# Patient Record
Sex: Female | Born: 1950
Health system: Southern US, Community
[De-identification: ages and names within clinical notes are randomized; demographics above are authoritative.]

## PROBLEM LIST (undated history)

## (undated) DIAGNOSIS — J439 Emphysema, unspecified: Secondary | ICD-10-CM

## (undated) DIAGNOSIS — K219 Gastro-esophageal reflux disease without esophagitis: Secondary | ICD-10-CM

## (undated) DIAGNOSIS — E538 Deficiency of other specified B group vitamins: Secondary | ICD-10-CM

## (undated) DIAGNOSIS — K635 Polyp of colon: Secondary | ICD-10-CM

## (undated) DIAGNOSIS — K5792 Diverticulitis of intestine, part unspecified, without perforation or abscess without bleeding: Secondary | ICD-10-CM

## (undated) DIAGNOSIS — R0902 Hypoxemia: Secondary | ICD-10-CM

## (undated) DIAGNOSIS — K5790 Diverticulosis of intestine, part unspecified, without perforation or abscess without bleeding: Secondary | ICD-10-CM

## (undated) DIAGNOSIS — J40 Bronchitis, not specified as acute or chronic: Secondary | ICD-10-CM

## (undated) DIAGNOSIS — K8020 Calculus of gallbladder without cholecystitis without obstruction: Secondary | ICD-10-CM

## (undated) DIAGNOSIS — E7800 Pure hypercholesterolemia, unspecified: Secondary | ICD-10-CM

## (undated) DIAGNOSIS — C801 Malignant (primary) neoplasm, unspecified: Secondary | ICD-10-CM

## (undated) DIAGNOSIS — E785 Hyperlipidemia, unspecified: Secondary | ICD-10-CM

## (undated) DIAGNOSIS — T7840XA Allergy, unspecified, initial encounter: Secondary | ICD-10-CM

## (undated) DIAGNOSIS — K644 Residual hemorrhoidal skin tags: Secondary | ICD-10-CM

## (undated) DIAGNOSIS — J189 Pneumonia, unspecified organism: Secondary | ICD-10-CM

## (undated) DIAGNOSIS — I351 Nonrheumatic aortic (valve) insufficiency: Secondary | ICD-10-CM

## (undated) DIAGNOSIS — R42 Dizziness and giddiness: Secondary | ICD-10-CM

## (undated) DIAGNOSIS — I2510 Atherosclerotic heart disease of native coronary artery without angina pectoris: Secondary | ICD-10-CM

## (undated) DIAGNOSIS — Z720 Tobacco use: Secondary | ICD-10-CM

## (undated) DIAGNOSIS — T4145XA Adverse effect of unspecified anesthetic, initial encounter: Secondary | ICD-10-CM

## (undated) DIAGNOSIS — K649 Unspecified hemorrhoids: Secondary | ICD-10-CM

## (undated) DIAGNOSIS — H269 Unspecified cataract: Secondary | ICD-10-CM

## (undated) DIAGNOSIS — E049 Nontoxic goiter, unspecified: Secondary | ICD-10-CM

## (undated) DIAGNOSIS — Z5189 Encounter for other specified aftercare: Secondary | ICD-10-CM

## (undated) DIAGNOSIS — IMO0001 Reserved for inherently not codable concepts without codable children: Secondary | ICD-10-CM

## (undated) DIAGNOSIS — J449 Chronic obstructive pulmonary disease, unspecified: Secondary | ICD-10-CM

## (undated) DIAGNOSIS — D649 Anemia, unspecified: Secondary | ICD-10-CM

## (undated) DIAGNOSIS — E611 Iron deficiency: Secondary | ICD-10-CM

## (undated) DIAGNOSIS — M810 Age-related osteoporosis without current pathological fracture: Secondary | ICD-10-CM

## (undated) DIAGNOSIS — I700 Atherosclerosis of aorta: Secondary | ICD-10-CM

## (undated) DIAGNOSIS — T8859XA Other complications of anesthesia, initial encounter: Secondary | ICD-10-CM

## (undated) HISTORY — DX: Unspecified cataract: H26.9

## (undated) HISTORY — DX: Residual hemorrhoidal skin tags: K64.4

## (undated) HISTORY — DX: Allergy, unspecified, initial encounter: T78.40XA

## (undated) HISTORY — PX: WISDOM TOOTH EXTRACTION: SHX21

## (undated) HISTORY — DX: Diverticulosis of intestine, part unspecified, without perforation or abscess without bleeding: K57.90

## (undated) HISTORY — DX: Age-related osteoporosis without current pathological fracture: M81.0

## (undated) HISTORY — PX: APPENDECTOMY: SHX54

## (undated) HISTORY — DX: Gastro-esophageal reflux disease without esophagitis: K21.9

## (undated) HISTORY — DX: Atherosclerosis of aorta: I70.0

## (undated) HISTORY — DX: Nontoxic goiter, unspecified: E04.9

## (undated) HISTORY — DX: Nonrheumatic aortic (valve) insufficiency: I35.1

## (undated) HISTORY — DX: Deficiency of other specified B group vitamins: E53.8

## (undated) HISTORY — DX: Anemia, unspecified: D64.9

## (undated) HISTORY — DX: Hyperlipidemia, unspecified: E78.5

## (undated) HISTORY — DX: Encounter for other specified aftercare: Z51.89

## (undated) HISTORY — PX: NASAL SEPTUM SURGERY: SHX37

## (undated) HISTORY — PX: DENTAL SURGERY: SHX609

## (undated) HISTORY — DX: Calculus of gallbladder without cholecystitis without obstruction: K80.20

## (undated) HISTORY — DX: Iron deficiency: E61.1

## (undated) HISTORY — PX: ABDOMINAL HYSTERECTOMY: SHX81

## (undated) HISTORY — PX: BIOPSY THYROID: PRO38

## (undated) HISTORY — DX: Hypoxemia: R09.02

## (undated) HISTORY — DX: Emphysema, unspecified: J43.9

## (undated) HISTORY — DX: Tobacco use: Z72.0

## (undated) HISTORY — DX: Atherosclerotic heart disease of native coronary artery without angina pectoris: I25.10

## (undated) HISTORY — DX: Malignant (primary) neoplasm, unspecified: C80.1

## (undated) HISTORY — PX: TUBAL LIGATION: SHX77

---

## 1974-10-08 HISTORY — PX: PARTIAL HYSTERECTOMY: SHX80

## 1997-10-08 DIAGNOSIS — E049 Nontoxic goiter, unspecified: Secondary | ICD-10-CM | POA: Insufficient documentation

## 1999-10-09 HISTORY — PX: BILATERAL OOPHORECTOMY: SHX1221

## 2007-08-15 ENCOUNTER — Emergency Department (HOSPITAL_COMMUNITY): Admission: EM | Admit: 2007-08-15 | Discharge: 2007-08-15 | Payer: Self-pay | Admitting: Emergency Medicine

## 2007-08-20 ENCOUNTER — Ambulatory Visit (HOSPITAL_COMMUNITY): Admission: RE | Admit: 2007-08-20 | Discharge: 2007-08-20 | Payer: Self-pay | Admitting: Orthopaedic Surgery

## 2008-03-09 ENCOUNTER — Emergency Department (HOSPITAL_BASED_OUTPATIENT_CLINIC_OR_DEPARTMENT_OTHER): Admission: EM | Admit: 2008-03-09 | Discharge: 2008-03-09 | Payer: Self-pay | Admitting: Emergency Medicine

## 2008-09-07 ENCOUNTER — Ambulatory Visit (HOSPITAL_COMMUNITY): Admission: RE | Admit: 2008-09-07 | Discharge: 2008-09-07 | Payer: Self-pay | Admitting: Internal Medicine

## 2008-09-07 ENCOUNTER — Ambulatory Visit: Payer: Self-pay | Admitting: Internal Medicine

## 2008-09-07 DIAGNOSIS — J449 Chronic obstructive pulmonary disease, unspecified: Secondary | ICD-10-CM | POA: Insufficient documentation

## 2008-09-07 DIAGNOSIS — F17200 Nicotine dependence, unspecified, uncomplicated: Secondary | ICD-10-CM | POA: Insufficient documentation

## 2008-09-07 DIAGNOSIS — R1012 Left upper quadrant pain: Secondary | ICD-10-CM | POA: Insufficient documentation

## 2008-09-07 LAB — CONVERTED CEMR LAB
CO2: 25 meq/L (ref 19–32)
Calcium: 9.6 mg/dL (ref 8.4–10.5)
Chloride: 105 meq/L (ref 96–112)
Creatinine, Ser: 0.55 mg/dL (ref 0.40–1.20)
Eosinophils Relative: 3 % (ref 0–5)
Glucose, Bld: 84 mg/dL (ref 70–99)
HCT: 43.6 % (ref 36.0–46.0)
Hemoglobin: 14.6 g/dL (ref 12.0–15.0)
Lymphocytes Relative: 35 % (ref 12–46)
Lymphs Abs: 2.9 10*3/uL (ref 0.7–4.0)
Monocytes Absolute: 0.7 10*3/uL (ref 0.1–1.0)
Sodium: 141 meq/L (ref 135–145)
Total Bilirubin: 0.4 mg/dL (ref 0.3–1.2)
Total Protein: 7.2 g/dL (ref 6.0–8.3)
WBC: 8.2 10*3/uL (ref 4.0–10.5)

## 2008-09-13 ENCOUNTER — Telehealth (INDEPENDENT_AMBULATORY_CARE_PROVIDER_SITE_OTHER): Payer: Self-pay | Admitting: Internal Medicine

## 2008-09-23 ENCOUNTER — Encounter (INDEPENDENT_AMBULATORY_CARE_PROVIDER_SITE_OTHER): Payer: Self-pay | Admitting: Internal Medicine

## 2008-10-22 ENCOUNTER — Ambulatory Visit: Payer: Self-pay | Admitting: Diagnostic Radiology

## 2008-10-22 ENCOUNTER — Emergency Department (HOSPITAL_BASED_OUTPATIENT_CLINIC_OR_DEPARTMENT_OTHER): Admission: EM | Admit: 2008-10-22 | Discharge: 2008-10-22 | Payer: Self-pay | Admitting: Emergency Medicine

## 2009-01-21 ENCOUNTER — Ambulatory Visit: Payer: Self-pay | Admitting: Internal Medicine

## 2009-01-21 DIAGNOSIS — IMO0002 Reserved for concepts with insufficient information to code with codable children: Secondary | ICD-10-CM | POA: Insufficient documentation

## 2009-01-21 LAB — CONVERTED CEMR LAB
Bilirubin Urine: NEGATIVE
Glucose, Urine, Semiquant: NEGATIVE
Protein, U semiquant: NEGATIVE
Specific Gravity, Urine: <1.005
pH: 7

## 2009-01-27 ENCOUNTER — Encounter (INDEPENDENT_AMBULATORY_CARE_PROVIDER_SITE_OTHER): Payer: Self-pay | Admitting: Internal Medicine

## 2009-01-27 ENCOUNTER — Encounter: Admission: RE | Admit: 2009-01-27 | Discharge: 2009-01-27 | Payer: Self-pay | Admitting: Internal Medicine

## 2009-01-30 DIAGNOSIS — M810 Age-related osteoporosis without current pathological fracture: Secondary | ICD-10-CM | POA: Insufficient documentation

## 2009-01-30 LAB — CONVERTED CEMR LAB
Cholesterol: 274 mg/dL — ABNORMAL HIGH (ref 0–200)
HDL: 50 mg/dL (ref >39)
LDL Cholesterol: 167 mg/dL — ABNORMAL HIGH (ref 0–99)
Triglycerides: 284 mg/dL — ABNORMAL HIGH (ref <150)

## 2009-02-03 ENCOUNTER — Encounter (INDEPENDENT_AMBULATORY_CARE_PROVIDER_SITE_OTHER): Payer: Self-pay | Admitting: Internal Medicine

## 2009-02-22 ENCOUNTER — Ambulatory Visit: Payer: Self-pay | Admitting: Internal Medicine

## 2009-02-25 ENCOUNTER — Ambulatory Visit: Payer: Self-pay | Admitting: Internal Medicine

## 2009-02-25 DIAGNOSIS — K219 Gastro-esophageal reflux disease without esophagitis: Secondary | ICD-10-CM | POA: Insufficient documentation

## 2009-02-25 DIAGNOSIS — E785 Hyperlipidemia, unspecified: Secondary | ICD-10-CM | POA: Insufficient documentation

## 2009-03-08 ENCOUNTER — Ambulatory Visit: Payer: Self-pay | Admitting: *Deleted

## 2009-03-10 ENCOUNTER — Encounter (INDEPENDENT_AMBULATORY_CARE_PROVIDER_SITE_OTHER): Payer: Self-pay | Admitting: Internal Medicine

## 2009-06-17 ENCOUNTER — Ambulatory Visit: Payer: Self-pay | Admitting: Internal Medicine

## 2009-07-06 LAB — CONVERTED CEMR LAB
Cholesterol: 255 mg/dL — ABNORMAL HIGH
HDL: 42 mg/dL
LDL Cholesterol: 138 mg/dL — ABNORMAL HIGH
Total CHOL/HDL Ratio: 6.1
Triglycerides: 373 mg/dL — ABNORMAL HIGH
VLDL: 75 mg/dL — ABNORMAL HIGH

## 2009-07-25 ENCOUNTER — Telehealth (INDEPENDENT_AMBULATORY_CARE_PROVIDER_SITE_OTHER): Payer: Self-pay | Admitting: Internal Medicine

## 2009-08-04 ENCOUNTER — Ambulatory Visit: Payer: Self-pay | Admitting: Internal Medicine

## 2009-08-10 LAB — CONVERTED CEMR LAB: Vit D, 25-Hydroxy: 23 ng/mL — ABNORMAL LOW (ref 30–89)

## 2009-10-05 ENCOUNTER — Ambulatory Visit: Payer: Self-pay | Admitting: Internal Medicine

## 2009-10-09 ENCOUNTER — Ambulatory Visit: Payer: Self-pay | Admitting: Diagnostic Radiology

## 2009-10-09 ENCOUNTER — Emergency Department (HOSPITAL_BASED_OUTPATIENT_CLINIC_OR_DEPARTMENT_OTHER): Admission: EM | Admit: 2009-10-09 | Discharge: 2009-10-09 | Payer: Self-pay | Admitting: Emergency Medicine

## 2009-11-04 LAB — CONVERTED CEMR LAB: LDL Cholesterol: 150 mg/dL — ABNORMAL HIGH (ref 0–99)

## 2009-11-09 ENCOUNTER — Ambulatory Visit: Payer: Self-pay | Admitting: Internal Medicine

## 2009-11-09 DIAGNOSIS — F3289 Other specified depressive episodes: Secondary | ICD-10-CM | POA: Insufficient documentation

## 2009-11-09 DIAGNOSIS — F329 Major depressive disorder, single episode, unspecified: Secondary | ICD-10-CM | POA: Insufficient documentation

## 2009-11-09 DIAGNOSIS — E041 Nontoxic single thyroid nodule: Secondary | ICD-10-CM | POA: Insufficient documentation

## 2009-11-09 DIAGNOSIS — R059 Cough, unspecified: Secondary | ICD-10-CM | POA: Insufficient documentation

## 2009-11-09 DIAGNOSIS — R05 Cough: Secondary | ICD-10-CM

## 2009-11-09 LAB — CONVERTED CEMR LAB: TSH: 0.575 microintl units/mL (ref 0.350–4.500)

## 2009-11-11 ENCOUNTER — Ambulatory Visit (HOSPITAL_COMMUNITY): Admission: RE | Admit: 2009-11-11 | Discharge: 2009-11-11 | Payer: Self-pay | Admitting: Internal Medicine

## 2009-11-24 ENCOUNTER — Telehealth (INDEPENDENT_AMBULATORY_CARE_PROVIDER_SITE_OTHER): Payer: Self-pay | Admitting: Internal Medicine

## 2009-11-29 ENCOUNTER — Ambulatory Visit: Payer: Self-pay | Admitting: Internal Medicine

## 2009-12-07 ENCOUNTER — Telehealth (INDEPENDENT_AMBULATORY_CARE_PROVIDER_SITE_OTHER): Payer: Self-pay | Admitting: Internal Medicine

## 2009-12-16 ENCOUNTER — Ambulatory Visit (HOSPITAL_COMMUNITY): Admission: RE | Admit: 2009-12-16 | Discharge: 2009-12-16 | Payer: Self-pay | Admitting: Internal Medicine

## 2009-12-16 ENCOUNTER — Encounter (INDEPENDENT_AMBULATORY_CARE_PROVIDER_SITE_OTHER): Payer: Self-pay | Admitting: Internal Medicine

## 2009-12-22 ENCOUNTER — Telehealth (INDEPENDENT_AMBULATORY_CARE_PROVIDER_SITE_OTHER): Payer: Self-pay | Admitting: Internal Medicine

## 2010-01-19 ENCOUNTER — Emergency Department (HOSPITAL_BASED_OUTPATIENT_CLINIC_OR_DEPARTMENT_OTHER): Admission: EM | Admit: 2010-01-19 | Discharge: 2010-01-19 | Payer: Self-pay | Admitting: Emergency Medicine

## 2010-01-19 ENCOUNTER — Ambulatory Visit: Payer: Self-pay | Admitting: Diagnostic Radiology

## 2010-02-08 ENCOUNTER — Ambulatory Visit: Payer: Self-pay | Admitting: Internal Medicine

## 2010-02-08 LAB — CONVERTED CEMR LAB
Cholesterol: 258 mg/dL — ABNORMAL HIGH (ref 0–200)
HDL: 38 mg/dL — ABNORMAL LOW (ref >39)
Total CHOL/HDL Ratio: 6.8
Triglycerides: 355 mg/dL — ABNORMAL HIGH (ref <150)

## 2010-03-02 ENCOUNTER — Ambulatory Visit: Payer: Self-pay | Admitting: Internal Medicine

## 2010-03-02 DIAGNOSIS — E559 Vitamin D deficiency, unspecified: Secondary | ICD-10-CM | POA: Insufficient documentation

## 2010-03-02 DIAGNOSIS — M545 Low back pain, unspecified: Secondary | ICD-10-CM | POA: Insufficient documentation

## 2010-03-02 DIAGNOSIS — J309 Allergic rhinitis, unspecified: Secondary | ICD-10-CM | POA: Insufficient documentation

## 2010-03-02 DIAGNOSIS — M76899 Other specified enthesopathies of unspecified lower limb, excluding foot: Secondary | ICD-10-CM | POA: Insufficient documentation

## 2010-03-02 LAB — CONVERTED CEMR LAB: Vit D, 25-Hydroxy: 36 ng/mL (ref 30–89)

## 2010-03-06 ENCOUNTER — Encounter (INDEPENDENT_AMBULATORY_CARE_PROVIDER_SITE_OTHER): Payer: Self-pay | Admitting: Internal Medicine

## 2010-03-08 ENCOUNTER — Encounter (INDEPENDENT_AMBULATORY_CARE_PROVIDER_SITE_OTHER): Payer: Self-pay | Admitting: Internal Medicine

## 2010-03-21 ENCOUNTER — Encounter: Admission: RE | Admit: 2010-03-21 | Discharge: 2010-04-27 | Payer: Self-pay | Admitting: Internal Medicine

## 2010-03-21 ENCOUNTER — Telehealth (INDEPENDENT_AMBULATORY_CARE_PROVIDER_SITE_OTHER): Payer: Self-pay | Admitting: Internal Medicine

## 2010-03-23 ENCOUNTER — Encounter (INDEPENDENT_AMBULATORY_CARE_PROVIDER_SITE_OTHER): Payer: Self-pay | Admitting: Internal Medicine

## 2010-03-29 ENCOUNTER — Ambulatory Visit: Payer: Self-pay | Admitting: Internal Medicine

## 2010-04-20 ENCOUNTER — Ambulatory Visit: Payer: Self-pay | Admitting: Internal Medicine

## 2010-04-27 ENCOUNTER — Encounter (INDEPENDENT_AMBULATORY_CARE_PROVIDER_SITE_OTHER): Payer: Self-pay | Admitting: Internal Medicine

## 2010-05-05 LAB — CONVERTED CEMR LAB
ALT: 12 units/L (ref 0–35)
AST: 15 units/L (ref 0–37)
Alkaline Phosphatase: 57 units/L (ref 39–117)
Cholesterol: 229 mg/dL — ABNORMAL HIGH (ref 0–200)
Indirect Bilirubin: 0.3 mg/dL (ref 0.0–0.9)
LDL Cholesterol: 122 mg/dL — ABNORMAL HIGH (ref 0–99)
Total Protein: 7.9 g/dL (ref 6.0–8.3)
Triglycerides: 315 mg/dL — ABNORMAL HIGH (ref <150)

## 2010-05-09 ENCOUNTER — Ambulatory Visit: Payer: Self-pay | Admitting: Internal Medicine

## 2010-05-15 ENCOUNTER — Telehealth (INDEPENDENT_AMBULATORY_CARE_PROVIDER_SITE_OTHER): Payer: Self-pay | Admitting: Internal Medicine

## 2010-06-08 ENCOUNTER — Ambulatory Visit: Payer: Self-pay | Admitting: Internal Medicine

## 2010-07-05 LAB — CONVERTED CEMR LAB
Albumin: 4.4 g/dL (ref 3.5–5.2)
Alkaline Phosphatase: 55 units/L (ref 39–117)
HDL: 38 mg/dL — ABNORMAL LOW (ref >39)
LDL Cholesterol: 107 mg/dL — ABNORMAL HIGH (ref 0–99)
Total Bilirubin: 0.5 mg/dL (ref 0.3–1.2)

## 2010-08-31 ENCOUNTER — Ambulatory Visit: Payer: Self-pay | Admitting: Diagnostic Radiology

## 2010-08-31 ENCOUNTER — Emergency Department (HOSPITAL_BASED_OUTPATIENT_CLINIC_OR_DEPARTMENT_OTHER)
Admission: EM | Admit: 2010-08-31 | Discharge: 2010-08-31 | Payer: Self-pay | Source: Home / Self Care | Admitting: Emergency Medicine

## 2010-09-19 ENCOUNTER — Encounter (INDEPENDENT_AMBULATORY_CARE_PROVIDER_SITE_OTHER): Payer: Self-pay | Admitting: Internal Medicine

## 2010-09-19 ENCOUNTER — Ambulatory Visit: Payer: Self-pay | Admitting: Internal Medicine

## 2010-09-19 LAB — CONVERTED CEMR LAB
ALT: 11 units/L (ref 0–35)
AST: 18 units/L (ref 0–37)
Albumin: 4.6 g/dL (ref 3.5–5.2)
HDL: 45 mg/dL (ref >39)
Total Bilirubin: 0.5 mg/dL (ref 0.3–1.2)
Total CHOL/HDL Ratio: 4.1

## 2010-10-01 ENCOUNTER — Encounter (INDEPENDENT_AMBULATORY_CARE_PROVIDER_SITE_OTHER): Payer: Self-pay | Admitting: Internal Medicine

## 2010-11-07 NOTE — Progress Notes (Signed)
Summary: Biopsy Results  Phone Note Call from Patient Call back at Home Phone [phone]   Summary of Call: The pt needs to know the results from her biopsy, and she is off today so please call her back. Ola Raap MD Initial call taken by: Manon Hilding,  December 22, 2009 11:09 AM  Follow-up for Phone Call        Fwd to Dr. Delrae Alfred for test results. Follow-up by: Vesta Mixer CMA,  December 22, 2009 11:24 AM  Additional Follow-up for Phone Call Additional follow up Details #1::        pt wants the provider call her back about her results as soon as possible.  Manon Hilding  December 26, 2009 10:52 AM    Additional Follow-up for Phone Call Additional follow up Details #2::    Called --could only leave message--will try to call back on Thursday.  The biopsies were benign, but I want to run her case past an endocrinologist.  Julieanne Manson MD  December 27, 2009 5:35 PM   Called again:  Still plan to speak with pathologist as no specific reading of path--just "findings consistent with non-neoplastic goiter"  but spoke with pt. that we would check her TSH and ultrasound in a year.  She is to call if it ever seems to be enlarging.  States it is smaller now.   Would like her rescue inhaler filled Was in ED yesterday with foot pain-signs with djd of foot.  Will call and make and appt. so can get referred to podiatry.  Julieanne Manson MD  January 21, 2010 4:38 PM   Spoke with Dr. Colonel Bald, pathology, no findings of papillary thyroid cancer.  Called and notified pt.  Julieanne Manson MD  January 31, 2010 3:02 PM   New/Updated Medications: VENTOLIN HFA 108 (90 BASE) MCG/ACT AERS (ALBUTEROL SULFATE) 2 puffs every 4 hours as needed. Prescriptions: VENTOLIN HFA 108 (90 BASE) MCG/ACT AERS (ALBUTEROL SULFATE) 2 puffs every 4 hours as needed.  #1 x 0   Entered and Authorized by:   Julieanne Manson MD   Signed by:   Julieanne Manson MD on 01/21/2010   Method used:   Faxed to ...  [facility] (retail)       [address] [place], [place]  [address]       Ph: [phone]       Fax: [phone]   RxID:   [id number]

## 2010-11-07 NOTE — Letter (Signed)
Summary: *HSN Results Follow up  [facility]  [address] [place], [place] [address]   Phone: [phone]  Fax: [phone]      03/06/2010   [name] [address] [place], [place]  [address]   Dear  Ms. Wilmon Pali,                            ____S.Drinkard,FNP   ____D. Gore,FNP       ____B. McPherson,MD   ____V. Rankins,MD    __X__E. Leodan Bolyard,MD    ____N. Daphine Deutscher, FNP  ____D. Reche Dixon, MD    ____K. Philipp Deputy, MD    ____Other     This letter is to inform you that your recent test(s):  _______Pap Smear    ____X___Lab Test     _______X-ray    ___X____ is within acceptable limits  _______ requires a medication change  _______ requires a follow-up lab visit  _______ requires a follow-up visit with your provider   Comments:  Vitamin D is now in normal range--just continue with 800 International Units  daily      _________________________________________________________ If you have any questions, please contact our office                     Sincerely,  Julieanne Manson MD [facility]

## 2010-11-07 NOTE — Assessment & Plan Note (Signed)
Summary: WART ON FACE/ITS SORE AND BLEEDS//KT   Vital Signs:  Patient profile:   60 year old female Menstrual status:  hysterectomy Weight:      128 pounds Temp:     97.7 degrees F Pulse rate:   94 / minute Pulse rhythm:   regular Resp:     18 per minute BP sitting:   114 / 70  (left arm) Cuff size:   regular  Vitals Entered By: Vesta Mixer CMA (March 29, 2010 10:20 AM) CC: Growth on face above lip for about 3 weeks, last night something fell off of it and it has been bleeding. Is Patient Diabetic? No Pain Assessment Patient in pain? no       Does patient need assistance? Ambulation Normal   CC:  Growth on face above lip for about 3 weeks and last night something fell off of it and it has been bleeding.Marland Kitchen  History of Present Illness: 1.  Bump above left upper lip noted for first time 3 weeks ago--had a long thing hanging out last night and then fell off.  Bleeds easily and is sore.  States she picks at it all the time.    Allergies (verified): 1)  Darvocet 2)  Hydrocodone  Physical Exam  Skin:  Scabbed 4 mm raised lesion--appears mainly skin colored, but cannot tell if a pearly pink top to lesion or not from scabbing   Impression & Recommendations:  Problem # 1:  NEOPLASM,  FACIAL SKIN, UNCERTAIN BEHAVIOR (ICD-238.2) Suspect this may be a seborrheic keratosis, but with symptoms of soreness, quick enlargement and difficulties with current scabbing to fully evaluate, will have derm take a look  Complete Medication List: 1)  Spiriva Handihaler 18 Mcg Caps (Tiotropium bromide monohydrate) .Marland Kitchen.. 1 inhalation daily 2)  Ventolin Hfa 108 (90 Base) Mcg/act Aers (Albuterol sulfate) .... 2 puffs every 4 hours as needed. 3)  Fosamax 70 Mg Tabs (Alendronate sodium) .Marland Kitchen.. 1 tab on empty stomach by mouth once weekly.  no food or meds for 1 hour after and to stay sitting up after as well. 4)  Vitamin D 81191 Unit Caps (Ergocalciferol) .Marland Kitchen.. 1 cap by mouth once weekly for 8 weeks 5)   Pepcid 40 Mg Tabs (Famotidine) .Marland Kitchen.. 1 tab by mouth daily 6)  Calcium Citrate 200 Mg Tabs (Calcium citrate) .... 3 tabs by mouth two times a day 7)  Ventolin Hfa 108 (90 Base) Mcg/act Aers (Albuterol sulfate) .... Two puffs q4h as needed for cough or shortness of breath 8)  Zolpidem Tartrate 10 Mg Tabs (Zolpidem tartrate) .... 1/2 to 1 tab by mouth at bedtime for sleep 9)  Xyzal 5 Mg Tabs (Levocetirizine dihydrochloride) .Marland Kitchen.. 1 tab by mouth daily for allergies 10)  Nasacort Aq 55 Mcg/act Aers (Triamcinolone acetonide(nasal)) .... 2 sprays each nostril daily 11)  Diclofenac Potassium 50 Mg Tabs (Diclofenac potassium) .Marland Kitchen.. 1 tab by mouth two times a day with food as needed pain 12)  Pravastatin Sodium 40 Mg Tabs (Pravastatin sodium) .Marland Kitchen.. 1 tab by mouth daily with meal  Patient Instructions: 1)  Call if you do not hear from dermatology clinic. 2)  Please schedule for [facility]--new lesion on left upper lip/nasolabial fold.  Pt. also has some other lesions would like dermatologist to look at.

## 2010-11-07 NOTE — Assessment & Plan Note (Signed)
Summary: 3 MONTH FU//KT   Vital Signs:  Patient profile:   60 year old female Menstrual status:  hysterectomy Weight:      124.4 pounds Temp:     97.9 degrees F Pulse rate:   80 / minute Pulse rhythm:   regular Resp:     18 per minute BP sitting:   106 / 67  (left arm) Cuff size:   regular  Vitals Entered By: Vesta Mixer CMA (Mar 02, 2010 10:40 AM) CC: f/u on blood work,   she has seen Marchelle Folks Is Patient Diabetic? No Pain Assessment Patient in pain? no       Does patient need assistance? Ambulation Normal   CC:  f/u on blood work and she has seen [place].  History of Present Illness: 1.  Hyperlipidemia:  Tried lifestyle changes.  Cholesterol panel without real improvement.  Willing to try medication.  Discussed Pravastatin 40 mg. and side effects.  2.  Cough:  has just about subsided.  Did not note much difference if used rescue inhaler.  Having scratchy throat --nasal burning if goes outside after lawn mowed.  Hx.  of seasonal allergies.  Has been on Nasonex with benefit in past.  Using otc Allegra currently.  3.  Thyroid nodule--benign--see phone note.  Plan for yearly ultrasound  4.  Depression:  went to [facility] once.  Feels she has a support system and does not need counseling.  5.  Hips and lateral thighs hurt--difficult to get out of bed in morning.  Has overdone it recently.  Low back sometimes makes it worse.  Hurts to lie on either side.    Allergies (verified): 1)  Darvocet 2)  Hydrocodone  Physical Exam  General:  NAD Head:  Tender over maxillary sinuses Ears:  External ear exam shows no significant lesions or deformities.  Otoscopic examination reveals clear canals, tympanic membranes are intact bilaterally without bulging, retraction, inflammation or discharge. Hearing is grossly normal bilaterally. Nose:  nasal dischargemucosal pallor.   Mouth:  pharynx pink and moist.   Lungs:  Decreased BS throughout.  No crackles or wheeze Heart:  Normal  rate and regular rhythm. S1 and S2 normal without gallop, murmur, click, rub or other extra sounds. Msk:  Tender over lumbar paraspinous musculature, not over spinous processes Extremities:  Tender over greater trochanter bilaterally Neurologic:  strength normal in all extremities and DTRs symmetrical and normal.     Impression & Recommendations:  Problem # 1:  COUGH (ICD-786.2) Now with allergies compounding this, though sounds like is improving  Problem # 2:  THYROID NODULE, RIGHT (ICD-241.0) Ultrasound in 1 year  Problem # 3:  HYPERLIPIDEMIA (ICD-272.4)  Start Pravastatin  Her updated medication list for this problem includes:    Pravastatin Sodium 40 Mg Tabs (Pravastatin sodium) .Marland Kitchen... 1 tab by mouth daily with meal  Problem # 4:  TROCHANTERIC BURSITIS, BILATERAL (ICD-726.5)  Diclofenac and PT  Orders: Physical Therapy Referral (PT)  Problem # 5:  BACK PAIN, LUMBAR (ICD-724.2)  Her updated medication list for this problem includes:    Diclofenac Potassium 50 Mg Tabs (Diclofenac potassium) .Marland Kitchen... 1 tab by mouth two times a day with food as needed pain  Orders: Physical Therapy Referral (PT)  Problem # 6:  ALLERGIC RHINITIS (ICD-477.9) Start meds Her updated medication list for this problem includes:    Xyzal 5 Mg Tabs (Levocetirizine dihydrochloride) .Marland Kitchen... 1 tab by mouth daily for allergies    Nasacort Aq 55 Mcg/act Aers (Triamcinolone acetonide(nasal)) .Marland KitchenMarland KitchenMarland KitchenMarland Kitchen  2 sprays each nostril daily  Complete Medication List: 1)  Spiriva Handihaler 18 Mcg Caps (Tiotropium bromide monohydrate) .Marland Kitchen.. 1 inhalation daily 2)  Ventolin Hfa 108 (90 Base) Mcg/act Aers (Albuterol sulfate) .... 2 puffs every 4 hours as needed. 3)  Fosamax 70 Mg Tabs (Alendronate sodium) .Marland Kitchen.. 1 tab on empty stomach by mouth once weekly.  no food or meds for 1 hour after and to stay sitting up after as well. 4)  Vitamin D 52841 Unit Caps (Ergocalciferol) .Marland Kitchen.. 1 cap by mouth once weekly for 8 weeks 5)  Pepcid 40  Mg Tabs (Famotidine) .Marland Kitchen.. 1 tab by mouth daily 6)  Calcium Citrate 200 Mg Tabs (Calcium citrate) .... 3 tabs by mouth two times a day 7)  Ventolin Hfa 108 (90 Base) Mcg/act Aers (Albuterol sulfate) .... Two puffs q4h as needed for cough or shortness of breath 8)  Zolpidem Tartrate 10 Mg Tabs (Zolpidem tartrate) .... 1/2 to 1 tab by mouth at bedtime for sleep 9)  Xyzal 5 Mg Tabs (Levocetirizine dihydrochloride) .Marland Kitchen.. 1 tab by mouth daily for allergies 10)  Nasacort Aq 55 Mcg/act Aers (Triamcinolone acetonide(nasal)) .... 2 sprays each nostril daily 11)  Diclofenac Potassium 50 Mg Tabs (Diclofenac potassium) .Marland Kitchen.. 1 tab by mouth two times a day with food as needed pain 12)  Pravastatin Sodium 40 Mg Tabs (Pravastatin sodium) .Marland Kitchen.. 1 tab by mouth daily with meal  Patient Instructions: 1)  Follow up with fasting labs in 6 weeks for FLP, liver Prescriptions: PRAVASTATIN SODIUM 40 MG TABS (PRAVASTATIN SODIUM) 1 tab by mouth daily with meal  #30 x 2   Entered and Authorized by:   Julieanne Manson MD   Signed by:   Julieanne Manson MD on 03/02/2010   Method used:   Faxed to ...       [facility] (retail)       [address] [place], [place]  [address]       Ph: [phone]       Fax: [phone]   RxID:   [id number] DICLOFENAC POTASSIUM 50 MG TABS (DICLOFENAC POTASSIUM) 1 tab by mouth two times a day with food as needed pain  #60 x 2   Entered and Authorized by:   Julieanne Manson MD   Signed by:   Julieanne Manson MD on 03/02/2010   Method used:   Electronically to        [facility] [phone]* (retail)       [address]       [place], [place]  [address]       Ph: [phone]       Fax: [phone]   RxID:   [id number] NASACORT AQ 55 MCG/ACT AERS (TRIAMCINOLONE ACETONIDE(NASAL)) 2 sprays each nostril daily  #1 x 11   Entered and Authorized by:   Julieanne Manson MD   Signed by:   Julieanne Manson MD on 03/02/2010    Method used:   Faxed to ...       [facility] (retail)       [address] [place], [place]  [address]       Ph: [phone]       Fax: [phone]   RxID:   [id number] XYZAL 5 MG TABS (LEVOCETIRIZINE DIHYDROCHLORIDE) 1 tab by mouth daily for allergies  #30 x 11   Entered and Authorized by:   Julieanne Manson  MD   Signed by:   Julieanne Manson MD on 03/02/2010   Method used:   Faxed to ...       [facility] (retail)       [address] [place], [place]  [address]       Ph: [phone]       Fax: [phone]   RxID:   [id number]   Appended Document: Orders Update pt. brings up her Vitamin D level.  did not follow up after last go around with 50,000 units weekly for 8 weeks back in November   Clinical Lists Changes  Problems: Added new problem of UNSPECIFIED VITAMIN D DEFICIENCY (ICD-268.9) Orders: Added new Test order of T- * Misc. Laboratory test [phone]) - Signed

## 2010-11-07 NOTE — Letter (Signed)
Summary: [facility]  [facility]   Imported By: Arta Bruce 05/24/2010 09:51:17  _____________________________________________________________________  External Attachment:    Type:   Image     Comment:   External Document

## 2010-11-07 NOTE — Miscellaneous (Signed)
Summary: Rehab Report/INITIAL SUMMARY  Rehab Report/INITIAL SUMMARY   Imported By: Arta Bruce 05/01/2010 08:41:12  _____________________________________________________________________  External Attachment:    Type:   Image     Comment:   External Document

## 2010-11-07 NOTE — Progress Notes (Signed)
Summary: REFILLS REQUEST  Phone Note Call from Patient   Summary of Call: THE PT NEEDS MORE REFILLS FROM [facility]; SHE GOES TO [facility] [phone] MULBERRY MD  Initial call taken by: Manon Hilding,  May 15, 2010 11:37 AM    Prescriptions: VENTOLIN HFA 108 (90 BASE) MCG/ACT AERS (ALBUTEROL SULFATE) 2 puffs every 4 hours as needed.  #1 x 0   Entered by:   Dutch Quint RN   Authorized by:   Julieanne Manson MD   Signed by:   Dutch Quint RN on 05/15/2010   Method used:   Electronically to        [facility] [phone]* (retail)       [address] [place], [place]  [address]       Ph: [phone]       Fax: [phone]   RxID:   [id number]

## 2010-11-07 NOTE — Letter (Signed)
Summary: REFERRAL/PHYSICAL THERAPY  REFERRAL/PHYSICAL THERAPY   Imported By: Arta Bruce 03/08/2010 09:56:00  _____________________________________________________________________  External Attachment:    Type:   Image     Comment:   External Document

## 2010-11-07 NOTE — Letter (Signed)
Summary: CYTOPATHOLOGY REPORT  CYTOPATHOLOGY REPORT   Imported By: Arta Bruce 03/17/2010 15:26:21  _____________________________________________________________________  External Attachment:    Type:   Image     Comment:   External Document

## 2010-11-07 NOTE — Miscellaneous (Signed)
Summary: Rehab Report//INITIAL SUMMARY  Rehab Report//INITIAL SUMMARY   Imported By: Arta Bruce 03/24/2010 10:08:19  _____________________________________________________________________  External Attachment:    Type:   Image     Comment:   External Document

## 2010-11-07 NOTE — Progress Notes (Signed)
Summary: Biopsy  Phone Note Call from Patient   Summary of Call: received call from [facility] at [facility] radiology this pt is scheduled for an biopsy on Friday 12/16/09 @ 2:00 Initial call taken by: Levon Hedger,  December 07, 2009 9:19 AM  Follow-up for Phone Call        Left message on answering machine for pt to return call at [phone]. Follow-up by: Vesta Mixer CMA,  December 07, 2009 11:29 AM  Additional Follow-up for Phone Call Additional follow up Details #1::        Left message on answering machine for pt to return call  Additional Follow-up by: Vesta Mixer CMA,  December 14, 2009 12:30 PM    Additional Follow-up for Phone Call Additional follow up Details #2::    PATIENT WAS ALREADY AWARE OF THE APPT. THE HOSP CALLED HER YESTERDAY. Follow-up by: Leodis Rains,  December 15, 2009 10:24 AM

## 2010-11-07 NOTE — Letter (Signed)
Summary: REPORT OF CYTOPATHOLOGY  REPORT OF CYTOPATHOLOGY   Imported By: Arta Bruce 03/22/2010 11:32:55  _____________________________________________________________________  External Attachment:    Type:   Image     Comment:   External Document

## 2010-11-07 NOTE — Progress Notes (Signed)
Summary: [facility]   Phone Note From Other Clinic   Caller: Nurse Call For: [facility] PHYSICAL [facility]ERAP[facility]  Request: Talk with Provider Summary of Call: [facility]  SAW TODAY MS [place] AND SHE WANTS TO KNOW IF SHE CAN DO I[place]TO IN HER HIP .  PLEASE, CALL HER BACK @ [phone]  THANK YOU . Initial call taken by: Cheryll Dessert,  March 21, 2010 3:06 PM  Follow-up for Phone Call        Please let her know that would be fine. Follow-up by: Julieanne Manson MD,  March 21, 2010 6:28 PM  Additional Follow-up for Phone Call Additional follow up Details #1::        Nikia at PT notified. Additional Follow-up by: Vesta Mixer CMA,  March 22, 2010 11:30 AM

## 2010-11-07 NOTE — Progress Notes (Signed)
Summary: test results  Phone Note Call from Patient Call back at Home Phone [phone]   Summary of Call: Pt needs the results from her ultrasound and blood work.  Please call her back. Desmin Daleo MD  Initial call taken by: Manon Hilding,  November 24, 2009 3:48 PM  Follow-up for Phone Call        Fwd to provider for test results. Follow-up by: Vesta Mixer CMA,  November 24, 2009 4:30 PM  Additional Follow-up for Phone Call Additional follow up Details #1::        Spoke with patient regarding her thyroid nodules--please shedule her with interventional radiology for FNA of both right thyroid nodules.    Discussed will have to decide if anything further needs to be done depending on biopsy report. Additional Follow-up by: Julieanne Manson MD,  November 30, 2009 10:13 AM  New Problems: THYROID NODULES, RIGHT (ICD-241.0)   Additional Follow-up for Phone Call Additional follow up Details #2::    Referral sent to Candi Leash at [facility]. Follow-up by: Vesta Mixer CMA,  December 02, 2009 4:57 PM  New Problems: THYROID NODULES, RIGHT (ICD-241.0)

## 2010-11-07 NOTE — Assessment & Plan Note (Signed)
Summary: discuss starting on meds for her high cholesterol//gk   Vital Signs:  Patient profile:   60 year old female Menstrual status:  hysterectomy Weight:      122.5 pounds BMI:     22.49 O2 Sat:      95 % Temp:     98.0 degrees F oral Pulse rate:   88 / minute Pulse rhythm:   regular Resp:     19 per minute BP sitting:   101 / 63  (right arm) Cuff size:   regular  Vitals Entered By: Geanie Cooley  (November 09, 2009 11:09 AM) CC: Pt here for labs result for cholesterol check. pt states she had a upper respirtory infection from the flu she had earlier in Jan. Pt states she still has a cough and white foam comes out when she coughs. She didnt have the white foam when she had the flu, this came afterwards. Is Patient Diabetic? No Pain Assessment Patient in pain? no       Does patient need assistance? Functional Status Self care Ambulation Normal   CC:  Pt here for labs result for cholesterol check. pt states she had a upper respirtory infection from the flu she had earlier in Jan. Pt states she still has a cough and white foam comes out when she coughs. She didnt have the white foam when she had the flu and this came afterwards..  History of Present Illness: 1.  Still with a cough--dry.  Sometimes brings up white foamy mucous.  Coughs a lot when gets overheated--usually at work.  Prior to getting ill with the flu end of December, had not had such a significant cough previously.  Using Spiriva.  Has not tried rescue inhaler to see if helps when has a coughing paroxysm.    2.  Hypercholesterolemia:  Pt. has not been taking care of herself.  Husband underwent surgery for cancer.  Started radiation.  Died 2023/08/03 from CHF.  Oldest grandson killed at age 27--hit by a train in [place] earlier in the fall.  Not eating well, not sleeping well.  Prior to this, pt. had cut down to almost no cigarettes and was eating well.  Has lost weight.  Is thinking about going to speak with a pastor.   Has not been able to go to Hospice group meetings.  Allergies (verified): 1)  Darvocet 2)  Hydrocodone  Physical Exam  General:  Tearful at times. Eyes:  No conjunctival injection Ears:  External ear exam shows no significant lesions or deformities.  Otoscopic examination reveals clear canals, tympanic membranes are intact bilaterally without bulging, retraction, inflammation or discharge. Hearing is grossly normal bilaterally. Mouth:  pharynx pink and moist.   Neck:  Right thyroid nodule Lungs:  Normal respiratory effort, chest expands symmetrically. Lungs are clear to auscultation, no crackles or wheezes. Heart:  Normal rate and regular rhythm. S1 and S2 normal without gallop, murmur, click, rub or other extra sounds.  Radial pulses normal and equal   Impression & Recommendations:  Problem # 1:  HYPERLIPIDEMIA (ICD-272.4) Pt. would like to work on lifestyle before considering medication.  Problem # 2:  THYROID NODULE, RIGHT (ICD-241.0)  Orders: T-TSH (44010-27253) Ultrasound (Ultrasound)  Problem # 3:  DEPRESSION (ICD-311) No medication for now.  Both of [place] feel counseling would be beneficial with losses and stressors currently. Consider meds if no improvement. Orders: Psychology Referral (Psychology)  Problem # 4:  COUGH (ICD-786.2) Though no wheezing currently, recommended trying Rescue inhaler.  If no improvement, to call  Complete Medication List: 1)  Spiriva Handihaler 18 Mcg Caps (Tiotropium bromide monohydrate) .Marland Kitchen.. 1 inhalation daily 2)  Chantix Starting Month Pak 0.5 Mg X 11 & 1 Mg X 42 Misc (Varenicline tartrate) .... Start 0.5 mg daily for 1st 3 days, then two times a day for 3 days, then increase to 1 mg two times a day thereafter. 3)  Chantix 1 Mg Tabs (Varenicline tartrate) .Marland Kitchen.. 1 tab by mouth two times a day --start after completing starte pack 4)  Ventolin Hfa 108 (90 Base) Mcg/act Aers (Albuterol sulfate) 5)  Fosamax 70 Mg Tabs (Alendronate sodium) .Marland Kitchen..  1 tab on empty stomach by mouth once weekly.  no food or meds for 1 hour after and to stay sitting up after as well. 6)  Vitamin D 36644 Unit Caps (Ergocalciferol) .Marland Kitchen.. 1 cap by mouth once weekly for 8 weeks 7)  Pepcid 40 Mg Tabs (Famotidine) .Marland Kitchen.. 1 tab by mouth daily 8)  Calcium Citrate 200 Mg Tabs (Calcium citrate) .... 3 tabs by mouth two times a day 9)  Ventolin Hfa 108 (90 Base) Mcg/act Aers (Albuterol sulfate) .... Two puffs q4h as needed for cough or shortness of breath 10)  Zolpidem Tartrate 10 Mg Tabs (Zolpidem tartrate) .... 1/2 to 1 tab by mouth at bedtime for sleep  Other Orders: Flu Vaccine 63yrs + [id number]) Admin 1st Vaccine (25956) Admin 1st Vaccine [facility]) [phone])  Patient Instructions: 1)  Referral to [facility]--counseling 2)  Follow up with Dr. Delrae Alfred in 3 months --please schedule FL[facility]--lab visit--2 days before OV with me Prescriptions: ZOLPIDEM TARTRATE 10 MG TABS (ZOLPIDEM TARTRATE) 1/2 to 1 tab by mouth at bedtime for sleep  #15 x 0   Entered and Authorized by:   Julieanne Manson MD   Signed by:   Julieanne Manson MD on 11/09/2009   Method used:   Print then Give to Patient   RxID:   [id number] ZOLPIDEM TARTRATE 10 MG TABS (ZOLPIDEM TARTRATE) 1/2 to 1 tab by mouth at bedtime for sleep  #15 x 0   Entered and Authorized by:   Julieanne Manson MD   Signed by:   Julieanne Manson MD on 11/09/2009   Method used:   Print then Give to Patient   RxID:   [id number]    Vital Signs:  Patient profile:   60 year old female Menstrual status:  hysterectomy Weight:      122.5 pounds BMI:     22.49 O2 Sat:      95 % Temp:     98.0 degrees F oral Pulse rate:   88 / minute Pulse rhythm:   regular Resp:     19 per minute BP sitting:   101 / 63  (right arm) Cuff size:   regular  Vitals Entered By: Geanie Cooley  (November 09, 2009 11:09 AM)    Orders Added: 1)  Flu Vaccine 36yrs + [25427] 2)  Admin 1st Vaccine [90471] 3)  Admin 1st Vaccine  (State) [90471S] 4)  T-TSH [06237-62831] 5)  Psychology Referral [Psychology] 6)  Ultrasound [Ultrasound] 7)  Est. Patient Level IV [51761]    Influenza Vaccine    Vaccine Type: Fluvax 3+    Site: left deltoid    Mfr: Sanofi Pasteur    Dose: 0.5 ml    Route: IM    Given by: Geanie Cooley     Exp. Date: 04/06/2010    Lot #: [id number]    VIS given: 05/01/07  version given November 09, 2009.  Flu Vaccine Consent Questions    Do you have a history of severe allergic reactions to this vaccine? no    Any prior history of allergic reactions to egg and/or gelatin? no    Do you have a sensitivity to the preservative Thimersol? no    Do you have a past history of Guillan-Barre Syndrome? no    Do you currently have an acute febrile illness? no    Have you ever had a severe reaction to latex? no    Vaccine information given and explained to patient? yes    Are you currently pregnant? no

## 2010-11-09 NOTE — Letter (Signed)
Summary: PSYCHOLOGY REFERRAL//NO SHOW  PSYCHOLOGY REFERRAL//NO SHOW   Imported By: Arta Bruce 10/16/2010 10:07:26  _____________________________________________________________________  External Attachment:    Type:   Image     Comment:   External Document

## 2010-11-09 NOTE — Letter (Signed)
Summary: Lipid Letter  [facility]  [address] [place], [place] [address]   Phone: [phone]  Fax: [phone]    10/01/2010  Margaret Henry [address] [place], [place]  [address]  Dear Margaret Henry:  We have carefully reviewed your last lipid profile from 09/19/2010 and the results are noted below with a summary of recommendations for lipid management.    Cholesterol:       183     Goal: <200   HDL "good" Cholesterol:   45     Goal: >45   LDL "bad" Cholesterol:   89     Goal: <100   Triglycerides:       246     Goal: <150    Cholesterol is much better with switch to Lipitor.  Keep working on diet and exercise and stay on current dose of Lipitor.  Recommend rechecking in 6 months.  Would like to see Triglycerides lower.    TLC Diet (Therapeutic Lifestyle Change): Saturated Fats & Transfatty acids should be kept < 7% of total calories ***Reduce Saturated Fats Polyunstaurated Fat can be up to 10% of total calories Monounsaturated Fat Fat can be up to 20% of total calories Total Fat should be no greater than 25-35% of total calories Carbohydrates should be 50-60% of total calories Protein should be approximately 15% of total calories Fiber should be at least 20-30 grams a day ***Increased fiber may help lower LDL Total Cholesterol should be < 200mg /day Consider adding plant stanol/sterols to diet (example: Benacol spread) ***A higher intake of unsaturated fat may reduce Triglycerides and Increase HDL    Adjunctive Measures (may lower LIPIDS and reduce risk of Heart Attack) include: Aerobic Exercise (20-30 minutes 3-4 times a week) Limit Alcohol Consumption Weight Reduction Aspirin 75-81 mg a day by mouth (if not allergic or contraindicated) Dietary Fiber 20-30 grams a day by mouth     Current Medications: 1)    Spiriva Handihaler 18 Mcg Caps (Tiotropium bromide monohydrate) .Marland Kitchen.. 1 inhalation daily 2)    Ventolin Hfa 108 (90  Base) Mcg/act Aers (Albuterol sulfate) .... 2 puffs every 4 hours as needed. 3)    Fosamax 70 Mg Tabs (Alendronate sodium) .Marland Kitchen.. 1 tab on empty stomach by mouth once weekly.  no food or meds for 1 hour after and to stay sitting up after as well. 4)    Vitamin D 57846 Unit Caps (Ergocalciferol) .Marland Kitchen.. 1 cap by mouth once weekly for 8 weeks 5)    Pepcid 40 Mg Tabs (Famotidine) .Marland Kitchen.. 1 tab by mouth daily 6)    Calcium Citrate 200 Mg Tabs (Calcium citrate) .... 3 tabs by mouth two times a day 7)    Ventolin Hfa 108 (90 Base) Mcg/act Aers (Albuterol sulfate) .... Two puffs q4h as needed for cough or shortness of breath 8)    Zolpidem Tartrate 10 Mg Tabs (Zolpidem tartrate) .... 1/2 to 1 tab by mouth at bedtime for sleep 9)    Xyzal 5 Mg Tabs (Levocetirizine dihydrochloride) .Marland Kitchen.. 1 tab by mouth daily for allergies 10)    Nasacort Aq 55 Mcg/act Aers (Triamcinolone acetonide(nasal)) .... 2 sprays each nostril daily 11)    Diclofenac Potassium 50 Mg Tabs (Diclofenac potassium) .Marland Kitchen.. 1 tab by mouth two times a day with food as needed pain 12)    Lipitor 40 Mg Tabs (Atorvastatin calcium) .Marland Kitchen.. 1 tab by mouth daily  If you have any questions, please call. We  appreciate being able to work with you.   Sincerely,    [facility] Julieanne Manson MD

## 2010-12-19 LAB — BASIC METABOLIC PANEL
Calcium: 9.5 mg/dL (ref 8.4–10.5)
Chloride: 108 mEq/L (ref 96–112)
Creatinine, Ser: 0.6 mg/dL (ref 0.4–1.2)
GFR calc Af Amer: >60 mL/min (ref >60)
GFR calc non Af Amer: >60 mL/min (ref >60)

## 2010-12-19 LAB — POCT CARDIAC MARKERS
Myoglobin, poc: 25.5 ng/mL (ref 12–200)
Troponin i, poc: <0.05 ng/mL (ref 0.00–0.09)

## 2010-12-19 LAB — CBC
Platelets: 220 10*3/uL (ref 150–400)
RBC: 4.6 MIL/uL (ref 3.87–5.11)
WBC: 9.4 10*3/uL (ref 4.0–10.5)

## 2010-12-19 LAB — DIFFERENTIAL
Lymphocytes Relative: 31 % (ref 12–46)
Lymphs Abs: 2.9 10*3/uL (ref 0.7–4.0)
Neutrophils Relative %: 59 % (ref 43–77)

## 2010-12-22 ENCOUNTER — Telehealth (INDEPENDENT_AMBULATORY_CARE_PROVIDER_SITE_OTHER): Payer: Self-pay | Admitting: Internal Medicine

## 2011-01-09 NOTE — Progress Notes (Signed)
Summary: REFILL ON ALBUTEROL  Phone Note Call from Patient Call back at Home Phone [phone]   Reason for Call: Refill Medication Summary of Call: Margaret Henry PT. MS Simones CALLED AND SAYS THAT SHE NEEDS A REFILL ON HER ALBUTEROL INHALER. SHE USES [facility] IN [place]. Initial call taken by: Leodis Rains,  December 22, 2010 3:41 PM  Follow-up for Phone Call        Last seen 03/2010, no f/u.  Refill albuterol per protocol? Follow-up by: Dutch Quint RN,  December 25, 2010 4:38 PM  Additional Follow-up for Phone Call Additional follow up Details #1::        pat calls today still not refilled, please call pat to let know status Additional Follow-up by: Ernestine Mcmurray,  December 26, 2010 3:56 PM    Additional Follow-up for Phone Call Additional follow up Details #2::    There are 2 [facility] in [place] her know which one I sent it to She needs an OV  Julieanne Manson MD  December 28, 2010 5:56 AM   Left message on answer machine for pt. to return call. Gaylyn Cheers RN  December 28, 2010 11:06 AM   Has lost eligibility -- is working and gets [facility], over the limit.  Has to pay full price for meds.  If needs dr., has to go to ED.  Says she can get by with inhaler for now, until "I figure out what to do."  Advised to call [facility] to see if they know of any resources that she could utilize.  Dutch Quint RN  December 28, 2010 2:45 PM    Prescriptions: VENTOLIN HFA 108 (90 BASE) MCG/ACT AERS (ALBUTEROL SULFATE) 2 puffs every 4 hours as needed.  #1 x 0   Entered and Authorized by:   Julieanne Manson MD   Signed by:   Julieanne Manson MD on 12/28/2010   Method used:   Electronically to        [facility]  [address] [phone]* (retail)       [address]       [place], [place]  [address]       Ph: [phone]       Fax: [phone]   RxID:   [id number]

## 2011-05-25 ENCOUNTER — Other Ambulatory Visit: Payer: Self-pay | Admitting: Internal Medicine

## 2011-05-25 ENCOUNTER — Ambulatory Visit (HOSPITAL_COMMUNITY)
Admission: RE | Admit: 2011-05-25 | Discharge: 2011-05-25 | Disposition: A | Discharge status: Home / Self Care | Payer: Self-pay | Source: Ambulatory Visit | Attending: Internal Medicine | Admitting: Internal Medicine

## 2011-05-25 DIAGNOSIS — R52 Pain, unspecified: Secondary | ICD-10-CM

## 2011-05-25 DIAGNOSIS — M545 Low back pain, unspecified: Secondary | ICD-10-CM | POA: Insufficient documentation

## 2011-11-21 ENCOUNTER — Ambulatory Visit (HOSPITAL_BASED_OUTPATIENT_CLINIC_OR_DEPARTMENT_OTHER): Payer: Self-pay

## 2012-03-04 ENCOUNTER — Encounter (HOSPITAL_BASED_OUTPATIENT_CLINIC_OR_DEPARTMENT_OTHER): Payer: Self-pay | Admitting: *Deleted

## 2012-03-04 ENCOUNTER — Emergency Department (HOSPITAL_BASED_OUTPATIENT_CLINIC_OR_DEPARTMENT_OTHER)
Admission: EM | Admit: 2012-03-04 | Discharge: 2012-03-04 | Disposition: A | Discharge status: Home / Self Care | Payer: Self-pay | Attending: Emergency Medicine | Admitting: Emergency Medicine

## 2012-03-04 ENCOUNTER — Emergency Department (HOSPITAL_BASED_OUTPATIENT_CLINIC_OR_DEPARTMENT_OTHER): Payer: Self-pay

## 2012-03-04 DIAGNOSIS — F17200 Nicotine dependence, unspecified, uncomplicated: Secondary | ICD-10-CM | POA: Insufficient documentation

## 2012-03-04 DIAGNOSIS — Z79899 Other long term (current) drug therapy: Secondary | ICD-10-CM | POA: Insufficient documentation

## 2012-03-04 DIAGNOSIS — E7800 Pure hypercholesterolemia, unspecified: Secondary | ICD-10-CM | POA: Insufficient documentation

## 2012-03-04 DIAGNOSIS — J441 Chronic obstructive pulmonary disease with (acute) exacerbation: Secondary | ICD-10-CM | POA: Insufficient documentation

## 2012-03-04 DIAGNOSIS — J40 Bronchitis, not specified as acute or chronic: Secondary | ICD-10-CM

## 2012-03-04 HISTORY — DX: Pure hypercholesterolemia, unspecified: E78.00

## 2012-03-04 HISTORY — DX: Bronchitis, not specified as acute or chronic: J40

## 2012-03-04 HISTORY — DX: Chronic obstructive pulmonary disease, unspecified: J44.9

## 2012-03-04 MED ORDER — AZITHROMYCIN 250 MG PO TABS: 250.0000 mg | ORAL_TABLET | Freq: Every day | ORAL | Status: AC

## 2012-03-04 MED ORDER — PREDNISONE 10 MG PO TABS: ORAL_TABLET | ORAL | Status: DC

## 2012-03-04 MED ORDER — IPRATROPIUM BROMIDE 0.02 % IN SOLN
0.5000 mg | Freq: Once | RESPIRATORY_TRACT | Status: AC
  Administered 2012-03-04: 0.5 mg via RESPIRATORY_TRACT
  Filled 2012-03-04 (×2): qty 2.5

## 2012-03-04 MED ORDER — ALBUTEROL SULFATE HFA 108 (90 BASE) MCG/ACT IN AERS: 1.0000 | INHALATION_SPRAY | Freq: Four times a day (QID) | RESPIRATORY_TRACT | Status: DC | PRN

## 2012-03-04 MED ORDER — AZITHROMYCIN 250 MG PO TABS: 250.0000 mg | ORAL_TABLET | Freq: Every day | ORAL | Status: DC

## 2012-03-04 MED ORDER — ALBUTEROL SULFATE (5 MG/ML) 0.5% IN NEBU
5.0000 mg | INHALATION_SOLUTION | Freq: Once | RESPIRATORY_TRACT | Status: AC
  Administered 2012-03-04: 5 mg via RESPIRATORY_TRACT
  Filled 2012-03-04: qty 1

## 2012-03-04 MED ORDER — PREDNISONE 50 MG PO TABS
60.0000 mg | ORAL_TABLET | Freq: Once | ORAL | Status: AC
  Administered 2012-03-04: 60 mg via ORAL
  Filled 2012-03-04: qty 1

## 2012-03-04 NOTE — Discharge Instructions (Signed)

## 2012-03-04 NOTE — ED Provider Notes (Signed)
Medical screening examination/treatment/procedure(s) were performed by non-physician practitioner and as supervising physician I was immediately available for consultation/collaboration.   Rolan Bucco, MD 03/04/12 [phone]

## 2012-03-04 NOTE — ED Provider Notes (Signed)
History     CSN: [id number]  Arrival date & time 03/04/12  1737   First MD Initiated Contact with Patient 03/04/12 1804      No chief complaint on file.   (Consider location/radiation/quality/duration/timing/severity/associated sxs/prior treatment) Patient is a 61 y.o. female presenting with cough. The history is provided by the patient. No language interpreter was used.  Cough This is a new problem. The current episode started 6 to 12 hours ago. The problem occurs constantly. The problem has been gradually worsening. The cough is non-productive. There has been no fever. Associated symptoms include shortness of breath. The treatment provided no relief. She is a smoker. Her past medical history is significant for bronchitis, COPD and asthma.  Pt reports she became short of breath while she was outside today.  Pt reports difficulty working due to shortness of breath.  Pt reports she gets short of breath walking from car to building.  Pt is a smoker.   Pt has an empty proventil inhaler  Past Medical History  Diagnosis Date  . COPD (chronic obstructive pulmonary disease)   . Bronchitis   . High cholesterol     Past Surgical History  Procedure Date  . Abdominal hysterectomy     No family history on file.  History  Substance Use Topics  . Smoking status: Current Everyday Smoker -- 0.5 packs/day  . Smokeless tobacco: Current User  . Alcohol Use: No    OB History    Grav Para Term Preterm Abortions TAB SAB Ect Mult Living                  Review of Systems  Respiratory: Positive for cough and shortness of breath.   All other systems reviewed and are negative.    Allergies  Hydrocodone and Propoxyphene-acetaminophen  Home Medications   Current Outpatient Rx  Name Route Sig Dispense Refill  . ACETAMINOPHEN 500 MG PO TABS Oral Take 1,500 mg by mouth every 4 (four) hours as needed. For headache    . ALBUTEROL SULFATE HFA 108 (90 BASE) MCG/ACT IN AERS Inhalation Inhale 2  puffs into the lungs 3 (three) times daily as needed. For shortness of breath    . LORATADINE 10 MG PO TABS Oral Take 10 mg by mouth daily.    . MOMETASONE FUROATE 50 MCG/ACT NA SUSP Nasal Place 2 sprays into the nose daily.    . ADULT MULTIVITAMIN W/MINERALS CH Oral Take 1 tablet by mouth daily.    Marland Kitchen OVER THE COUNTER MEDICATION Oral Take 1 capsule by mouth daily. Megared omega 3 and krill oil    . SIMVASTATIN 80 MG PO TABS Oral Take 80 mg by mouth at bedtime.    Marland Kitchen TIOTROPIUM BROMIDE MONOHYDRATE 18 MCG IN CAPS Inhalation Place 18 mcg into inhaler and inhale daily.      BP 147/86  Pulse 120  Temp(Src) 98.6 F (37 C) (Oral)  Resp 16  SpO2 96%  Physical Exam  Nursing note and vitals reviewed. Constitutional: She appears well-developed and well-nourished.  HENT:  Head: Normocephalic and atraumatic.  Right Ear: External ear normal.  Left Ear: External ear normal.  Nose: Nose normal.  Mouth/Throat: Oropharynx is clear and moist.  Eyes: Conjunctivae and EOM are normal. Pupils are equal, round, and reactive to light.  Neck: Normal range of motion. Neck supple.  Cardiovascular: Normal rate, regular rhythm and normal heart sounds.   Pulmonary/Chest: She has wheezes.  Abdominal: Soft.  Musculoskeletal: Normal range of motion.  Neurological:  She is alert.  Skin: Skin is warm.  Psychiatric: She has a normal mood and affect.   ED Course  Procedures (including critical care time)  Labs Reviewed - No data to display Dg Chest 2 View  03/04/2012  *RADIOLOGY REPORT*  Clinical Data: Shortness of breath for 1 day.  COPD.  CHEST - 2 VIEW  Comparison: 08/31/2010.  Findings: Normal heart size with clear lung fields.  Calcified tortuous aorta.  No effusion or pneumothorax.  Bones unremarkable.  IMPRESSION: No active cardiopulmonary disease.  No change from priors.  Original Report Authenticated By: Elsie Stain, M.D.     No diagnosis found.    MDM  Pt given albuterol neb,  No pneumonia on  chest xray.   Pt given prednisone.   Pt advised to follow up with her MD.   Pt treated for COpd exacerbation.   Pt given rx for albuterol, prednisone and zithromax        Lonia Skinner [place], [place] 03/04/12 2024

## 2012-03-04 NOTE — ED Notes (Signed)
Hx of bronchitis and COPD. Was outside and got sob today. Drove herself here.

## 2012-03-19 ENCOUNTER — Other Ambulatory Visit (HOSPITAL_COMMUNITY): Payer: Self-pay | Admitting: Internal Medicine

## 2012-03-19 DIAGNOSIS — R42 Dizziness and giddiness: Secondary | ICD-10-CM

## 2012-03-28 ENCOUNTER — Ambulatory Visit (HOSPITAL_COMMUNITY)
Admission: RE | Admit: 2012-03-28 | Discharge: 2012-03-28 | Disposition: A | Discharge status: Home / Self Care | Payer: Self-pay | Source: Ambulatory Visit | Attending: Internal Medicine | Admitting: Internal Medicine

## 2012-03-28 ENCOUNTER — Other Ambulatory Visit (HOSPITAL_COMMUNITY): Payer: Self-pay | Admitting: Internal Medicine

## 2012-03-28 DIAGNOSIS — R42 Dizziness and giddiness: Secondary | ICD-10-CM | POA: Insufficient documentation

## 2012-03-28 DIAGNOSIS — H538 Other visual disturbances: Secondary | ICD-10-CM | POA: Insufficient documentation

## 2012-09-29 ENCOUNTER — Encounter (HOSPITAL_COMMUNITY): Payer: Self-pay

## 2012-09-29 ENCOUNTER — Emergency Department (HOSPITAL_COMMUNITY)
Admission: EM | Admit: 2012-09-29 | Discharge: 2012-09-29 | Disposition: A | Discharge status: Home / Self Care | Payer: Self-pay | Source: Home / Self Care

## 2012-09-29 DIAGNOSIS — F17200 Nicotine dependence, unspecified, uncomplicated: Secondary | ICD-10-CM

## 2012-09-29 DIAGNOSIS — E041 Nontoxic single thyroid nodule: Secondary | ICD-10-CM

## 2012-09-29 DIAGNOSIS — E049 Nontoxic goiter, unspecified: Secondary | ICD-10-CM

## 2012-09-29 DIAGNOSIS — J309 Allergic rhinitis, unspecified: Secondary | ICD-10-CM

## 2012-09-29 DIAGNOSIS — M810 Age-related osteoporosis without current pathological fracture: Secondary | ICD-10-CM

## 2012-09-29 DIAGNOSIS — J42 Unspecified chronic bronchitis: Secondary | ICD-10-CM

## 2012-09-29 DIAGNOSIS — E559 Vitamin D deficiency, unspecified: Secondary | ICD-10-CM

## 2012-09-29 DIAGNOSIS — E785 Hyperlipidemia, unspecified: Secondary | ICD-10-CM

## 2012-09-29 MED ORDER — ALBUTEROL SULFATE HFA 108 (90 BASE) MCG/ACT IN AERS: 2.0000 | INHALATION_SPRAY | RESPIRATORY_TRACT | Status: DC | PRN

## 2012-09-29 MED ORDER — MOMETASONE FUROATE 50 MCG/ACT NA SUSP: 2.0000 | Freq: Every day | NASAL | Status: DC

## 2012-09-29 MED ORDER — TIOTROPIUM BROMIDE MONOHYDRATE 18 MCG IN CAPS: 18.0000 ug | ORAL_CAPSULE | Freq: Every day | RESPIRATORY_TRACT | Status: DC

## 2012-09-29 MED ORDER — LORATADINE 10 MG PO TABS: 10.0000 mg | ORAL_TABLET | Freq: Every day | ORAL | Status: DC

## 2012-09-29 NOTE — ED Notes (Signed)
Medication refill-has history of bronchitis, copd

## 2012-09-29 NOTE — Discharge Instructions (Signed)
Allergies, Generic  Allergies may happen from anything your body is sensitive to. This may be food, medicines, pollens, chemicals, and nearly anything around you in everyday life that produces allergens. An allergen is anything that causes an allergy producing substance. Heredity is often a factor in causing these problems. This means you may have some of the same allergies as your parents.  Food allergies happen in all age groups. Food allergies are some of the most severe and life threatening. Some common food allergies are cow's milk, seafood, eggs, nuts, wheat, and soybeans.  SYMPTOMS    Swelling around the mouth.   An itchy red rash or hives.   Vomiting or diarrhea.   Difficulty breathing.  SEVERE ALLERGIC REACTIONS ARE LIFE-THREATENING.  This reaction is called anaphylaxis. It can cause the mouth and throat to swell and cause difficulty with breathing and swallowing. In severe reactions only a trace amount of food (for example, peanut oil in a salad) may cause death within seconds.  Seasonal allergies occur in all age groups. These are seasonal because they usually occur during the same season every year. They may be a reaction to molds, grass pollens, or tree pollens. Other causes of problems are house dust mite allergens, pet dander, and mold spores. The symptoms often consist of nasal congestion, a runny itchy nose associated with sneezing, and tearing itchy eyes. There is often an associated itching of the mouth and ears. The problems happen when you come in contact with pollens and other allergens. Allergens are the particles in the air that the body reacts to with an allergic reaction. This causes you to release allergic antibodies. Through a chain of events, these eventually cause you to release histamine into the blood stream. Although it is meant to be protective to the body, it is this release that causes your discomfort. This is why you were given anti-histamines to feel better. If you are  unable to pinpoint the offending allergen, it may be determined by skin or blood testing. Allergies cannot be cured but can be controlled with medicine.  Hay fever is a collection of all or some of the seasonal allergy problems. It may often be treated with simple over-the-counter medicine such as diphenhydramine. Take medicine as directed. Do not drink alcohol or drive while taking this medicine. Check with your caregiver or package insert for child dosages.  If these medicines are not effective, there are many new medicines your caregiver can prescribe. Stronger medicine such as nasal spray, eye drops, and corticosteroids may be used if the first things you try do not work well. Other treatments such as immunotherapy or desensitizing injections can be used if all else fails. Follow up with your caregiver if problems continue. These seasonal allergies are usually not life threatening. They are generally more of a nuisance that can often be handled using medicine.  HOME CARE INSTRUCTIONS    If unsure what causes a reaction, keep a diary of foods eaten and symptoms that follow. Avoid foods that cause reactions.   If hives or rash are present:   Take medicine as directed.   You may use an over-the-counter antihistamine (diphenhydramine) for hives and itching as needed.   Apply cold compresses (cloths) to the skin or take baths in cool water. Avoid hot baths or showers. Heat will make a rash and itching worse.   If you are severely allergic:   Following a treatment for a severe reaction, hospitalization is often required for closer follow-up.     Wear a medic-alert bracelet or necklace stating the allergy.   You and your family must learn how to give adrenaline or use an anaphylaxis kit.   If you have had a severe reaction, always carry your anaphylaxis kit or EpiPen with you. Use this medicine as directed by your caregiver if a severe reaction is occurring. Failure to do so could have a fatal outcome.  SEEK  MEDICAL CARE IF:   You suspect a food allergy. Symptoms generally happen within 30 minutes of eating a food.   Your symptoms have not gone away within 2 days or are getting worse.   You develop new symptoms.   You want to retest yourself or your child with a food or drink you think causes an allergic reaction. Never do this if an anaphylactic reaction to that food or drink has happened before. Only do this under the care of a caregiver.  SEEK IMMEDIATE MEDICAL CARE IF:    You have difficulty breathing, are wheezing, or have a tight feeling in your chest or throat.   You have a swollen mouth, or you have hives, swelling, or itching all over your body.   You have had a severe reaction that has responded to your anaphylaxis kit or an EpiPen. These reactions may return when the medicine has worn off. These reactions should be considered life threatening.  MAKE SURE YOU:    Understand these instructions.   Will watch your condition.   Will get help right away if you are not doing well or get worse.  Document Released: 12/18/2002 Document Revised: 12/17/2011 Document Reviewed: 05/24/2008  ExitCare Patient Information 2013 [employer]

## 2012-09-29 NOTE — ED Provider Notes (Signed)
Patient Demographics  Margaret Henry, is a 61 y.o. female  ZOX:[id number]  [id number]:[id number]  DOB - 1950-12-21  Chief Complaint  Patient presents with  . Medication Refill        Subjective:   Margaret Henry today is here to establish primary care. Patient has No headache, No chest pain, No abdominal pain - No Nausea, No new weakness tingling or numbness, No Cough. Patient has a history of COPD, tobacco abuse, multiple allergies, osteoporosis, dyslipidemia. She continues to use Spiriva and albuterol on a daily basis. She unfortunately continues to smoke, claims that she is cut down significantly and now a pack of cigarettes lasts for 3 days. She previously was taking simvastatin for dyslipidemia, however she claims that it made her not sleep, she does not want to go on a statin unless absolutely necessary. 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  Objective:    Filed Vitals:   09/29/12 1633  BP: 136/76  Pulse: 101  Temp: 98.2 F (36.8 C)  TempSrc: Oral  Resp: 18  SpO2: 94%     ALLERGIES:   Allergies  Allergen Reactions  . Hydrocodone Nausea And Vomiting  . Propoxyphene-Acetaminophen Nausea And Vomiting    PAST MEDICAL HISTORY: Past Medical History  Diagnosis Date  . COPD (chronic obstructive pulmonary disease)   . Bronchitis   . High cholesterol     PAST SURGICAL HISTORY: Past Surgical History  Procedure Date  . Abdominal hysterectomy     FAMILY HISTORY: No family history on file.  MEDICATIONS AT HOME: Prior to Admission medications   Medication Sig Start Date End Date Taking? Authorizing Provider  acetaminophen (TYLENOL) 500 MG tablet Take 1,500 mg by mouth every 4 (four) hours as needed. For headache    Historical Provider, MD  albuterol (PROVENTIL HFA;VENTOLIN HFA) 108 (90 BASE) MCG/ACT inhaler Inhale 2 puffs into  the lungs every 4 (four) hours as needed for wheezing or shortness of breath. For shortness of breath 09/29/12   Maretta Bees, MD  loratadine (CLARITIN) 10 MG tablet Take 1 tablet (10 mg total) by mouth daily. 09/29/12   Hartleigh Edmonston Levora Dredge, MD  mometasone (NASONEX) 50 MCG/ACT nasal spray Place 2 sprays into the nose daily. 09/29/12   Siddarth Hsiung Levora Dredge, MD  Multiple Vitamin (MULITIVITAMIN WITH MINERALS) TABS Take 1 tablet by mouth daily.    Historical Provider, MD  OVER THE COUNTER MEDICATION Take 1 capsule by mouth daily. Megared omega 3 and krill oil    Historical Provider, MD  tiotropium (SPIRIVA) 18 MCG inhalation capsule Place 1 capsule (18 mcg total) into inhaler and inhale daily. 09/29/12   Ubaldo Daywalt Levora Dredge, MD    REVIEW OF SYSTEMS:  Constitutional:   No   Fevers, chills, fatigue.  HEENT:    No headaches, Sore throat,   Cardio-vascular: No chest pain,  Orthopnea, swelling in lower extremities, anasarca, palpitations  GI:  No abdominal pain, nausea, vomiting, diarrhea  Resp: No shortness of breath,  No coughing up of blood.No cough.No wheezing.  Skin:  no rash or lesions.  GU:  no dysuria, change in color of urine, no urgency or frequency.  No flank pain.  Musculoskeletal: No joint pain or swelling.  No decreased range of motion.  No back pain.  Psych: No change in mood or affect. No depression or anxiety.  No memory loss.  Exam  General appearance :Awake, alert, not in any distress. Speech Clear. Not toxic Looking HEENT: Atraumatic and Normocephalic, pupils equally reactive to light and accomodation Neck: supple, no JVD. No cervical lymphadenopathy.  Chest:Good air entry bilaterally, no added sounds  CVS: S1 S2 regular, no murmurs.  Abdomen: Bowel sounds present, Non tender and not distended with no gaurding, rigidity or rebound. Extremities: B/L Lower Ext shows no edema, both legs are warm to touch Neurology: Awake alert, and oriented X 3, CN II-XII intact,  Non focal Skin:No Rash Wounds:N/A    Data Review   CBC No results found for this basename: WBC:5,HGB:5,HCT:5,PLT:5,MCV:5,MCH:5,MCHC:5,RDW:5,NEUTRABS:5,LYMPHSABS:5,MONOABS:5,EOSABS:5,BASOSABS:5,BANDABS:5,BANDSABD:5 in the last 168 hours  Chemistries   No results found for this basename: NA:5,K:5,CL:5,CO2:5,GLUCOSE:5,BUN:5,CREATININE:5,GFRCGP,:5,CALCIUM:5,MG:5,AST:5,ALT:5,ALKPHOS:5,BILITOT:5 in the last 168 hours ------------------------------------------------------------------------------------------------------------------ No results found for this basename: HGBA1C:2 in the last 72 hours ------------------------------------------------------------------------------------------------------------------ No results found for this basename: CHOL:2,HDL:2,LDLCALC:2,TRIG:2,CHOLHDL:2,LDLDIRECT:2 in the last 72 hours ------------------------------------------------------------------------------------------------------------------ No results found for this basename: TSH,T4TOTAL,FREET3,T3FREE,THYROIDAB in the last 72 hours ------------------------------------------------------------------------------------------------------------------ No results found for this basename: VITAMINB12:2,FOLATE:2,FERRITIN:2,TIBC:2,IRON:2,RETICCTPCT:2 in the last 72 hours  Coagulation profile  No results found for this basename: INR:5,PROTIME:5 in the last 168 hours    Assessment & Plan   COPD - Seems stable. - Continue with albuterol inhaler as needed -Continue with Spiriva - She needs to quit smoking, however at the same time she needs to decide in the motivated about quitting-I do not think she is day yet. We will need to re counsel her on her in subsequent visits.  Tobacco abuse - I counseled her extensively.-See above  Allergic rhinitis - Continue with loratadine, Nasonex nasal spray.  Dyslipidemia - She claims that simvastatin gave her insomnia, I will check a fasting lipid panel before considering  restarting statins. Patient is agreeable  Thyroid nodule - Claims to have had a thyroid nodule in the past, claims that she had a thyroid biopsy that was benign. - Will check a TSH  Osteoporosis - Patient takes over-the-counter calcium and vitamin D supplements - Dexa scan will need to be considered in subsequent visits, if she is compliant with followup visits.  She has had a flu shot already this year.  She will followup in a month and a half, hopefully she will have labs prior to next visit.  Follow-up Information    Follow up with [facility]. Schedule an appointment as soon as possible for a visit in 6 weeks.          Maretta Bees, MD 09/29/12 1710

## 2012-11-24 ENCOUNTER — Emergency Department (INDEPENDENT_AMBULATORY_CARE_PROVIDER_SITE_OTHER)
Admission: EM | Admit: 2012-11-24 | Discharge: 2012-11-24 | Disposition: A | Discharge status: Home / Self Care | Payer: Self-pay | Source: Home / Self Care

## 2012-11-24 DIAGNOSIS — J309 Allergic rhinitis, unspecified: Secondary | ICD-10-CM

## 2012-11-24 DIAGNOSIS — E785 Hyperlipidemia, unspecified: Secondary | ICD-10-CM

## 2012-11-24 DIAGNOSIS — E049 Nontoxic goiter, unspecified: Secondary | ICD-10-CM

## 2012-11-24 DIAGNOSIS — J42 Unspecified chronic bronchitis: Secondary | ICD-10-CM

## 2012-11-24 LAB — HEMOGLOBIN A1C: Mean Plasma Glucose: 117 mg/dL — ABNORMAL HIGH (ref <117)

## 2012-11-24 LAB — COMPREHENSIVE METABOLIC PANEL
CO2: 26 mEq/L (ref 19–32)
Calcium: 10.3 mg/dL (ref 8.4–10.5)
Creatinine, Ser: 0.58 mg/dL (ref 0.50–1.10)
GFR calc Af Amer: >90 mL/min (ref >90)
GFR calc non Af Amer: >90 mL/min (ref >90)
Glucose, Bld: 91 mg/dL (ref 70–99)
Sodium: 142 mEq/L (ref 135–145)
Total Protein: 8.4 g/dL — ABNORMAL HIGH (ref 6.0–8.3)

## 2012-11-24 LAB — CBC
Hemoglobin: 16.8 g/dL — ABNORMAL HIGH (ref 12.0–15.0)
MCH: 33.1 pg (ref 26.0–34.0)
MCHC: 35.7 g/dL (ref 30.0–36.0)
MCV: 92.7 fL (ref 78.0–100.0)
Platelets: 231 10*3/uL (ref 150–400)
RBC: 5.07 MIL/uL (ref 3.87–5.11)

## 2012-11-24 LAB — LIPID PANEL
Cholesterol: 277 mg/dL — ABNORMAL HIGH (ref 0–200)
Triglycerides: 229 mg/dL — ABNORMAL HIGH (ref <150)

## 2012-11-24 MED ORDER — ALBUTEROL SULFATE HFA 108 (90 BASE) MCG/ACT IN AERS: 2.0000 | INHALATION_SPRAY | RESPIRATORY_TRACT | Status: DC | PRN

## 2012-11-24 NOTE — ED Notes (Signed)
Patient here for lab work only  Cbc,cmp,tsh,lipid panel and hgb A1C

## 2012-11-25 ENCOUNTER — Telehealth (HOSPITAL_COMMUNITY): Payer: Self-pay

## 2012-11-25 NOTE — Progress Notes (Signed)
Quick Note:  Please notify patient and her blood sugar came back elevated. Her hemoglobin A1c was elevated suggesting that she has prediabetes. Please mail the patient information about prediabetes, diet, physical activity suggestions. Also, the patient's cholesterol is elevated. She likely needs to restart cholesterol medications. She can discuss that when she returns to the clinic. Also her hemoglobin and hematocrit was elevated. I would like to have her recheck her CBC in the next month. I would also like for her to come in for her next office visit in the next month. Her thyroid tests came back within normal limits.  Rodney Langton, MD, CDE, FAAFP [facility] [place] [facility], [place]   ______

## 2013-02-27 ENCOUNTER — Telehealth: Payer: Self-pay

## 2013-02-27 NOTE — Telephone Encounter (Signed)
Pt scheduled 5/30

## 2013-03-06 ENCOUNTER — Ambulatory Visit: Payer: No Typology Code available for payment source | Attending: Family Medicine | Admitting: Family Medicine

## 2013-03-06 VITALS — BP 134/74 | HR 91 | Temp 98.0°F | Resp 16

## 2013-03-06 DIAGNOSIS — Z87891 Personal history of nicotine dependence: Secondary | ICD-10-CM | POA: Insufficient documentation

## 2013-03-06 DIAGNOSIS — E7800 Pure hypercholesterolemia, unspecified: Secondary | ICD-10-CM | POA: Insufficient documentation

## 2013-03-06 DIAGNOSIS — R7309 Other abnormal glucose: Secondary | ICD-10-CM | POA: Insufficient documentation

## 2013-03-06 DIAGNOSIS — J441 Chronic obstructive pulmonary disease with (acute) exacerbation: Secondary | ICD-10-CM | POA: Insufficient documentation

## 2013-03-06 MED ORDER — TIOTROPIUM BROMIDE MONOHYDRATE 18 MCG IN CAPS: 18.0000 ug | ORAL_CAPSULE | Freq: Every day | RESPIRATORY_TRACT | Status: DC

## 2013-03-06 MED ORDER — ALBUTEROL SULFATE HFA 108 (90 BASE) MCG/ACT IN AERS: 2.0000 | INHALATION_SPRAY | RESPIRATORY_TRACT | Status: DC | PRN

## 2013-03-06 MED ORDER — FLUTICASONE-SALMETEROL 100-50 MCG/DOSE IN AEPB: 1.0000 | INHALATION_SPRAY | Freq: Two times a day (BID) | RESPIRATORY_TRACT | Status: DC

## 2013-03-06 NOTE — Progress Notes (Signed)
Patient states here for follow up Last labs showed her as prediabetic Cholesterol was high

## 2013-03-06 NOTE — Progress Notes (Signed)
Subjective:     Patient ID: Margaret Henry, female   DOB: August 25, 1951, 62 y.o.   MRN: [id number]  HPI Pt here for refill on inhalers.  She has a h/o copd. Stopped smoking 1 month ago. Uses albuterol and spiriva. Needs inhaler most mornings and 6-8 times daily due to coughing. Does not have chest tightness. No shob at this time.   She realizes she was supposed to return a few mos ago due to lab abnormalities - elevated a1c, h/h, and cholesterol. She says since that time she has lost weight and is doing her best to eat healthier. As mentioned she also quit smoking. We discussed the meaning of these labs in detail. She would like to try lifestyle modification before medication if possible and i think this is reasonable.    Review of Systems per hpi     Objective:   Physical Exam  Nursing note and vitals reviewed. Constitutional: She appears well-developed and well-nourished.  Cardiovascular: Normal rate, regular rhythm and normal heart sounds.   Pulmonary/Chest: Effort normal.  Scattered wheezes. Good air movement  Psychiatric: She has a normal mood and affect.       Assessment:     COPD exacerbation - Plan: albuterol (PROVENTIL HFA;VENTOLIN HFA) 108 (90 BASE) MCG/ACT inhaler, tiotropium (SPIRIVA) 18 MCG inhalation capsule, Fluticasone-Salmeterol (ADVAIR) 100-50 MCG/DOSE AEPB       Plan:     1 - copd - wonderful that she has quit smoking! Have refilled her inhalers and started advair. Will see her back in 1 month to address lungs ONLY. Want to see how she is doing on the advair, hopefully she will be using albuterol less.   2 - Cholesterol - elevated on recent flp. With smoking cessation, weight loss, and healthier eating, this may improve. Will check flp in 5-6 mos to re-eval. May still need statin. Suggest oatmeal.   3 - elevated h/h - suspect due to having been a smoker. Will repeat in a few mos.   4 - elevated a1c - prediabetes. She does have info on this and has been losing  weight and tring to modify her eating. Will reasses in a few mos.   rtc 1 mo for lungs, 3-4 mos for chronic med probs and labs. Earlier if needed.

## 2013-04-06 ENCOUNTER — Ambulatory Visit: Payer: BC Managed Care – PPO | Attending: Family Medicine | Admitting: Family Medicine

## 2013-04-06 ENCOUNTER — Encounter: Payer: Self-pay | Admitting: Family Medicine

## 2013-04-06 VITALS — BP 135/72 | HR 80 | Temp 98.3°F | Resp 20 | Ht 64.0 in | Wt 119.8 lb

## 2013-04-06 DIAGNOSIS — E785 Hyperlipidemia, unspecified: Secondary | ICD-10-CM

## 2013-04-06 DIAGNOSIS — K219 Gastro-esophageal reflux disease without esophagitis: Secondary | ICD-10-CM

## 2013-04-06 DIAGNOSIS — R05 Cough: Secondary | ICD-10-CM

## 2013-04-06 DIAGNOSIS — F17200 Nicotine dependence, unspecified, uncomplicated: Secondary | ICD-10-CM

## 2013-04-06 DIAGNOSIS — J42 Unspecified chronic bronchitis: Secondary | ICD-10-CM

## 2013-04-06 DIAGNOSIS — R059 Cough, unspecified: Secondary | ICD-10-CM

## 2013-04-06 DIAGNOSIS — L570 Actinic keratosis: Secondary | ICD-10-CM

## 2013-04-06 DIAGNOSIS — H8110 Benign paroxysmal vertigo, unspecified ear: Secondary | ICD-10-CM

## 2013-04-06 NOTE — Patient Instructions (Addendum)

## 2013-04-06 NOTE — Progress Notes (Signed)
Patient ID: Margaret Henry, female   DOB: July 24, 1951, 62 y.o.   MRN: [id number]  CC: follow up   HPI: Pt says that she is using the advair and breathing better.  She is still smoking but trying to quit smoking.  Pt says that she is having some sun burn skin lesions on chest wall.  She is also having night time vertigo and sensation of room spinning. She has been treated for this in the past with meclizine but not recently.    Allergies  Allergen Reactions  . Hydrocodone Nausea And Vomiting  . Propoxyphene-Acetaminophen Nausea And Vomiting   Past Medical History  Diagnosis Date  . COPD (chronic obstructive pulmonary disease)   . Bronchitis   . High cholesterol    Current Outpatient Prescriptions on File Prior to Visit  Medication Sig Dispense Refill  . acetaminophen (TYLENOL) 500 MG tablet Take 1,500 mg by mouth every 4 (four) hours as needed. For headache      . albuterol (PROVENTIL HFA;VENTOLIN HFA) 108 (90 BASE) MCG/ACT inhaler Inhale 2 puffs into the lungs every 4 (four) hours as needed for wheezing or shortness of breath. For shortness of breath  1 Inhaler  4  . Fluticasone-Salmeterol (ADVAIR) 100-50 MCG/DOSE AEPB Inhale 1 puff into the lungs 2 (two) times daily.  1 each  3  . loratadine (CLARITIN) 10 MG tablet Take 1 tablet (10 mg total) by mouth daily.  30 tablet  3  . mometasone (NASONEX) 50 MCG/ACT nasal spray Place 2 sprays into the nose daily.  17 g  3  . Multiple Vitamin (MULITIVITAMIN WITH MINERALS) TABS Take 1 tablet by mouth daily.      Marland Kitchen OVER THE COUNTER MEDICATION Take 1 capsule by mouth daily. Megared omega 3 and krill oil      . tiotropium (SPIRIVA) 18 MCG inhalation capsule Place 1 capsule (18 mcg total) into inhaler and inhale daily.  30 capsule  3  . [DISCONTINUED] simvastatin (ZOCOR) 80 MG tablet Take 80 mg by mouth at bedtime.       No current facility-administered medications on file prior to visit.   No family history on file. History   Social History  .  Marital Status: Widowed    Spouse Name: N/A    Number of Children: N/A  . Years of Education: N/A   Occupational History  . Not on file.   Social History Main Topics  . Smoking status: Current Every Day Smoker -- 0.50 packs/day  . Smokeless tobacco: Current User  . Alcohol Use: No  . Drug Use: No  . Sexually Active:    Other Topics Concern  . Not on file   Social History Narrative  . No narrative on file    Review of Systems  Constitutional: Negative for fever, chills, diaphoresis, activity change, appetite change and fatigue.  HENT: Negative for ear pain, nosebleeds, congestion, facial swelling, rhinorrhea, neck pain, neck stiffness and ear discharge.   Eyes: Negative for pain, discharge, redness, itching and visual disturbance.  Respiratory: shortness of breath   Cardiovascular: Negative for chest pain, palpitations and leg swelling.  Gastrointestinal: Negative for abdominal distention.  Genitourinary: Negative for dysuria, urgency, frequency, hematuria, flank pain, decreased urine volume, difficulty urinating and dyspareunia.  Musculoskeletal: Negative for back pain, joint swelling, arthralgias and gait problem.  Neurological: Negative for dizziness, tremors, seizures, syncope, facial asymmetry, speech difficulty, weakness, light-headedness, numbness and headaches.  Hematological: Negative for adenopathy. Does not bruise/bleed easily.  Psychiatric/Behavioral: Negative for hallucinations,  behavioral problems, confusion, dysphoric mood, decreased concentration and agitation.    Objective:   Filed Vitals:   04/06/13 1007  BP: 135/72  Pulse: 80  Temp: 98.3 F (36.8 C)  Resp: 20    Physical Exam  Constitutional: Appears well-developed and well-nourished. No distress.  HENT: Normocephalic. External right and left ear normal. Oropharynx is clear and moist.  Eyes: Conjunctivae and EOM are normal. PERRLA, no scleral icterus.  Neck: Normal ROM. Neck supple. No JVD. No  tracheal deviation. No thyromegaly.  CVS: RRR, S1/S2 +, no murmurs, no gallops, no carotid bruit.  Pulmonary: Effort and breath sounds normal, no stridor, rhonchi, rare exp. wheezes heard.  Abdominal: Soft. BS +,  no distension, tenderness, rebound or guarding.  Musculoskeletal: Normal range of motion. No edema and no tenderness.  Lymphadenopathy: No lymphadenopathy noted, cervical, inguinal. Neuro: Alert. Normal reflexes, muscle tone coordination. No cranial nerve deficit. Skin: multiple actinic keratoses, sun damaged skin noted Psychiatric: Normal mood and affect. Behavior, judgment, thought content normal.   Lab Results  Component Value Date   WBC 8.3 11/24/2012   HGB 16.8* 11/24/2012   HCT 47.0* 11/24/2012   MCV 92.7 11/24/2012   PLT 231 11/24/2012   Lab Results  Component Value Date   CREATININE 0.58 11/24/2012   BUN 8 11/24/2012   NA 142 11/24/2012   K 4.4 11/24/2012   CL 104 11/24/2012   CO2 26 11/24/2012    Lab Results  Component Value Date   HGBA1C 5.7* 11/24/2012   Lipid Panel     Component Value Date/Time   CHOL 277* 11/24/2012 1020   TRIG 229* 11/24/2012 1020   HDL 49 11/24/2012 1020   CHOLHDL 5.7 11/24/2012 1020   VLDL 46* 11/24/2012 1020   LDLCALC 182* 11/24/2012 1020       Assessment and plan:   Patient Active Problem List   Diagnosis Date Noted  . UNSPECIFIED VITAMIN D DEFICIENCY 03/02/2010  . ALLERGIC RHINITIS 03/02/2010  . BACK PAIN, LUMBAR 03/02/2010  . TROCHANTERIC BURSITIS, BILATERAL 03/02/2010  . THYROID NODULE, RIGHT 11/09/2009  . DEPRESSION 11/09/2009  . COUGH 11/09/2009  . HYPERLIPIDEMIA 02/25/2009  . GERD 02/25/2009  . OSTEOPOROSIS 01/30/2009  . COMPRESSION FRACTURE, SPINE 01/21/2009  . TOBACCO ABUSE 09/07/2008  . BRONCHITIS, CHRONIC 09/07/2008  . LUQ PAIN 09/07/2008  . GOITER, UNSPECIFIED 10/08/1997   Refer to derm for comprehensive skin evaluation and treatment of actinic keratoses  Referral to PT for vestibular rehab  Smoking cessation  strongly advised.  Follow up in 2 months  The patient was given clear instructions to go to ER or return to medical center if symptoms don't improve, worsen or new problems develop.  The patient verbalized understanding.  The patient was told to call to get any lab results if not heard anything in the next week.     Rodney Langton, MD, CDE, FAAFP [facility] [place], [place]

## 2013-04-06 NOTE — Progress Notes (Signed)
Pt here today for f/u. Hx COPD, Chronic bronchitis. Was prescribed Advair x 1 mnth ago by Dr. Joseph Art. States the med is working , but noticed increased yellow sputum production. Denies CP. Sats 94% r/a

## 2013-05-07 ENCOUNTER — Other Ambulatory Visit: Payer: Self-pay

## 2013-05-26 ENCOUNTER — Telehealth: Payer: Self-pay | Admitting: Emergency Medicine

## 2013-05-26 NOTE — Telephone Encounter (Signed)
Rx request refill  Ventolin HFA faxed from Pharm. Pt need to schedule appt for f/u- [facility] RN

## 2013-06-01 ENCOUNTER — Ambulatory Visit: Payer: BC Managed Care – PPO | Attending: Family Medicine | Admitting: Internal Medicine

## 2013-06-01 ENCOUNTER — Encounter: Payer: Self-pay | Admitting: Internal Medicine

## 2013-06-01 VITALS — BP 149/76 | HR 69 | Temp 97.0°F | Resp 16 | Wt 120.0 lb

## 2013-06-01 DIAGNOSIS — J441 Chronic obstructive pulmonary disease with (acute) exacerbation: Secondary | ICD-10-CM | POA: Insufficient documentation

## 2013-06-01 HISTORY — DX: Chronic obstructive pulmonary disease with (acute) exacerbation: J44.1

## 2013-06-01 MED ORDER — PREDNISONE 10 MG PO TABS: 10.0000 mg | ORAL_TABLET | Freq: Every day | ORAL | Status: DC

## 2013-06-01 MED ORDER — ALBUTEROL SULFATE (2.5 MG/3ML) 0.083% IN NEBU: 2.5000 mg | INHALATION_SOLUTION | Freq: Four times a day (QID) | RESPIRATORY_TRACT | Status: DC | PRN

## 2013-06-01 MED ORDER — ALBUTEROL SULFATE HFA 108 (90 BASE) MCG/ACT IN AERS: 2.0000 | INHALATION_SPRAY | RESPIRATORY_TRACT | Status: DC | PRN

## 2013-06-01 NOTE — Progress Notes (Signed)
Patient is here to see dr Was told she is using to much of her inhalers

## 2013-06-01 NOTE — Progress Notes (Signed)
Patient ID: Margaret Henry, female   DOB: 1951/04/24, 62 y.o.   MRN: [id number]  CC: Followup visit  HPI: Patient is a 62 years old woman here for followup visit. She has been seen here for COPD. She quit smoking 2 weeks ago. 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 She has a followup appointment in 2 weeks. She is to continue to have shortness of breath, and cough, nonproductive. Denies chest pain, no headache.  Allergies  Allergen Reactions  . Hydrocodone Nausea And Vomiting  . Propoxyphene-Acetaminophen Nausea And Vomiting   Past Medical History  Diagnosis Date  . COPD (chronic obstructive pulmonary disease)   . Bronchitis   . High cholesterol    Current Outpatient Prescriptions on File Prior to Visit  Medication Sig Dispense Refill  . acetaminophen (TYLENOL) 500 MG tablet Take 1,500 mg by mouth every 4 (four) hours as needed. For headache      . Fluticasone-Salmeterol (ADVAIR) 100-50 MCG/DOSE AEPB Inhale 1 puff into the lungs 2 (two) times daily.  1 each  3  . loratadine (CLARITIN) 10 MG tablet Take 1 tablet (10 mg total) by mouth daily.  30 tablet  3  . mometasone (NASONEX) 50 MCG/ACT nasal spray Place 2 sprays into the nose daily.  17 g  3  . Multiple Vitamin (MULITIVITAMIN WITH MINERALS) TABS Take 1 tablet by mouth daily.      Marland Kitchen OVER THE COUNTER MEDICATION Take 1 capsule by mouth daily. Megared omega 3 and krill oil      . tiotropium (SPIRIVA) 18 MCG inhalation capsule Place 1 capsule (18 mcg total) into inhaler and inhale daily.  30 capsule  3  . [DISCONTINUED] simvastatin (ZOCOR) 80 MG tablet Take 80 mg by mouth at bedtime.       No current facility-administered medications on file prior to visit.   No family history on file. History   Social History  . Marital Status: Widowed    Spouse Name: N/A    Number of Children: N/A  . Years of Education: N/A   Occupational History  . Not on  file.   Social History Main Topics  . Smoking status: Current Every Day Smoker -- 0.50 packs/day  . Smokeless tobacco: Current User  . Alcohol Use: No  . Drug Use: No  . Sexual Activity:    Other Topics Concern  . Not on file   Social History Narrative  . No narrative on file    Review of Systems: Constitutional: Negative for fever, chills, diaphoresis, activity change, appetite change and fatigue. HENT: Negative for ear pain, nosebleeds, congestion, facial swelling, rhinorrhea, neck pain, neck stiffness and ear discharge.  Eyes: Negative for pain, discharge, redness, itching and visual disturbance. Respiratory: Negative for cough, choking, chest tightness, shortness of breath, wheezing and stridor.  Cardiovascular: Negative for chest pain, palpitations and leg swelling. Gastrointestinal: Negative for abdominal distention. Genitourinary: Negative for dysuria, urgency, frequency, hematuria, flank pain, decreased urine volume, difficulty urinating and dyspareunia.  Musculoskeletal: Negative for back pain, joint swelling, arthralgias and gait problem. Neurological: Negative for dizziness, tremors, seizures, syncope, facial asymmetry, speech difficulty, weakness, light-headedness, numbness and headaches.  Hematological: Negative for adenopathy. Does not bruise/bleed easily. Psychiatric/Behavioral: Negative for hallucinations, behavioral problems, confusion, dysphoric mood, decreased concentration and agitation.    Objective:   Filed Vitals:   06/01/13 1017  BP: 149/76  Pulse: 69  Temp: 97 F (36.1 C)  Resp: 16  Physical Exam: Constitutional: Patient appears well-developed and well-nourished. No distress. HENT: Normocephalic, atraumatic, External right and left ear normal. Oropharynx is clear and moist.  Eyes: Conjunctivae and EOM are normal. PERRLA, no scleral icterus. Neck: Normal ROM. Neck supple. No JVD. No tracheal deviation. No thyromegaly. CVS: RRR, S1/S2 +, no  murmurs, no gallops, no carotid bruit.  Pulmonary: Effort and breath sounds normal, no stridor, rhonchi, wheezes, rales.  Abdominal: Soft. BS +,  no distension, tenderness, rebound or guarding.  Musculoskeletal: Normal range of motion. No edema and no tenderness.  Lymphadenopathy: No lymphadenopathy noted, cervical, inguinal or axillary Neuro: Alert. Normal reflexes, muscle tone coordination. No cranial nerve deficit. Skin: Area of post surgical scar on chest, minimal erythema surrounding the scar, no active bleeding or discharge. Psychiatric: Normal mood and affect. Behavior, judgment, thought content normal.  Lab Results  Component Value Date   WBC 8.3 11/24/2012   HGB 16.8* 11/24/2012   HCT 47.0* 11/24/2012   MCV 92.7 11/24/2012   PLT 231 11/24/2012   Lab Results  Component Value Date   CREATININE 0.58 11/24/2012   BUN 8 11/24/2012   NA 142 11/24/2012   K 4.4 11/24/2012   CL 104 11/24/2012   CO2 26 11/24/2012    Lab Results  Component Value Date   HGBA1C 5.7* 11/24/2012   Lipid Panel     Component Value Date/Time   CHOL 277* 11/24/2012 1020   TRIG 229* 11/24/2012 1020   HDL 49 11/24/2012 1020   CHOLHDL 5.7 11/24/2012 1020   VLDL 46* 11/24/2012 1020   LDLCALC 182* 11/24/2012 1020       Assessment and plan:   Patient Active Problem List   Diagnosis Date Noted  . COPD exacerbation 06/01/2013  . UNSPECIFIED VITAMIN D DEFICIENCY 03/02/2010  . ALLERGIC RHINITIS 03/02/2010  . BACK PAIN, LUMBAR 03/02/2010  . TROCHANTERIC BURSITIS, BILATERAL 03/02/2010  . THYROID NODULE, RIGHT 11/09/2009  . DEPRESSION 11/09/2009  . COUGH 11/09/2009  . HYPERLIPIDEMIA 02/25/2009  . GERD 02/25/2009  . OSTEOPOROSIS 01/30/2009  . COMPRESSION FRACTURE, SPINE 01/21/2009  . TOBACCO ABUSE 09/07/2008  . BRONCHITIS, CHRONIC 09/07/2008  . LUQ PAIN 09/07/2008  . GOITER, UNSPECIFIED 10/08/1997   Prednisone tablet 10 mg by mouth daily for 5-7 Nebulizer machine prescribed, patient is to nebulizer albuterol  every 6 when necessary shortness of breath Was encouraged to continue smoking cessation  Margaret Henry was given clear instructions to go to ER or return to the clinic if symptoms don't improve, worsen or new problems develop.  Margaret Henry verbalized understanding.  Margaret Henry was told to call to get lab results if hasn't heard anything in the next week.        Jeanann Lewandowsky, MD [facility] [place], [place] [phone]   06/01/2013, 10:46 AM

## 2013-06-12 ENCOUNTER — Other Ambulatory Visit: Payer: Self-pay | Admitting: Internal Medicine

## 2013-06-12 DIAGNOSIS — J441 Chronic obstructive pulmonary disease with (acute) exacerbation: Secondary | ICD-10-CM

## 2013-07-07 ENCOUNTER — Encounter: Payer: Self-pay | Admitting: Family Medicine

## 2013-07-07 ENCOUNTER — Ambulatory Visit: Payer: BC Managed Care – PPO | Attending: Family Medicine | Admitting: Family Medicine

## 2013-07-07 VITALS — BP 155/77 | HR 86 | Temp 97.9°F | Resp 18 | Ht 64.0 in | Wt 120.0 lb

## 2013-07-07 DIAGNOSIS — Z23 Encounter for immunization: Secondary | ICD-10-CM

## 2013-07-07 DIAGNOSIS — E049 Nontoxic goiter, unspecified: Secondary | ICD-10-CM

## 2013-07-07 DIAGNOSIS — E041 Nontoxic single thyroid nodule: Secondary | ICD-10-CM

## 2013-07-07 DIAGNOSIS — E785 Hyperlipidemia, unspecified: Secondary | ICD-10-CM

## 2013-07-07 DIAGNOSIS — K219 Gastro-esophageal reflux disease without esophagitis: Secondary | ICD-10-CM

## 2013-07-07 DIAGNOSIS — E559 Vitamin D deficiency, unspecified: Secondary | ICD-10-CM

## 2013-07-07 DIAGNOSIS — J309 Allergic rhinitis, unspecified: Secondary | ICD-10-CM | POA: Insufficient documentation

## 2013-07-07 DIAGNOSIS — M810 Age-related osteoporosis without current pathological fracture: Secondary | ICD-10-CM

## 2013-07-07 LAB — COMPLETE METABOLIC PANEL WITH GFR
ALT: 12 U/L (ref 0–35)
AST: 16 U/L (ref 0–37)
Alkaline Phosphatase: 65 U/L (ref 39–117)
Calcium: 10 mg/dL (ref 8.4–10.5)
Chloride: 104 mEq/L (ref 96–112)
Creat: 0.69 mg/dL (ref 0.50–1.10)
GFR, Est African American: >89 mL/min
GFR, Est Non African American: >89 mL/min
Sodium: 140 mEq/L (ref 135–145)
Total Bilirubin: 0.5 mg/dL (ref 0.3–1.2)
Total Protein: 7.8 g/dL (ref 6.0–8.3)

## 2013-07-07 LAB — CBC
MCH: 32.1 pg (ref 26.0–34.0)
MCHC: 35.1 g/dL (ref 30.0–36.0)
MCV: 91.4 fL (ref 78.0–100.0)
Platelets: 271 10*3/uL (ref 150–400)
RDW: 13.6 % (ref 11.5–15.5)

## 2013-07-07 LAB — LIPID PANEL
HDL: 48 mg/dL (ref >39)
LDL Cholesterol: 144 mg/dL — ABNORMAL HIGH (ref 0–99)
Total CHOL/HDL Ratio: 5.4 Ratio
Triglycerides: 341 mg/dL — ABNORMAL HIGH (ref <150)

## 2013-07-07 NOTE — Progress Notes (Signed)
Patient ID: Margaret Henry, female   DOB: 1951/05/13, 62 y.o.   MRN: [id number]  CC:  Follow up   HPI: Patient reports that she is presenting to have her labs done.  She reports that she stopped smoking approximately a month ago.  She reports that she is using an electronic vapor.  The patient reports that she's using her nebulizer 3-4 times per week.  She reports that her breathing has started to improve.  She's using Spiriva regularly.  She is having some dry mouth from that.  She reports that she would like to have a flu vaccine today.  She otherwise reports that she's been doing well.  She denies chest pain or shortness of breath.  She has occasional episodes of vertigo.  She also has nasal congestion.  She is using Nasacort and that seems to help her symptoms.  She also had a skin cancer removed from her anterior chest wall and is scheduled to followup with dermatology later today.    Allergies  Allergen Reactions  . Hydrocodone Nausea And Vomiting  . Propoxyphene-Acetaminophen Nausea And Vomiting   Past Medical History  Diagnosis Date  . COPD (chronic obstructive pulmonary disease)   . Bronchitis   . High cholesterol    Current Outpatient Prescriptions on File Prior to Visit  Medication Sig Dispense Refill  . albuterol (PROVENTIL HFA;VENTOLIN HFA) 108 (90 BASE) MCG/ACT inhaler Inhale 2 puffs into the lungs every 4 (four) hours as needed for wheezing or shortness of breath. For shortness of breath  1 Inhaler  4  . albuterol (PROVENTIL) (2.5 MG/3ML) 0.083% nebulizer solution Take 3 mLs (2.5 mg total) by nebulization every 6 (six) hours as needed for wheezing.  150 mL  1  . Fluticasone-Salmeterol (ADVAIR) 100-50 MCG/DOSE AEPB Inhale 1 puff into the lungs 2 (two) times daily.  1 each  3  . loratadine (CLARITIN) 10 MG tablet Take 1 tablet (10 mg total) by mouth daily.  30 tablet  3  . mometasone (NASONEX) 50 MCG/ACT nasal spray Place 2 sprays into the nose daily.  17 g  3  . Multiple Vitamin  (MULITIVITAMIN WITH MINERALS) TABS Take 1 tablet by mouth daily.      Marland Kitchen OVER THE COUNTER MEDICATION Take 1 capsule by mouth daily. Megared omega 3 and krill oil      . tiotropium (SPIRIVA) 18 MCG inhalation capsule Place 1 capsule (18 mcg total) into inhaler and inhale daily.  30 capsule  3  . acetaminophen (TYLENOL) 500 MG tablet Take 1,500 mg by mouth every 4 (four) hours as needed. For headache      . predniSONE (DELTASONE) 10 MG tablet Take 1 tablet (10 mg total) by mouth daily.  10 tablet  0  . [DISCONTINUED] simvastatin (ZOCOR) 80 MG tablet Take 80 mg by mouth at bedtime.       No current facility-administered medications on file prior to visit.   History reviewed. No pertinent family history. History   Social History  . Marital Status: Widowed    Spouse Name: N/A    Number of Children: N/A  . Years of Education: N/A   Occupational History  . Not on file.   Social History Main Topics  . Smoking status: Current Every Day Smoker -- 0.50 packs/day  . Smokeless tobacco: Current User  . Alcohol Use: No  . Drug Use: No  . Sexual Activity:    Other Topics Concern  . Not on file   Social History Narrative  .  No narrative on file    Review of Systems  Constitutional: Negative for fever, chills, diaphoresis, activity change, appetite change and fatigue.  HENT: chronic dry mouth.  Negative for ear pain, nosebleeds, congestion, facial swelling, rhinorrhea, neck pain, neck stiffness and ear discharge.   Eyes: Negative for pain, discharge, redness, itching and visual disturbance.  Respiratory: Negative for cough, choking, chest tightness, shortness of breath, wheezing and stridor.   Cardiovascular: Negative for chest pain, palpitations and leg swelling.  Gastrointestinal: Negative for abdominal distention.  Genitourinary: Negative for dysuria, urgency, frequency, hematuria, flank pain, decreased urine volume, difficulty urinating and dyspareunia.  Musculoskeletal: Negative for back  pain, joint swelling, arthralgias and gait problem.  Neurological: Negative for dizziness, tremors, seizures, syncope, facial asymmetry, speech difficulty, weakness, light-headedness, numbness and headaches.  Hematological: Negative for adenopathy. Does not bruise/bleed easily.  Psychiatric/Behavioral: Negative for hallucinations, behavioral problems, confusion, dysphoric mood, decreased concentration and agitation.    Objective:   Filed Vitals:   07/07/13 1011  BP: 155/77  Pulse: 86  Temp: 97.9 F (36.6 C)  Resp: 18    Physical Exam  Constitutional: Appears well-developed and well-nourished. No distress.  HENT: Normocephalic. External right and left ear normal. Fluid behind right TM.  Oropharynx is clear and moist.  Eyes: Conjunctivae and EOM are normal. PERRLA, no scleral icterus.  Neck: Normal ROM. Neck supple. No JVD. No tracheal deviation. No thyromegaly.  CVS: RRR, S1/S2 +, no murmurs, no gallops, no carotid bruit.  Pulmonary: Effort and breath sounds normal, no stridor, rhonchi, wheezes, rales.  Abdominal: Soft. BS +,  no distension, tenderness, rebound or guarding.  Musculoskeletal: Normal range of motion. No edema and no tenderness.  Lymphadenopathy: No lymphadenopathy noted, cervical, inguinal. Neuro: Alert. Normal reflexes, muscle tone coordination. No cranial nerve deficit. Skin: Healed biopsy wound left anterior chest wall.  Skin is warm and dry. No rash noted. Not diaphoretic. No erythema. No pallor.  Psychiatric: Normal mood and affect. Behavior, judgment, thought content normal.   Lab Results  Component Value Date   WBC 8.3 11/24/2012   HGB 16.8* 11/24/2012   HCT 47.0* 11/24/2012   MCV 92.7 11/24/2012   PLT 231 11/24/2012   Lab Results  Component Value Date   CREATININE 0.58 11/24/2012   BUN 8 11/24/2012   NA 142 11/24/2012   K 4.4 11/24/2012   CL 104 11/24/2012   CO2 26 11/24/2012   Lab Results  Component Value Date   HGBA1C 5.7* 11/24/2012   Lipid Panel      Component Value Date/Time   CHOL 277* 11/24/2012 1020   TRIG 229* 11/24/2012 1020   HDL 49 11/24/2012 1020   CHOLHDL 5.7 11/24/2012 1020   VLDL 46* 11/24/2012 1020   LDLCALC 182* 11/24/2012 1020     Assessment and plan:   Patient Active Problem List   Diagnosis Date Noted  . COPD exacerbation 06/01/2013  . UNSPECIFIED VITAMIN D DEFICIENCY 03/02/2010  . ALLERGIC RHINITIS 03/02/2010  . BACK PAIN, LUMBAR 03/02/2010  . TROCHANTERIC BURSITIS, BILATERAL 03/02/2010  . THYROID NODULE, RIGHT 11/09/2009  . DEPRESSION 11/09/2009  . COUGH 11/09/2009  . HYPERLIPIDEMIA 02/25/2009  . GERD 02/25/2009  . OSTEOPOROSIS 01/30/2009  . COMPRESSION FRACTURE, SPINE 01/21/2009  . TOBACCO ABUSE 09/07/2008  . BRONCHITIS, CHRONIC 09/07/2008  . LUQ PAIN 09/07/2008  . GOITER, UNSPECIFIED 10/08/1997   ALLERGIC RHINITIS - Plan: CBC, COMPLETE METABOLIC PANEL WITH GFR, Lipid panel, POCT glycosylated hemoglobin (Hb A1C), TSH, Vitamin D, 25-hydroxy  GERD - Plan: CBC, COMPLETE  METABOLIC PANEL WITH GFR, Lipid panel, POCT glycosylated hemoglobin (Hb A1C), TSH, Vitamin D, 25-hydroxy  OSTEOPOROSIS - Plan: CBC, COMPLETE METABOLIC PANEL WITH GFR, Lipid panel, POCT glycosylated hemoglobin (Hb A1C), TSH, Vitamin D, 25-hydroxy  HYPERLIPIDEMIA - Plan: CBC, COMPLETE METABOLIC PANEL WITH GFR, Lipid panel, POCT glycosylated hemoglobin (Hb A1C), TSH, Vitamin D, 25-hydroxy  Unspecified vitamin D deficiency - Plan: CBC, COMPLETE METABOLIC PANEL WITH GFR, Lipid panel, POCT glycosylated hemoglobin (Hb A1C), TSH, Vitamin D, 25-hydroxy  Goiter, unspecified - Plan: CBC, COMPLETE METABOLIC PANEL WITH GFR, Lipid panel, POCT glycosylated hemoglobin (Hb A1C), TSH, Vitamin D, 25-hydroxy  THYROID NODULE, RIGHT - Plan: CBC, COMPLETE METABOLIC PANEL WITH GFR, Lipid panel, POCT glycosylated hemoglobin (Hb A1C), TSH, Vitamin D, 25-hydroxy  Concerned about her elevated blood pressure over the past several visits.  I like for her to return to  clinic in 2 weeks for blood pressure check.  If her blood pressure continues to be elevated will start antihypertensive medications.  Follow lab results  Flu vaccine provided today  RTC in 3 months  The patient was given clear instructions to go to ER or return to medical center if symptoms don't improve, worsen or new problems develop.  The patient verbalized understanding.  The patient was told to call to get any lab results if not heard anything in the next week.    Rodney Langton, MD, CDE, FAAFP [facility] [place], [place]

## 2013-07-07 NOTE — Progress Notes (Signed)
PT HERE TO F/U COPD WITH MEDICATION MANAGEMENT FASTING BLOOD WORK

## 2013-07-07 NOTE — Patient Instructions (Signed)
Smoking Cessation, Tips for Success YOU CAN QUIT SMOKING If you are ready to quit smoking, congratulations! You have chosen to help yourself be healthier. Cigarettes bring nicotine, tar, carbon monoxide, and other irritants into your body. Your lungs, heart, and blood vessels will be able to work better without these poisons. There are many different ways to quit smoking. Nicotine gum, nicotine patches, a nicotine inhaler, or nicotine nasal spray can help with physical craving. Hypnosis, support groups, and medicines help break the habit of smoking. Here are some tips to help you quit for good.  Throw away all cigarettes.  Clean and remove all ashtrays from your home, work, and car.  On a card, write down your reasons for quitting. Carry the card with you and read it when you get the urge to smoke.  Cleanse your body of nicotine. Drink enough water and fluids to keep your urine clear or pale yellow. Do this after quitting to flush the nicotine from your body.  Learn to predict your moods. Do not let a bad situation be your excuse to have a cigarette. Some situations in your life might tempt you into wanting a cigarette.  Never have "just one" cigarette. It leads to wanting another and another. Remind yourself of your decision to quit.  Change habits associated with smoking. If you smoked while driving or when feeling stressed, try other activities to replace smoking. Stand up when drinking your coffee. Brush your teeth after eating. Sit in a different chair when you read the paper. Avoid alcohol while trying to quit, and try to drink fewer caffeinated beverages. Alcohol and caffeine may urge you to smoke.  Avoid foods and drinks that can trigger a desire to smoke, such as sugary or spicy foods and alcohol.  Ask people who smoke not to smoke around you.  Have something planned to do right after eating or having a cup of coffee. Take a walk or exercise to perk you up. This will help to keep you  from overeating.  Try a relaxation exercise to calm you down and decrease your stress. Remember, you may be tense and nervous for the first 2 weeks after you quit, but this will pass.  Find new activities to keep your hands busy. Play with a pen, coin, or rubber band. Doodle or draw things on paper.  Brush your teeth right after eating. This will help cut down on the craving for the taste of tobacco after meals. You can try mouthwash, too.  Use oral substitutes, such as lemon drops, carrots, a cinnamon stick, or chewing gum, in place of cigarettes. Keep them handy so they are available when you have the urge to smoke.  When you have the urge to smoke, try deep breathing.  Designate your home as a nonsmoking area.  If you are a heavy smoker, ask your caregiver about a prescription for nicotine chewing gum. It can ease your withdrawal from nicotine.  Reward yourself. Set aside the cigarette money you save and buy yourself something nice.  Look for support from others. Join a support group or smoking cessation program. Ask someone at home or at work to help you with your plan to quit smoking.  Always ask yourself, "Do I need this cigarette or is this just a reflex?" Tell yourself, "Today, I choose not to smoke," or "I do not want to smoke." You are reminding yourself of your decision to quit, even if you do smoke a cigarette. HOW WILL I FEEL WHEN   I QUIT SMOKING?  The benefits of not smoking start within days of quitting.  You may have symptoms of withdrawal because your body is used to nicotine (the addictive substance in cigarettes). You may crave cigarettes, be irritable, feel very hungry, cough often, get headaches, or have difficulty concentrating.  The withdrawal symptoms are only temporary. They are strongest when you first quit but will go away within 10 to 14 days.  When withdrawal symptoms occur, stay in control. Think about your reasons for quitting. Remind yourself that these are  signs that your body is healing and getting used to being without cigarettes.  Remember that withdrawal symptoms are easier to treat than the major diseases that smoking can cause.  Even after the withdrawal is over, expect periodic urges to smoke. However, these cravings are generally short-lived and will go away whether you smoke or not. Do not smoke!  If you relapse and smoke again, do not lose hope. Most smokers quit 3 times before they are successful.  If you relapse, do not give up! Plan ahead and think about what you will do the next time you get the urge to smoke. LIFE AS A NONSMOKER: MAKE IT FOR A MONTH, MAKE IT FOR LIFE Day 1: Hang this page where you will see it every day. Day 2: Get rid of all ashtrays, matches, and lighters. Day 3: Drink water. Breathe deeply between sips. Day 4: Avoid places with smoke-filled air, such as bars, clubs, or the smoking section of restaurants. Day 5: Keep track of how much money you save by not smoking. Day 6: Avoid boredom. Keep a good book with you or go to the movies. Day 7: Reward yourself! One week without smoking! Day 8: Make a dental appointment to get your teeth cleaned. Day 9: Decide how you will turn down a cigarette before it is offered to you. Day 10: Review your reasons for quitting. Day 11: Distract yourself. Stay active to keep your mind off smoking and to relieve tension. Take a walk, exercise, read a book, do a crossword puzzle, or try a new hobby. Day 12: Exercise. Get off the bus before your stop or use stairs instead of escalators. Day 13: Call on friends for support and encouragement. Day 14: Reward yourself! Two weeks without smoking! Day 15: Practice deep breathing exercises. Day 16: Bet a friend that you can stay a nonsmoker. Day 17: Ask to sit in nonsmoking sections of restaurants. Day 18: Hang up "No Smoking" signs. Day 19: Think of yourself as a nonsmoker. Day 20: Each morning, tell yourself you will not smoke. Day  21: Reward yourself! Three weeks without smoking! Day 22: Think of smoking in negative ways. Remember how it stains your teeth, gives you bad breath, and leaves you short of breath. Day 23: Eat a nutritious breakfast. Day 24:Do not relive your days as a smoker. Day 25: Hold a pencil in your hand when talking on the telephone. Day 26: Tell all your friends you do not smoke. Day 27: Think about how much better food tastes. Day 28: Remember, one cigarette is one too many. Day 29: Take up a hobby that will keep your hands busy. Day 30: Congratulations! One month without smoking! Give yourself a big reward. Your caregiver can direct you to community resources or hospitals for support, which may include:  Group support.  Education.  Hypnosis.  Subliminal therapy. Document Released: 06/22/2004 Document Revised: 12/17/2011 Document Reviewed: 07/11/2009 [facility] Patient Information 2014 [place], [place]. Allergic  Rhinitis Allergic rhinitis is when the mucous membranes in the nose respond to allergens. Allergens are particles in the air that cause your body to have an allergic reaction. This causes you to release allergic antibodies. Through a chain of events, these eventually cause you to release histamine into the blood stream (hence the use of antihistamines). Although meant to be protective to the body, it is this release that causes your discomfort, such as frequent sneezing, congestion and an itchy runny nose.  CAUSES  The pollen allergens may come from grasses, trees, and weeds. This is seasonal allergic rhinitis, or "hay fever." Other allergens cause year-round allergic rhinitis (perennial allergic rhinitis) such as house dust mite allergen, pet dander and mold spores.  SYMPTOMS   Nasal stuffiness (congestion).  Runny, itchy nose with sneezing and tearing of the eyes.  There is often an itching of the mouth, eyes and ears. It cannot be cured, but it can be controlled with  medications. DIAGNOSIS  If you are unable to determine the offending allergen, skin or blood testing may find it. TREATMENT   Avoid the allergen.  Medications and allergy shots (immunotherapy) can help.  Hay fever may often be treated with antihistamines in pill or nasal spray forms. Antihistamines block the effects of histamine. There are over-the-counter medicines that may help with nasal congestion and swelling around the eyes. Check with your caregiver before taking or giving this medicine. If the treatment above does not work, there are many new medications your caregiver can prescribe. Stronger medications may be used if initial measures are ineffective. Desensitizing injections can be used if medications and avoidance fails. Desensitization is when a patient is given ongoing shots until the body becomes less sensitive to the allergen. Make sure you follow up with your caregiver if problems continue. SEEK MEDICAL CARE IF:   You develop fever (more than 100.5 F (38.1 C).  You develop a cough that does not stop easily (persistent).  You have shortness of breath.  You start wheezing.  Symptoms interfere with normal daily activities. Document Released: 06/19/2001 Document Revised: 12/17/2011 Document Reviewed: 12/29/2008 [facility] Patient Information 2014 [place], [place]. Chronic Obstructive Pulmonary Disease Chronic obstructive pulmonary disease (COPD) is a lung disease. The lungs become damaged. This makes it hard to get air in and out of your lungs. The damage to your lungs cannot be changed. There are things you can do to improve the lungs and make you feel better. HOME CARE  Take all medicines as told by your doctor.  Use medicines that you breathe in (inhale) as told by your doctor.  Avoid medicines or cough syrups that dry up your airway (antihistamines) and do not allow you to get rid of thick spit.  If you smoke, stop.  Avoid being around smoke, chemicals, and fumes  that bother your breathing.  Avoid people that have a catchy (contagious) sickness.  Avoid going outside when it is very hot, cold, or humid.  Use humidifiers in your home and near your bedside if it helps your breathing.  Drink enough fluids to keep your pee (urine) clear or pale yellow.  Eat healthy foods. Eat smaller meals more often and rest before meals.  Ask your doctor if it is okay to take vitamins or pills with minerals (supplements).  Exercise and stay active.  Rest with activity.  Get into a comfortable position when you have trouble breathing.  Learn and use tips on how to relax.  Learn and use tips on how to  control your breathing as told by your doctor. Try:  Breathing in through your nose for 1 second. Then, breath out (exhale) through your puckered (like a whistle) lips for 2 seconds.  Putting one hand on your belly (abdomen). Breathe in slowly through your nose. Your hand on your belly should move out. Breathe out slowly through your puckered lips. Your hand on your belly should move in as you breathe out.  Learn and use controlled coughing to clear thick spit from your lungs. 1. Lean your head slightly forward. 2. Breathe in deeply. 3. Try to hold your breath for 3 seconds. 4. Keep your mouth slightly open while coughing 2 times. 5. Spit any thick spit out into a tissue. 6. Rest and do the steps again 1 or 2 times as needed.  Get all shots (vaccines) a told by your doctor.  Learn how to manage stress.  Schedule and go to all follow-up doctor visits.  Go to therapy that can help you improve your lungs (pulmonary rehabilitation) as told by your doctor.  Use oxygen at home as told by your doctor. GET HELP RIGHT AWAY IF:   You can feel your heart beating really fast.  You have shortness of breath while resting.  You have shortness of breath that stops you from being able to talk.  You have shortness of breath that stops you from doing normal  activities.  You have chest pain lasting longer than 5 minutes.  You start to shake uncontrollably (seizure).  Your family or friends notice that you are flustered or confused.  You cough up more thick spit than usual.  There is a change in the color or thickness of the spit.  Breathing is more difficult than usual.  Your breathing is faster than usual.  Your skin color is more blueish than usual.  You are running out of the medicine you take for breathing.  You are anxious, uneasy, fearful, or restless.  You have a fever. MAKE SURE YOU:   Understand these instructions.  Will watch your condition.  Will get help right away if you are not doing well or get worse. Document Released: 03/12/2008 Document Revised: 09/10/2012 Document Reviewed: 11/24/2010 [facility] Patient Information 2014 [place], [place].

## 2013-07-08 NOTE — Progress Notes (Signed)
Quick Note:  Please inform patient that labs came back okay except that the hemoglobin level was elevated. Also cholesterol levels were very elevated but slightly improved from 7 months ago. I strongly advised the patient to please take her cholesterol medications. The patient needs to have these labs rechecked again in 3 months.  Rodney Langton, MD, CDE, FAAFP [facility] [place], [place]   ______

## 2013-07-13 ENCOUNTER — Emergency Department (INDEPENDENT_AMBULATORY_CARE_PROVIDER_SITE_OTHER): Payer: BC Managed Care – PPO

## 2013-07-13 ENCOUNTER — Encounter (HOSPITAL_COMMUNITY): Payer: Self-pay | Admitting: Emergency Medicine

## 2013-07-13 ENCOUNTER — Emergency Department (INDEPENDENT_AMBULATORY_CARE_PROVIDER_SITE_OTHER)
Admission: EM | Admit: 2013-07-13 | Discharge: 2013-07-13 | Disposition: A | Discharge status: Home / Self Care | Payer: BC Managed Care – PPO | Source: Home / Self Care

## 2013-07-13 ENCOUNTER — Telehealth: Payer: Self-pay | Admitting: Emergency Medicine

## 2013-07-13 DIAGNOSIS — S139XXA Sprain of joints and ligaments of unspecified parts of neck, initial encounter: Secondary | ICD-10-CM

## 2013-07-13 MED ORDER — TRAMADOL HCL 50 MG PO TABS: 50.0000 mg | ORAL_TABLET | Freq: Four times a day (QID) | ORAL | Status: DC | PRN

## 2013-07-13 MED ORDER — DICLOFENAC POTASSIUM 50 MG PO TABS: 50.0000 mg | ORAL_TABLET | Freq: Three times a day (TID) | ORAL | Status: DC

## 2013-07-13 NOTE — Telephone Encounter (Signed)
Message copied by Darlis Loan on Mon Jul 13, 2013  9:48 AM ------      Message from: Cleora Fleet      Created: Wed Jul 08, 2013  9:05 AM       Please inform patient that labs came back okay except that the hemoglobin level was elevated.  Also cholesterol levels were very elevated but slightly improved from 7 months ago.  I strongly advised the patient to please take her cholesterol medications.  The patient needs to have these labs rechecked again in 3 months.            Rodney Langton, MD, CDE, FAAFP      [facility]      [facility]      [place], [place]        ------

## 2013-07-13 NOTE — ED Provider Notes (Signed)
CSN: [id number]     Arrival date & time 07/13/13  1117 History   First MD Initiated Contact with Patient 07/13/13 1208     Chief Complaint  Patient presents with  . Neck Pain   (Consider location/radiation/quality/duration/timing/severity/associated sxs/prior Treatment) HPI Comments: 62 year old female presents with neck pain. She states 4 days ago she awoke with a "crick in the neck". She has pain with rotation of the neck to the right. Pain is located in the midline of the cervical spine from approximately C3 to T1. She denies paresthesias or focal weakness. She has tried heat, ice, topical agents such as BenGay, acetaminophen and NSAIDs without any relief. Denies any known history of trauma however gives a history of degenerative disc disease, degenerative joint disease and osteoporosis with vertebral fractures.   Past Medical History  Diagnosis Date  . COPD (chronic obstructive pulmonary disease)   . Bronchitis   . High cholesterol    Past Surgical History  Procedure Laterality Date  . Abdominal hysterectomy     History reviewed. No pertinent family history. History  Substance Use Topics  . Smoking status: Current Every Day Smoker -- 0.50 packs/day  . Smokeless tobacco: Current User  . Alcohol Use: No   OB History   Grav Para Term Preterm Abortions TAB SAB Ect Mult Living                 Review of Systems  Constitutional: Positive for activity change. Negative for fever and fatigue.  HENT: Positive for neck pain. Negative for facial swelling and neck stiffness.   Eyes: Negative for visual disturbance.  Respiratory: Negative.   Cardiovascular: Negative for chest pain and palpitations.  Gastrointestinal: Negative.   Genitourinary: Negative.   Musculoskeletal: Negative for back pain.  Skin: Negative.   Neurological: Negative.     Allergies  Hydrocodone and Propoxyphene-acetaminophen  Home Medications   Current Outpatient Rx  Name  Route  Sig  Dispense  Refill  .  acetaminophen (TYLENOL) 500 MG tablet   Oral   Take 1,500 mg by mouth every 4 (four) hours as needed. For headache         . albuterol (PROVENTIL HFA;VENTOLIN HFA) 108 (90 BASE) MCG/ACT inhaler   Inhalation   Inhale 2 puffs into the lungs every 4 (four) hours as needed for wheezing or shortness of breath. For shortness of breath   1 Inhaler   4   . albuterol (PROVENTIL) (2.5 MG/3ML) 0.083% nebulizer solution   Nebulization   Take 3 mLs (2.5 mg total) by nebulization every 6 (six) hours as needed for wheezing.   150 mL   1   . diclofenac (CATAFLAM) 50 MG tablet   Oral   Take 1 tablet (50 mg total) by mouth 3 (three) times daily. As needed for neck pain. Take with food.   21 tablet   0   . Fluticasone-Salmeterol (ADVAIR) 100-50 MCG/DOSE AEPB   Inhalation   Inhale 1 puff into the lungs 2 (two) times daily.   1 each   3   . loratadine (CLARITIN) 10 MG tablet   Oral   Take 1 tablet (10 mg total) by mouth daily.   30 tablet   3   . mometasone (NASONEX) 50 MCG/ACT nasal spray   Nasal   Place 2 sprays into the nose daily.   17 g   3   . Multiple Vitamin (MULITIVITAMIN WITH MINERALS) TABS   Oral   Take 1 tablet by mouth daily.         Marland Kitchen  OVER THE COUNTER MEDICATION   Oral   Take 1 capsule by mouth daily. Megared omega 3 and krill oil         . predniSONE (DELTASONE) 10 MG tablet   Oral   Take 1 tablet (10 mg total) by mouth daily.   10 tablet   0   . tiotropium (SPIRIVA) 18 MCG inhalation capsule   Inhalation   Place 1 capsule (18 mcg total) into inhaler and inhale daily.   30 capsule   3   . traMADol (ULTRAM) 50 MG tablet   Oral   Take 1 tablet (50 mg total) by mouth every 6 (six) hours as needed for pain.   15 tablet   0    BP 127/59  Pulse 96  Temp(Src) 98.1 F (36.7 C) (Oral)  Resp 16  SpO2 97% Physical Exam  Nursing note and vitals reviewed. Constitutional: She is oriented to person, place, and time. She appears well-developed and  well-nourished. No distress.  HENT:  Head: Normocephalic and atraumatic.  There is tenderness directly over the C-spine from approximately C3-T1. No paraspinal tenderness, no muscular tenderness, no palpable deformity or step-off deformity. Rotation of the head limited to approximately 40 to the right with pain elicited in that position. Flexion to the left 45 without pain. Forward flexion to 45. She is able to abduct both arms over her head without pain.  Eyes: EOM are normal. Pupils are equal, round, and reactive to light.  Neck: Normal range of motion. Neck supple.  Pulmonary/Chest: Effort normal. No respiratory distress.  Lymphadenopathy:    She has no cervical adenopathy.  Neurological: She is alert and oriented to person, place, and time. No cranial nerve deficit.  Skin: Skin is warm and dry.  Psychiatric: She has a normal mood and affect.    ED Course  Procedures (including critical care time) Labs Review Labs Reviewed - No data to display Imaging Review Dg Cervical Spine Complete  07/13/2013   CLINICAL DATA:  Neck pain.  EXAM: CERVICAL SPINE  4+ VIEWS  COMPARISON:  None  FINDINGS: The cervical spine is visualized from the skullbase through the cervicothoracic junction. The vertebral body heights are maintained. Slight degenerative anterolisthesis is present at C4-5 patent bilaterally. Mild scarring is noted lung apices bilaterally. Atherosclerotic calcifications are present at the aortic arch. Vascular calcifications are evident at the carotid bifurcations bilaterally.  IMPRESSION: 1. Mild degenerative changes in the cervical spine. 2. No acute or focal abnormality to explain the patient's symptoms. 3. Degenerative changes in the cervical spine. 4. Atherosclerosis.   Electronically Signed   By: Gennette Pac M.D.   On: 07/13/2013 12:55    MDM   1. Cervical sprain, initial encounter       Ultram 50 mg every 4-6 hours when necessary pain Cataflam 50 mg 3 times a day with food  when necessary pain Soft cervical collar Continue to apply heat Followup with your doctor as needed.  Hayden Rasmussen, NP 07/13/13 1321

## 2013-07-13 NOTE — Telephone Encounter (Signed)
Left message with lab results and need to schedule lab appt.

## 2013-07-13 NOTE — ED Notes (Signed)
C/o neck pain since Thursday morning waking up like this.  Patient has tried heat, ice, creams, and bathing and showering but no relief.  Denies any injury.

## 2013-07-14 ENCOUNTER — Telehealth: Payer: Self-pay | Admitting: Internal Medicine

## 2013-07-14 NOTE — Telephone Encounter (Signed)
Pt returning call about labs.  Also left message on voicemail that she is still experiencing pain. Please f/u with pt.

## 2013-07-15 NOTE — ED Provider Notes (Signed)
Medical screening examination/treatment/procedure(s) were performed by a resident physician or non-physician practitioner and as the supervising physician I was immediately available for consultation/collaboration.  Clementeen Graham, MD    Rodolph Bong, MD 07/15/13 [phone]

## 2013-07-21 ENCOUNTER — Ambulatory Visit: Payer: BC Managed Care – PPO | Attending: Internal Medicine

## 2013-07-21 NOTE — Progress Notes (Unsigned)
Pt is here today for a blood pressure check

## 2013-07-21 NOTE — Patient Instructions (Signed)
Today pt's blood pressure is great. Continue taking medications the same way.

## 2013-07-21 NOTE — Progress Notes (Deleted)
  Subjective:    Patient ID: Margaret Henry, female    DOB: [birth year], 62 y.o.   MRN: [id number]  HPI    Review of Systems     Objective:   Physical Exam        Assessment & Plan:

## 2013-09-28 ENCOUNTER — Ambulatory Visit: Payer: BC Managed Care – PPO | Attending: Internal Medicine | Admitting: Internal Medicine

## 2013-09-28 ENCOUNTER — Encounter: Payer: Self-pay | Admitting: Internal Medicine

## 2013-09-28 VITALS — BP 128/76 | HR 89 | Temp 98.7°F | Resp 14 | Ht 64.0 in | Wt 116.6 lb

## 2013-09-28 DIAGNOSIS — J441 Chronic obstructive pulmonary disease with (acute) exacerbation: Secondary | ICD-10-CM | POA: Insufficient documentation

## 2013-09-28 DIAGNOSIS — F17200 Nicotine dependence, unspecified, uncomplicated: Secondary | ICD-10-CM

## 2013-09-28 DIAGNOSIS — R059 Cough, unspecified: Secondary | ICD-10-CM | POA: Insufficient documentation

## 2013-09-28 DIAGNOSIS — R05 Cough: Secondary | ICD-10-CM | POA: Insufficient documentation

## 2013-09-28 MED ORDER — ALBUTEROL SULFATE (2.5 MG/3ML) 0.083% IN NEBU: 2.5000 mg | INHALATION_SOLUTION | Freq: Four times a day (QID) | RESPIRATORY_TRACT | Status: DC | PRN

## 2013-09-28 MED ORDER — ALBUTEROL SULFATE HFA 108 (90 BASE) MCG/ACT IN AERS: 2.0000 | INHALATION_SPRAY | RESPIRATORY_TRACT | Status: DC | PRN

## 2013-09-28 NOTE — Patient Instructions (Signed)

## 2013-09-28 NOTE — Progress Notes (Signed)
Pt is here for f/u for COPD. Small rash started on chest area, a lot of itching, x1 day.

## 2013-09-28 NOTE — Progress Notes (Signed)
Patient ID: TASMIN EXANTUS, female   DOB: 1950/11/10, 61 y.o.   MRN: [id number] Patient Demographics  Margaret Henry, is a 62 y.o. female  [id number]  [id number]  DOB - November 08, 1950  Chief Complaint  Patient presents with  . Follow-up        Subjective:   Margaret Henry is a 62 y.o. female here today for a follow up visit. Patient has COPD, on on inhalers which she claims she uses regularly. She started smoking again and cough started as well as occasional wheezing, she needs a refill for her nebulizer albuterol. She claims she is using her Spiriva and Advair as prescribed. He had a rash on her chest which was itchy but now getting better on over-the-counter cream. Cough is nonproductive, no chest pain, no shortness of breath. She will try to quit smoking again Patient has No headache, No chest pain, No abdominal pain - No Nausea, No new weakness tingling or numbness, No Cough - SOB.  ALLERGIES: Allergies  Allergen Reactions  . Hydrocodone Nausea And Vomiting  . Propoxyphene-Acetaminophen Nausea And Vomiting    PAST MEDICAL HISTORY: Past Medical History  Diagnosis Date  . COPD (chronic obstructive pulmonary disease)   . Bronchitis   . High cholesterol     MEDICATIONS AT HOME: Prior to Admission medications   Medication Sig Start Date End Date Taking? Authorizing Provider  acetaminophen (TYLENOL) 500 MG tablet Take 1,500 mg by mouth every 4 (four) hours as needed. For headache   Yes Historical Provider, MD  albuterol (PROVENTIL HFA;VENTOLIN HFA) 108 (90 BASE) MCG/ACT inhaler Inhale 2 puffs into the lungs every 4 (four) hours as needed for wheezing or shortness of breath. For shortness of breath 09/28/13  Yes Jeanann Lewandowsky, MD  albuterol (PROVENTIL) (2.5 MG/3ML) 0.083% nebulizer solution Take 3 mLs (2.5 mg total) by nebulization every 6 (six) hours as needed for wheezing. 09/28/13  Yes Jeanann Lewandowsky, MD  Fluticasone-Salmeterol (ADVAIR) 100-50 MCG/DOSE AEPB Inhale 1 puff  into the lungs 2 (two) times daily. 03/06/13  Yes Acey Lav, MD  loratadine (CLARITIN) 10 MG tablet Take 1 tablet (10 mg total) by mouth daily. 09/29/12  Yes Shanker Levora Dredge, MD  mometasone (NASONEX) 50 MCG/ACT nasal spray Place 2 sprays into the nose daily. 09/29/12  Yes Shanker Levora Dredge, MD  Multiple Vitamin (MULITIVITAMIN WITH MINERALS) TABS Take 1 tablet by mouth daily.   Yes Historical Provider, MD  tiotropium (SPIRIVA) 18 MCG inhalation capsule Place 1 capsule (18 mcg total) into inhaler and inhale daily. 03/06/13  Yes Acey Lav, MD  diclofenac (CATAFLAM) 50 MG tablet Take 1 tablet (50 mg total) by mouth 3 (three) times daily. As needed for neck pain. Take with food. 07/13/13   Hayden Rasmussen, NP  OVER THE COUNTER MEDICATION Take 1 capsule by mouth daily. Megared omega 3 and krill oil    Historical Provider, MD  predniSONE (DELTASONE) 10 MG tablet Take 1 tablet (10 mg total) by mouth daily. 06/01/13   Jeanann Lewandowsky, MD  traMADol (ULTRAM) 50 MG tablet Take 1 tablet (50 mg total) by mouth every 6 (six) hours as needed for pain. 07/13/13   Hayden Rasmussen, NP     Objective:   Filed Vitals:   09/28/13 1053  BP: 128/76  Pulse: 89  Temp: 98.7 F (37.1 C)  TempSrc: Oral  Resp: 14  Height: 5\' 4"  (1.626 m)  Weight: 116 lb 9.6 oz (52.889 kg)  SpO2: 92%    Exam General appearance : Awake,  alert, not in any distress. Speech Clear. Not toxic looking HEENT: Atraumatic and Normocephalic, pupils equally reactive to light and accomodation Neck: supple, no JVD. No cervical lymphadenopathy.  Chest:Good air entry bilaterally, no added sounds  CVS: S1 S2 regular, no murmurs.  Abdomen: Bowel sounds present, Non tender and not distended with no gaurding, rigidity or rebound. Extremities: B/L Lower Ext shows no edema, both legs are warm to touch Neurology: Awake alert, and oriented X 3, CN II-XII intact, Non focal Skin:No Rash Wounds:N/A   Assessment & Plan   1. COPD exacerbation  -  albuterol (PROVENTIL) (2.5 MG/3ML) 0.083% nebulizer solution; Take 3 mLs (2.5 mg total) by nebulization every 6 (six) hours as needed for wheezing.  Dispense: 150 mL; Refill: 1 - albuterol (PROVENTIL HFA;VENTOLIN HFA) 108 (90 BASE) MCG/ACT inhaler; Inhale 2 puffs into the lungs every 4 (four) hours as needed for wheezing or shortness of breath. For shortness of breath  Dispense: 1 Inhaler; Refill: 4  2. Cough Patient counseled about smoking cessation Encouraged to use her nebulizer and inhaler as prescribed  3. Tobacco use disorder Patient counseled extensively about smoking cessation  Follow up in 3 months or when necessary   The patient was given clear instructions to go to ER or return to medical center if symptoms don't improve, worsen or new problems develop. The patient verbalized understanding. The patient was told to call to get lab results if they haven't heard anything in the next week.    Jeanann Lewandowsky, MD, MHA, FACP, FAAP [facility] [place], [place] [phone]   09/28/2013, 12:05 PM

## 2013-12-04 ENCOUNTER — Encounter (HOSPITAL_COMMUNITY): Payer: Self-pay | Admitting: Emergency Medicine

## 2013-12-04 ENCOUNTER — Emergency Department (HOSPITAL_COMMUNITY)
Admission: EM | Admit: 2013-12-04 | Discharge: 2013-12-04 | Disposition: A | Discharge status: Home / Self Care | Payer: BC Managed Care – PPO | Source: Home / Self Care

## 2013-12-04 DIAGNOSIS — Z720 Tobacco use: Secondary | ICD-10-CM

## 2013-12-04 DIAGNOSIS — J441 Chronic obstructive pulmonary disease with (acute) exacerbation: Secondary | ICD-10-CM

## 2013-12-04 DIAGNOSIS — F17200 Nicotine dependence, unspecified, uncomplicated: Secondary | ICD-10-CM

## 2013-12-04 MED ORDER — ALBUTEROL SULFATE HFA 108 (90 BASE) MCG/ACT IN AERS: 2.0000 | INHALATION_SPRAY | Freq: Four times a day (QID) | RESPIRATORY_TRACT | Status: DC | PRN

## 2013-12-04 MED ORDER — ALBUTEROL SULFATE (2.5 MG/3ML) 0.083% IN NEBU
INHALATION_SOLUTION | RESPIRATORY_TRACT | Status: AC
  Filled 2013-12-04: qty 3

## 2013-12-04 MED ORDER — TRIAMCINOLONE ACETONIDE 40 MG/ML IJ SUSP
INTRAMUSCULAR | Status: AC
  Filled 2013-12-04: qty 1

## 2013-12-04 MED ORDER — TRIAMCINOLONE ACETONIDE 40 MG/ML IJ SUSP
40.0000 mg | Freq: Once | INTRAMUSCULAR | Status: AC
  Administered 2013-12-04: 40 mg via INTRAMUSCULAR

## 2013-12-04 MED ORDER — METHYLPREDNISOLONE 4 MG PO KIT: PACK | ORAL | Status: DC

## 2013-12-04 MED ORDER — IPRATROPIUM BROMIDE 0.02 % IN SOLN
RESPIRATORY_TRACT | Status: AC
  Filled 2013-12-04: qty 2.5

## 2013-12-04 MED ORDER — IPRATROPIUM-ALBUTEROL 0.5-2.5 (3) MG/3ML IN SOLN
3.0000 mL | Freq: Once | RESPIRATORY_TRACT | Status: AC
  Administered 2013-12-04: 3 mL via RESPIRATORY_TRACT

## 2013-12-04 NOTE — Discharge Instructions (Signed)
Chronic Obstructive Pulmonary Disease Exacerbation  Chronic obstructive pulmonary disease (COPD) is a common lung problem. In COPD, the flow of air from the lungs is limited. COPD exacerbations are times that breathing gets worse and you need extra treatment. Without treatment they can be life threatening. If they happen often, your lungs can become more damaged. HOME CARE  Do not smoke.  Avoid tobacco smoke and other things that bother your lungs.  If given, take your antibiotic medicine as told. Finish the medicine even if you start to feel better.  Only take medicines as told by your doctor.  Drink enough fluids to keep your pee (urine) clear or pale yellow (unless your doctor has told you not to).  Use a cool mist machine (vaporizer).  If you use oxygen or a machine that turns liquid medicine into a mist (nebulizer), continue to use them as told.  Keep up with shots (vaccinations) as told by your doctor.  Exercise regularly.  Eat healthy foods.  Keep all doctor visits as told. GET HELP RIGHT AWAY IF:  You are very short of breath and it gets worse.  You have trouble talking.  You have bad chest pain.  You have blood in your spit (sputum).  You have a fever.  You keep throwing up (vomiting).  You feel weak, or you pass out (faint).  You feel confused.  You keep getting worse. MAKE SURE YOU:   Understand these instructions.  Will watch your condition.  Will get help right away if you are not doing well or get worse. Document Released: 09/13/2011 Document Revised: 07/15/2013 Document Reviewed: 05/29/2013 [facility] Patient Information 2014 [place].  Chronic Obstructive Pulmonary Disease Chronic obstructive pulmonary disease (COPD) is a common lung condition in which airflow from the lungs is limited. COPD is a general term that can be used to describe many different lung problems that limit airflow, including both chronic bronchitis and emphysema. If you  have COPD, your lung function will probably never return to normal, but there are measures you can take to improve lung function and make yourself feel better.  CAUSES   Smoking (common).   Exposure to secondhand smoke.   Genetic problems.  Chronic inflammatory lung diseases or recurrent infections. SYMPTOMS   Shortness of breath, especially with physical activity.   Deep, persistent (chronic) cough with a large amount of thick mucus.   Wheezing.   Rapid breaths (tachypnea).   Gray or bluish discoloration (cyanosis) of the skin, especially in fingers, toes, or lips.   Fatigue.   Weight loss.   Frequent infections or episodes when breathing symptoms become much worse (exacerbations).   Chest tightness. DIAGNOSIS  Your healthcare provider will take a medical history and perform a physical examination to make the initial diagnosis. Additional tests for COPD may include:   Lung (pulmonary) function tests.  Chest X-ray.  CT scan.  Blood tests. TREATMENT  Treatment available to help you feel better when you have COPD include:   Inhaler and nebulizer medicines. These help manage the symptoms of COPD and make your breathing more comfortable  Supplemental oxygen. Supplemental oxygen is only helpful if you have a low oxygen level in your blood.   Exercise and physical activity. These are beneficial for nearly all people with COPD. Some people may also benefit from a pulmonary rehabilitation program. HOME CARE INSTRUCTIONS   Take all medicines (inhaled or pills) as directed by your health care provider.  Only take over-the-counter or prescription medicines for pain,  fever, or discomfort as directed by your health care provider.   Avoid over-the-counter medicines or cough syrups that dry up your airway (such as antihistamines) and slow down the elimination of secretions unless instructed otherwise by your healthcare provider.   If you are a smoker, the most  important thing that you can do is stop smoking. Continuing to smoke will cause further lung damage and breathing trouble. Ask your health care provider for help with quitting smoking. He or she can direct you to community resources or hospitals that provide support.  Avoid exposure to irritants such as smoke, chemicals, and fumes that aggravate your breathing.  Use oxygen therapy and pulmonary rehabilitation if directed by your health care provider. If you require home oxygen therapy, ask your healthcare provider whether you should purchase a pulse oximeter to measure your oxygen level at home.   Avoid contact with individuals who have a contagious illness.  Avoid extreme temperature and humidity changes.  Eat healthy foods. Eating smaller, more frequent meals and resting before meals may help you maintain your strength.  Stay active, but balance activity with periods of rest. Exercise and physical activity will help you maintain your ability to do things you want to do.  Preventing infection and hospitalization is very important when you have COPD. Make sure to receive all the vaccines your health care provider recommends, especially the pneumococcal and influenza vaccines. Ask your healthcare provider whether you need a pneumonia vaccine.  Learn and use relaxation techniques to manage stress.  Learn and use controlled breathing techniques as directed by your health care provider. Controlled breathing techniques include:   Pursed lip breathing. Start by breathing in (inhaling) through your nose for 1 second. Then, purse your lips as if you were going to whistle and breathe out (exhale) through the pursed lips for 2 seconds.   Diaphragmatic breathing. Start by putting one hand on your abdomen just above your waist. Inhale slowly through your nose. The hand on your abdomen should move out. Then purse your lips and exhale slowly. You should be able to feel the hand on your abdomen moving in  as you exhale.   Learn and use controlled coughing to clear mucus from your lungs. Controlled coughing is a series of short, progressive coughs. The steps of controlled coughing are:  1. Lean your head slightly forward.  2. Breathe in deeply using diaphragmatic breathing.  3. Try to hold your breath for 3 seconds.  4. Keep your mouth slightly open while coughing twice.  5. Spit any mucus out into a tissue.  6. Rest and repeat the steps once or twice as needed. SEEK MEDICAL CARE IF:   You are coughing up more mucus than usual.   There is a change in the color or thickness of your mucus.   Your breathing is more labored than usual.   Your breathing is faster than usual.  SEEK IMMEDIATE MEDICAL CARE IF:   You have shortness of breath while you are resting.   You have shortness of breath that prevents you from:  Being able to talk.   Performing your usual physical activities.   You have chest pain lasting longer than 5 minutes.   Your skin color is more cyanotic than usual.  You measure low oxygen saturations for longer than 5 minutes with a pulse oximeter. MAKE SURE YOU:   Understand these instructions.  Will watch your condition.  Will get help right away if you are not doing well or  get worse. Document Released: 07/04/2005 Document Revised: 07/15/2013 Document Reviewed: 05/21/2013 [facility] Patient Information 2014 [place], [place].  Cough, Adult  A cough is a reflex. It helps you clear your throat and airways. A cough can help heal your body. A cough can last 2 or 3 weeks (acute) or may last more than 8 weeks (chronic). Some common causes of a cough can include an infection, allergy, or a cold. HOME CARE  Only take medicine as told by your doctor.  If given, take your medicines (antibiotics) as told. Finish them even if you start to feel better.  Use a cold steam vaporizer or humidier in your home. This can help loosen thick spit (secretions).  Sleep  so you are almost sitting up (semi-upright). Use pillows to do this. This helps reduce coughing.  Rest as needed.  Stop smoking if you smoke. GET HELP RIGHT AWAY IF:  You have yellowish-white fluid (pus) in your thick spit.  Your cough gets worse.  Your medicine does not reduce coughing, and you are losing sleep.  You cough up blood.  You have trouble breathing.  Your pain gets worse and medicine does not help.  You have a fever. MAKE SURE YOU:   Understand these instructions.  Will watch your condition.  Will get help right away if you are not doing well or get worse. Document Released: 06/07/2011 Document Revised: 12/17/2011 Document Reviewed: 06/07/2011 [facility] Patient Information 2014 [place], [place].  Smoking Cessation Quitting smoking is important to your health and has many advantages. However, it is not always easy to quit since nicotine is a very addictive drug. Often times, people try 3 times or more before being able to quit. This document explains the best ways for you to prepare to quit smoking. Quitting takes hard work and a lot of effort, but you can do it. ADVANTAGES OF QUITTING SMOKING  You will live longer, feel better, and live better.  Your body will feel the impact of quitting smoking almost immediately.  Within 20 minutes, blood pressure decreases. Your pulse returns to its normal level.  After 8 hours, carbon monoxide levels in the blood return to normal. Your oxygen level increases.  After 24 hours, the chance of having a heart attack starts to decrease. Your breath, hair, and body stop smelling like smoke.  After 48 hours, damaged nerve endings begin to recover. Your sense of taste and smell improve.  After 72 hours, the body is virtually free of nicotine. Your bronchial tubes relax and breathing becomes easier.  After 2 to 12 weeks, lungs can hold more air. Exercise becomes easier and circulation improves.  The risk of having a heart  attack, stroke, cancer, or lung disease is greatly reduced.  After 1 year, the risk of coronary heart disease is cut in half.  After 5 years, the risk of stroke falls to the same as a nonsmoker.  After 10 years, the risk of lung cancer is cut in half and the risk of other cancers decreases significantly.  After 15 years, the risk of coronary heart disease drops, usually to the level of a nonsmoker.  If you are pregnant, quitting smoking will improve your chances of having a healthy baby.  The people you live with, especially any children, will be healthier.  You will have extra money to spend on things other than cigarettes. QUESTIONS TO THINK ABOUT BEFORE ATTEMPTING TO QUIT You may want to talk about your answers with your caregiver.  Why do you want  to quit?  If you tried to quit in the past, what helped and what did not?  What will be the most difficult situations for you after you quit? How will you plan to handle them?  Who can help you through the tough times? Your family? Friends? A caregiver?  What pleasures do you get from smoking? What ways can you still get pleasure if you quit? Here are some questions to ask your caregiver:  How can you help me to be successful at quitting?  What medicine do you think would be best for me and how should I take it?  What should I do if I need more help?  What is smoking withdrawal like? How can I get information on withdrawal? GET READY  Set a quit date.  Change your environment by getting rid of all cigarettes, ashtrays, matches, and lighters in your home, car, or work. Do not let people smoke in your home.  Review your past attempts to quit. Think about what worked and what did not. GET SUPPORT AND ENCOURAGEMENT You have a better chance of being successful if you have help. You can get support in many ways.  Tell your family, friends, and co-workers that you are going to quit and need their support. Ask them not to smoke  around you.  Get individual, group, or telephone counseling and support. Programs are available at [employer] and health centers. Call your local health department for information about programs in your area.  Spiritual beliefs and practices may help some smokers quit.  Download a "quit meter" on your computer to keep track of quit statistics, such as how long you have gone without smoking, cigarettes not smoked, and money saved.  Get a self-help book about quitting smoking and staying off of tobacco. [place] yourself from urges to smoke. Talk to someone, go for a walk, or occupy your time with a task.  Change your normal routine. Take a different route to work. Drink tea instead of coffee. Eat breakfast in a different place.  Reduce your stress. Take a hot bath, exercise, or read a book.  Plan something enjoyable to do every day. Reward yourself for not smoking.  Explore interactive web-based programs that specialize in helping you quit. GET MEDICINE AND USE IT CORRECTLY Medicines can help you stop smoking and decrease the urge to smoke. Combining medicine with the above behavioral methods and support can greatly increase your chances of successfully quitting smoking.  Nicotine replacement therapy helps deliver nicotine to your body without the negative effects and risks of smoking. Nicotine replacement therapy includes nicotine gum, lozenges, inhalers, nasal sprays, and skin patches. Some may be available over-the-counter and others require a prescription.  Antidepressant medicine helps people abstain from smoking, but how this works is unknown. This medicine is available by prescription.  Nicotinic receptor partial agonist medicine simulates the effect of nicotine in your brain. This medicine is available by prescription. Ask your caregiver for advice about which medicines to use and how to use them based on your health history. Your caregiver will  tell you what side effects to look out for if you choose to be on a medicine or therapy. Carefully read the information on the package. Do not use any other product containing nicotine while using a nicotine replacement product.  RELAPSE OR DIFFICULT SITUATIONS Most relapses occur within the first 3 months after quitting. Do not be discouraged if you start smoking again. Remember, most  people try several times before finally quitting. You may have symptoms of withdrawal because your body is used to nicotine. You may crave cigarettes, be irritable, feel very hungry, cough often, get headaches, or have difficulty concentrating. The withdrawal symptoms are only temporary. They are strongest when you first quit, but they will go away within 10 14 days. To reduce the chances of relapse, try to:  Avoid drinking alcohol. Drinking lowers your chances of successfully quitting.  Reduce the amount of caffeine you consume. Once you quit smoking, the amount of caffeine in your body increases and can give you symptoms, such as a rapid heartbeat, sweating, and anxiety.  Avoid smokers because they can make you want to smoke.  Do not let weight gain distract you. Many smokers will gain weight when they quit, usually less than 10 pounds. Eat a healthy diet and stay active. You can always lose the weight gained after you quit.  Find ways to improve your mood other than smoking. FOR MORE INFORMATION  www.smokefree.gov  Document Released: 09/18/2001 Document Revised: 03/25/2012 Document Reviewed: 01/03/2012 [facility] Patient Information 2014 [place].

## 2013-12-04 NOTE — ED Provider Notes (Signed)
Medical screening examination/treatment/procedure(s) were performed by non-physician practitioner and as supervising physician I was immediately available for consultation/collaboration.  Philipp Deputy, M.D.   Harden Mo, MD 12/04/13 [phone]

## 2013-12-04 NOTE — ED Notes (Signed)
Cough x 1 month, no better w  OTC medications

## 2013-12-04 NOTE — ED Provider Notes (Signed)
CSN: [id number]     Arrival date & time 12/04/13  1309 History   First MD Initiated Contact with Patient 12/04/13 1450     Chief Complaint  Patient presents with  . Cough   (Consider location/radiation/quality/duration/timing/severity/associated sxs/prior Treatment) HPI Comments: 63 year old female appearing at least 84 years older than stated age he is complaining of a cough for one month. She has a history of COPD, chronic bronchitis and smoking. She is down to 3 cigarettes a day from one pack a day for several decades. She states she has been using her albuterol HFA and albuterol nebulizer without significant benefit. She says that she has been feeling cold at times he calls that a fever. No objective measurement of elevated temperature. This was preceded by URI symptoms approximately one month ago and a clear runny nose, sneezing and cough. The symptoms have abated and he only remaining symptom is cough.   Past Medical History  Diagnosis Date  . COPD (chronic obstructive pulmonary disease)   . Bronchitis   . High cholesterol    Past Surgical History  Procedure Laterality Date  . Abdominal hysterectomy     History reviewed. No pertinent family history. History  Substance Use Topics  . Smoking status: Current Every Day Smoker -- 0.50 packs/day  . Smokeless tobacco: Current User  . Alcohol Use: No   OB History   Grav Para Term Preterm Abortions TAB SAB Ect Mult Living                 Review of Systems  Constitutional: Positive for activity change. Negative for diaphoresis and fatigue.  HENT: Positive for sore throat. Negative for congestion, ear discharge, postnasal drip, rhinorrhea and sneezing.   Eyes: Negative.   Respiratory: Positive for cough, shortness of breath and wheezing.   Cardiovascular: Negative for chest pain.  Gastrointestinal: Negative.   Genitourinary: Negative.   Neurological: Negative.     Allergies  Hydrocodone and Propoxyphene  n-acetaminophen  Home Medications   Current Outpatient Rx  Name  Route  Sig  Dispense  Refill  . acetaminophen (TYLENOL) 500 MG tablet   Oral   Take 1,500 mg by mouth every 4 (four) hours as needed. For headache         . albuterol (PROVENTIL HFA;VENTOLIN HFA) 108 (90 BASE) MCG/ACT inhaler   Inhalation   Inhale 2 puffs into the lungs every 4 (four) hours as needed for wheezing or shortness of breath. For shortness of breath   1 Inhaler   4   . albuterol (PROVENTIL) (2.5 MG/3ML) 0.083% nebulizer solution   Nebulization   Take 3 mLs (2.5 mg total) by nebulization every 6 (six) hours as needed for wheezing.   150 mL   1   . diclofenac (CATAFLAM) 50 MG tablet   Oral   Take 1 tablet (50 mg total) by mouth 3 (three) times daily. As needed for neck pain. Take with food.   21 tablet   0   . Fluticasone-Salmeterol (ADVAIR) 100-50 MCG/DOSE AEPB   Inhalation   Inhale 1 puff into the lungs 2 (two) times daily.   1 each   3   . loratadine (CLARITIN) 10 MG tablet   Oral   Take 1 tablet (10 mg total) by mouth daily.   30 tablet   3   . mometasone (NASONEX) 50 MCG/ACT nasal spray   Nasal   Place 2 sprays into the nose daily.   17 g   3   .  Multiple Vitamin (MULITIVITAMIN WITH MINERALS) TABS   Oral   Take 1 tablet by mouth daily.         Marland Kitchen OVER THE COUNTER MEDICATION   Oral   Take 1 capsule by mouth daily. Megared omega 3 and krill oil         . predniSONE (DELTASONE) 10 MG tablet   Oral   Take 1 tablet (10 mg total) by mouth daily.   10 tablet   0   . tiotropium (SPIRIVA) 18 MCG inhalation capsule   Inhalation   Place 1 capsule (18 mcg total) into inhaler and inhale daily.   30 capsule   3   . traMADol (ULTRAM) 50 MG tablet   Oral   Take 1 tablet (50 mg total) by mouth every 6 (six) hours as needed for pain.   15 tablet   0   . albuterol (PROVENTIL HFA) 108 (90 BASE) MCG/ACT inhaler   Inhalation   Inhale 2 puffs into the lungs every 6 (six) hours as  needed for wheezing or shortness of breath.   1 Inhaler   0   . methylPREDNISolone (MEDROL DOSEPAK) 4 MG tablet      follow package directions   21 tablet   0    BP 130/65  Pulse 94  Temp(Src) 98.6 F (37 C) (Oral)  Resp 18  SpO2 95% Physical Exam  Nursing note and vitals reviewed. Constitutional: She is oriented to person, place, and time. She appears well-developed and well-nourished. No distress.  HENT:  Mouth/Throat: No oropharyngeal exudate.  Bilateral TMs are normal Oropharynx with moderate erythema and clear PND.  Eyes: Conjunctivae and EOM are normal.  Neck: Normal range of motion. Neck supple.  Cardiovascular: Normal rate, regular rhythm and normal heart sounds.   Pulmonary/Chest: Effort normal. She has no rales.  Patient is able to take a deep breath however expiratory phase is prolonged and there is no audible air movement. No wheezing except with a forced cough.  Musculoskeletal: She exhibits no edema.  Lymphadenopathy:    She has no cervical adenopathy.  Neurological: She is alert and oriented to person, place, and time.  Skin: Skin is warm and dry.  Psychiatric: She has a normal mood and affect.    ED Course  Procedures (including critical care time) Labs Review Labs Reviewed - No data to display Imaging Review No results found.   MDM   1. COPD exacerbation   2. Tobacco abuse     Post DuoNeb the patient states she is breathing better with less cough. Air movement has mildly improved. I suspect this is close to her baseline. Medrol Dosepak Refill the albuterol HFA Continued the Advair and Jacob Moores, NP 12/04/13 [phone]

## 2013-12-28 ENCOUNTER — Other Ambulatory Visit: Payer: Self-pay | Admitting: Emergency Medicine

## 2013-12-28 ENCOUNTER — Ambulatory Visit: Payer: BC Managed Care – PPO | Attending: Internal Medicine | Admitting: Internal Medicine

## 2013-12-28 VITALS — BP 123/79 | HR 87 | Temp 99.0°F | Resp 14 | Ht 64.0 in | Wt 115.0 lb

## 2013-12-28 DIAGNOSIS — J441 Chronic obstructive pulmonary disease with (acute) exacerbation: Secondary | ICD-10-CM

## 2013-12-28 DIAGNOSIS — E785 Hyperlipidemia, unspecified: Secondary | ICD-10-CM | POA: Insufficient documentation

## 2013-12-28 DIAGNOSIS — K219 Gastro-esophageal reflux disease without esophagitis: Secondary | ICD-10-CM | POA: Insufficient documentation

## 2013-12-28 DIAGNOSIS — Z79899 Other long term (current) drug therapy: Secondary | ICD-10-CM | POA: Insufficient documentation

## 2013-12-28 DIAGNOSIS — J42 Unspecified chronic bronchitis: Secondary | ICD-10-CM | POA: Insufficient documentation

## 2013-12-28 DIAGNOSIS — R059 Cough, unspecified: Secondary | ICD-10-CM | POA: Insufficient documentation

## 2013-12-28 DIAGNOSIS — R05 Cough: Secondary | ICD-10-CM | POA: Insufficient documentation

## 2013-12-28 DIAGNOSIS — R42 Dizziness and giddiness: Secondary | ICD-10-CM | POA: Insufficient documentation

## 2013-12-28 DIAGNOSIS — F17200 Nicotine dependence, unspecified, uncomplicated: Secondary | ICD-10-CM | POA: Insufficient documentation

## 2013-12-28 DIAGNOSIS — E7800 Pure hypercholesterolemia, unspecified: Secondary | ICD-10-CM | POA: Insufficient documentation

## 2013-12-28 MED ORDER — ALBUTEROL SULFATE HFA 108 (90 BASE) MCG/ACT IN AERS: 2.0000 | INHALATION_SPRAY | Freq: Four times a day (QID) | RESPIRATORY_TRACT | Status: DC | PRN

## 2013-12-28 MED ORDER — MECLIZINE HCL 12.5 MG PO TABS: 12.5000 mg | ORAL_TABLET | Freq: Three times a day (TID) | ORAL | Status: DC | PRN

## 2013-12-28 MED ORDER — NICOTINE 14 MG/24HR TD PT24: 14.0000 mg | MEDICATED_PATCH | Freq: Every day | TRANSDERMAL | Status: DC

## 2013-12-28 MED ORDER — TIOTROPIUM BROMIDE MONOHYDRATE 18 MCG IN CAPS: 18.0000 ug | ORAL_CAPSULE | Freq: Every day | RESPIRATORY_TRACT | Status: DC

## 2013-12-28 MED ORDER — GUAIFENESIN-CODEINE 100-10 MG/5ML PO SOLN: 10.0000 mL | Freq: Four times a day (QID) | ORAL | Status: DC | PRN

## 2013-12-28 MED ORDER — ALBUTEROL SULFATE (2.5 MG/3ML) 0.083% IN NEBU: 2.5000 mg | INHALATION_SOLUTION | Freq: Four times a day (QID) | RESPIRATORY_TRACT | Status: DC | PRN

## 2013-12-28 MED ORDER — GUAIFENESIN-CODEINE 100-10 MG/5ML PO SOLN
10.0000 mL | Freq: Four times a day (QID) | ORAL | Status: DC | PRN
Start: 2013-12-28 — End: 2013-12-28

## 2013-12-28 MED ORDER — MOMETASONE FUROATE 50 MCG/ACT NA SUSP: 2.0000 | Freq: Every day | NASAL | Status: DC

## 2013-12-28 MED ORDER — TIOTROPIUM BROMIDE MONOHYDRATE 18 MCG IN CAPS
18.0000 ug | ORAL_CAPSULE | Freq: Every day | RESPIRATORY_TRACT | Status: DC
Start: 2013-12-28 — End: 2013-12-28

## 2013-12-28 MED ORDER — LORATADINE 10 MG PO TABS: 10.0000 mg | ORAL_TABLET | Freq: Every day | ORAL | Status: DC

## 2013-12-28 MED ORDER — ALBUTEROL SULFATE HFA 108 (90 BASE) MCG/ACT IN AERS
2.0000 | INHALATION_SPRAY | Freq: Four times a day (QID) | RESPIRATORY_TRACT | Status: DC | PRN
Start: 2013-12-28 — End: 2014-05-28

## 2013-12-28 MED ORDER — FLUTICASONE-SALMETEROL 100-50 MCG/DOSE IN AEPB
1.0000 | INHALATION_SPRAY | Freq: Two times a day (BID) | RESPIRATORY_TRACT | Status: DC
Start: 2013-12-28 — End: 2013-12-28

## 2013-12-28 MED ORDER — FLUTICASONE-SALMETEROL 100-50 MCG/DOSE IN AEPB
1.0000 | INHALATION_SPRAY | Freq: Two times a day (BID) | RESPIRATORY_TRACT | Status: DC
Start: 2013-12-28 — End: 2015-01-21

## 2013-12-28 MED ORDER — MOMETASONE FUROATE 50 MCG/ACT NA SUSP
2.0000 | Freq: Every day | NASAL | Status: DC
Start: 2013-12-28 — End: 2013-12-28

## 2013-12-28 MED ORDER — FLUTICASONE-SALMETEROL 100-50 MCG/DOSE IN AEPB: 1.0000 | INHALATION_SPRAY | Freq: Two times a day (BID) | RESPIRATORY_TRACT | Status: DC

## 2013-12-28 NOTE — Addendum Note (Signed)
Addended by: Luberta Mutter R on: 12/28/2013 12:49 PM   Modules accepted: Orders, Medications

## 2013-12-28 NOTE — Progress Notes (Signed)
Patient ID: Margaret Henry, female   DOB: Jun 30, 1951, 63 y.o.   MRN: [id number]   CC:  HPI:  63 year old female here for evaluation of a chronic cough which has been ongoing since January. The patient was in urgent care and 2/27 with a productive cough and was treated with IV Solu-Medrol nebulizer and prescribed Medrol Dosepak. The patient started using a humidifier at home and states that the symptoms have improved significantly. She is currently smoking 2-3 cigarettes a day and her symptoms are controlled with albuterol inhaler. She occasionally gets short winded by walking short distances. She also complains of dizziness/vertigo when she wakes up in the morning. This has become more frequent. This is not a new problem for her. She has had these symptoms before and has tried meclizine in the past.   Allergies  Allergen Reactions  . Hydrocodone Nausea And Vomiting  . Propoxyphene N-Acetaminophen Nausea And Vomiting   Past Medical History  Diagnosis Date  . COPD (chronic obstructive pulmonary disease)   . Bronchitis   . High cholesterol    Current Outpatient Prescriptions on File Prior to Visit  Medication Sig Dispense Refill  . acetaminophen (TYLENOL) 500 MG tablet Take 1,500 mg by mouth every 4 (four) hours as needed. For headache      . albuterol (PROVENTIL HFA;VENTOLIN HFA) 108 (90 BASE) MCG/ACT inhaler Inhale 2 puffs into the lungs every 4 (four) hours as needed for wheezing or shortness of breath. For shortness of breath  1 Inhaler  4  . Multiple Vitamin (MULITIVITAMIN WITH MINERALS) TABS Take 1 tablet by mouth daily.      . diclofenac (CATAFLAM) 50 MG tablet Take 1 tablet (50 mg total) by mouth 3 (three) times daily. As needed for neck pain. Take with food.  21 tablet  0  . OVER THE COUNTER MEDICATION Take 1 capsule by mouth daily. Megared omega 3 and krill oil      . traMADol (ULTRAM) 50 MG tablet Take 1 tablet (50 mg total) by mouth every 6 (six) hours as needed for pain.  15  tablet  0  . [DISCONTINUED] simvastatin (ZOCOR) 80 MG tablet Take 80 mg by mouth at bedtime.       No current facility-administered medications on file prior to visit.   History reviewed. No pertinent family history. History   Social History  . Marital Status: Widowed    Spouse Name: N/A    Number of Children: N/A  . Years of Education: N/A   Occupational History  . Not on file.   Social History Main Topics  . Smoking status: Current Every Day Smoker -- 0.50 packs/day  . Smokeless tobacco: Current User  . Alcohol Use: No  . Drug Use: No  . Sexual Activity:    Other Topics Concern  . Not on file   Social History Narrative  . No narrative on file    Review of Systems  Constitutional: Negative for fever, chills, diaphoresis, activity change, appetite change and fatigue.  HENT: Negative for ear pain, nosebleeds, congestion, facial swelling, rhinorrhea, neck pain, neck stiffness and ear discharge.   Eyes: Negative for pain, discharge, redness, itching and visual disturbance.  Respiratory: As in history of present illness   Cardiovascular: Negative for chest pain, palpitations and leg swelling.  Gastrointestinal: Negative for abdominal distention.  Genitourinary: Negative for dysuria, urgency, frequency, hematuria, flank pain, decreased urine volume, difficulty urinating and dyspareunia.  Musculoskeletal: Negative for back pain, joint swelling, arthralgias and gait problem.  Neurological: Negative for dizziness, tremors, seizures, syncope, facial asymmetry, speech difficulty, weakness, light-headedness, numbness and headaches.  Hematological: Negative for adenopathy. Does not bruise/bleed easily.  Psychiatric/Behavioral: Negative for hallucinations, behavioral problems, confusion, dysphoric mood, decreased concentration and agitation.    Objective:   Filed Vitals:   12/28/13 1106  BP: 123/79  Pulse: 87  Temp: 99 F (37.2 C)  Resp: 14    Physical Exam   Constitutional: Appears well-developed and well-nourished. No distress.  HENT: Normocephalic. External right and left ear normal. Oropharynx is clear and moist.  Eyes: Conjunctivae and EOM are normal. PERRLA, no scleral icterus.  Neck: Normal ROM. Neck supple. No JVD. No tracheal deviation. No thyromegaly.  CVS: RRR, S1/S2 +, no murmurs, no gallops, no carotid bruit.  Pulmonary: Effort and breath sounds normal, no stridor, rhonchi, wheezes, rales.  Abdominal: Soft. BS +,  no distension, tenderness, rebound or guarding.  Musculoskeletal: Normal range of motion. No edema and no tenderness.  Lymphadenopathy: No lymphadenopathy noted, cervical, inguinal. Neuro: Alert. Normal reflexes, muscle tone coordination. No cranial nerve deficit. Skin: Skin is warm and dry. No rash noted. Not diaphoretic. No erythema. No pallor.  Psychiatric: Normal mood and affect. Behavior, judgment, thought content normal.   Lab Results  Component Value Date   WBC 7.3 07/07/2013   HGB 16.0* 07/07/2013   HCT 45.6 07/07/2013   MCV 91.4 07/07/2013   PLT 271 07/07/2013   Lab Results  Component Value Date   CREATININE 0.69 07/07/2013   BUN 10 07/07/2013   NA 140 07/07/2013   K 4.2 07/07/2013   CL 104 07/07/2013   CO2 27 07/07/2013    Lab Results  Component Value Date   HGBA1C 5.7* 11/24/2012   Lipid Panel     Component Value Date/Time   CHOL 260* 07/07/2013 1034   TRIG 341* 07/07/2013 1034   HDL 48 07/07/2013 1034   CHOLHDL 5.4 07/07/2013 1034   VLDL 68* 07/07/2013 1034   LDLCALC 144* 07/07/2013 1034       Assessment and plan:   Patient Active Problem List   Diagnosis Date Noted  . COPD exacerbation 06/01/2013  . UNSPECIFIED VITAMIN D DEFICIENCY 03/02/2010  . ALLERGIC RHINITIS 03/02/2010  . BACK PAIN, LUMBAR 03/02/2010  . TROCHANTERIC BURSITIS, BILATERAL 03/02/2010  . THYROID NODULE, RIGHT 11/09/2009  . DEPRESSION 11/09/2009  . COUGH 11/09/2009  . HYPERLIPIDEMIA 02/25/2009  . GERD 02/25/2009  .  OSTEOPOROSIS 01/30/2009  . COMPRESSION FRACTURE, SPINE 01/21/2009  . TOBACCO ABUSE 09/07/2008  . BRONCHITIS, CHRONIC 09/07/2008  . LUQ PAIN 09/07/2008  . GOITER, UNSPECIFIED 10/08/1997   Chronic cough due to chronic bronchitis, continued tobacco abuse Patient willing to quit smoking and would like to try nicotine patch Refills provided for nebulizer and her albuterol inhaler, Spiriva Patient will need a chest x-ray because of chronic cough Start the patient on Robitussin with codeine 2-D echo for dyspnea on exertion      Vertigo Start the patient on meclizine MRI of the brain 6/13 showed chronic microvascular ischemic changes No other focal neurologic symptoms No indication for further imaging followup in 2 months   The patient was given clear instructions to go to ER or return to medical center if symptoms don't improve, worsen or new problems develop. The patient verbalized understanding. The patient was told to call to get any lab results if not heard anything in the next week.

## 2013-12-28 NOTE — Progress Notes (Signed)
Pt is here following up on her COPD. 

## 2013-12-28 NOTE — Addendum Note (Signed)
Addended by: Candie Chroman D on: 12/28/2013 12:23 PM   Modules accepted: Orders, Medications

## 2014-01-05 ENCOUNTER — Ambulatory Visit (HOSPITAL_COMMUNITY)
Admission: RE | Admit: 2014-01-05 | Discharge: 2014-01-05 | Disposition: A | Discharge status: Home / Self Care | Payer: BC Managed Care – PPO | Source: Ambulatory Visit | Attending: Internal Medicine | Admitting: Internal Medicine

## 2014-01-05 ENCOUNTER — Other Ambulatory Visit (HOSPITAL_COMMUNITY): Payer: BC Managed Care – PPO

## 2014-01-05 ENCOUNTER — Telehealth: Payer: Self-pay | Admitting: Emergency Medicine

## 2014-01-05 DIAGNOSIS — J441 Chronic obstructive pulmonary disease with (acute) exacerbation: Secondary | ICD-10-CM

## 2014-01-05 DIAGNOSIS — R0602 Shortness of breath: Secondary | ICD-10-CM | POA: Insufficient documentation

## 2014-01-05 NOTE — Telephone Encounter (Signed)
Pt given chest xray results'

## 2014-01-05 NOTE — Telephone Encounter (Signed)
Message copied by Ricci Barker on Tue Jan 05, 2014  6:27 PM ------      Message from: Allyson Sabal MD, [facility]      Created: Tue Jan 05, 2014  4:27 PM       Notify patient that  chest x-ray shows changes related to COPD and smoking otherwise negative ------

## 2014-02-26 ENCOUNTER — Ambulatory Visit: Payer: BC Managed Care – PPO | Admitting: Internal Medicine

## 2014-05-28 ENCOUNTER — Telehealth: Payer: Self-pay | Admitting: Internal Medicine

## 2014-05-28 ENCOUNTER — Telehealth: Payer: Self-pay

## 2014-05-28 ENCOUNTER — Other Ambulatory Visit: Payer: Self-pay | Admitting: Emergency Medicine

## 2014-05-28 ENCOUNTER — Telehealth: Payer: Self-pay | Admitting: Emergency Medicine

## 2014-05-28 DIAGNOSIS — J441 Chronic obstructive pulmonary disease with (acute) exacerbation: Secondary | ICD-10-CM

## 2014-05-28 MED ORDER — LORATADINE 10 MG PO TABS
10.0000 mg | ORAL_TABLET | Freq: Every day | ORAL | Status: DC
Start: 2014-05-28 — End: 2014-05-28

## 2014-05-28 MED ORDER — ALBUTEROL SULFATE HFA 108 (90 BASE) MCG/ACT IN AERS: 2.0000 | INHALATION_SPRAY | Freq: Four times a day (QID) | RESPIRATORY_TRACT | Status: DC | PRN

## 2014-05-28 MED ORDER — LORATADINE 10 MG PO TABS
10.0000 mg | ORAL_TABLET | Freq: Every day | ORAL | Status: DC
Start: 2014-05-28 — End: 2015-05-24

## 2014-05-28 NOTE — Telephone Encounter (Signed)
Pt. Would like to get refills on loratadine (CLARITIN) 10 MG tablet and albuterol (PROVENTIL HFA) 108 (90 BASE). Pt. Would like it sent to [facility] in [place]. Ph: [phone]. Please f/u with pt

## 2014-05-28 NOTE — Telephone Encounter (Signed)
Left message for pt medications Albuterol inhaler and Claritin sent [facility] pharmacy

## 2014-07-23 ENCOUNTER — Other Ambulatory Visit: Payer: Self-pay

## 2014-08-11 ENCOUNTER — Other Ambulatory Visit: Payer: Self-pay | Admitting: Emergency Medicine

## 2014-08-11 DIAGNOSIS — J441 Chronic obstructive pulmonary disease with (acute) exacerbation: Secondary | ICD-10-CM

## 2014-08-11 MED ORDER — MOMETASONE FUROATE 50 MCG/ACT NA SUSP: 2.0000 | Freq: Every day | NASAL | Status: DC

## 2014-10-05 ENCOUNTER — Other Ambulatory Visit: Payer: Self-pay | Admitting: Internal Medicine

## 2014-10-06 ENCOUNTER — Other Ambulatory Visit: Payer: Self-pay | Admitting: Internal Medicine

## 2014-10-26 ENCOUNTER — Other Ambulatory Visit: Payer: Self-pay | Admitting: Internal Medicine

## 2014-11-28 ENCOUNTER — Other Ambulatory Visit: Payer: Self-pay | Admitting: Internal Medicine

## 2014-12-23 ENCOUNTER — Encounter (HOSPITAL_BASED_OUTPATIENT_CLINIC_OR_DEPARTMENT_OTHER): Payer: Self-pay | Admitting: *Deleted

## 2014-12-23 ENCOUNTER — Emergency Department (HOSPITAL_BASED_OUTPATIENT_CLINIC_OR_DEPARTMENT_OTHER)
Admission: EM | Admit: 2014-12-23 | Discharge: 2014-12-23 | Discharge status: Against Medical Advice | Payer: BLUE CROSS/BLUE SHIELD | Attending: Emergency Medicine | Admitting: Emergency Medicine

## 2014-12-23 ENCOUNTER — Emergency Department (HOSPITAL_BASED_OUTPATIENT_CLINIC_OR_DEPARTMENT_OTHER): Payer: BLUE CROSS/BLUE SHIELD

## 2014-12-23 DIAGNOSIS — J449 Chronic obstructive pulmonary disease, unspecified: Secondary | ICD-10-CM | POA: Insufficient documentation

## 2014-12-23 DIAGNOSIS — Z720 Tobacco use: Secondary | ICD-10-CM | POA: Diagnosis not present

## 2014-12-23 NOTE — ED Notes (Signed)
Notified that the patient left out of the room with belongings. MD notified.

## 2014-12-23 NOTE — ED Notes (Signed)
Reports out of meds x 2 months for COPD- cough-c/o feeling SOB- eval by RT in triage

## 2014-12-23 NOTE — ED Notes (Signed)
Pt states she needs a refill for her spiriva or any other medication that will allow her to breath better. States her current nebulizer tx are not working.

## 2014-12-28 ENCOUNTER — Other Ambulatory Visit: Payer: Self-pay | Admitting: Internal Medicine

## 2015-01-12 ENCOUNTER — Other Ambulatory Visit: Payer: Self-pay | Admitting: Internal Medicine

## 2015-01-13 ENCOUNTER — Telehealth: Payer: Self-pay | Admitting: Internal Medicine

## 2015-01-13 NOTE — Telephone Encounter (Signed)
Pt requesting medication refill for inhaler, pt has appt coming up with PCP.

## 2015-01-14 ENCOUNTER — Telehealth: Payer: Self-pay | Admitting: General Practice

## 2015-01-14 ENCOUNTER — Other Ambulatory Visit: Payer: Self-pay | Admitting: Physician Assistant

## 2015-01-14 MED ORDER — ALBUTEROL SULFATE HFA 108 (90 BASE) MCG/ACT IN AERS: 2.0000 | INHALATION_SPRAY | RESPIRATORY_TRACT | Status: DC | PRN

## 2015-01-14 NOTE — Telephone Encounter (Signed)
Patient called to request a med refill for her inhaler. Please f/u with pt.

## 2015-01-14 NOTE — Telephone Encounter (Signed)
Pt calling for medication refill for inhaler, please f/u with pt.

## 2015-01-14 NOTE — Telephone Encounter (Signed)
Completed. Refill on Ventolin Inhaler sent to [facility]. 1 refill.

## 2015-01-21 ENCOUNTER — Ambulatory Visit: Payer: BLUE CROSS/BLUE SHIELD | Attending: Internal Medicine | Admitting: Internal Medicine

## 2015-01-21 ENCOUNTER — Encounter: Payer: Self-pay | Admitting: Internal Medicine

## 2015-01-21 VITALS — BP 131/67 | HR 90 | Temp 98.2°F | Resp 16 | Ht 64.0 in | Wt 115.0 lb

## 2015-01-21 DIAGNOSIS — R059 Cough, unspecified: Secondary | ICD-10-CM

## 2015-01-21 DIAGNOSIS — J449 Chronic obstructive pulmonary disease, unspecified: Secondary | ICD-10-CM

## 2015-01-21 DIAGNOSIS — F17200 Nicotine dependence, unspecified, uncomplicated: Secondary | ICD-10-CM

## 2015-01-21 DIAGNOSIS — R05 Cough: Secondary | ICD-10-CM

## 2015-01-21 DIAGNOSIS — Z720 Tobacco use: Secondary | ICD-10-CM | POA: Diagnosis not present

## 2015-01-21 MED ORDER — TIOTROPIUM BROMIDE MONOHYDRATE 18 MCG IN CAPS: 18.0000 ug | ORAL_CAPSULE | Freq: Every day | RESPIRATORY_TRACT | Status: DC

## 2015-01-21 MED ORDER — BENZONATATE 100 MG PO CAPS: 100.0000 mg | ORAL_CAPSULE | Freq: Three times a day (TID) | ORAL | Status: DC | PRN

## 2015-01-21 MED ORDER — FLUTICASONE-SALMETEROL 100-50 MCG/DOSE IN AEPB: 1.0000 | INHALATION_SPRAY | Freq: Two times a day (BID) | RESPIRATORY_TRACT | Status: DC

## 2015-01-21 MED ORDER — ALBUTEROL SULFATE HFA 108 (90 BASE) MCG/ACT IN AERS: 2.0000 | INHALATION_SPRAY | RESPIRATORY_TRACT | Status: DC | PRN

## 2015-01-21 NOTE — Progress Notes (Signed)
Medicine refills Stated been out of Spiriva x3 month Cough worsen at night  Hx Tobacco - 7 Cigarette per day

## 2015-01-21 NOTE — Progress Notes (Signed)
MRN: [id number] Name: Margaret Henry  Sex: female Age: 64 y.o. DOB: 09-08-1951  Allergies: Hydrocodone and Propoxyphene n-acetaminophen  Chief Complaint  Patient presents with  . Follow-up    HPI: Patient is 64 y.o. female who has history of COPD, GERD , tobacco abuse, comes today requesting refill on her inhalers as per patient she then out of her Spiriva and Advair and is requiring to use albuterol more frequently, she still smokes cigarettes, I have counseled patient to quit smoking, she has occasional coughing with minimal whitish sputum, denies any fever chills.  Past Medical History  Diagnosis Date  . COPD (chronic obstructive pulmonary disease)   . Bronchitis   . High cholesterol     Past Surgical History  Procedure Laterality Date  . Abdominal hysterectomy        Medication List       This list is accurate as of: 01/21/15  4:48 PM.  Always use your most recent med list.               acetaminophen 500 MG tablet  Commonly known as:  TYLENOL  Take 1,500 mg by mouth every 4 (four) hours as needed. For headache     albuterol (2.5 MG/3ML) 0.083% nebulizer solution  Commonly known as:  PROVENTIL  USE ONE VIAL IN NEBULIZER EVERY 6 HOURS AS NEEDED FOR WHEEZING     albuterol 108 (90 BASE) MCG/ACT inhaler  Commonly known as:  VENTOLIN HFA  Inhale 2 puffs into the lungs every 4 (four) hours as needed for wheezing or shortness of breath.     aspirin 81 MG tablet  Take 81 mg by mouth daily.     benzonatate 100 MG capsule  Commonly known as:  TESSALON  Take 1 capsule (100 mg total) by mouth 3 (three) times daily as needed for cough.     diclofenac 50 MG tablet  Commonly known as:  CATAFLAM  Take 1 tablet (50 mg total) by mouth 3 (three) times daily. As needed for neck pain. Take with food.     Fluticasone-Salmeterol 100-50 MCG/DOSE Aepb  Commonly known as:  ADVAIR  Inhale 1 puff into the lungs 2 (two) times daily.     guaiFENesin-codeine 100-10 MG/5ML  syrup  Take 10 mLs by mouth every 6 (six) hours as needed for cough.     loratadine 10 MG tablet  Commonly known as:  CLARITIN  Take 1 tablet (10 mg total) by mouth daily.     meclizine 12.5 MG tablet  Commonly known as:  ANTIVERT  Take 1 tablet (12.5 mg total) by mouth 3 (three) times daily as needed for dizziness.     mometasone 50 MCG/ACT nasal spray  Commonly known as:  NASONEX  Place 2 sprays into the nose daily.     multivitamin with minerals Tabs tablet  Take 1 tablet by mouth daily.     nicotine 14 mg/24hr patch  Commonly known as:  NICODERM CQ - dosed in mg/24 hours  Place 1 patch (14 mg total) onto the skin daily.     OVER THE COUNTER MEDICATION  Take 1 capsule by mouth daily. Megared omega 3 and krill oil     tiotropium 18 MCG inhalation capsule  Commonly known as:  SPIRIVA  Place 1 capsule (18 mcg total) into inhaler and inhale daily.     traMADol 50 MG tablet  Commonly known as:  ULTRAM  Take 1 tablet (50 mg total) by mouth every 6 (  six) hours as needed for pain.        Meds ordered this encounter  Medications  . tiotropium (SPIRIVA) 18 MCG inhalation capsule    Sig: Place 1 capsule (18 mcg total) into inhaler and inhale daily.    Dispense:  30 capsule    Refill:  6  . Fluticasone-Salmeterol (ADVAIR) 100-50 MCG/DOSE AEPB    Sig: Inhale 1 puff into the lungs 2 (two) times daily.    Dispense:  1 each    Refill:  5  . albuterol (VENTOLIN HFA) 108 (90 BASE) MCG/ACT inhaler    Sig: Inhale 2 puffs into the lungs every 4 (four) hours as needed for wheezing or shortness of breath.    Dispense:  18 each    Refill:  3  . benzonatate (TESSALON) 100 MG capsule    Sig: Take 1 capsule (100 mg total) by mouth 3 (three) times daily as needed for cough.    Dispense:  30 capsule    Refill:  1    Immunization History  Administered Date(s) Administered  . Influenza Split 07/07/2013  . Influenza Whole 11/09/2009  . Pneumococcal Polysaccharide-23 10/08/1998  . Td  10/08/2002    History reviewed. No pertinent family history.  History  Substance Use Topics  . Smoking status: Current Every Day Smoker -- 0.50 packs/day    Types: Cigarettes  . Smokeless tobacco: Current User  . Alcohol Use: No    Review of Systems   As noted in HPI  Filed Vitals:   01/21/15 1518  BP: 131/67  Pulse: 90  Temp: 98.2 F (36.8 C)  Resp: 16    Physical Exam  Physical Exam  Constitutional: No distress.  Eyes: EOM are normal. Pupils are equal, round, and reactive to light.  Cardiovascular: Normal rate and regular rhythm.   Pulmonary/Chest: She has no rales.  Minimal wheezing  Musculoskeletal: She exhibits no edema.    CBC    Component Value Date/Time   WBC 7.3 07/07/2013 1034   RBC 4.99 07/07/2013 1034   HGB 16.0* 07/07/2013 1034   HCT 45.6 07/07/2013 1034   PLT 271 07/07/2013 1034   MCV 91.4 07/07/2013 1034   LYMPHSABS 2.9 08/31/2010 1840   MONOABS 0.6 08/31/2010 1840   EOSABS 0.2 08/31/2010 1840   BASOSABS 0.2* 08/31/2010 1840    CMP     Component Value Date/Time   NA 140 07/07/2013 1034   K 4.2 07/07/2013 1034   CL 104 07/07/2013 1034   CO2 27 07/07/2013 1034   GLUCOSE 86 07/07/2013 1034   BUN 10 07/07/2013 1034   CREATININE 0.69 07/07/2013 1034   CREATININE 0.58 11/24/2012 1020   CALCIUM 10.0 07/07/2013 1034   PROT 7.8 07/07/2013 1034   ALBUMIN 4.9 07/07/2013 1034   AST 16 07/07/2013 1034   ALT 12 07/07/2013 1034   ALKPHOS 65 07/07/2013 1034   BILITOT 0.5 07/07/2013 1034   GFRNONAA >89 07/07/2013 1034   GFRNONAA >90 11/24/2012 1020   GFRAA >89 07/07/2013 1034   GFRAA >90 11/24/2012 1020    Lab Results  Component Value Date/Time   CHOL 260* 07/07/2013 10:34 AM    Lab Results  Component Value Date/Time   HGBA1C 5.7* 11/24/2012 10:20 AM    Lab Results  Component Value Date/Time   AST 16 07/07/2013 10:34 AM    Assessment and Plan  Chronic obstructive pulmonary disease, unspecified COPD, unspecified chronic  bronchitis type - Plan:patient is resumed back on  tiotropium (SPIRIVA) 18 MCG inhalation  capsule, Fluticasone-Salmeterol (ADVAIR) 100-50 MCG/DOSE AEPB, albuterol (VENTOLIN HFA) 108 (90 BASE) MCG/ACT inhaler  As per patient soon she will have Medicare insurance At that point will do baseline blood work and she'll be referred to pulmonology.  Tobacco use disorder Consultation to quit smoking  Cough - Plan: benzonatate (TESSALON) 100 MG capsule   Return in about 3 months (around 04/22/2015), or if symptoms worsen or fail to improve.   This note has been created with Surveyor, quantity. Any transcriptional errors are unintentional.    Lorayne Marek, MD

## 2015-02-07 ENCOUNTER — Other Ambulatory Visit: Payer: Self-pay | Admitting: *Deleted

## 2015-02-07 DIAGNOSIS — J449 Chronic obstructive pulmonary disease, unspecified: Secondary | ICD-10-CM

## 2015-02-07 MED ORDER — ALBUTEROL SULFATE HFA 108 (90 BASE) MCG/ACT IN AERS: 2.0000 | INHALATION_SPRAY | RESPIRATORY_TRACT | Status: DC | PRN

## 2015-02-07 NOTE — Telephone Encounter (Signed)
Patient called in wanting Rx for Ventolin sent to the [facility] in [place]

## 2015-03-18 ENCOUNTER — Other Ambulatory Visit: Payer: Self-pay | Admitting: Internal Medicine

## 2015-03-22 ENCOUNTER — Other Ambulatory Visit: Payer: Self-pay | Admitting: Internal Medicine

## 2015-03-29 ENCOUNTER — Other Ambulatory Visit: Payer: Self-pay | Admitting: Pharmacist

## 2015-03-29 ENCOUNTER — Other Ambulatory Visit: Payer: Self-pay

## 2015-03-29 MED ORDER — ALBUTEROL SULFATE HFA 108 (90 BASE) MCG/ACT IN AERS: 2.0000 | INHALATION_SPRAY | Freq: Four times a day (QID) | RESPIRATORY_TRACT | Status: DC | PRN

## 2015-03-29 MED ORDER — ALBUTEROL SULFATE (2.5 MG/3ML) 0.083% IN NEBU
2.5000 mg | INHALATION_SOLUTION | Freq: Four times a day (QID) | RESPIRATORY_TRACT | Status: DC | PRN
Start: 2015-03-29 — End: 2015-12-05

## 2015-03-29 MED ORDER — TIOTROPIUM BROMIDE MONOHYDRATE 2.5 MCG/ACT IN AERS: 2.0000 | INHALATION_SPRAY | Freq: Every day | RESPIRATORY_TRACT | Status: DC

## 2015-04-14 NOTE — Telephone Encounter (Signed)
albuterol (VENTOLIN HFA) 108 (90 BASE) MCG/ACT inhaler

## 2015-04-15 NOTE — Telephone Encounter (Signed)
Received another fax today for a refill on Ventolin.

## 2015-04-19 ENCOUNTER — Ambulatory Visit: Payer: Self-pay | Attending: Internal Medicine | Admitting: Internal Medicine

## 2015-04-19 ENCOUNTER — Encounter: Payer: Self-pay | Admitting: Internal Medicine

## 2015-04-19 VITALS — BP 149/71 | HR 80 | Temp 98.0°F | Resp 16 | Wt 112.2 lb

## 2015-04-19 DIAGNOSIS — F17200 Nicotine dependence, unspecified, uncomplicated: Secondary | ICD-10-CM

## 2015-04-19 DIAGNOSIS — E785 Hyperlipidemia, unspecified: Secondary | ICD-10-CM | POA: Insufficient documentation

## 2015-04-19 DIAGNOSIS — R252 Cramp and spasm: Secondary | ICD-10-CM | POA: Insufficient documentation

## 2015-04-19 DIAGNOSIS — J449 Chronic obstructive pulmonary disease, unspecified: Secondary | ICD-10-CM | POA: Insufficient documentation

## 2015-04-19 DIAGNOSIS — Z720 Tobacco use: Secondary | ICD-10-CM | POA: Insufficient documentation

## 2015-04-19 DIAGNOSIS — Z833 Family history of diabetes mellitus: Secondary | ICD-10-CM | POA: Insufficient documentation

## 2015-04-19 LAB — COMPLETE METABOLIC PANEL WITH GFR
ALK PHOS: 58 U/L (ref 39–117)
ALT: 10 U/L (ref 0–35)
AST: 14 U/L (ref 0–37)
Albumin: 4.6 g/dL (ref 3.5–5.2)
BUN: 10 mg/dL (ref 6–23)
CALCIUM: 10 mg/dL (ref 8.4–10.5)
CHLORIDE: 105 meq/L (ref 96–112)
CO2: 27 mEq/L (ref 19–32)
CREATININE: 0.59 mg/dL (ref 0.50–1.10)
GFR, Est African American: >89 mL/min
GFR, Est Non African American: >89 mL/min
Glucose, Bld: 96 mg/dL (ref 70–99)
Potassium: 5 mEq/L (ref 3.5–5.3)
SODIUM: 143 meq/L (ref 135–145)
Total Bilirubin: 0.4 mg/dL (ref 0.2–1.2)
Total Protein: 7.3 g/dL (ref 6.0–8.3)

## 2015-04-19 LAB — CBC WITH DIFFERENTIAL/PLATELET
BASOS ABS: 0 10*3/uL (ref 0.0–0.1)
BASOS PCT: 0 % (ref 0–1)
Eosinophils Absolute: 0.3 10*3/uL (ref 0.0–0.7)
Eosinophils Relative: 4 % (ref 0–5)
HEMATOCRIT: 46.1 % — AB (ref 36.0–46.0)
Hemoglobin: 15.4 g/dL — ABNORMAL HIGH (ref 12.0–15.0)
LYMPHS ABS: 2.6 10*3/uL (ref 0.7–4.0)
LYMPHS PCT: 34 % (ref 12–46)
MCH: 31.4 pg (ref 26.0–34.0)
MCHC: 33.4 g/dL (ref 30.0–36.0)
MCV: 94.1 fL (ref 78.0–100.0)
MONO ABS: 0.5 10*3/uL (ref 0.1–1.0)
MONOS PCT: 7 % (ref 3–12)
MPV: 9.6 fL (ref 8.6–12.4)
NEUTROS ABS: 4.1 10*3/uL (ref 1.7–7.7)
NEUTROS PCT: 55 % (ref 43–77)
Platelets: 253 10*3/uL (ref 150–400)
RBC: 4.9 MIL/uL (ref 3.87–5.11)
RDW: 13.5 % (ref 11.5–15.5)
WBC: 7.5 10*3/uL (ref 4.0–10.5)

## 2015-04-19 LAB — LIPID PANEL
CHOL/HDL RATIO: 4.6 ratio
CHOLESTEROL: 205 mg/dL — AB (ref 0–200)
HDL: 45 mg/dL — AB (ref >=46)
LDL CALC: 129 mg/dL — AB (ref 0–99)
TRIGLYCERIDES: 156 mg/dL — AB (ref <150)
VLDL: 31 mg/dL (ref 0–40)

## 2015-04-19 LAB — TSH: TSH: 0.641 u[IU]/mL (ref 0.350–4.500)

## 2015-04-19 MED ORDER — ALBUTEROL SULFATE HFA 108 (90 BASE) MCG/ACT IN AERS: 2.0000 | INHALATION_SPRAY | Freq: Four times a day (QID) | RESPIRATORY_TRACT | Status: DC | PRN

## 2015-04-19 MED ORDER — FLUTICASONE-SALMETEROL 100-50 MCG/DOSE IN AEPB: 1.0000 | INHALATION_SPRAY | Freq: Two times a day (BID) | RESPIRATORY_TRACT | Status: DC

## 2015-04-19 NOTE — Progress Notes (Signed)
Patient here for her routine three month follow up Patient is fasting this am and would like blood work done Patient also requesting a refill on her medications

## 2015-04-19 NOTE — Progress Notes (Signed)
MRN: [id number] Name: Margaret Henry  Sex: female Age: 64 y.o. DOB: 07-02-1951  Allergies: Hydrocodone and Propoxyphene n-acetaminophen  Chief Complaint  Patient presents with  . Follow-up    HPI: Patient is 64 y.o. female who has history of COPD, hyperlipidemia, GERD comes today for followup, as per patient she is also trying to quit smoking and has started using nicotine patch, today she is requesting refill on her medications, also like to do the blood work, she's also complaining of bilateral leg cramps, denies any fever chills chest and shortness of breath.  Past Medical History  Diagnosis Date  . COPD (chronic obstructive pulmonary disease)   . Bronchitis   . High cholesterol     Past Surgical History  Procedure Laterality Date  . Abdominal hysterectomy        Medication List       This list is accurate as of: 04/19/15 10:32 AM.  Always use your most recent med list.               acetaminophen 500 MG tablet  Commonly known as:  TYLENOL  Take 1,500 mg by mouth every 4 (four) hours as needed. For headache     albuterol 108 (90 BASE) MCG/ACT inhaler  Commonly known as:  VENTOLIN HFA  Inhale 2 puffs into the lungs every 4 (four) hours as needed for wheezing or shortness of breath.     albuterol (2.5 MG/3ML) 0.083% nebulizer solution  Commonly known as:  PROVENTIL  Take 3 mLs (2.5 mg total) by nebulization every 6 (six) hours as needed for wheezing or shortness of breath.     albuterol (2.5 MG/3ML) 0.083% nebulizer solution  Commonly known as:  PROVENTIL  USE 1 VIAL IN NEBULIZER EVERY 6 HOURS AS NEEDED FOR WHEEZING     albuterol 108 (90 BASE) MCG/ACT inhaler  Commonly known as:  PROVENTIL HFA;VENTOLIN HFA  Inhale 2 puffs into the lungs every 6 (six) hours as needed for wheezing or shortness of breath.     aspirin 81 MG tablet  Take 81 mg by mouth daily.     benzonatate 100 MG capsule  Commonly known as:  TESSALON  Take 1 capsule (100 mg total) by  mouth 3 (three) times daily as needed for cough.     diclofenac 50 MG tablet  Commonly known as:  CATAFLAM  Take 1 tablet (50 mg total) by mouth 3 (three) times daily. As needed for neck pain. Take with food.     Fluticasone-Salmeterol 100-50 MCG/DOSE Aepb  Commonly known as:  ADVAIR  Inhale 1 puff into the lungs 2 (two) times daily.     guaiFENesin-codeine 100-10 MG/5ML syrup  Take 10 mLs by mouth every 6 (six) hours as needed for cough.     loratadine 10 MG tablet  Commonly known as:  CLARITIN  Take 1 tablet (10 mg total) by mouth daily.     meclizine 12.5 MG tablet  Commonly known as:  ANTIVERT  Take 1 tablet (12.5 mg total) by mouth 3 (three) times daily as needed for dizziness.     mometasone 50 MCG/ACT nasal spray  Commonly known as:  NASONEX  Place 2 sprays into the nose daily.     multivitamin with minerals Tabs tablet  Take 1 tablet by mouth daily.     nicotine 14 mg/24hr patch  Commonly known as:  NICODERM CQ - dosed in mg/24 hours  Place 1 patch (14 mg total) onto the skin daily.  OVER THE COUNTER MEDICATION  Take 1 capsule by mouth daily. Megared omega 3 and krill oil     Tiotropium Bromide Monohydrate 2.5 MCG/ACT Aers  Commonly known as:  SPIRIVA RESPIMAT  Inhale 2 puffs into the lungs daily.     traMADol 50 MG tablet  Commonly known as:  ULTRAM  Take 1 tablet (50 mg total) by mouth every 6 (six) hours as needed for pain.        Meds ordered this encounter  Medications  . albuterol (PROVENTIL HFA;VENTOLIN HFA) 108 (90 BASE) MCG/ACT inhaler    Sig: Inhale 2 puffs into the lungs every 6 (six) hours as needed for wheezing or shortness of breath.    Dispense:  1 Inhaler    Refill:  2    Pro air respiclick  . Fluticasone-Salmeterol (ADVAIR) 100-50 MCG/DOSE AEPB    Sig: Inhale 1 puff into the lungs 2 (two) times daily.    Dispense:  1 each    Refill:  5    Immunization History  Administered Date(s) Administered  . Influenza Split 07/07/2013  .  Influenza Whole 11/09/2009  . Pneumococcal Polysaccharide-23 10/08/1998  . Td 10/08/2002    History reviewed. No pertinent family history.  History  Substance Use Topics  . Smoking status: Current Every Day Smoker -- 0.50 packs/day    Types: Cigarettes  . Smokeless tobacco: Current User  . Alcohol Use: No    Review of Systems   As noted in HPI  Filed Vitals:   04/19/15 0926  BP: 149/71  Pulse: 80  Temp: 98 F (36.7 C)  Resp: 16    Physical Exam  Physical Exam  Constitutional: No distress.  Eyes: EOM are normal. Pupils are equal, round, and reactive to light.  Cardiovascular: Normal rate and regular rhythm.   Pulmonary/Chest: Breath sounds normal. No respiratory distress. She has no wheezes. She has no rales.  Musculoskeletal: She exhibits no edema.    CBC    Component Value Date/Time   WBC 7.3 07/07/2013 1034   RBC 4.99 07/07/2013 1034   HGB 16.0* 07/07/2013 1034   HCT 45.6 07/07/2013 1034   PLT 271 07/07/2013 1034   MCV 91.4 07/07/2013 1034   LYMPHSABS 2.9 08/31/2010 1840   MONOABS 0.6 08/31/2010 1840   EOSABS 0.2 08/31/2010 1840   BASOSABS 0.2* 08/31/2010 1840    CMP     Component Value Date/Time   NA 140 07/07/2013 1034   K 4.2 07/07/2013 1034   CL 104 07/07/2013 1034   CO2 27 07/07/2013 1034   GLUCOSE 86 07/07/2013 1034   BUN 10 07/07/2013 1034   CREATININE 0.69 07/07/2013 1034   CREATININE 0.58 11/24/2012 1020   CALCIUM 10.0 07/07/2013 1034   PROT 7.8 07/07/2013 1034   ALBUMIN 4.9 07/07/2013 1034   AST 16 07/07/2013 1034   ALT 12 07/07/2013 1034   ALKPHOS 65 07/07/2013 1034   BILITOT 0.5 07/07/2013 1034   GFRNONAA >89 07/07/2013 1034   GFRNONAA >90 11/24/2012 1020   GFRAA >89 07/07/2013 1034   GFRAA >90 11/24/2012 1020    Lab Results  Component Value Date/Time   CHOL 260* 07/07/2013 10:34 AM    Lab Results  Component Value Date/Time   HGBA1C 5.7* 11/24/2012 10:20 AM    Lab Results  Component Value Date/Time   AST 16  07/07/2013 10:34 AM    Assessment and Plan  Chronic obstructive pulmonary disease, unspecified COPD, unspecified chronic bronchitis type - Plan: .continue with Advair and Spiriva,  albuterol when necessary albuterol (PROVENTIL HFA;VENTOLIN HFA) 108 (90 BASE) MCG/ACT inhaler, Fluticasone-Salmeterol (ADVAIR) 100-50 MCG/DOSE AEPB  Tobacco use disorder Patient has started using nicotine patch . Hyperlipidemia - Plan: Will repeat fasting Lipid panel  Cramps of lower extremity, unspecified laterality - Plan: CBC with Differential/Platelet, COMPLETE METABOLIC PANEL WITH GFR, TSH, Vit D  25 hydroxy (rtn osteoporosis monitoring)  Family history of diabetes mellitus (DM) - Plan: Hemoglobin A1c    Return in about 3 months (around 07/20/2015), or if symptoms worsen or fail to improve.   This note has been created with Surveyor, quantity. Any transcriptional errors are unintentional.    Lorayne Marek, MD

## 2015-04-20 ENCOUNTER — Telehealth: Payer: Self-pay

## 2015-04-20 LAB — HEMOGLOBIN A1C
Hgb A1c MFr Bld: 5.6 % (ref <5.7)
Mean Plasma Glucose: 114 mg/dL (ref <117)

## 2015-04-20 LAB — VITAMIN D 25 HYDROXY (VIT D DEFICIENCY, FRACTURES): Vit D, 25-Hydroxy: 30 ng/mL (ref 30–100)

## 2015-04-20 NOTE — Telephone Encounter (Signed)
Patient is aware of her lab results 

## 2015-04-20 NOTE — Telephone Encounter (Signed)
-----   Message from Lorayne Marek, MD sent at 04/20/2015  9:36 AM EDT ----- Call and let the patient know that her A1C is improved and is in normal range, her cholesterol is improved but still elevated, advise patient to continue with low fat low carbohydrate diet

## 2015-05-06 ENCOUNTER — Emergency Department (HOSPITAL_BASED_OUTPATIENT_CLINIC_OR_DEPARTMENT_OTHER): Payer: Self-pay

## 2015-05-06 ENCOUNTER — Emergency Department (HOSPITAL_BASED_OUTPATIENT_CLINIC_OR_DEPARTMENT_OTHER)
Admission: EM | Admit: 2015-05-06 | Discharge: 2015-05-06 | Disposition: A | Discharge status: Home / Self Care | Payer: Self-pay | Attending: Emergency Medicine | Admitting: Emergency Medicine

## 2015-05-06 ENCOUNTER — Encounter (HOSPITAL_BASED_OUTPATIENT_CLINIC_OR_DEPARTMENT_OTHER): Payer: Self-pay

## 2015-05-06 DIAGNOSIS — Z79899 Other long term (current) drug therapy: Secondary | ICD-10-CM | POA: Insufficient documentation

## 2015-05-06 DIAGNOSIS — Z8639 Personal history of other endocrine, nutritional and metabolic disease: Secondary | ICD-10-CM | POA: Insufficient documentation

## 2015-05-06 DIAGNOSIS — Z7982 Long term (current) use of aspirin: Secondary | ICD-10-CM | POA: Insufficient documentation

## 2015-05-06 DIAGNOSIS — J449 Chronic obstructive pulmonary disease, unspecified: Secondary | ICD-10-CM

## 2015-05-06 DIAGNOSIS — Z7951 Long term (current) use of inhaled steroids: Secondary | ICD-10-CM | POA: Insufficient documentation

## 2015-05-06 DIAGNOSIS — J441 Chronic obstructive pulmonary disease with (acute) exacerbation: Secondary | ICD-10-CM | POA: Insufficient documentation

## 2015-05-06 DIAGNOSIS — Z720 Tobacco use: Secondary | ICD-10-CM | POA: Insufficient documentation

## 2015-05-06 MED ORDER — PREDNISONE 50 MG PO TABS
60.0000 mg | ORAL_TABLET | Freq: Once | ORAL | Status: AC
  Administered 2015-05-06: 60 mg via ORAL
  Filled 2015-05-06 (×2): qty 1

## 2015-05-06 MED ORDER — AZITHROMYCIN 250 MG PO TABS: ORAL_TABLET | ORAL | Status: DC

## 2015-05-06 MED ORDER — ALBUTEROL SULFATE HFA 108 (90 BASE) MCG/ACT IN AERS: 2.0000 | INHALATION_SPRAY | Freq: Four times a day (QID) | RESPIRATORY_TRACT | Status: DC | PRN

## 2015-05-06 MED ORDER — PREDNISONE 20 MG PO TABS: ORAL_TABLET | ORAL | Status: DC

## 2015-05-06 MED ORDER — IPRATROPIUM-ALBUTEROL 0.5-2.5 (3) MG/3ML IN SOLN
3.0000 mL | Freq: Four times a day (QID) | RESPIRATORY_TRACT | Status: DC
  Administered 2015-05-06: 3 mL via RESPIRATORY_TRACT
  Filled 2015-05-06: qty 3

## 2015-05-06 MED ORDER — ALBUTEROL SULFATE HFA 108 (90 BASE) MCG/ACT IN AERS
2.0000 | INHALATION_SPRAY | Freq: Four times a day (QID) | RESPIRATORY_TRACT | Status: DC
  Administered 2015-05-06: 2 via RESPIRATORY_TRACT
  Filled 2015-05-06: qty 6.7

## 2015-05-06 NOTE — ED Notes (Addendum)
Pt reports 3 days of cough, congestion, white foamy phlegm production, no fevers. States has had intermittent cough x 6 months.  Has had a small amount of weight loss reported.

## 2015-05-06 NOTE — Discharge Instructions (Signed)
Take prednisone as prescribed.  Take zpack as prescribed.  Use albuterol as needed for wheezing.   Follow up with your doctor.   Stop smoking.   Return to ER if you have trouble breathing, wheezing, chest pain.

## 2015-05-06 NOTE — ED Notes (Signed)
Patient transported to X-ray 

## 2015-05-06 NOTE — ED Provider Notes (Signed)
CSN: [id number]     Arrival date & time 05/06/15  1147 History   First MD Initiated Contact with Patient 05/06/15 1208     Chief Complaint  Patient presents with  . Cough     (Consider location/radiation/quality/duration/timing/severity/associated sxs/prior Treatment) The history is provided by the patient.  Margaret Henry is a 64 y.o. female hx of COPD, HL  Here presenting with cough, productive phlegm.  She has chronic cough from her COPD.  For the last 3 days she has more productive cough with foamy phlegm.  Symptoms chills but no fevers.  Denies any abdominal pain.  Patient recently quit smoking but then restarted smoking.  Patient noticed some weight loss for last several months but has been seen by her doctor.   Past Medical History  Diagnosis Date  . COPD (chronic obstructive pulmonary disease)   . Bronchitis   . High cholesterol    Past Surgical History  Procedure Laterality Date  . Abdominal hysterectomy     No family history on file. History  Substance Use Topics  . Smoking status: Light Tobacco Smoker -- 0.00 packs/day    Types: Cigarettes  . Smokeless tobacco: Current User  . Alcohol Use: No   OB History    No data available     Review of Systems  Respiratory: Positive for cough.   All other systems reviewed and are negative.     Allergies  Hydrocodone and Propoxyphene n-acetaminophen  Home Medications   Prior to Admission medications   Medication Sig Start Date End Date Taking? Authorizing Provider  acetaminophen (TYLENOL) 500 MG tablet Take 1,500 mg by mouth every 4 (four) hours as needed. For headache    Historical Provider, MD  albuterol (PROVENTIL HFA;VENTOLIN HFA) 108 (90 BASE) MCG/ACT inhaler Inhale 2 puffs into the lungs every 6 (six) hours as needed for wheezing or shortness of breath. 04/19/15   Lorayne Marek, MD  albuterol (PROVENTIL) (2.5 MG/3ML) 0.083% nebulizer solution USE 1 VIAL IN NEBULIZER EVERY 6 HOURS AS NEEDED FOR WHEEZING 04/15/15    Tresa Garter, MD  albuterol (PROVENTIL) (2.5 MG/3ML) 0.083% nebulizer solution Take 3 mLs (2.5 mg total) by nebulization every 6 (six) hours as needed for wheezing or shortness of breath. 03/29/15   Lorayne Marek, MD  albuterol (VENTOLIN HFA) 108 (90 BASE) MCG/ACT inhaler Inhale 2 puffs into the lungs every 4 (four) hours as needed for wheezing or shortness of breath. 02/07/15   Lorayne Marek, MD  aspirin 81 MG tablet Take 81 mg by mouth daily.    Historical Provider, MD  benzonatate (TESSALON) 100 MG capsule Take 1 capsule (100 mg total) by mouth 3 (three) times daily as needed for cough. 01/21/15   Lorayne Marek, MD  diclofenac (CATAFLAM) 50 MG tablet Take 1 tablet (50 mg total) by mouth 3 (three) times daily. As needed for neck pain. Take with food. Patient not taking: Reported on 01/21/2015 07/13/13   Janne Napoleon, NP  Fluticasone-Salmeterol (ADVAIR) 100-50 MCG/DOSE AEPB Inhale 1 puff into the lungs 2 (two) times daily. 04/19/15   Lorayne Marek, MD  guaiFENesin-codeine 100-10 MG/5ML syrup Take 10 mLs by mouth every 6 (six) hours as needed for cough. Patient not taking: Reported on 01/21/2015 12/28/13   Reyne Dumas, MD  loratadine (CLARITIN) 10 MG tablet Take 1 tablet (10 mg total) by mouth daily. 05/28/14   Tresa Garter, MD  meclizine (ANTIVERT) 12.5 MG tablet Take 1 tablet (12.5 mg total) by mouth 3 (three) times daily as  needed for dizziness. Patient not taking: Reported on 01/21/2015 12/28/13   Reyne Dumas, MD  mometasone (NASONEX) 50 MCG/ACT nasal spray Place 2 sprays into the nose daily. 08/11/14   Tresa Garter, MD  Multiple Vitamin (MULITIVITAMIN WITH MINERALS) TABS Take 1 tablet by mouth daily.    Historical Provider, MD  nicotine (NICODERM CQ - DOSED IN MG/24 HOURS) 14 mg/24hr patch Place 1 patch (14 mg total) onto the skin daily. 12/28/13   Reyne Dumas, MD  OVER THE COUNTER MEDICATION Take 1 capsule by mouth daily. Megared omega 3 and krill oil    Historical Provider, MD   Tiotropium Bromide Monohydrate (SPIRIVA RESPIMAT) 2.5 MCG/ACT AERS Inhale 2 puffs into the lungs daily. 03/29/15   Lorayne Marek, MD  traMADol (ULTRAM) 50 MG tablet Take 1 tablet (50 mg total) by mouth every 6 (six) hours as needed for pain. Patient not taking: Reported on 01/21/2015 07/13/13   Janne Napoleon, NP   BP 120/71 mmHg  Pulse 99  Temp(Src) 98 F (36.7 C) (Oral)  Resp 22  Ht 5\' 4"  (1.626 m)  Wt 111 lb (50.349 kg)  BMI 19.04 kg/m2  SpO2 95% Physical Exam  Constitutional: She is oriented to person, place, and time.  Chronically ill, coughing    HENT:  Head: Normocephalic.  Mouth/Throat: Oropharynx is clear and moist.  Eyes: Conjunctivae are normal. Pupils are equal, round, and reactive to light.  Neck: Normal range of motion. Neck supple.  Cardiovascular: Normal rate, regular rhythm and normal heart sounds.   Pulmonary/Chest: Effort normal and breath sounds normal.  Slightly tachypneic, + mild diffuse wheezing, no crackles   Abdominal: Soft. Bowel sounds are normal. She exhibits no distension. There is no tenderness. There is no rebound.  Musculoskeletal: Normal range of motion. She exhibits no edema or tenderness.  Neurological: She is alert and oriented to person, place, and time.  Skin: Skin is dry.  Psychiatric: She has a normal mood and affect. Her behavior is normal. Judgment and thought content normal.  Nursing note and vitals reviewed.   ED Course  Procedures (including critical care time) Labs Review Labs Reviewed - No data to display  Imaging Review Dg Chest 2 View  05/06/2015   CLINICAL DATA:  Cough.  EXAM: CHEST  2 VIEW  COMPARISON:  December 23, 2014.  FINDINGS: The heart size and mediastinal contours are within normal limits. Both lungs are clear. Hyperinflation of the lungs is noted. No pneumothorax or pleural effusion is noted. The visualized skeletal structures are unremarkable.  IMPRESSION: Hyperinflation of the lungs suggesting chronic obstructive pulmonary  disease. No acute cardiopulmonary abnormality seen.   Electronically Signed   By: Marijo Conception, M.D.   On: 05/06/2015 12:24     EKG Interpretation None      MDM   Final diagnoses:  None   Margaret Henry is a 64 y.o. female here with cough. Likely COPD exacerbation. Will give nebs and steroids and reassess.   1:00 PM  CXR clear. Minimal wheezing after nebs. Never hypoxic. Will dc home with zpack, prednisone, albuterol for COPD exacerbation.      Wandra Arthurs, MD 05/06/15 1302

## 2015-05-24 ENCOUNTER — Encounter: Payer: Self-pay | Admitting: Family Medicine

## 2015-05-24 ENCOUNTER — Ambulatory Visit: Payer: Medicaid Other | Attending: Family Medicine | Admitting: Family Medicine

## 2015-05-24 VITALS — BP 124/77 | HR 98 | Temp 98.4°F | Ht 64.0 in | Wt 112.0 lb

## 2015-05-24 DIAGNOSIS — E780 Pure hypercholesterolemia: Secondary | ICD-10-CM | POA: Diagnosis not present

## 2015-05-24 DIAGNOSIS — F17210 Nicotine dependence, cigarettes, uncomplicated: Secondary | ICD-10-CM | POA: Insufficient documentation

## 2015-05-24 DIAGNOSIS — Z79899 Other long term (current) drug therapy: Secondary | ICD-10-CM | POA: Diagnosis not present

## 2015-05-24 DIAGNOSIS — Z720 Tobacco use: Secondary | ICD-10-CM

## 2015-05-24 DIAGNOSIS — J449 Chronic obstructive pulmonary disease, unspecified: Secondary | ICD-10-CM | POA: Diagnosis not present

## 2015-05-24 DIAGNOSIS — J441 Chronic obstructive pulmonary disease with (acute) exacerbation: Secondary | ICD-10-CM | POA: Insufficient documentation

## 2015-05-24 DIAGNOSIS — Z7952 Long term (current) use of systemic steroids: Secondary | ICD-10-CM | POA: Diagnosis not present

## 2015-05-24 DIAGNOSIS — F17200 Nicotine dependence, unspecified, uncomplicated: Secondary | ICD-10-CM

## 2015-05-24 MED ORDER — TIOTROPIUM BROMIDE MONOHYDRATE 18 MCG IN CAPS: 18.0000 ug | ORAL_CAPSULE | Freq: Every day | RESPIRATORY_TRACT | Status: DC

## 2015-05-24 MED ORDER — FLUTICASONE-SALMETEROL 100-50 MCG/DOSE IN AEPB: 1.0000 | INHALATION_SPRAY | Freq: Two times a day (BID) | RESPIRATORY_TRACT | Status: DC

## 2015-05-24 NOTE — Progress Notes (Signed)
Patient here to follow up on her COPD flare She has finished her antibiotic and her prednisone She states she feels better and uses her nebulizer 1 time per day if she does not leave her house and 2 times per day if she leaves her house She stopped the Spiriva inhaler because she states it irritated her throat and made her cough She needs a refill on her Advair She states she still smokes cigarettes but went from a pack a day to a pack lasting her all week.

## 2015-05-24 NOTE — Patient Instructions (Signed)

## 2015-05-24 NOTE — Progress Notes (Signed)
Subjective:    Patient ID: Margaret Henry, female    DOB: [birth year], 64 y.o.   MRN: [id number]  HPI Margaret Henry is a 64 year old female with a history of COPD, tobacco abuse who presented to [facility] ED on 05/06/15 with cough productive of sputum and chills and was found to have oxygen saturation of 95% on presentation. Her chest x-ray revealed hyperinflation of the lungs adjusting chronic obstructive pulmonary disease, no acute cardiopulmonary abnormality. She was placed on CPAP, prednisone and albuterol and discharged home after she received nebulizer treatments and IV steroids.   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 States that symptom-wise she is doing well.    Past Medical History  Diagnosis Date  . COPD (chronic obstructive pulmonary disease)   . Bronchitis   . High cholesterol     Past Surgical History  Procedure Laterality Date  . Abdominal hysterectomy     Social History   Social History  . Marital Status: Widowed    Spouse Name: N/A  . Number of Children: N/A  . Years of Education: N/A   Occupational History  . Not on file.   Social History Main Topics  . Smoking status: Light Tobacco Smoker -- 0.00 packs/day    Types: Cigarettes  . Smokeless tobacco: Current User  . Alcohol Use: No  . Drug Use: No  . Sexual Activity: Not on file   Other Topics Concern  . Not on file   Social History Narrative    Allergies  Allergen Reactions  . Hydrocodone Nausea And Vomiting  . Propoxyphene N-Acetaminophen Nausea And Vomiting    Current Outpatient Prescriptions on File Prior to Visit  Medication Sig Dispense Refill  . acetaminophen (TYLENOL) 500 MG tablet Take 1,500 mg by mouth every 4 (four) hours as  needed. For headache    . albuterol (PROVENTIL) (2.5 MG/3ML) 0.083% nebulizer solution Take 3 mLs (2.5 mg total) by nebulization every 6 (six) hours as needed for wheezing or shortness of breath. 75 mL 12  . albuterol (VENTOLIN HFA) 108 (90 BASE) MCG/ACT inhaler Inhale 2 puffs into the lungs every 4 (four) hours as needed for wheezing or shortness of breath. 18 each 3  . azithromycin (ZITHROMAX Z-PAK) 250 MG tablet 2 po day one, then 1 daily x 4 days (Patient not taking: Reported on 05/24/2015) 6 tablet 0  . benzonatate (TESSALON) 100 MG capsule Take 1 capsule (100 mg total) by mouth 3 (three) times daily as needed for cough. (Patient not taking: Reported on 05/24/2015) 30 capsule 1  . diclofenac (CATAFLAM) 50 MG tablet Take 1 tablet (50 mg total) by mouth 3 (three) times daily. As needed for neck pain. Take with food. (Patient not taking: Reported on 01/21/2015) 21 tablet 0  . OVER THE COUNTER MEDICATION Take 1 capsule by mouth daily. Megared omega 3 and krill oil    . predniSONE (DELTASONE) 20 MG tablet Take 60 mg daily x 2 days, 40 mg daily x 2 days then 20 mg daily x 2 days (Patient not taking: Reported on 05/24/2015) 12 tablet 0  . traMADol (ULTRAM) 50 MG tablet Take 1 tablet (50 mg total) by mouth every 6 (six) hours as needed for pain. (Patient not taking: Reported on 01/21/2015)  15 tablet 0  . [DISCONTINUED] simvastatin (ZOCOR) 80 MG tablet Take 80 mg by mouth at bedtime.     No current facility-administered medications on file prior to visit.     Review of Systems  Constitutional: Negative for activity change and appetite change.  HENT: Negative for sinus pressure and sore throat.   Respiratory: Negative for chest tightness, shortness of breath and wheezing.   Gastrointestinal: Negative for abdominal pain, constipation and abdominal distention.  Genitourinary: Negative.   Musculoskeletal: Negative.   Psychiatric/Behavioral: Negative for behavioral problems and dysphoric mood.        Objective: Filed Vitals:   05/24/15 1023  BP: 124/77  Pulse: 98  Temp: 98.4 F (36.9 C)  Height: 5\' 4"  (1.626 m)  Weight: 112 lb (50.803 kg)  SpO2: 95%      Physical Exam  Constitutional: She is oriented to person, place, and time. She appears well-developed and well-nourished.  Cardiovascular: Normal rate, normal heart sounds and intact distal pulses.   No murmur heard. Pulmonary/Chest: Effort normal and breath sounds normal. She has no wheezes. She has no rales. She exhibits no tenderness.  Abdominal: Soft. Bowel sounds are normal. She exhibits no distension and no mass. There is no tenderness.  Musculoskeletal: Normal range of motion.  Neurological: She is alert and oriented to person, place, and time.          Assessment & Plan:  64 year old female with a history of COPD recently managed for COPD exacerbation who currently feels fine.  COPD exacerbation: Resolved. Continue Advair and Proventil as prescribed.  Tobacco abuse: Currently using nicotine patches and is working on quitting. Spent 3 minutes counseling on cessation.

## 2015-06-24 ENCOUNTER — Encounter: Payer: Self-pay | Admitting: Family Medicine

## 2015-06-24 ENCOUNTER — Ambulatory Visit: Payer: Medicaid Other | Attending: Family Medicine | Admitting: Family Medicine

## 2015-06-24 ENCOUNTER — Encounter: Payer: Self-pay | Admitting: Internal Medicine

## 2015-06-24 VITALS — BP 108/64 | HR 84 | Temp 97.7°F | Resp 16 | Ht 64.0 in | Wt 113.0 lb

## 2015-06-24 DIAGNOSIS — K219 Gastro-esophageal reflux disease without esophagitis: Secondary | ICD-10-CM | POA: Insufficient documentation

## 2015-06-24 DIAGNOSIS — R1012 Left upper quadrant pain: Secondary | ICD-10-CM | POA: Insufficient documentation

## 2015-06-24 DIAGNOSIS — Z23 Encounter for immunization: Secondary | ICD-10-CM

## 2015-06-24 DIAGNOSIS — Z720 Tobacco use: Secondary | ICD-10-CM | POA: Insufficient documentation

## 2015-06-24 DIAGNOSIS — J42 Unspecified chronic bronchitis: Secondary | ICD-10-CM | POA: Diagnosis not present

## 2015-06-24 DIAGNOSIS — Z0000 Encounter for general adult medical examination without abnormal findings: Secondary | ICD-10-CM | POA: Diagnosis not present

## 2015-06-24 DIAGNOSIS — F17200 Nicotine dependence, unspecified, uncomplicated: Secondary | ICD-10-CM

## 2015-06-24 MED ORDER — RANITIDINE HCL 150 MG PO TABS: 150.0000 mg | ORAL_TABLET | Freq: Every day | ORAL | Status: DC

## 2015-06-24 NOTE — Progress Notes (Signed)
Patient ID: Margaret Henry, female   DOB: [birth year], 64 y.o.   MRN: [id number]   Subjective:  Patient ID: Margaret Henry, female    DOB: 1950/12/10  Age: 64 y.o. MRN: [id number]  CC: Establish Care and COPD   HPI Margaret Henry presents for COPD  1. COPD: smoking. Working to quit. Quit date is 07/08/15. Using albuterol 1-2 times per day. 2 times per day if she goes out. Using spiriva. Has SOB after walking for > 1 block in the heat. Has cough. No CP or SOB at rest.   2. Early satiety; x 2-3 months. No abdominal pain. Some bloating. No nausea or emesis. Has not had screening colonoscopy. No change in stools. She has GERD. She notices increased reflux in AM. She takes tums.   Outpatient Prescriptions Prior to Visit  Medication Sig Dispense Refill  . acetaminophen (TYLENOL) 500 MG tablet Take 1,500 mg by mouth every 4 (four) hours as needed. For headache    . albuterol (PROVENTIL) (2.5 MG/3ML) 0.083% nebulizer solution Take 3 mLs (2.5 mg total) by nebulization every 6 (six) hours as needed for wheezing or shortness of breath. 75 mL 12  . albuterol (VENTOLIN HFA) 108 (90 BASE) MCG/ACT inhaler Inhale 2 puffs into the lungs every 4 (four) hours as needed for wheezing or shortness of breath. 18 each 3  . Fluticasone-Salmeterol (ADVAIR) 100-50 MCG/DOSE AEPB Inhale 1 puff into the lungs 2 (two) times daily. 1 each 5  . OVER THE COUNTER MEDICATION Take 1 capsule by mouth daily. Megared omega 3 and krill oil    . tiotropium (SPIRIVA HANDIHALER) 18 MCG inhalation capsule Place 1 capsule (18 mcg total) into inhaler and inhale daily. 30 capsule 2  . azithromycin (ZITHROMAX Z-PAK) 250 MG tablet 2 po day one, then 1 daily x 4 days (Patient not taking: Reported on 05/24/2015) 6 tablet 0  . benzonatate (TESSALON) 100 MG capsule Take 1 capsule (100 mg total) by mouth 3 (three) times daily as needed for cough. (Patient not taking: Reported on 05/24/2015) 30 capsule 1  . diclofenac (CATAFLAM) 50 MG tablet Take 1  tablet (50 mg total) by mouth 3 (three) times daily. As needed for neck pain. Take with food. (Patient not taking: Reported on 01/21/2015) 21 tablet 0  . predniSONE (DELTASONE) 20 MG tablet Take 60 mg daily x 2 days, 40 mg daily x 2 days then 20 mg daily x 2 days (Patient not taking: Reported on 05/24/2015) 12 tablet 0  . traMADol (ULTRAM) 50 MG tablet Take 1 tablet (50 mg total) by mouth every 6 (six) hours as needed for pain. (Patient not taking: Reported on 01/21/2015) 15 tablet 0   No facility-administered medications prior to visit.   Social History  Substance Use Topics  . Smoking status: Heavy Tobacco Smoker -- 1.00 packs/day    Types: Cigarettes  . Smokeless tobacco: Current User  . Alcohol Use: No   ROS Review of Systems  Constitutional: Negative for fever and chills.  Eyes: Negative for visual disturbance.  Respiratory: Positive for cough and shortness of breath.   Cardiovascular: Negative for chest pain.  Gastrointestinal: Negative for nausea, vomiting, abdominal pain, diarrhea, constipation and blood in stool.  Musculoskeletal: Negative for back pain and arthralgias.  Skin: Negative for rash.  Allergic/Immunologic: Negative for immunocompromised state.  Hematological: Negative for adenopathy. Does not bruise/bleed easily.  Psychiatric/Behavioral: Negative for suicidal ideas and dysphoric mood.    Objective:  BP 108/64 mmHg  Pulse 84  Temp(Src) 97.7 F (36.5 C) (Oral)  Resp 16  Ht 5\' 4"  (1.626 m)  Wt 113 lb (51.256 kg)  BMI 19.39 kg/m2  SpO2 96%  BP/Weight 06/24/2015 05/24/2015 8/34/1962  Systolic BP 229 798 921  Diastolic BP 64 77 68  Wt. (Lbs) 113 112 111  BMI 19.39 19.22 19.04    Physical Exam  Constitutional: She is oriented to person, place, and time. She appears well-developed and well-nourished. No distress.  Cardiovascular: Normal rate, regular rhythm, normal heart sounds and intact distal pulses.   Pulmonary/Chest: Effort normal and breath sounds normal.   Abdominal: Soft. Bowel sounds are normal. She exhibits no distension and no mass. There is no tenderness. There is no rebound and no guarding.  Musculoskeletal: She exhibits no edema.  Neurological: She is alert and oriented to person, place, and time.  Skin: Skin is warm and dry. No rash noted.  Psychiatric: She has a normal mood and affect.     Assessment & Plan:   Problem List Items Addressed This Visit    COPD (chronic obstructive pulmonary disease) (Chronic)   Relevant Orders   Ambulatory referral to Pulmonology   GERD   Relevant Medications   ranitidine (ZANTAC) 150 MG tablet   RESOLVED: LUQ PAIN - Primary    Other Visit Diagnoses    Healthcare maintenance        Relevant Orders    Ambulatory referral to Gastroenterology       No orders of the defined types were placed in this encounter.    Follow-up: No Follow-up on file.   Boykin Nearing MD

## 2015-06-24 NOTE — Progress Notes (Signed)
Establish Care with new PCD F/U COPD  Tobacco per day 1 ppweek

## 2015-06-24 NOTE — Assessment & Plan Note (Signed)
A; early satiety in setting of GERD P: Zantac trial  GI referral for screening colonoscopy

## 2015-06-24 NOTE — Assessment & Plan Note (Signed)
A; has COPD with DOE. Working towards smoking cessation  Med: compliant P: pulm referral for PFTs Forms for handicap placcard provided

## 2015-06-24 NOTE — Patient Instructions (Addendum)
Margaret Henry  F/u with me in 3-4 weeks for full physical with breast exam and EKG  Margaret Henry was seen today for establish care and copd.  Diagnoses and all orders for this visit:  LUQ PAIN  Gastroesophageal reflux disease, esophagitis presence not specified -     ranitidine (ZANTAC) 150 MG tablet; Take 1 tablet (150 mg total) by mouth at bedtime.  Healthcare maintenance -     Ambulatory referral to Gastroenterology  Chronic bronchitis, unspecified chronic bronchitis type -     Ambulatory referral to Pulmonology  TOBACCO ABUSE  Other orders -     Flu Vaccine QUAD 36+ mos IM   Dr. Adrian Blackwater

## 2015-06-27 ENCOUNTER — Other Ambulatory Visit: Payer: Self-pay | Admitting: Internal Medicine

## 2015-07-20 ENCOUNTER — Other Ambulatory Visit: Payer: Self-pay | Admitting: Pulmonary Disease

## 2015-07-20 DIAGNOSIS — R0600 Dyspnea, unspecified: Secondary | ICD-10-CM

## 2015-07-21 ENCOUNTER — Ambulatory Visit (INDEPENDENT_AMBULATORY_CARE_PROVIDER_SITE_OTHER): Payer: Commercial Managed Care - HMO | Admitting: Pulmonary Disease

## 2015-07-21 ENCOUNTER — Other Ambulatory Visit: Payer: Self-pay | Admitting: Acute Care

## 2015-07-21 ENCOUNTER — Telehealth: Payer: Self-pay | Admitting: Pulmonary Disease

## 2015-07-21 ENCOUNTER — Encounter: Payer: Self-pay | Admitting: Pulmonary Disease

## 2015-07-21 ENCOUNTER — Institutional Professional Consult (permissible substitution): Payer: Medicaid Other | Admitting: Pulmonary Disease

## 2015-07-21 VITALS — BP 130/70 | HR 80 | Temp 97.9°F | Ht 64.0 in | Wt 113.4 lb

## 2015-07-21 DIAGNOSIS — J439 Emphysema, unspecified: Secondary | ICD-10-CM

## 2015-07-21 DIAGNOSIS — F17210 Nicotine dependence, cigarettes, uncomplicated: Secondary | ICD-10-CM

## 2015-07-21 DIAGNOSIS — R0600 Dyspnea, unspecified: Secondary | ICD-10-CM

## 2015-07-21 DIAGNOSIS — Z87891 Personal history of nicotine dependence: Secondary | ICD-10-CM | POA: Diagnosis not present

## 2015-07-21 DIAGNOSIS — J42 Unspecified chronic bronchitis: Secondary | ICD-10-CM

## 2015-07-21 DIAGNOSIS — Z122 Encounter for screening for malignant neoplasm of respiratory organs: Secondary | ICD-10-CM

## 2015-07-21 LAB — PULMONARY FUNCTION TEST
DL/VA % pred: 39 %
DL/VA: 1.87 ml/min/mmHg/L
DLCO UNC % PRED: 36 %
DLCO unc: 8.56 ml/min/mmHg
FEF 25-75 POST: 0.52 L/s
FEF 25-75 Pre: 0.37 L/sec
FEF2575-%Change-Post: 40 %
FEF2575-%PRED-POST: 24 %
FEF2575-%Pred-Pre: 17 %
FEV1-%CHANGE-POST: 11 %
FEV1-%PRED-PRE: 39 %
FEV1-%Pred-Post: 43 %
FEV1-POST: 1.03 L
FEV1-Pre: 0.93 L
FEV1FVC-%Change-Post: 2 %
FEV1FVC-%PRED-PRE: 58 %
FEV6-%Change-Post: 9 %
FEV6-%PRED-POST: 72 %
FEV6-%Pred-Pre: 66 %
FEV6-POST: 2.16 L
FEV6-PRE: 1.97 L
FEV6FVC-%Change-Post: 0 %
FEV6FVC-%PRED-POST: 100 %
FEV6FVC-%Pred-Pre: 99 %
FVC-%Change-Post: 8 %
FVC-%PRED-PRE: 66 %
FVC-%Pred-Post: 72 %
FVC-POST: 2.23 L
FVC-Pre: 2.06 L
POST FEV1/FVC RATIO: 46 %
Post FEV6/FVC ratio: 96 %
Pre FEV1/FVC ratio: 45 %
Pre FEV6/FVC Ratio: 96 %

## 2015-07-21 MED ORDER — FLUTICASONE-SALMETEROL 250-50 MCG/DOSE IN AEPB
1.0000 | INHALATION_SPRAY | Freq: Two times a day (BID) | RESPIRATORY_TRACT | Status: DC
Start: 2015-07-21 — End: 2015-10-14

## 2015-07-21 NOTE — Progress Notes (Signed)
PFT done today. 

## 2015-07-21 NOTE — Patient Instructions (Addendum)
We will increase the advair dose We will refer you for lung cancer screening.  Return in 6 months

## 2015-07-21 NOTE — Telephone Encounter (Signed)
Spoke with Jeani Hawking at [facility]  She received our order for battery operated neb machine, but they do not provide those  Would you like to send order to a different DME? Please advise thanks

## 2015-07-21 NOTE — Progress Notes (Addendum)
Subjective:    Patient ID: Margaret Henry, female    DOB: Sep 17, 1951, 64 y.o.   MRN: [id number]  HPI Referral for evaluation of COPD.  [name] is a 64 year old with history of COPD diagnosed 3 years ago. Her symptoms consist mainly of shortness of breath, daily cough with whitish sputum production, wheeze, dyspnea and exertion. She does not have any hemoptysis, chest pain, palpitations. No fevers, chills.  She is currently maintained on Spiriva and Advair. She states that this helps with her symptoms. She had temporarily been on Spiriva for a couple of months earlier this year because she could not afford it. At that time her cough increases. She has since restarted Spiriva with improvement in symptoms. She uses her albuterol rescue inhaler about 3-4 times a day.  She is a current smoker. She had smoked one pack per day for nearly 25 years. She is trying to quit and is down to 1 pack every week. She had used the nicotine patch in the past. 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 She does not recall any particular exposures at work or at home.  Patient Active Problem List   Diagnosis Date Noted  . Unspecified vitamin D deficiency 03/02/2010  . ALLERGIC RHINITIS 03/02/2010  . BACK PAIN, LUMBAR 03/02/2010  . TROCHANTERIC BURSITIS, BILATERAL 03/02/2010  . THYROID NODULE, RIGHT 11/09/2009  . DEPRESSION 11/09/2009  . HYPERLIPIDEMIA 02/25/2009  . GERD 02/25/2009  . OSTEOPOROSIS 01/30/2009  . COMPRESSION FRACTURE, SPINE 01/21/2009  . TOBACCO ABUSE 09/07/2008  . COPD (chronic obstructive pulmonary disease) ([place]) 09/07/2008  . Goiter, unspecified 10/08/1997    Current outpatient prescriptions:  .  acetaminophen (TYLENOL) 500 MG tablet, Take 1,500 mg by mouth every 4 (four) hours as needed. For headache, Disp: , Rfl:  .  albuterol (PROVENTIL) (2.5 MG/3ML) 0.083% nebulizer solution, Take 3 mLs (2.5 mg total) by nebulization every 6 (six) hours as needed for  wheezing or shortness of breath., Disp: 75 mL, Rfl: 12 .  albuterol (VENTOLIN HFA) 108 (90 BASE) MCG/ACT inhaler, Inhale 2 puffs into the lungs every 4 (four) hours as needed for wheezing or shortness of breath., Disp: 18 each, Rfl: 3 .  aspirin 81 MG tablet, Take 81 mg by mouth daily., Disp: , Rfl:  .  ranitidine (ZANTAC) 150 MG tablet, Take 1 tablet (150 mg total) by mouth at bedtime., Disp: 30 tablet, Rfl: 0 .  tiotropium (SPIRIVA HANDIHALER) 18 MCG inhalation capsule, Place 1 capsule (18 mcg total) into inhaler and inhale daily., Disp: 30 capsule, Rfl: 2 .  Fluticasone-Salmeterol (ADVAIR DISKUS) 250-50 MCG/DOSE AEPB, Inhale 1 puff into the lungs 2 (two) times daily., Disp: 60 each, Rfl: 5 .  [DISCONTINUED] simvastatin (ZOCOR) 80 MG tablet, Take 80 mg by mouth at bedtime., Disp: , Rfl:    DATA: CXR (05/06/15) Hyperinflation of the lungs suggesting chronic obstructive pulmonary disease. No acute cardiopulmonary abnormality seen.  PFTs (07/21/15) FVC 2.06 66%) FEV1 0.93 (39%) F/F 45 DLCO 36% Unable complete pleth.   Review of Systems Complains of daily cough, productive with white sputum, dyspnea on exertion and at rest, wheezing. No chest pain, palpitations.  No nausea, vomiting, diarrhea. All other review of systems negative.    Objective:   Physical Exam  Blood pressure 130/70, pulse 80, temperature 97.9 F (36.6 C), temperature source Oral, height 5\' 4"  (1.626 m), weight 113 lb 6.4 oz (51.438 kg), SpO2 97 %. Gen.: Pleasant female. No apparent distress Neuro: No  gross focal deficits. Neck: No JVD, lymphadenopathy, thyromegaly. RS: Clear, No wheeze or crackles. CVS: S1-S2 heard, no murmurs rubs gallops. Abdomen: Soft, positive bowel sounds. Extremities: No edema    Assessment & Plan:  #1 Severe COPD,Gold Stage III, Chronic bronchitis She seems to be doing okay on the current inhalers but still has some dyspnea on exertion. PFTs today were reviewed. This confirmed severe  COPD. She does not meet the criteria for bronchodilator response but she does have improvement in flow rates indicating that she might benefit from up titration of her inhalers. I will increase her Advair dose to 250/50. She is also inquiring about a battery operated nebulizer.  #2 Current smoker. Smoking cessation was discussed with her. She wants to try to quit on her using the nicotine patch. She's already been successful cutting down considerably on the number of cigarettes. We will reassess at her return visit. If she continues to smoke and we can consider using Wellbutrin or Chantix. She meets the criteria for screening CTs of the chest.  Plan: - Increase advair dose to 250/50, continue spiriva - Smoking cessation. - Referral for lung cancer screening.   Marshell Garfinkel MD [facility] Pager [phone] If no answer or after 3pm call: [phone] 07/21/2015, 11:35 AM

## 2015-07-28 NOTE — Telephone Encounter (Signed)
Yes. Do we have a list of alternate DME providers that we can try?

## 2015-07-28 NOTE — Telephone Encounter (Signed)
Dr. Mannam - please advise. Thanks. 

## 2015-07-28 NOTE — Telephone Encounter (Signed)
[facility] do you know who provides these? thanks

## 2015-07-29 NOTE — Telephone Encounter (Signed)
[facility] - can we send this order to another DME company?  Or do we need to re-enter order for the battery operated nebulizer?  Please advise.

## 2015-08-01 NOTE — Telephone Encounter (Signed)
Sent referral to [facility] to see if they have a battery operated neb [place]

## 2015-08-01 NOTE — Telephone Encounter (Signed)
[facility] will be working on this Software engineer for pt Margaret Henry

## 2015-08-02 DIAGNOSIS — J449 Chronic obstructive pulmonary disease, unspecified: Secondary | ICD-10-CM | POA: Diagnosis not present

## 2015-08-08 ENCOUNTER — Encounter: Payer: Self-pay | Admitting: Internal Medicine

## 2015-08-25 ENCOUNTER — Ambulatory Visit (AMBULATORY_SURGERY_CENTER): Payer: Self-pay | Admitting: *Deleted

## 2015-08-25 VITALS — Ht 64.0 in | Wt 112.8 lb

## 2015-08-25 DIAGNOSIS — Z1211 Encounter for screening for malignant neoplasm of colon: Secondary | ICD-10-CM

## 2015-08-25 MED ORDER — NA SULFATE-K SULFATE-MG SULF 17.5-3.13-1.6 GM/177ML PO SOLN: 1.0000 | Freq: Once | ORAL | Status: DC

## 2015-08-25 NOTE — Progress Notes (Signed)
No egg or soy allergy No issues with past sedation-was hard to wake post op with deviated septum surgery No diet pills No home 02 use  Pt has copd and is concerned about sedation wit this procedure  emmi declined

## 2015-08-29 ENCOUNTER — Encounter: Payer: Self-pay | Admitting: Acute Care

## 2015-08-29 ENCOUNTER — Ambulatory Visit (INDEPENDENT_AMBULATORY_CARE_PROVIDER_SITE_OTHER)
Admission: RE | Admit: 2015-08-29 | Discharge: 2015-08-29 | Disposition: A | Discharge status: Home / Self Care | Payer: Commercial Managed Care - HMO | Source: Ambulatory Visit | Attending: Acute Care | Admitting: Acute Care

## 2015-08-29 ENCOUNTER — Ambulatory Visit (INDEPENDENT_AMBULATORY_CARE_PROVIDER_SITE_OTHER): Payer: Commercial Managed Care - HMO | Admitting: Acute Care

## 2015-08-29 DIAGNOSIS — F17210 Nicotine dependence, cigarettes, uncomplicated: Secondary | ICD-10-CM | POA: Diagnosis not present

## 2015-08-29 NOTE — Progress Notes (Signed)
Shared Decision Making Visit Lung Cancer Screening Program [id number])   Eligibility:  Age 64 y.o.  Pack Years Smoking History Calculation : 49 pack year smoking history (# packs/per year x # years smoked)  Recent History of coughing up blood  no  Unexplained weight loss? no ( >Than 15 pounds within the last 6 months )  Prior History Lung / other cancer no (Diagnosis within the last 5 years already requiring surveillance chest CT Scans).  Smoking Status Current Smoker Former Smokers: Years since quit:NA; current smoker Quit Date: NA  Visit Components:  Discussion included one or more decision making aids. yes  Discussion included risk/benefits of screening. yes  Discussion included potential follow up diagnostic testing for abnormal scans. yes  Discussion included meaning and risk of over diagnosis. yes  Discussion included meaning and risk of False Positives. yes  Discussion included meaning of total radiation exposure. yes  Counseling Included:  Importance of adherence to annual lung cancer LDCT screening. yes  Impact of comorbidities on ability to participate in the program. yes  Ability and willingness to under diagnostic treatment. yes  Smoking Cessation Counseling:  Current Smokers:   Discussed importance of smoking cessation. yes  Information about tobacco cessation classes and interventions provided to patient. yes  Patient provided with "ticket" for LDCT Scan. yes  Symptomatic Patient. No  Counseling: NA  Diagnosis Code: Tobacco Use Z72.0  Asymptomatic Patient yes  Counseling (Intermediate counseling: > three minutes counseling) [id number]  Former Smokers:   Discussed the importance of maintaining cigarette abstinence. Current Smoker  Diagnosis Code: Personal History of Nicotine Dependence. [id number]  Information about tobacco cessation classes and interventions provided to patient. Yes  Patient provided with "ticket" for LDCT Scan. yes  Written  Order for Lung Cancer Screening with LDCT placed in Epic. Yes (CT Chest Lung Cancer Screening Low Dose W/O CM) [id number] Z12.2-Screening of respiratory organs Z87.891-Personal history of nicotine dependence  I spent 15 minutes of face to face time discussing the risks and benefits of lung cancer screening with Margaret Henry. We viewed a power point together  that explained the above noted topics in great detail. We took the time to pause at intervals to allow for questions to be asked and answered to ensure understanding. We discussed that the single most powerful action that she can take to decrease her risk of lung cancer is to quit smoking. She is trying at present to quit. She wants to try on her own before trying medication. She is using lollipops to help with the oral trigger. I have given her the " Be stronger than your excuses card" with resources in the community that can help her to meet her goal of becoming smoke free. I have also told her to call me so that I can help her if she is not successful quitting on her own. She has COPD and knows she needs to quit for many reasons. We discussed the time and location of her scan, and that either June Leap CMA or myself will call her with the results of the scan within 24-48 hours of receiving the results ourselves.She has my contact information and a copy of the power point to refer to in the event she has any questions. She verbalized understanding of all of the above, and had no further questions upon leaving the office.   Magdalen Spatz, NP

## 2015-09-08 ENCOUNTER — Encounter: Payer: Medicaid Other | Admitting: Internal Medicine

## 2015-09-09 ENCOUNTER — Telehealth: Payer: Self-pay | Admitting: Family Medicine

## 2015-09-09 NOTE — Telephone Encounter (Signed)
-----   Message from Magdalen Spatz, NP sent at 08/30/2015  9:28 AM EST ----- I have called the patient and given her the results of the scan. She verbalized understanding of the results, and had no further questions. I explained to her that her scan showed atherosclerosis, and a nodule on her thyroid. She stated that she has had a thyroid biopsy in the past, and that it is time for her to have her cholesterol checked. I have messaged these results to Dr. Adrian Blackwater, the patient's PCP for follow up as she feels is appropriate.

## 2015-09-09 NOTE — Telephone Encounter (Signed)
Please call patient for f/u cholesterol check and to discuss thyroid nodule

## 2015-09-14 NOTE — Telephone Encounter (Signed)
Pt notified fasting Cholesterol check on 09/26/2015 at 9:00  And at 0930 with PCP

## 2015-09-26 ENCOUNTER — Encounter: Payer: Self-pay | Admitting: Family Medicine

## 2015-09-26 ENCOUNTER — Ambulatory Visit: Payer: Commercial Managed Care - HMO

## 2015-09-26 ENCOUNTER — Ambulatory Visit: Payer: Commercial Managed Care - HMO | Attending: Family Medicine | Admitting: Family Medicine

## 2015-09-26 VITALS — BP 129/68 | HR 89 | Temp 98.1°F | Resp 16 | Ht 64.0 in | Wt 112.0 lb

## 2015-09-26 DIAGNOSIS — Z1159 Encounter for screening for other viral diseases: Secondary | ICD-10-CM | POA: Diagnosis not present

## 2015-09-26 DIAGNOSIS — K219 Gastro-esophageal reflux disease without esophagitis: Secondary | ICD-10-CM | POA: Diagnosis not present

## 2015-09-26 DIAGNOSIS — I2510 Atherosclerotic heart disease of native coronary artery without angina pectoris: Secondary | ICD-10-CM

## 2015-09-26 DIAGNOSIS — Z79899 Other long term (current) drug therapy: Secondary | ICD-10-CM | POA: Insufficient documentation

## 2015-09-26 DIAGNOSIS — F17200 Nicotine dependence, unspecified, uncomplicated: Secondary | ICD-10-CM | POA: Diagnosis not present

## 2015-09-26 DIAGNOSIS — Z7982 Long term (current) use of aspirin: Secondary | ICD-10-CM | POA: Insufficient documentation

## 2015-09-26 DIAGNOSIS — Z0000 Encounter for general adult medical examination without abnormal findings: Secondary | ICD-10-CM | POA: Diagnosis not present

## 2015-09-26 DIAGNOSIS — E785 Hyperlipidemia, unspecified: Secondary | ICD-10-CM

## 2015-09-26 DIAGNOSIS — E041 Nontoxic single thyroid nodule: Secondary | ICD-10-CM

## 2015-09-26 DIAGNOSIS — Z23 Encounter for immunization: Secondary | ICD-10-CM

## 2015-09-26 DIAGNOSIS — I252 Old myocardial infarction: Secondary | ICD-10-CM | POA: Diagnosis not present

## 2015-09-26 DIAGNOSIS — F17210 Nicotine dependence, cigarettes, uncomplicated: Secondary | ICD-10-CM | POA: Diagnosis not present

## 2015-09-26 DIAGNOSIS — Z114 Encounter for screening for human immunodeficiency virus [HIV]: Secondary | ICD-10-CM

## 2015-09-26 LAB — LDL CHOLESTEROL, DIRECT: LDL DIRECT: 166 mg/dL — AB (ref <130)

## 2015-09-26 LAB — POCT GLYCOSYLATED HEMOGLOBIN (HGB A1C): Hemoglobin A1C: 5.4

## 2015-09-26 LAB — HEPATITIS C ANTIBODY: HCV Ab: NEGATIVE

## 2015-09-26 LAB — HIV ANTIBODY (ROUTINE TESTING W REFLEX): HIV 1&2 Ab, 4th Generation: NONREACTIVE

## 2015-09-26 MED ORDER — VARENICLINE TARTRATE 1 MG PO TABS: 1.0000 mg | ORAL_TABLET | Freq: Two times a day (BID) | ORAL | Status: DC

## 2015-09-26 MED ORDER — VARENICLINE TARTRATE 0.5 MG X 11 & 1 MG X 42 PO MISC: ORAL | Status: DC

## 2015-09-26 MED ORDER — RANITIDINE HCL 150 MG PO TABS: 150.0000 mg | ORAL_TABLET | Freq: Every day | ORAL | Status: DC

## 2015-09-26 MED ORDER — ATORVASTATIN CALCIUM 40 MG PO TABS: 40.0000 mg | ORAL_TABLET | Freq: Every day | ORAL | Status: DC

## 2015-09-26 MED ORDER — RANITIDINE HCL 150 MG PO TABS
150.0000 mg | ORAL_TABLET | Freq: Every day | ORAL | Status: DC
Start: 2015-09-26 — End: 2015-09-26

## 2015-09-26 NOTE — Progress Notes (Signed)
Subjective:  Patient ID: Margaret Henry, female    DOB: Mar 06, 1951  Age: 64 y.o. MRN: [id number]  CC: Thyroid Nodule and Hyperlipidemia   HPI Margaret Henry presents for    1. Thyroid nodule: patient here to f/u known hx of R thyroid nodule per request of her pulmonologist. She has a CT chest lung cancer screen done last month that commented on the R thyroid nodule as well as multi vessel coronary artery atherosclerosis. She was first evaluated for this nodule with [place] followed by biopsy in 2011, results are below. She has had normal TSH as recently as 04/2015. She   Thyroid US 11/11/2009 Findings: The right thyroid lobe measures 6.5 cm in length and 1.6 x 1.4 cm transversely. Left lobe measures 4.6 cm in length and 1.1 x 1.3 cm transversely. The isthmus measures 0.2 cm in thickness.  There is a 1.5 x 0.8 x 0.9 cm solid nodule in the mid aspect of the right lobe. There is a dominant nodule in the right lower pole which has a complex appearance and contains calcifications. It measures 2.9 x 1.8 x 1.8 cm.  Thyroid biopsy 12/16/2009 INTERPRETATION(S): THYROID, FINE NEEDLE ASPIRATION, RIGHT MID MASS Findings consistent with non-neoplastic goiter.  CT chest lung CA screen 08/29/2015 Partially calcified right-sided thyroid nodule measures 1.7 cm (image 4, series 2).  2. Coronary artery atherosclerosis: patient has known HLD. She is a smoker. She takes daily ASA she is not on a statin. She has know hx of MI. Her 10 yr CVD risk is 11.5%.   Social History  Substance Use Topics  . Smoking status: Heavy Tobacco Smoker -- 1.00 packs/day for 49 years    Types: Cigarettes  . Smokeless tobacco: Former Systems developer     Comment: smokes 5 cigs a day -not using vapors / trying to quit on her own for 6 months, then will use meds if unable to quit on her own.  . Alcohol Use: No    Outpatient Prescriptions Prior to Visit  Medication Sig Dispense Refill  . acetaminophen (TYLENOL) 500 MG tablet  Take 1,500 mg by mouth every 4 (four) hours as needed. For headache    . albuterol (PROVENTIL) (2.5 MG/3ML) 0.083% nebulizer solution Take 3 mLs (2.5 mg total) by nebulization every 6 (six) hours as needed for wheezing or shortness of breath. 75 mL 12  . albuterol (VENTOLIN HFA) 108 (90 BASE) MCG/ACT inhaler Inhale 2 puffs into the lungs every 4 (four) hours as needed for wheezing or shortness of breath. 18 each 3  . aspirin 81 MG tablet Take 81 mg by mouth daily.    . Fluticasone-Salmeterol (ADVAIR DISKUS) 250-50 MCG/DOSE AEPB Inhale 1 puff into the lungs 2 (two) times daily. 60 each 5  . Na Sulfate-K Sulfate-Mg Sulf (SUPREP BOWEL PREP) SOLN Take 1 kit by mouth once. suprep as directed. No substitutions 354 mL 0  . Oxymetazoline HCl (SINEX LONG-ACTING NA) Place 2 sprays into the nose daily.    . ranitidine (ZANTAC) 150 MG tablet Take 1 tablet (150 mg total) by mouth at bedtime. (Patient not taking: Reported on 08/25/2015) 30 tablet 0  . tiotropium (SPIRIVA HANDIHALER) 18 MCG inhalation capsule Place 1 capsule (18 mcg total) into inhaler and inhale daily. 30 capsule 2  . VENTOLIN HFA 108 (90 BASE) MCG/ACT inhaler INHALE 2 PUFFS INTO THE LUNGS EVERY 6 HOURS AS NEEDED FOR WHEEZING OR SHORTNESS OF BREATH. 18 g 2   No facility-administered medications prior to visit.  ROS Review of Systems  Constitutional: Negative for fever and chills.  Eyes: Negative for visual disturbance.  Respiratory: Positive for cough and shortness of breath.   Cardiovascular: Negative for chest pain.  Gastrointestinal: Negative for nausea, vomiting, abdominal pain, diarrhea, constipation and blood in stool.  Endocrine: Positive for cold intolerance.  Musculoskeletal: Negative for back pain and arthralgias.  Skin: Negative for rash.  Allergic/Immunologic: Negative for immunocompromised state.  Hematological: Negative for adenopathy. Does not bruise/bleed easily.  Psychiatric/Behavioral: Negative for suicidal ideas and  dysphoric mood.    Objective:  BP 129/68 mmHg  Pulse 89  Temp(Src) 98.1 F (36.7 C) (Oral)  Resp 16  Ht 5' 4"  (1.626 m)  Wt 112 lb (50.803 kg)  BMI 19.22 kg/m2  SpO2 95%  BP/Weight 09/26/2015 08/25/2015 24/82/5003  Systolic BP 704 - 888  Diastolic BP 68 - 70  Wt. (Lbs) 112 112.8 113.4  BMI 19.22 19.35 19.46   Physical Exam  Constitutional: She is oriented to person, place, and time. She appears well-developed and well-nourished. No distress.  HENT:  Head: Normocephalic and atraumatic.  Cardiovascular: Normal rate, regular rhythm, normal heart sounds and intact distal pulses.   Pulmonary/Chest: Effort normal and breath sounds normal.  Musculoskeletal: She exhibits no edema.  Neurological: She is alert and oriented to person, place, and time.  Skin: Skin is warm and dry. No rash noted.  Psychiatric: She has a normal mood and affect.   Lab Results  Component Value Date   HGBA1C 5.40 09/26/2015    Assessment & Plan:   Problem List Items Addressed This Visit    Coronary arteriosclerosis (Chronic)    A; atherosclerosis in smoker with HLD P: lipitor chantix for smoking cessation        Relevant Medications   atorvastatin (LIPITOR) 40 MG tablet   Other Relevant Orders   HgB A1c (Completed)   GERD   Relevant Medications   ranitidine (ZANTAC) 150 MG tablet   Hyperlipidemia - Primary (Chronic)   Relevant Medications   atorvastatin (LIPITOR) 40 MG tablet   Other Relevant Orders   LDL Cholesterol, Direct   THYROID NODULE, RIGHT (Chronic)    Stable thyroid goiter has been biopsied in the past. Negative biopsy. Not enlarging. TSH has been normal.   Plan for monitoring with exams and yearly TSH       TOBACCO ABUSE (Chronic)   Relevant Medications   varenicline (CHANTIX STARTING MONTH PAK) 0.5 MG X 11 & 1 MG X 42 tablet   varenicline (CHANTIX CONTINUING MONTH PAK) 1 MG tablet    Other Visit Diagnoses    Screening for HIV (human immunodeficiency virus)         Relevant Orders    HIV antibody (with reflex)    Need for hepatitis C screening test        Relevant Orders    Hepatitis C antibody, reflex    Healthcare maintenance        Relevant Orders    MM DIGITAL SCREENING BILATERAL       No orders of the defined types were placed in this encounter.    Follow-up: No Follow-up on file.   Boykin Nearing MD

## 2015-09-26 NOTE — Patient Instructions (Addendum)
Margaret Henry was seen today for thyroid nodule and hyperlipidemia.  Diagnoses and all orders for this visit:  Hyperlipidemia -     LDL Cholesterol, Direct -     atorvastatin (LIPITOR) 40 MG tablet; Take 1 tablet (40 mg total) by mouth daily.  Coronary arteriosclerosis -     HgB A1c -     atorvastatin (LIPITOR) 40 MG tablet; Take 1 tablet (40 mg total) by mouth daily.  Gastroesophageal reflux disease, esophagitis presence not specified -     Discontinue: ranitidine (ZANTAC) 150 MG tablet; Take 1 tablet (150 mg total) by mouth at bedtime. (Patient not taking: Reported on 09/26/2015) -     ranitidine (ZANTAC) 150 MG tablet; Take 1 tablet (150 mg total) by mouth at bedtime.  TOBACCO ABUSE -     varenicline (CHANTIX STARTING MONTH PAK) 0.5 MG X 11 & 1 MG X 42 tablet; Taper per packet insert   Start chantix: Take one 0.5 mg tablet by mouth once daily for 3 days, then increase to one 0.5 mg tablet twice daily for 4 days, then increase to one 1 mg tablet twice daily. Set quit date for 8-35 days after starting chantix. Next month continue with the continuation pack.   Smoking cessation support: smoking cessation hotline: 1-800-QUIT-NOW.  Smoking cessation classes are available through [facility]. Call ([phone] or visit our website at [email or web address]   F/u in 6 weeks for smoking cessation   Dr. Adrian Blackwater

## 2015-09-26 NOTE — Progress Notes (Signed)
F/U Results and cholesterol check  No pain today  Tobacco user 5 cigarette per day  No suicidal thoughts in the past two weeks

## 2015-09-26 NOTE — Assessment & Plan Note (Signed)
A; atherosclerosis in smoker with HLD P: lipitor chantix for smoking cessation

## 2015-09-26 NOTE — Assessment & Plan Note (Signed)
Starting chantix Counseled patient regarding SI and depression

## 2015-09-26 NOTE — Assessment & Plan Note (Signed)
Stable thyroid goiter has been biopsied in the past. Negative biopsy. Not enlarging. TSH has been normal.   Plan for monitoring with exams and yearly TSH

## 2015-09-27 ENCOUNTER — Other Ambulatory Visit: Payer: Self-pay | Admitting: Family Medicine

## 2015-09-27 DIAGNOSIS — Z1231 Encounter for screening mammogram for malignant neoplasm of breast: Secondary | ICD-10-CM

## 2015-09-28 ENCOUNTER — Telehealth: Payer: Self-pay | Admitting: Family Medicine

## 2015-09-28 DIAGNOSIS — Z0000 Encounter for general adult medical examination without abnormal findings: Secondary | ICD-10-CM

## 2015-09-28 NOTE — Telephone Encounter (Signed)
error 

## 2015-09-28 NOTE — Telephone Encounter (Signed)
Margaret Henry [id number]  09-28-15 /03-26-16  Thank you

## 2015-09-28 NOTE — Telephone Encounter (Signed)
Patient called stating that she was seen at [facility] and a request for a colonoscopy was put in, however the referral has to come from PCP. Does she need an appointment with the PCP? Please follow up.

## 2015-09-29 NOTE — Telephone Encounter (Signed)
Referral placed.

## 2015-10-07 ENCOUNTER — Telehealth: Payer: Self-pay

## 2015-10-07 ENCOUNTER — Telehealth: Payer: Self-pay | Admitting: Family Medicine

## 2015-10-07 DIAGNOSIS — J449 Chronic obstructive pulmonary disease, unspecified: Secondary | ICD-10-CM

## 2015-10-07 MED ORDER — ALBUTEROL SULFATE HFA 108 (90 BASE) MCG/ACT IN AERS: 2.0000 | INHALATION_SPRAY | RESPIRATORY_TRACT | Status: DC | PRN

## 2015-10-07 NOTE — Telephone Encounter (Addendum)
Pt calling for VENTOLIN HFA 108 (90 BASE) MCG/ACT inhaler script to be sent to [place].  Pt was informed that last prescription was sent to [facility] pharmacy, call was transferred to [facility] pharmacy.  If script is not able to be transferred to [place] pt will need new script. Pt has procedure scheduled early next week for which she needs to have inhaler.

## 2015-10-07 NOTE — Telephone Encounter (Signed)
Returned phone call to patient  Patient not available Message left on voice mail to return our call RX for her inhaler sent to pharmacy requested

## 2015-10-09 DIAGNOSIS — A630 Anogenital (venereal) warts: Secondary | ICD-10-CM

## 2015-10-09 DIAGNOSIS — K6282 Dysplasia of anus: Secondary | ICD-10-CM

## 2015-10-09 HISTORY — DX: Dysplasia of anus: K62.82

## 2015-10-09 HISTORY — DX: Anogenital (venereal) warts: A63.0

## 2015-10-10 MED FILL — VENTOLIN HFA 90 MCG INHALER: 108 (90 BAS | 30 days supply | Qty: 18 | Fill #0

## 2015-10-11 ENCOUNTER — Encounter: Payer: Self-pay | Admitting: Internal Medicine

## 2015-10-11 ENCOUNTER — Ambulatory Visit (AMBULATORY_SURGERY_CENTER): Payer: Commercial Managed Care - HMO | Admitting: Internal Medicine

## 2015-10-11 VITALS — BP 116/65 | HR 83 | Temp 96.1°F | Resp 18 | Ht 64.0 in | Wt 112.0 lb

## 2015-10-11 DIAGNOSIS — D129 Benign neoplasm of anus and anal canal: Secondary | ICD-10-CM | POA: Diagnosis not present

## 2015-10-11 DIAGNOSIS — A630 Anogenital (venereal) warts: Secondary | ICD-10-CM | POA: Diagnosis not present

## 2015-10-11 DIAGNOSIS — D120 Benign neoplasm of cecum: Secondary | ICD-10-CM | POA: Diagnosis not present

## 2015-10-11 DIAGNOSIS — K219 Gastro-esophageal reflux disease without esophagitis: Secondary | ICD-10-CM | POA: Diagnosis not present

## 2015-10-11 DIAGNOSIS — Z1211 Encounter for screening for malignant neoplasm of colon: Secondary | ICD-10-CM | POA: Diagnosis present

## 2015-10-11 DIAGNOSIS — J449 Chronic obstructive pulmonary disease, unspecified: Secondary | ICD-10-CM | POA: Diagnosis not present

## 2015-10-11 DIAGNOSIS — K6282 Dysplasia of anus: Secondary | ICD-10-CM | POA: Diagnosis not present

## 2015-10-11 MED ORDER — SODIUM CHLORIDE 0.9 % IV SOLN: 500.0000 mL | INTRAVENOUS | Status: DC

## 2015-10-11 NOTE — Op Note (Signed)
[place]  [address] [place], [id number]   COLONOSCOPY PROCEDURE REPORT  PATIENT: Margaret Henry, Margaret Henry  MR#: [id number] BIRTHDATE: 07-17-1951 , 48  yrs. old GENDER: female ENDOSCOPIST: Jerene Bears, MD REFERRED [id number] Funches, MD PROCEDURE DATE:  10/11/2015 PROCEDURE:   Colonoscopy, screening, Colonoscopy with biopsy, and Colonoscopy with snare polypectomy First Screening Colonoscopy - Avg.  risk and is 50 yrs.  old or older Yes.  Prior Negative Screening - Now for repeat screening. N/A  History of Adenoma - Now for follow-up colonoscopy & has been > or = to 3 yrs.  N/A  Polyps removed today? Yes ASA CLASS:   Class III INDICATIONS:Screening for colonic neoplasia, Colorectal Neoplasm Risk Assessment for this procedure is average risk, and 1st colonoscopy. MEDICATIONS: Monitored anesthesia care and Propofol 210 mg IV  DESCRIPTION OF PROCEDURE:   After the risks benefits and alternatives of the procedure were thoroughly explained, informed consent was obtained.  The digital rectal exam revealed a skin tag. The [id number]  endoscope was introduced through the anus and advanced to the terminal ileum which was intubated for a short distance. No adverse events experienced.   The quality of the prep was good.  (Suprep was used)  The instrument was then slowly withdrawn as the colon was fully examined. Estimated blood loss is zero unless otherwise noted in this procedure report.  COLON FINDINGS: The examined terminal ileum appeared to be normal. A sessile polyp measuring 6 mm in size was found at the cecum.  A polypectomy was performed with a cold snare.  The resection was complete, the polyp tissue was completely retrieved and sent to histology.   There was mild diverticulosis noted in the sigmoid colon.   A possible polyp measuring 6 mm in size was found in the anal canal.  Multiple biopsies were performed using cold forceps to rule out AIN.  Retroflexed  views also revealed internal hemorrhoids. The time to cecum = 3.2 Withdrawal time = 10.6   The scope was withdrawn and the procedure completed. COMPLICATIONS: There were no immediate complications.  ENDOSCOPIC IMPRESSION: 1.   The examined terminal ileum appeared to be normal 2.   Sessile polyp was found at the cecum; polypectomy was performed with a cold snare 3.   Mild diverticulosis was noted in the sigmoid colon 4.   Anal canal polyp versus skin tag; multiple biopsies were performed using cold forceps  RECOMMENDATIONS: 1.  Await pathology results 2.  High fiber diet 3.  Timing of repeat colonoscopy will be determined by pathology findings. 4.  You will receive a letter within 1-2 weeks with the results of your biopsy as well as final recommendations.  Please call my office if you have not received a letter after 3 weeks.  eSigned:  Jerene Bears, MD 10/11/2015 3:29 PM   cc: Boykin Nearing, MD, the patient

## 2015-10-11 NOTE — Progress Notes (Signed)
Called to room to assist during endoscopic procedure.  Patient ID and intended procedure confirmed with present staff. Received instructions for my participation in the procedure from the performing physician.  

## 2015-10-11 NOTE — Progress Notes (Signed)
Report to PACU, RN, vss, BBS= Clear.  

## 2015-10-11 NOTE — Patient Instructions (Signed)
Impressions/recommendations:  Polyp (handout given) Diverticulosis (handout given) High Fiber diet (handout given)  Repeat colonoscopy pending pathology results.  YOU HAD AN ENDOSCOPIC PROCEDURE TODAY AT THE [facility]:   Refer to the procedure report that was given to you for any specific questions about what was found during the examination.  If the procedure report does not answer your questions, please call your gastroenterologist to clarify.  If you requested that your care partner not be given the details of your procedure findings, then the procedure report has been included in a sealed envelope for you to review at your convenience later.  YOU SHOULD EXPECT: Some feelings of bloating in the abdomen. Passage of more gas than usual.  Walking can help get rid of the air that was put into your GI tract during the procedure and reduce the bloating. If you had a lower endoscopy (such as a colonoscopy or flexible sigmoidoscopy) you may notice spotting of blood in your stool or on the toilet paper. If you underwent a bowel prep for your procedure, you may not have a normal bowel movement for a few days.  Please Note:  You might notice some irritation and congestion in your nose or some drainage.  This is from the oxygen used during your procedure.  There is no need for concern and it should clear up in a day or so.  SYMPTOMS TO REPORT IMMEDIATELY:   Following lower endoscopy (colonoscopy or flexible sigmoidoscopy):  Excessive amounts of blood in the stool  Significant tenderness or worsening of abdominal pains  Swelling of the abdomen that is new, acute  Fever of 100F or higher   For urgent or emergent issues, a gastroenterologist can be reached at any hour by calling [phone].   DIET: Your first meal following the procedure should be a small meal and then it is ok to progress to your normal diet. Heavy or fried foods are harder to digest and may make you feel nauseous  or bloated.  Likewise, meals heavy in dairy and vegetables can increase bloating.  Drink plenty of fluids but you should avoid alcoholic beverages for 24 hours.  ACTIVITY:  You should plan to take it easy for the rest of today and you should NOT DRIVE or use heavy machinery until tomorrow (because of the sedation medicines used during the test).    FOLLOW UP: Our staff will call the number listed on your records the next business day following your procedure to check on you and address any questions or concerns that you may have regarding the information given to you following your procedure. If we do not reach you, we will leave a message.  However, if you are feeling well and you are not experiencing any problems, there is no need to return our call.  We will assume that you have returned to your regular daily activities without incident.  If any biopsies were taken you will be contacted by phone or by letter within the next 1-3 weeks.  Please call us at [phone] if you have not heard about the biopsies in 3 weeks.    SIGNATURES/CONFIDENTIALITY: You and/or your care partner have signed paperwork which will be entered into your electronic medical record.  These signatures attest to the fact that that the information above on your After Visit Summary has been reviewed and is understood.  Full responsibility of the confidentiality of this discharge information lies with you and/or your care-partner. 

## 2015-10-12 ENCOUNTER — Telehealth: Payer: Self-pay | Admitting: *Deleted

## 2015-10-12 NOTE — Telephone Encounter (Signed)
Date of birth verified by pt  Lab results given Negative HIV and Hep C LDL 169, continue taking medication as prescribed  Pt verbalized understanding

## 2015-10-12 NOTE — Telephone Encounter (Signed)
  Follow up Call-  Call back number 10/11/2015  Post procedure Call Back phone  # [phone]  Permission to leave phone message Yes     Patient questions:  Do you have a fever, pain , or abdominal swelling? No. Pain Score  0 *  Have you tolerated food without any problems? Yes.    Have you been able to return to your normal activities? Yes.    Do you have any questions about your discharge instructions: Diet   No. Medications  No. Follow up visit  No.  Do you have questions or concerns about your Care? No.  Actions: * If pain score is 4 or above: No action needed, pain <4.

## 2015-10-12 NOTE — Telephone Encounter (Signed)
-----   Message from Boykin Nearing, MD sent at 09/27/2015  8:56 AM EST ----- Screening HIV and hep C negative LDL 169 continue current treatment plan

## 2015-10-14 ENCOUNTER — Other Ambulatory Visit: Payer: Self-pay | Admitting: Family Medicine

## 2015-10-14 DIAGNOSIS — J42 Unspecified chronic bronchitis: Secondary | ICD-10-CM

## 2015-10-14 MED ORDER — FLUTICASONE-SALMETEROL 500-50 MCG/DOSE IN AEPB: 1.0000 | INHALATION_SPRAY | Freq: Two times a day (BID) | RESPIRATORY_TRACT | Status: DC

## 2015-10-14 MED FILL — ADVAIR 500/50 DISKUS: 500-50 | 30 days supply | Qty: 60 | Fill #0

## 2015-10-14 NOTE — Telephone Encounter (Signed)
Called patient's pharmacy Left VM to ok refill of albuterol, per fax she has been requesting albuterol q 20 days adn using up to 10 puffs per day Also increased advair dose

## 2015-10-17 ENCOUNTER — Ambulatory Visit: Payer: Commercial Managed Care - HMO

## 2015-10-18 ENCOUNTER — Encounter: Payer: Self-pay | Admitting: Internal Medicine

## 2015-10-19 MED FILL — ALBUTEROL 0.083% INHAL SOLN: (2.5 MG/3ML | 7 days supply | Qty: 75 | Fill #2

## 2015-10-19 MED FILL — SPIRIVA 18 MCG CP-HANDIHALE: 18 | 30 days supply | Qty: 30 | Fill #2

## 2015-11-02 ENCOUNTER — Ambulatory Visit
Admission: RE | Admit: 2015-11-02 | Discharge: 2015-11-02 | Disposition: A | Discharge status: Home / Self Care | Payer: Commercial Managed Care - HMO | Source: Ambulatory Visit | Attending: Family Medicine | Admitting: Family Medicine

## 2015-11-02 DIAGNOSIS — Z1231 Encounter for screening mammogram for malignant neoplasm of breast: Secondary | ICD-10-CM | POA: Diagnosis not present

## 2015-11-04 MED FILL — ATORVASTATIN 40 MG TABLET: 40 | 30 days supply | Qty: 30 | Fill #1

## 2015-11-04 MED FILL — VENTOLIN HFA 90 MCG INHALER: 108 (90 BAS | 30 days supply | Qty: 18 | Fill #1

## 2015-11-07 ENCOUNTER — Other Ambulatory Visit: Payer: Self-pay | Admitting: Surgery

## 2015-11-07 DIAGNOSIS — D013 Carcinoma in situ of anus and anal canal: Secondary | ICD-10-CM | POA: Diagnosis not present

## 2015-11-07 NOTE — H&P (Signed)
Margaret Henry 11/07/2015 11:26 AM Location: [facility] Patient #: [id number] DOB: [birth year] Single / Language: Cleophus Molt / Race: White Female  History of Present Illness Margaret Hector MD; 11/07/2015 12:18 PM) Patient words: anal lesion.  The patient is a 65 year old female who presents with anal lesions. Note for "Anal lesions": Patient sent for surgical consultation by Dr. Zenovia Jarred with [facility] gastroenterology for concern of anal rectal lesion. Request for removal.  Pleasant smoking female with chronic COPD and bronchitis. Underwent screening colonoscopy. Small tubular adenoma removed from the cecum. Lesion seen in anal canal. 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    Diagnosis 1. Surgical [P], cecum, polyp - TUBULAR ADENOMA(X1). - HIGH GRADE DYSPLASIA IS NOT IDENTIFIED. 2. 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 Dr. Saralyn Pilar has reviewed this part and agree. [name] MD Pathologist, Electronic Signature (Case signed 10/18/2015)   Patient struggles with occasional constipation but usually has a bowel movement about every day. Mild hemorrhoids but nothing too severe. She does not recall any history of warts or condyloma or sexual transmitted diseases. Cutback on her tobacco smoking became get below a half pack a day. Attacks. She comes today with her son. Claims she can walk and [employer] for about 20 minutes without difficulty. Not on oxygen.  No personal nor family history of GI/colon cancer, inflammatory bowel disease, irritable bowel syndrome, allergy such as Celiac Sprue, dietary/dairy problems, colitis, ulcers nor gastritis. No recent sick contacts/gastroenteritis. No travel outside the country. No changes in diet. No dysphagia to solids or liquids. No significant heartburn or reflux. No  hematochezia, hematemesis, coffee ground emesis. No evidence of prior gastric/peptic ulceration.   Other Problems Marjean Donna, CMA; 11/07/2015 11:26 AM) Chronic Obstructive Lung Disease Hemorrhoids Hypercholesterolemia Melanoma Oophorectomy Bilateral. Thyroid Disease  Past Surgical History Marjean Donna, CMA; 11/07/2015 11:26 AM) Colon Polyp Removal - Colonoscopy Hysterectomy (not due to cancer) - Complete Oral Surgery  Diagnostic Studies History Marjean Donna, CMA; 11/07/2015 11:26 AM) Colonoscopy within last year Mammogram within last year Pap Smear >5 years ago  Allergies Marjean Donna, CMA; 11/07/2015 11:28 AM) Hydrocodone-Acetaminophen *ANALGESICS - OPIOID* Propoxyphene Compound *ANALGESICS - OPIOID*  Medication History (Sonya Bynum, CMA; 11/07/2015 11:28 AM) Advair Diskus (500-50MCG/DOSE Aero Pow Br Act, Inhalation) Active. Albuterol Sulfate ((2.5 MG/3ML)0.083% Nebulized Soln, Inhalation) Active. Atorvastatin Calcium (40MG  Tablet, Oral) Active. Ventolin HFA (108 (90 Base)MCG/ACT Aerosol Soln, Inhalation) Active. Spiriva HandiHaler (18MCG Capsule, Inhalation) Active. RaNITidine HCl (150MG  Tablet, Oral) Active. Medications Reconciled  Social History Marjean Donna, CMA; 11/07/2015 11:26 AM) Alcohol use Occasional alcohol use. Caffeine use Coffee, Tea. No drug use Tobacco use Current every day smoker.  Family History Marjean Donna, [place]; 11/07/2015 11:26 AM) Alcohol Abuse Mother. Heart Disease Father, Mother. Heart disease in female family member before age 36 Heart disease in female family member before age 16 Respiratory Condition Father.  Pregnancy / Birth History Marjean Donna, [place]; 11/07/2015 11:26 AM) Age at menarche 26 years. Age of menopause <44 Contraceptive History Intrauterine device, Oral contraceptives. Gravida 3 Maternal age <15 Para 3     Review of Systems ([place]; 11/07/2015 11:26 AM) Skin Not Present- Change in  Wart/Mole, Dryness, Hives, Jaundice, New Lesions, Non-Healing Wounds, Rash and Ulcer. HEENT Present- Seasonal Allergies and Wears glasses/contact lenses. Not Present- Earache, Hearing Loss, Hoarseness, Nose Bleed, Oral Ulcers, Ringing in the Ears, Sinus Pain, Sore Throat, Visual Disturbances and  Yellow Eyes. Respiratory Present- Chronic Cough and Difficulty Breathing. Not Present- Bloody sputum, Snoring and Wheezing. Breast Not Present- Breast Mass, Breast Pain, Nipple Discharge and Skin Changes. Cardiovascular Present- Shortness of Breath. Not Present- Chest Pain, Difficulty Breathing Lying Down, Leg Cramps, Palpitations, Rapid Heart Rate and Swelling of Extremities. Gastrointestinal Present- Bloating, Gets full quickly at meals and Hemorrhoids. Not Present- Abdominal Pain, Bloody Stool, Change in Bowel Habits, Chronic diarrhea, Constipation, Difficulty Swallowing, Excessive gas, Indigestion, Nausea, Rectal Pain and Vomiting. Female Genitourinary Not Present- Frequency, Nocturia, Painful Urination, Pelvic Pain and Urgency. Musculoskeletal Not Present- Back Pain, Joint Pain, Joint Stiffness, Muscle Pain, Muscle Weakness and Swelling of Extremities. Neurological Not Present- Decreased Memory, Fainting, Headaches, Numbness, Seizures, Tingling, Tremor, Trouble walking and Weakness. Psychiatric Not Present- Anxiety, Bipolar, Change in Sleep Pattern, Depression, Fearful and Frequent crying. Endocrine Present- Cold Intolerance. Not Present- Excessive Hunger, Hair Changes, Heat Intolerance, Hot flashes and New Diabetes. Hematology Present- Easy Bruising. Not Present- Excessive bleeding, Gland problems, HIV and Persistent Infections.  Vitals (Sonya Bynum CMA; 11/07/2015 11:27 AM) 11/07/2015 11:27 AM Weight: 112 lb Height: 64in Body Surface Area: 1.53 m Body Mass Index: 19.22 kg/m  Pulse: 74 (Regular)  BP: 124/80 (Sitting, Left Arm, Standard)      Physical Exam Margaret Hector MD; 11/07/2015  12:18 PM)  General Mental Status-Alert. General Appearance-Not in acute distress, Not Sickly. Orientation-Oriented X3. Hydration-Well hydrated. Voice-Normal. Note: Older than stated age  Integumentary Global Assessment Upon inspection and palpation of skin surfaces of the - Axillae: non-tender, no inflammation or ulceration, no drainage. and Distribution of scalp and body hair is normal. General Characteristics Temperature - normal warmth is noted.  Head and Neck Head-normocephalic, atraumatic with no lesions or palpable masses. Face Global Assessment - atraumatic, no absence of expression. Neck Global Assessment - no abnormal movements, no bruit auscultated on the right, no bruit auscultated on the left, no decreased range of motion, non-tender. Trachea-midline. Thyroid Gland Characteristics - non-tender.  Eye Eyeball - Left-Extraocular movements intact, No Nystagmus. Eyeball - Right-Extraocular movements intact, No Nystagmus. Cornea - Left-No Hazy. Cornea - Right-No Hazy. Sclera/Conjunctiva - Left-No scleral icterus, No Discharge. Sclera/Conjunctiva - Right-No scleral icterus, No Discharge. Pupil - Left-Direct reaction to light normal. Pupil - Right-Direct reaction to light normal.  ENMT Ears Pinna - Left - no drainage observed, no generalized tenderness observed. Right - no drainage observed, no generalized tenderness observed. Nose and Sinuses External Inspection of the Nose - no destructive lesion observed. Inspection of the nares - Left - quiet respiration. Right - quiet respiration. Mouth and Throat Lips - Upper Lip - no fissures observed, no pallor noted. Lower Lip - no fissures observed, no pallor noted. Nasopharynx - no discharge present. Oral Cavity/Oropharynx - Tongue - no dryness observed. Oral Mucosa - no cyanosis observed. Hypopharynx - no evidence of airway distress observed.  Chest and Lung Exam Inspection Movements -  Normal and Symmetrical. Accessory muscles - No use of accessory muscles in breathing. Palpation Palpation of the chest reveals - Non-tender. Auscultation Breath sounds - Normal and Clear.  Cardiovascular Auscultation Rhythm - Regular. Murmurs & Other Heart Sounds - Auscultation of the heart reveals - No Murmurs and No Systolic Clicks.  Abdomen Inspection Inspection of the abdomen reveals - No Visible peristalsis and No Abnormal pulsations. Umbilicus - No Bleeding, No Urine drainage. Palpation/Percussion Palpation and Percussion of the abdomen reveal - Soft, Non Tender, No Rebound tenderness, No Rigidity (guarding) and No Cutaneous hyperesthesia. Note: Soft and flat. Low midline  incision. No hernias. No pain or discomfort.  Female Genitourinary Sexual Maturity Tanner 5 - Adult hair pattern. Note: No inguinal hernias. No lymphadenopathy. No vaginal bleeding nor discharge  Rectal Note: 1 cm pedunculated polyp LEFT anterior anal canal, 1 cm from anal verge. Tip with some mild white plaque consistent with verrucous change     Exam done with assistance of female Medical Assistant in the room.  Perianal skin clean with good hygiene. No pruritis ani. No pilonidal disease. Prominent round coccyx tip. No fissure. No abscess/fistula.  Normal sphincter tone. Tolerates digital and anoscopic rectal exam. Grade 2 internal hemorrhoids with somewhat floppy rectum but no true prolapse. No proctitis.  Peripheral Vascular Upper Extremity Inspection - Left - No Cyanotic nailbeds, Not Ischemic. Right - No Cyanotic nailbeds, Not Ischemic.  Neurologic Neurologic evaluation reveals -normal attention span and ability to concentrate, able to name objects and repeat phrases. Appropriate fund of knowledge , normal sensation and normal coordination. Mental Status Affect - not angry, not paranoid. Cranial Nerves-Normal Bilaterally. Gait-Normal.  Neuropsychiatric Mental status exam performed  with findings of-able to articulate well with normal speech/language, rate, volume and coherence, thought content normal with ability to perform basic computations and apply abstract reasoning and no evidence of hallucinations, delusions, obsessions or homicidal/suicidal ideation.  Musculoskeletal Global Assessment Spine, Ribs and Pelvis - no instability, subluxation or laxity. Right Upper Extremity - no instability, subluxation or laxity.  Lymphatic Head & Neck  General Head & Neck Lymphatics: Bilateral - Description - No Localized lymphadenopathy. Axillary  General Axillary Region: Bilateral - Description - No Localized lymphadenopathy. Femoral & Inguinal  Generalized Femoral & Inguinal Lymphatics: Left - Description - No Localized lymphadenopathy. Right - Description - No Localized lymphadenopathy.    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 NO history of prior warts/condyloma  I think this warrants removal. Would do with outpatient surgery to get good margins and make sure there are no other abnormalities. Possible hemorrhoid ligation if there are concerns. Patient & son agree.  Given her COPD, may try and do decubitus with some sedation/MAC. However ideally would have the patient in prone positioning as tolerated given the anterior location.  Current Plans You are being scheduled for surgery - Our schedulers will call you.  You should hear from our office's scheduling department within 5 working days about the location, date, and time of surgery. We try to make accommodations for patient's preferences in scheduling surgery, but sometimes the OR schedule or the surgeon's schedule prevents [place] from making those accommodations.  If you have not heard from our office [phone]) in 5 working days, call the office and ask for your surgeon's  nurse.  If you have other questions about your diagnosis, plan, or surgery, call the office and ask for your surgeon's nurse.  The anatomy & physiology of the anorectal region was discussed. The pathophysiology of anorectal warts and differential diagnosis was discussed. Natural history risks without surgery was discussed such as further growth and cancer. I stressed the importance of office follow-up to catch early recurrence & minimize/halt progression of disease. Interventions such as cauterization or cryotherapy by topical agents were discussed.  The patient's symptoms are not adequately controlled by non-operative treatments. I feel the risks & problems of no surgery outweigh the operative risks; therefore, I recommended surgery to treat the anal warts by removal, ablation and/or cauterization.  Risks such as bleeding, infection, need for further treatment, heart attack, death, and other risks were discussed. I noted a good likelihood this will help address the problem. Goals of post-operative recovery were discussed as well. Possibility that this will not correct all symptoms was explained. Post-operative pain, bleeding, constipation, and other problems after surgery were discussed. We will work to minimize complications. Educational handouts further explaining the pathology, treatment options, and bowel regimen were given as well. Questions were answered. The patient expresses understanding & wishes to proceed with surgery.  Pt Education - [facility] Anal Warts (Issam Carlyon) Pt Education - [facility] Rectal Prep for Anorectal outpatient/office surgery: discussed with patient and provided information. Pt Education - [facility] Rectal Surgery HCI (Nissan Frazzini): discussed with patient and provided information. Pt Education - [facility] Pelvic Floor Exercises (Keg[place]) and Dysfunction HCI (Emmylou Bieker) Pt Education - [facility] Good Bowel Health (Kiante Ciavarella)  Margaret Henry, M.D., F.A.C.S. Gastrointestinal and Minimally Invasive Surgery [facility] [address] [place], [place] [address] [phone] Main / Paging

## 2015-11-14 MED FILL — ALBUTEROL 0.083% INHAL SOLN: (2.5 MG/3ML | 7 days supply | Qty: 75 | Fill #3

## 2015-11-14 MED FILL — SPIRIVA 18 MCG CP-HANDIHALE: 18 | 30 days supply | Qty: 30 | Fill #3

## 2015-11-28 MED FILL — ADVAIR 500/50 DISKUS: 500-50 | 30 days supply | Qty: 60 | Fill #1

## 2015-11-28 MED FILL — VENTOLIN HFA 90 MCG INHALER: 108 (90 BAS | 30 days supply | Qty: 18 | Fill #2

## 2015-12-05 ENCOUNTER — Other Ambulatory Visit: Payer: Self-pay | Admitting: Internal Medicine

## 2015-12-05 MED FILL — ALBUTEROL 0.083% INHAL SOLN: (2.5 MG/3ML | 6 days supply | Qty: 75 | Fill #0

## 2015-12-07 ENCOUNTER — Encounter (HOSPITAL_COMMUNITY)
Admission: RE | Admit: 2015-12-07 | Discharge: 2015-12-07 | Disposition: A | Discharge status: Home / Self Care | Payer: Commercial Managed Care - HMO | Source: Ambulatory Visit | Attending: Surgery | Admitting: Surgery

## 2015-12-07 ENCOUNTER — Encounter (HOSPITAL_COMMUNITY): Payer: Self-pay

## 2015-12-07 DIAGNOSIS — Z01810 Encounter for preprocedural cardiovascular examination: Secondary | ICD-10-CM | POA: Insufficient documentation

## 2015-12-07 DIAGNOSIS — K629 Disease of anus and rectum, unspecified: Secondary | ICD-10-CM | POA: Insufficient documentation

## 2015-12-07 DIAGNOSIS — Z01812 Encounter for preprocedural laboratory examination: Secondary | ICD-10-CM | POA: Diagnosis not present

## 2015-12-07 DIAGNOSIS — R9431 Abnormal electrocardiogram [ECG] [EKG]: Secondary | ICD-10-CM | POA: Diagnosis not present

## 2015-12-07 HISTORY — DX: Reserved for inherently not codable concepts without codable children: IMO0001

## 2015-12-07 HISTORY — DX: Other complications of anesthesia, initial encounter: T88.59XA

## 2015-12-07 HISTORY — DX: Pneumonia, unspecified organism: J18.9

## 2015-12-07 HISTORY — DX: Diverticulitis of intestine, part unspecified, without perforation or abscess without bleeding: K57.92

## 2015-12-07 HISTORY — DX: Polyp of colon: K63.5

## 2015-12-07 HISTORY — DX: Adverse effect of unspecified anesthetic, initial encounter: T41.45XA

## 2015-12-07 HISTORY — DX: Dizziness and giddiness: R42

## 2015-12-07 HISTORY — DX: Unspecified hemorrhoids: K64.9

## 2015-12-07 LAB — CBC
HCT: 45.4 % (ref 36.0–46.0)
Hemoglobin: 15.2 g/dL — ABNORMAL HIGH (ref 12.0–15.0)
MCH: 32.1 pg (ref 26.0–34.0)
MCHC: 33.5 g/dL (ref 30.0–36.0)
MCV: 95.8 fL (ref 78.0–100.0)
Platelets: 251 10*3/uL (ref 150–400)
RBC: 4.74 MIL/uL (ref 3.87–5.11)
RDW: 13 % (ref 11.5–15.5)
WBC: 8.1 10*3/uL (ref 4.0–10.5)

## 2015-12-07 LAB — BASIC METABOLIC PANEL
Anion gap: 8 (ref 5–15)
BUN: 10 mg/dL (ref 6–20)
CALCIUM: 9.7 mg/dL (ref 8.9–10.3)
CO2: 28 mmol/L (ref 22–32)
CREATININE: 0.58 mg/dL (ref 0.44–1.00)
Chloride: 106 mmol/L (ref 101–111)
GFR calc Af Amer: >60 mL/min (ref >60)
GLUCOSE: 88 mg/dL (ref 65–99)
Potassium: 4.5 mmol/L (ref 3.5–5.1)
Sodium: 142 mmol/L (ref 135–145)

## 2015-12-07 NOTE — Progress Notes (Signed)
CXR in epic 05/06/2015

## 2015-12-07 NOTE — Patient Instructions (Addendum)
HATTYE CASTER  12/07/2015   Your procedure is scheduled on: Wednesday December 14, 2015  Report to [facility] Main  Entrance take [place]  elevators to 3rd floor to  [place] at 11:00 AM.  Call this number if you have problems the morning of surgery [phone]   Remember: ONLY 1 PERSON MAY GO WITH YOU TO SHORT STAY TO GET  READY MORNING OF [place].  No food or drink after midnight.     Take these medicines the morning of surgery: May use Ventolin Inhaler if needed; May use Albuterol Neb Treatment if needed; Advair; Sprivia                                You may not have any metal on your body including hair pins and              piercings  Do not wear jewelry, make-up, lotions, powders or perfumes, deodorant             Do not wear nail polish.  Do not shave  48 hours prior to surgery.              Do not bring valuables to the hospital. [place].  Contacts, dentures or bridgework may not be worn into surgery.      Patients discharged the day of surgery will not be allowed to drive home.  Name and phone number of your driver: Charm Rings (sister)   Special Instructions: FOLLOW SURGEON'S INSTRUCTION IN REGARDS TO BOWEL PREPARATION PRIOR TO SURGICAL PROCEDURE              _____________________________________________________________________             [facility] - Preparing for Surgery Before surgery, you can play an important role.  Because skin is not sterile, your skin needs to be as free of germs as possible.  You can reduce the number of germs on your skin by washing with CHG (chlorahexidine gluconate) soap before surgery.  CHG is an antiseptic cleaner which kills germs and bonds with the skin to continue killing germs even after washing. Please DO NOT use if you have an allergy to CHG or antibacterial soaps.  If your skin becomes reddened/irritated stop using the CHG and inform your nurse when you  arrive at Short Stay. Do not shave (including legs and underarms) for at least 48 hours prior to the first CHG shower.  You may shave your face/neck. Please follow these instructions carefully:  1.  Shower with CHG Soap the night before surgery and the  morning of Surgery.  2.  If you choose to wash your hair, wash your hair first as usual with your  normal  shampoo.  3.  After you shampoo, rinse your hair and body thoroughly to remove the  shampoo.                           4.  Use CHG as you would any other liquid soap.  You can apply chg directly  to the skin and wash  Gently with a scrungie or clean washcloth.  5.  Apply the CHG Soap to your body ONLY FROM THE NECK DOWN.   Do not use on face/ open                           Wound or open sores. Avoid contact with eyes, ears mouth and genitals (private parts).                       Wash face,  Genitals (private parts) with your normal soap.             6.  Wash thoroughly, paying special attention to the area where your surgery  will be performed.  7.  Thoroughly rinse your body with warm water from the neck down.  8.  DO NOT shower/wash with your normal soap after using and rinsing off  the CHG Soap.                9.  Pat yourself dry with a clean towel.            10.  Wear clean pajamas.            11.  Place clean sheets on your bed the night of your first shower and do not  sleep with pets. Day of Surgery : Do not apply any lotions/deodorants the morning of surgery.  Please wear clean clothes to the hospital/surgery center.  FAILURE TO FOLLOW THESE INSTRUCTIONS MAY RESULT IN THE CANCELLATION OF YOUR SURGERY PATIENT SIGNATURE_________________________________  NURSE SIGNATURE__________________________________  ________________________________________________________________________

## 2015-12-14 ENCOUNTER — Ambulatory Visit (HOSPITAL_COMMUNITY): Payer: Commercial Managed Care - HMO | Admitting: Anesthesiology

## 2015-12-14 ENCOUNTER — Encounter (HOSPITAL_COMMUNITY): Payer: Self-pay | Admitting: *Deleted

## 2015-12-14 ENCOUNTER — Encounter (HOSPITAL_COMMUNITY)
Admission: RE | Disposition: A | Discharge status: Home / Self Care | Payer: Self-pay | Source: Ambulatory Visit | Attending: Surgery

## 2015-12-14 ENCOUNTER — Ambulatory Visit (HOSPITAL_COMMUNITY)
Admission: RE | Admit: 2015-12-14 | Discharge: 2015-12-14 | Disposition: A | Discharge status: Home / Self Care | Payer: Commercial Managed Care - HMO | Source: Ambulatory Visit | Attending: Surgery | Admitting: Surgery

## 2015-12-14 DIAGNOSIS — K219 Gastro-esophageal reflux disease without esophagitis: Secondary | ICD-10-CM | POA: Insufficient documentation

## 2015-12-14 DIAGNOSIS — Z79899 Other long term (current) drug therapy: Secondary | ICD-10-CM | POA: Diagnosis not present

## 2015-12-14 DIAGNOSIS — Z7951 Long term (current) use of inhaled steroids: Secondary | ICD-10-CM | POA: Diagnosis not present

## 2015-12-14 DIAGNOSIS — A630 Anogenital (venereal) warts: Secondary | ICD-10-CM | POA: Insufficient documentation

## 2015-12-14 DIAGNOSIS — E7800 Pure hypercholesterolemia, unspecified: Secondary | ICD-10-CM | POA: Diagnosis not present

## 2015-12-14 DIAGNOSIS — K6282 Dysplasia of anus: Secondary | ICD-10-CM | POA: Insufficient documentation

## 2015-12-14 DIAGNOSIS — F17200 Nicotine dependence, unspecified, uncomplicated: Secondary | ICD-10-CM | POA: Insufficient documentation

## 2015-12-14 DIAGNOSIS — Z86010 Personal history of colonic polyps: Secondary | ICD-10-CM | POA: Insufficient documentation

## 2015-12-14 DIAGNOSIS — K641 Second degree hemorrhoids: Secondary | ICD-10-CM | POA: Diagnosis not present

## 2015-12-14 DIAGNOSIS — J449 Chronic obstructive pulmonary disease, unspecified: Secondary | ICD-10-CM | POA: Diagnosis not present

## 2015-12-14 DIAGNOSIS — K629 Disease of anus and rectum, unspecified: Secondary | ICD-10-CM | POA: Diagnosis present

## 2015-12-14 DIAGNOSIS — K644 Residual hemorrhoidal skin tags: Secondary | ICD-10-CM | POA: Diagnosis not present

## 2015-12-14 DIAGNOSIS — D013 Carcinoma in situ of anus and anal canal: Secondary | ICD-10-CM | POA: Diagnosis not present

## 2015-12-14 HISTORY — PX: RECTAL EXAM UNDER ANESTHESIA: SHX6399

## 2015-12-14 SURGERY — EXAM UNDER ANESTHESIA, RECTUM
Anesthesia: General

## 2015-12-14 MED ORDER — ONDANSETRON HCL 4 MG/2ML IJ SOLN
INTRAMUSCULAR | Status: AC
  Filled 2015-12-14: qty 2

## 2015-12-14 MED ORDER — DEXAMETHASONE SODIUM PHOSPHATE 10 MG/ML IJ SOLN
INTRAMUSCULAR | Status: DC | PRN
  Administered 2015-12-14: 10 mg via INTRAVENOUS

## 2015-12-14 MED ORDER — OXYCODONE HCL 5 MG PO TABS: 5.0000 mg | ORAL_TABLET | ORAL | Status: DC | PRN

## 2015-12-14 MED ORDER — LIDOCAINE HCL (CARDIAC) 20 MG/ML IV SOLN
INTRAVENOUS | Status: DC | PRN
  Administered 2015-12-14: 50 mg via INTRAVENOUS

## 2015-12-14 MED ORDER — SUGAMMADEX SODIUM 200 MG/2ML IV SOLN
INTRAVENOUS | Status: DC | PRN
  Administered 2015-12-14: 100 mg via INTRAVENOUS

## 2015-12-14 MED ORDER — FENTANYL CITRATE (PF) 100 MCG/2ML IJ SOLN: 25.0000 ug | INTRAMUSCULAR | Status: DC | PRN

## 2015-12-14 MED ORDER — LIDOCAINE HCL (CARDIAC) 20 MG/ML IV SOLN
INTRAVENOUS | Status: AC
  Filled 2015-12-14: qty 5

## 2015-12-14 MED ORDER — KETOROLAC TROMETHAMINE 30 MG/ML IJ SOLN
INTRAMUSCULAR | Status: DC | PRN
  Administered 2015-12-14: 30 mg via INTRAVENOUS

## 2015-12-14 MED ORDER — SODIUM CHLORIDE 0.9 % IJ SOLN
INTRAMUSCULAR | Status: AC
  Filled 2015-12-14: qty 10

## 2015-12-14 MED ORDER — PROPOFOL 10 MG/ML IV BOLUS
INTRAVENOUS | Status: AC
Start: 2015-12-14 — End: 2015-12-14
  Filled 2015-12-14: qty 20

## 2015-12-14 MED ORDER — PROPOFOL 10 MG/ML IV BOLUS
INTRAVENOUS | Status: DC | PRN
  Administered 2015-12-14: 100 mg via INTRAVENOUS
  Administered 2015-12-14 (×2): 20 mg via INTRAVENOUS
  Administered 2015-12-14: 30 mg via INTRAVENOUS

## 2015-12-14 MED ORDER — ALBUTEROL SULFATE HFA 108 (90 BASE) MCG/ACT IN AERS
INHALATION_SPRAY | RESPIRATORY_TRACT | Status: DC | PRN
  Administered 2015-12-14 (×2): 2 via RESPIRATORY_TRACT

## 2015-12-14 MED ORDER — ONDANSETRON HCL 4 MG/2ML IJ SOLN
INTRAMUSCULAR | Status: DC | PRN
  Administered 2015-12-14: 4 mg via INTRAVENOUS

## 2015-12-14 MED ORDER — KETOROLAC TROMETHAMINE 30 MG/ML IJ SOLN
INTRAMUSCULAR | Status: AC
Start: 2015-12-14 — End: 2015-12-14
  Filled 2015-12-14: qty 1

## 2015-12-14 MED ORDER — PHENYLEPHRINE 40 MCG/ML (10ML) SYRINGE FOR IV PUSH (FOR BLOOD PRESSURE SUPPORT)
PREFILLED_SYRINGE | INTRAVENOUS | Status: AC
  Filled 2015-12-14: qty 10

## 2015-12-14 MED ORDER — SUCCINYLCHOLINE CHLORIDE 20 MG/ML IJ SOLN
INTRAMUSCULAR | Status: DC | PRN
  Administered 2015-12-14: 100 mg via INTRAVENOUS

## 2015-12-14 MED ORDER — SODIUM CHLORIDE 0.9 % IJ SOLN
INTRAMUSCULAR | Status: DC | PRN
  Administered 2015-12-14: 20 mL

## 2015-12-14 MED ORDER — PHENYLEPHRINE HCL 10 MG/ML IJ SOLN
INTRAMUSCULAR | Status: DC | PRN
  Administered 2015-12-14 (×2): 40 ug via INTRAVENOUS

## 2015-12-14 MED ORDER — ONDANSETRON HCL 4 MG/2ML IJ SOLN: 4.0000 mg | Freq: Once | INTRAMUSCULAR | Status: DC | PRN

## 2015-12-14 MED ORDER — 0.9 % SODIUM CHLORIDE (POUR BTL) OPTIME
TOPICAL | Status: DC | PRN
  Administered 2015-12-14: 1000 mL

## 2015-12-14 MED ORDER — ALBUTEROL SULFATE HFA 108 (90 BASE) MCG/ACT IN AERS
INHALATION_SPRAY | RESPIRATORY_TRACT | Status: AC
  Filled 2015-12-14: qty 6.7

## 2015-12-14 MED ORDER — ROCURONIUM BROMIDE 100 MG/10ML IV SOLN
INTRAVENOUS | Status: DC | PRN
  Administered 2015-12-14: 20 mg via INTRAVENOUS

## 2015-12-14 MED ORDER — LACTATED RINGERS IV SOLN
INTRAVENOUS | Status: DC
  Administered 2015-12-14: 14:00:00 via INTRAVENOUS

## 2015-12-14 MED ORDER — BUPIVACAINE LIPOSOME 1.3 % IJ SUSP
20.0000 mL | Freq: Once | INTRAMUSCULAR | Status: AC
  Administered 2015-12-14: 20 mL
  Filled 2015-12-14: qty 20

## 2015-12-14 MED ORDER — LACTATED RINGERS IV SOLN
INTRAVENOUS | Status: DC
  Administered 2015-12-14: 1000 mL via INTRAVENOUS

## 2015-12-14 MED ORDER — SUGAMMADEX SODIUM 200 MG/2ML IV SOLN
INTRAVENOUS | Status: AC
Start: 2015-12-14 — End: 2015-12-14
  Filled 2015-12-14: qty 2

## 2015-12-14 MED ORDER — NAPROXEN 500 MG PO TABS: 500.0000 mg | ORAL_TABLET | Freq: Two times a day (BID) | ORAL | Status: DC

## 2015-12-14 MED ORDER — FENTANYL CITRATE (PF) 100 MCG/2ML IJ SOLN
INTRAMUSCULAR | Status: AC
  Filled 2015-12-14: qty 2

## 2015-12-14 MED ORDER — BUPIVACAINE-EPINEPHRINE 0.25% -1:200000 IJ SOLN
INTRAMUSCULAR | Status: AC
  Filled 2015-12-14: qty 1

## 2015-12-14 MED ORDER — BUPIVACAINE-EPINEPHRINE 0.25% -1:200000 IJ SOLN
INTRAMUSCULAR | Status: DC | PRN
  Administered 2015-12-14: 20 mL

## 2015-12-14 MED ORDER — FENTANYL CITRATE (PF) 100 MCG/2ML IJ SOLN
INTRAMUSCULAR | Status: DC | PRN
  Administered 2015-12-14: 50 ug via INTRAVENOUS
  Administered 2015-12-14: 25 ug via INTRAVENOUS

## 2015-12-14 MED FILL — NAPROXEN 500 MG TABLET: 500 | 20 days supply | Qty: 40 | Fill #0

## 2015-12-14 SURGICAL SUPPLY — 32 items
APL SKNCLS STERI-STRIP NONHPOA (GAUZE/BANDAGES/DRESSINGS) ×1
BENZOIN TINCTURE PRP APPL 2/3 (GAUZE/BANDAGES/DRESSINGS) ×3 IMPLANT
BLADE SURG 15 STRL LF DISP TIS (BLADE) ×1 IMPLANT
BLADE SURG 15 STRL SS (BLADE) ×3
BRIEF STRETCH FOR OB PAD LRG (UNDERPADS AND DIAPERS) ×3 IMPLANT
COVER SURGICAL LIGHT HANDLE (MISCELLANEOUS) ×3 IMPLANT
DECANTER SPIKE VIAL GLASS SM (MISCELLANEOUS) ×3 IMPLANT
DRAPE LAPAROTOMY T 102X78X121 (DRAPES) ×3 IMPLANT
DRSG PAD ABDOMINAL 8X10 ST (GAUZE/BANDAGES/DRESSINGS) IMPLANT
ELECT PENCIL ROCKER SW 15FT (MISCELLANEOUS) ×3 IMPLANT
ELECT REM PT RETURN 9FT ADLT (ELECTROSURGICAL) ×3
ELECTRODE REM PT RTRN 9FT ADLT (ELECTROSURGICAL) ×1 IMPLANT
GAUZE SPONGE 4X4 12PLY STRL (GAUZE/BANDAGES/DRESSINGS) ×2 IMPLANT
GAUZE SPONGE 4X4 16PLY XRAY LF (GAUZE/BANDAGES/DRESSINGS) ×3 IMPLANT
GLOVE ECLIPSE 8.0 STRL XLNG CF (GLOVE) ×3 IMPLANT
GLOVE INDICATOR 8.0 STRL GRN (GLOVE) ×3 IMPLANT
GOWN STRL REUS W/TWL XL LVL3 (GOWN DISPOSABLE) ×6 IMPLANT
KIT BASIN OR (CUSTOM PROCEDURE TRAY) ×3 IMPLANT
LUBRICANT JELLY K Y 4OZ (MISCELLANEOUS) ×3 IMPLANT
NEEDLE HYPO 22GX1.5 SAFETY (NEEDLE) ×3 IMPLANT
PACK BASIC VI WITH GOWN DISP (CUSTOM PROCEDURE TRAY) ×3 IMPLANT
SCRUB PCMX 4 OZ (MISCELLANEOUS) ×3 IMPLANT
SHEARS HARMONIC 9CM CVD (BLADE) IMPLANT
SUT CHROMIC 2 0 SH (SUTURE) IMPLANT
SUT CHROMIC 3 0 SH 27 (SUTURE) ×2 IMPLANT
SUT VIC AB 2-0 SH 27 (SUTURE)
SUT VIC AB 2-0 SH 27X BRD (SUTURE) IMPLANT
SUT VIC AB 2-0 UR6 27 (SUTURE) IMPLANT
SYR 20CC LL (SYRINGE) ×3 IMPLANT
TOWEL OR 17X26 10 PK STRL BLUE (TOWEL DISPOSABLE) ×3 IMPLANT
TOWEL OR NON WOVEN STRL DISP B (DISPOSABLE) ×3 IMPLANT
YANKAUER SUCT BULB TIP 10FT TU (MISCELLANEOUS) ×3 IMPLANT

## 2015-12-14 NOTE — Anesthesia Procedure Notes (Signed)
Procedure Name: Intubation Date/Time: 12/14/2015 12:31 PM Performed by: Glory Buff Pre-anesthesia Checklist: Patient identified, Emergency Drugs available, Suction available and Patient being monitored Patient Re-evaluated:Patient Re-evaluated prior to inductionOxygen Delivery Method: Circle System Utilized Preoxygenation: Pre-oxygenation with 100% oxygen Intubation Type: IV induction Ventilation: Mask ventilation without difficulty Laryngoscope Size: Miller and 3 Grade View: Grade I Tube type: Oral Tube size: 7.0 mm Number of attempts: 1 Airway Equipment and Method: Stylet and Oral airway Placement Confirmation: ETT inserted through vocal cords under direct vision,  positive ETCO2 and breath sounds checked- equal and bilateral Secured at: 20 cm Tube secured with: Tape Dental Injury: Teeth and Oropharynx as per pre-operative assessment

## 2015-12-14 NOTE — Transfer of Care (Signed)
Immediate Anesthesia Transfer of Care Note  Patient: Margaret Henry  Procedure(s) Performed: Procedure(s): RECTAL EXAM UNDER ANESTHESIA REMOVAL OF ANAL CANAL MASS; INTERNAL HEMORRHOID LIGATION, EXTERNAL HEMORRHOID LIGATION (N/A)  Patient Location: PACU  Anesthesia Type:General  Level of Consciousness: awake, alert  and oriented  Airway & Oxygen Therapy: Patient Spontanous Breathing and Patient connected to face mask oxygen  Post-op Assessment: Report given to RN and Post -op Vital signs reviewed and stable  Post vital signs: Reviewed and stable  Last Vitals:  Filed Vitals:   12/14/15 1059  BP: 126/58  Pulse: 117  Temp: 36.5 C  Resp: 16    Complications: No apparent anesthesia complications

## 2015-12-14 NOTE — Anesthesia Postprocedure Evaluation (Signed)
Anesthesia Post Note  Patient: Margaret Henry  Procedure(s) Performed: Procedure(s) (LRB): RECTAL EXAM UNDER ANESTHESIA REMOVAL OF ANAL CANAL MASS; INTERNAL HEMORRHOID LIGATION, EXTERNAL HEMORRHOID LIGATION (N/A)  Patient location during evaluation: PACU Anesthesia Type: General Level of consciousness: awake and alert Pain management: pain level controlled Vital Signs Assessment: post-procedure vital signs reviewed and stable Respiratory status: spontaneous breathing, nonlabored ventilation, respiratory function stable and patient connected to nasal cannula oxygen Cardiovascular status: blood pressure returned to baseline and stable Postop Assessment: no signs of nausea or vomiting Anesthetic complications: no Comments: Doing well in PACU. Respiratory status at preop baseline. Doing well with incentive spirometer.    Last Vitals:  Filed Vitals:   12/14/15 1445 12/14/15 1543  BP: 117/61 127/65  Pulse: 92 100  Temp: 36.6 C 36.6 C  Resp: 16 16    Last Pain:  Filed Vitals:   12/14/15 1544  PainSc: 0-No pain                 Catalina Gravel

## 2015-12-14 NOTE — Anesthesia Preprocedure Evaluation (Addendum)
Anesthesia Evaluation  Patient identified by MRN, date of birth, ID band Patient awake    Reviewed: Allergy & Precautions, NPO status , Patient's Chart, lab work & pertinent test results  History of Anesthesia Complications (+) PROLONGED EMERGENCE and history of anesthetic complications  Airway Mallampati: II  TM Distance: >3 FB Neck ROM: Full    Dental  (+) Teeth Intact, Dental Advisory Given   Pulmonary shortness of breath and with exertion, COPD,  COPD inhaler, Current Smoker,    Pulmonary exam normal breath sounds clear to auscultation       Cardiovascular negative cardio ROS Normal cardiovascular exam Rhythm:Regular Rate:Normal     Neuro/Psych PSYCHIATRIC DISORDERS Depression negative neurological ROS     GI/Hepatic Neg liver ROS, GERD  Medicated,Anal polyp    Endo/Other  Benign thyroid goiter  Renal/GU negative Renal ROS     Musculoskeletal negative musculoskeletal ROS (+)   Abdominal   Peds  Hematology negative hematology ROS (+)   Anesthesia Other Findings Day of surgery medications reviewed with the patient.  Reproductive/Obstetrics negative OB ROS                          Anesthesia Physical Anesthesia Plan  ASA: III  Anesthesia Plan: General   Post-op Pain Management:    Induction: Intravenous  Airway Management Planned: Oral ETT  Additional Equipment:   Intra-op Plan:   Post-operative Plan: Extubation in OR  Informed Consent: I have reviewed the patients History and Physical, chart, labs and discussed the procedure including the risks, benefits and alternatives for the proposed anesthesia with the patient or authorized representative who has indicated his/her understanding and acceptance.   Dental advisory given  Plan Discussed with: CRNA and Anesthesiologist  Anesthesia Plan Comments: (Risks/benefits of general anesthesia discussed with patient including risk  of damage to teeth, lips, gum, and tongue, nausea/vomiting, allergic reactions to medications, and the possibility of heart attack, stroke and death.  All patient questions answered.  Patient wishes to proceed.)       Anesthesia Quick Evaluation

## 2015-12-14 NOTE — Discharge Instructions (Signed)
ANORECTAL SURGERY:  POST OPERATIVE INSTRUCTIONS  1. Take your usually prescribed home medications unless otherwise directed. 2. DIET: Follow a light bland diet the first 24 hours after arrival home, such as soup, liquids, crackers, etc.  Be sure to include lots of fluids daily.  Avoid fast food or heavy meals as your are more likely to get nauseated.  Eat a low fat the next few days after surgery.   3. PAIN CONTROL: a. Pain is best controlled by a usual combination of three different methods TOGETHER: i. Ice/Heat ii. Over the counter pain medication iii. Prescription pain medication b. Most patients will experience some swelling and discomfort in the anus/rectal area. and incisions.  Ice packs or heat (30-60 minutes up to 6 times a day) will help. Use ice for the first few days to help decrease swelling and bruising, then switch to heat such as warm towels, sitz baths, warm baths, etc to help relax tight/sore spots and speed recovery.  Some people prefer to use ice alone, heat alone, alternating between ice & heat.  Experiment to what works for you.  Swelling and bruising can take several weeks to resolve.   c. It is helpful to take an over-the-counter pain medication regularly for the first few weeks.  Choose one of the following that works best for you: i. Naproxen (Aleve, etc)  Two 232m tabs twice a day ii. Ibuprofen (Advil, etc) Three 2017mtabs four times a day (every meal & bedtime) iii. Acetaminophen (Tylenol, etc) 500-65058mour times a day (every meal & bedtime) d. A  prescription for pain medication (such as oxycodone, hydrocodone, etc) should be given to you upon discharge.  Take your pain medication as prescribed.  i. If you are having problems/concerns with the prescription medicine (does not control pain, nausea, vomiting, rash, itching, etc), please call us [phone] see if we need to switch you to a different pain medicine that will work better for you and/or control your  side effect better. ii. If you need a refill on your pain medication, please contact your pharmacy.  They will contact our office to request authorization. Prescriptions will not be filled after 5 pm or on week-ends.  Use a Sitz Bath 4-8 times a day for relief   [employer] bath is a warm water bath taken in the sitting position that covers only the hips and buttocks. It may be used for either healing or hygiene purposes. Sitz baths are also used to relieve pain, itching, or muscle spasms. The water may contain medicine. Moist heat will help you heal and relax.  HOME CARE INSTRUCTIONS  Take 3 to 4 sitz baths a day. 1. Fill the bathtub half full with warm water. 2. Sit in the water and open the drain a little. 3. Turn on the warm water to keep the tub half full. Keep the water running constantly. 4. Soak in the water for 15 to 20 minutes. 5. After the sitz bath, pat the affected area dry first.   4. KEEP YOUR BOWELS REGULAR a. The goal is one bowel movement a day b. Avoid getting constipated.  Between the surgery and the pain medications, it is common to experience some constipation.  Increasing fluid intake and taking a fiber supplement (such as Metamucil, Citrucel, FiberCon, MiraLax, etc) 1-2 times a day regularly will usually help prevent this problem from occurring.  A mild laxative (prune juice, Milk of Magnesia, MiraLax, etc) should be taken according to package  directions if there are no bowel movements after 48 hours. c. Watch out for diarrhea.  If you have many loose bowel movements, simplify your diet to bland foods & liquids for a few days.  Stop any stool softeners and decrease your fiber supplement.  Switching to mild anti-diarrheal medications (Kayopectate, Pepto Bismol) can help.  If this worsens or does not improve, please call us.  5. Wound Care  a. Remove your bandages the day after surgery.  Unless discharge instructions indicate otherwise, leave your bandage dry and in  place overnight.  Remove the bandage during your first bowel movement.   b. Wear an absorbent pad or soft cotton gauze in your underwear as needed to catch any drainage and help keep the area  c. Keep the area clean and dry.  Bathe / shower every day.  Keep the area clean by showering / bathing over the incision / wound.   It is okay to soak an open wound to help wash it.  Wet wipes or showers / gentle washing after bowel movements is often less traumatic than regular toilet paper. d. Dennis Bast will often notice bleeding with bowel movements.  This should slow down by the end of the first week of surgery e. Expect some drainage.  This should slow down, too, by the end of the first week of surgery.  Wear an absorbent pad or soft cotton gauze in your underwear until the drainage stops.  6. ACTIVITIES as tolerated:   a. You may resume regular (light) daily activities beginning the next day--such as daily self-care, walking, climbing stairs--gradually increasing activities as tolerated.  If you can walk 30 minutes without difficulty, it is safe to try more intense activity such as jogging, treadmill, bicycling, low-impact aerobics, swimming, etc. b. Save the most intensive and strenuous activity for last such as sit-ups, heavy lifting, contact sports, etc  Refrain from any heavy lifting or straining until you are off narcotics for pain control.   c. DO NOT PUSH THROUGH PAIN.  Let pain be your guide: If it hurts to do something, don't do it.  Pain is your body warning you to avoid that activity for another week until the pain goes down. d. You may drive when you are no longer taking prescription pain medication, you can comfortably sit for long periods of time, and you can safely maneuver your car and apply brakes. e. Dennis Bast may have sexual intercourse when it is comfortable.  7. FOLLOW UP in our office a. Please call [facility] at [phone] to set up an appointment to see your surgeon in the office for a follow-up  appointment approximately 2 weeks after your surgery. b. Make sure that you call for this appointment the day you arrive home to insure a convenient appointment time. 10. IF YOU HAVE DISABILITY OR FAMILY LEAVE FORMS, BRING THEM TO THE OFFICE FOR PROCESSING.  DO NOT GIVE THEM TO YOUR DOCTOR.        WHEN TO CALL us [phone]: 1. Poor pain control 2. Reactions / problems with new medications (rash/itching, nausea, etc)  3. Fever over 101.5 F (38.5 C) 4. Inability to urinate 5. Nausea and/or vomiting 6. Worsening swelling or bruising 7. Continued bleeding from incision. 8. Increased pain, redness, or drainage from the incision  The clinic staff is available to answer your questions during regular business hours (8:30am-5pm).  Please dont hesitate to call and ask to speak to one of our nurses for clinical concerns.   A  surgeon from [facility] is always on call at the hospitals   If you have a medical emergency, go to the nearest emergency room or call 911.    [facility], [address], [place], [place], [place]  [address] ? MAIN: [phone] ? TOLL FREE: [phone] ? FAX [phone] [email or web address]    GETTING TO GOOD BOWEL HEALTH. Irregular bowel habits such as constipation and diarrhea can lead to many problems over time.  Having one soft bowel movement a day is the most important way to prevent further problems.  The anorectal canal is designed to handle stretching and feces to safely manage our ability to get rid of solid waste (feces, poop, stool) out of our body.  BUT, hard constipated stools can act like ripping concrete bricks and diarrhea can be a burning fire to this very sensitive area of our body, causing inflamed hemorrhoids, anal fissures, increasing risk is perirectal abscesses, abdominal pain/bloating, an making irritable bowel worse.      The goal: ONE SOFT BOWEL MOVEMENT A DAY!  To have soft, regular  bowel movements:   Drink plenty of fluids, consider 4-6 tall glasses of water a day.    Take plenty of fiber.  Fiber is the undigested part of plant food that passes into the colon, acting s natures broom to encourage bowel motility and movement.  Fiber can absorb and hold large amounts of water. This results in a larger, bulkier stool, which is soft and easier to pass. Work gradually over several weeks up to 6 servings a day of fiber (25g a day even more if needed) in the form of: o Vegetables -- Root (potatoes, carrots, turnips), leafy green (lettuce, salad greens, celery, spinach), or cooked high residue (cabbage, broccoli, etc) o Fruit -- Fresh (unpeeled skin & pulp), Dried (prunes, apricots, cherries, etc ),  or stewed ( applesauce)  o Whole grain breads, pasta, etc (whole wheat)  o Bran cereals   Bulking Agents -- This type of water-retaining fiber generally is easily obtained each day by one of the following:  o Psyllium bran -- The psyllium plant is remarkable because its ground seeds can retain so much water. This product is available as Metamucil, Konsyl, Effersyllium, Per Diem Fiber, or the less expensive generic preparation in drug and health food stores. Although labeled a laxative, it really is not a laxative.  o Methylcellulose -- This is another fiber derived from wood which also retains water. It is available as Citrucel. o Polyethylene Glycol - and artificial fiber commonly called Miralax or Glycolax.  It is helpful for people with gassy or bloated feelings with regular fiber o Flax Seed - a less gassy fiber than psyllium  No reading or other relaxing activity while on the toilet. If bowel movements take longer than 5 minutes, you are too constipated  AVOID CONSTIPATION.  High fiber and water intake usually takes care of this.  Sometimes a laxative is needed to stimulate more frequent bowel movements, but   Laxatives are not a good long-term solution as it can wear the colon  out.  They can help jump-start bowels if constipated, but should be relied on constantly without discussing with your doctor o Osmotics (Milk of Magnesia, Fleets phosphosoda, Magnesium citrate, MiraLax, GoLytely) are safer than  o Stimulants (Senokot, Castor Oil, Dulcolax, Ex Lax)    o Avoid taking laxatives for more than 7 days in a row.   IF SEVERELY CONSTIPATED, try a Bowel Retraining  Program: o Do not use laxatives.  o Eat a diet high in roughage, such as bran cereals and leafy vegetables.  o Drink six (6) ounces of prune or apricot juice each morning.  o Eat two (2) large servings of stewed fruit each day.  o Take one (1) heaping tablespoon of a psyllium-based bulking agent twice a day. Use sugar-free sweetener when possible to avoid excessive calories.  o Eat a normal breakfast.  o Set aside 15 minutes after breakfast to sit on the toilet, but do not strain to have a bowel movement.  o If you do not have a bowel movement by the third day, use an enema and repeat the above steps.   Controlling diarrhea o Switch to liquids and simpler foods for a few days to avoid stressing your intestines further. o Avoid dairy products (especially milk & ice cream) for a short time.  The intestines often can lose the ability to digest lactose when stressed. o Avoid foods that cause gassiness or bloating.  Typical foods include beans and other legumes, cabbage, broccoli, and dairy foods.  Every person has some sensitivity to other foods, so listen to our body and avoid those foods that trigger problems for you. o Adding fiber (Citrucel, Metamucil, psyllium, Miralax) gradually can help thicken stools by absorbing excess fluid and retrain the intestines to act more normally.  Slowly increase the dose over a few weeks.  Too much fiber too soon can backfire and cause cramping & bloating. o Probiotics (such as active yogurt, Align, etc) may help repopulate the intestines and colon with normal bacteria and calm  down a sensitive digestive tract.  Most studies show it to be of mild help, though, and such products can be costly. o Medicines: - Bismuth subsalicylate (ex. Kayopectate, Pepto Bismol) every 30 minutes for up to 6 doses can help control diarrhea.  Avoid if pregnant. - Loperamide (Immodium) can slow down diarrhea.  Start with two tablets (4mg  total) first and then try one tablet every 6 hours.  Avoid if you are having fevers or severe pain.  If you are not better or start feeling worse, stop all medicines and call your doctor for advice o Call your doctor if you are getting worse or not better.  Sometimes further testing (cultures, endoscopy, X-ray studies, bloodwork, etc) may be needed to help diagnose and treat the cause of the diarrhea.  TROUBLESHOOTING IRREGULAR BOWELS 1) Avoid extremes of bowel movements (no bad constipation/diarrhea) 2) Miralax 17gm mixed in 8oz. water or juice-daily. May use BID as needed.  3) Gas-x,Phazyme, etc. as needed for gas & bloating.  4) Soft,bland diet. No spicy,greasy,fried foods.  5) Prilosec over-the-counter as needed  6) May hold gluten/wheat products from diet to see if symptoms improve.  7)  May try probiotics (Align, Activa, etc) to help calm the bowels down 7) If symptoms become worse call back immediately.  Managing Pain  Pain after surgery or related to activity is often due to strain/injury to muscle, tendon, nerves and/or incisions.  This pain is usually short-term and will improve in a few months.   Many people find it helpful to do the following things TOGETHER to help speed the process of healing and to get back to regular activity more quickly:  1. Avoid heavy physical activity at first a. No lifting greater than 20 pounds at first, then increase to lifting as tolerated over the next few weeks b. Do not push through the pain.  Listen to  your body and avoid positions and maneuvers than reproduce the pain.  Wait a few days before trying  something more intense c. Walking is okay as tolerated, but go slowly and stop when getting sore.  If you can walk 30 minutes without stopping or pain, you can try more intense activity (running, jogging, aerobics, cycling, swimming, treadmill, sex, sports, weightlifting, etc ) d. Remember: If it hurts to do it, then dont do it!  2. Take Anti-inflammatory medication a. Choose ONE of the following over-the-counter medications: i.            Acetaminophen 500mg  tabs (Tylenol) 1-2 pills with every meal and just before bedtime (avoid if you have liver problems) ii.            Naproxen 220mg  tabs (ex. Aleve) 1-2 pills twice a day (avoid if you have kidney, stomach, IBD, or bleeding problems) iii. Ibuprofen 200mg  tabs (ex. Advil, Motrin) 3-4 pills with every meal and just before bedtime (avoid if you have kidney, stomach, IBD, or bleeding problems) b. Take with food/snack around the clock for 1-2 weeks i. This helps the muscle and nerve tissues become less irritable and calm down faster  3. Use a Heating pad or Ice/Cold Pack a. 4-6 times a day b. May use warm bath/hottub  or showers  4. Try Gentle Massage and/or Stretching  a. at the area of pain many times a day b. stop if you feel pain - do not overdo it  Try these steps together to help you body heal faster and avoid making things get worse.  Doing just one of these things may not be enough.    If you are not getting better after two weeks or are noticing you are getting worse, contact our office for further advice; we may need to re-evaluate you & see what other things we can do to help.  Anal Intraepithelial Neoplasia (AIN) ANAL INTRAEPITHELIAL NEOPLASIA (AIN) Anal Intra-epithelial Neoplasia (AIN) is a pre-cancerous condition of the skin surrounding the anus. In the early stages of AIN there are abnormal skin cells (epithelial cells) in the outer third of the skin (epithelium). This is called AIN1 (also called a low-grade anal squamous  intra-epithelial lesion (LSIL). This can progress to AIN2, where the outer 2/3 of the skin contains abnormal cells, which in turn can result in AIN3 where there are abnormal cells involving the full thickness of skin (AIN2 and AIN3 are also called high-grade anal squamous intra-epithelial lesions (HSIL). The next step is for these abnormal cells to cross a barrier at the base of the skin called the basement membrane. Once this occurs, it is invasive cancer (carcinoma).  RISK FACTORS The risk factors for AIN are the same as for anal cancer, and include the immunosuppressed (eg HIV and transplant patients), and those with previous anal warts. PREVENTION The vaccine currently offered to women to reduce the risk of cervical cancer targets the Human Papilloma Virus (HPV) serotypes 16, 18, 6 and 11. Although not approved for this purpose, this vaccine may have a role for reducing the risk of AIN and anal cancer in high risk populations who do not yet have HPV infection. TREATMENT OF AIN Treatment is aimed at eradicating AIN and preventing anal cancer with minimal disturbance to anal function. There is currently no uniform treatment largely due to the uncertain natural history of AIN, the variable extent of disease, and the fact there is not a universally effective treatment modality.  TOPICAL AGENTS Imiquimod cream 5% (  Aldara ) can be applied to the area 3 times a day, and can be used for up to 16 weeks. It is an immunomodulatory agent and that attacks the virus responsible for warts, but also has anti-tumour effects [1]. There is some evidence that it not only slows the progression of AIN, but causes regression, with one study showing that 3/4 of men with AIN had their AIN completely cleared at the end of treatment[2]. It also has the added benefit in those with warts of causing regression of these, and in some cases eradication of the HPV virus. The main disadvantage is relapse in a quarter of cases after  cessation of therapy[1]. Side effects of imiquimod include erythema, flu?like illness and erosions, which are usually mild. It should not be applied prior to sexual intercourse. A systematic review showed a noncompliance rate of less than 5% because of side effects, mainly related to incompatibility with sexual life [3-4]. Topical 5-Fluorouracil 5% cream (Efudix) can be applied to the area 1-2 times a day, and the largest study showing a recurrence of high grade AIN in only 8% of patients when used for 9-12 weeks[5]. PHOTODYNAMIC THERAPY Photodynamic therapy (PDT) involves the application of a cream (topical sensitizer) such as 5-Flourouracil (Efudix) and subsequent exposure of the anal region to light and oxygen to generate oxygen intermediates that damage areas with AIN [6]. Although there is little evidence, there is a suggestion that early AIN responds to this treatment [7-8]. INFRARED PHOTOCOAGULATION Infrared photocoagulation is the same as photodynamic therapy except that it only uses light with a wavelength longer than visible light. Its use for AIN was first described by Verner Chol and colleagues [9], who showed that up to 2/3 of AIN can be cured and is effective in preventing progression to cancer.  SURGERY The treatment of widespread AIN3 is controversial. Most advocate surgery, however even with surgery there is a one third risk of recurrence [10-11]. If surgery is performed, it consists of either local excision or extensive excision with split skin grafting. The high recurrence rate with these procedures is thought to be related to ongoing HPV, multi?focal lesions that are not all treated, and a generalised field change. [place] of the high recurrence rate and extensive surgery required for widespread AIN3, many advocate just close surveillance with regular (3-6 monthly) biopsies, so that early invasive anal cancer can be picked up early and treated accordingly. RADIOTHERAPY Radiotherapy  has no role for AIN 3. Its only role is for confirmed anal cancer. FOLLOW-UP Follow?up should involve anoscopic examination, with or without the aid of high?resolution anoscopy. AIN 1 should be reviewed every 6?12 months with discharge from follow up upon resolution of AIN. AIN 3, especially in HIV?positive patients should be followed more closely every 3?6 months with or without treatment. The follow?up of AIN 2 is less clear, as the natural history of this condition is so uncertain. A regime somewhere between that of AIN 1 and 3 is advised, with HIV?positive or immunosuppressed patients being followed more like patients with AIN 3.

## 2015-12-14 NOTE — Op Note (Signed)
12/14/2015  1:17 PM  PATIENT:  Margaret Henry  65 y.o. 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  Liposomal bupivacaine (Experel) at the end of the case.  EBL:  Total I/O In: -  Out: 5 [Blood:5]  Delay start of Pharmacological VTE agent (>24hrs) due to surgical blood loss or risk of bleeding:  NO  DRAINS: NONE   Source of Specimen:  1.  Left anterior anal canal mass.  Question AIN 3.  2.  Right anterior external hemorrhoid with polyp  3.  Right posterior external hemorrhoid   DISPOSITION OF SPECIMEN:  PATHOLOGY  COUNTS:  YES  PLAN OF CARE: Discharge home after PACU  PATIENT DISPOSITION:  PACU - hemodynamically stable.  INDICATION: Pleasant patient with struggles with hemorrhoids.  Found to have anal canal polyp with fibrous plaque on it.  Biopsy consistent with anal intraepithelial neoplasia.  I recommended examination under anesthesia and surgical treatment:  The anatomy & physiology of the anorectal region was discussed.  The pathophysiology of hemorrhoids and differential diagnosis was discussed.  Natural history risks without surgery was discussed.   I stressed the importance of a bowel regimen to have daily soft bowel movements to  minimize progression of disease.  Interventions such as sclerotherapy & banding were discussed.  The patient's symptoms are not adequately controlled by medicines and other non-operative treatments.  I feel the risks & problems of no surgery outweigh the operative risks; therefore, I recommended surgery to treat the hemorrhoids by ligation, pexy, and possible resection.  Risks such as bleeding, infection, need for further treatment, heart attack, death, and other risks were discussed.   I noted a good likelihood this will help address the problem.  Goals of post-operative recovery were discussed as well.  Possibility that this will not correct all symptoms was explained.  Post-operative pain, bleeding, constipation, urinary difficulties, and other problems after surgery were discussed.  We will work to minimize complications.   Educational handouts further explaining the pathology, treatment options, and bowel regimen were given as well.  Questions were answered.  The patient expresses understanding & wishes to proceed with surgery.  OR FINDINGS:   Left anterior anal canal polyp with white scar at tip - suspicious for condyloma  Irritated external hemorrhoids on right ant/posterior  DESCRIPTION:   Informed consent was confirmed. Patient underwent general anesthesia without difficulty. Patient was placed into prone positioning.  The perianal region was prepped and draped in sterile fashion. Surgical time-out confirmed our plan.  I did digital rectal examination and then transitioned over to anoscopy to get a sense of the anatomy.  Findings noted above.   Patient had an obvious pedunculated polyp along the left anterior anal crypts.  Tip was white and fibrous suspicious for condyloma.  I excised it longitudinally.  I closed the wound with a 2-0 Vicryl running suture vertically.  Patient had external hemorrhoids and right anterior right posterior side.  Tip of the right anterior had some round  nodules on it.  Probably atypical hemorrhoid been excised those longitudinally.  Closed the external hemorrhoid wounds with 3-0 chromic suture.  She had rather redundant rectum with some irritated internal hemorrhoids.  I did ligate left lateral and right posterior piles with 2-0 Vicryl suture.  Did not do a running pexy.  I redid anoscopy.    At completion of this, all hemorrhoids were reduced into the rectum.  There is no prolapse. External anatomy looked normal.  I repeated anoscopy and examination.  Hemostasis was good.  Patient is being extubated go to go to the recovery room.  I had discussed postop care in detail with the patient in the preop holding area.  Instructions for post-operative recovery and prescriptions are written.  I am about to discuss the patient's status to the family.     Adin Hector, M.D., F.A.C.S. Gastrointestinal and Minimally Invasive Surgery [facility] [address] [place], [place] [address] [phone] Main / Paging

## 2015-12-14 NOTE — Progress Notes (Signed)
Patient may go to Short Stay with SA02 of 88 or >- per Dr. Gifford Shave

## 2015-12-14 NOTE — H&P (Signed)
Margaret Henry 11/07/2015 11:26 AM Location: [facility] Patient #: [id number] DOB: December 15, 1950 Single / Language: Margaret Henry / Race: White Female   History of Present Illness  Patient words: anal lesion.  The patient is a 65 year old female who presents with anal lesions. Note for "Anal lesions": Patient sent for surgical consultation by Dr. Zenovia Jarred with [facility] gastroenterology for concern of anal rectal lesion. Request for removal.  Pleasant smoking female with chronic COPD and bronchitis. Underwent screening colonoscopy. Small tubular adenoma removed from the cecum. Lesion seen in anal canal. 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    Diagnosis 1. Surgical [P], cecum, polyp - TUBULAR ADENOMA(X1). - HIGH GRADE DYSPLASIA IS NOT IDENTIFIED.  2. 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 Dr. Saralyn Pilar has reviewed this part and agree. Margaret Lanius MD Pathologist, Electronic Signature (Case signed 10/18/2015)   Patient struggles with occasional constipation but usually has a bowel movement about every day. Mild hemorrhoids but nothing too severe. She does not recall any history of warts or condyloma or sexual transmitted diseases. Cutback on her tobacco smoking became get below a half pack a day. Attacks. She comes today with her son. Claims she can walk and [employer] for about 20 minutes without difficulty. Not on oxygen.  No personal nor family history of GI/colon cancer, inflammatory bowel disease, irritable bowel syndrome, allergy such as Celiac Sprue, dietary/dairy problems, colitis, ulcers nor gastritis. No recent sick contacts/gastroenteritis. No travel outside the country. No changes in diet. No dysphagia to solids or liquids. No significant heartburn or reflux. No hematochezia, hematemesis, coffee  ground emesis. No evidence of prior gastric/peptic ulceration.   Other Problems Margaret Henry, CMA; 11/07/2015 11:26 AM) Chronic Obstructive Lung Disease Hemorrhoids Hypercholesterolemia Melanoma Oophorectomy Bilateral. Thyroid Disease  Past Surgical History Margaret Henry, CMA; 11/07/2015 11:26 AM) Colon Polyp Removal - Colonoscopy Hysterectomy (not due to cancer) - Complete Oral Surgery  Diagnostic Studies History Margaret Henry, CMA; 11/07/2015 11:26 AM) Colonoscopy within last year Mammogram within last year Pap Smear >5 years ago  Allergies Margaret Henry, CMA; 11/07/2015 11:28 AM) Hydrocodone-Acetaminophen *ANALGESICS - OPIOID* Propoxyphene Compound *ANALGESICS - OPIOID*  Medication History (Margaret Henry, CMA; 11/07/2015 11:28 AM) Advair Diskus (500-50MCG/DOSE Aero Pow Br Act, Inhalation) Active. Albuterol Sulfate ((2.5 MG/3ML)0.083% Nebulized Soln, Inhalation) Active. Atorvastatin Calcium (40MG  Tablet, Oral) Active. Ventolin HFA (108 (90 Base)MCG/ACT Aerosol Soln, Inhalation) Active. Spiriva HandiHaler (18MCG Capsule, Inhalation) Active. RaNITidine HCl (150MG  Tablet, Oral) Active. Medications Reconciled  Social History Margaret Henry, CMA; 11/07/2015 11:26 AM) Alcohol use Occasional alcohol use. Caffeine use Coffee, Tea. No drug use Tobacco use Current every day smoker.  Family History Margaret Henry, [place]; 11/07/2015 11:26 AM) Alcohol Abuse Mother. Heart Disease Father, Mother. Heart disease in female family member before age 39 Heart disease in female family member before age 45 Respiratory Condition Father.  Pregnancy / Birth History Margaret Henry, [place]; 11/07/2015 11:26 AM) Age at menarche 21 years. Age of menopause <44 Contraceptive History Intrauterine device, Oral contraceptives. Gravida 3 Maternal age <15 Para 3    Review of Systems ([place]; 11/07/2015 11:26 AM) Skin Not Present- Change in Wart/Mole, Dryness, Hives,  Jaundice, New Lesions, Non-Healing Wounds, Rash and Ulcer. HEENT Present- Seasonal Allergies and Wears glasses/contact lenses. Not Present- Earache, Hearing Loss, Hoarseness, Nose Bleed, Oral Ulcers, Ringing in the Ears, Sinus Pain, Sore Throat, Visual Disturbances and Yellow Eyes. Respiratory Present- Chronic  Cough and Difficulty Breathing. Not Present- Bloody sputum, Snoring and Wheezing. Breast Not Present- Breast Mass, Breast Pain, Nipple Discharge and Skin Changes. Cardiovascular Present- Shortness of Breath. Not Present- Chest Pain, Difficulty Breathing Lying Down, Leg Cramps, Palpitations, Rapid Heart Rate and Swelling of Extremities. Gastrointestinal Present- Bloating, Gets full quickly at meals and Hemorrhoids. Not Present- Abdominal Pain, Bloody Stool, Change in Bowel Habits, Chronic diarrhea, Constipation, Difficulty Swallowing, Excessive gas, Indigestion, Nausea, Rectal Pain and Vomiting. Female Genitourinary Not Present- Frequency, Nocturia, Painful Urination, Pelvic Pain and Urgency. Musculoskeletal Not Present- Back Pain, Joint Pain, Joint Stiffness, Muscle Pain, Muscle Weakness and Swelling of Extremities. Neurological Not Present- Decreased Memory, Fainting, Headaches, Numbness, Seizures, Tingling, Tremor, Trouble walking and Weakness. Psychiatric Not Present- Anxiety, Bipolar, Change in Sleep Pattern, Depression, Fearful and Frequent crying. Endocrine Present- Cold Intolerance. Not Present- Excessive Hunger, Hair Changes, Heat Intolerance, Hot flashes and New Diabetes. Hematology Present- Easy Bruising. Not Present- Excessive bleeding, Gland problems, HIV and Persistent Infections.  Vitals (Margaret Henry CMA; 11/07/2015 11:27 AM) 11/07/2015 11:27 AM Weight: 112 lb Height: 64in Body Surface Area: 1.53 m Body Mass Index: 19.22 kg/m  Pulse: 74 (Regular)  BP: 124/80 (Sitting, Left Arm, Standard)       Physical Exam Adin Hector MD; 11/07/2015 12:18  PM) General Mental Status-Alert. General Appearance-Not in acute distress, Not Sickly. Orientation-Oriented X3. Hydration-Well hydrated. Voice-Normal. Note: Older than stated age   Integumentary Global Assessment Upon inspection and palpation of skin surfaces of the - Axillae: non-tender, no inflammation or ulceration, no drainage. and Distribution of scalp and body hair is normal. General Characteristics Temperature - normal warmth is noted.  Head and Neck Head-normocephalic, atraumatic with no lesions or palpable masses. Face Global Assessment - atraumatic, no absence of expression. Neck Global Assessment - no abnormal movements, no bruit auscultated on the right, no bruit auscultated on the left, no decreased range of motion, non-tender. Trachea-midline. Thyroid Gland Characteristics - non-tender.  Eye Eyeball - Left-Extraocular movements intact, No Nystagmus. Eyeball - Right-Extraocular movements intact, No Nystagmus. Cornea - Left-No Hazy. Cornea - Right-No Hazy. Sclera/Conjunctiva - Left-No scleral icterus, No Discharge. Sclera/Conjunctiva - Right-No scleral icterus, No Discharge. Pupil - Left-Direct reaction to light normal. Pupil - Right-Direct reaction to light normal.  ENMT Ears Pinna - Left - no drainage observed, no generalized tenderness observed. Right - no drainage observed, no generalized tenderness observed. Nose and Sinuses External Inspection of the Nose - no destructive lesion observed. Inspection of the nares - Left - quiet respiration. Right - quiet respiration. Mouth and Throat Lips - Upper Lip - no fissures observed, no pallor noted. Lower Lip - no fissures observed, no pallor noted. Nasopharynx - no discharge present. Oral Cavity/Oropharynx - Tongue - no dryness observed. Oral Mucosa - no cyanosis observed. Hypopharynx - no evidence of airway distress observed.  Chest and Lung Exam Inspection Movements - Normal and  Symmetrical. Accessory muscles - No use of accessory muscles in breathing. Palpation Palpation of the chest reveals - Non-tender. Auscultation Breath sounds - Normal and Clear.  Cardiovascular Auscultation Rhythm - Regular. Murmurs & Other Heart Sounds - Auscultation of the heart reveals - No Murmurs and No Systolic Clicks.  Abdomen Inspection Inspection of the abdomen reveals - No Visible peristalsis and No Abnormal pulsations. Umbilicus - No Bleeding, No Urine drainage. Palpation/Percussion Palpation and Percussion of the abdomen reveal - Soft, Non Tender, No Rebound tenderness, No Rigidity (guarding) and No Cutaneous hyperesthesia. Note: Soft and flat. Low midline incision. No hernias. No  pain or discomfort.   Female Genitourinary Sexual Maturity Tanner 5 - Adult hair pattern. Note: No inguinal hernias. No lymphadenopathy. No vaginal bleeding nor discharge   Rectal Note: 1 cm pedunculated polyp LEFT anterior anal canal, 1 cm from anal verge. Tip with some mild white plaque consistent with verrucous change     Exam done with assistance of female Medical Assistant in the room.  Perianal skin clean with good hygiene. No pruritis ani. No pilonidal disease. Prominent round coccyx tip. No fissure. No abscess/fistula.  Normal sphincter tone. Tolerates digital and anoscopic rectal exam. Grade 2 internal hemorrhoids with somewhat floppy rectum but no true prolapse. No proctitis.   Peripheral Vascular Upper Extremity Inspection - Left - No Cyanotic nailbeds, Not Ischemic. Right - No Cyanotic nailbeds, Not Ischemic.  Neurologic Neurologic evaluation reveals -normal attention span and ability to concentrate, able to name objects and repeat phrases. Appropriate fund of knowledge , normal sensation and normal coordination. Mental Status Affect - not angry, not paranoid. Cranial Nerves-Normal Bilaterally. Gait-Normal.  Neuropsychiatric Mental status exam performed with  findings of-able to articulate well with normal speech/language, rate, volume and coherence, thought content normal with ability to perform basic computations and apply abstract reasoning and no evidence of hallucinations, delusions, obsessions or homicidal/suicidal ideation.  Musculoskeletal Global Assessment Spine, Ribs and Pelvis - no instability, subluxation or laxity. Right Upper Extremity - no instability, subluxation or laxity.  Lymphatic Head & Neck  General Head & Neck Lymphatics: Bilateral - Description - No Localized lymphadenopathy. Axillary  General Axillary Region: Bilateral - Description - No Localized lymphadenopathy. Femoral & Inguinal  Generalized Femoral & Inguinal Lymphatics: Left - Description - No Localized lymphadenopathy. Right - Description - No Localized lymphadenopathy.   Results Adin Hector MD; 11/07/2015 12:29 PM) Procedures  Name Value Date ANOSCOPY, DIAGNOSTIC [phone]) [ Hemorrhoids ] Procedure Internal exam: Mass Other: Please see rectal section of physical exam. LEFT anterior anal canal pedunculated mass Consistent with atypical polyp.  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 NO history of prior warts/condyloma  I think this warrants removal. Would do with outpatient surgery to get good margins and make sure there are no other abnormalities. Possible hemorrhoid ligation if there are concerns. Patient & son agree.   Given her COPD, may try and do decubitus with some sedation/MAC. However ideally would have the patient in prone positioning as tolerated given the anterior location.  No new issues - ready for surgery  Current Plans You are being scheduled for surgery - Our schedulers will call you.  You should hear from our office's scheduling department within 5 working days about the location, date, and  time of surgery. We try to make accommodations for patient's preferences in scheduling surgery, but sometimes the OR schedule or the surgeon's schedule prevents [place] from making those accommodations.  If you have not heard from our office [phone]) in 5 working days, call the office and ask for your surgeon's nurse.  If you have other questions about your diagnosis, plan, or surgery, call the office and ask for your surgeon's nurse.  The anatomy & physiology of the anorectal region was discussed. The pathophysiology of anorectal warts and differential diagnosis was discussed. Natural history risks without surgery was discussed such as further growth and cancer. I stressed the importance of office follow-up to catch early recurrence & minimize/halt progression of disease. Interventions  such as cauterization or cryotherapy by topical agents were discussed.  The patient's symptoms are not adequately controlled by non-operative treatments. I feel the risks & problems of no surgery outweigh the operative risks; therefore, I recommended surgery to treat the anal warts by removal, ablation and/or cauterization.  Risks such as bleeding, infection, need for further treatment, heart attack, death, and other risks were discussed. I noted a good likelihood this will help address the problem. Goals of post-operative recovery were discussed as well. Possibility that this will not correct all symptoms was explained. Post-operative pain, bleeding, constipation, and other problems after surgery were discussed. We will work to minimize complications. Educational handouts further explaining the pathology, treatment options, and bowel regimen were given as well. Questions were answered. The patient expresses understanding & wishes to proceed with surgery.  Pt Education - [facility] Anal Warts (Wylie Russon) Pt Education - [facility] Rectal Prep for Anorectal outpatient/office surgery: discussed with patient and provided information. Pt  Education - [facility] Rectal Surgery HCI (Ashea Winiarski): discussed with patient and provided information. Pt Education - [facility] Pelvic Floor Exercises (Keg[place]) and Dysfunction HCI (Bret Stamour) Pt Education - [place] (Marrisa Kimber) ANOSCOPY, DIAGNOSTIC [id number])  Adin Hector, M.D., F.A.C.S. Gastrointestinal and Minimally Invasive Surgery [facility] [address] [place],  [address] [phone] Main / Paging

## 2015-12-16 MED FILL — oxyCODONE HCL 5 MG TABS: 5 | 5 days supply | Qty: 40 | Fill #0

## 2015-12-16 MED FILL — ATORVASTATIN 40 MG TABLET: 40 | 30 days supply | Qty: 30 | Fill #2

## 2015-12-16 MED FILL — SPIRIVA 18 MCG CP-HANDIHALE: 18 | 30 days supply | Qty: 30 | Fill #4

## 2015-12-19 MED FILL — CIPROFLOXACIN HCL 500 MG TA: 500 | 5 days supply | Qty: 10 | Fill #0

## 2015-12-22 ENCOUNTER — Other Ambulatory Visit: Payer: Self-pay | Admitting: Family Medicine

## 2015-12-22 MED FILL — VENTOLIN HFA 90 MCG INHALER: 108 (90 BAS | 30 days supply | Qty: 18 | Fill #3

## 2015-12-23 MED FILL — ALBUTEROL 0.083% INHAL SOLN: (2.5 MG/3ML | 6 days supply | Qty: 75 | Fill #0

## 2015-12-28 ENCOUNTER — Other Ambulatory Visit: Payer: Self-pay | Admitting: Acute Care

## 2015-12-28 DIAGNOSIS — F17210 Nicotine dependence, cigarettes, uncomplicated: Secondary | ICD-10-CM

## 2016-01-05 MED FILL — ADVAIR 500/50 DISKUS: 500-50 | 30 days supply | Qty: 60 | Fill #2

## 2016-01-13 ENCOUNTER — Other Ambulatory Visit: Payer: Self-pay | Admitting: Family Medicine

## 2016-01-13 MED FILL — SPIRIVA 18 MCG CP-HANDIHALE: 18 | 30 days supply | Qty: 30 | Fill #5

## 2016-01-13 MED FILL — ALBUTEROL 0.083% INHAL SOLN: (2.5 MG/3ML | 6 days supply | Qty: 75 | Fill #0

## 2016-01-16 MED FILL — VENTOLIN HFA 90 MCG INHALER: 108 (90 BAS | 25 days supply | Qty: 18 | Fill #0

## 2016-01-19 ENCOUNTER — Encounter: Payer: Self-pay | Admitting: Pulmonary Disease

## 2016-01-19 ENCOUNTER — Ambulatory Visit (INDEPENDENT_AMBULATORY_CARE_PROVIDER_SITE_OTHER): Payer: Commercial Managed Care - HMO | Admitting: Pulmonary Disease

## 2016-01-19 VITALS — BP 128/68 | HR 93 | Temp 98.1°F | Ht 64.0 in | Wt 111.0 lb

## 2016-01-19 DIAGNOSIS — R0600 Dyspnea, unspecified: Secondary | ICD-10-CM

## 2016-01-19 DIAGNOSIS — F17210 Nicotine dependence, cigarettes, uncomplicated: Secondary | ICD-10-CM | POA: Diagnosis not present

## 2016-01-19 DIAGNOSIS — J439 Emphysema, unspecified: Secondary | ICD-10-CM | POA: Diagnosis not present

## 2016-01-19 NOTE — Progress Notes (Signed)
Subjective:    Patient ID: ALICEMAE ROED, female    DOB: Aug 03, 1951, 65 y.o.   MRN: [id number]  HPI Follow up for COPD.  Mrs. Yolanda Bonine is a 65 year old with history of COPD diagnosed 3 years ago. Her symptoms consist mainly of shortness of breath, daily cough with whitish sputum production, wheeze, dyspnea and exertion. She does not have any hemoptysis, chest pain, palpitations. No fevers, chills.  She is currently maintained on Spiriva and Advair. She states that this helps with her symptoms. She had temporarily been on Spiriva for a couple of months earlier this year because she could not afford it. At that time her cough increases. She has since restarted Spiriva with improvement in symptoms. She uses her albuterol rescue inhaler about 3-4 times a day.  She is a current smoker. She had smoked one pack per day for nearly 25 years. She is trying to quit and is down to 1 pack every week. She had used the nicotine patch in the past. 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 She does not recall any particular exposures at work or at home.  Patient Active Problem List   Diagnosis Date Noted  . Coronary arteriosclerosis 09/26/2015  . Unspecified vitamin D deficiency 03/02/2010  . ALLERGIC RHINITIS 03/02/2010  . BACK PAIN, LUMBAR 03/02/2010  . TROCHANTERIC BURSITIS, BILATERAL 03/02/2010  . THYROID NODULE, RIGHT 11/09/2009  . DEPRESSION 11/09/2009  . Hyperlipidemia 02/25/2009  . GERD 02/25/2009  . OSTEOPOROSIS 01/30/2009  . COMPRESSION FRACTURE, SPINE 01/21/2009  . TOBACCO ABUSE 09/07/2008  . COPD (chronic obstructive pulmonary disease) ([place]) 09/07/2008  . Goiter, unspecified 10/08/1997    Current outpatient prescriptions:  .  acetaminophen (TYLENOL) 500 MG tablet, Take 1,500 mg by mouth every 4 (four) hours as needed. For headache, Disp: , Rfl:  .  albuterol (PROVENTIL) (2.5 MG/3ML) 0.083% nebulizer solution, USE 1 VIAL IN NEBULIZER EVERY 6 HOURS AS NEEDED FOR  WHEEZING OR SHORTNESS OR BREATH, Disp: 75 mL, Rfl: 2 .  atorvastatin (LIPITOR) 40 MG tablet, Take 1 tablet (40 mg total) by mouth daily., Disp: 30 tablet, Rfl: 5 .  Fluticasone-Salmeterol (ADVAIR DISKUS) 500-50 MCG/DOSE AEPB, Inhale 1 puff into the lungs 2 (two) times daily., Disp: 60 each, Rfl: 5 .  nicotine (NICODERM CQ - DOSED IN MG/24 HOURS) 21 mg/24hr patch, Place 21 mg onto the skin daily., Disp: , Rfl:  .  tiotropium (SPIRIVA HANDIHALER) 18 MCG inhalation capsule, Place 1 capsule (18 mcg total) into inhaler and inhale daily., Disp: 30 capsule, Rfl: 2 .  VENTOLIN HFA 108 (90 BASE) MCG/ACT inhaler, INHALE 2 PUFFS INTO THE LUNGS EVERY 6 HOURS AS NEEDED FOR WHEEZING OR SHORTNESS OF BREATH., Disp: 18 g, Rfl: 2 .  Oxymetazoline HCl (SINEX LONG-ACTING NA), Place 2 sprays into the nose daily. Reported on 01/19/2016, Disp: , Rfl:  .  [DISCONTINUED] simvastatin (ZOCOR) 80 MG tablet, Take 80 mg by mouth at bedtime., Disp: , Rfl:    DATA: CXR (05/06/15) Hyperinflation of the lungs suggesting chronic obstructive pulmonary disease. No acute cardiopulmonary abnormality seen.  CT 11/21 Rads2 category  PFTs (07/21/15) FVC 2.06 66%) FEV1 0.93 (39%) F/F 45 DLCO 36% Unable complete pleth.   Review of Systems Complains of daily cough, productive with white sputum, dyspnea on exertion and at rest, wheezing. No chest pain, palpitations.  No nausea, vomiting, diarrhea. All other review of systems negative.    Objective:   Physical Exam  Blood pressure 128/68, pulse 93,  temperature 98.1 F (36.7 C), temperature source Oral, height 5\' 4"  (1.626 m), weight 111 lb (50.349 kg), SpO2 93 %. Gen.: Pleasant female. No apparent distress Neuro: No gross focal deficits. Neck: No JVD, lymphadenopathy, thyromegaly. RS: Clear, No wheeze or crackles. CVS: S1-S2 heard, no murmurs rubs gallops. Abdomen: Soft, positive bowel sounds. Extremities: No edema    Assessment & Plan:  #1 Severe COPD,Gold Stage III,  Chronic bronchitis She seems to be doing okay on the current inhalers but still has some dyspnea on exertion. Swill continue the Advair and Spiriva. She seems to have benefited from the increase in advair dose at last visit.  #2 Current smoker. Smoking cessation was discussed with her. She wants to try to quit on her using the nicotine patch. She's already been successful cutting down considerably on the number of cigarettes. She did not tolerate the Chantix due to diarrhea. `  Plan: - Continue spiriva, advair - Smoking cessation. - Continue lung cancer screening.   Marshell Garfinkel MD [facility] Pager [phone] If no answer or after 3pm call: [phone] 01/19/2016, 2:16 PM

## 2016-01-19 NOTE — Patient Instructions (Signed)
Continue your inhalers as prescribed.  Return to clinic in 6 months.

## 2016-02-02 ENCOUNTER — Other Ambulatory Visit: Payer: Self-pay | Admitting: Internal Medicine

## 2016-02-02 MED FILL — ADVAIR 500/50 DISKUS: 500-50 | 30 days supply | Qty: 60 | Fill #3

## 2016-02-02 MED FILL — ALBUTEROL 0.083% INHAL SOLN: (2.5 MG/3ML | 6 days supply | Qty: 75 | Fill #1

## 2016-02-02 MED FILL — ATORVASTATIN 40 MG TABLET: 40 | 30 days supply | Qty: 30 | Fill #3

## 2016-02-06 MED FILL — VENTOLIN HFA 90 MCG INHALER: 108 (90 BAS | 25 days supply | Qty: 18 | Fill #1

## 2016-02-14 MED FILL — ALBUTEROL 0.083% INHAL SOLN: (2.5 MG/3ML | 6 days supply | Qty: 75 | Fill #2

## 2016-02-14 MED FILL — SPIRIVA 18 MCG CP-HANDIHALE: 18 | 30 days supply | Qty: 30 | Fill #0

## 2016-02-27 ENCOUNTER — Telehealth: Payer: Self-pay | Admitting: Family Medicine

## 2016-02-27 NOTE — Telephone Encounter (Signed)
Pt. Called requesting for her PCP to prescribe an Rx for her allergy medications:  Claritin  Nasal spray  Pt. Nurse, adult pharmacy in [place]. Please f/u with pt.

## 2016-02-27 NOTE — Telephone Encounter (Signed)
Pt need OV for Rx  Call transfer to [facility] office for appointment

## 2016-03-01 MED FILL — VENTOLIN HFA 90 MCG INHALER: 108 (90 BAS | 25 days supply | Qty: 18 | Fill #2

## 2016-03-08 ENCOUNTER — Other Ambulatory Visit: Payer: Self-pay | Admitting: Family Medicine

## 2016-03-08 MED FILL — ALBUTEROL 0.083% INHAL SOLN: (2.5 MG/3ML | 6 days supply | Qty: 75 | Fill #0

## 2016-03-12 MED FILL — ADVAIR 500/50 DISKUS: 500-50 | 30 days supply | Qty: 60 | Fill #4

## 2016-03-12 MED FILL — SPIRIVA 18 MCG CP-HANDIHALE: 18 | 30 days supply | Qty: 30 | Fill #1

## 2016-03-19 DIAGNOSIS — D013 Carcinoma in situ of anus and anal canal: Secondary | ICD-10-CM | POA: Diagnosis not present

## 2016-03-19 DIAGNOSIS — K644 Residual hemorrhoidal skin tags: Secondary | ICD-10-CM | POA: Diagnosis not present

## 2016-03-23 ENCOUNTER — Encounter: Payer: Self-pay | Admitting: Family Medicine

## 2016-03-23 ENCOUNTER — Ambulatory Visit: Payer: Commercial Managed Care - HMO | Attending: Family Medicine | Admitting: Family Medicine

## 2016-03-23 VITALS — BP 128/65 | HR 92 | Temp 97.8°F | Resp 16 | Ht 64.0 in | Wt 112.0 lb

## 2016-03-23 DIAGNOSIS — F17200 Nicotine dependence, unspecified, uncomplicated: Secondary | ICD-10-CM

## 2016-03-23 DIAGNOSIS — E785 Hyperlipidemia, unspecified: Secondary | ICD-10-CM

## 2016-03-23 DIAGNOSIS — I2510 Atherosclerotic heart disease of native coronary artery without angina pectoris: Secondary | ICD-10-CM | POA: Insufficient documentation

## 2016-03-23 DIAGNOSIS — Z79899 Other long term (current) drug therapy: Secondary | ICD-10-CM | POA: Diagnosis not present

## 2016-03-23 DIAGNOSIS — J42 Unspecified chronic bronchitis: Secondary | ICD-10-CM | POA: Diagnosis not present

## 2016-03-23 DIAGNOSIS — F17210 Nicotine dependence, cigarettes, uncomplicated: Secondary | ICD-10-CM | POA: Insufficient documentation

## 2016-03-23 DIAGNOSIS — J069 Acute upper respiratory infection, unspecified: Secondary | ICD-10-CM

## 2016-03-23 MED ORDER — AMOXICILLIN-POT CLAVULANATE 875-125 MG PO TABS: 1.0000 | ORAL_TABLET | Freq: Two times a day (BID) | ORAL | Status: DC

## 2016-03-23 MED ORDER — SPIRIVA HANDIHALER 18 MCG IN CAPS: 18.0000 ug | ORAL_CAPSULE | Freq: Every day | RESPIRATORY_TRACT | Status: DC

## 2016-03-23 MED ORDER — ALBUTEROL SULFATE HFA 108 (90 BASE) MCG/ACT IN AERS: 2.0000 | INHALATION_SPRAY | RESPIRATORY_TRACT | Status: DC | PRN

## 2016-03-23 MED ORDER — ATORVASTATIN CALCIUM 40 MG PO TABS: 40.0000 mg | ORAL_TABLET | Freq: Every day | ORAL | Status: DC

## 2016-03-23 MED ORDER — ALBUTEROL SULFATE (2.5 MG/3ML) 0.083% IN NEBU: 2.5000 mg | INHALATION_SOLUTION | RESPIRATORY_TRACT | Status: DC | PRN

## 2016-03-23 MED ORDER — FLUTICASONE-SALMETEROL 500-50 MCG/DOSE IN AEPB: 1.0000 | INHALATION_SPRAY | Freq: Two times a day (BID) | RESPIRATORY_TRACT | Status: DC

## 2016-03-23 MED ORDER — PREDNISONE 20 MG PO TABS: 40.0000 mg | ORAL_TABLET | Freq: Every day | ORAL | Status: DC

## 2016-03-23 MED ORDER — NICOTINE 14 MG/24HR TD PT24: 14.0000 mg | MEDICATED_PATCH | Freq: Every day | TRANSDERMAL | Status: DC

## 2016-03-23 MED FILL — ALBUTEROL 0.083% INHAL SOLN: (2.5 MG/3ML | 4 days supply | Qty: 75 | Fill #0

## 2016-03-23 MED FILL — predniSONE 20 MG TABS: 20 | 4 days supply | Qty: 8 | Fill #0

## 2016-03-23 MED FILL — ATORVASTATIN 40 MG TABLET: 40 | 90 days supply | Qty: 90 | Fill #0

## 2016-03-23 MED FILL — VENTOLIN HFA 90 MCG INHALER: 108 (90 BAS | 17 days supply | Qty: 18 | Fill #0

## 2016-03-23 MED FILL — AMOX-CLAV 875-125 MG TABLET: 875-125 | 10 days supply | Qty: 20 | Fill #0

## 2016-03-23 NOTE — Progress Notes (Signed)
C/C season allergies [facility]  Medicine refills  Referral pulmonology  No pain today  Tobacco 1pp week No suicidal thought in the past two weeks

## 2016-03-23 NOTE — Patient Instructions (Addendum)
Margaret Henry was seen today for medication refill, allergies and referral.  Diagnoses and all orders for this visit:  Acute upper respiratory infection -     amoxicillin-clavulanate (AUGMENTIN) 875-125 MG tablet; Take 1 tablet by mouth 2 (two) times daily. -     predniSONE (DELTASONE) 20 MG tablet; Take 2 tablets (40 mg total) by mouth daily with breakfast.  Chronic bronchitis, unspecified chronic bronchitis type ([place]) -     Ambulatory referral to Pulmonology -     [place] 18 MCG inhalation capsule; Place 1 capsule (18 mcg total) into inhaler and inhale daily. -     albuterol (PROVENTIL) (2.5 MG/3ML) 0.083% nebulizer solution; Take 3 mLs (2.5 mg total) by nebulization every 4 (four) hours as needed for wheezing or shortness of breath. -     albuterol (VENTOLIN HFA) 108 (90 Base) MCG/ACT inhaler; Inhale 2 puffs into the lungs every 4 (four) hours as needed for wheezing or shortness of breath. -     Fluticasone-Salmeterol (ADVAIR DISKUS) 500-50 MCG/DOSE AEPB; Inhale 1 puff into the lungs 2 (two) times daily.  Hyperlipidemia -     atorvastatin (LIPITOR) 40 MG tablet; Take 1 tablet (40 mg total) by mouth daily. -     Lipid Panel; Future -     COMPLETE METABOLIC PANEL WITH GFR; Future  Coronary arteriosclerosis -     atorvastatin (LIPITOR) 40 MG tablet; Take 1 tablet (40 mg total) by mouth daily.  TOBACCO ABUSE -     nicotine (NICODERM CQ) 14 mg/24hr patch; Place 1 patch (14 mg total) onto the skin daily.   Smoking cessation support: smoking cessation hotline: 1-800-QUIT-NOW.  Smoking cessation classes are available through [facility] and [facility]. Call [phone] or visit our website at [email or web address]   F/u in for labs next week  F/u OV in 3 months for COPD, sooner if needed  Dr. Adrian Blackwater

## 2016-03-23 NOTE — Progress Notes (Signed)
Subjective:  Patient ID: Margaret Henry, female    DOB: 11-27-1950  Age: 65 y.o. MRN: [id number]  CC: Medication Refill; Allergies; and Referral   HPI Margaret Henry presents for    1. COPD: she reports increased cough over the past month. Cough is productive and worse in the morning. No CP or SOB. No fever. She continues to smoke. She request pulmonology referral and medication refills.   2. CAD: taking lipitor. No chest pain. Has increased cough as noted above. No swelling, dizziness or lightheadedness.    Social History  Substance Use Topics  . Smoking status: Light Tobacco Smoker -- 0.25 packs/day for 49 years    Types: Cigarettes  . Smokeless tobacco: Never Used     Comment: smokes 5 cigs a day -not using vapors / trying to quit on her own for 6 months, then will use meds if unable to quit on her own.  . Alcohol Use: No   Outpatient Prescriptions Prior to Visit  Medication Sig Dispense Refill  . acetaminophen (TYLENOL) 500 MG tablet Take 1,500 mg by mouth every 4 (four) hours as needed. For headache    . albuterol (PROVENTIL) (2.5 MG/3ML) 0.083% nebulizer solution USE 1 VIAL IN NEBULIZER EVERY 6 HOURS AS NEEDED FOR WHEEZING OR SHORTNESS OR BREATH 75 mL 2  . atorvastatin (LIPITOR) 40 MG tablet Take 1 tablet (40 mg total) by mouth daily. 30 tablet 5  . Fluticasone-Salmeterol (ADVAIR DISKUS) 500-50 MCG/DOSE AEPB Inhale 1 puff into the lungs 2 (two) times daily. 60 each 5  . Oxymetazoline HCl (SINEX LONG-ACTING NA) Place 2 sprays into the nose daily. Reported on 01/19/2016    . SPIRIVA HANDIHALER 18 MCG inhalation capsule PLACE 1 CAPSULE INTO INHALER AND INHALE DAILY. 30 capsule 3  . VENTOLIN HFA 108 (90 BASE) MCG/ACT inhaler INHALE 2 PUFFS INTO THE LUNGS EVERY 6 HOURS AS NEEDED FOR WHEEZING OR SHORTNESS OF BREATH. 18 g 2  . nicotine (NICODERM CQ - DOSED IN MG/24 HOURS) 21 mg/24hr patch Place 21 mg onto the skin daily. Reported on 03/23/2016     No facility-administered medications  prior to visit.    ROS Review of Systems  Constitutional: Negative for fever and chills.  Eyes: Negative for visual disturbance.  Respiratory: Positive for cough (productive first thing in AM ) and shortness of breath.   Cardiovascular: Negative for chest pain.  Gastrointestinal: Negative for nausea, vomiting, abdominal pain, diarrhea, constipation and blood in stool.  Endocrine: Negative for cold intolerance.  Musculoskeletal: Negative for back pain and arthralgias.  Skin: Negative for rash.  Allergic/Immunologic: Negative for immunocompromised state.  Hematological: Negative for adenopathy. Does not bruise/bleed easily.  Psychiatric/Behavioral: Negative for suicidal ideas and dysphoric mood.    Objective:  BP 128/65 mmHg  Pulse 92  Temp(Src) 97.8 F (36.6 C) (Oral)  Resp 16  Ht 5\' 4"  (1.626 m)  Wt 112 lb (50.803 kg)  BMI 19.22 kg/m2  SpO2 94%   BP/Weight 03/26/2016 03/23/2016 123456  Systolic BP [id number]  Diastolic BP 62 65 68  Wt. (Lbs) 111.8 112 111  BMI 19.18 19.22 19.04   Physical Exam  Constitutional: She is oriented to person, place, and time. She appears well-developed and well-nourished. No distress.  HENT:  Head: Normocephalic and atraumatic.  Nose: Mucosal edema present. Right sinus exhibits no maxillary sinus tenderness and no frontal sinus tenderness. Left sinus exhibits no maxillary sinus tenderness and no frontal sinus tenderness.  Cardiovascular: Normal rate, regular rhythm, normal  heart sounds and intact distal pulses.   Pulmonary/Chest: Effort normal and breath sounds normal.  Musculoskeletal: She exhibits no edema.  Neurological: She is alert and oriented to person, place, and time.  Skin: Skin is warm and dry. No rash noted.  Psychiatric: She has a normal mood and affect.    Assessment & Plan:   There are no diagnoses linked to this encounter. Alisha was seen today for medication refill, allergies and referral.  Diagnoses and all orders  for this visit:  Acute upper respiratory infection -     amoxicillin-clavulanate (AUGMENTIN) 875-125 MG tablet; Take 1 tablet by mouth 2 (two) times daily. -     predniSONE (DELTASONE) 20 MG tablet; Take 2 tablets (40 mg total) by mouth daily with breakfast.  Chronic bronchitis, unspecified chronic bronchitis type ([place]) -     Ambulatory referral to Pulmonology -     [place] 18 MCG inhalation capsule; Place 1 capsule (18 mcg total) into inhaler and inhale daily. -     albuterol (PROVENTIL) (2.5 MG/3ML) 0.083% nebulizer solution; Take 3 mLs (2.5 mg total) by nebulization every 4 (four) hours as needed for wheezing or shortness of breath. -     albuterol (VENTOLIN HFA) 108 (90 Base) MCG/ACT inhaler; Inhale 2 puffs into the lungs every 4 (four) hours as needed for wheezing or shortness of breath. -     Fluticasone-Salmeterol (ADVAIR DISKUS) 500-50 MCG/DOSE AEPB; Inhale 1 puff into the lungs 2 (two) times daily.  Hyperlipidemia -     atorvastatin (LIPITOR) 40 MG tablet; Take 1 tablet (40 mg total) by mouth daily. -     Lipid Panel; Future -     COMPLETE METABOLIC PANEL WITH GFR; Future  Coronary arteriosclerosis -     atorvastatin (LIPITOR) 40 MG tablet; Take 1 tablet (40 mg total) by mouth daily.  TOBACCO ABUSE -     nicotine (NICODERM CQ) 14 mg/24hr patch; Place 1 patch (14 mg total) onto the skin daily.   Meds ordered this encounter  Medications  . SPIRIVA HANDIHALER 18 MCG inhalation capsule    Sig: Place 1 capsule (18 mcg total) into inhaler and inhale daily.    Dispense:  30 capsule    Refill:  5  . albuterol (PROVENTIL) (2.5 MG/3ML) 0.083% nebulizer solution    Sig: Take 3 mLs (2.5 mg total) by nebulization every 4 (four) hours as needed for wheezing or shortness of breath.    Dispense:  75 mL    Refill:  2  . albuterol (VENTOLIN HFA) 108 (90 Base) MCG/ACT inhaler    Sig: Inhale 2 puffs into the lungs every 4 (four) hours as needed for wheezing or shortness of breath.     Dispense:  18 g    Refill:  2  . atorvastatin (LIPITOR) 40 MG tablet    Sig: Take 1 tablet (40 mg total) by mouth daily.    Dispense:  90 tablet    Refill:  3  . Fluticasone-Salmeterol (ADVAIR DISKUS) 500-50 MCG/DOSE AEPB    Sig: Inhale 1 puff into the lungs 2 (two) times daily.    Dispense:  60 each    Refill:  5  . amoxicillin-clavulanate (AUGMENTIN) 875-125 MG tablet    Sig: Take 1 tablet by mouth 2 (two) times daily.    Dispense:  20 tablet    Refill:  0  . predniSONE (DELTASONE) 20 MG tablet    Sig: Take 2 tablets (40 mg total) by mouth daily with breakfast.  Dispense:  8 tablet    Refill:  0  . nicotine (NICODERM CQ) 14 mg/24hr patch    Sig: Place 1 patch (14 mg total) onto the skin daily.    Dispense:  28 patch    Refill:  0    Follow-up: Return in about 3 months (around 06/23/2016) for COPD .   Boykin Nearing MD

## 2016-03-26 ENCOUNTER — Ambulatory Visit: Payer: Commercial Managed Care - HMO | Attending: Family Medicine

## 2016-03-26 ENCOUNTER — Encounter: Payer: Self-pay | Admitting: Acute Care

## 2016-03-26 ENCOUNTER — Ambulatory Visit (INDEPENDENT_AMBULATORY_CARE_PROVIDER_SITE_OTHER)
Admission: RE | Admit: 2016-03-26 | Discharge: 2016-03-26 | Disposition: A | Discharge status: Home / Self Care | Payer: Commercial Managed Care - HMO | Source: Ambulatory Visit | Attending: Acute Care | Admitting: Acute Care

## 2016-03-26 ENCOUNTER — Ambulatory Visit (INDEPENDENT_AMBULATORY_CARE_PROVIDER_SITE_OTHER): Payer: Commercial Managed Care - HMO | Admitting: Acute Care

## 2016-03-26 ENCOUNTER — Telehealth: Payer: Self-pay | Admitting: Acute Care

## 2016-03-26 VITALS — BP 140/62 | HR 113 | Ht 64.0 in | Wt 111.8 lb

## 2016-03-26 DIAGNOSIS — E785 Hyperlipidemia, unspecified: Secondary | ICD-10-CM | POA: Insufficient documentation

## 2016-03-26 DIAGNOSIS — J309 Allergic rhinitis, unspecified: Secondary | ICD-10-CM | POA: Diagnosis not present

## 2016-03-26 DIAGNOSIS — J42 Unspecified chronic bronchitis: Secondary | ICD-10-CM

## 2016-03-26 DIAGNOSIS — J069 Acute upper respiratory infection, unspecified: Secondary | ICD-10-CM | POA: Diagnosis not present

## 2016-03-26 LAB — COMPLETE METABOLIC PANEL WITH GFR
ALBUMIN: 4.7 g/dL (ref 3.6–5.1)
ALK PHOS: 56 U/L (ref 33–130)
ALT: 9 U/L (ref 6–29)
AST: 12 U/L (ref 10–35)
BILIRUBIN TOTAL: 0.4 mg/dL (ref 0.2–1.2)
BUN: 11 mg/dL (ref 7–25)
CALCIUM: 9.8 mg/dL (ref 8.6–10.4)
CO2: 27 mmol/L (ref 20–31)
CREATININE: 0.66 mg/dL (ref 0.50–0.99)
Chloride: 108 mmol/L (ref 98–110)
Glucose, Bld: 90 mg/dL (ref 65–99)
Potassium: 3.9 mmol/L (ref 3.5–5.3)
Sodium: 143 mmol/L (ref 135–146)
TOTAL PROTEIN: 7.3 g/dL (ref 6.1–8.1)

## 2016-03-26 LAB — LIPID PANEL
CHOLESTEROL: 164 mg/dL (ref 125–200)
HDL: 62 mg/dL (ref >=46)
LDL Cholesterol: 61 mg/dL (ref <130)
TRIGLYCERIDES: 207 mg/dL — AB (ref <150)
Total CHOL/HDL Ratio: 2.6 Ratio (ref <=5.0)
VLDL: 41 mg/dL — ABNORMAL HIGH (ref <30)

## 2016-03-26 MED ORDER — MOMETASONE FUROATE 50 MCG/ACT NA SUSP: 2.0000 | Freq: Every day | NASAL | Status: DC

## 2016-03-26 MED ORDER — LORATADINE 10 MG PO TABS: 10.0000 mg | ORAL_TABLET | Freq: Every day | ORAL | Status: DC

## 2016-03-26 MED FILL — LORATADINE 10 MG TABLET: 10 | 100 days supply | Qty: 100 | Fill #0

## 2016-03-26 NOTE — Patient Instructions (Addendum)
Continue your prednisone taper as prescribed by your PCP on Friday. Continue your Augmentin as prescribed. Continue using your Albuterol Nebs as needed for shortness of breath up to every 4-6 hours. Continue your Spiriva, Advair and Ventolin We will send in a prescription for Claritin 10 mg daily.today We will send in a prescription for the nasonex nasal spray, 2 puffs in each nare daily. We will get a CXR today and call you with results. We will walk you today to see if you are desaturating. We will do Spirometry in at follow up appointment. Follow up appointment in 2 weeks to make sure you are improving. Please continue to work on quitting smoking. This is the single most powerful action you can take to improve your pulmonary function. Please contact office for sooner follow up if symptoms do not improve or worsen or seek emergency care

## 2016-03-26 NOTE — Assessment & Plan Note (Signed)
Seasonal Rhinitis contributing to COPD Flare Treatment with Augmentin and Prednisone started Friday 03/23/16. Plan: We will send in a prescription for Claritin 10 mg daily.today We will send in a prescription for the nasonex nasal spray, 2 puffs in each nare daily. Follow up in 2 weeks to ensure continued  Improvement. Please contact office for sooner follow up if symptoms do not improve or worsen or seek emergency care

## 2016-03-26 NOTE — Telephone Encounter (Signed)
Called and patient advised me that she is scheduled to see Judson Roch on 04/12/16.  Patient will need Spirometry done at next [place] per Sarah. Patient aware. Nothing further needed.

## 2016-03-26 NOTE — Patient Instructions (Signed)
Patient will receive FU call regarding results.

## 2016-03-26 NOTE — Progress Notes (Signed)
History of Present Illness Margaret Henry is a 65 y.o. female with COPD and seasonal rhinitis followed by Dr. Vaughan Browner   6/19/2017Acute OV:  Pt. Presents to the office for worsening dyspnea. She is currently being treated for an URI by her PCP with prednisone taper and Augmentin.These were both started Friday 03/23/2016.Marland KitchenShe is finding that the heat is making her shortness of breath worse as she goes in and out of the air conditioning.She continues to smoke 3 cigarettes daily.She states she is using her nebulizer and Proventil inhaler more than she should be using them.She continues to cough up yellow brown secretions.She states she has had fever, she continues to sleep on 2 pillows, she denies hemoptysis, leg or calf pain.  Tests Walk today in office indicates drop to 90% with walking. She did not drop below 90%.  Past medical hx Past Medical History  Diagnosis Date  . COPD (chronic obstructive pulmonary disease) ([place])   . Bronchitis   . High cholesterol   . Allergy     hayfever  . GERD (gastroesophageal reflux disease)     occasional  . Osteoporosis   . Thyroid goiter     bx benign  . Cancer ([place])     skin cancer on chest  . Complication of anesthesia     pt states was given too much during nasal surgery 1989; difficulty getting awake  . Shortness of breath dyspnea     with exertion   . Pneumonia   . Vertigo   . Colon polyps   . Diverticulitis   . Hemorrhoids      Past surgical hx, Family hx, Social hx all reviewed.  Current Outpatient Prescriptions on File Prior to Visit  Medication Sig  . acetaminophen (TYLENOL) 500 MG tablet Take 1,500 mg by mouth every 4 (four) hours as needed. For headache  . albuterol (PROVENTIL) (2.5 MG/3ML) 0.083% nebulizer solution Take 3 mLs (2.5 mg total) by nebulization every 4 (four) hours as needed for wheezing or shortness of breath.  Marland Kitchen albuterol (VENTOLIN HFA) 108 (90 Base) MCG/ACT inhaler Inhale 2 puffs into the lungs every 4 (four)  hours as needed for wheezing or shortness of breath.  Marland Kitchen amoxicillin-clavulanate (AUGMENTIN) 875-125 MG tablet Take 1 tablet by mouth 2 (two) times daily.  Marland Kitchen atorvastatin (LIPITOR) 40 MG tablet Take 1 tablet (40 mg total) by mouth daily.  . Fluticasone-Salmeterol (ADVAIR DISKUS) 500-50 MCG/DOSE AEPB Inhale 1 puff into the lungs 2 (two) times daily.  . nicotine (NICODERM CQ) 14 mg/24hr patch Place 1 patch (14 mg total) onto the skin daily.  . Oxymetazoline HCl (SINEX LONG-ACTING NA) Place 2 sprays into the nose daily. Reported on 01/19/2016  . predniSONE (DELTASONE) 20 MG tablet Take 2 tablets (40 mg total) by mouth daily with breakfast.  . SPIRIVA HANDIHALER 18 MCG inhalation capsule Place 1 capsule (18 mcg total) into inhaler and inhale daily.  . [DISCONTINUED] simvastatin (ZOCOR) 80 MG tablet Take 80 mg by mouth at bedtime.   No current facility-administered medications on file prior to visit.     Allergies  Allergen Reactions  . Hydrocodone Nausea And Vomiting  . Propoxyphene N-Acetaminophen Nausea And Vomiting    Review Of Systems:  Constitutional:   No  weight loss, night sweats, + Fevers, chills, fatigue, or  lassitude.  HEENT:   No headaches,  Difficulty swallowing,  Tooth/dental problems, or  Sore throat,                No  sneezing, itching, ear ache, nasal congestion, post nasal drip,   CV:  No chest pain,  Orthopnea, PND, swelling in lower extremities, anasarca, dizziness, palpitations, syncope.   GI  No heartburn, indigestion, abdominal pain, nausea, vomiting, diarrhea, change in bowel habits, loss of appetite, bloody stools.   Resp: + shortness of breath with exertion not  at rest.  + excess mucus, + productive cough,  No non-productive cough,  No coughing up of blood.  + change in color of mucus.  + wheezing.  No chest wall deformity  Skin: no rash or lesions.  GU: no dysuria, change in color of urine, no urgency or frequency.  No flank pain, no hematuria   MS:  No  joint pain or swelling.  No decreased range of motion.  No back pain.  Psych:  No change in mood or affect. No depression or anxiety.  No memory loss.   Vital Signs BP 140/62 mmHg  Pulse 113  Ht 5\' 4"  (1.626 m)  Wt 111 lb 12.8 oz (50.712 kg)  BMI 19.18 kg/m2  SpO2 94%   Physical Exam:  General- No distress,  A&Ox3, thin frail female ENT: No sinus tenderness, TM clear, pale nasal mucosa, no oral exudate,no post nasal drip, no LAN Cardiac: S1, S2, regular rate and rhythm, no murmur Chest: + wheeze/ no ales/ dullness; no accessory muscle use, no nasal flaring, no sternal retractions Abd.: Soft Non-tender Ext: No clubbing cyanosis, edema Neuro:  normal strength Skin: No rashes, warm and dry Psych: normal mood and behavior   Assessment/Plan  COPD (chronic obstructive pulmonary disease) COPD Flare triggered by URI Continues to smoke 3 cigarettes daily, but is working hard to quit. Plan: Continue your prednisone taper as prescribed by your PCP on Friday 03/23/2016. Continue your Augmentin as prescribed on Friday 03/23/2016. Continue using your Albuterol Nebs as needed for shortness of breath up to every 4-6 hours. Continue your Spiriva, Advair and Ventolin We will send in a prescription for Claritin 10 mg daily.today We will send in a prescription for the nasonex nasal spray, 2 puffs in each nare daily. We will get a CXR today and call you with results. We will walk you today to see if you are desaturating. We will do Spirometry at your next visit once your treatment with antibiotics and prednisone is complete.. Follow up appointment in 2 weeks to make sure you are improving. Please continue to work on quitting smoking. This is the single most powerful action you can take to improve your pulmonary function. Please contact office for sooner follow up if symptoms do not improve or worsen or seek emergency care    Allergic rhinitis Seasonal Rhinitis contributing to COPD  Flare Treatment with Augmentin and Prednisone started Friday 03/23/16. Plan: We will send in a prescription for Claritin 10 mg daily.today We will send in a prescription for the nasonex nasal spray, 2 puffs in each nare daily. Follow up in 2 weeks to ensure continued  Improvement. Please contact office for sooner follow up if symptoms do not improve or worsen or seek emergency care        Magdalen Spatz, NP 03/26/2016  12:38 PM

## 2016-03-26 NOTE — Telephone Encounter (Signed)
Appts w/SG available on 04/12/16  LMTCB

## 2016-03-26 NOTE — Assessment & Plan Note (Addendum)
COPD Flare triggered by URI Continues to smoke 3 cigarettes daily, but is working hard to quit. Plan: Continue your prednisone taper as prescribed by your PCP on Friday 03/23/2016. Continue your Augmentin as prescribed on Friday 03/23/2016. Continue using your Albuterol Nebs as needed for shortness of breath up to every 4-6 hours. Continue your Spiriva, Advair and Ventolin We will send in a prescription for Claritin 10 mg daily.today We will send in a prescription for the nasonex nasal spray, 2 puffs in each nare daily. We will get a CXR today and call you with results. We will walk you today to see if you are desaturating. We will do Spirometry at your next visit once your treatment with antibiotics and prednisone is complete.. Follow up appointment in 2 weeks to make sure you are improving. Please continue to work on quitting smoking. This is the single most powerful action you can take to improve your pulmonary function. Please contact office for sooner follow up if symptoms do not improve or worsen or seek emergency care

## 2016-03-26 NOTE — Assessment & Plan Note (Signed)
Course of prednisone and Augmentin for increase cough and and congestion consistent with upper respiratory illness

## 2016-03-28 ENCOUNTER — Telehealth: Payer: Self-pay | Admitting: Acute Care

## 2016-03-28 MED ORDER — FLUTICASONE PROPIONATE 50 MCG/ACT NA SUSP: 2.0000 | Freq: Every day | NASAL | Status: DC

## 2016-03-28 MED FILL — FLUTICASONE PROP 50 MCG SPR: 50 | 30 days supply | Qty: 16 | Fill #0

## 2016-03-28 NOTE — Telephone Encounter (Signed)
Called pt to make aware of recs.  rx sent to pharmacy, med list updated. Pt is requesting cxr results.   sg please advise.  Thanks!

## 2016-03-28 NOTE — Telephone Encounter (Signed)
Pt's insurance will not cover Nasonex. They cover Flonase or Astelin.  SG - please advise. Thanks.

## 2016-03-28 NOTE — Telephone Encounter (Signed)
Please send in a prescription for Flonase 2 puffs in each nare once daily. Thanks so much.

## 2016-04-02 NOTE — Telephone Encounter (Signed)
Sarah, please advise regarding CXR results. Thanks.  CLINICAL DATA: Recent upper respiratory infection.  EXAM: CHEST 2 VIEW  COMPARISON: CT 08/29/2015. Chest x-ray 05/06/2015.  FINDINGS: Hyperexpansion is consistent with emphysema. Interstitial markings are diffusely coarsened with chronic features. The lungs are clear wiithout focal pneumonia, edema, pneumothorax or pleural effusion. The cardiopericardial silhouette is within normal limits for size. The visualized bony structures of the thorax are intact.  IMPRESSION: Emphysema with chronic interstitial coarsening. No acute cardiopulmonary findings.   Electronically Signed  By: Misty Stanley M.D.  On: 03/26/2016 12:32

## 2016-04-03 NOTE — Telephone Encounter (Signed)
The results of the CXR done 03/26/2016 have been called to the patient.I explained that there was no pneumonia or edema. I told her it did indicate emphysema, which she was already aware of. She verbalized understanding and had no further questions at the completion of the call.

## 2016-04-12 ENCOUNTER — Encounter: Payer: Self-pay | Admitting: Acute Care

## 2016-04-12 ENCOUNTER — Ambulatory Visit (INDEPENDENT_AMBULATORY_CARE_PROVIDER_SITE_OTHER): Payer: Commercial Managed Care - HMO | Admitting: Acute Care

## 2016-04-12 VITALS — BP 98/64 | HR 109 | Ht 64.0 in | Wt 112.4 lb

## 2016-04-12 DIAGNOSIS — J309 Allergic rhinitis, unspecified: Secondary | ICD-10-CM

## 2016-04-12 DIAGNOSIS — J42 Unspecified chronic bronchitis: Secondary | ICD-10-CM

## 2016-04-12 MED ORDER — LORATADINE 10 MG PO TABS: 10.0000 mg | ORAL_TABLET | Freq: Every day | ORAL | Status: DC

## 2016-04-12 MED ORDER — RANITIDINE HCL 150 MG PO TABS: 150.0000 mg | ORAL_TABLET | Freq: Every day | ORAL | Status: DC

## 2016-04-12 MED FILL — VENTOLIN HFA 90 MCG INHALER: 108 (90 BAS | 17 days supply | Qty: 18 | Fill #1

## 2016-04-12 MED FILL — raNITIdine HCL 150 MG TABS: 150 | 30 days supply | Qty: 30 | Fill #0

## 2016-04-12 MED FILL — SPIRIVA 18 MCG CP-HANDIHALE: 18 | 30 days supply | Qty: 30 | Fill #2

## 2016-04-12 NOTE — Assessment & Plan Note (Addendum)
Improvement in allergy symptoms with addition of flonase and claritin daily. Plan: Continue flonase  2 puffs in each nostril once daily. Continue Claritin 10 mg once daily. Follow up in 3 months with Dr. Vaughan Browner..

## 2016-04-12 NOTE — Assessment & Plan Note (Signed)
Resolving COPD Exacerbation: Plan: We will check into getting you a portable nebulizer machine through your DME. Continue using your Albuterol Nebs as needed for shortness of breath up to every 4-6 hours. Continue your Spiriva, Advair and Ventolin. We will refill these today. We will send in a prescription for Claritin 10 mg daily.today Continue  the nasonex nasal spray, 2 puffs in each nare daily. Your Spirometry shows some progression of your COPD. Please continue to work on quitting smoking. Continue wearing  your nicoderm patch  We will prescribe  zantac 150 mg daily, take 30 minutes before dinner. Generic is ok. This is the single most powerful action you can take to improve your pulmonary function. Follow up in 3 months or as needed. Remember to get a flu shot in the fall.  Please contact office for sooner follow up if symptoms do not improve or worsen or seek emergency care

## 2016-04-12 NOTE — Progress Notes (Signed)
History of Present Illness Margaret Henry is a 65 y.o. female with COPD and seasonal rhinitis followed by Dr. Vaughan Browner.   7/6/2017Follow Up Appointment Pt. Presents to the office today for follow up. She states that her cough is better, but not completely gone. Cough remains productive with white, thick secretions. She states she still has dyspnea, which is worse when outside. She is using masks when she goes outside. She denies fever, chest pain, orthopnea, or hemoptysis. Spirometry done today indicates progression of her COPD. She is currently using her nebulizer treatments once daily if staying home and twice daily if going out. She is compliant with her Advair,Spiriva, Proventil. She is using the Flonase and Claritin we added to her regimen at the last visit.She completed the prednisone taper and Augmentin. She states that her cough is worse when laying down. We will add zantac. She continues to smoke 3-4 cigarettes daily but is wearing a 21 mg nicoderm patch and is working hard on quitting.We did walk the patient in the office last visit and she did not desaturate below 90%.  Tests Spirometry( Reviewed with Dr. Melvyn Novas) /03/2016: Very Severe Obstruction with low vital capacity FVC: 1.79/ 57% FEV1 : 0.65 / 27% F/F: 36%/ 47% VC: 3.16  Past medical hx Past Medical History  Diagnosis Date  . COPD (chronic obstructive pulmonary disease) ([place])   . Bronchitis   . High cholesterol   . Allergy     hayfever  . GERD (gastroesophageal reflux disease)     occasional  . Osteoporosis   . Thyroid goiter     bx benign  . Cancer ([place])     skin cancer on chest  . Complication of anesthesia     pt states was given too much during nasal surgery 1989; difficulty getting awake  . Shortness of breath dyspnea     with exertion   . Pneumonia   . Vertigo   . Colon polyps   . Diverticulitis   . Hemorrhoids      Past surgical hx, Family hx, Social hx all reviewed.  Current Outpatient Prescriptions  on File Prior to Visit  Medication Sig  . acetaminophen (TYLENOL) 500 MG tablet Take 1,500 mg by mouth every 4 (four) hours as needed. For headache  . albuterol (PROVENTIL) (2.5 MG/3ML) 0.083% nebulizer solution Take 3 mLs (2.5 mg total) by nebulization every 4 (four) hours as needed for wheezing or shortness of breath.  Marland Kitchen albuterol (VENTOLIN HFA) 108 (90 Base) MCG/ACT inhaler Inhale 2 puffs into the lungs every 4 (four) hours as needed for wheezing or shortness of breath.  Marland Kitchen atorvastatin (LIPITOR) 40 MG tablet Take 1 tablet (40 mg total) by mouth daily.  . fluticasone (FLONASE) 50 MCG/ACT nasal spray Place 2 sprays into both nostrils daily.  . Fluticasone-Salmeterol (ADVAIR DISKUS) 500-50 MCG/DOSE AEPB Inhale 1 puff into the lungs 2 (two) times daily.  Marland Kitchen loratadine (CLARITIN) 10 MG tablet Take 1 tablet (10 mg total) by mouth daily.  . nicotine (NICODERM CQ) 14 mg/24hr patch Place 1 patch (14 mg total) onto the skin daily.  . Oxymetazoline HCl (SINEX LONG-ACTING NA) Place 2 sprays into the nose daily. Reported on 01/19/2016  . SPIRIVA HANDIHALER 18 MCG inhalation capsule Place 1 capsule (18 mcg total) into inhaler and inhale daily.  . [DISCONTINUED] simvastatin (ZOCOR) 80 MG tablet Take 80 mg by mouth at bedtime.   No current facility-administered medications on file prior to visit.     Allergies  Allergen Reactions  .  Hydrocodone Nausea And Vomiting  . Propoxyphene N-Acetaminophen Nausea And Vomiting    Review Of Systems:  Constitutional:   No  weight loss, night sweats,  Fevers, chills, fatigue, or  lassitude.  HEENT:   No headaches,  Difficulty swallowing,  Tooth/dental problems, or  Sore throat,                No sneezing, itching, ear ache, nasal congestion, post nasal drip,   CV:  No chest pain,  Orthopnea, PND, swelling in lower extremities, anasarca, dizziness, palpitations, syncope.   GI  No heartburn, indigestion, abdominal pain, nausea, vomiting, diarrhea, change in bowel  habits, loss of appetite, bloody stools.   Resp: +shortness of breath with exertion lessrest.  +excess mucus, +productive cough,  No non-productive cough,  No coughing up of blood.  No change in color of mucus.  No wheezing.  No chest wall deformity  Skin: no rash or lesions.  GU: no dysuria, change in color of urine, no urgency or frequency.  No flank pain, no hematuria   MS:  No joint pain or swelling.  No decreased range of motion.  No back pain.  Psych:  No change in mood or affect. No depression or anxiety.  No memory loss.   Vital Signs BP 98/64 mmHg  Pulse 109  Ht 5\' 4"  (1.626 m)  Wt 112 lb 6.4 oz (50.984 kg)  BMI 19.28 kg/m2  SpO2 95%   Physical Exam:  General- No distress,  A&Ox3, frail female ENT: No sinus tenderness, TM clear, pale nasal mucosa, no oral exudate,no post nasal drip, no LAN Cardiac: S1, S2, regular rate and rhythm, no murmur Chest: No wheeze/ rales/ dullness;diminished per bases bilaterally, no nasal flaring, or sternal retractions Abd.: Soft Non-tender Ext: No clubbing cyanosis, edema Neuro:  normal strength Skin: No rashes, warm and dry Psych: normal mood and behavior   Assessment/Plan  COPD (chronic obstructive pulmonary disease) Resolving COPD Exacerbation: Plan: We will check into getting you a portable nebulizer machine through your DME. Continue using your Albuterol Nebs as needed for shortness of breath up to every 4-6 hours. Continue your Spiriva, Advair and Ventolin. We will refill these today. We will send in a prescription for Claritin 10 mg daily.today Continue  the nasonex nasal spray, 2 puffs in each nare daily. Your Spirometry shows some progression of your COPD. Please continue to work on quitting smoking. Continue wearing  your nicoderm patch  We will prescribe  zantac 150 mg daily, take 30 minutes before dinner. Generic is ok. This is the single most powerful action you can take to improve your pulmonary function. Follow up  in 3 months or as needed. Remember to get a flu shot in the fall.  Please contact office for sooner follow up if symptoms do not improve or worsen or seek emergency care   Allergic rhinitis Improvement in allergy symptoms with addition of flonase and claritin daily. Plan: Continue flonase  2 puffs in each nostril once daily. Continue Claritin 10 mg once daily. Follow up in 3 months with Dr. Vaughan Browner..      Magdalen Spatz, NP 04/12/2016  11:36 AM

## 2016-04-12 NOTE — Patient Instructions (Addendum)
I am glad your cough is better. We will check into getting you a portable nebulizer machine through your DME. Continue using your Albuterol Nebs as needed for shortness of breath up to every 4-6 hours. Continue your Spiriva, Advair and Ventolin. We will refill these today. Add zantac 150 mg 30 minutes prior to dinner. We will send in a prescription for Claritin 10 mg daily.today Continue  the nasonex nasal spray, 2 puffs in each nare daily. Your Spirometry shows some progression of your COPD. Please continue to work on quitting smoking. Continue wearing  your nicoderm patch  We will prescribe  zantac 150 mg daily, take 30 minutes after  dinner. Generic is ok. This is the single most powerful action you can take to improve your pulmonary function. Follow up in 3 months or as needed. Remember to get a flu shot in the fall.  Continue flonase  2 puffs in each nostril once daily. Continue Claritin 10 mg once daily. Follow up in 3 months with Dr. Vaughan Browner. Please contact office for sooner follow up if symptoms do not improve or worsen or seek emergency care

## 2016-04-13 MED FILL — ALBUTEROL 0.083% INHAL SOLN: (2.5 MG/3ML | 4 days supply | Qty: 75 | Fill #1

## 2016-04-23 MED FILL — ADVAIR 500/50 DISKUS: 500-50 | 30 days supply | Qty: 60 | Fill #5

## 2016-05-03 MED FILL — ALBUTEROL 0.083% INHAL SOLN: (2.5 MG/3ML | 4 days supply | Qty: 75 | Fill #2

## 2016-05-03 MED FILL — VENTOLIN HFA 90 MCG INHALER: 108 (90 BAS | 17 days supply | Qty: 18 | Fill #2

## 2016-05-14 MED FILL — SPIRIVA 18 MCG CP-HANDIHALE: 18 | 30 days supply | Qty: 30 | Fill #3

## 2016-05-14 MED FILL — raNITIdine HCL 150 MG TABS: 150 | 30 days supply | Qty: 30 | Fill #1

## 2016-05-21 ENCOUNTER — Other Ambulatory Visit: Payer: Self-pay | Admitting: Family Medicine

## 2016-05-21 DIAGNOSIS — J42 Unspecified chronic bronchitis: Secondary | ICD-10-CM

## 2016-05-21 MED FILL — ADVAIR 500/50 DISKUS: 500-50 | 30 days supply | Qty: 60 | Fill #0

## 2016-05-21 MED FILL — FLUTICASONE PROP 50 MCG SPR: 50 | 30 days supply | Qty: 16 | Fill #1

## 2016-05-21 MED FILL — VENTOLIN HFA 90 MCG INHALER: 108 (90 BAS | 17 days supply | Qty: 18 | Fill #0

## 2016-05-21 MED FILL — ALBUTEROL 0.083% INHAL SOLN: (2.5 MG/3ML | 6 days supply | Qty: 75 | Fill #1

## 2016-06-12 ENCOUNTER — Encounter: Payer: Self-pay | Admitting: Family Medicine

## 2016-06-12 ENCOUNTER — Ambulatory Visit: Payer: Commercial Managed Care - HMO | Attending: Family Medicine | Admitting: Family Medicine

## 2016-06-12 ENCOUNTER — Other Ambulatory Visit: Payer: Self-pay | Admitting: Family Medicine

## 2016-06-12 ENCOUNTER — Other Ambulatory Visit: Payer: Self-pay

## 2016-06-12 ENCOUNTER — Telehealth: Payer: Self-pay | Admitting: Family Medicine

## 2016-06-12 VITALS — BP 117/69 | HR 108 | Temp 98.4°F | Ht 64.0 in | Wt 113.0 lb

## 2016-06-12 DIAGNOSIS — M7989 Other specified soft tissue disorders: Secondary | ICD-10-CM | POA: Diagnosis not present

## 2016-06-12 DIAGNOSIS — L821 Other seborrheic keratosis: Secondary | ICD-10-CM | POA: Diagnosis not present

## 2016-06-12 DIAGNOSIS — R079 Chest pain, unspecified: Secondary | ICD-10-CM

## 2016-06-12 DIAGNOSIS — M79671 Pain in right foot: Secondary | ICD-10-CM | POA: Diagnosis not present

## 2016-06-12 DIAGNOSIS — F17210 Nicotine dependence, cigarettes, uncomplicated: Secondary | ICD-10-CM | POA: Diagnosis not present

## 2016-06-12 DIAGNOSIS — Z79899 Other long term (current) drug therapy: Secondary | ICD-10-CM | POA: Diagnosis not present

## 2016-06-12 DIAGNOSIS — M79674 Pain in right toe(s): Secondary | ICD-10-CM | POA: Insufficient documentation

## 2016-06-12 DIAGNOSIS — J42 Unspecified chronic bronchitis: Secondary | ICD-10-CM

## 2016-06-12 HISTORY — DX: Chest pain, unspecified: R07.9

## 2016-06-12 MED ORDER — ASPIRIN EC 81 MG PO TBEC: 81.0000 mg | DELAYED_RELEASE_TABLET | Freq: Every day | ORAL | 3 refills | Status: DC

## 2016-06-12 MED ORDER — NAPROXEN 500 MG PO TABS: 500.0000 mg | ORAL_TABLET | Freq: Two times a day (BID) | ORAL | 0 refills | Status: DC

## 2016-06-12 MED FILL — raNITIdine HCL 150 MG TABS: 150 | 30 days supply | Qty: 30 | Fill #2

## 2016-06-12 MED FILL — ASPIR-LOW 81 MG TABLET EC: 81 | 120 days supply | Qty: 120 | Fill #0

## 2016-06-12 MED FILL — ALBUTEROL 0.083% INHAL SOLN: (2.5 MG/3ML | 6 days supply | Qty: 75 | Fill #2

## 2016-06-12 MED FILL — SPIRIVA 18 MCG CP-HANDIHALE: 18 | 30 days supply | Qty: 30 | Fill #0

## 2016-06-12 MED FILL — VENTOLIN HFA 90 MCG INHALER: 108 (90 BAS | 17 days supply | Qty: 18 | Fill #0

## 2016-06-12 MED FILL — NAPROXEN 500 MG TABLET: 500 | 30 days supply | Qty: 30 | Fill #0

## 2016-06-12 NOTE — Progress Notes (Signed)
Chest pain/tightness x (1) month, intermittently. Smoker.Rt side spot x 61month.  Rt big toe red/edema x 2 weeks. Denies injury. Refill Ventolin and Spiriva. Priscille Heidelberg, RN, BSN

## 2016-06-12 NOTE — Patient Instructions (Addendum)
Margaret Henry was seen today for chest pain.  Diagnoses and all orders for this visit:  Chest pain, unspecified chest pain type -     EKG 12-Lead -     aspirin EC 81 MG tablet; Take 1 tablet (81 mg total) by mouth daily. -     Ambulatory referral to Cardiology  Seborrheic keratoses  Pain and swelling of toe of right foot -     Uric Acid  F/u in 3 months for COPD, CP   Dr. Adrian Blackwater

## 2016-06-12 NOTE — Assessment & Plan Note (Signed)
Chest pains in patient with CAD, COPD Non ischemic EKG  Plan: Card referral Smoking cessation Add daily ASA

## 2016-06-12 NOTE — Progress Notes (Signed)
Subjective:  Patient ID: Margaret Henry, female    DOB: 1951-08-12  Age: 65 y.o. MRN: [id number]  CC: Chest Pain   HPI ABIGAIL BIRCHENOUGH has COPD, CAD, curretn smoker she presents for   1. Chest pain: has intermittent chest heaviness that occurred last night while she was watching TV. Was associated with nausea. No emesis or SOB. She also has intermittent sharp pains that cross from R to L side of chest. No associated symptoms, last for 1-2 mins and goes. She has known hx of CAD, she takes lipitor every other day and tolerate this, no muscles aches. Not taking ASA. Smoking 3 cigs per day.   2. Spot of chest: stuck on spot on R upper chest for past month. Painless. 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   3. R great toe pain: pain and redness in R great toe near nail. No injury. Redness. This started two weeks ago.   Social History  Substance Use Topics  . Smoking status: Light Tobacco Smoker    Packs/day: 0.25    Years: 49.00    Types: Cigarettes  . Smokeless tobacco: Never Used     Comment: 3 cigarettes daily; Using Nicotine Patches 21mg  (04/12/16)  . Alcohol use No    Outpatient Medications Prior to Visit  Medication Sig Dispense Refill  . acetaminophen (TYLENOL) 500 MG tablet Take 1,500 mg by mouth every 4 (four) hours as needed. For headache    . albuterol (PROVENTIL) (2.5 MG/3ML) 0.083% nebulizer solution Take 3 mLs (2.5 mg total) by nebulization every 4 (four) hours as needed for wheezing or shortness of breath. 75 mL 2  . atorvastatin (LIPITOR) 40 MG tablet Take 1 tablet (40 mg total) by mouth daily. 90 tablet 3  . fluticasone (FLONASE) 50 MCG/ACT nasal spray Place 2 sprays into both nostrils daily. 16 g 2  . Fluticasone-Salmeterol (ADVAIR DISKUS) 500-50 MCG/DOSE AEPB Inhale 1 puff into the lungs 2 (two) times daily. 60 each 5  . loratadine (CLARITIN) 10 MG tablet Take 1 tablet (10 mg total) by mouth daily. 30 tablet 11  . loratadine (CLARITIN) 10  MG tablet Take 1 tablet (10 mg total) by mouth daily. 30 tablet 11  . nicotine (NICODERM CQ) 14 mg/24hr patch Place 1 patch (14 mg total) onto the skin daily. 28 patch 0  . Oxymetazoline HCl (SINEX LONG-ACTING NA) Place 2 sprays into the nose daily. Reported on 01/19/2016    . ranitidine (ZANTAC) 150 MG tablet Take 1 tablet (150 mg total) by mouth daily after supper. (30 mins after supper) 30 tablet 6  . SPIRIVA HANDIHALER 18 MCG inhalation capsule Place 1 capsule (18 mcg total) into inhaler and inhale daily. 30 capsule 5  . VENTOLIN HFA 108 (90 Base) MCG/ACT inhaler INHALE 2 PUFFS INTO THE LUNGS EVERY 4 (FOUR) HOURS AS NEEDED FOR WHEEZING OR SHORTNESS OF BREATH. 18 g 0   No facility-administered medications prior to visit.     ROS Review of Systems  Constitutional: Negative for chills and fever.  Eyes: Negative for visual disturbance.  Respiratory: Positive for cough (productive first thing in AM ) and shortness of breath.   Cardiovascular: Positive for chest pain.  Gastrointestinal: Negative for abdominal pain, blood in stool, constipation, diarrhea, nausea and vomiting.  Endocrine: Negative for cold intolerance.  Musculoskeletal: Positive for arthralgias and joint swelling. Negative for back pain.  Skin: Negative for rash.  Allergic/Immunologic: Negative for immunocompromised state.  Hematological: Negative for  adenopathy. Does not bruise/bleed easily.  Psychiatric/Behavioral: Negative for dysphoric mood and suicidal ideas.    Objective:  BP 117/69 (BP Location: Right Arm, Patient Position: Sitting, Cuff Size: Normal)   Pulse (!) 108   Temp 98.4 F (36.9 C) (Oral)   Ht 5\' 4"  (1.626 m)   Wt 113 lb (51.3 kg)   SpO2 94%   BMI 19.40 kg/m   BP/Weight 06/12/2016 04/12/2016 [id number]  Systolic BP [id number] 98 [id number]  Diastolic BP 69 64 62  Wt. (Lbs) 113 112.4 111.8  BMI 19.4 19.28 19.18   Physical Exam  Constitutional: She is oriented to person, place, and time. She appears well-developed  and well-nourished. No distress.  HENT:  Head: Normocephalic and atraumatic.  Nose: Mucosal edema present. Right sinus exhibits no maxillary sinus tenderness and no frontal sinus tenderness. Left sinus exhibits no maxillary sinus tenderness and no frontal sinus tenderness.  Cardiovascular: Normal rate, regular rhythm, normal heart sounds and intact distal pulses.   Pulmonary/Chest: Effort normal and breath sounds normal.  Musculoskeletal: She exhibits no edema.       Feet:  Neurological: She is alert and oriented to person, place, and time.  Skin: Skin is warm and dry. No rash noted.     Psychiatric: She has a normal mood and affect.   EKG: normal EKG, normal sinus rhythm.  Assessment & Plan:  Chinwe was seen today for chest pain.  Diagnoses and all orders for this visit:  Chest pain, unspecified chest pain type -     EKG 12-Lead -     aspirin EC 81 MG tablet; Take 1 tablet (81 mg total) by mouth daily. -     Ambulatory referral to Cardiology  Seborrheic keratoses  Pain and swelling of toe of right foot -     Uric Acid -     naproxen (NAPROSYN) 500 MG tablet; Take 1 tablet (500 mg total) by mouth 2 (two) times daily with a meal.   There are no diagnoses linked to this encounter.  No orders of the defined types were placed in this encounter.   Follow-up: No Follow-up on file.   Boykin Nearing MD

## 2016-06-12 NOTE — Assessment & Plan Note (Signed)
Pain in swelling in toe Suspect gout  Plan: Naproxen Check uric acid

## 2016-06-12 NOTE — Telephone Encounter (Signed)
Cll pt  LMOVMTC to schedule Lab work.

## 2016-06-14 ENCOUNTER — Ambulatory Visit: Payer: Commercial Managed Care - HMO | Attending: Family Medicine

## 2016-06-14 DIAGNOSIS — M79674 Pain in right toe(s): Secondary | ICD-10-CM

## 2016-06-14 DIAGNOSIS — M79671 Pain in right foot: Secondary | ICD-10-CM | POA: Diagnosis not present

## 2016-06-14 DIAGNOSIS — M7989 Other specified soft tissue disorders: Secondary | ICD-10-CM | POA: Insufficient documentation

## 2016-06-14 LAB — URIC ACID: URIC ACID, SERUM: 3.9 mg/dL (ref 2.5–7.0)

## 2016-06-14 NOTE — Progress Notes (Signed)
Pt here for lab work only from [facility] 06/12/16. Pt left after ov w/Kali Ambler unaware of labs being needed.   Labels were already printed and will be used for this lab visit.

## 2016-06-20 ENCOUNTER — Telehealth: Payer: Self-pay | Admitting: Family Medicine

## 2016-06-20 DIAGNOSIS — M79674 Pain in right toe(s): Secondary | ICD-10-CM

## 2016-06-20 DIAGNOSIS — M7989 Other specified soft tissue disorders: Principal | ICD-10-CM

## 2016-06-20 NOTE — Telephone Encounter (Signed)
Patient is wanting to speak to PCP regarding her gout...she is experiencing pain and oozing pus and blood..... Patient is taking medication prescribed and applying ice....please advised.Marland KitchenMarland KitchenMarland Kitchen

## 2016-06-21 ENCOUNTER — Other Ambulatory Visit: Payer: Commercial Managed Care - HMO

## 2016-06-21 MED ORDER — SULFAMETHOXAZOLE-TRIMETHOPRIM 800-160 MG PO TABS: 1.0000 | ORAL_TABLET | Freq: Two times a day (BID) | ORAL | 0 refills | Status: DC

## 2016-06-21 MED FILL — SULFAMETHOXAZOLE-TMP DS TAB: 800-160 | 10 days supply | Qty: 20 | Fill #0

## 2016-06-21 NOTE — Telephone Encounter (Signed)
Oozing pus and blood sounds like infection, not gout. This is very concerning  I have sent in bactrim DS, patient to start this and see me or get on same day schedule for f/u appt next week.

## 2016-06-21 NOTE — Telephone Encounter (Signed)
Pt was called on 9/14 and informed of meds being sent over.

## 2016-06-27 ENCOUNTER — Ambulatory Visit (INDEPENDENT_AMBULATORY_CARE_PROVIDER_SITE_OTHER): Payer: Commercial Managed Care - HMO | Admitting: Cardiology

## 2016-06-27 VITALS — BP 126/54 | HR 92 | Ht 64.0 in | Wt 113.0 lb

## 2016-06-27 DIAGNOSIS — R079 Chest pain, unspecified: Secondary | ICD-10-CM

## 2016-06-27 DIAGNOSIS — R0600 Dyspnea, unspecified: Secondary | ICD-10-CM

## 2016-06-27 DIAGNOSIS — Z720 Tobacco use: Secondary | ICD-10-CM | POA: Diagnosis not present

## 2016-06-27 DIAGNOSIS — R072 Precordial pain: Secondary | ICD-10-CM

## 2016-06-27 DIAGNOSIS — F17200 Nicotine dependence, unspecified, uncomplicated: Secondary | ICD-10-CM

## 2016-06-27 DIAGNOSIS — R0602 Shortness of breath: Secondary | ICD-10-CM

## 2016-06-27 DIAGNOSIS — Z8249 Family history of ischemic heart disease and other diseases of the circulatory system: Secondary | ICD-10-CM

## 2016-06-27 DIAGNOSIS — E785 Hyperlipidemia, unspecified: Secondary | ICD-10-CM

## 2016-06-27 HISTORY — DX: Dyspnea, unspecified: R06.00

## 2016-06-27 NOTE — Progress Notes (Signed)
Cardiology Office Note    Date:  06/30/2016   ID:  Margaret Henry, DOB 05-Jul-1951, MRN [id number]  PCP:  Minerva Ends, MD  Cardiologist:  Ena Dawley, MD   Chief complain: Exertional chest pain and DOE  History of Present Illness:  Margaret Henry is a 65 y.o. female with 35 PY h/o smoking, hyperlipidemia, COPD who presents with complain of exertional chest pain - pressure like radiating to her neck and DOE. DOE has been progressively worsening, now with mild exertion. She denies palpitations or syncope. Ongoing smoking. She had a chest CT for lung ca screening- I have personally reviewed it and it shows calcifications in all 3 coronary arteries, there is also upper borderline size of the ascending aorta and severe diffuse calcifications of the aortic arch and descending thoracic aorta. Denies LE edema, orthopnea or PND. No syncope.   Past Medical History:  Diagnosis Date  . Allergy    hayfever  . Bronchitis   . Cancer ([place])    skin cancer on chest  . Colon polyps   . Complication of anesthesia    pt states was given too much during nasal surgery 1989; difficulty getting awake  . COPD (chronic obstructive pulmonary disease) ([place])   . Diverticulitis   . GERD (gastroesophageal reflux disease)    occasional  . Hemorrhoids   . High cholesterol   . Osteoporosis   . Pneumonia   . Shortness of breath dyspnea    with exertion   . Thyroid goiter    bx benign  . Vertigo     Past Surgical History:  Procedure Laterality Date  . ABDOMINAL HYSTERECTOMY    . BILATERAL OOPHORECTOMY  2001   for benign ovarian mass   . BIOPSY THYROID    . DENTAL SURGERY     dentures  . NASAL SEPTUM SURGERY    . PARTIAL HYSTERECTOMY  1976   for heavy menses   . RECTAL EXAM UNDER ANESTHESIA N/A 12/14/2015   Procedure: RECTAL EXAM UNDER ANESTHESIA REMOVAL OF ANAL CANAL MASS; INTERNAL HEMORRHOID LIGATION, EXTERNAL HEMORRHOID LIGATION;  Surgeon: Michael Boston, MD;  Location: [facility]S;  Service:  General;  Laterality: N/A;  . TUBAL LIGATION    . WISDOM TOOTH EXTRACTION      Current Medications: Outpatient Medications Prior to Visit  Medication Sig Dispense Refill  . acetaminophen (TYLENOL) 500 MG tablet Take 1,500 mg by mouth every 4 (four) hours as needed. For headache    . albuterol (PROVENTIL) (2.5 MG/3ML) 0.083% nebulizer solution Take 3 mLs (2.5 mg total) by nebulization every 4 (four) hours as needed for wheezing or shortness of breath. 75 mL 2  . aspirin EC 81 MG tablet Take 1 tablet (81 mg total) by mouth daily. 90 tablet 3  . atorvastatin (LIPITOR) 40 MG tablet Take 1 tablet (40 mg total) by mouth daily. 90 tablet 3  . fluticasone (FLONASE) 50 MCG/ACT nasal spray Place 2 sprays into both nostrils daily. 16 g 2  . Fluticasone-Salmeterol (ADVAIR DISKUS) 500-50 MCG/DOSE AEPB Inhale 1 puff into the lungs 2 (two) times daily. 60 each 5  . loratadine (CLARITIN) 10 MG tablet Take 1 tablet (10 mg total) by mouth daily. 30 tablet 11  . naproxen (NAPROSYN) 500 MG tablet Take 1 tablet (500 mg total) by mouth 2 (two) times daily with a meal. 30 tablet 0  . nicotine (NICODERM CQ) 14 mg/24hr patch Place 1 patch (14 mg total) onto the skin daily. 28 patch 0  .  Oxymetazoline HCl (SINEX LONG-ACTING NA) Place 2 sprays into the nose daily. Reported on 01/19/2016    . ranitidine (ZANTAC) 150 MG tablet Take 1 tablet (150 mg total) by mouth daily after supper. (30 mins after supper) 30 tablet 6  . SPIRIVA HANDIHALER 18 MCG inhalation capsule Place 1 capsule (18 mcg total) into inhaler and inhale daily. 30 capsule 5  . sulfamethoxazole-trimethoprim (BACTRIM DS,SEPTRA DS) 800-160 MG tablet Take 1 tablet by mouth 2 (two) times daily. 20 tablet 0  . VENTOLIN HFA 108 (90 Base) MCG/ACT inhaler INHALE 2 PUFFS INTO THE LUNGS EVERY 4 (FOUR) HOURS AS NEEDED FOR WHEEZING OR SHORTNESS OF BREATH. 18 g 0  . loratadine (CLARITIN) 10 MG tablet Take 1 tablet (10 mg total) by mouth daily. (Patient not taking: Reported  on 06/27/2016) 30 tablet 11   No facility-administered medications prior to visit.      Allergies:   Hydrocodone and Propoxyphene n-acetaminophen   Social History   Social History  . Marital status: Widowed    Spouse name: N/A  . Number of children: N/A  . Years of education: N/A   Social History Main Topics  . Smoking status: Light Tobacco Smoker    Packs/day: 0.25    Years: 49.00    Types: Cigarettes  . Smokeless tobacco: Never Used     Comment: 3 cigarettes daily; Using Nicotine Patches 21mg  (04/12/16)  . Alcohol use No  . Drug use: No  . Sexual activity: Not on file   Other Topics Concern  . Not on file   Social History Narrative  . No narrative on file     Family History:  The patient's family history includes COPD in her father; Heart disease in her mother; Liver cancer in her paternal grandmother; Lung cancer in her maternal grandfather.   ROS:   Please see the history of present illness.    ROS All other systems reviewed and are negative.   PHYSICAL EXAM:   VS:  BP (!) 126/54   Pulse 92   Ht 5\' 4"  (1.626 m)   Wt 113 lb (51.3 kg)   BMI 19.40 kg/m    GEN: Well nourished, well developed, in no acute distress  HEENT: normal  Neck: no JVD, carotid bruits, or masses Cardiac: RRR; no murmurs, rubs, or gallops,no edema  Respiratory:  clear to auscultation bilaterally, normal work of breathing GI: soft, nontender, nondistended, + BS MS: no deformity or atrophy  Skin: warm and dry, no rash Neuro:  Alert and Oriented x 3, Strength and sensation are intact Psych: euthymic mood, full affect  Wt Readings from Last 3 Encounters:  06/27/16 113 lb (51.3 kg)  06/12/16 113 lb (51.3 kg)  04/12/16 112 lb 6.4 oz (51 kg)   Studies/Labs Reviewed:   EKG:  SR, 92 BPM, normal ECG  Recent Labs: 12/07/2015: Hemoglobin 15.2; Platelets 251 03/26/2016: ALT 9; BUN 11; Creat 0.66; Potassium 3.9; Sodium 143   Lipid Panel    Component Value Date/Time   CHOL 164 03/26/2016 1022    TRIG 207 (H) 03/26/2016 1022   HDL 62 03/26/2016 1022   CHOLHDL 2.6 03/26/2016 1022   VLDL 41 (H) 03/26/2016 1022   LDLCALC 61 03/26/2016 1022   LDLDIRECT 166 (H) 09/26/2015 1001    Additional studies/ records that were reviewed today include:   CT chest without contrast 08/2015 IMPRESSION: 1. Lung-RADS Category 2, benign appearance or behavior. Continue annual screening with low-dose chest CT without contrast in 12 months. Centrilobular emphysema  with at least 1 pulmonary nodule. 2.  Atherosclerosis, including within the coronary arteries. 3. Indeterminate partially calcified right-sided thyroid nodule. Consider ultrasound.   ASSESSMENT:    1. Precordial pain   2. SOB (shortness of breath)   3. Family history of early CAD   15. Smoking   5. Exertional chest pain   6. Hyperlipidemia      PLAN:  In order of problems listed above:  1. Schedule a Lexiscan nuclear stress test to evaluate for ischemia, she is not a good candidate for coronary CT given severe calcifications in all 3 coronary arteries, she is high risk for CAD - smoking, FH of premature CAD in both parents, hyperlipidemia. 2. Possible ascending aortic aneurysm, she is scheduled for an annual chest CT for lung nodule follow up, I will follow    Medication Adjustments/Labs and Tests Ordered: Current medicines are reviewed at length with the patient today.  Concerns regarding medicines are outlined above.  Medication changes, Labs and Tests ordered today are listed in the Patient Instructions below. Patient Instructions  Medication Instructions:   Your physician recommends that you continue on your current medications as directed. Please refer to the Current Medication list given to you today.    Testing/Procedures:  Your physician has requested that you have a lexiscan myoview. For further information please visit [email or web address]. Please follow instruction sheet, as given.    Follow-Up:  AT DR  Nakeyia Menden'S NEXT AVAILABLE APPOINTMENT       If you need a refill on your cardiac medications before your next appointment, please call your pharmacy.      Signed, Ena Dawley, MD  06/30/2016 12:41 PM    [address], [place], [place]  [address] Phone: [phone]; Fax: [phone]

## 2016-06-27 NOTE — Patient Instructions (Signed)
Medication Instructions:   Your physician recommends that you continue on your current medications as directed. Please refer to the Current Medication list given to you today.    Testing/Procedures:  Your physician has requested that you have a lexiscan myoview. For further information please visit [email or web address]. Please follow instruction sheet, as given.    Follow-Up:  AT DR NELSON'S NEXT AVAILABLE APPOINTMENT       If you need a refill on your cardiac medications before your next appointment, please call your pharmacy.

## 2016-07-02 ENCOUNTER — Other Ambulatory Visit: Payer: Self-pay | Admitting: Family Medicine

## 2016-07-02 DIAGNOSIS — J42 Unspecified chronic bronchitis: Secondary | ICD-10-CM

## 2016-07-02 MED FILL — ALBUTEROL 0.083% INHAL SOLN: (2.5 MG/3ML | 6 days supply | Qty: 75 | Fill #0

## 2016-07-02 MED FILL — ADVAIR 500/50 DISKUS: 500-50 | 30 days supply | Qty: 60 | Fill #1

## 2016-07-02 MED FILL — VENTOLIN HFA 90 MCG INHALER: 108 (90 BAS | 17 days supply | Qty: 18 | Fill #0

## 2016-07-02 MED FILL — LORATADINE 10 MG TABLET: 10 | 100 days supply | Qty: 100 | Fill #0

## 2016-07-04 ENCOUNTER — Telehealth (HOSPITAL_COMMUNITY): Payer: Self-pay | Admitting: *Deleted

## 2016-07-04 NOTE — Telephone Encounter (Signed)
Patient given detailed instructions per Myocardial Perfusion Study Information Sheet for the test on 07/06/16. Patient notified to arrive 15 minutes early and that it is imperative to arrive on time for appointment to keep from having the test rescheduled.  If you need to cancel or reschedule your appointment, please call the office within 24 hours of your appointment. Failure to do so may result in a cancellation of your appointment, and a $50 no show fee. Patient verbalized understanding. Margaret Henry   

## 2016-07-06 ENCOUNTER — Ambulatory Visit (HOSPITAL_COMMUNITY): Payer: Commercial Managed Care - HMO | Attending: Cardiology

## 2016-07-06 DIAGNOSIS — R079 Chest pain, unspecified: Secondary | ICD-10-CM | POA: Insufficient documentation

## 2016-07-06 DIAGNOSIS — R0602 Shortness of breath: Secondary | ICD-10-CM

## 2016-07-06 DIAGNOSIS — I2510 Atherosclerotic heart disease of native coronary artery without angina pectoris: Secondary | ICD-10-CM | POA: Insufficient documentation

## 2016-07-06 DIAGNOSIS — R072 Precordial pain: Secondary | ICD-10-CM

## 2016-07-06 LAB — MYOCARDIAL PERFUSION IMAGING
LV dias vol: 62 mL (ref 46–106)
LV sys vol: 16 mL
Peak HR: 130 {beats}/min
RATE: 0.27
Rest HR: 89 {beats}/min
SDS: 6
SRS: 6
SSS: 12
TID: 0.91

## 2016-07-06 MED ORDER — REGADENOSON 0.4 MG/5ML IV SOLN
0.4000 mg | Freq: Once | INTRAVENOUS | Status: AC
  Administered 2016-07-06: 0.4 mg via INTRAVENOUS

## 2016-07-06 MED ORDER — TECHNETIUM TC 99M TETROFOSMIN IV KIT
30.6000 | PACK | Freq: Once | INTRAVENOUS | Status: AC | PRN
  Administered 2016-07-06: 31 via INTRAVENOUS
  Filled 2016-07-06: qty 31

## 2016-07-06 MED ORDER — TECHNETIUM TC 99M TETROFOSMIN IV KIT
10.8000 | PACK | Freq: Once | INTRAVENOUS | Status: AC | PRN
  Administered 2016-07-06: 11 via INTRAVENOUS
  Filled 2016-07-06: qty 11

## 2016-07-10 ENCOUNTER — Encounter: Payer: Self-pay | Admitting: *Deleted

## 2016-07-10 MED FILL — SPIRIVA 18 MCG CP-HANDIHALE: 18 | 30 days supply | Qty: 30 | Fill #1

## 2016-07-10 MED FILL — FLUTICASONE PROP 50 MCG SPR: 50 | 30 days supply | Qty: 16 | Fill #2

## 2016-07-18 ENCOUNTER — Other Ambulatory Visit: Payer: Self-pay | Admitting: Internal Medicine

## 2016-07-18 ENCOUNTER — Telehealth: Payer: Self-pay | Admitting: Acute Care

## 2016-07-18 MED FILL — raNITIdine HCL 150 MG TABS: 150 | 30 days supply | Qty: 30 | Fill #3

## 2016-07-18 MED FILL — ALBUTEROL 0.083% INHAL SOLN: (2.5 MG/3ML | 6 days supply | Qty: 75 | Fill #0

## 2016-07-18 NOTE — Telephone Encounter (Signed)
Called and spoke with pt and she is aware that her PCP sent in the albuterol neb today to the med center.

## 2016-07-23 ENCOUNTER — Encounter: Payer: Self-pay | Admitting: Pulmonary Disease

## 2016-07-23 ENCOUNTER — Ambulatory Visit (INDEPENDENT_AMBULATORY_CARE_PROVIDER_SITE_OTHER): Payer: Commercial Managed Care - HMO | Admitting: Pulmonary Disease

## 2016-07-23 ENCOUNTER — Ambulatory Visit (INDEPENDENT_AMBULATORY_CARE_PROVIDER_SITE_OTHER)
Admission: RE | Admit: 2016-07-23 | Discharge: 2016-07-23 | Disposition: A | Discharge status: Home / Self Care | Payer: Commercial Managed Care - HMO | Source: Ambulatory Visit | Attending: Pulmonary Disease | Admitting: Pulmonary Disease

## 2016-07-23 VITALS — BP 110/80 | HR 107 | Ht 64.0 in | Wt 114.4 lb

## 2016-07-23 DIAGNOSIS — R0609 Other forms of dyspnea: Secondary | ICD-10-CM | POA: Diagnosis not present

## 2016-07-23 DIAGNOSIS — J209 Acute bronchitis, unspecified: Secondary | ICD-10-CM

## 2016-07-23 DIAGNOSIS — J439 Emphysema, unspecified: Secondary | ICD-10-CM

## 2016-07-23 DIAGNOSIS — Z87891 Personal history of nicotine dependence: Secondary | ICD-10-CM

## 2016-07-23 DIAGNOSIS — J42 Unspecified chronic bronchitis: Secondary | ICD-10-CM

## 2016-07-23 DIAGNOSIS — R05 Cough: Secondary | ICD-10-CM | POA: Diagnosis not present

## 2016-07-23 MED ORDER — ALBUTEROL SULFATE HFA 108 (90 BASE) MCG/ACT IN AERS: INHALATION_SPRAY | RESPIRATORY_TRACT | 5 refills | Status: DC

## 2016-07-23 MED ORDER — AZITHROMYCIN 250 MG PO TABS: ORAL_TABLET | ORAL | 0 refills | Status: DC

## 2016-07-23 MED ORDER — BUPROPION HCL ER (SR) 150 MG PO TB12
ORAL_TABLET | ORAL | 3 refills | Status: DC
Start: 2016-07-23 — End: 2017-03-11

## 2016-07-23 MED FILL — AZITHROMYCIN 250 MG TABLET: 250 | 5 days supply | Qty: 6 | Fill #0

## 2016-07-23 MED FILL — BUPROPION HCL SR 150 MG TAB: 150 | 63 days supply | Qty: 63 | Fill #0

## 2016-07-23 MED FILL — VENTOLIN HFA 90 MCG INHALER: 108 (90 BAS | 17 days supply | Qty: 18 | Fill #0

## 2016-07-23 NOTE — Progress Notes (Signed)
Margaret Henry    [id number]    03-10-1951  Primary Care Physician:FUNCHES, Lennox Laity, MD  Referring Physician: Boykin Nearing, MD [address] [place], [place] [address]  Chief complaint:   Follow up for COPD.  HPI: [name] is a 65 year old with history of COPD diagnosed in 2014. Her symptoms consist mainly of shortness of breath, daily cough with whitish sputum production, wheeze, dyspnea and exertion. She does not have any hemoptysis, chest pain, palpitations. No fevers, chills.  She is currently maintained on Spiriva and Advair. She states that this helps with her symptoms. She had temporarily been on Spiriva for a couple of months earlier this year because she could not afford it. At that time her cough increases. She has since restarted Spiriva with improvement in symptoms. She uses her albuterol rescue inhaler about 3-4 times a day.  She is a current smoker. She had smoked one pack per day for nearly 25 years. She is trying to quit and is down to 1 pack every week. She had used the nicotine patch in the past. 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 She does not recall any particular exposures at work or at home.  Interim history: She has complains of worsening cough over the past 2 weeks with brown thick tenacious sputum. She has increased dyspnea, wheezing. She denies any fevers, chills. She continues to smoke. She is using her albuterol rescue inhaler twice daily.   Outpatient Encounter Prescriptions as of 07/23/2016  Medication Sig  . acetaminophen (TYLENOL) 500 MG tablet Take 1,500 mg by mouth every 4 (four) hours as needed. For headache  . albuterol (PROVENTIL) (2.5 MG/3ML) 0.083% nebulizer solution Take 3 mLs (2.5 mg total) by nebulization every 4 (four) hours as needed for wheezing or shortness of breath.  Marland Kitchen albuterol (PROVENTIL) (2.5 MG/3ML) 0.083% nebulizer solution Take 3 mLs (2.5 mg total) by nebulization every 6 (six) hours  as needed for wheezing or shortness of breath.  Marland Kitchen aspirin EC 81 MG tablet Take 1 tablet (81 mg total) by mouth daily.  Marland Kitchen atorvastatin (LIPITOR) 40 MG tablet Take 1 tablet (40 mg total) by mouth daily.  . fluticasone (FLONASE) 50 MCG/ACT nasal spray Place 2 sprays into both nostrils daily.  . Fluticasone-Salmeterol (ADVAIR DISKUS) 500-50 MCG/DOSE AEPB Inhale 1 puff into the lungs 2 (two) times daily.  Marland Kitchen loratadine (CLARITIN) 10 MG tablet Take 1 tablet (10 mg total) by mouth daily.  . naproxen (NAPROSYN) 500 MG tablet Take 1 tablet (500 mg total) by mouth 2 (two) times daily with a meal.  . Oxymetazoline HCl (SINEX LONG-ACTING NA) Place 2 sprays into the nose daily. Reported on 01/19/2016  . ranitidine (ZANTAC) 150 MG tablet Take 1 tablet (150 mg total) by mouth daily after supper. (30 mins after supper)  . SPIRIVA HANDIHALER 18 MCG inhalation capsule Place 1 capsule (18 mcg total) into inhaler and inhale daily.  . VENTOLIN HFA 108 (90 Base) MCG/ACT inhaler INHALE 2 PUFFS BY MOUTH INTO THE LUNGS EVERY 4 (FOUR) HOURS AS NEEDED FOR WHEEZING OR SHORTNESS OF BREATH.  . nicotine (NICODERM CQ) 14 mg/24hr patch Place 1 patch (14 mg total) onto the skin daily. (Patient not taking: Reported on 07/23/2016)  . sulfamethoxazole-trimethoprim (BACTRIM DS,SEPTRA DS) 800-160 MG tablet Take 1 tablet by mouth 2 (two) times daily. (Patient not taking: Reported on 07/23/2016)   No facility-administered encounter medications on file as of 07/23/2016.  Allergies as of 07/23/2016 - Review Complete 07/23/2016  Allergen Reaction Noted  . Hydrocodone Nausea And Vomiting   . Propoxyphene n-acetaminophen Nausea And Vomiting     Past Medical History:  Diagnosis Date  . Allergy    hayfever  . Bronchitis   . Cancer ([place])    skin cancer on chest  . Colon polyps   . Complication of anesthesia    pt states was given too much during nasal surgery 1989; difficulty getting awake  . COPD (chronic obstructive pulmonary  disease) ([place])   . Diverticulitis   . GERD (gastroesophageal reflux disease)    occasional  . Hemorrhoids   . High cholesterol   . Osteoporosis   . Pneumonia   . Shortness of breath dyspnea    with exertion   . Thyroid goiter    bx benign  . Vertigo     Past Surgical History:  Procedure Laterality Date  . ABDOMINAL HYSTERECTOMY    . BILATERAL OOPHORECTOMY  2001   for benign ovarian mass   . BIOPSY THYROID    . DENTAL SURGERY     dentures  . NASAL SEPTUM SURGERY    . PARTIAL HYSTERECTOMY  1976   for heavy menses   . RECTAL EXAM UNDER ANESTHESIA N/A 12/14/2015   Procedure: RECTAL EXAM UNDER ANESTHESIA REMOVAL OF ANAL CANAL MASS; INTERNAL HEMORRHOID LIGATION, EXTERNAL HEMORRHOID LIGATION;  Surgeon: Michael Boston, MD;  Location: [facility];  Service: General;  Laterality: N/A;  . TUBAL LIGATION    . WISDOM TOOTH EXTRACTION      Family History  Problem Relation Age of Onset  . Heart disease Mother   . COPD Father   . Lung cancer Maternal Grandfather   . Liver cancer Paternal Grandmother   . Colon cancer Neg Hx   . Colon polyps Neg Hx   . Esophageal cancer Neg Hx   . Rectal cancer Neg Hx   . Stomach cancer Neg Hx     Social History   Social History  . Marital status: Widowed    Spouse name: N/A  . Number of children: N/A  . Years of education: N/A   Occupational History  . Not on file.   Social History Main Topics  . Smoking status: Light Tobacco Smoker    Packs/day: 0.25    Years: 49.00    Types: Cigarettes  . Smokeless tobacco: Never Used     Comment: 3 cigarettes daily; Using Nicotine Patches 21mg  (04/12/16)  . Alcohol use No  . Drug use: No  . Sexual activity: Not on file   Other Topics Concern  . Not on file   Social History Narrative  . No narrative on file     Review of systems: Review of Systems  Constitutional: Negative for fever and chills.  HENT: Negative.   Eyes: Negative for blurred vision.  Respiratory: as per HPI  Cardiovascular:  Negative for chest pain and palpitations.  Gastrointestinal: Negative for vomiting, diarrhea, blood per rectum. Genitourinary: Negative for dysuria, urgency, frequency and hematuria.  Musculoskeletal: Negative for myalgias, back pain and joint pain.  Skin: Negative for itching and rash.  Neurological: Negative for dizziness, tremors, focal weakness, seizures and loss of consciousness.  Endo/Heme/Allergies: Negative for environmental allergies.  Psychiatric/Behavioral: Negative for depression, suicidal ideas and hallucinations.  All other systems reviewed and are negative.   Physical Exam: Blood pressure 110/80, pulse (!) 107, height 5\' 4"  (1.626 m), weight 114 lb 6.4 oz (51.9 kg), SpO2 94 %.  Gen:      No acute distress HEENT:  EOMI, sclera anicteric Neck:     No masses; no thyromegaly Lungs:    Clear to auscultation bilaterally; normal respiratory effort CV:         Regular rate and rhythm; no murmurs Abd:      + bowel sounds; soft, non-tender; no palpable masses, no distension Ext:    No edema; adequate peripheral perfusion Skin:      Warm and dry; no rash Neuro: alert and oriented x 3 Psych: normal mood and affect  Data Reviewed: CXR (05/06/15) Hyperinflation of the lungs suggesting chronic obstructive pulmonary disease. No acute cardiopulmonary abnormality seen.  CT 11/21 Rads2 category  PFTs (07/21/15) FVC 2.06 66%) FEV1 0.93 (39%) F/F 45 DLCO 36% Unable complete pleth.  Assessment:  #1 Severe COPD,Gold Stage III, Chronic bronchitis with acute exacerbation She continues on Advair and Spiriva. She appears to be in middle of acute bronchitis episode. I will evaluate with a chest x-ray and azithromycin for 5 days. She is not wheezing in the office today so I don't think she'll require prednisone. She'll call us for any worsening, non-improvement of symptoms.  #2 Current smoker. Smoking cessation was re discussed with her. She wants to try to quit on her using the  nicotine patch. She's already been successful cutting down considerably on the number of cigarettes. She did not tolerate the Chantix due to diarrhea. `  Plan/Recommendations: - Continue spiriva, advair - CXR today, Azithromycin for 5 days. - Smoking cessation.   Marshell Garfinkel MD [facility] Pager [phone] 07/23/2016, 11:03 AM  CC: Boykin Nearing, MD

## 2016-07-23 NOTE — Patient Instructions (Signed)
We will start you on azithromycin. 500 mg today and then to 250 mg a day for total of 5 days. We will get a chest x-ray to evaluate for pneumonia. Please call us if symptoms worsen or do not improve.  Return to clinic in 6 months.

## 2016-07-25 NOTE — Progress Notes (Signed)
LMOMTCB x 1 

## 2016-07-27 ENCOUNTER — Telehealth: Payer: Self-pay | Admitting: Pulmonary Disease

## 2016-07-27 NOTE — Telephone Encounter (Signed)
Notes Recorded by Marshell Garfinkel, MD on 07/25/2016 at 1:36 PM EDT Please let the patient know that the CXR does not show any acute changes. There is no pneumonia.  Called and spoke with pt and she is aware of cxr results. Nothing further is needed.

## 2016-08-01 ENCOUNTER — Other Ambulatory Visit: Payer: Self-pay | Admitting: Acute Care

## 2016-08-01 MED FILL — ADVAIR 500/50 DISKUS: 500-50 | 30 days supply | Qty: 60 | Fill #2

## 2016-08-02 MED FILL — FLUTICASONE PROP 50 MCG SPR: 50 | 30 days supply | Qty: 16 | Fill #0

## 2016-08-09 MED FILL — VENTOLIN HFA 90 MCG INHALER: 108 (90 BAS | 17 days supply | Qty: 18 | Fill #1

## 2016-08-09 MED FILL — SPIRIVA 18 MCG CP-HANDIHALE: 18 | 30 days supply | Qty: 30 | Fill #2

## 2016-08-09 MED FILL — ALBUTEROL 0.083% INHAL SOLN: (2.5 MG/3ML | 6 days supply | Qty: 75 | Fill #1

## 2016-08-22 DIAGNOSIS — J449 Chronic obstructive pulmonary disease, unspecified: Secondary | ICD-10-CM | POA: Diagnosis not present

## 2016-08-28 MED FILL — ADVAIR 500/50 DISKUS: 500-50 | 30 days supply | Qty: 60 | Fill #3

## 2016-08-28 MED FILL — VENTOLIN HFA 90 MCG INHALER: 108 (90 BAS | 17 days supply | Qty: 18 | Fill #2

## 2016-08-28 MED FILL — raNITIdine HCL 150 MG TABS: 150 | 30 days supply | Qty: 30 | Fill #4

## 2016-09-05 ENCOUNTER — Ambulatory Visit (INDEPENDENT_AMBULATORY_CARE_PROVIDER_SITE_OTHER): Payer: Commercial Managed Care - HMO | Admitting: Cardiology

## 2016-09-05 ENCOUNTER — Telehealth: Payer: Self-pay | Admitting: *Deleted

## 2016-09-05 ENCOUNTER — Ambulatory Visit (INDEPENDENT_AMBULATORY_CARE_PROVIDER_SITE_OTHER)
Admission: RE | Admit: 2016-09-05 | Discharge: 2016-09-05 | Disposition: A | Discharge status: Home / Self Care | Payer: Commercial Managed Care - HMO | Source: Ambulatory Visit | Attending: Acute Care | Admitting: Acute Care

## 2016-09-05 VITALS — BP 128/62 | HR 102 | Ht 64.0 in | Wt 115.0 lb

## 2016-09-05 DIAGNOSIS — I2510 Atherosclerotic heart disease of native coronary artery without angina pectoris: Secondary | ICD-10-CM

## 2016-09-05 DIAGNOSIS — Z87891 Personal history of nicotine dependence: Secondary | ICD-10-CM | POA: Diagnosis not present

## 2016-09-05 DIAGNOSIS — F17210 Nicotine dependence, cigarettes, uncomplicated: Secondary | ICD-10-CM

## 2016-09-05 DIAGNOSIS — J438 Other emphysema: Secondary | ICD-10-CM

## 2016-09-05 DIAGNOSIS — E785 Hyperlipidemia, unspecified: Secondary | ICD-10-CM | POA: Diagnosis not present

## 2016-09-05 DIAGNOSIS — Z8249 Family history of ischemic heart disease and other diseases of the circulatory system: Secondary | ICD-10-CM

## 2016-09-05 DIAGNOSIS — F17200 Nicotine dependence, unspecified, uncomplicated: Secondary | ICD-10-CM

## 2016-09-05 DIAGNOSIS — I7121 Aneurysm of the ascending aorta, without rupture: Secondary | ICD-10-CM

## 2016-09-05 DIAGNOSIS — I712 Thoracic aortic aneurysm, without rupture: Secondary | ICD-10-CM

## 2016-09-05 LAB — CBC WITH DIFFERENTIAL/PLATELET
Basophils Absolute: 0 cells/uL (ref 0–200)
Basophils Relative: 0 %
Eosinophils Absolute: 288 cells/uL (ref 15–500)
Eosinophils Relative: 3 %
HCT: 44.1 % (ref 35.0–45.0)
Hemoglobin: 14.8 g/dL (ref 11.7–15.5)
Lymphocytes Relative: 25 %
Lymphs Abs: 2400 cells/uL (ref 850–3900)
MCH: 31.3 pg (ref 27.0–33.0)
MCHC: 33.6 g/dL (ref 32.0–36.0)
MCV: 93.2 fL (ref 80.0–100.0)
MPV: 9.5 fL (ref 7.5–12.5)
Monocytes Absolute: 864 cells/uL (ref 200–950)
Monocytes Relative: 9 %
Neutro Abs: 6048 cells/uL (ref 1500–7800)
Neutrophils Relative %: 63 %
Platelets: 293 10*3/uL (ref 140–400)
RBC: 4.73 MIL/uL (ref 3.80–5.10)
RDW: 13.2 % (ref 11.0–15.0)
WBC: 9.6 10*3/uL (ref 3.8–10.8)

## 2016-09-05 LAB — COMPREHENSIVE METABOLIC PANEL
ALT: 11 U/L (ref 6–29)
AST: 15 U/L (ref 10–35)
Albumin: 4.7 g/dL (ref 3.6–5.1)
Alkaline Phosphatase: 68 U/L (ref 33–130)
BUN: 11 mg/dL (ref 7–25)
CO2: 27 mmol/L (ref 20–31)
Calcium: 9.6 mg/dL (ref 8.6–10.4)
Chloride: 106 mmol/L (ref 98–110)
Creat: 0.6 mg/dL (ref 0.50–0.99)
Glucose, Bld: 78 mg/dL (ref 65–99)
Potassium: 4 mmol/L (ref 3.5–5.3)
Sodium: 142 mmol/L (ref 135–146)
Total Bilirubin: 0.4 mg/dL (ref 0.2–1.2)
Total Protein: 7.3 g/dL (ref 6.1–8.1)

## 2016-09-05 LAB — LIPID PANEL
Cholesterol: 177 mg/dL (ref <200)
HDL: 57 mg/dL (ref >50)
LDL Cholesterol: 81 mg/dL (ref <100)
Total CHOL/HDL Ratio: 3.1 Ratio (ref <5.0)
Triglycerides: 194 mg/dL — ABNORMAL HIGH (ref <150)
VLDL: 39 mg/dL — ABNORMAL HIGH (ref <30)

## 2016-09-05 NOTE — Patient Instructions (Signed)
Medication Instructions:   Your physician recommends that you continue on your current medications as directed. Please refer to the Current Medication list given to you today.    Labwork:  TODAY--CMET, CBC W DIFF, AND LIPIDS    Follow-Up:  Your physician wants you to follow-up in: [place] will receive a reminder letter in the mail two months in advance. If you don't receive a letter, please call our office to schedule the follow-up appointment.       If you need a refill on your cardiac medications before your next appointment, please call your pharmacy.

## 2016-09-05 NOTE — Telephone Encounter (Signed)
Notified the pt that per Dr Meda Coffee, she read her ct for lung cancer screening today, and there is no evidence of lung cancer but she has emphysema.  Informed the pt that per Dr Meda Coffee, the Radiologist recommended that she have a CT Chest WO contrast in one year, for follow-up of lung disease.  Also informed the pt that per Dr Meda Coffee, she reviewed her ascending aorta and there is no evidence of aneurysm.  Informed the pt that I will place the order for her CT Chest WO contrast to be repeated in one year, and have a [facility] scheduler call her back to arrange.  Pt verbalized understanding and agrees with this plan.

## 2016-09-05 NOTE — Telephone Encounter (Signed)
-----   Message from Dorothy Spark, MD sent at 09/05/2016  5:17 PM EST ----- Karlene Einstein, His left her know that there is no evidence for lung cancer but she has emphysema. Radiologist recommends repeat CT in a year. I have reviewed her ascending aorta and there is no evidence of aneurysm. Thank you, KN

## 2016-09-05 NOTE — Progress Notes (Signed)
Cardiology Office Note    Date:  09/05/2016   ID:  Margaret Henry, DOB [birth year], MRN [id number]  PCP:  Minerva Ends, MD  Cardiologist:  Ena Dawley, MD   Chief complain: Exertional chest pain and DOE  History of Present Illness:  Margaret Henry is a 65 y.o. female with 40 PY h/o smoking, hyperlipidemia, COPD who presents with complain of exertional chest pain - pressure like radiating to her neck and DOE. DOE has been progressively worsening, now with mild exertion. She denies palpitations or syncope. Ongoing smoking. She had a chest CT for lung ca screening- I have personally reviewed it and it shows calcifications in all 3 coronary arteries, there is also upper borderline size of the ascending aorta and severe diffuse calcifications of the aortic arch and descending thoracic aorta. Denies LE edema, orthopnea or PND. No syncope.  09/05/2016 - the patient is coming as a follow-up after nuclear stress test done on 07/06/2016 and was negative for ischemia and showed no prior infarct and no ischemia. Because of evidence of coronary calcification and elevated lipids the patient was started on atorvastatin 40 mg daily that she is tolerating well. She denies any further chest pain she has baseline shortness of breath that she attributes to COPD, she was recently treated for acute bronchitis requiring azithromycin and oral steroids.. His using ever and albuterol as needed.   Past Medical History:  Diagnosis Date  . Allergy    hayfever  . Bronchitis   . Cancer ([place])    skin cancer on chest  . Colon polyps   . Complication of anesthesia    pt states was given too much during nasal surgery 1989; difficulty getting awake  . COPD (chronic obstructive pulmonary disease) ([place])   . Diverticulitis   . GERD (gastroesophageal reflux disease)    occasional  . Hemorrhoids   . High cholesterol   . Osteoporosis   . Pneumonia   . Shortness of breath dyspnea    with exertion   . Thyroid  goiter    bx benign  . Vertigo     Past Surgical History:  Procedure Laterality Date  . ABDOMINAL HYSTERECTOMY    . BILATERAL OOPHORECTOMY  2001   for benign ovarian mass   . BIOPSY THYROID    . DENTAL SURGERY     dentures  . NASAL SEPTUM SURGERY    . PARTIAL HYSTERECTOMY  1976   for heavy menses   . RECTAL EXAM UNDER ANESTHESIA N/A 12/14/2015   Procedure: RECTAL EXAM UNDER ANESTHESIA REMOVAL OF ANAL CANAL MASS; INTERNAL HEMORRHOID LIGATION, EXTERNAL HEMORRHOID LIGATION;  Surgeon: Michael Boston, MD;  Location: [facility];  Service: General;  Laterality: N/A;  . TUBAL LIGATION    . WISDOM TOOTH EXTRACTION      Current Medications: Outpatient Medications Prior to Visit  Medication Sig Dispense Refill  . acetaminophen (TYLENOL) 500 MG tablet Take 1,500 mg by mouth every 4 (four) hours as needed. For headache    . albuterol (PROVENTIL) (2.5 MG/3ML) 0.083% nebulizer solution Take 3 mLs (2.5 mg total) by nebulization every 4 (four) hours as needed for wheezing or shortness of breath. 75 mL 2  . albuterol (PROVENTIL) (2.5 MG/3ML) 0.083% nebulizer solution Take 3 mLs (2.5 mg total) by nebulization every 6 (six) hours as needed for wheezing or shortness of breath. 75 mL 2  . albuterol (VENTOLIN HFA) 108 (90 Base) MCG/ACT inhaler INHALE 2 PUFFS BY MOUTH INTO THE LUNGS EVERY 4 (FOUR)  HOURS AS NEEDED FOR WHEEZING OR SHORTNESS OF BREATH. 18 g 5  . aspirin EC 81 MG tablet Take 1 tablet (81 mg total) by mouth daily. 90 tablet 3  . atorvastatin (LIPITOR) 40 MG tablet Take 1 tablet (40 mg total) by mouth daily. 90 tablet 3  . azithromycin (ZITHROMAX) 250 MG tablet Take 2 today, then 1 daily until gone 6 tablet 0  . buPROPion (WELLBUTRIN SR) 150 MG 12 hr tablet Take 1 tablet by mouth once daily x3 days, then increase to twice daily. 63 tablet 3  . fluticasone (FLONASE) 50 MCG/ACT nasal spray PLACE 2 SPRAYS INTO BOTH NOSTRILS DAILY. 16 g 5  . Fluticasone-Salmeterol (ADVAIR DISKUS) 500-50 MCG/DOSE AEPB Inhale  1 puff into the lungs 2 (two) times daily. 60 each 5  . loratadine (CLARITIN) 10 MG tablet Take 1 tablet (10 mg total) by mouth daily. 30 tablet 11  . naproxen (NAPROSYN) 500 MG tablet Take 1 tablet (500 mg total) by mouth 2 (two) times daily with a meal. 30 tablet 0  . nicotine (NICODERM CQ) 14 mg/24hr patch Place 1 patch (14 mg total) onto the skin daily. 28 patch 0  . Oxymetazoline HCl (SINEX LONG-ACTING NA) Place 2 sprays into the nose daily. Reported on 01/19/2016    . ranitidine (ZANTAC) 150 MG tablet Take 1 tablet (150 mg total) by mouth daily after supper. (30 mins after supper) 30 tablet 6  . SPIRIVA HANDIHALER 18 MCG inhalation capsule Place 1 capsule (18 mcg total) into inhaler and inhale daily. 30 capsule 5  . sulfamethoxazole-trimethoprim (BACTRIM DS,SEPTRA DS) 800-160 MG tablet Take 1 tablet by mouth 2 (two) times daily. 20 tablet 0   No facility-administered medications prior to visit.      Allergies:   Hydrocodone and Propoxyphene n-acetaminophen   Social History   Social History  . Marital status: Widowed    Spouse name: N/A  . Number of children: N/A  . Years of education: N/A   Social History Main Topics  . Smoking status: Light Tobacco Smoker    Packs/day: 0.25    Years: 49.00    Types: Cigarettes  . Smokeless tobacco: Never Used     Comment: 3 cigarettes daily; Using Nicotine Patches 21mg  (04/12/16)  . Alcohol use No  . Drug use: No  . Sexual activity: Not on file   Other Topics Concern  . Not on file   Social History Narrative  . No narrative on file     Family History:  The patient's family history includes COPD in her father; Heart disease in her mother; Liver cancer in her paternal grandmother; Lung cancer in her maternal grandfather.   ROS:   Please see the history of present illness.    ROS All other systems reviewed and are negative.   PHYSICAL EXAM:   VS:  BP 128/62   Pulse (!) 102   Ht 5\' 4"  (1.626 m)   Wt 115 lb (52.2 kg)   BMI 19.74  kg/m    GEN: Well nourished, well developed, in no acute distress  HEENT: normal  Neck: no JVD, carotid bruits, or masses Cardiac: RRR; no murmurs, rubs, or gallops,no edema  Respiratory:  clear to auscultation bilaterally, normal work of breathing GI: soft, nontender, nondistended, + BS MS: no deformity or atrophy  Skin: warm and dry, no rash Neuro:  Alert and Oriented x 3, Strength and sensation are intact Psych: euthymic mood, full affect  Wt Readings from Last 3 Encounters:  09/05/16  115 lb (52.2 kg)  07/23/16 114 lb 6.4 oz (51.9 kg)  07/06/16 113 lb (51.3 kg)   Studies/Labs Reviewed:   EKG:  SR, 92 BPM, normal ECG  Recent Labs: 12/07/2015: Hemoglobin 15.2; Platelets 251 03/26/2016: ALT 9; BUN 11; Creat 0.66; Potassium 3.9; Sodium 143   Lipid Panel    Component Value Date/Time   CHOL 164 03/26/2016 1022   TRIG 207 (H) 03/26/2016 1022   HDL 62 03/26/2016 1022   CHOLHDL 2.6 03/26/2016 1022   VLDL 41 (H) 03/26/2016 1022   LDLCALC 61 03/26/2016 1022   LDLDIRECT 166 (H) 09/26/2015 1001    Additional studies/ records that were reviewed today include:   CT chest without contrast 08/2015 IMPRESSION: 1. Lung-RADS Category 2, benign appearance or behavior. Continue annual screening with low-dose chest CT without contrast in 12 months. Centrilobular emphysema with at least 1 pulmonary nodule. 2.  Atherosclerosis, including within the coronary arteries. 3. Indeterminate partially calcified right-sided thyroid nodule. Consider ultrasound.   ASSESSMENT:    1. Family history of early CAD   2. Hyperlipidemia, unspecified hyperlipidemia type   3. Coronary artery calcification seen on CAT scan   4. Ascending aortic aneurysm (HCC)      PLAN:  In order of problems listed above:  1. Coronary calcification  - [place] stress test in September 2017 was negative prior infarct or ischemia. We will treat with aspirin and atorvastatin.  2. Hyperlipidemia - started on atorvastatin  40 mg daily that she is tolerating well, we will recheck lipids and LFTs today.  3. Possible ascending aortic aneurysm, she just underwent an annual chest CT for lung nodule, I have personally reviewed CT in maximum diameter over ascending aorta is 36 mm what is within normal limits.    Medication Adjustments/Labs and Tests Ordered: Current medicines are reviewed at length with the patient today.  Concerns regarding medicines are outlined above.  Medication changes, Labs and Tests ordered today are listed in the Patient Instructions below. Patient Instructions  Medication Instructions:   Your physician recommends that you continue on your current medications as directed. Please refer to the Current Medication list given to you today.    Labwork:  TODAY--CMET, CBC W DIFF, AND LIPIDS    Follow-Up:  Your physician wants you to follow-up in: [place] will receive a reminder letter in the mail two months in advance. If you don't receive a letter, please call our office to schedule the follow-up appointment.       If you need a refill on your cardiac medications before your next appointment, please call your pharmacy.      Signed, Ena Dawley, MD  09/05/2016 5:08 PM    [facility] [place], [place], [place]  [address] Phone: [phone]; Fax: [phone]

## 2016-09-06 ENCOUNTER — Telehealth: Payer: Self-pay | Admitting: *Deleted

## 2016-09-06 MED ORDER — FISH OIL 1000 MG PO CAPS: 1000.0000 mg | ORAL_CAPSULE | Freq: Every day | ORAL | 6 refills | Status: DC

## 2016-09-06 MED FILL — FISH OIL 1,000 MG CAPSULE: 1000 | 100 days supply | Qty: 100 | Fill #0

## 2016-09-06 MED FILL — SPIRIVA 18 MCG CP-HANDIHALE: 18 | 30 days supply | Qty: 30 | Fill #3

## 2016-09-06 NOTE — Telephone Encounter (Signed)
Per Marzetta Board in CT, Margaret Henry will take over and call this pt back to do a repeat lung cancer screening in one year.

## 2016-09-06 NOTE — Telephone Encounter (Signed)
-----   Message from Dorothy Spark, MD sent at 09/05/2016 11:55 PM EST ----- Normal CMP, great LDL and HDL, improved but still elevated TG, I would add OTC fish oil.

## 2016-09-06 NOTE — Telephone Encounter (Signed)
Notified the pt that per Dr Meda Coffee, her labs showed that she had a normal cmet, great LDL and HDL, improved but still elevated triglycerides, and she recommends that we add OTC Fish Oil 1000 mg po daily, to her regimen.  Pt request for this to be sent in as a script.  Confirmed the pharmacy of choice with the pt.  Pt verbalized understanding and agrees with this plan.

## 2016-09-10 ENCOUNTER — Ambulatory Visit: Payer: Commercial Managed Care - HMO | Attending: Family Medicine | Admitting: Family Medicine

## 2016-09-10 ENCOUNTER — Encounter: Payer: Self-pay | Admitting: Family Medicine

## 2016-09-10 VITALS — BP 109/62 | HR 112 | Temp 97.7°F | Ht 64.0 in | Wt 116.2 lb

## 2016-09-10 DIAGNOSIS — K5909 Other constipation: Secondary | ICD-10-CM | POA: Diagnosis not present

## 2016-09-10 DIAGNOSIS — I2510 Atherosclerotic heart disease of native coronary artery without angina pectoris: Secondary | ICD-10-CM | POA: Diagnosis not present

## 2016-09-10 DIAGNOSIS — Z23 Encounter for immunization: Secondary | ICD-10-CM | POA: Diagnosis not present

## 2016-09-10 DIAGNOSIS — J438 Other emphysema: Secondary | ICD-10-CM | POA: Diagnosis not present

## 2016-09-10 DIAGNOSIS — M546 Pain in thoracic spine: Secondary | ICD-10-CM | POA: Diagnosis not present

## 2016-09-10 DIAGNOSIS — L03031 Cellulitis of right toe: Secondary | ICD-10-CM | POA: Diagnosis not present

## 2016-09-10 DIAGNOSIS — D013 Carcinoma in situ of anus and anal canal: Secondary | ICD-10-CM | POA: Diagnosis not present

## 2016-09-10 DIAGNOSIS — J449 Chronic obstructive pulmonary disease, unspecified: Secondary | ICD-10-CM | POA: Insufficient documentation

## 2016-09-10 DIAGNOSIS — J441 Chronic obstructive pulmonary disease with (acute) exacerbation: Secondary | ICD-10-CM

## 2016-09-10 DIAGNOSIS — Z7982 Long term (current) use of aspirin: Secondary | ICD-10-CM | POA: Diagnosis not present

## 2016-09-10 DIAGNOSIS — Z79899 Other long term (current) drug therapy: Secondary | ICD-10-CM | POA: Insufficient documentation

## 2016-09-10 DIAGNOSIS — F17210 Nicotine dependence, cigarettes, uncomplicated: Secondary | ICD-10-CM | POA: Insufficient documentation

## 2016-09-10 DIAGNOSIS — K644 Residual hemorrhoidal skin tags: Secondary | ICD-10-CM | POA: Diagnosis not present

## 2016-09-10 MED ORDER — PREDNISONE 50 MG PO TABS: 50.0000 mg | ORAL_TABLET | Freq: Every day | ORAL | 0 refills | Status: DC

## 2016-09-10 MED ORDER — ZOSTER VACCINE LIVE 19400 UNT/0.65ML ~~LOC~~ SUSR: 0.6500 mL | Freq: Once | SUBCUTANEOUS | 0 refills | Status: AC

## 2016-09-10 MED FILL — predniSONE 50 MG TABS: 50 | 5 days supply | Qty: 5 | Fill #0

## 2016-09-10 NOTE — Progress Notes (Signed)
Pt is here today to follow up on COPD.  Pt is getting tdap shot today.

## 2016-09-10 NOTE — Assessment & Plan Note (Addendum)
COPD with worseing cough concerning for acute exacerbation Plan: Prednisone course x 5 days Increase albuterol to q 6 hrs for 5 days

## 2016-09-10 NOTE — Patient Instructions (Addendum)
Margaret Henry was seen today for copd.  Diagnoses and all orders for this visit:  Need for Zostavax administration -     Zoster Vaccine Live, PF, (ZOSTAVAX) 29562 UNT/0.65ML injection; Inject 19,400 Units into the skin once.  Other emphysema (HCC) -     predniSONE (DELTASONE) 50 MG tablet; Take 1 tablet (50 mg total) by mouth daily with breakfast.  Other orders -     Tdap vaccine greater than or equal to 66yo IM  increase albuterol to 3-4 times a day, every 6 hrs while awake for next 5 days  F/u in 3 months for COPD and CAD  Follow up sooner if needed  Dr. Adrian Blackwater

## 2016-09-10 NOTE — Progress Notes (Signed)
Subjective:  Patient ID: Margaret Henry, female    DOB: 11-14-1950  Age: 65 y.o. MRN: [id number]  CC: COPD   HPI SAVINA BARRIOS has COPD, CAD, current smoker she presents for   1.COPD: she is smoking about 4 cigs per day. She is taking Wellbutrin to help with smoking cessation. She is using her nebulizer twice daily.  Also using Advair and Spiriva. She is established with pulmonology. She reports increased productive cough and worsening dyspnea over past 10 days. She also has mid to L upper back pain. She denies fever and chills.   2. R great toe cellulitis: improved. No swelling or pain. Has scab on R dorsal toe.   Social History  Substance Use Topics  . Smoking status: Light Tobacco Smoker    Packs/day: 0.25    Years: 49.00    Types: Cigarettes  . Smokeless tobacco: Never Used     Comment: 3 cigarettes daily; Using Nicotine Patches 21mg  (04/12/16)  . Alcohol use No    Outpatient Medications Prior to Visit  Medication Sig Dispense Refill  . acetaminophen (TYLENOL) 500 MG tablet Take 1,500 mg by mouth every 4 (four) hours as needed. For headache    . albuterol (PROVENTIL) (2.5 MG/3ML) 0.083% nebulizer solution Take 3 mLs (2.5 mg total) by nebulization every 4 (four) hours as needed for wheezing or shortness of breath. 75 mL 2  . albuterol (PROVENTIL) (2.5 MG/3ML) 0.083% nebulizer solution Take 3 mLs (2.5 mg total) by nebulization every 6 (six) hours as needed for wheezing or shortness of breath. 75 mL 2  . albuterol (VENTOLIN HFA) 108 (90 Base) MCG/ACT inhaler INHALE 2 PUFFS BY MOUTH INTO THE LUNGS EVERY 4 (FOUR) HOURS AS NEEDED FOR WHEEZING OR SHORTNESS OF BREATH. 18 g 5  . aspirin EC 81 MG tablet Take 1 tablet (81 mg total) by mouth daily. 90 tablet 3  . atorvastatin (LIPITOR) 40 MG tablet Take 1 tablet (40 mg total) by mouth daily. 90 tablet 3  . buPROPion (WELLBUTRIN SR) 150 MG 12 hr tablet Take 1 tablet by mouth once daily x3 days, then increase to twice daily. 63 tablet 3  .  fluticasone (FLONASE) 50 MCG/ACT nasal spray PLACE 2 SPRAYS INTO BOTH NOSTRILS DAILY. 16 g 5  . Fluticasone-Salmeterol (ADVAIR DISKUS) 500-50 MCG/DOSE AEPB Inhale 1 puff into the lungs 2 (two) times daily. 60 each 5  . loratadine (CLARITIN) 10 MG tablet Take 1 tablet (10 mg total) by mouth daily. 30 tablet 11  . nicotine (NICODERM CQ) 14 mg/24hr patch Place 1 patch (14 mg total) onto the skin daily. 28 patch 0  . Omega-3 Fatty Acids (FISH OIL) 1000 MG CAPS Take 1 capsule (1,000 mg total) by mouth daily. 30 capsule 6  . Oxymetazoline HCl (SINEX LONG-ACTING NA) Place 2 sprays into the nose daily. Reported on 01/19/2016    . ranitidine (ZANTAC) 150 MG tablet Take 1 tablet (150 mg total) by mouth daily after supper. (30 mins after supper) 30 tablet 6  . SPIRIVA HANDIHALER 18 MCG inhalation capsule Place 1 capsule (18 mcg total) into inhaler and inhale daily. 30 capsule 5  . azithromycin (ZITHROMAX) 250 MG tablet Take 2 today, then 1 daily until gone (Patient not taking: Reported on 09/10/2016) 6 tablet 0  . naproxen (NAPROSYN) 500 MG tablet Take 1 tablet (500 mg total) by mouth 2 (two) times daily with a meal. (Patient not taking: Reported on 09/10/2016) 30 tablet 0  . sulfamethoxazole-trimethoprim (BACTRIM DS,SEPTRA DS) 800-160  MG tablet Take 1 tablet by mouth 2 (two) times daily. (Patient not taking: Reported on 09/10/2016) 20 tablet 0   No facility-administered medications prior to visit.     ROS Review of Systems  Constitutional: Negative for chills and fever.  Eyes: Negative for visual disturbance.  Respiratory: Positive for cough (productive first thing in AM ) and shortness of breath.   Cardiovascular: Positive for chest pain.  Gastrointestinal: Negative for abdominal pain, blood in stool, constipation, diarrhea, nausea and vomiting.  Endocrine: Negative for cold intolerance.  Musculoskeletal: Positive for arthralgias, back pain and joint swelling.  Skin: Negative for rash.    Allergic/Immunologic: Negative for immunocompromised state.  Hematological: Negative for adenopathy. Does not bruise/bleed easily.  Psychiatric/Behavioral: Negative for dysphoric mood and suicidal ideas.    Objective:  BP 109/62 (BP Location: Left Arm, Patient Position: Sitting, Cuff Size: Small)   Pulse (!) 112   Temp 97.7 F (36.5 C) (Oral)   Ht 5\' 4"  (1.626 m)   Wt 116 lb 3.2 oz (52.7 kg)   SpO2 93%   BMI 19.95 kg/m   BP/Weight 09/10/2016 09/05/2016 0000000  Systolic BP 0000000 0000000 [id number]  Diastolic BP 62 62 80  Wt. (Lbs) 116.2 115 114.4  BMI 19.95 19.74 19.64    Physical Exam  Constitutional: She is oriented to person, place, and time. She appears well-developed and well-nourished. No distress.  HENT:  Head: Normocephalic and atraumatic.  Nose: Mucosal edema present. Right sinus exhibits no maxillary sinus tenderness and no frontal sinus tenderness. Left sinus exhibits no maxillary sinus tenderness and no frontal sinus tenderness.  Cardiovascular: Normal rate, regular rhythm, normal heart sounds and intact distal pulses.   Pulmonary/Chest: Effort normal and breath sounds normal.  Musculoskeletal: She exhibits no edema.       Back:       Feet:  Neurological: She is alert and oriented to person, place, and time.  Skin: Skin is warm and dry. No rash noted.     Psychiatric: She has a normal mood and affect.     Assessment & Plan:  Sabra was seen today for copd.  Diagnoses and all orders for this visit:  Need for Zostavax administration -     Zoster Vaccine Live, PF, (ZOSTAVAX) 60454 UNT/0.65ML injection; Inject 19,400 Units into the skin once.  Other emphysema (HCC) -     predniSONE (DELTASONE) 50 MG tablet; Take 1 tablet (50 mg total) by mouth daily with breakfast.  Other orders -     Tdap vaccine greater than or equal to 7yo IM   There are no diagnoses linked to this encounter.  No orders of the defined types were placed in this encounter.   Follow-up:  Return in about 3 months (around 12/09/2016) for COPD/CAD .   Boykin Nearing MD

## 2016-09-11 ENCOUNTER — Other Ambulatory Visit: Payer: Self-pay | Admitting: Acute Care

## 2016-09-11 ENCOUNTER — Telehealth: Payer: Self-pay | Admitting: Acute Care

## 2016-09-11 DIAGNOSIS — F17210 Nicotine dependence, cigarettes, uncomplicated: Secondary | ICD-10-CM

## 2016-09-11 NOTE — Telephone Encounter (Signed)
This note is to document that Dr. Ottie Glazier gave Ms. Margaret Henry the results of her low dose CT during  her office appointment with cardiology 09/05/2016. The patient verbalized understanding of the results. As the scan was read as a Lung Rads 1, annual scanning is recommended for 08/2017. We will place the order and schedule the scan at that time. I will fax a copy of the results to the patients PCP.

## 2016-09-17 MED FILL — VENTOLIN HFA 90 MCG INHALER: 108 (90 BAS | 17 days supply | Qty: 18 | Fill #3

## 2016-09-17 MED FILL — ALBUTEROL 0.083% INHAL SOLN: (2.5 MG/3ML | 6 days supply | Qty: 75 | Fill #2

## 2016-09-17 MED FILL — FLUTICASONE PROP 50 MCG SPR: 50 | 30 days supply | Qty: 16 | Fill #1

## 2016-09-21 DIAGNOSIS — J449 Chronic obstructive pulmonary disease, unspecified: Secondary | ICD-10-CM | POA: Diagnosis not present

## 2016-10-02 ENCOUNTER — Other Ambulatory Visit: Payer: Self-pay | Admitting: Family Medicine

## 2016-10-02 MED FILL — LORATADINE 10 MG TABLET: 10 | 100 days supply | Qty: 100 | Fill #1

## 2016-10-02 MED FILL — SPIRIVA 18 MCG CP-HANDIHALE: 18 | 30 days supply | Qty: 30 | Fill #4

## 2016-10-02 MED FILL — raNITIdine HCL 150 MG TABS: 150 | 30 days supply | Qty: 30 | Fill #5

## 2016-10-02 MED FILL — ADVAIR 500/50 DISKUS: 500-50 | 30 days supply | Qty: 60 | Fill #4

## 2016-10-02 MED FILL — ALBUTEROL 0.083% INHAL SOLN: (2.5 MG/3ML | 6 days supply | Qty: 75 | Fill #0

## 2016-10-03 MED FILL — VENTOLIN HFA 90 MCG INHALER: 108 (90 BAS | 17 days supply | Qty: 18 | Fill #4

## 2016-10-22 DIAGNOSIS — J449 Chronic obstructive pulmonary disease, unspecified: Secondary | ICD-10-CM | POA: Diagnosis not present

## 2016-10-22 MED FILL — VENTOLIN HFA 90 MCG INHALER: 108 (90 BAS | 17 days supply | Qty: 18 | Fill #5

## 2016-10-22 MED FILL — ATORVASTATIN 40 MG TABLET: 40 | 90 days supply | Qty: 90 | Fill #1

## 2016-10-22 MED FILL — ALBUTEROL 0.083% INHAL SOLN: (2.5 MG/3ML | 6 days supply | Qty: 75 | Fill #1

## 2016-11-02 MED FILL — FLUTICASONE PROP 50 MCG SPR: 50 | 30 days supply | Qty: 16 | Fill #2

## 2016-11-02 MED FILL — ADVAIR 500/50 DISKUS: 500-50 | 30 days supply | Qty: 60 | Fill #5

## 2016-11-02 MED FILL — raNITIdine HCL 150 MG TABS: 150 | 30 days supply | Qty: 30 | Fill #6

## 2016-11-02 MED FILL — SPIRIVA 18 MCG CP-HANDIHALE: 18 | 30 days supply | Qty: 30 | Fill #5

## 2016-11-13 ENCOUNTER — Other Ambulatory Visit: Payer: Self-pay | Admitting: Pulmonary Disease

## 2016-11-13 DIAGNOSIS — J42 Unspecified chronic bronchitis: Secondary | ICD-10-CM

## 2016-11-13 MED FILL — ALBUTEROL 0.083% INHAL SOLN: (2.5 MG/3ML | 6 days supply | Qty: 75 | Fill #2

## 2016-11-13 MED FILL — ASPIR-LOW EC 81 MG TABLET: 81 | 120 days supply | Qty: 120 | Fill #1

## 2016-11-13 MED FILL — VENTOLIN HFA 90 MCG INHALER: 108 (90 BAS | 17 days supply | Qty: 18 | Fill #0

## 2016-11-22 DIAGNOSIS — J449 Chronic obstructive pulmonary disease, unspecified: Secondary | ICD-10-CM | POA: Diagnosis not present

## 2016-12-03 ENCOUNTER — Other Ambulatory Visit: Payer: Self-pay | Admitting: Family Medicine

## 2016-12-03 ENCOUNTER — Other Ambulatory Visit: Payer: Self-pay | Admitting: Acute Care

## 2016-12-03 DIAGNOSIS — J42 Unspecified chronic bronchitis: Secondary | ICD-10-CM

## 2016-12-03 MED FILL — FLUTICASONE PROP 50 MCG SPR: 50 | 30 days supply | Qty: 16 | Fill #3

## 2016-12-03 MED FILL — ALBUTEROL 0.083% INHAL SOLN: (2.5 MG/3ML | 6 days supply | Qty: 75 | Fill #0

## 2016-12-03 MED FILL — SPIRIVA 18 MCG CP-HANDIHALE: 18 | 30 days supply | Qty: 30 | Fill #0

## 2016-12-03 MED FILL — VENTOLIN HFA 90 MCG INHALER: 108 (90 BAS | 17 days supply | Qty: 18 | Fill #1

## 2016-12-03 MED FILL — raNITIdine HCL 150 MG TABS: 150 | 30 days supply | Qty: 30 | Fill #0

## 2016-12-03 MED FILL — ADVAIR 500/50 DISKUS: 500-50 | 30 days supply | Qty: 60 | Fill #0

## 2016-12-20 DIAGNOSIS — J449 Chronic obstructive pulmonary disease, unspecified: Secondary | ICD-10-CM | POA: Diagnosis not present

## 2016-12-21 MED FILL — ALBUTEROL 0.083% INHAL SOLN: (2.5 MG/3ML | 6 days supply | Qty: 75 | Fill #1

## 2016-12-21 MED FILL — VENTOLIN HFA 90 MCG INHALER: 108 (90 BAS | 17 days supply | Qty: 18 | Fill #2

## 2016-12-28 ENCOUNTER — Other Ambulatory Visit: Payer: Self-pay | Admitting: Family Medicine

## 2017-01-03 MED FILL — LORATADINE 10 MG TABLET: 10 | 100 days supply | Qty: 100 | Fill #2

## 2017-01-03 MED FILL — raNITIdine HCL 150 MG TABS: 150 | 30 days supply | Qty: 30 | Fill #1

## 2017-01-03 MED FILL — FLUTICASONE PROP 50 MCG SPR: 50 | 30 days supply | Qty: 16 | Fill #4

## 2017-01-03 MED FILL — VENTOLIN HFA 90 MCG INHALER: 108 (90 BAS | 17 days supply | Qty: 18 | Fill #3

## 2017-01-03 MED FILL — ADVAIR 500/50 DISKUS: 500-50 | 30 days supply | Qty: 60 | Fill #1

## 2017-01-03 MED FILL — SPIRIVA 18 MCG CP-HANDIHALE: 18 | 30 days supply | Qty: 30 | Fill #1

## 2017-01-08 MED FILL — ALBUTEROL 0.083% INHAL SOLN: (2.5 MG/3ML | 6 days supply | Qty: 75 | Fill #2

## 2017-01-20 DIAGNOSIS — J449 Chronic obstructive pulmonary disease, unspecified: Secondary | ICD-10-CM | POA: Diagnosis not present

## 2017-01-28 ENCOUNTER — Other Ambulatory Visit: Payer: Self-pay | Admitting: Family Medicine

## 2017-01-28 MED FILL — VENTOLIN HFA 90 MCG INHALER: 108 (90 BAS | 17 days supply | Qty: 18 | Fill #4

## 2017-01-28 MED FILL — ALBUTEROL 0.083% INHAL SOLN: (2.5 MG/3ML | 6 days supply | Qty: 75 | Fill #0

## 2017-02-05 MED FILL — SPIRIVA 18 MCG CP-HANDIHALE: 18 | 30 days supply | Qty: 30 | Fill #2

## 2017-02-05 MED FILL — raNITIdine HCL 150 MG TABS: 150 | 30 days supply | Qty: 30 | Fill #2

## 2017-02-07 IMAGING — NM NM MISC PROCEDURE
3 series · 18 of 18 positions shown · non-contrast
Comparison: none

[Series 1: wbr_s-proj_st stress_(id)_sa · 6.5mm · 6.51mm/px · 6 of 64 frames shown (1 of 2)]
[frame 6/64]
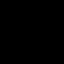
[frame 16/64]
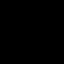
[frame 27/64]
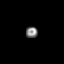
[frame 38/64]
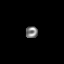
[frame 48/64]
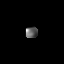
[frame 59/64]
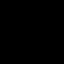

[Series 1: wbr_r-proj_st rest_(id)_sa · 6.5mm · 6.51mm/px · 6 of 64 frames shown]
[frame 6/64]
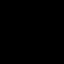
[frame 16/64]
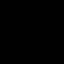
[frame 27/64]
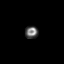
[frame 38/64]
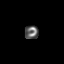
[frame 48/64]
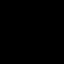
[frame 59/64]
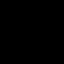

[Series 1: wbr_s-proj_st stress_(id)_sa · 6.5mm · 6.51mm/px · 6 of 512 frames shown (2 of 2)]
[frame 43/512]
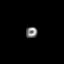
[frame 128/512]
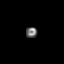
[frame 214/512]
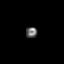
[frame 299/512]
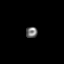
[frame 384/512]
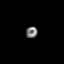
[frame 470/512]
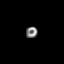

[18 of 18 positions shown; findings below may reference images not displayed]

Canned report from images found in remote index.

Refer to host system for actual result text.

## 2017-02-08 MED FILL — ADVAIR 500/50 DISKUS: 500-50 | 30 days supply | Qty: 60 | Fill #2

## 2017-02-15 MED FILL — ALBUTEROL 0.083% INHAL SOLN: (2.5 MG/3ML | 6 days supply | Qty: 75 | Fill #1

## 2017-02-15 MED FILL — VENTOLIN HFA 90 MCG INHALER: 108 (90 BAS | 17 days supply | Qty: 18 | Fill #5

## 2017-02-19 DIAGNOSIS — J449 Chronic obstructive pulmonary disease, unspecified: Secondary | ICD-10-CM | POA: Diagnosis not present

## 2017-02-20 ENCOUNTER — Encounter: Payer: Self-pay | Admitting: Family Medicine

## 2017-03-05 ENCOUNTER — Other Ambulatory Visit: Payer: Self-pay | Admitting: Pulmonary Disease

## 2017-03-05 ENCOUNTER — Other Ambulatory Visit: Payer: Self-pay | Admitting: Family Medicine

## 2017-03-05 DIAGNOSIS — J42 Unspecified chronic bronchitis: Secondary | ICD-10-CM

## 2017-03-05 MED FILL — FLUTICASONE PROP 50 MCG SPR: 50 | 30 days supply | Qty: 16 | Fill #5

## 2017-03-05 MED FILL — SPIRIVA 18 MCG CP-HANDIHALE: 18 | 30 days supply | Qty: 30 | Fill #0

## 2017-03-05 MED FILL — ALBUTEROL 0.083% INHAL SOLN: (2.5 MG/3ML | 6 days supply | Qty: 75 | Fill #2

## 2017-03-06 ENCOUNTER — Other Ambulatory Visit: Payer: Self-pay | Admitting: Pulmonary Disease

## 2017-03-06 ENCOUNTER — Other Ambulatory Visit: Payer: Self-pay | Admitting: Family Medicine

## 2017-03-06 DIAGNOSIS — J42 Unspecified chronic bronchitis: Secondary | ICD-10-CM

## 2017-03-06 MED FILL — raNITIdine HCL 150 MG TABS: 150 | 30 days supply | Qty: 30 | Fill #0

## 2017-03-06 MED FILL — ADVAIR 500/50 DISKUS: 500-50 | 30 days supply | Qty: 60 | Fill #0

## 2017-03-08 ENCOUNTER — Telehealth: Payer: Self-pay | Admitting: Pulmonary Disease

## 2017-03-08 DIAGNOSIS — J42 Unspecified chronic bronchitis: Secondary | ICD-10-CM

## 2017-03-08 MED ORDER — ALBUTEROL SULFATE HFA 108 (90 BASE) MCG/ACT IN AERS: INHALATION_SPRAY | RESPIRATORY_TRACT | 2 refills | Status: DC

## 2017-03-08 MED FILL — VENTOLIN HFA 90 MCG INHALER: 108 (90 BAS | 25 days supply | Qty: 18 | Fill #0

## 2017-03-08 NOTE — Telephone Encounter (Signed)
Rx sent to pharm  Pt aware and nothing further needed

## 2017-03-11 ENCOUNTER — Encounter: Payer: Self-pay | Admitting: Pulmonary Disease

## 2017-03-11 ENCOUNTER — Ambulatory Visit (INDEPENDENT_AMBULATORY_CARE_PROVIDER_SITE_OTHER): Payer: Medicare HMO | Admitting: Pulmonary Disease

## 2017-03-11 VITALS — BP 122/76 | HR 110 | Ht 64.0 in | Wt 114.6 lb

## 2017-03-11 DIAGNOSIS — J439 Emphysema, unspecified: Secondary | ICD-10-CM

## 2017-03-11 MED ORDER — FLUTICASONE FUROATE-VILANTEROL 100-25 MCG/INH IN AEPB: 1.0000 | INHALATION_SPRAY | Freq: Every day | RESPIRATORY_TRACT | 0 refills | Status: AC

## 2017-03-11 MED ORDER — FLUTTER DEVI: 0 refills | Status: DC

## 2017-03-11 MED ORDER — PREDNISONE 10 MG PO TABS: ORAL_TABLET | ORAL | 0 refills | Status: DC

## 2017-03-11 MED ORDER — FLUTICASONE FUROATE-VILANTEROL 100-25 MCG/INH IN AEPB: 1.0000 | INHALATION_SPRAY | Freq: Every day | RESPIRATORY_TRACT | 3 refills | Status: DC

## 2017-03-11 MED ORDER — AZITHROMYCIN 250 MG PO TABS: ORAL_TABLET | ORAL | 0 refills | Status: AC

## 2017-03-11 MED FILL — AZITHROMYCIN 250 MG TABLET: 250 | 5 days supply | Qty: 6 | Fill #0

## 2017-03-11 MED FILL — predniSONE 10 MG TABS: 10 | 12 days supply | Qty: 30 | Fill #0

## 2017-03-11 NOTE — Progress Notes (Signed)
Margaret Henry    [id number]    06/20/1951  Primary Care Physician:Funches, Adriana Mccallum, MD  Referring Physician: Boykin Nearing, MD [address] [place], [place] [address]  Chief complaint:   Follow up for COPD GOLD D  HPI: Margaret Henry is a 66 year old with history of COPD GOLD D (CAT score 3, multiple exacerbations)diagnosed in 2014. Her symptoms consist mainly of shortness of breath, daily cough with whitish sputum production, wheeze, dyspnea and exertion. She does not have any hemoptysis, chest pain, palpitations. No fevers, chills.  She is currently maintained on Spiriva and Advair. She is a current smoker. She had smoked one pack per day for nearly 25 years. She is trying to quit and is down to 1 pack every week. She had used the nicotine patch in the past. 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 She does not recall any particular exposures at work or at home.  Interim history: She had an exacerbation in December which was treated with prednisone by Dr Adrian Blackwater. Today she reports progressive dyspnea with cough, tenacious sputum, wheezing. She denies any fevers, chills, hemoptysis. She continues smoking and is down to 1 cigarette per day  Outpatient Encounter Prescriptions as of 03/11/2017  Medication Sig  . acetaminophen (TYLENOL) 500 MG tablet Take 1,500 mg by mouth every 4 (four) hours as needed. For headache  . ADVAIR DISKUS 500-50 MCG/DOSE AEPB INHALE 1 PUFF INTO THE LUNGS 2 (TWO) TIMES DAILY.  Marland Kitchen albuterol (PROVENTIL) (2.5 MG/3ML) 0.083% nebulizer solution Take 3 mLs (2.5 mg total) by nebulization every 4 (four) hours as needed for wheezing or shortness of breath.  Marland Kitchen albuterol (VENTOLIN HFA) 108 (90 Base) MCG/ACT inhaler INHALE 2 PUFFS BY MOUTH INTO THE LUNGS EVERY 4 (FOUR) HOURS AS NEEDED FOR WHEEZING OR SHORTNESS OF BREATH.  Marland Kitchen aspirin EC 81 MG tablet Take 1 tablet (81 mg total) by mouth daily.  Marland Kitchen atorvastatin (LIPITOR) 40 MG tablet Take 1  tablet (40 mg total) by mouth daily.  . fluticasone (FLONASE) 50 MCG/ACT nasal spray PLACE 2 SPRAYS INTO BOTH NOSTRILS DAILY.  Marland Kitchen loratadine (CLARITIN) 10 MG tablet Take 1 tablet (10 mg total) by mouth daily.  . Omega-3 Fatty Acids (FISH OIL) 1000 MG CAPS Take 1 capsule (1,000 mg total) by mouth daily.  . Oxymetazoline HCl (SINEX LONG-ACTING NA) Place 2 sprays into the nose daily. Reported on 01/19/2016  . ranitidine (ZANTAC) 150 MG tablet TAKE 1 TABLET (150 MG TOTAL) BY MOUTH DAILY AFTER SUPPER. (30 MINS AFTER SUPPER)  . SPIRIVA HANDIHALER 18 MCG inhalation capsule PLACE 1 CAPSULE (18 MCG TOTAL) INTO INHALER AND INHALE BY MOUTH DAILY.  . [DISCONTINUED] albuterol (PROVENTIL) (2.5 MG/3ML) 0.083% nebulizer solution TAKE 3 MLS (2.5 MG TOTAL) BY NEBULIZATION EVERY 6 (SIX) HOURS AS NEEDED FOR WHEEZING OR SHORTNESS OF BREATH.  . [DISCONTINUED] buPROPion (WELLBUTRIN SR) 150 MG 12 hr tablet Take 1 tablet by mouth once daily x3 days, then increase to twice daily.  . [DISCONTINUED] nicotine (NICODERM CQ) 14 mg/24hr patch Place 1 patch (14 mg total) onto the skin daily.  . [DISCONTINUED] predniSONE (DELTASONE) 50 MG tablet Take 1 tablet (50 mg total) by mouth daily with breakfast.   No facility-administered encounter medications on file as of 03/11/2017.     Allergies as of 03/11/2017 - Review Complete 03/11/2017  Allergen Reaction Noted  . Hydrocodone Nausea And Vomiting   . Propoxyphene n-acetaminophen Nausea And Vomiting  Past Medical History:  Diagnosis Date  . Allergy    hayfever  . Bronchitis   . Cancer ([place])    skin cancer on chest  . Colon polyps   . Complication of anesthesia    pt states was given too much during nasal surgery 1989; difficulty getting awake  . COPD (chronic obstructive pulmonary disease) ([place])   . Diverticulitis   . GERD (gastroesophageal reflux disease)    occasional  . Hemorrhoids   . High cholesterol   . Osteoporosis   . Pneumonia   . Shortness of breath  dyspnea    with exertion   . Thyroid goiter    bx benign  . Vertigo     Past Surgical History:  Procedure Laterality Date  . ABDOMINAL HYSTERECTOMY    . BILATERAL OOPHORECTOMY  2001   for benign ovarian mass   . BIOPSY THYROID    . DENTAL SURGERY     dentures  . NASAL SEPTUM SURGERY    . PARTIAL HYSTERECTOMY  1976   for heavy menses   . RECTAL EXAM UNDER ANESTHESIA N/A 12/14/2015   Procedure: RECTAL EXAM UNDER ANESTHESIA REMOVAL OF ANAL CANAL MASS; INTERNAL HEMORRHOID LIGATION, EXTERNAL HEMORRHOID LIGATION;  Surgeon: Michael Boston, MD;  Location: [facility];  Service: General;  Laterality: N/A;  . TUBAL LIGATION    . WISDOM TOOTH EXTRACTION      Family History  Problem Relation Age of Onset  . Heart disease Mother   . COPD Father   . Lung cancer Maternal Grandfather   . Liver cancer Paternal Grandmother   . Colon cancer Neg Hx   . Colon polyps Neg Hx   . Esophageal cancer Neg Hx   . Rectal cancer Neg Hx   . Stomach cancer Neg Hx     Social History   Social History  . Marital status: Widowed    Spouse name: N/A  . Number of children: N/A  . Years of education: N/A   Occupational History  . Not on file.   Social History Main Topics  . Smoking status: Light Tobacco Smoker    Packs/day: 0.00    Years: 49.00    Types: Cigarettes  . Smokeless tobacco: Never Used     Comment: 1 cigarette daily-03/11/17  . Alcohol use No  . Drug use: No  . Sexual activity: Not on file   Other Topics Concern  . Not on file   Social History Narrative  . No narrative on file   Review of systems: Review of Systems  Constitutional: Negative for fever and chills.  HENT: Negative.   Eyes: Negative for blurred vision.  Respiratory: as per HPI  Cardiovascular: Negative for chest pain and palpitations.  Gastrointestinal: Negative for vomiting, diarrhea, blood per rectum. Genitourinary: Negative for dysuria, urgency, frequency and hematuria.  Musculoskeletal: Negative for myalgias, back  pain and joint pain.  Skin: Negative for itching and rash.  Neurological: Negative for dizziness, tremors, focal weakness, seizures and loss of consciousness.  Endo/Heme/Allergies: Negative for environmental allergies.  Psychiatric/Behavioral: Negative for depression, suicidal ideas and hallucinations.  All other systems reviewed and are negative.   Physical Exam: Blood pressure 122/76, pulse (!) 110, height 5\' 4"  (1.626 m), weight 114 lb 9.6 oz (52 kg), SpO2 90 %. Gen:      No acute distress HEENT:  EOMI, sclera anicteric Neck:     No masses; no thyromegaly Lungs:    Clear to auscultation bilaterally; normal respiratory effort CV:  Regular rate and rhythm; no murmurs Abd:      + bowel sounds; soft, non-tender; no palpable masses, no distension Ext:    No edema; adequate peripheral perfusion Skin:      Warm and dry; no rash Neuro: alert and oriented x 3 Psych: normal mood and affect  Data Reviewed: CXR (05/06/15) Hyperinflation of the lungs suggesting chronic obstructive pulmonary disease. No acute cardiopulmonary abnormality seen.  CT 09/05/16 Rads2 category. Severe emphysema. I reviewed all images personally.  PFTs (07/21/15) FVC 2.06 66%) FEV1 0.93 (39%) F/F 45 DLCO 36% Unable complete pleth. Severe obstructive lung disease, severe diffusion impairment  Assessment:  #1 Severe COPD,Gold Stage D, Chronic bronchitis She continues on Advair and Spiriva with poor control of symptoms. I believe she'll benefit from another course of prednisone and Z-Pak. I'll change her Advair to [facility] and continue with the Spiriva. O2 levels did not drop with ambulation.  I have asked her to use mucinex OTC and flutter valve for clearance of secretions.  #2 Current smoker. Smoking cessation was re discussed with her. She wants to try to quit on her using the nicotine patch. She's already been successful cutting down considerably on the number of cigarettes. She did not tolerate the  Chantix due to diarrhea. `  Plan/Recommendations: - Z pack, pred taper - Continue spiriva, change advair to breo - Mucinex and flutter valve.  - Smoking cessation.  Marshell Garfinkel MD [facility] Pager [phone] 03/11/2017, 3:02 PM  CC: Boykin Nearing, MD

## 2017-03-11 NOTE — Patient Instructions (Signed)
We will give a course of z pack to treat bronchitis Will continue prednisone starting at 40 mg. Reduce dose by 10 mg every 3 days Will continue on the Spiriva We will change advair to breo 100/25 Please use Mucinex over-the-counter and flutter valve for clearance of secretions  Return to clinic in 3 months.

## 2017-03-21 ENCOUNTER — Other Ambulatory Visit: Payer: Self-pay | Admitting: Family Medicine

## 2017-03-21 MED FILL — ASPIRIN EC 81 MG TABLET: 81 | 120 days supply | Qty: 120 | Fill #2

## 2017-03-21 MED FILL — ALBUTEROL 0.083% INHAL SOLN: (2.5 MG/3ML | 6 days supply | Qty: 75 | Fill #0

## 2017-03-22 DIAGNOSIS — J449 Chronic obstructive pulmonary disease, unspecified: Secondary | ICD-10-CM | POA: Diagnosis not present

## 2017-03-22 MED FILL — BREO ELLIPTA 100-25 MCG INH: 100-25 | 30 days supply | Qty: 60 | Fill #0

## 2017-04-03 ENCOUNTER — Other Ambulatory Visit: Payer: Self-pay | Admitting: Pulmonary Disease

## 2017-04-03 MED FILL — ALBUTEROL 0.083% INHAL SOLN: (2.5 MG/3ML | 6 days supply | Qty: 75 | Fill #1

## 2017-04-03 MED FILL — FLUTICASONE PROP 50 MCG SPR: 50 | 30 days supply | Qty: 16 | Fill #0

## 2017-04-03 MED FILL — VENTOLIN HFA 90 MCG INHALER: 108 (90 BAS | 25 days supply | Qty: 18 | Fill #1

## 2017-04-03 MED FILL — SPIRIVA 18 MCG CP-HANDIHALE: 18 | 30 days supply | Qty: 30 | Fill #1

## 2017-04-11 ENCOUNTER — Ambulatory Visit: Payer: Medicare HMO | Admitting: Family Medicine

## 2017-04-18 ENCOUNTER — Other Ambulatory Visit: Payer: Self-pay

## 2017-04-18 ENCOUNTER — Encounter: Payer: Self-pay | Admitting: Family Medicine

## 2017-04-18 ENCOUNTER — Ambulatory Visit: Payer: Medicare HMO | Attending: Family Medicine | Admitting: Family Medicine

## 2017-04-18 VITALS — BP 128/69 | HR 97 | Temp 98.0°F | Ht 64.0 in | Wt 116.2 lb

## 2017-04-18 DIAGNOSIS — K068 Other specified disorders of gingiva and edentulous alveolar ridge: Secondary | ICD-10-CM

## 2017-04-18 DIAGNOSIS — F17210 Nicotine dependence, cigarettes, uncomplicated: Secondary | ICD-10-CM | POA: Insufficient documentation

## 2017-04-18 DIAGNOSIS — R42 Dizziness and giddiness: Secondary | ICD-10-CM

## 2017-04-18 DIAGNOSIS — I2510 Atherosclerotic heart disease of native coronary artery without angina pectoris: Secondary | ICD-10-CM

## 2017-04-18 DIAGNOSIS — K219 Gastro-esophageal reflux disease without esophagitis: Secondary | ICD-10-CM | POA: Diagnosis not present

## 2017-04-18 DIAGNOSIS — E7800 Pure hypercholesterolemia, unspecified: Secondary | ICD-10-CM

## 2017-04-18 DIAGNOSIS — J449 Chronic obstructive pulmonary disease, unspecified: Secondary | ICD-10-CM | POA: Diagnosis not present

## 2017-04-18 DIAGNOSIS — R079 Chest pain, unspecified: Secondary | ICD-10-CM | POA: Diagnosis not present

## 2017-04-18 DIAGNOSIS — E785 Hyperlipidemia, unspecified: Secondary | ICD-10-CM

## 2017-04-18 DIAGNOSIS — J438 Other emphysema: Secondary | ICD-10-CM

## 2017-04-18 DIAGNOSIS — J441 Chronic obstructive pulmonary disease with (acute) exacerbation: Secondary | ICD-10-CM

## 2017-04-18 MED ORDER — PREDNISONE 50 MG PO TABS: 50.0000 mg | ORAL_TABLET | Freq: Every day | ORAL | 0 refills | Status: AC

## 2017-04-18 MED ORDER — MECLIZINE HCL 25 MG PO TABS: 25.0000 mg | ORAL_TABLET | Freq: Three times a day (TID) | ORAL | 0 refills | Status: DC | PRN

## 2017-04-18 MED ORDER — RANITIDINE HCL 300 MG PO TABS: 300.0000 mg | ORAL_TABLET | Freq: Every day | ORAL | 5 refills | Status: DC

## 2017-04-18 MED FILL — raNITIdine HCL 300 MG TABS: 300 | 30 days supply | Qty: 30 | Fill #0

## 2017-04-18 MED FILL — MECLIZINE 25 MG TABLET: 25 | 6 days supply | Qty: 20 | Fill #0

## 2017-04-18 MED FILL — predniSONE 50 MG TABS: 50 | 5 days supply | Qty: 5 | Fill #0

## 2017-04-18 NOTE — Progress Notes (Signed)
Subjective:  Patient ID: Margaret Henry, female    DOB: 01-13-1951  Age: 66 y.o. MRN: [id number]  CC: COPD   HPI Margaret Henry has COPD, CAD, current smoker she presents for   1.COPD: she is smoking about 4 cigs per day. She has shortness of breath all of the time. She reports a few episode of L sided chest pain, sharp, non radiating, occurs at rest. Not responsive to Tums. She has tried [place] but reports it is not controlling her shortness of breath as much as the Advair did. She reports increased productive cough.   2. Vertigo: she had 2 episodes of vertigo in the last 2 weeks. She was able to stop the dizziness by adjusting her head. She has had vertigo in the past. She denies fever, headache, ringing in ears, weakness and numbness.   Social History  Substance Use Topics  . Smoking status: Light Tobacco Smoker    Packs/day: 0.00    Years: 49.00    Types: Cigarettes  . Smokeless tobacco: Never Used     Comment: 1 cigarette daily-03/11/17  . Alcohol use No    Outpatient Medications Prior to Visit  Medication Sig Dispense Refill  . acetaminophen (TYLENOL) 500 MG tablet Take 1,500 mg by mouth every 4 (four) hours as needed. For headache    . albuterol (PROVENTIL) (2.5 MG/3ML) 0.083% nebulizer solution Take 3 mLs (2.5 mg total) by nebulization every 4 (four) hours as needed for wheezing or shortness of breath. 75 mL 2  . albuterol (PROVENTIL) (2.5 MG/3ML) 0.083% nebulizer solution TAKE 3 MLS (2.5 MG TOTAL) BY NEBULIZATION EVERY 6 (SIX) HOURS AS NEEDED FOR WHEEZING OR SHORTNESS OF BREATH. 75 mL 2  . albuterol (VENTOLIN HFA) 108 (90 Base) MCG/ACT inhaler INHALE 2 PUFFS BY MOUTH INTO THE LUNGS EVERY 4 (FOUR) HOURS AS NEEDED FOR WHEEZING OR SHORTNESS OF BREATH. 1 Inhaler 2  . aspirin EC 81 MG tablet Take 1 tablet (81 mg total) by mouth daily. 90 tablet 3  . atorvastatin (LIPITOR) 40 MG tablet Take 1 tablet (40 mg total) by mouth daily. 90 tablet 3  . fluticasone (FLONASE) 50 MCG/ACT nasal  spray PLACE 2 SPRAYS INTO BOTH NOSTRILS DAILY. 16 g 5  . fluticasone furoate-vilanterol (BREO ELLIPTA) 100-25 MCG/INH AEPB Inhale 1 puff into the lungs daily. 28 each 3  . loratadine (CLARITIN) 10 MG tablet Take 1 tablet (10 mg total) by mouth daily. 30 tablet 11  . Omega-3 Fatty Acids (FISH OIL) 1000 MG CAPS Take 1 capsule (1,000 mg total) by mouth daily. 30 capsule 6  . ranitidine (ZANTAC) 150 MG tablet TAKE 1 TABLET (150 MG TOTAL) BY MOUTH DAILY AFTER SUPPER. (30 MINS AFTER SUPPER) 30 tablet 2  . Respiratory Therapy Supplies (FLUTTER) DEVI USE AS DIRECTED 1 each 0  . SPIRIVA HANDIHALER 18 MCG inhalation capsule PLACE 1 CAPSULE (18 MCG TOTAL) INTO INHALER AND INHALE BY MOUTH DAILY. 30 capsule 2  . ADVAIR DISKUS 500-50 MCG/DOSE AEPB INHALE 1 PUFF INTO THE LUNGS 2 (TWO) TIMES DAILY. (Patient not taking: Reported on 04/18/2017) 60 each 2  . Oxymetazoline HCl (SINEX LONG-ACTING NA) Place 2 sprays into the nose daily. Reported on 01/19/2016    . predniSONE (DELTASONE) 10 MG tablet 4 tabs x 3 days, 3 tabs x 3 days, 2 tabs x 3 days , 1 tab x 3 days then stop (Patient not taking: Reported on 04/18/2017) 30 tablet 0   No facility-administered medications prior to visit.  ROS Review of Systems  Constitutional: Negative for chills and fever.  Eyes: Negative for visual disturbance.  Respiratory: Positive for cough (productive first thing in AM ) and shortness of breath.   Cardiovascular: Positive for chest pain.  Gastrointestinal: Negative for abdominal pain, blood in stool, constipation, diarrhea, nausea and vomiting.  Endocrine: Negative for cold intolerance.  Musculoskeletal: Positive for arthralgias, back pain and joint swelling.  Skin: Negative for rash.  Allergic/Immunologic: Negative for immunocompromised state.  Neurological: Positive for dizziness.  Hematological: Negative for adenopathy. Does not bruise/bleed easily.  Psychiatric/Behavioral: Negative for dysphoric mood and suicidal ideas.      Objective:  BP 128/69   Pulse 97   Temp 98 F (36.7 C) (Oral)   Ht 5' 4"  (1.626 m)   Wt 116 lb 3.2 oz (52.7 kg)   SpO2 94%   BMI 19.95 kg/m   BP/Weight 04/18/2017 03/11/2017 05/15/1102  Systolic BP 159 458 592  Diastolic BP 69 76 62  Wt. (Lbs) 116.2 114.6 116.2  BMI 19.95 19.67 19.95    Physical Exam  Constitutional: She is oriented to person, place, and time. She appears well-developed and well-nourished. No distress.  HENT:  Head: Normocephalic and atraumatic.  Right Ear: Tympanic membrane, external ear and ear canal normal.  Left Ear: Tympanic membrane, external ear and ear canal normal.  Nose: Mucosal edema present. Right sinus exhibits no maxillary sinus tenderness and no frontal sinus tenderness. Left sinus exhibits no maxillary sinus tenderness and no frontal sinus tenderness.  Cardiovascular: Normal rate, regular rhythm, normal heart sounds and intact distal pulses.   Pulmonary/Chest: Effort normal and breath sounds normal.  Musculoskeletal: She exhibits no edema.       Back:  Neurological: She is alert and oriented to person, place, and time.  Negative Epley maneuver   Skin: Skin is warm and dry. No rash noted.     Psychiatric: She has a normal mood and affect.   EKG: normal sinus rhythm, left axis deviation.   Assessment & Plan:  Margaret Henry was seen today for copd.  Diagnoses and all orders for this visit:  Hyperlipidemia, unspecified hyperlipidemia type -     CMP14+EGFR -     Lipid panel; Future -     CBC  Gastroesophageal reflux disease, esophagitis presence not specified -     ranitidine (ZANTAC) 300 MG tablet; Take 1 tablet (300 mg total) by mouth daily after supper. (30 mins after supper) -     EKG 12-Lead  COPD exacerbation (HCC) -     predniSONE (DELTASONE) 50 MG tablet; Take 1 tablet (50 mg total) by mouth daily with breakfast.  Vertigo -     meclizine (ANTIVERT) 25 MG tablet; Take 1 tablet (25 mg total) by mouth 3 (three) times daily as needed  for dizziness.  Chest pain, unspecified type -     Ambulatory referral to Cardiology -     DG Chest 2 View; Future -     Brain natriuretic peptide  Lesion of gingiva -     Ambulatory referral to Dentistry  Other emphysema ([place])  Coronary arteriosclerosis   There are no diagnoses linked to this encounter.  No orders of the defined types were placed in this encounter.   Follow-up: Return in about 8 weeks (around 06/13/2017) for COPD.   Boykin Nearing MD

## 2017-04-18 NOTE — Patient Instructions (Addendum)
Margaret Henry was seen today for copd.  Diagnoses and all orders for this visit:  Hyperlipidemia, unspecified hyperlipidemia type -     CMP14+EGFR -     Lipid panel; Future -     CBC  Gastroesophageal reflux disease, esophagitis presence not specified -     ranitidine (ZANTAC) 300 MG tablet; Take 1 tablet (300 mg total) by mouth daily after supper. (30 mins after supper) -     EKG 12-Lead  COPD exacerbation (HCC) -     predniSONE (DELTASONE) 50 MG tablet; Take 1 tablet (50 mg total) by mouth daily with breakfast.  Vertigo -     meclizine (ANTIVERT) 25 MG tablet; Take 1 tablet (25 mg total) by mouth 3 (three) times daily as needed for dizziness.  Chest pain, unspecified type -     Ambulatory referral to Cardiology -     DG Chest 2 View; Future   Your EKG reveals a normal rhythm today there is a new finding of left axis deviation that is seen with the left side of the heart enlarges. This is often seen in high blood pressure. Your BP is normal.  I would like you to get a chest x-ray to check heart size  I would like you to touch bases with your cardiologist  as a stress test to check your heart arteries should be considered.   You may take  meclizine sparingly only if changing head position does not resolve dizziness associated with vertigo   F/u in 6 weeks for COPD   Dr. Adrian Blackwater

## 2017-04-19 LAB — CMP14+EGFR
A/G RATIO: 1.9 (ref 1.2–2.2)
ALT: 10 IU/L (ref 0–32)
AST: 14 IU/L (ref 0–40)
Albumin: 4.8 g/dL (ref 3.6–4.8)
Alkaline Phosphatase: 76 IU/L (ref 39–117)
BILIRUBIN TOTAL: 0.4 mg/dL (ref 0.0–1.2)
BUN/Creatinine Ratio: 22 (ref 12–28)
BUN: 13 mg/dL (ref 8–27)
CHLORIDE: 105 mmol/L (ref 96–106)
CO2: 24 mmol/L (ref 20–29)
Calcium: 9.7 mg/dL (ref 8.7–10.3)
Creatinine, Ser: 0.59 mg/dL (ref 0.57–1.00)
GFR calc non Af Amer: 96 mL/min/{1.73_m2} (ref >59)
GFR, EST AFRICAN AMERICAN: 110 mL/min/{1.73_m2} (ref >59)
GLOBULIN, TOTAL: 2.5 g/dL (ref 1.5–4.5)
Glucose: 89 mg/dL (ref 65–99)
POTASSIUM: 4.7 mmol/L (ref 3.5–5.2)
SODIUM: 145 mmol/L — AB (ref 134–144)
TOTAL PROTEIN: 7.3 g/dL (ref 6.0–8.5)

## 2017-04-19 LAB — CBC
Hematocrit: 43.3 % (ref 34.0–46.6)
Hemoglobin: 14.6 g/dL (ref 11.1–15.9)
MCH: 32.6 pg (ref 26.6–33.0)
MCHC: 33.7 g/dL (ref 31.5–35.7)
MCV: 97 fL (ref 79–97)
PLATELETS: 308 10*3/uL (ref 150–379)
RBC: 4.48 x10E6/uL (ref 3.77–5.28)
RDW: 13.7 % (ref 12.3–15.4)
WBC: 10 10*3/uL (ref 3.4–10.8)

## 2017-04-21 DIAGNOSIS — R42 Dizziness and giddiness: Secondary | ICD-10-CM | POA: Insufficient documentation

## 2017-04-21 DIAGNOSIS — J449 Chronic obstructive pulmonary disease, unspecified: Secondary | ICD-10-CM | POA: Diagnosis not present

## 2017-04-21 HISTORY — DX: Dizziness and giddiness: R42

## 2017-04-21 MED ORDER — ASPIRIN EC 81 MG PO TBEC: 81.0000 mg | DELAYED_RELEASE_TABLET | Freq: Every day | ORAL | 3 refills | Status: DC

## 2017-04-21 MED ORDER — ATORVASTATIN CALCIUM 40 MG PO TABS: 40.0000 mg | ORAL_TABLET | Freq: Every day | ORAL | 3 refills | Status: DC

## 2017-04-21 NOTE — Assessment & Plan Note (Signed)
Chronic COPD Followed by pulmonology Reports breo is not controlling symptoms Has increased productive cough  Plan: Prednisone burst 50 mg daily for 5 days

## 2017-04-21 NOTE — Assessment & Plan Note (Signed)
Known CAD Taking aspirin and statin Recent chest pain episodes Left axis deviation on EKG without elevated BP  Plan: Chest x-ray Cardiology referral

## 2017-04-21 NOTE — Addendum Note (Signed)
Addended by: Boykin Nearing on: 04/21/2017 04:39 PM   Modules accepted: Orders

## 2017-04-21 NOTE — Assessment & Plan Note (Signed)
Vertigo No evidence of ear infection Meclizine prescribed

## 2017-04-21 NOTE — Assessment & Plan Note (Signed)
Recent chest pains  Has history of GERD Increase zantac dose to 300 mg nightly

## 2017-04-22 MED FILL — ATORVASTATIN 40 MG TABLET: 40 | 90 days supply | Qty: 90 | Fill #0

## 2017-04-22 MED FILL — ALBUTEROL 0.083% INHAL SOLN: (2.5 MG/3ML | 6 days supply | Qty: 75 | Fill #2

## 2017-04-23 ENCOUNTER — Telehealth: Payer: Self-pay

## 2017-04-23 ENCOUNTER — Ambulatory Visit (HOSPITAL_BASED_OUTPATIENT_CLINIC_OR_DEPARTMENT_OTHER)
Admission: RE | Admit: 2017-04-23 | Discharge: 2017-04-23 | Disposition: A | Discharge status: Home / Self Care | Payer: Medicare HMO | Source: Ambulatory Visit | Attending: Family Medicine | Admitting: Family Medicine

## 2017-04-23 DIAGNOSIS — R079 Chest pain, unspecified: Secondary | ICD-10-CM | POA: Insufficient documentation

## 2017-04-23 DIAGNOSIS — R0602 Shortness of breath: Secondary | ICD-10-CM | POA: Diagnosis not present

## 2017-04-23 NOTE — Telephone Encounter (Signed)
Pt. Returned nurse call regarding her results. Please f/u

## 2017-04-23 NOTE — Telephone Encounter (Signed)
Pt was called and informed of lab results. 

## 2017-04-23 NOTE — Telephone Encounter (Signed)
Pt was called and a VM was left informing pt to return phone call for lab results. If pt returns phone call please inform pt:   Cbc normal  Metabolic panel except for slightly elevated sodium levels  Be sure to stay well hydrated

## 2017-04-24 NOTE — Telephone Encounter (Signed)
error 

## 2017-04-25 ENCOUNTER — Other Ambulatory Visit: Payer: Self-pay | Admitting: Acute Care

## 2017-04-25 MED FILL — BREO ELLIPTA 100-25 MCG INH: 100-25 | 30 days supply | Qty: 60 | Fill #1

## 2017-04-25 MED FILL — VENTOLIN HFA 90 MCG INHALER: 108 (90 BAS | 25 days supply | Qty: 18 | Fill #2

## 2017-04-25 MED FILL — ASPIR-LOW EC 81 MG TABLET: 81 | 90 days supply | Qty: 90 | Fill #0

## 2017-04-26 MED FILL — LORATADINE 10 MG TABLET: 10 | 100 days supply | Qty: 100 | Fill #0

## 2017-05-02 ENCOUNTER — Encounter: Payer: Self-pay | Admitting: Physician Assistant

## 2017-05-02 ENCOUNTER — Other Ambulatory Visit: Payer: Self-pay | Admitting: Family Medicine

## 2017-05-02 ENCOUNTER — Ambulatory Visit (INDEPENDENT_AMBULATORY_CARE_PROVIDER_SITE_OTHER): Payer: Medicare HMO | Admitting: Physician Assistant

## 2017-05-02 VITALS — BP 120/60 | HR 106 | Ht 64.0 in | Wt 116.4 lb

## 2017-05-02 DIAGNOSIS — R0600 Dyspnea, unspecified: Secondary | ICD-10-CM

## 2017-05-02 DIAGNOSIS — I2510 Atherosclerotic heart disease of native coronary artery without angina pectoris: Secondary | ICD-10-CM

## 2017-05-02 DIAGNOSIS — F17200 Nicotine dependence, unspecified, uncomplicated: Secondary | ICD-10-CM

## 2017-05-02 DIAGNOSIS — R072 Precordial pain: Secondary | ICD-10-CM | POA: Diagnosis not present

## 2017-05-02 MED FILL — SPIRIVA 18 MCG CP-HANDIHALE: 18 | 30 days supply | Qty: 30 | Fill #2

## 2017-05-02 MED FILL — ALBUTEROL 0.083% INHAL SOLN: (2.5 MG/3ML | 6 days supply | Qty: 75 | Fill #0

## 2017-05-02 NOTE — Patient Instructions (Signed)
Medication Instructions:  Your physician recommends that you continue on your current medications as directed. Please refer to the Current Medication list given to you today.   Labwork: -None  Testing/Procedures: Your physician has requested that you have an echocardiogram. Echocardiography is a painless test that uses sound waves to create images of your heart. It provides your doctor with information about the size and shape of your heart and how well your heart's chambers and valves are working. This procedure takes approximately one hour. There are no restrictions for this procedure.    Follow-Up: Your physician recommends that you keep your scheduled follow-up appointment in November with Dr. Meda Coffee.    Any Other Special Instructions Will Be Listed Below (If Applicable).     If you need a refill on your cardiac medications before your next appointment, please call your pharmacy.

## 2017-05-02 NOTE — Progress Notes (Signed)
Cardiology Office Note    Date:  05/02/2017   ID:  Margaret Henry, DOB 1951/05/04, MRN [id number]  PCP:  Boykin Nearing, MD  Cardiologist:   No chief complaint on file.   History of Present Illness:  Margaret Henry is a 66 y.o. female who is being seen today for the evaluation of chest pain at the request of Funches, Adriana Mccallum, MD.  She has a greater than 50-pack-year history of smoking, hyperlipidemia and COPD. Chest CT for lung cancer showed calcification in all 3 coronary arteries as well as upper borderline size of ascending aorta and severe diffuse calcifications of the aortic arch and descending thoracic aorta. CT was reviewed by Dr. Meda Coffee felt her ascending aorta was within normal limits. She underwent nuclear stress testing 06/2016 that was negative for ischemia or prior infarction. Because of evidence of coronary calcification elevated lipids the patient was started on atorvastatin 40 mg daily.  Mother died at 54 MI, father MI 40's,CABG in 12's. She continues to smoke 3-4 cigarettes a day. She said she can't make her bed without getting short of breath. She is complaining of a lot of indigestion recently described as chest tightness but eases with Tums. She does not want to pursue any further stress testing. There was also concern for left axis deviation on EKG but reviewing prior EKGs this has been present.  Past Medical History:  Diagnosis Date  . Allergy    hayfever  . Bronchitis   . Cancer ([place])    skin cancer on chest  . Colon polyps   . Complication of anesthesia    pt states was given too much during nasal surgery 1989; difficulty getting awake  . COPD (chronic obstructive pulmonary disease) ([place])   . Diverticulitis   . GERD (gastroesophageal reflux disease)    occasional  . Hemorrhoids   . High cholesterol   . Osteoporosis   . Pneumonia   . Shortness of breath dyspnea    with exertion   . Thyroid goiter    bx benign  . Vertigo     Past Surgical History:    Procedure Laterality Date  . ABDOMINAL HYSTERECTOMY    . BILATERAL OOPHORECTOMY  2001   for benign ovarian mass   . BIOPSY THYROID    . DENTAL SURGERY     dentures  . NASAL SEPTUM SURGERY    . PARTIAL HYSTERECTOMY  1976   for heavy menses   . RECTAL EXAM UNDER ANESTHESIA N/A 12/14/2015   Procedure: RECTAL EXAM UNDER ANESTHESIA REMOVAL OF ANAL CANAL MASS; INTERNAL HEMORRHOID LIGATION, EXTERNAL HEMORRHOID LIGATION;  Surgeon: Michael Boston, MD;  Location: [facility];  Service: General;  Laterality: N/A;  . TUBAL LIGATION    . WISDOM TOOTH EXTRACTION      Current Medications: Current Meds  Medication Sig  . acetaminophen (TYLENOL) 500 MG tablet Take 1,500 mg by mouth every 4 (four) hours as needed. For headache  . albuterol (PROVENTIL) (2.5 MG/3ML) 0.083% nebulizer solution Take 3 mLs (2.5 mg total) by nebulization every 4 (four) hours as needed for wheezing or shortness of breath.  Marland Kitchen albuterol (PROVENTIL) (2.5 MG/3ML) 0.083% nebulizer solution INHALE 3 MLS (2.5 MG TOTAL) BY NEBULIZATION EVERY 6 HOURS AS NEEDED FOR WHEEZING OR SHORTNESS OF BREATH.  Marland Kitchen albuterol (VENTOLIN HFA) 108 (90 Base) MCG/ACT inhaler INHALE 2 PUFFS BY MOUTH INTO THE LUNGS EVERY 4 (FOUR) HOURS AS NEEDED FOR WHEEZING OR SHORTNESS OF BREATH.  Marland Kitchen aspirin EC 81 MG tablet Take  1 tablet (81 mg total) by mouth daily.  Marland Kitchen atorvastatin (LIPITOR) 40 MG tablet Take 1 tablet (40 mg total) by mouth daily.  . fluticasone (FLONASE) 50 MCG/ACT nasal spray PLACE 2 SPRAYS INTO BOTH NOSTRILS DAILY.  . fluticasone furoate-vilanterol (BREO ELLIPTA) 100-25 MCG/INH AEPB Inhale 1 puff into the lungs daily.  Marland Kitchen loratadine (CLARITIN) 10 MG tablet TAKE 1 TABLET (10 MG TOTAL) BY MOUTH DAILY.  . meclizine (ANTIVERT) 25 MG tablet Take 1 tablet (25 mg total) by mouth 3 (three) times daily as needed for dizziness.  . Omega-3 Fatty Acids (FISH OIL) 1000 MG CAPS Take 1 capsule (1,000 mg total) by mouth daily.  . Oxymetazoline HCl (SINEX LONG-ACTING NA) Place 2  sprays into the nose daily. Reported on 01/19/2016  . ranitidine (ZANTAC) 300 MG tablet Take 1 tablet (300 mg total) by mouth daily after supper. (30 mins after supper)  . Respiratory Therapy Supplies (FLUTTER) DEVI USE AS DIRECTED  . SPIRIVA HANDIHALER 18 MCG inhalation capsule PLACE 1 CAPSULE (18 MCG TOTAL) INTO INHALER AND INHALE BY MOUTH DAILY.     Allergies:   Hydrocodone and Propoxyphene n-acetaminophen   Social History   Social History  . Marital status: Widowed    Spouse name: N/A  . Number of children: N/A  . Years of education: N/A   Social History Main Topics  . Smoking status: Light Tobacco Smoker    Packs/day: 0.00    Years: 49.00    Types: Cigarettes  . Smokeless tobacco: Never Used     Comment: 1 cigarette daily-03/11/17  . Alcohol use No  . Drug use: No  . Sexual activity: Not Asked   Other Topics Concern  . None   Social History Narrative  . None     Family History:  The patient' family history includes COPD in her father; Heart disease in her mother; Liver cancer in her paternal grandmother; Lung cancer in her maternal grandfather.   ROS:   Please see the history of present illness.    Review of Systems  Constitution: Negative.  HENT: Negative.   Eyes: Negative.   Cardiovascular: Positive for dyspnea on exertion.  Respiratory: Positive for shortness of breath and wheezing.   Hematologic/Lymphatic: Negative.   Musculoskeletal: Negative.  Negative for joint pain.  Gastrointestinal: Positive for heartburn.  Genitourinary: Negative.   Neurological: Negative.    All other systems reviewed and are negative.   PHYSICAL EXAM:   VS:  BP 120/60 (BP Location: Right Arm)   Pulse (!) 106   Ht 5\' 4"  (1.626 m)   Wt 116 lb 6.4 oz (52.8 kg)   BMI 19.98 kg/m   Physical Exam  GEN: Thin, looks older than stated age, in no acute distress  Neck: no JVD, carotid bruits, or masses Cardiac:RRR; no murmurs, rubs, or gallops  Respiratory:  clear to auscultation  bilaterally, normal work of breathing GI: soft, nontender, nondistended, + BS Ext: without cyanosis, clubbing, or edema, Good distal pulses bilaterally Neuro:  Alert and Oriented x 3 Psych: euthymic mood, full affect  Wt Readings from Last 3 Encounters:  05/02/17 116 lb 6.4 oz (52.8 kg)  04/18/17 116 lb 3.2 oz (52.7 kg)  03/11/17 114 lb 9.6 oz (52 kg)      Studies/Labs Reviewed:   EKG:  EKG is ordered today.  The ekg ordered today demonstrates Normal sinus rhythm with left axis deviation, poor R-wave progression anteriorly, no acute change when reviewing prior tracings  Recent Labs: 04/18/2017: ALT 10; BUN  13; Creatinine, Ser 0.59; Hemoglobin 14.6; Platelets 308; Potassium 4.7; Sodium 145   Lipid Panel    Component Value Date/Time   CHOL 177 09/05/2016 1218   TRIG 194 (H) 09/05/2016 1218   HDL 57 09/05/2016 1218   CHOLHDL 3.1 09/05/2016 1218   VLDL 39 (H) 09/05/2016 1218   LDLCALC 81 09/05/2016 1218   LDLDIRECT 166 (H) 09/26/2015 1001    Additional studies/ records that were reviewed today include:    Lexi scan Myoview 9/2017The left ventricular ejection fraction is hyperdynamic (>65%).  Nuclear stress EF: 74%.  There was no ST segment deviation noted during stress.  The study is normal.  This is a low risk study.   Normal resting and stress perfusion. No ischemia or infarction EF 74%     ASSESSMENT:    1. Precordial pain   2. Dyspnea, unspecified type   3. TOBACCO ABUSE   4. Coronary arteriosclerosis      PLAN:  In order of problems listed above:  Chest pain in the setting of significant COPD. Lexi scan Myoview normal 06/2016. Patient is a smoker and has family history of early CAD. Patient does not want to pursue another stress test. Will check 2-D echo and follow-up with Dr. Meda Coffee  Dyspnea most likely secondary to COPD check 2-D echo for LV function  Tobacco abuse patient smoking 3-4 cigarettes daily. She is trying to quit.  Coronary calcification  on CT with negative stress test less than a year ago. Has repeat scan in November. Follow-up with Dr. Meda Coffee after that.    Medication Adjustments/Labs and Tests Ordered: Current medicines are reviewed at length with the patient today.  Concerns regarding medicines are outlined above.  Medication changes, Labs and Tests ordered today are listed in the Patient Instructions below. Patient Instructions  Medication Instructions:  Your physician recommends that you continue on your current medications as directed. Please refer to the Current Medication list given to you today.   Labwork: -None  Testing/Procedures: Your physician has requested that you have an echocardiogram. Echocardiography is a painless test that uses sound waves to create images of your heart. It provides your doctor with information about the size and shape of your heart and how well your heart's chambers and valves are working. This procedure takes approximately one hour. There are no restrictions for this procedure.    Follow-Up: Your physician recommends that you keep your scheduled follow-up appointment in November with Dr. Meda Coffee.    Any Other Special Instructions Will Be Listed Below (If Applicable).     If you need a refill on your cardiac medications before your next appointment, please call your pharmacy.      Sumner Boast, PA-C  05/02/2017 10:34 AM    [facility] [address], [place], [place]  [address] Phone: [phone]; Fax: [phone]

## 2017-05-07 ENCOUNTER — Ambulatory Visit (HOSPITAL_COMMUNITY): Payer: Medicare HMO | Attending: Cardiology

## 2017-05-07 ENCOUNTER — Other Ambulatory Visit: Payer: Self-pay

## 2017-05-07 DIAGNOSIS — Z8249 Family history of ischemic heart disease and other diseases of the circulatory system: Secondary | ICD-10-CM | POA: Insufficient documentation

## 2017-05-07 DIAGNOSIS — E785 Hyperlipidemia, unspecified: Secondary | ICD-10-CM | POA: Insufficient documentation

## 2017-05-07 DIAGNOSIS — J449 Chronic obstructive pulmonary disease, unspecified: Secondary | ICD-10-CM | POA: Diagnosis not present

## 2017-05-07 DIAGNOSIS — R0600 Dyspnea, unspecified: Secondary | ICD-10-CM | POA: Insufficient documentation

## 2017-05-07 DIAGNOSIS — I351 Nonrheumatic aortic (valve) insufficiency: Secondary | ICD-10-CM | POA: Diagnosis not present

## 2017-05-07 DIAGNOSIS — F17210 Nicotine dependence, cigarettes, uncomplicated: Secondary | ICD-10-CM | POA: Insufficient documentation

## 2017-05-08 ENCOUNTER — Telehealth: Payer: Self-pay | Admitting: Cardiology

## 2017-05-08 NOTE — Telephone Encounter (Signed)
-----   Message from Imogene Burn, PA-C sent at 05/08/2017  8:02 AM EDT ----- Heart function normal, some trouble relaxing and mild heart murmur. Overall looks good

## 2017-05-08 NOTE — Telephone Encounter (Signed)
New message    Patient calling back on echo test results.

## 2017-05-16 ENCOUNTER — Other Ambulatory Visit: Payer: Self-pay | Admitting: Pulmonary Disease

## 2017-05-16 DIAGNOSIS — J42 Unspecified chronic bronchitis: Secondary | ICD-10-CM

## 2017-05-16 MED FILL — FLUTICASONE PROP 50 MCG SPR: 50 | 30 days supply | Qty: 16 | Fill #1

## 2017-05-17 ENCOUNTER — Telehealth: Payer: Self-pay | Admitting: Family Medicine

## 2017-05-17 DIAGNOSIS — J42 Unspecified chronic bronchitis: Secondary | ICD-10-CM

## 2017-05-17 MED ORDER — ALBUTEROL SULFATE (2.5 MG/3ML) 0.083% IN NEBU: INHALATION_SOLUTION | RESPIRATORY_TRACT | 2 refills | Status: DC

## 2017-05-17 MED ORDER — ALBUTEROL SULFATE HFA 108 (90 BASE) MCG/ACT IN AERS: INHALATION_SPRAY | RESPIRATORY_TRACT | 2 refills | Status: DC

## 2017-05-17 MED ORDER — SPIRIVA HANDIHALER 18 MCG IN CAPS: ORAL_CAPSULE | RESPIRATORY_TRACT | 2 refills | Status: DC

## 2017-05-17 MED FILL — VENTOLIN HFA 90 MCG INHALER: 108 (90 BAS | 18 days supply | Qty: 18 | Fill #0

## 2017-05-17 MED FILL — ALBUTEROL 0.083% INHAL SOLN: (2.5 MG/3ML | 6 days supply | Qty: 75 | Fill #0

## 2017-05-17 NOTE — Telephone Encounter (Signed)
Inhalers sent to [facility] will contact patient when prescriptions are filled.

## 2017-05-17 NOTE — Telephone Encounter (Signed)
Patient called stated need inhaler refilled today. Stated can not go outside without medication.  Please send to [facility].  Please follow up with patient ph: [phone]

## 2017-05-22 DIAGNOSIS — J449 Chronic obstructive pulmonary disease, unspecified: Secondary | ICD-10-CM | POA: Diagnosis not present

## 2017-05-27 MED FILL — BREO ELLIPTA 100-25 MCG INH: 100-25 | 30 days supply | Qty: 60 | Fill #2

## 2017-05-27 MED FILL — raNITIdine HCL 300 MG TABS: 300 | 30 days supply | Qty: 30 | Fill #1

## 2017-05-30 ENCOUNTER — Ambulatory Visit: Payer: Medicare HMO | Attending: Family Medicine | Admitting: Family Medicine

## 2017-05-30 ENCOUNTER — Encounter: Payer: Self-pay | Admitting: Family Medicine

## 2017-05-30 VITALS — BP 118/73 | HR 111 | Temp 98.0°F | Ht 64.0 in | Wt 116.4 lb

## 2017-05-30 DIAGNOSIS — Z7982 Long term (current) use of aspirin: Secondary | ICD-10-CM | POA: Diagnosis not present

## 2017-05-30 DIAGNOSIS — R6881 Early satiety: Secondary | ICD-10-CM | POA: Diagnosis not present

## 2017-05-30 DIAGNOSIS — K219 Gastro-esophageal reflux disease without esophagitis: Secondary | ICD-10-CM | POA: Diagnosis not present

## 2017-05-30 DIAGNOSIS — J439 Emphysema, unspecified: Secondary | ICD-10-CM | POA: Insufficient documentation

## 2017-05-30 DIAGNOSIS — F17200 Nicotine dependence, unspecified, uncomplicated: Secondary | ICD-10-CM | POA: Diagnosis not present

## 2017-05-30 DIAGNOSIS — J449 Chronic obstructive pulmonary disease, unspecified: Secondary | ICD-10-CM | POA: Insufficient documentation

## 2017-05-30 DIAGNOSIS — E7800 Pure hypercholesterolemia, unspecified: Secondary | ICD-10-CM | POA: Insufficient documentation

## 2017-05-30 DIAGNOSIS — Z720 Tobacco use: Secondary | ICD-10-CM | POA: Diagnosis not present

## 2017-05-30 DIAGNOSIS — Z86010 Personal history of colonic polyps: Secondary | ICD-10-CM | POA: Diagnosis not present

## 2017-05-30 DIAGNOSIS — J438 Other emphysema: Secondary | ICD-10-CM

## 2017-05-30 DIAGNOSIS — B351 Tinea unguium: Secondary | ICD-10-CM | POA: Diagnosis not present

## 2017-05-30 DIAGNOSIS — Z85828 Personal history of other malignant neoplasm of skin: Secondary | ICD-10-CM | POA: Diagnosis not present

## 2017-05-30 NOTE — Patient Instructions (Signed)

## 2017-05-30 NOTE — Progress Notes (Signed)
Subjective:  Patient ID: Margaret Henry, female    DOB: November 27, 1950  Age: 66 y.o. MRN: [id number]  CC: COPD   HPI KELLINA DREESE is a 66 year old woman with a history of COPD, tobacco abuse, GERD who presents today to get established with me. She was previously followed by Dr Adrian Blackwater.  She complains of right big toenail pain and separation of the nail. Previously she has purulent discharge for which she received an antibiotic and antifungal;  denies discharge now.  Reflux symptoms are controlled.  She complains of shortness of breath and having to use her bronchodilator every 4 hours despite using her Spiriva and Advair. Shortness of breath is worse after meals even if she eats few bites. She smokes 3 cigarrettes/ day.  2d echo from 04/2017 revealed EF of 65 - 70%. Denies pedal edema, orthopnea. She has an upcoming appointment with Pulmonary on 9/14. She had a cecal  polyp identified during colonoscopy; anal lesion resected by Gen Surgery in 12/2015 - pathology - condyloma and hemorrhoids  Past Medical History:  Diagnosis Date  . Allergy    hayfever  . Bronchitis   . Cancer ([place])    skin cancer on chest  . Colon polyps   . Complication of anesthesia    pt states was given too much during nasal surgery 1989; difficulty getting awake  . COPD (chronic obstructive pulmonary disease) ([place])   . Diverticulitis   . GERD (gastroesophageal reflux disease)    occasional  . Hemorrhoids   . High cholesterol   . Osteoporosis   . Pneumonia   . Shortness of breath dyspnea    with exertion   . Thyroid goiter    bx benign  . Vertigo     Past Surgical History:  Procedure Laterality Date  . ABDOMINAL HYSTERECTOMY    . BILATERAL OOPHORECTOMY  2001   for benign ovarian mass   . BIOPSY THYROID    . DENTAL SURGERY     dentures  . NASAL SEPTUM SURGERY    . PARTIAL HYSTERECTOMY  1976   for heavy menses   . RECTAL EXAM UNDER ANESTHESIA N/A 12/14/2015   Procedure: RECTAL EXAM UNDER  ANESTHESIA REMOVAL OF ANAL CANAL MASS; INTERNAL HEMORRHOID LIGATION, EXTERNAL HEMORRHOID LIGATION;  Surgeon: Michael Boston, MD;  Location: [facility];  Service: General;  Laterality: N/A;  . TUBAL LIGATION    . WISDOM TOOTH EXTRACTION      Allergies  Allergen Reactions  . Hydrocodone Nausea And Vomiting  . Propoxyphene N-Acetaminophen Nausea And Vomiting    Outpatient Medications Prior to Visit  Medication Sig Dispense Refill  . acetaminophen (TYLENOL) 500 MG tablet Take 1,500 mg by mouth every 4 (four) hours as needed. For headache    . albuterol (PROVENTIL) (2.5 MG/3ML) 0.083% nebulizer solution INHALE 3 MLS (2.5 MG TOTAL) BY NEBULIZATION EVERY 6 HOURS AS NEEDED FOR WHEEZING OR SHORTNESS OF BREATH. 75 mL 2  . albuterol (VENTOLIN HFA) 108 (90 Base) MCG/ACT inhaler INHALE 2 PUFFS BY MOUTH INTO THE LUNGS EVERY 4 (FOUR) HOURS AS NEEDED FOR WHEEZING OR SHORTNESS OF BREATH. 18 g 2  . aspirin EC 81 MG tablet Take 1 tablet (81 mg total) by mouth daily. 90 tablet 3  . atorvastatin (LIPITOR) 40 MG tablet Take 1 tablet (40 mg total) by mouth daily. 90 tablet 3  . fluticasone (FLONASE) 50 MCG/ACT nasal spray PLACE 2 SPRAYS INTO BOTH NOSTRILS DAILY. 16 g 5  . fluticasone furoate-vilanterol (BREO ELLIPTA) 100-25  MCG/INH AEPB Inhale 1 puff into the lungs daily. 28 each 3  . loratadine (CLARITIN) 10 MG tablet TAKE 1 TABLET (10 MG TOTAL) BY MOUTH DAILY. 30 tablet 11  . meclizine (ANTIVERT) 25 MG tablet Take 1 tablet (25 mg total) by mouth 3 (three) times daily as needed for dizziness. 20 tablet 0  . Omega-3 Fatty Acids (FISH OIL) 1000 MG CAPS Take 1 capsule (1,000 mg total) by mouth daily. 30 capsule 6  . Oxymetazoline HCl (SINEX LONG-ACTING NA) Place 2 sprays into the nose daily. Reported on 01/19/2016    . ranitidine (ZANTAC) 300 MG tablet Take 1 tablet (300 mg total) by mouth daily after supper. (30 mins after supper) 30 tablet 5  . Respiratory Therapy Supplies (FLUTTER) DEVI USE AS DIRECTED 1 each 0  .  SPIRIVA HANDIHALER 18 MCG inhalation capsule PLACE 1 CAPSULE (18 MCG TOTAL) INTO INHALER AND INHALE BY MOUTH DAILY. 30 capsule 2   No facility-administered medications prior to visit.     ROS Review of Systems  Constitutional: Negative for activity change, appetite change and fatigue.  HENT: Negative for congestion, sinus pressure and sore throat.   Eyes: Negative for visual disturbance.  Respiratory: Positive for shortness of breath. Negative for cough, chest tightness and wheezing.   Cardiovascular: Negative for chest pain and palpitations.  Gastrointestinal: Negative for abdominal distention, abdominal pain and constipation.  Endocrine: Negative for polydipsia.  Genitourinary: Negative for dysuria and frequency.  Musculoskeletal: Negative for arthralgias and back pain.  Skin: Negative for rash.  Neurological: Negative for tremors, light-headedness and numbness.  Hematological: Does not bruise/bleed easily.  Psychiatric/Behavioral: Negative for agitation and behavioral problems.    Objective:  BP 118/73   Pulse (!) 111   Temp 98 F (36.7 C) (Oral)   Ht 5\' 4"  (1.626 m)   Wt 116 lb 6.4 oz (52.8 kg)   SpO2 94%   BMI 19.98 kg/m   BP/Weight 05/30/2017 05/02/2017 5/78/4696  Systolic BP 295 284 132  Diastolic BP 73 60 69  Wt. (Lbs) 116.4 116.4 116.2  BMI 19.98 19.98 19.95      Physical Exam  Constitutional: She is oriented to person, place, and time. She appears well-developed and well-nourished.  Cardiovascular: Normal rate, normal heart sounds and intact distal pulses.   No murmur heard. Pulmonary/Chest: Effort normal and breath sounds normal. She has no wheezes. She has no rales. She exhibits no tenderness.  Abdominal: Soft. Bowel sounds are normal. She exhibits no distension and no mass. There is no tenderness.  Musculoskeletal: Normal range of motion.  Neurological: She is alert and oriented to person, place, and time.  Skin:  Dystrophic right big toenail with  separation of the nail bed     Assessment & Plan:   1. Gastroesophageal reflux disease, esophagitis presence not specified Controlled  2. Other emphysema ([place]) Uncontrolled with frequent need for bronchodilator Dyspnea also exacerbated by meals Advised that ongoing tobacco abuse could be worsening symptoms Continue medication Keep appointment with Pulmonary  3. Onychomycosis Used antifungal in the recent past with no improvement - Ambulatory referral to Podiatry  4. Early satiety Advised to eat frequent small portions We'll consider GI referral for upper endoscopy at next visit if symptoms persist  5. Tobacco abuse Discussed cessation and she is not ready to quit No orders of the defined types were placed in this encounter.   Follow-up: Return in about 1 month (around 06/30/2017) for Follow-up in early satiety.   Arnoldo Morale MD

## 2017-06-04 MED FILL — SPIRIVA 18 MCG CP-HANDIHALE: 18 | 30 days supply | Qty: 30 | Fill #0

## 2017-06-04 MED FILL — VENTOLIN HFA 90 MCG INHALER: 108 (90 BAS | 18 days supply | Qty: 18 | Fill #1

## 2017-06-05 MED FILL — ALBUTEROL 0.083% INHAL SOLN: (2.5 MG/3ML | 6 days supply | Qty: 75 | Fill #1

## 2017-06-11 ENCOUNTER — Encounter: Payer: Self-pay | Admitting: Pulmonary Disease

## 2017-06-11 ENCOUNTER — Ambulatory Visit (INDEPENDENT_AMBULATORY_CARE_PROVIDER_SITE_OTHER): Payer: Medicare HMO | Admitting: Pulmonary Disease

## 2017-06-11 VITALS — BP 106/60 | Ht 64.0 in | Wt 115.8 lb

## 2017-06-11 DIAGNOSIS — J449 Chronic obstructive pulmonary disease, unspecified: Secondary | ICD-10-CM

## 2017-06-11 MED ORDER — ROFLUMILAST 500 MCG PO TABS: 500.0000 ug | ORAL_TABLET | Freq: Every day | ORAL | 11 refills | Status: DC

## 2017-06-11 MED ORDER — ROFLUMILAST 250 MCG PO TABS: 1.0000 | ORAL_TABLET | ORAL | 0 refills | Status: DC

## 2017-06-11 NOTE — Progress Notes (Signed)
LARRI BREWTON    [id number]    01-Jun-1951  Primary Care Physician:Amao, Charlane Ferretti, MD  Referring Physician: Boykin Nearing, MD [address] [place], [place] [address]  Chief complaint:   Follow up for COPD GOLD D  HPI: [name] is a 66 year old with history of COPD GOLD D (CAT score 3, multiple exacerbations)diagnosed in 2014. Her symptoms consist mainly of shortness of breath, daily cough with whitish sputum production, wheeze, dyspnea and exertion. She does not have any hemoptysis, chest pain, palpitations. No fevers, chills.  She is currently maintained on Spiriva and Advair. She is a current smoker. She had smoked one pack per day for nearly 25 years. She is trying to quit and is down to 1 pack every week. She had used the nicotine patch in the past. 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 She does not recall any particular exposures at work or at home.  Interim history: She has progressive dyspnea. She notes that the heat and humidity is worsening her symptoms.Has cough with mucus. She has tried Mucinex but it has not helped.  Outpatient Encounter Prescriptions as of 06/11/2017  Medication Sig  . acetaminophen (TYLENOL) 500 MG tablet Take 1,500 mg by mouth every 4 (four) hours as needed. For headache  . albuterol (PROVENTIL) (2.5 MG/3ML) 0.083% nebulizer solution INHALE 3 MLS (2.5 MG TOTAL) BY NEBULIZATION EVERY 6 HOURS AS NEEDED FOR WHEEZING OR SHORTNESS OF BREATH.  Marland Kitchen albuterol (VENTOLIN HFA) 108 (90 Base) MCG/ACT inhaler INHALE 2 PUFFS BY MOUTH INTO THE LUNGS EVERY 4 (FOUR) HOURS AS NEEDED FOR WHEEZING OR SHORTNESS OF BREATH.  Marland Kitchen aspirin EC 81 MG tablet Take 1 tablet (81 mg total) by mouth daily.  Marland Kitchen atorvastatin (LIPITOR) 40 MG tablet Take 1 tablet (40 mg total) by mouth daily.  . fluticasone (FLONASE) 50 MCG/ACT nasal spray PLACE 2 SPRAYS INTO BOTH NOSTRILS DAILY.  . fluticasone furoate-vilanterol (BREO ELLIPTA) 100-25 MCG/INH AEPB Inhale  1 puff into the lungs daily.  Marland Kitchen loratadine (CLARITIN) 10 MG tablet TAKE 1 TABLET (10 MG TOTAL) BY MOUTH DAILY.  . meclizine (ANTIVERT) 25 MG tablet Take 1 tablet (25 mg total) by mouth 3 (three) times daily as needed for dizziness.  . Omega-3 Fatty Acids (FISH OIL) 1000 MG CAPS Take 1 capsule (1,000 mg total) by mouth daily.  . Oxymetazoline HCl (SINEX LONG-ACTING NA) Place 2 sprays into the nose daily. Reported on 01/19/2016  . ranitidine (ZANTAC) 300 MG tablet Take 1 tablet (300 mg total) by mouth daily after supper. (30 mins after supper)  . Respiratory Therapy Supplies (FLUTTER) DEVI USE AS DIRECTED  . SPIRIVA HANDIHALER 18 MCG inhalation capsule PLACE 1 CAPSULE (18 MCG TOTAL) INTO INHALER AND INHALE BY MOUTH DAILY.   No facility-administered encounter medications on file as of 06/11/2017.     Allergies as of 06/11/2017 - Review Complete 06/11/2017  Allergen Reaction Noted  . Hydrocodone Nausea And Vomiting   . Propoxyphene n-acetaminophen Nausea And Vomiting     Past Medical History:  Diagnosis Date  . Allergy    hayfever  . Bronchitis   . Cancer ([place])    skin cancer on chest  . Colon polyps   . Complication of anesthesia    pt states was given too much during nasal surgery 1989; difficulty getting awake  . COPD (chronic obstructive pulmonary disease) ([place])   . Diverticulitis   . GERD (gastroesophageal reflux disease)  occasional  . Hemorrhoids   . High cholesterol   . Osteoporosis   . Pneumonia   . Shortness of breath dyspnea    with exertion   . Thyroid goiter    bx benign  . Vertigo     Past Surgical History:  Procedure Laterality Date  . ABDOMINAL HYSTERECTOMY    . BILATERAL OOPHORECTOMY  2001   for benign ovarian mass   . BIOPSY THYROID    . DENTAL SURGERY     dentures  . NASAL SEPTUM SURGERY    . PARTIAL HYSTERECTOMY  1976   for heavy menses   . RECTAL EXAM UNDER ANESTHESIA N/A 12/14/2015   Procedure: RECTAL EXAM UNDER ANESTHESIA REMOVAL OF ANAL CANAL  MASS; INTERNAL HEMORRHOID LIGATION, EXTERNAL HEMORRHOID LIGATION;  Surgeon: Michael Boston, MD;  Location: [facility];  Service: General;  Laterality: N/A;  . TUBAL LIGATION    . WISDOM TOOTH EXTRACTION      Family History  Problem Relation Age of Onset  . Heart disease Mother   . COPD Father   . Lung cancer Maternal Grandfather   . Liver cancer Paternal Grandmother   . Colon cancer Neg Hx   . Colon polyps Neg Hx   . Esophageal cancer Neg Hx   . Rectal cancer Neg Hx   . Stomach cancer Neg Hx     Social History   Social History  . Marital status: Widowed    Spouse name: N/A  . Number of children: N/A  . Years of education: N/A   Occupational History  . Not on file.   Social History Main Topics  . Smoking status: Light Tobacco Smoker    Packs/day: 0.00    Years: 49.00    Types: Cigarettes  . Smokeless tobacco: Never Used     Comment: 2 cigarette daily-06/11/17  . Alcohol use No  . Drug use: No  . Sexual activity: Not on file   Other Topics Concern  . Not on file   Social History Narrative  . No narrative on file   Review of systems: Review of Systems  Constitutional: Negative for fever and chills.  HENT: Negative.   Eyes: Negative for blurred vision.  Respiratory: as per HPI  Cardiovascular: Negative for chest pain and palpitations.  Gastrointestinal: Negative for vomiting, diarrhea, blood per rectum. Genitourinary: Negative for dysuria, urgency, frequency and hematuria.  Musculoskeletal: Negative for myalgias, back pain and joint pain.  Skin: Negative for itching and rash.  Neurological: Negative for dizziness, tremors, focal weakness, seizures and loss of consciousness.  Endo/Heme/Allergies: Negative for environmental allergies.  Psychiatric/Behavioral: Negative for depression, suicidal ideas and hallucinations.  All other systems reviewed and are negative.   Physical Exam: Blood pressure 106/60, height 5\' 4"  (1.626 m), weight 115 lb 12.8 oz (52.5 kg), SpO2 95  %. Gen:      No acute distress HEENT:  EOMI, sclera anicteric Neck:     No masses; no thyromegaly Lungs:    Reduce breath sounds; normal respiratory effort CV:         Regular rate and rhythm; no murmurs Abd:      + bowel sounds; soft, non-tender; no palpable masses, no distension Ext:    No edema; adequate peripheral perfusion Skin:      Warm and dry; no rash Neuro: alert and oriented x 3 Psych: normal mood and affect  Data Reviewed: Chest x-ray 7/17/1 8-hyperinflation, no acute abnormality.  CT 09/05/16 Rads2 category. Severe emphysema.  I have reviewed  all images personally.  PFTs (07/21/15) FVC 2.06 66%) FEV1 0.93 (39%) F/F 45 DLCO 36% Unable complete pleth. Severe obstructive lung disease, severe diffusion impairment  Spirometry 7/6/1 7 FVC 1.79 [5 7%) FEV1 0.65 (27%) F/F 36 Very severe obstructive airway disease  Assessment:  #1 Severe COPD,Gold Stage D, Chronic bronchitis Continues on Breo and Spiriva.She'll continue flutter valve for clearance of secretions and she does not like Mucinex since she feels it makes the mucous harder to cough. We'll try her on Daliresp. She was walked in her primary office last month with no desaturation. We will check again at next return visit. I discussed sending her to pulmonary rehabilitation but she is not interested.  #2 Current smoker. Smoking cessation was re discussed with her. She wants to try to quit on her using the nicotine patch. She's already been successful cutting down considerably on the number of cigarettes. She did not tolerate the Chantix due to diarrhea. `Time spent counseling- 5 mins  Plan/Recommendations: - Continue spiriva, breo - Flutter valve.  - Start daliresp - Smoking cessation.  Marshell Garfinkel MD [facility] Pager [phone] 06/11/2017, 1:32 PM  CC: Boykin Nearing, MD

## 2017-06-11 NOTE — Patient Instructions (Signed)
Continue Breo and Spiriva Will start to on Daliresp  Follow up in 6 months

## 2017-06-19 MED FILL — raNITIdine HCL 300 MG TABS: 300 | 30 days supply | Qty: 30 | Fill #2

## 2017-06-19 MED FILL — VENTOLIN HFA 90 MCG INHALER: 108 (90 BAS | 18 days supply | Qty: 18 | Fill #2

## 2017-06-19 MED FILL — ALBUTEROL 0.083% INHAL SOLN: (2.5 MG/3ML | 6 days supply | Qty: 75 | Fill #2

## 2017-06-19 MED FILL — BREO ELLIPTA 100-25 MCG INH: 100-25 | 30 days supply | Qty: 60 | Fill #3

## 2017-06-20 MED FILL — FLUTICASONE PROP 50 MCG SPR: 50 | 30 days supply | Qty: 16 | Fill #2

## 2017-06-21 ENCOUNTER — Ambulatory Visit (INDEPENDENT_AMBULATORY_CARE_PROVIDER_SITE_OTHER): Payer: Medicare HMO | Admitting: Podiatry

## 2017-06-21 ENCOUNTER — Encounter: Payer: Self-pay | Admitting: Podiatry

## 2017-06-21 VITALS — BP 105/71 | HR 113 | Resp 18

## 2017-06-21 DIAGNOSIS — B351 Tinea unguium: Secondary | ICD-10-CM

## 2017-06-21 DIAGNOSIS — L600 Ingrowing nail: Secondary | ICD-10-CM | POA: Diagnosis not present

## 2017-06-21 MED ORDER — FLUCONAZOLE 150 MG PO TABS: 150.0000 mg | ORAL_TABLET | ORAL | 1 refills | Status: DC

## 2017-06-21 MED FILL — FLUCONAZOLE 150 MG TABLET: 150 | 42 days supply | Qty: 6 | Fill #0

## 2017-06-21 NOTE — Progress Notes (Signed)
Subjective:  Patient ID: Margaret Henry, female    DOB: 05/27/1951,  MRN: [id number] HPI Chief Complaint  Patient presents with  . Nail Problem    Toenails bilateral - thick, discolored nails for several months, the past few weeks hallux nail has become sore and loose from nailbed, she has tried OTC med and soaks over the years-no help    66 y.o. female presents with the above complaint. Reports painful possibly ingrown/loose right great toenail. Has tried soaking without relief. Unsure what cause the nail to be a little loose. Never had this issue before.  Past Medical History:  Diagnosis Date  . Allergy    hayfever  . Bronchitis   . Cancer ([place])    skin cancer on chest  . Colon polyps   . Complication of anesthesia    pt states was given too much during nasal surgery 1989; difficulty getting awake  . COPD (chronic obstructive pulmonary disease) ([place])   . Diverticulitis   . GERD (gastroesophageal reflux disease)    occasional  . Hemorrhoids   . High cholesterol   . Osteoporosis   . Pneumonia   . Shortness of breath dyspnea    with exertion   . Thyroid goiter    bx benign  . Vertigo    Past Surgical History:  Procedure Laterality Date  . ABDOMINAL HYSTERECTOMY    . BILATERAL OOPHORECTOMY  2001   for benign ovarian mass   . BIOPSY THYROID    . DENTAL SURGERY     dentures  . NASAL SEPTUM SURGERY    . PARTIAL HYSTERECTOMY  1976   for heavy menses   . RECTAL EXAM UNDER ANESTHESIA N/A 12/14/2015   Procedure: RECTAL EXAM UNDER ANESTHESIA REMOVAL OF ANAL CANAL MASS; INTERNAL HEMORRHOID LIGATION, EXTERNAL HEMORRHOID LIGATION;  Surgeon: Lasheika Ortloff Boston, MD;  Location: [facility];  Service: General;  Laterality: N/A;  . TUBAL LIGATION    . WISDOM TOOTH EXTRACTION      Current Outpatient Prescriptions:  .  acetaminophen (TYLENOL) 500 MG tablet, Take 1,500 mg by mouth every 4 (four) hours as needed. For headache, Disp: , Rfl:  .  albuterol (PROVENTIL) (2.5 MG/3ML) 0.083% nebulizer  solution, INHALE 3 MLS (2.5 MG TOTAL) BY NEBULIZATION EVERY 6 HOURS AS NEEDED FOR WHEEZING OR SHORTNESS OF BREATH., Disp: 75 mL, Rfl: 2 .  albuterol (VENTOLIN HFA) 108 (90 Base) MCG/ACT inhaler, INHALE 2 PUFFS BY MOUTH INTO THE LUNGS EVERY 4 (FOUR) HOURS AS NEEDED FOR WHEEZING OR SHORTNESS OF BREATH., Disp: 18 g, Rfl: 2 .  aspirin EC 81 MG tablet, Take 1 tablet (81 mg total) by mouth daily., Disp: 90 tablet, Rfl: 3 .  atorvastatin (LIPITOR) 40 MG tablet, Take 1 tablet (40 mg total) by mouth daily., Disp: 90 tablet, Rfl: 3 .  fluticasone (FLONASE) 50 MCG/ACT nasal spray, PLACE 2 SPRAYS INTO BOTH NOSTRILS DAILY., Disp: 16 g, Rfl: 5 .  fluticasone furoate-vilanterol (BREO ELLIPTA) 100-25 MCG/INH AEPB, Inhale 1 puff into the lungs daily., Disp: 28 each, Rfl: 3 .  loratadine (CLARITIN) 10 MG tablet, TAKE 1 TABLET (10 MG TOTAL) BY MOUTH DAILY., Disp: 30 tablet, Rfl: 11 .  meclizine (ANTIVERT) 25 MG tablet, Take 1 tablet (25 mg total) by mouth 3 (three) times daily as needed for dizziness., Disp: 20 tablet, Rfl: 0 .  Omega-3 Fatty Acids (FISH OIL) 1000 MG CAPS, Take 1 capsule (1,000 mg total) by mouth daily., Disp: 30 capsule, Rfl: 6 .  Oxymetazoline HCl (SINEX LONG-ACTING NA),  Place 2 sprays into the nose daily. Reported on 01/19/2016, Disp: , Rfl:  .  ranitidine (ZANTAC) 300 MG tablet, Take 1 tablet (300 mg total) by mouth daily after supper. (30 mins after supper), Disp: 30 tablet, Rfl: 5 .  Respiratory Therapy Supplies (FLUTTER) DEVI, USE AS DIRECTED, Disp: 1 each, Rfl: 0 .  Roflumilast (DALIRESP) 250 MCG TABS, Take 1 Package by mouth as directed., Disp: 28 tablet, Rfl: 0 .  roflumilast (DALIRESP) 500 MCG TABS tablet, Take 1 tablet (500 mcg total) by mouth daily., Disp: 30 tablet, Rfl: 11 .  SPIRIVA HANDIHALER 18 MCG inhalation capsule, PLACE 1 CAPSULE (18 MCG TOTAL) INTO INHALER AND INHALE BY MOUTH DAILY., Disp: 30 capsule, Rfl: 2  Allergies  Allergen Reactions  . Hydrocodone Nausea And Vomiting  .  Propoxyphene N-Acetaminophen Nausea And Vomiting   Review of Systems  Constitutional: Positive for fatigue.  Respiratory: Positive for apnea, shortness of breath and wheezing.   All other systems reviewed and are negative.  Objective:   Vitals:   06/21/17 1039  BP: 105/71  Pulse: (!) 113  Resp: 18   General AA&O x3. Normal mood and affect.  Vascular Dorsalis pedis and posterior tibial pulses 1/4 bilat. Brisk capillary refill to all digits. Pedal hair absent. Excessive distal cooling to the level of the digits.  Neurologic Epicritic sensation grossly intact.  Dermatologic No open lesions. Interspaces clear of maceration. R hallux nail generally well adhered but small area of likely prior lysis proximally. No areas of ingrown nail. Other nails with brown discoloration, thickening.  Orthopedic: MMT 5/5 in dorsiflexion, plantarflexion, inversion, and eversion. Normal joint ROM without pain or crepitus.   Radiographs: Taken and reviewed. No acute fractures or dislocations. No other osseous abnormalities. Assessment & Plan:  Patient was evaluated and treated and all questions answered.  Onychomycosis -Discussed removal of R hallux nail due to pain. Not presently ingrowing. No areas of infection or inflammation. Discussed that due to decreased perfusion to her foot, would be best to wait until condition warrants for removal. -Rx Fluconazole weekly for fungal nails.  Return in about 6 weeks (around 08/02/2017).

## 2017-06-22 DIAGNOSIS — J449 Chronic obstructive pulmonary disease, unspecified: Secondary | ICD-10-CM | POA: Diagnosis not present

## 2017-07-03 ENCOUNTER — Ambulatory Visit: Payer: Medicare HMO | Attending: Family Medicine | Admitting: Family Medicine

## 2017-07-03 ENCOUNTER — Encounter: Payer: Self-pay | Admitting: Family Medicine

## 2017-07-03 VITALS — BP 122/72 | HR 111 | Temp 97.5°F | Ht 64.0 in | Wt 116.0 lb

## 2017-07-03 DIAGNOSIS — E785 Hyperlipidemia, unspecified: Secondary | ICD-10-CM | POA: Diagnosis not present

## 2017-07-03 DIAGNOSIS — E041 Nontoxic single thyroid nodule: Secondary | ICD-10-CM | POA: Insufficient documentation

## 2017-07-03 DIAGNOSIS — K219 Gastro-esophageal reflux disease without esophagitis: Secondary | ICD-10-CM | POA: Diagnosis not present

## 2017-07-03 DIAGNOSIS — J449 Chronic obstructive pulmonary disease, unspecified: Secondary | ICD-10-CM | POA: Diagnosis not present

## 2017-07-03 DIAGNOSIS — J42 Unspecified chronic bronchitis: Secondary | ICD-10-CM

## 2017-07-03 DIAGNOSIS — J438 Other emphysema: Secondary | ICD-10-CM | POA: Diagnosis not present

## 2017-07-03 DIAGNOSIS — E7800 Pure hypercholesterolemia, unspecified: Secondary | ICD-10-CM | POA: Diagnosis not present

## 2017-07-03 DIAGNOSIS — Z79899 Other long term (current) drug therapy: Secondary | ICD-10-CM | POA: Diagnosis not present

## 2017-07-03 DIAGNOSIS — Z720 Tobacco use: Secondary | ICD-10-CM | POA: Diagnosis not present

## 2017-07-03 DIAGNOSIS — Z23 Encounter for immunization: Secondary | ICD-10-CM

## 2017-07-03 DIAGNOSIS — R6881 Early satiety: Secondary | ICD-10-CM | POA: Diagnosis not present

## 2017-07-03 DIAGNOSIS — Z85828 Personal history of other malignant neoplasm of skin: Secondary | ICD-10-CM | POA: Insufficient documentation

## 2017-07-03 DIAGNOSIS — Z7982 Long term (current) use of aspirin: Secondary | ICD-10-CM | POA: Insufficient documentation

## 2017-07-03 DIAGNOSIS — Z86010 Personal history of colonic polyps: Secondary | ICD-10-CM | POA: Diagnosis not present

## 2017-07-03 DIAGNOSIS — Z8719 Personal history of other diseases of the digestive system: Secondary | ICD-10-CM | POA: Insufficient documentation

## 2017-07-03 MED ORDER — SPIRIVA HANDIHALER 18 MCG IN CAPS: ORAL_CAPSULE | RESPIRATORY_TRACT | 2 refills | Status: DC

## 2017-07-03 MED FILL — SPIRIVA 18 MCG CP-HANDIHALE: 18 | 30 days supply | Qty: 30 | Fill #0

## 2017-07-03 NOTE — Progress Notes (Signed)
Subjective:  Patient ID: Margaret Henry, female    DOB: 1951-06-02  Age: 66 y.o. MRN: [id number]  CC: COPD   HPI Margaret Henry is a 66 year old woman with a history of COPD, tobacco abuse, GERD, hyperlipidemia who presents today for follow-up of early satiety. She had complained of shortness of breath with meals and having to eat small frequent portions. She denies abdominal pain, nausea, vomiting, weight loss.  Today she informs me that when she eats small portions, her symptoms improve and she is not interested in being evaluated by GI.  Three weeks ago she was seen by pulmonary and her Advair was changed to [facility]; she was also commenced on Daliresp. Dyspnea occurs on mild exertion and worsens with humidity but is improved with cold weather.  Past Medical History:  Diagnosis Date  . Allergy    hayfever  . Bronchitis   . Cancer ([place])    skin cancer on chest  . Colon polyps   . Complication of anesthesia    pt states was given too much during nasal surgery 1989; difficulty getting awake  . COPD (chronic obstructive pulmonary disease) ([place])   . Diverticulitis   . GERD (gastroesophageal reflux disease)    occasional  . Hemorrhoids   . High cholesterol   . Osteoporosis   . Pneumonia   . Shortness of breath dyspnea    with exertion   . Thyroid goiter    bx benign  . Vertigo     Past Surgical History:  Procedure Laterality Date  . ABDOMINAL HYSTERECTOMY    . BILATERAL OOPHORECTOMY  2001   for benign ovarian mass   . BIOPSY THYROID    . DENTAL SURGERY     dentures  . NASAL SEPTUM SURGERY    . PARTIAL HYSTERECTOMY  1976   for heavy menses   . RECTAL EXAM UNDER ANESTHESIA N/A 12/14/2015   Procedure: RECTAL EXAM UNDER ANESTHESIA REMOVAL OF ANAL CANAL MASS; INTERNAL HEMORRHOID LIGATION, EXTERNAL HEMORRHOID LIGATION;  Surgeon: Michael Boston, MD;  Location: [facility];  Service: General;  Laterality: N/A;  . TUBAL LIGATION    . WISDOM TOOTH EXTRACTION      Allergies  Allergen  Reactions  . Hydrocodone Nausea And Vomiting  . Propoxyphene N-Acetaminophen Nausea And Vomiting     Outpatient Medications Prior to Visit  Medication Sig Dispense Refill  . acetaminophen (TYLENOL) 500 MG tablet Take 1,500 mg by mouth every 4 (four) hours as needed. For headache    . albuterol (PROVENTIL) (2.5 MG/3ML) 0.083% nebulizer solution INHALE 3 MLS (2.5 MG TOTAL) BY NEBULIZATION EVERY 6 HOURS AS NEEDED FOR WHEEZING OR SHORTNESS OF BREATH. 75 mL 2  . albuterol (VENTOLIN HFA) 108 (90 Base) MCG/ACT inhaler INHALE 2 PUFFS BY MOUTH INTO THE LUNGS EVERY 4 (FOUR) HOURS AS NEEDED FOR WHEEZING OR SHORTNESS OF BREATH. 18 g 2  . aspirin EC 81 MG tablet Take 1 tablet (81 mg total) by mouth daily. 90 tablet 3  . atorvastatin (LIPITOR) 40 MG tablet Take 1 tablet (40 mg total) by mouth daily. 90 tablet 3  . fluconazole (DIFLUCAN) 150 MG tablet Take 1 tablet (150 mg total) by mouth once a week. 6 tablet 1  . fluticasone (FLONASE) 50 MCG/ACT nasal spray PLACE 2 SPRAYS INTO BOTH NOSTRILS DAILY. 16 g 5  . fluticasone furoate-vilanterol (BREO ELLIPTA) 100-25 MCG/INH AEPB Inhale 1 puff into the lungs daily. 28 each 3  . loratadine (CLARITIN) 10 MG tablet TAKE 1  TABLET (10 MG TOTAL) BY MOUTH DAILY. 30 tablet 11  . meclizine (ANTIVERT) 25 MG tablet Take 1 tablet (25 mg total) by mouth 3 (three) times daily as needed for dizziness. 20 tablet 0  . Omega-3 Fatty Acids (FISH OIL) 1000 MG CAPS Take 1 capsule (1,000 mg total) by mouth daily. 30 capsule 6  . ranitidine (ZANTAC) 300 MG tablet Take 1 tablet (300 mg total) by mouth daily after supper. (30 mins after supper) 30 tablet 5  . Respiratory Therapy Supplies (FLUTTER) DEVI USE AS DIRECTED 1 each 0  . Roflumilast (DALIRESP) 250 MCG TABS Take 1 Package by mouth as directed. 28 tablet 0  . SPIRIVA HANDIHALER 18 MCG inhalation capsule PLACE 1 CAPSULE (18 MCG TOTAL) INTO INHALER AND INHALE BY MOUTH DAILY. 30 capsule 2  . Oxymetazoline HCl (SINEX LONG-ACTING NA)  Place 2 sprays into the nose daily. Reported on 01/19/2016    . roflumilast (DALIRESP) 500 MCG TABS tablet Take 1 tablet (500 mcg total) by mouth daily. (Patient not taking: Reported on 07/03/2017) 30 tablet 11   No facility-administered medications prior to visit.     ROS Review of Systems  Constitutional: Negative for activity change, appetite change and fatigue.  HENT: Negative for congestion, sinus pressure and sore throat.   Eyes: Negative for visual disturbance.  Respiratory: Positive for shortness of breath. Negative for chest tightness and wheezing.   Cardiovascular: Negative for chest pain and palpitations.  Gastrointestinal: Negative for abdominal distention, abdominal pain and constipation.  Endocrine: Negative for polydipsia.  Genitourinary: Negative for dysuria and frequency.  Musculoskeletal: Negative for arthralgias and back pain.  Skin: Negative for rash.  Neurological: Negative for tremors, light-headedness and numbness.  Hematological: Does not bruise/bleed easily.  Psychiatric/Behavioral: Negative for agitation and behavioral problems.    Objective:  BP 122/72   Pulse (!) 111   Temp (!) 97.5 F (36.4 C) (Oral)   Ht 5\' 4"  (1.626 m)   Wt 116 lb (52.6 kg)   SpO2 96%   BMI 19.91 kg/m   BP/Weight 07/03/2017 6/60/6301 6/0/1093  Systolic BP 235 573 220  Diastolic BP 72 71 60  Wt. (Lbs) 116 - 115.8  BMI 19.91 - 19.88      Physical Exam  Constitutional: She is oriented to person, place, and time. She appears well-developed and well-nourished.  Neck: No thyromegaly present.  Cardiovascular: Normal heart sounds and intact distal pulses.  Tachycardia present.   No murmur heard. Pulmonary/Chest: Effort normal and breath sounds normal. She has no wheezes. She has no rales. She exhibits no tenderness.  Abdominal: Soft. Bowel sounds are normal. She exhibits no distension and no mass. There is no tenderness.  Musculoskeletal: Normal range of motion.  Neurological:  She is alert and oriented to person, place, and time.     Assessment & Plan:   1. Other emphysema ([place]) Uncontrolled Continue Spiriva,Breo, Proventil Advised that smoking cessation will retard progression of condition Keep appointment with pulmonary  2. THYROID NODULE, RIGHT Status post thyroidectomy - US Soft Tissue Head/Neck; Future - TSH  3. Early satiety Improved by eating small frequent portions I have suggested GI referral for upper endoscopy which she declines   No orders of the defined types were placed in this encounter.   Follow-up: Return in about 3 months (around 10/02/2017) for Follow-up on chronic medical conditions.   Arnoldo Morale MD

## 2017-07-04 LAB — TSH: TSH: 0.663 u[IU]/mL (ref 0.450–4.500)

## 2017-07-12 ENCOUNTER — Ambulatory Visit (HOSPITAL_COMMUNITY)
Admission: RE | Admit: 2017-07-12 | Discharge: 2017-07-12 | Disposition: A | Discharge status: Home / Self Care | Payer: Medicare HMO | Source: Ambulatory Visit | Attending: Family Medicine | Admitting: Family Medicine

## 2017-07-12 DIAGNOSIS — E042 Nontoxic multinodular goiter: Secondary | ICD-10-CM | POA: Insufficient documentation

## 2017-07-12 DIAGNOSIS — E041 Nontoxic single thyroid nodule: Secondary | ICD-10-CM | POA: Diagnosis present

## 2017-07-15 MED FILL — ALBUTEROL 0.083% INHAL SOLN: (2.5 MG/3ML | 6 days supply | Qty: 75 | Fill #1

## 2017-07-15 MED FILL — VENTOLIN HFA 90 MCG INHALER: 108 (90 BAS | 17 days supply | Qty: 18 | Fill #0

## 2017-07-15 MED FILL — FLUTICASONE PROP 50 MCG SPR: 50 | 30 days supply | Qty: 16 | Fill #3

## 2017-07-29 ENCOUNTER — Other Ambulatory Visit: Payer: Self-pay | Admitting: Pulmonary Disease

## 2017-07-29 MED FILL — LORATADINE 10 MG TABLET: 10 | 100 days supply | Qty: 100 | Fill #1

## 2017-07-29 MED FILL — SPIRIVA 18 MCG CP-HANDIHALE: 18 | 30 days supply | Qty: 30 | Fill #1

## 2017-07-30 MED FILL — raNITIdine HCL 300 MG TABS: 300 | 30 days supply | Qty: 30 | Fill #3

## 2017-07-30 MED FILL — BREO ELLIPTA 100-25 MCG INH: 100-25 | 30 days supply | Qty: 60 | Fill #0

## 2017-08-02 ENCOUNTER — Ambulatory Visit (INDEPENDENT_AMBULATORY_CARE_PROVIDER_SITE_OTHER): Payer: Medicare HMO | Admitting: Podiatry

## 2017-08-02 DIAGNOSIS — L600 Ingrowing nail: Secondary | ICD-10-CM

## 2017-08-02 DIAGNOSIS — L603 Nail dystrophy: Secondary | ICD-10-CM

## 2017-08-02 NOTE — Patient Instructions (Signed)

## 2017-08-07 NOTE — Progress Notes (Signed)
  Subjective:  Patient ID: Margaret Henry, female    DOB: [birth year],  MRN: [id number]  Chief Complaint  Patient presents with  . Ingrown Toenail    right great toe - no change   66 y.o. female returns for the above complaint.  Reports no change from the nail. Has been taking fluconazole as directed.  Objective:  There were no vitals filed for this visit. General AA&O x3. Normal mood and affect.  Vascular Pedal pulses palpable.  Neurologic Epicritic sensation grossly intact.  Dermatologic No open lesions. Skin normal texture and turgor. Right hallux nail with discoloration, pain to palpation, nail dystrophy  Orthopedic: No pain to palpation either foot.   Assessment & Plan:  Patient was evaluated and treated and all questions answered.  Dystrophic Nail, right -Patient elects to proceed with nail removal today. Discussed that the nail may or may not grow back without fungus. -Nail excised. See procedure note. -Educated on post-procedure care including soaking. Written instructions provided. -Patient to follow up in 2 weeks for nail check.  Procedure: Avulsion of Ingrown Toenail Location: Right 1st toe  Anesthesia: Lidocaine 1% plain; 1.21mL and Marcaine 0.5% plain; 1.31mL, digital block. Skin Prep: Alcohol. Dressing: Silvadene; telfa; dry, sterile, compression dressing. Technique: Following skin prep, the toe was exsanguinated and a tourniquet was secured at the base of the toe. The nail was freed and avulsed. The tourniquet was then removed and sterile dressing applied. Disposition: Patient tolerated procedure well. Patient to return in 2 weeks for follow-up.   Return in about 2 weeks (around 08/16/2017) for nail check.

## 2017-08-08 MED FILL — ALBUTEROL 0.083% INHAL SOLN: (2.5 MG/3ML | 6 days supply | Qty: 75 | Fill #2

## 2017-08-08 MED FILL — VENTOLIN HFA 90 MCG INHALER: 108 (90 BAS | 17 days supply | Qty: 18 | Fill #1

## 2017-08-14 ENCOUNTER — Ambulatory Visit (INDEPENDENT_AMBULATORY_CARE_PROVIDER_SITE_OTHER): Payer: Medicare HMO | Admitting: Podiatry

## 2017-08-14 ENCOUNTER — Encounter: Payer: Self-pay | Admitting: Podiatry

## 2017-08-14 DIAGNOSIS — L603 Nail dystrophy: Secondary | ICD-10-CM

## 2017-08-23 ENCOUNTER — Ambulatory Visit (INDEPENDENT_AMBULATORY_CARE_PROVIDER_SITE_OTHER): Payer: Medicare HMO | Admitting: Cardiology

## 2017-08-23 ENCOUNTER — Encounter: Payer: Self-pay | Admitting: Cardiology

## 2017-08-23 VITALS — BP 132/64 | HR 99 | Ht 64.0 in | Wt 113.0 lb

## 2017-08-23 DIAGNOSIS — I2510 Atherosclerotic heart disease of native coronary artery without angina pectoris: Secondary | ICD-10-CM

## 2017-08-23 DIAGNOSIS — R0602 Shortness of breath: Secondary | ICD-10-CM

## 2017-08-23 DIAGNOSIS — R0600 Dyspnea, unspecified: Secondary | ICD-10-CM

## 2017-08-23 DIAGNOSIS — J438 Other emphysema: Secondary | ICD-10-CM

## 2017-08-23 DIAGNOSIS — J209 Acute bronchitis, unspecified: Secondary | ICD-10-CM | POA: Diagnosis not present

## 2017-08-23 DIAGNOSIS — F17200 Nicotine dependence, unspecified, uncomplicated: Secondary | ICD-10-CM

## 2017-08-23 DIAGNOSIS — Z8249 Family history of ischemic heart disease and other diseases of the circulatory system: Secondary | ICD-10-CM

## 2017-08-23 MED ORDER — PREDNISONE 10 MG PO TABS: ORAL_TABLET | ORAL | 0 refills | Status: DC

## 2017-08-23 MED ORDER — AZITHROMYCIN 250 MG PO TABS: ORAL_TABLET | ORAL | 0 refills | Status: DC

## 2017-08-23 MED FILL — AZITHROMYCIN 250 MG TABLET: 250 | 5 days supply | Qty: 6 | Fill #0

## 2017-08-23 MED FILL — predniSONE 10 MG TABS: 10 | 5 days supply | Qty: 10 | Fill #0

## 2017-08-23 NOTE — Progress Notes (Signed)
Cardiology Office Note    Date:  08/23/2017   ID:  AZRIEL DANCY, DOB 1951/07/25, MRN [id number]  PCP:  Arnoldo Morale, MD  Cardiologist:   Reason for visit: 6 months follow-up  History of Present Illness:  Margaret Henry is a 66 y.o. female who is being seen today for the evaluation of chest pain at the request of Funches, Adriana Mccallum, MD.  She has a greater than 50-pack-year history of smoking, hyperlipidemia and COPD. Chest CT for lung cancer showed calcification in all 3 coronary arteries as well as upper borderline size of ascending aorta and severe diffuse calcifications of the aortic arch and descending thoracic aorta. CT was reviewed by Dr. Meda Coffee felt her ascending aorta was within normal limits. She underwent nuclear stress testing 06/2016 that was negative for ischemia or prior infarction. Because of evidence of coronary calcification elevated lipids the patient was started on atorvastatin 40 mg daily.  Mother died at 54 MI, father MI 40's,CABG in 36's. She continues to smoke 3-4 cigarettes a day. She said she can't make her bed without getting short of breath. She is complaining of a lot of indigestion recently described as chest tightness but eases with Tums. She does not want to pursue any further stress testing. There was also concern for left axis deviation on EKG but reviewing prior EKGs this has been present.  08/23/2017, the patient is coming for follow-up, she states that she continues to feel more short of breath with desaturation down to upper 80s with minimal walking, she had to stop couple times on the way to our office from the parking lot today. She states that she has been coughing having productive sputum and felt subjective fever earlier this week. She denies any lower extremity edema orthopnea or proximal nocturnal dyspnea. She continues to smoke a few cigarettes a day, she states that she tried everything including Wellbutrin and nicotine patches and Chantix. She denies  any chest pain.  Past Medical History:  Diagnosis Date  . Allergy    hayfever  . Bronchitis   . Cancer ([place])    skin cancer on chest  . Colon polyps   . Complication of anesthesia    pt states was given too much during nasal surgery 1989; difficulty getting awake  . COPD (chronic obstructive pulmonary disease) ([place])   . Diverticulitis   . GERD (gastroesophageal reflux disease)    occasional  . Hemorrhoids   . High cholesterol   . Osteoporosis   . Pneumonia   . Shortness of breath dyspnea    with exertion   . Thyroid goiter    bx benign  . Vertigo     Past Surgical History:  Procedure Laterality Date  . ABDOMINAL HYSTERECTOMY    . BILATERAL OOPHORECTOMY  2001   for benign ovarian mass   . BIOPSY THYROID    . DENTAL SURGERY     dentures  . NASAL SEPTUM SURGERY    . PARTIAL HYSTERECTOMY  1976   for heavy menses   . RECTAL EXAM UNDER ANESTHESIA REMOVAL OF ANAL CANAL MASS; INTERNAL HEMORRHOID LIGATION, EXTERNAL HEMORRHOID LIGATION N/A 12/14/2015   Performed by Michael Boston, MD at [facility] ORS  . TUBAL LIGATION    . WISDOM TOOTH EXTRACTION      Current Medications: Current Meds  Medication Sig  . acetaminophen (TYLENOL) 500 MG tablet Take 1,500 mg by mouth every 4 (four) hours as needed. For headache  . albuterol (PROVENTIL) (2.5 MG/3ML) 0.083% nebulizer  solution INHALE 3 MLS (2.5 MG TOTAL) BY NEBULIZATION EVERY 6 HOURS AS NEEDED FOR WHEEZING OR SHORTNESS OF BREATH.  Marland Kitchen albuterol (VENTOLIN HFA) 108 (90 Base) MCG/ACT inhaler INHALE 2 PUFFS BY MOUTH INTO THE LUNGS EVERY 4 (FOUR) HOURS AS NEEDED FOR WHEEZING OR SHORTNESS OF BREATH.  Marland Kitchen aspirin EC 81 MG tablet Take 1 tablet (81 mg total) by mouth daily.  Marland Kitchen atorvastatin (LIPITOR) 40 MG tablet Take 1 tablet (40 mg total) by mouth daily.  Marland Kitchen BREO ELLIPTA 100-25 MCG/INH AEPB INHALE 1 PUFF BY MOUTH INTO THE LUNGS DAILY.  . fluconazole (DIFLUCAN) 150 MG tablet Take 1 tablet (150 mg total) by mouth once a week.  . fluticasone (FLONASE) 50  MCG/ACT nasal spray PLACE 2 SPRAYS INTO BOTH NOSTRILS DAILY.  Marland Kitchen loratadine (CLARITIN) 10 MG tablet TAKE 1 TABLET (10 MG TOTAL) BY MOUTH DAILY.  . meclizine (ANTIVERT) 25 MG tablet Take 1 tablet (25 mg total) by mouth 3 (three) times daily as needed for dizziness.  . Omega-3 Fatty Acids (FISH OIL) 1000 MG CAPS Take 1 capsule (1,000 mg total) by mouth daily.  . Oxymetazoline HCl (SINEX LONG-ACTING NA) Place 2 sprays into the nose daily. Reported on 01/19/2016  . ranitidine (ZANTAC) 300 MG tablet Take 1 tablet (300 mg total) by mouth daily after supper. (30 mins after supper)  . Respiratory Therapy Supplies (FLUTTER) DEVI USE AS DIRECTED  . Roflumilast (DALIRESP) 250 MCG TABS Take 1 Package by mouth as directed.  . roflumilast (DALIRESP) 500 MCG TABS tablet Take 1 tablet (500 mcg total) by mouth daily.  Marland Kitchen SPIRIVA HANDIHALER 18 MCG inhalation capsule PLACE 1 CAPSULE (18 MCG TOTAL) INTO INHALER AND INHALE BY MOUTH DAILY.     Allergies:   Hydrocodone and Propoxyphene n-acetaminophen   Social History   Socioeconomic History  . Marital status: Widowed    Spouse name: None  . Number of children: None  . Years of education: None  . Highest education level: None  Social Needs  . Financial resource strain: None  . Food insecurity - worry: None  . Food insecurity - inability: None  . Transportation needs - medical: None  . Transportation needs - non-medical: None  Occupational History  . None  Tobacco Use  . Smoking status: Light Tobacco Smoker    Packs/day: 0.00    Years: 49.00    Pack years: 0.00    Types: Cigarettes  . Smokeless tobacco: Never Used  . Tobacco comment: 2 cigarette daily-06/11/17  Substance and Sexual Activity  . Alcohol use: No    Alcohol/week: 0.0 oz  . Drug use: No  . Sexual activity: None  Other Topics Concern  . None  Social History Narrative  . None     Family History:  The patient' family history includes COPD in her father; Heart disease in her mother; Liver  cancer in her paternal grandmother; Lung cancer in her maternal grandfather.   ROS:   Please see the history of present illness.    Review of Systems  Constitution: Negative.  HENT: Negative.   Eyes: Negative.   Cardiovascular: Positive for dyspnea on exertion.  Respiratory: Positive for shortness of breath and wheezing.   Hematologic/Lymphatic: Negative.   Musculoskeletal: Negative.  Negative for joint pain.  Gastrointestinal: Positive for heartburn.  Genitourinary: Negative.   Neurological: Negative.    All other systems reviewed and are negative.   PHYSICAL EXAM:   VS:  BP 132/64   Pulse 99   Ht 5\' 4"  (1.626  m)   Wt 113 lb (51.3 kg)   SpO2 93%   BMI 19.40 kg/m   Physical Exam  GEN: Thin, looks older than stated age, in no acute distress  Neck: no JVD, carotid bruits, or masses Cardiac:RRR; no murmurs, rubs, or gallops  Respiratory:  clear to auscultation bilaterally, normal work of breathing GI: soft, nontender, nondistended, + BS Ext: without cyanosis, clubbing, or edema, Good distal pulses bilaterally Neuro:  Alert and Oriented x 3 Psych: euthymic mood, full affect  Wt Readings from Last 3 Encounters:  08/23/17 113 lb (51.3 kg)  07/03/17 116 lb (52.6 kg)  06/11/17 115 lb 12.8 oz (52.5 kg)      Studies/Labs Reviewed:   EKG:  EKG is ordered today.  The ekg ordered today demonstrates Normal sinus rhythm with left axis deviation, poor R-wave progression anteriorly, no acute change when reviewing prior tracings  Recent Labs: 04/18/2017: ALT 10; BUN 13; Creatinine, Ser 0.59; Hemoglobin 14.6; Platelets 308; Potassium 4.7; Sodium 145 07/03/2017: TSH 0.663   Lipid Panel    Component Value Date/Time   CHOL 177 09/05/2016 1218   TRIG 194 (H) 09/05/2016 1218   HDL 57 09/05/2016 1218   CHOLHDL 3.1 09/05/2016 1218   VLDL 39 (H) 09/05/2016 1218   LDLCALC 81 09/05/2016 1218   LDLDIRECT 166 (H) 09/26/2015 1001    Additional studies/ records that were reviewed today  include:    Lexi scan Myoview 06/2016. The left ventricular ejection fraction is hyperdynamic (>65%).  Nuclear stress EF: 74%.  There was no ST segment deviation noted during stress.  The study is normal.  This is a low risk study.   Normal resting and stress perfusion. No ischemia or infarction EF 74%     ASSESSMENT:    1. Acute bronchitis, unspecified organism   2. Other emphysema ([place])   3. SOB (shortness of breath)   4. TOBACCO ABUSE   5. Family history of early CAD   57. Dyspnea, unspecified type   7. Coronary arteriosclerosis      PLAN:  In order of problems listed above:  1. The patient is complaining of worsening dyspnea on exertion now with minimal exertion, however she had normal stress test a year ago, and normal LVEF and only grade 1 diastolic dysfunction on echocardiogram the last months. This is most probably secondary to underlying COPD that is now complicated by acute bronchitis, I will prescribe a Z-Pak and taper dose of prednisone with 40 mg the first day following by 2 days of 20 mg daily followed by 10 mg daily for 2 days. We will follow the patient in one month and if she continues having significant dyspnea we will consider to do coronary CTA. She underwent chest CT for screening of lung cancer a year ago week where she had significant three-vessel coronary calcifications. She is already on atorvastatin and aspirin for prevention. She states that she has a big as difficulties with quitting smoking she has tried pretty much everything now down to 3 cigarettes a day.   Medication Adjustments/Labs and Tests Ordered: Current medicines are reviewed at length with the patient today.  Concerns regarding medicines are outlined above.  Medication changes, Labs and Tests ordered today are listed in the Patient Instructions below. Patient Instructions  Medication Instructions:   TAKE PREDNISONE 40 MG BY MOUTH FOR 1 DAY, THEN TAKE 20 MG BY MOUTH FOR 2 DAYS, THEN TAKE  10 MG BY MOUTH FOR 2 DAYS, THEN STOP.   START TAKING  Z-PAK ---YOU WILL TAKE 2 TABS ON THE FIRST DAY, THEN 1 TAB BY MOUTH DAILY THEREAFTER, UNTIL PACK IS COMPLETE.      Follow-Up:  3-4 WEEKS WITH AN EXTENDER IN OUR OFFICE        If you need a refill on your cardiac medications before your next appointment, please call your pharmacy.      Signed, Ena Dawley, MD  08/23/2017 4:39 PM    [address], [place], [place]  [address] Phone: [phone]; Fax: [phone]

## 2017-08-23 NOTE — Patient Instructions (Signed)
Medication Instructions:   TAKE PREDNISONE 40 MG BY MOUTH FOR 1 DAY, THEN TAKE 20 MG BY MOUTH FOR 2 DAYS, THEN TAKE 10 MG BY MOUTH FOR 2 DAYS, THEN STOP.   START TAKING Z-PAK ---YOU WILL TAKE 2 TABS ON THE FIRST DAY, THEN 1 TAB BY MOUTH DAILY THEREAFTER, UNTIL PACK IS COMPLETE.      Follow-Up:  3-4 WEEKS WITH AN EXTENDER IN OUR OFFICE        If you need a refill on your cardiac medications before your next appointment, please call your pharmacy.

## 2017-08-26 MED FILL — SPIRIVA 18 MCG CP-HANDIHALE: 18 | 30 days supply | Qty: 30 | Fill #2

## 2017-08-26 MED FILL — BREO ELLIPTA 100-25 MCG INH: 100-25 | 30 days supply | Qty: 60 | Fill #1

## 2017-08-26 MED FILL — FLUTICASONE PROP 50 MCG SPR: 50 | 30 days supply | Qty: 16 | Fill #4

## 2017-08-26 MED FILL — VENTOLIN HFA 90 MCG INHALER: 108 (90 BAS | 17 days supply | Qty: 18 | Fill #2

## 2017-09-01 NOTE — Progress Notes (Signed)
  Subjective:  Patient ID: Margaret Henry, female    DOB: 1951-03-30,  MRN: [id number]  Chief Complaint  Patient presents with  . Nail Problem    i am doing good on my right big toenail   66 y.o. female returns for the above complaint.  Reports there is doing well.  Denies issues.  Objective:   General AA&O x3. Normal mood and affect.  Vascular Foot warm and well perfused with good capillary refill.  Neurologic Sensation grossly intact.  Dermatologic Nail avulsion site healing well without drainage or erythema. Nail bed with overlying soft crust. Left intact. No signs of local infection.  Orthopedic: No tenderness to palpation of the toe.   Assessment & Plan:  Patient was evaluated and treated and all questions answered.  S/p Toenail avulsion, R -Healing well without issue. -Discussed return precautions. -F/u 2 months for check on nail growth  Return in about 2 months (around 10/14/2017) for nail f/u.

## 2017-09-06 ENCOUNTER — Ambulatory Visit (HOSPITAL_BASED_OUTPATIENT_CLINIC_OR_DEPARTMENT_OTHER)
Admission: RE | Admit: 2017-09-06 | Discharge: 2017-09-06 | Disposition: A | Discharge status: Home / Self Care | Payer: Medicare HMO | Source: Ambulatory Visit | Attending: Acute Care | Admitting: Acute Care

## 2017-09-06 DIAGNOSIS — F17210 Nicotine dependence, cigarettes, uncomplicated: Secondary | ICD-10-CM | POA: Diagnosis not present

## 2017-09-06 DIAGNOSIS — I700 Atherosclerosis of aorta: Secondary | ICD-10-CM | POA: Insufficient documentation

## 2017-09-06 DIAGNOSIS — Z122 Encounter for screening for malignant neoplasm of respiratory organs: Secondary | ICD-10-CM | POA: Diagnosis not present

## 2017-09-06 DIAGNOSIS — J439 Emphysema, unspecified: Secondary | ICD-10-CM | POA: Diagnosis not present

## 2017-09-10 ENCOUNTER — Ambulatory Visit (INDEPENDENT_AMBULATORY_CARE_PROVIDER_SITE_OTHER): Payer: Medicare HMO | Admitting: Physician Assistant

## 2017-09-10 ENCOUNTER — Encounter: Payer: Self-pay | Admitting: Physician Assistant

## 2017-09-10 VITALS — BP 108/60 | HR 100 | Ht 64.0 in | Wt 111.0 lb

## 2017-09-10 DIAGNOSIS — D013 Carcinoma in situ of anus and anal canal: Secondary | ICD-10-CM | POA: Diagnosis not present

## 2017-09-10 DIAGNOSIS — K5909 Other constipation: Secondary | ICD-10-CM | POA: Diagnosis not present

## 2017-09-10 DIAGNOSIS — E785 Hyperlipidemia, unspecified: Secondary | ICD-10-CM

## 2017-09-10 DIAGNOSIS — J438 Other emphysema: Secondary | ICD-10-CM

## 2017-09-10 DIAGNOSIS — I2510 Atherosclerotic heart disease of native coronary artery without angina pectoris: Secondary | ICD-10-CM

## 2017-09-10 DIAGNOSIS — F17200 Nicotine dependence, unspecified, uncomplicated: Secondary | ICD-10-CM | POA: Diagnosis not present

## 2017-09-10 DIAGNOSIS — K644 Residual hemorrhoidal skin tags: Secondary | ICD-10-CM | POA: Diagnosis not present

## 2017-09-10 NOTE — Progress Notes (Signed)
Cardiology Office Note    Date:  09/10/2017   ID:  Margaret Henry, DOB 1950/12/12, MRN [id number]  PCP:  Arnoldo Morale, MD  Cardiologist: Dr. Meda Coffee  No chief complaint on file.   History of Present Illness:  Margaret Henry is a 66 y.o. female with history of over 50-pack-year of smoking, hyperlipidemia and COPD.  Chest CT for lung cancer showed calcification of all 3 coronary arteries as well as borderline size of ascending aorta and severe diffuse calcifications of the aortic root and descending thoracic aorta.  CT was reviewed by Dr. Meda Coffee who felt her a sending aorta was within normal limits.  Nuclear stress test 06/2016 was negative for ischemia or prior infarction.  Because of coronary calcification and elevated lipids the patient was started on atorvastatin 40 mill daily.  She also has a strong family history of CAD with her mother dying at 81 of an MI father an MI in his 71s and CABG in his 33s.  2D echo last year showed normal LVEF with grade 1 DD.  I saw her in 04/2017 complaining of dyspnea which I felt was most likely secondary to COPD.  2D echo showed normal LVEF 65-70% with grade 1 DD.  Patient saw Dr. Meda Coffee 08/23/17 complaining of worsening dyspnea on exertion and continues to smoke.  She felt she had underlying COPD complicated by acute bronchitis and prescribed Z-Pak and prednisone taper.  If she continued to have significant dyspnea she recommend doing a coronary CTA.   Today for follow-up.  Her breathing is much better.  She had 2 good days last week where she did not have to use her inhalers.  She had an episode of her blood pressure dropping to 80/50 while watching TV and felt a little queasy but then it came up to her normal of 90-100/60.  She does not think she was dehydrated.  Continues to smoke 3-4 cigarettes daily.   Past Medical History:  Diagnosis Date  . Allergy    hayfever  . Bronchitis   . Cancer ([place])    skin cancer on chest  . Colon polyps   .  Complication of anesthesia    pt states was given too much during nasal surgery 1989; difficulty getting awake  . COPD (chronic obstructive pulmonary disease) ([place])   . Diverticulitis   . GERD (gastroesophageal reflux disease)    occasional  . Hemorrhoids   . High cholesterol   . Osteoporosis   . Pneumonia   . Shortness of breath dyspnea    with exertion   . Thyroid goiter    bx benign  . Vertigo     Past Surgical History:  Procedure Laterality Date  . ABDOMINAL HYSTERECTOMY    . BILATERAL OOPHORECTOMY  2001   for benign ovarian mass   . BIOPSY THYROID    . DENTAL SURGERY     dentures  . NASAL SEPTUM SURGERY    . PARTIAL HYSTERECTOMY  1976   for heavy menses   . RECTAL EXAM UNDER ANESTHESIA N/A 12/14/2015   Procedure: RECTAL EXAM UNDER ANESTHESIA REMOVAL OF ANAL CANAL MASS; INTERNAL HEMORRHOID LIGATION, EXTERNAL HEMORRHOID LIGATION;  Surgeon: Michael Boston, MD;  Location: [facility];  Service: General;  Laterality: N/A;  . TUBAL LIGATION    . WISDOM TOOTH EXTRACTION      Current Medications: Current Meds  Medication Sig  . acetaminophen (TYLENOL) 500 MG tablet Take 1,500 mg by mouth every 4 (four) hours as needed. For  headache  . albuterol (PROVENTIL) (2.5 MG/3ML) 0.083% nebulizer solution INHALE 3 MLS (2.5 MG TOTAL) BY NEBULIZATION EVERY 6 HOURS AS NEEDED FOR WHEEZING OR SHORTNESS OF BREATH.  Marland Kitchen albuterol (VENTOLIN HFA) 108 (90 Base) MCG/ACT inhaler INHALE 2 PUFFS BY MOUTH INTO THE LUNGS EVERY 4 (FOUR) HOURS AS NEEDED FOR WHEEZING OR SHORTNESS OF BREATH.  Marland Kitchen aspirin EC 81 MG tablet Take 1 tablet (81 mg total) by mouth daily.  Marland Kitchen atorvastatin (LIPITOR) 40 MG tablet Take 1 tablet (40 mg total) by mouth daily.  Marland Kitchen BREO ELLIPTA 100-25 MCG/INH AEPB INHALE 1 PUFF BY MOUTH INTO THE LUNGS DAILY.  . fluconazole (DIFLUCAN) 150 MG tablet Take 1 tablet (150 mg total) by mouth once a week.  . fluticasone (FLONASE) 50 MCG/ACT nasal spray PLACE 2 SPRAYS INTO BOTH NOSTRILS DAILY.  Marland Kitchen loratadine  (CLARITIN) 10 MG tablet TAKE 1 TABLET (10 MG TOTAL) BY MOUTH DAILY.  . meclizine (ANTIVERT) 25 MG tablet Take 1 tablet (25 mg total) by mouth 3 (three) times daily as needed for dizziness.  . Omega-3 Fatty Acids (FISH OIL) 1000 MG CAPS Take 1 capsule (1,000 mg total) by mouth daily.  . Oxymetazoline HCl (SINEX LONG-ACTING NA) Place 2 sprays into the nose daily. Reported on 01/19/2016  . ranitidine (ZANTAC) 300 MG tablet Take 1 tablet (300 mg total) by mouth daily after supper. (30 mins after supper)  . Respiratory Therapy Supplies (FLUTTER) DEVI USE AS DIRECTED  . Roflumilast (DALIRESP) 250 MCG TABS Take 1 Package by mouth as directed.  . roflumilast (DALIRESP) 500 MCG TABS tablet Take 1 tablet (500 mcg total) by mouth daily.  Marland Kitchen SPIRIVA HANDIHALER 18 MCG inhalation capsule PLACE 1 CAPSULE (18 MCG TOTAL) INTO INHALER AND INHALE BY MOUTH DAILY.     Allergies:   Hydrocodone and Propoxyphene n-acetaminophen   Social History   Socioeconomic History  . Marital status: Widowed    Spouse name: None  . Number of children: None  . Years of education: None  . Highest education level: None  Social Needs  . Financial resource strain: None  . Food insecurity - worry: None  . Food insecurity - inability: None  . Transportation needs - medical: None  . Transportation needs - non-medical: None  Occupational History  . None  Tobacco Use  . Smoking status: Light Tobacco Smoker    Packs/day: 0.00    Years: 49.00    Pack years: 0.00    Types: Cigarettes  . Smokeless tobacco: Never Used  . Tobacco comment: 2 cigarette daily-06/11/17  Substance and Sexual Activity  . Alcohol use: No    Alcohol/week: 0.0 oz  . Drug use: No  . Sexual activity: None  Other Topics Concern  . None  Social History Narrative  . None     Family History:  The patient's family history includes COPD in her father; Heart disease in her mother; Liver cancer in her paternal grandmother; Lung cancer in her maternal  grandfather.   ROS:   Please see the history of present illness.    Review of Systems  Constitution: Negative.  HENT: Negative.   Eyes: Negative.   Cardiovascular: Positive for dyspnea on exertion.  Respiratory: Positive for cough and wheezing.   Hematologic/Lymphatic: Bruises/bleeds easily.  Musculoskeletal: Negative.  Negative for joint pain.  Gastrointestinal: Negative.   Genitourinary: Negative.   Neurological: Negative.    All other systems reviewed and are negative.   PHYSICAL EXAM:   VS:  BP 108/60   Pulse 100  Ht 5\' 4"  (1.626 m)   Wt 111 lb (50.3 kg)   SpO2 94%   BMI 19.05 kg/m   Physical Exam  GEN: Well nourished, well developed, in no acute distress  Neck: no JVD, carotid bruits, or masses Cardiac:RRR; no murmurs, rubs, or gallops  Respiratory: Decreased breath sounds without wheezing GI: soft, nontender, nondistended, + BS Ext: without cyanosis, clubbing, or edema, Good distal pulses bilaterally Neuro:  Alert and Oriented x 3 Psych: euthymic mood, full affect  Wt Readings from Last 3 Encounters:  09/10/17 111 lb (50.3 kg)  08/23/17 113 lb (51.3 kg)  07/03/17 116 lb (52.6 kg)      Studies/Labs Reviewed:   EKG:  EKG is not ordered today.    Recent Labs: 04/18/2017: ALT 10; BUN 13; Creatinine, Ser 0.59; Hemoglobin 14.6; Platelets 308; Potassium 4.7; Sodium 145 07/03/2017: TSH 0.663   Lipid Panel    Component Value Date/Time   CHOL 177 09/05/2016 1218   TRIG 194 (H) 09/05/2016 1218   HDL 57 09/05/2016 1218   CHOLHDL 3.1 09/05/2016 1218   VLDL 39 (H) 09/05/2016 1218   LDLCALC 81 09/05/2016 1218   LDLDIRECT 166 (H) 09/26/2015 1001    Additional studies/ records that were reviewed today include:  2D echo 05/07/17 Study Conclusions   - Left ventricle: The cavity size was normal. Wall thickness was   normal. Systolic function was vigorous. The estimated ejection   fraction was in the range of 65% to 70%. Wall motion was normal;   there were no  regional wall motion abnormalities. Doppler   parameters are consistent with abnormal left ventricular   relaxation (grade 1 diastolic dysfunction). - Aortic valve: There was mild regurgitation.   Impressions:   - Vigorous LV systolic function; mild diastolic dysfunction; aortic   valve not well visualized but appears to be mildly calcified;   mildly elevated velocity of 2.2 m/s likely related to vigorous LV   function; mild AI.    The left ventricular ejection fraction is hyperdynamic (>65%).  Nuclear stress EF: 74%.  There was no ST segment deviation noted during stress.  The study is normal.  This is a low risk study.   Normal resting and stress perfusion. No ischemia or infarction EF 74%  CT chest without contrast 08/2015 IMPRESSION: 1. Lung-RADS Category 2, benign appearance or behavior. Continue annual screening with low-dose chest CT without contrast in 12 months. Centrilobular emphysema with at least 1 pulmonary nodule. 2.  Atherosclerosis, including within the coronary arteries. 3. Indeterminate partially calcified right-sided thyroid nodule. Consider ultrasound    ASSESSMENT:    1. Coronary arteriosclerosis   2. TOBACCO ABUSE   3. Hyperlipidemia, unspecified hyperlipidemia type   4. Other emphysema ([place])      PLAN:  In order of problems listed above:  Coronary arthrosclerosis on CT in 2016 Lexiscan Myoview normal 06/2016.  Continue Lipitor and aspirin.  Follow-up with Dr. Meda Coffee in 6 months.  Tobacco abuse smoking cessation essential and discussed again with patient  Hyperlipidemia continue Lipitor  COPD/emphysema with exacerbation improved with Z-Pak and steroid taper.    Medication Adjustments/Labs and Tests Ordered: Current medicines are reviewed at length with the patient today.  Concerns regarding medicines are outlined above.  Medication changes, Labs and Tests ordered today are listed in the Patient Instructions below. Patient Instructions    Medication Instructions:  Your physician recommends that you continue on your current medications as directed. Please refer to the Current Medication list given to you  today.  Labwork: None ordered   Testing/Procedures: None ordered   Follow-Up: Your physician wants you to follow-up in: 6 months with Dr. Meda Coffee. You will receive a reminder letter in the mail two months in advance. If you don't receive a letter, please call our office to schedule the follow-up appointment.  Any Other Special Instructions Will Be Listed Below (If Applicable).   Smoking Tobacco Information Smoking tobacco will very likely harm your health. Tobacco contains a poisonous (toxic), colorless chemical called nicotine. Nicotine affects the brain and makes tobacco addictive. This change in your brain can make it hard to stop smoking. Tobacco also has other toxic chemicals that can hurt your body and raise your risk of many cancers. How can smoking tobacco affect me? Smoking tobacco can increase your chances of having serious health conditions, such as:  Cancer. Smoking is most commonly associated with lung cancer, but can lead to cancer in other parts of the body.  Chronic obstructive pulmonary disease (COPD). This is a long-term lung condition that makes it hard to breathe. It also gets worse over time.  High blood pressure (hypertension), heart disease, stroke, or heart attack.  Lung infections, such as pneumonia.  Cataracts. This is when the lenses in the eyes become clouded.  Digestive problems. This may include peptic ulcers, heartburn, and gastroesophageal reflux disease (GERD).  Oral health problems, such as gum disease and tooth loss.  Loss of taste and smell.  Smoking can affect your appearance by causing:  Wrinkles.  Yellow or stained teeth, fingers, and fingernails.  Smoking tobacco can also affect your social life.  Many workplaces, [employer], hotels, and public places are  tobacco-free. This means that you may experience challenges in finding places to smoke when away from home.  The cost of a smoking habit can be expensive. Expenses for someone who smokes come in two ways: ? You spend money on a regular basis to buy tobacco. ? Your health care costs in the long-term are higher if you smoke.  Tobacco smoke can also affect the health of those around you. Children of smokers have greater chances of: ? Sudden infant death syndrome (SIDS). ? Ear infections. ? Lung infections.  What lifestyle changes can be made?  Do not start smoking. Quit if you already do.  To quit smoking: ? Make a plan to quit smoking and commit yourself to it. Look for programs to help you and ask your health care provider for recommendations and ideas. ? Talk with your health care provider about using nicotine replacement medicines to help you quit. Medicine replacement medicines include gum, lozenges, patches, sprays, or pills. ? Do not replace cigarette smoking with electronic cigarettes, which are commonly called e-cigarettes. The safety of e-cigarettes is not known, and some may contain harmful chemicals. ? Avoid places, people, or situations that tempt you to smoke. ? If you try to quit but return to smoking, don't give up hope. It is very common for people to try a number of times before they fully succeed. When you feel ready again, give it another try.  Quitting smoking might affect the way you eat as well as your weight. Be prepared to monitor your eating habits. Get support in planning and following a healthy diet.  Ask your health care provider about having regular tests (screenings) to check for cancer. This may include blood tests, imaging tests, and other tests.  Exercise regularly. Consider taking walks, joining a gym, or doing yoga or exercise classes.  Develop  skills to manage your stress. These skills include meditation. What are the benefits of quitting smoking? By  quitting smoking, you may:  Lower your risk of getting cancer and other diseases caused by smoking.  Live longer.  Breathe better.  Lower your blood pressure and heart rate.  Stop your addiction to tobacco.  Stop creating secondhand smoke that hurts other people.  Improve your sense of taste and smell.  Look better over time, due to having fewer wrinkles and less staining.  What can happen if changes are not made? If you do not stop smoking, you may:  Get cancer and other diseases.  Develop COPD or other long-term (chronic) lung conditions.  Develop serious problems with your heart and blood vessels (cardiovascular system).  Need more tests to screen for problems caused by smoking.  Have higher, long-term healthcare costs from medicines or treatments related to smoking.  Continue to have worsening changes in your lungs, mouth, and nose.  Where to find support: To get support to quit smoking, consider:  Asking your health care provider for more information and resources.  Taking classes to learn more about quitting smoking.  Looking for local organizations that offer resources about quitting smoking.  Joining a support group for people who want to quit smoking in your local community.  Where to find more information: You may find more information about quitting smoking from:  HelpGuide.org: www.helpguide.org/articles/addictions/how-to-quit-smoking.htm  https://hall.com/: smokefree.gov  American Lung Association: www.lung.org  Contact a health care provider if:  You have problems breathing.  Your lips, nose, or fingers turn blue.  You have chest pain.  You are coughing up blood.  You feel faint or you pass out.  You have other noticeable changes that cause you to worry. Summary  Smoking tobacco can negatively affect your health, the health of those around you, your finances, and your social life.  Do not start smoking. Quit if you already do. If you  need help quitting, ask your health care provider.  Think about joining a support group for people who want to quit smoking in your local community. There are many effective programs that will help you to quit this behavior. This information is not intended to replace advice given to you by your health care provider. Make sure you discuss any questions you have with your health care provider. Document Released: 10/09/2016 Document Revised: 10/09/2016 Document Reviewed: 10/09/2016 Elsevier Interactive Patient Education  [employer].    If you need a refill on your cardiac medications before your next appointment, please call your pharmacy.     Signed, Ermalinda Barrios, PA-C  09/10/2017 1:21 PM    [facility] [address], [place],   [address] Phone: [phone]; Fax: [phone]

## 2017-09-10 NOTE — Patient Instructions (Signed)
Medication Instructions:  Your physician recommends that you continue on your current medications as directed. Please refer to the Current Medication list given to you today.  Labwork: None ordered   Testing/Procedures: None ordered   Follow-Up: Your physician wants you to follow-up in: 6 months with Dr. Meda Coffee. You will receive a reminder letter in the mail two months in advance. If you don't receive a letter, please call our office to schedule the follow-up appointment.  Any Other Special Instructions Will Be Listed Below (If Applicable).   Smoking Tobacco Information Smoking tobacco will very likely harm your health. Tobacco contains a poisonous (toxic), colorless chemical called nicotine. Nicotine affects the brain and makes tobacco addictive. This change in your brain can make it hard to stop smoking. Tobacco also has other toxic chemicals that can hurt your body and raise your risk of many cancers. How can smoking tobacco affect me? Smoking tobacco can increase your chances of having serious health conditions, such as:  Cancer. Smoking is most commonly associated with lung cancer, but can lead to cancer in other parts of the body.  Chronic obstructive pulmonary disease (COPD). This is a long-term lung condition that makes it hard to breathe. It also gets worse over time.  High blood pressure (hypertension), heart disease, stroke, or heart attack.  Lung infections, such as pneumonia.  Cataracts. This is when the lenses in the eyes become clouded.  Digestive problems. This may include peptic ulcers, heartburn, and gastroesophageal reflux disease (GERD).  Oral health problems, such as gum disease and tooth loss.  Loss of taste and smell.  Smoking can affect your appearance by causing:  Wrinkles.  Yellow or stained teeth, fingers, and fingernails.  Smoking tobacco can also affect your social life.  Many workplaces, [employer], hotels, and public places are tobacco-free.  This means that you may experience challenges in finding places to smoke when away from home.  The cost of a smoking habit can be expensive. Expenses for someone who smokes come in two ways: ? You spend money on a regular basis to buy tobacco. ? Your health care costs in the long-term are higher if you smoke.  Tobacco smoke can also affect the health of those around you. Children of smokers have greater chances of: ? Sudden infant death syndrome (SIDS). ? Ear infections. ? Lung infections.  What lifestyle changes can be made?  Do not start smoking. Quit if you already do.  To quit smoking: ? Make a plan to quit smoking and commit yourself to it. Look for programs to help you and ask your health care provider for recommendations and ideas. ? Talk with your health care provider about using nicotine replacement medicines to help you quit. Medicine replacement medicines include gum, lozenges, patches, sprays, or pills. ? Do not replace cigarette smoking with electronic cigarettes, which are commonly called e-cigarettes. The safety of e-cigarettes is not known, and some may contain harmful chemicals. ? Avoid places, people, or situations that tempt you to smoke. ? If you try to quit but return to smoking, don't give up hope. It is very common for people to try a number of times before they fully succeed. When you feel ready again, give it another try.  Quitting smoking might affect the way you eat as well as your weight. Be prepared to monitor your eating habits. Get support in planning and following a healthy diet.  Ask your health care provider about having regular tests (screenings) to check for cancer. This may  include blood tests, imaging tests, and other tests.  Exercise regularly. Consider taking walks, joining a gym, or doing yoga or exercise classes.  Develop skills to manage your stress. These skills include meditation. What are the benefits of quitting smoking? By quitting  smoking, you may:  Lower your risk of getting cancer and other diseases caused by smoking.  Live longer.  Breathe better.  Lower your blood pressure and heart rate.  Stop your addiction to tobacco.  Stop creating secondhand smoke that hurts other people.  Improve your sense of taste and smell.  Look better over time, due to having fewer wrinkles and less staining.  What can happen if changes are not made? If you do not stop smoking, you may:  Get cancer and other diseases.  Develop COPD or other long-term (chronic) lung conditions.  Develop serious problems with your heart and blood vessels (cardiovascular system).  Need more tests to screen for problems caused by smoking.  Have higher, long-term healthcare costs from medicines or treatments related to smoking.  Continue to have worsening changes in your lungs, mouth, and nose.  Where to find support: To get support to quit smoking, consider:  Asking your health care provider for more information and resources.  Taking classes to learn more about quitting smoking.  Looking for local organizations that offer resources about quitting smoking.  Joining a support group for people who want to quit smoking in your local community.  Where to find more information: You may find more information about quitting smoking from:  HelpGuide.org: www.helpguide.org/articles/addictions/how-to-quit-smoking.htm  https://hall.com/: smokefree.gov  American Lung Association: www.lung.org  Contact a health care provider if:  You have problems breathing.  Your lips, nose, or fingers turn blue.  You have chest pain.  You are coughing up blood.  You feel faint or you pass out.  You have other noticeable changes that cause you to worry. Summary  Smoking tobacco can negatively affect your health, the health of those around you, your finances, and your social life.  Do not start smoking. Quit if you already do. If you need help  quitting, ask your health care provider.  Think about joining a support group for people who want to quit smoking in your local community. There are many effective programs that will help you to quit this behavior. This information is not intended to replace advice given to you by your health care provider. Make sure you discuss any questions you have with your health care provider. Document Released: 10/09/2016 Document Revised: 10/09/2016 Document Reviewed: 10/09/2016 Elsevier Interactive Patient Education  [employer].    If you need a refill on your cardiac medications before your next appointment, please call your pharmacy.

## 2017-09-12 ENCOUNTER — Other Ambulatory Visit: Payer: Self-pay | Admitting: Acute Care

## 2017-09-12 DIAGNOSIS — Z122 Encounter for screening for malignant neoplasm of respiratory organs: Secondary | ICD-10-CM

## 2017-09-12 DIAGNOSIS — F17210 Nicotine dependence, cigarettes, uncomplicated: Secondary | ICD-10-CM

## 2017-09-18 ENCOUNTER — Other Ambulatory Visit: Payer: Self-pay | Admitting: Pulmonary Disease

## 2017-09-18 DIAGNOSIS — J42 Unspecified chronic bronchitis: Secondary | ICD-10-CM

## 2017-09-18 MED FILL — ATORVASTATIN 40 MG TABLET: 40 | 90 days supply | Qty: 90 | Fill #1

## 2017-09-18 MED FILL — ASPIRIN ADULT LOW STRENGTH: 81 | 90 days supply | Qty: 90 | Fill #1

## 2017-09-18 MED FILL — raNITIdine HCL 300 MG TABS: 300 | 30 days supply | Qty: 30 | Fill #4

## 2017-09-18 MED FILL — VENTOLIN HFA 90 MCG INHALER: 108 (90 BAS | 17 days supply | Qty: 18 | Fill #0

## 2017-09-19 ENCOUNTER — Other Ambulatory Visit: Payer: Self-pay | Admitting: Pharmacist

## 2017-09-19 MED ORDER — ALBUTEROL SULFATE (2.5 MG/3ML) 0.083% IN NEBU: INHALATION_SOLUTION | RESPIRATORY_TRACT | 2 refills | Status: DC

## 2017-09-19 MED FILL — ALBUTEROL 0.083% INHAL SOLN: (2.5 MG/3ML | 6 days supply | Qty: 75 | Fill #0

## 2017-09-23 MED FILL — SPIRIVA 18 MCG CP-HANDIHALE: 18 | 30 days supply | Qty: 30 | Fill #1

## 2017-09-26 MED FILL — BREO ELLIPTA 100-25 MCG INH: 100-25 | 30 days supply | Qty: 60 | Fill #2

## 2017-10-02 ENCOUNTER — Ambulatory Visit: Payer: Medicare HMO | Admitting: Family Medicine

## 2017-10-04 ENCOUNTER — Encounter: Payer: Self-pay | Admitting: Family Medicine

## 2017-10-04 ENCOUNTER — Ambulatory Visit: Payer: Medicare HMO | Attending: Family Medicine | Admitting: Family Medicine

## 2017-10-04 VITALS — BP 133/70 | HR 84 | Temp 98.0°F | Resp 18 | Ht 64.0 in | Wt 111.4 lb

## 2017-10-04 DIAGNOSIS — J42 Unspecified chronic bronchitis: Secondary | ICD-10-CM

## 2017-10-04 DIAGNOSIS — J3089 Other allergic rhinitis: Secondary | ICD-10-CM | POA: Diagnosis not present

## 2017-10-04 DIAGNOSIS — E7800 Pure hypercholesterolemia, unspecified: Secondary | ICD-10-CM | POA: Diagnosis not present

## 2017-10-04 DIAGNOSIS — F17200 Nicotine dependence, unspecified, uncomplicated: Secondary | ICD-10-CM | POA: Diagnosis not present

## 2017-10-04 DIAGNOSIS — Z716 Tobacco abuse counseling: Secondary | ICD-10-CM | POA: Insufficient documentation

## 2017-10-04 DIAGNOSIS — J9610 Chronic respiratory failure, unspecified whether with hypoxia or hypercapnia: Secondary | ICD-10-CM | POA: Insufficient documentation

## 2017-10-04 DIAGNOSIS — F17210 Nicotine dependence, cigarettes, uncomplicated: Secondary | ICD-10-CM | POA: Diagnosis not present

## 2017-10-04 DIAGNOSIS — Z79899 Other long term (current) drug therapy: Secondary | ICD-10-CM | POA: Insufficient documentation

## 2017-10-04 DIAGNOSIS — J440 Chronic obstructive pulmonary disease with acute lower respiratory infection: Secondary | ICD-10-CM | POA: Insufficient documentation

## 2017-10-04 DIAGNOSIS — J9611 Chronic respiratory failure with hypoxia: Secondary | ICD-10-CM

## 2017-10-04 DIAGNOSIS — K219 Gastro-esophageal reflux disease without esophagitis: Secondary | ICD-10-CM

## 2017-10-04 DIAGNOSIS — Z7982 Long term (current) use of aspirin: Secondary | ICD-10-CM | POA: Diagnosis not present

## 2017-10-04 MED ORDER — FLUTICASONE PROPIONATE 50 MCG/ACT NA SUSP: 2.0000 | Freq: Every day | NASAL | 5 refills | Status: DC

## 2017-10-04 MED FILL — FLUTICASONE PROP 50 MCG SPR: 50 | 30 days supply | Qty: 16 | Fill #0

## 2017-10-04 NOTE — Progress Notes (Signed)
6 minute walk test. Lowest saturation level was 87%-93% with exertion. Highest heart rate level was 133 bmp with exertion Patient stats at 99% with 2L of O2 with a heart rate of 101 while at rest.

## 2017-10-04 NOTE — Progress Notes (Signed)
Subjective:  Patient ID: Margaret Henry, female    DOB: 03/05/1951  Age: 66 y.o. MRN: [id number]  CC: Shortness of Breath   HPI Margaret Henry is a 66 year old woman with a history of COPD, tobacco abuse, GERD, hyperlipidemia who presents today for a follow-up visit.  She complains of worsening shortness of breath more so with exertion to the extent that she can hardly walk 10 feet without stopping to rest despite being compliant with her inhalers. Her last visit to her pulmonologist was 3 months ago and she was scheduled to return in 6 months. She continues to smoke but informs me she took her last cigarette this morning and is willing to quit. 1 month ago she was treated for acute bronchitis with azithromycin and prednisone taper at her visit with her cardiologist. CT chest  lung cancer screening from 08/2017 revealed 4.8 mm subpleural nodule in the left lower lobe with recommendation for six-month follow-up.  Reflux symptoms are controlled. She is requesting a refill of Flonase which she uses for allergic rhinitis. Comliant with her statin and denies any adverse effects.   Past Medical History:  Diagnosis Date  . Allergy    hayfever  . Bronchitis   . Cancer ([place])    skin cancer on chest  . Colon polyps   . Complication of anesthesia    pt states was given too much during nasal surgery 1989; difficulty getting awake  . COPD (chronic obstructive pulmonary disease) ([place])   . Diverticulitis   . GERD (gastroesophageal reflux disease)    occasional  . Hemorrhoids   . High cholesterol   . Osteoporosis   . Pneumonia   . Shortness of breath dyspnea    with exertion   . Thyroid goiter    bx benign  . Vertigo     Past Surgical History:  Procedure Laterality Date  . ABDOMINAL HYSTERECTOMY    . BILATERAL OOPHORECTOMY  2001   for benign ovarian mass   . BIOPSY THYROID    . DENTAL SURGERY     dentures  . NASAL SEPTUM SURGERY    . PARTIAL HYSTERECTOMY  1976   for heavy  menses   . RECTAL EXAM UNDER ANESTHESIA N/A 12/14/2015   Procedure: RECTAL EXAM UNDER ANESTHESIA REMOVAL OF ANAL CANAL MASS; INTERNAL HEMORRHOID LIGATION, EXTERNAL HEMORRHOID LIGATION;  Surgeon: Michael Boston, MD;  Location: [facility];  Service: General;  Laterality: N/A;  . TUBAL LIGATION    . WISDOM TOOTH EXTRACTION      Allergies  Allergen Reactions  . Hydrocodone Nausea And Vomiting  . Propoxyphene N-Acetaminophen Nausea And Vomiting     Outpatient Medications Prior to Visit  Medication Sig Dispense Refill  . acetaminophen (TYLENOL) 500 MG tablet Take 1,500 mg by mouth every 4 (four) hours as needed. For headache    . albuterol (PROVENTIL) (2.5 MG/3ML) 0.083% nebulizer solution INHALE 3 MLS (2.5 MG TOTAL) BY NEBULIZATION EVERY 6 HOURS AS NEEDED FOR WHEEZING OR SHORTNESS OF BREATH. 75 mL 2  . albuterol (VENTOLIN HFA) 108 (90 Base) MCG/ACT inhaler INHALE 2 PUFFS BY MOUTH INTO THE LUNGS EVERY 4 (FOUR) HOURS AS NEEDED FOR WHEEZING OR SHORTNESS OF BREATH. 18 g 2  . aspirin EC 81 MG tablet Take 1 tablet (81 mg total) by mouth daily. 90 tablet 3  . atorvastatin (LIPITOR) 40 MG tablet Take 1 tablet (40 mg total) by mouth daily. 90 tablet 3  . BREO ELLIPTA 100-25 MCG/INH AEPB INHALE 1 PUFF  BY MOUTH INTO THE LUNGS DAILY. 60 each 3  . loratadine (CLARITIN) 10 MG tablet TAKE 1 TABLET (10 MG TOTAL) BY MOUTH DAILY. 30 tablet 11  . meclizine (ANTIVERT) 25 MG tablet Take 1 tablet (25 mg total) by mouth 3 (three) times daily as needed for dizziness. 20 tablet 0  . Omega-3 Fatty Acids (FISH OIL) 1000 MG CAPS Take 1 capsule (1,000 mg total) by mouth daily. 30 capsule 6  . Oxymetazoline HCl (SINEX LONG-ACTING NA) Place 2 sprays into the nose daily. Reported on 01/19/2016    . ranitidine (ZANTAC) 300 MG tablet Take 1 tablet (300 mg total) by mouth daily after supper. (30 mins after supper) 30 tablet 5  . Respiratory Therapy Supplies (FLUTTER) DEVI USE AS DIRECTED 1 each 0  . Roflumilast (DALIRESP) 250 MCG TABS  Take 1 Package by mouth as directed. 28 tablet 0  . roflumilast (DALIRESP) 500 MCG TABS tablet Take 1 tablet (500 mcg total) by mouth daily. 30 tablet 11  . SPIRIVA HANDIHALER 18 MCG inhalation capsule PLACE 1 CAPSULE (18 MCG TOTAL) INTO INHALER AND INHALE BY MOUTH DAILY. 30 capsule 2  . VENTOLIN HFA 108 (90 Base) MCG/ACT inhaler INHALE 2 PUFFS BY MOUTH INTO THE LUNGS EVERY 4 (FOUR) HOURS AS NEEDED FOR WHEEZING OR SHORTNESS OF BREATH. 18 g 2  . fluconazole (DIFLUCAN) 150 MG tablet Take 1 tablet (150 mg total) by mouth once a week. 6 tablet 1  . fluticasone (FLONASE) 50 MCG/ACT nasal spray PLACE 2 SPRAYS INTO BOTH NOSTRILS DAILY. 16 g 5   No facility-administered medications prior to visit.     ROS Review of Systems  Constitutional: Negative for activity change, appetite change and fatigue.  HENT: Negative for congestion, sinus pressure and sore throat.   Eyes: Negative for visual disturbance.  Respiratory: Negative for cough, chest tightness, shortness of breath and wheezing.   Cardiovascular: Negative for chest pain and palpitations.  Gastrointestinal: Negative for abdominal distention, abdominal pain and constipation.  Endocrine: Negative for polydipsia.  Genitourinary: Negative for dysuria and frequency.  Musculoskeletal: Negative for arthralgias and back pain.  Skin: Negative for rash.  Neurological: Negative for tremors, light-headedness and numbness.  Hematological: Does not bruise/bleed easily.  Psychiatric/Behavioral: Negative for agitation and behavioral problems.    Objective:  BP 133/70 (BP Location: Left Arm, Patient Position: Sitting, Cuff Size: Normal)   Pulse 84   Temp 98 F (36.7 C) (Oral)   Resp 18   Ht 5\' 4"  (1.626 m)   Wt 111 lb 6.4 oz (50.5 kg)   SpO2 97%   BMI 19.12 kg/m   BP/Weight 10/04/2017 09/10/2017 44/12/4740  Systolic BP 595 638 756  Diastolic BP 70 60 64  Wt. (Lbs) 111.4 111 113  BMI 19.12 19.05 19.4      Physical Exam  Constitutional: She  is oriented to person, place, and time. She appears well-developed and well-nourished.  Cardiovascular: Normal rate, normal heart sounds and intact distal pulses.  No murmur heard. Pulmonary/Chest: Effort normal and breath sounds normal. She has no wheezes. She has no rales. She exhibits no tenderness.  Abdominal: Soft. Bowel sounds are normal. She exhibits no distension and no mass. There is no tenderness.  Musculoskeletal: Normal range of motion.  Neurological: She is alert and oriented to person, place, and time.  Skin: Skin is warm and dry.  Psychiatric: She has a normal mood and affect.     Assessment & Plan:   1. Chronic bronchitis, unspecified chronic bronchitis type ([place])  Uncontrolled Will likely benefit from oxygen supplementation; 6-minute walk test revealed desaturation to 87% on exertion Continue Spiriva, Breo, Proventil Advised that smoking cessation will retard progression of condition Keep appointment with pulmonary  2. Chronic respiratory failure with hypoxia (HCC) We will [facility] prescription to [facility] for oxygen as she is currently symptomatic  3. TOBACCO ABUSE Spent 3 minutes counseling on cessation and hazardous effect of tobacco abuse and she is willing to quit  4. Gastroesophageal reflux disease, esophagitis presence not specified Controlled Avoid late meals  5. Non-seasonal allergic rhinitis due to other allergic trigger Stable - fluticasone (FLONASE) 50 MCG/ACT nasal spray; Place 2 sprays into both nostrils daily.  Dispense: 16 g; Refill: 5   Meds ordered this encounter  Medications  . fluticasone (FLONASE) 50 MCG/ACT nasal spray    Sig: Place 2 sprays into both nostrils daily.    Dispense:  16 g    Refill:  5    Follow-up: Return in about 3 months (around 01/02/2018) for follow up of chronic medical conditions.   Arnoldo Morale MD

## 2017-10-09 MED FILL — VENTOLIN HFA 90 MCG INHALER: 108 (90 BAS | 17 days supply | Qty: 18 | Fill #1

## 2017-10-09 MED FILL — ALBUTEROL 0.083% INHAL SOLN: (2.5 MG/3ML | 6 days supply | Qty: 75 | Fill #1

## 2017-10-15 ENCOUNTER — Telehealth: Payer: Self-pay

## 2017-10-15 NOTE — Telephone Encounter (Signed)
Prescription for O2 faxed to [facility]

## 2017-10-15 NOTE — Telephone Encounter (Signed)
Call received from Oran Rein, [facility] # [phone] regarding the O2 request. She stated that additional information is needed for the order and was faxing this CM the required documentation.  This information was shared with Dr Jarold Song.

## 2017-10-16 ENCOUNTER — Telehealth: Payer: Self-pay

## 2017-10-16 NOTE — Telephone Encounter (Signed)
Patient is schedule for a nurse visit on 10/23/17 @ 1430 for O2 testing .

## 2017-10-23 ENCOUNTER — Ambulatory Visit: Payer: Medicare HMO | Attending: Family Medicine

## 2017-10-23 DIAGNOSIS — J9611 Chronic respiratory failure with hypoxia: Secondary | ICD-10-CM | POA: Insufficient documentation

## 2017-10-23 DIAGNOSIS — Z9981 Dependence on supplemental oxygen: Secondary | ICD-10-CM

## 2017-10-23 NOTE — Progress Notes (Signed)
Pt arrived to clinic to have an O2 stat testing done.  Room air-93% Room air exertion:84% 02 during nasal cannula during exertion-97%.

## 2017-10-24 ENCOUNTER — Ambulatory Visit (INDEPENDENT_AMBULATORY_CARE_PROVIDER_SITE_OTHER): Payer: Medicare HMO | Admitting: Podiatry

## 2017-10-24 ENCOUNTER — Other Ambulatory Visit: Payer: Self-pay | Admitting: Family Medicine

## 2017-10-24 ENCOUNTER — Telehealth: Payer: Self-pay

## 2017-10-24 DIAGNOSIS — B351 Tinea unguium: Secondary | ICD-10-CM | POA: Diagnosis not present

## 2017-10-24 DIAGNOSIS — L603 Nail dystrophy: Secondary | ICD-10-CM

## 2017-10-24 NOTE — Progress Notes (Signed)
  Subjective:  Patient ID: Margaret Henry, female    DOB: 26-May-1951,  MRN: [id number]  Chief Complaint  Patient presents with  . Nail Problem    3 month nail trim    67 y.o. female presents for evaluation of left great toenail fungus.  Patient would like to proceed with removal of the left great toenail she had done for her right side. Review of Systems Objective:  There were no vitals filed for this visit. General AA&O x3. Normal mood and affect.  Vascular Dorsalis pedis and posterior tibial pulses  present 2+ bilaterally  Capillary refill normal to all digits. Pedal hair growth normal.  Neurologic Epicritic sensation grossly present.  Dermatologic No open lesions. Interspaces clear of maceration. Nails well groomed and normal in appearance. Mycosis left great toenail.  Right great toenail healing well with some evidence of nail dystrophy  Orthopedic: MMT 5/5 in dorsiflexion, plantarflexion, inversion, and eversion. Normal joint ROM without pain or crepitus. Pain to palpation about the ingrown nail.   Assessment & Plan:  Patient was evaluated and treated and all questions answered.  Onychomycosis left great toe -Patient elects to proceed with avulsion of the toenail. -Left hallux nail avulsed. See procedure note. -Educated on post-procedure care including soaking. Written instructions provided. -Patient to follow up in 2 weeks for nail check.  Procedure: Avulsion of Toenail Location: Left hallux nail Anesthesia: Lidocaine 1% plain; 1.5 mL and Marcaine 0.5% plain; 1.5 mL, digital block. Skin Prep: Betadine. Dressing: Silvadene; telfa; dry, sterile, compression dressing. Technique: Following skin prep, the toe was exsanguinated and a tourniquet was secured at the base of the toe. The nail was freed and avulsed. The tourniquet was then removed and sterile dressing applied. Disposition: Patient tolerated procedure well. Patient to return in 2 weeks for follow-up.   Return in  about 2 weeks (around 11/07/2017) for Nail Check.

## 2017-10-24 NOTE — Patient Instructions (Signed)

## 2017-10-24 NOTE — Telephone Encounter (Signed)
O2 testing results faxed to Oran Rein, [facility].

## 2017-10-25 ENCOUNTER — Telehealth: Payer: Self-pay | Admitting: Family Medicine

## 2017-10-25 NOTE — Telephone Encounter (Signed)
Call placed to [place] [phone] regarding patient's referral for O2. Spoke with Shanon Brow and he informed me that the referral  was received yesterday. A ticket was sent to the logistic team and they will be handling it now.

## 2017-10-28 MED FILL — VENTOLIN HFA 90 MCG INHALER: 108 (90 BAS | 17 days supply | Qty: 18 | Fill #2

## 2017-10-28 MED FILL — SPIRIVA 18 MCG CP-HANDIHALE: 18 | 30 days supply | Qty: 30 | Fill #2

## 2017-10-28 MED FILL — raNITIdine HCL 300 MG TABS: 300 | 30 days supply | Qty: 30 | Fill #5

## 2017-10-28 MED FILL — BREO ELLIPTA 100-25 MCG INH: 100-25 | 30 days supply | Qty: 60 | Fill #3

## 2017-10-28 MED FILL — LORATADINE 10 MG TABLET: 10 | 100 days supply | Qty: 100 | Fill #2

## 2017-10-28 MED FILL — ALBUTEROL 0.083% INHAL SOLN: (2.5 MG/3ML | 6 days supply | Qty: 75 | Fill #2

## 2017-10-28 MED FILL — FLUTICASONE PROP 50 MCG SPR: 50 | 30 days supply | Qty: 16 | Fill #5

## 2017-11-07 ENCOUNTER — Encounter: Payer: Self-pay | Admitting: Podiatry

## 2017-11-07 ENCOUNTER — Ambulatory Visit (INDEPENDENT_AMBULATORY_CARE_PROVIDER_SITE_OTHER): Payer: Medicare HMO | Admitting: Podiatry

## 2017-11-07 DIAGNOSIS — L603 Nail dystrophy: Secondary | ICD-10-CM | POA: Diagnosis not present

## 2017-11-07 DIAGNOSIS — B351 Tinea unguium: Secondary | ICD-10-CM | POA: Diagnosis not present

## 2017-11-07 NOTE — Progress Notes (Signed)
  Subjective:  Patient ID: Margaret Henry, female    DOB: [birth year],  MRN: [id number]  No chief complaint on file.  67 y.o. female returns for the above complaint.  States that the toenail is doing well.  Objective:   General AA&O x3. Normal mood and affect.  Vascular Foot warm and well perfused with good capillary refill.  Neurologic Sensation grossly intact.  Dermatologic Nail avulsion site healing well without drainage or erythema. Nail bed with overlying soft crust. Left intact. No signs of local infection.  Orthopedic: Slight tenderness tenderness to palpation of the toe.   Assessment & Plan:  Patient was evaluated and treated and all questions answered.  S/p left hallux total nail avulsion -Healing well without issue. -Discussed return precautions. -F/u PRN

## 2017-11-26 ENCOUNTER — Other Ambulatory Visit: Payer: Self-pay | Admitting: Pulmonary Disease

## 2017-11-26 ENCOUNTER — Other Ambulatory Visit: Payer: Self-pay | Admitting: Family Medicine

## 2017-11-26 DIAGNOSIS — J42 Unspecified chronic bronchitis: Secondary | ICD-10-CM

## 2017-11-26 MED FILL — VENTOLIN HFA 90 MCG INHALER: 108 (90 BAS | 17 days supply | Qty: 18 | Fill #0

## 2017-11-26 MED FILL — ALBUTEROL 0.083% INHAL SOLN: (2.5 MG/3ML | 6 days supply | Qty: 75 | Fill #0

## 2017-11-26 MED FILL — BREO ELLIPTA 100-25 MCG INH: 100-25 | 30 days supply | Qty: 60 | Fill #0

## 2017-11-27 ENCOUNTER — Other Ambulatory Visit: Payer: Self-pay | Admitting: Pharmacist

## 2017-11-27 DIAGNOSIS — J42 Unspecified chronic bronchitis: Secondary | ICD-10-CM

## 2017-11-27 MED ORDER — SPIRIVA HANDIHALER 18 MCG IN CAPS: ORAL_CAPSULE | RESPIRATORY_TRACT | 2 refills | Status: DC

## 2017-11-27 MED FILL — SPIRIVA 18 MCG CP-HANDIHALE: 18 | 30 days supply | Qty: 30 | Fill #0

## 2017-12-02 ENCOUNTER — Other Ambulatory Visit: Payer: Self-pay | Admitting: Pharmacist

## 2017-12-02 DIAGNOSIS — K219 Gastro-esophageal reflux disease without esophagitis: Secondary | ICD-10-CM

## 2017-12-02 MED ORDER — RANITIDINE HCL 300 MG PO TABS: 300.0000 mg | ORAL_TABLET | Freq: Every day | ORAL | 2 refills | Status: DC

## 2017-12-02 MED FILL — raNITIdine HCL 300 MG TABS: 300 | 30 days supply | Qty: 30 | Fill #0

## 2017-12-25 MED FILL — SPIRIVA 18 MCG CP-HANDIHALE: 18 | 30 days supply | Qty: 30 | Fill #1

## 2017-12-25 MED FILL — BREO ELLIPTA 100-25 MCG INH: 100-25 | 30 days supply | Qty: 60 | Fill #1

## 2017-12-25 MED FILL — VENTOLIN HFA 90 MCG INHALER: 108 (90 BAS | 17 days supply | Qty: 18 | Fill #1

## 2017-12-25 MED FILL — FLUTICASONE PROP 50 MCG SPR: 50 | 30 days supply | Qty: 16 | Fill #1

## 2017-12-25 MED FILL — ALBUTEROL 0.083% INHAL SOLN: (2.5 MG/3ML | 6 days supply | Qty: 75 | Fill #1

## 2018-01-03 ENCOUNTER — Encounter: Payer: Self-pay | Admitting: Family Medicine

## 2018-01-03 ENCOUNTER — Ambulatory Visit: Payer: Medicare HMO | Attending: Family Medicine | Admitting: Family Medicine

## 2018-01-03 ENCOUNTER — Encounter: Payer: Self-pay | Admitting: Pulmonary Disease

## 2018-01-03 ENCOUNTER — Other Ambulatory Visit: Payer: Self-pay

## 2018-01-03 ENCOUNTER — Telehealth: Payer: Self-pay | Admitting: Acute Care

## 2018-01-03 ENCOUNTER — Ambulatory Visit (INDEPENDENT_AMBULATORY_CARE_PROVIDER_SITE_OTHER): Payer: Medicare HMO | Admitting: Pulmonary Disease

## 2018-01-03 VITALS — BP 126/77 | HR 94 | Temp 97.8°F | Resp 18 | Ht 64.0 in | Wt 107.6 lb

## 2018-01-03 VITALS — BP 120/70 | HR 104 | Ht 64.0 in | Wt 107.4 lb

## 2018-01-03 DIAGNOSIS — F17210 Nicotine dependence, cigarettes, uncomplicated: Secondary | ICD-10-CM | POA: Diagnosis not present

## 2018-01-03 DIAGNOSIS — J42 Unspecified chronic bronchitis: Secondary | ICD-10-CM

## 2018-01-03 DIAGNOSIS — J449 Chronic obstructive pulmonary disease, unspecified: Secondary | ICD-10-CM | POA: Diagnosis not present

## 2018-01-03 DIAGNOSIS — Z7982 Long term (current) use of aspirin: Secondary | ICD-10-CM | POA: Diagnosis not present

## 2018-01-03 DIAGNOSIS — Z79899 Other long term (current) drug therapy: Secondary | ICD-10-CM | POA: Diagnosis not present

## 2018-01-03 DIAGNOSIS — Z90711 Acquired absence of uterus with remaining cervical stump: Secondary | ICD-10-CM | POA: Diagnosis not present

## 2018-01-03 DIAGNOSIS — K219 Gastro-esophageal reflux disease without esophagitis: Secondary | ICD-10-CM | POA: Diagnosis not present

## 2018-01-03 DIAGNOSIS — Z1239 Encounter for other screening for malignant neoplasm of breast: Secondary | ICD-10-CM

## 2018-01-03 DIAGNOSIS — Z85828 Personal history of other malignant neoplasm of skin: Secondary | ICD-10-CM | POA: Insufficient documentation

## 2018-01-03 DIAGNOSIS — Z1231 Encounter for screening mammogram for malignant neoplasm of breast: Secondary | ICD-10-CM | POA: Diagnosis not present

## 2018-01-03 DIAGNOSIS — E7800 Pure hypercholesterolemia, unspecified: Secondary | ICD-10-CM | POA: Insufficient documentation

## 2018-01-03 DIAGNOSIS — R05 Cough: Secondary | ICD-10-CM | POA: Insufficient documentation

## 2018-01-03 DIAGNOSIS — J9611 Chronic respiratory failure with hypoxia: Secondary | ICD-10-CM | POA: Insufficient documentation

## 2018-01-03 DIAGNOSIS — Z9981 Dependence on supplemental oxygen: Secondary | ICD-10-CM | POA: Diagnosis not present

## 2018-01-03 DIAGNOSIS — Z885 Allergy status to narcotic agent status: Secondary | ICD-10-CM | POA: Insufficient documentation

## 2018-01-03 DIAGNOSIS — E785 Hyperlipidemia, unspecified: Secondary | ICD-10-CM | POA: Insufficient documentation

## 2018-01-03 DIAGNOSIS — Z13228 Encounter for screening for other metabolic disorders: Secondary | ICD-10-CM

## 2018-01-03 MED ORDER — SPIRIVA HANDIHALER 18 MCG IN CAPS: ORAL_CAPSULE | RESPIRATORY_TRACT | 2 refills | Status: DC

## 2018-01-03 MED ORDER — RANITIDINE HCL 300 MG PO TABS: 300.0000 mg | ORAL_TABLET | Freq: Every day | ORAL | 3 refills | Status: DC

## 2018-01-03 MED ORDER — FLUTICASONE FUROATE-VILANTEROL 100-25 MCG/INH IN AEPB: INHALATION_SPRAY | RESPIRATORY_TRACT | 2 refills | Status: DC

## 2018-01-03 MED ORDER — ALBUTEROL SULFATE (2.5 MG/3ML) 0.083% IN NEBU: 2.5000 mg | INHALATION_SOLUTION | Freq: Four times a day (QID) | RESPIRATORY_TRACT | 2 refills | Status: DC | PRN

## 2018-01-03 MED ORDER — ROFLUMILAST 500 MCG PO TABS: 500.0000 ug | ORAL_TABLET | Freq: Every day | ORAL | 11 refills | Status: DC

## 2018-01-03 MED ORDER — BENZONATATE 100 MG PO CAPS: 100.0000 mg | ORAL_CAPSULE | Freq: Two times a day (BID) | ORAL | 1 refills | Status: DC | PRN

## 2018-01-03 MED ORDER — ALBUTEROL SULFATE HFA 108 (90 BASE) MCG/ACT IN AERS: INHALATION_SPRAY | RESPIRATORY_TRACT | 2 refills | Status: DC

## 2018-01-03 MED FILL — raNITIdine HCL 300 MG TABS: 300 | 60 days supply | Qty: 60 | Fill #0

## 2018-01-03 MED FILL — DALIRESP 500 MCG TABLET: 500 | 30 days supply | Qty: 30 | Fill #0

## 2018-01-03 MED FILL — BENZONATATE 100 MG CAPSULE: 100 | 20 days supply | Qty: 40 | Fill #0

## 2018-01-03 NOTE — Telephone Encounter (Signed)
Attempted to call pt but no answer. Left detailed message on pt's machine stating we would call her as it got closer to time for CT scan to be done to schedule it for her.  Nothing further needed at this time.

## 2018-01-03 NOTE — Telephone Encounter (Signed)
Called patient unable to reach left message to give us a call back.

## 2018-01-03 NOTE — Progress Notes (Signed)
Subjective:  Patient ID: Margaret Henry, female    DOB: 1950-11-30  Age: 67 y.o. MRN: [id number]  CC: Follow-up   HPI Margaret Henry  is a 67 year old woman with a history of COPD, tobacco abuse, GERD, hyperlipidemia who presents today for a follow-up visit. At her last visit she was commenced on oxygen and is currently on 2 L at rest via nasal cannula and reports improvement in her dyspnea but she continues to cough with associated production of whitish phlegm and coughing "takes her breath out." She has cut back on her smoking and smokes 1 cigarette/day. Seen by her pulmonologist, Dr Marshell Garfinkel today and restarted on Daliresp and she continues with her Spiriva, Breo and Proventil as needed.  With regards to her GERD symptoms are controlled on ranitidine and she tolerates her atorvastatin which she takes for hypercholesterolemia; denies myalgias or other adverse effects. Doing well on Flonase which she uses for sinusitis. She has no additional concerns today.  Past Medical History:  Diagnosis Date  . Allergy    hayfever  . Bronchitis   . Cancer ([place])    skin cancer on chest  . Colon polyps   . Complication of anesthesia    pt states was given too much during nasal surgery 1989; difficulty getting awake  . COPD (chronic obstructive pulmonary disease) ([place])   . Diverticulitis   . GERD (gastroesophageal reflux disease)    occasional  . Hemorrhoids   . High cholesterol   . Osteoporosis   . Pneumonia   . Shortness of breath dyspnea    with exertion   . Thyroid goiter    bx benign  . Vertigo     Past Surgical History:  Procedure Laterality Date  . ABDOMINAL HYSTERECTOMY    . BILATERAL OOPHORECTOMY  2001   for benign ovarian mass   . BIOPSY THYROID    . DENTAL SURGERY     dentures  . NASAL SEPTUM SURGERY    . PARTIAL HYSTERECTOMY  1976   for heavy menses   . RECTAL EXAM UNDER ANESTHESIA N/A 12/14/2015   Procedure: RECTAL EXAM UNDER ANESTHESIA REMOVAL OF ANAL CANAL  MASS; INTERNAL HEMORRHOID LIGATION, EXTERNAL HEMORRHOID LIGATION;  Surgeon: Michael Boston, MD;  Location: [facility];  Service: General;  Laterality: N/A;  . TUBAL LIGATION    . WISDOM TOOTH EXTRACTION      Family History  Problem Relation Age of Onset  . Heart disease Mother   . COPD Father   . Lung cancer Maternal Grandfather   . Liver cancer Paternal Grandmother   . Colon cancer Neg Hx   . Colon polyps Neg Hx   . Esophageal cancer Neg Hx   . Rectal cancer Neg Hx   . Stomach cancer Neg Hx     Allergies  Allergen Reactions  . Hydrocodone Nausea And Vomiting  . Propoxyphene N-Acetaminophen Nausea And Vomiting     Outpatient Medications Prior to Visit  Medication Sig Dispense Refill  . acetaminophen (TYLENOL) 500 MG tablet Take 1,500 mg by mouth every 4 (four) hours as needed. For headache    . aspirin EC 81 MG tablet Take 1 tablet (81 mg total) by mouth daily. 90 tablet 3  . atorvastatin (LIPITOR) 40 MG tablet Take 1 tablet (40 mg total) by mouth daily. 90 tablet 3  . fluticasone (FLONASE) 50 MCG/ACT nasal spray Place 2 sprays into both nostrils daily. 16 g 5  . loratadine (CLARITIN) 10 MG tablet TAKE  1 TABLET (10 MG TOTAL) BY MOUTH DAILY. 30 tablet 11  . meclizine (ANTIVERT) 25 MG tablet Take 1 tablet (25 mg total) by mouth 3 (three) times daily as needed for dizziness. 20 tablet 0  . Omega-3 Fatty Acids (FISH OIL) 1000 MG CAPS Take 1 capsule (1,000 mg total) by mouth daily. 30 capsule 6  . Oxymetazoline HCl (SINEX LONG-ACTING NA) Place 2 sprays into the nose daily. Reported on 01/19/2016    . Respiratory Therapy Supplies (FLUTTER) DEVI USE AS DIRECTED 1 each 0  . roflumilast (DALIRESP) 500 MCG TABS tablet Take 1 tablet (500 mcg total) by mouth daily. 30 tablet 11  . VENTOLIN HFA 108 (90 Base) MCG/ACT inhaler INHALE 2 PUFFS BY MOUTH INTO THE LUNGS EVERY 4 (FOUR) HOURS AS NEEDED FOR WHEEZING OR SHORTNESS OF BREATH. 1 Inhaler 1  . albuterol (PROVENTIL) (2.5 MG/3ML) 0.083% nebulizer  solution INHALE 3 MLS (2.5 MG TOTAL) BY NEBULIZATION EVERY 6 HOURS AS NEEDED FOR WHEEZING OR SHORTNESS OF BREATH. 75 mL 2  . albuterol (VENTOLIN HFA) 108 (90 Base) MCG/ACT inhaler INHALE 2 PUFFS BY MOUTH INTO THE LUNGS EVERY 4 (FOUR) HOURS AS NEEDED FOR WHEEZING OR SHORTNESS OF BREATH. 18 g 2  . BREO ELLIPTA 100-25 MCG/INH AEPB INHALE 1 PUFF BY MOUTH INTO THE LUNGS DAILY. 60 each 1  . ranitidine (ZANTAC) 300 MG tablet Take 1 tablet (300 mg total) by mouth daily after supper. (30 mins after supper) 30 tablet 2  . SPIRIVA HANDIHALER 18 MCG inhalation capsule PLACE 1 CAPSULE (18 MCG TOTAL) INTO INHALER AND INHALE BY MOUTH DAILY. 30 capsule 2   No facility-administered medications prior to visit.     ROS Review of Systems  Constitutional: Negative for activity change, appetite change and fatigue.  HENT: Negative for congestion, sinus pressure and sore throat.   Eyes: Negative for visual disturbance.  Respiratory: Positive for shortness of breath. Negative for cough, chest tightness and wheezing.   Cardiovascular: Negative for chest pain and palpitations.  Gastrointestinal: Negative for abdominal distention, abdominal pain and constipation.  Endocrine: Negative for polydipsia.  Genitourinary: Negative for dysuria and frequency.  Musculoskeletal: Negative for arthralgias and back pain.  Skin: Negative for rash.  Neurological: Negative for tremors, light-headedness and numbness.  Hematological: Does not bruise/bleed easily.  Psychiatric/Behavioral: Negative for agitation and behavioral problems.    Objective:  BP 126/77 (BP Location: Left Arm, Patient Position: Sitting, Cuff Size: Normal)   Pulse 94   Temp 97.8 F (36.6 C) (Oral)   Resp 18   Ht 5' 4"  (1.626 m)   Wt 107 lb 9.6 oz (48.8 kg)   SpO2 92%   BMI 18.47 kg/m   BP/Weight 01/03/2018 01/03/2018 24/58/0998  Systolic BP 338 250 539  Diastolic BP 70 77 70  Wt. (Lbs) 107.4 107.6 111.4  BMI 18.44 18.47 19.12      Physical Exam    Constitutional: She is oriented to person, place, and time. She appears well-developed and well-nourished.  HENT:  On 2 L of oxygen via nasal cannula  Cardiovascular: Normal rate, normal heart sounds and intact distal pulses.  No murmur heard. Pulmonary/Chest: Effort normal and breath sounds normal. She has no wheezes. She has no rales. She exhibits no tenderness.  Abdominal: Soft. Bowel sounds are normal. She exhibits no distension and no mass. There is no tenderness.  Musculoskeletal: Normal range of motion.  Neurological: She is alert and oriented to person, place, and time.  Skin: Skin is warm and dry.  Psychiatric:  She has a normal mood and affect.    CMP Latest Ref Rng & Units 04/18/2017 09/05/2016 03/26/2016  Glucose 65 - 99 mg/dL 89 78 90  BUN 8 - 27 mg/dL 13 11 11   Creatinine 0.57 - 1.00 mg/dL 0.59 0.60 0.66  Sodium 134 - 144 mmol/L 145(H) 142 143  Potassium 3.5 - 5.2 mmol/L 4.7 4.0 3.9  Chloride 96 - 106 mmol/L 105 106 108  CO2 20 - 29 mmol/L 24 27 27   Calcium 8.7 - 10.3 mg/dL 9.7 9.6 9.8  Total Protein 6.0 - 8.5 g/dL 7.3 7.3 7.3  Total Bilirubin 0.0 - 1.2 mg/dL 0.4 0.4 0.4  Alkaline Phos 39 - 117 IU/L 76 68 56  AST 0 - 40 IU/L 14 15 12   ALT 0 - 32 IU/L 10 11 9     Lipid Panel     Component Value Date/Time   CHOL 177 09/05/2016 1218   TRIG 194 (H) 09/05/2016 1218   HDL 57 09/05/2016 1218   CHOLHDL 3.1 09/05/2016 1218   VLDL 39 (H) 09/05/2016 1218   LDLCALC 81 09/05/2016 1218   LDLDIRECT 166 (H) 09/26/2015 1001    Assessment & Plan:   1. Chronic bronchitis, unspecified chronic bronchitis type ([place]) Uncontrolled, currently on oxygen replacement therapy Strongly encouraged to completely quit smoking; she has cut back significantly will place on Tessalon Tessalon Perles for chronic cough Follow-up with pulmonary As scheduled - SPIRIVA HANDIHALER 18 MCG inhalation capsule; PLACE 1 CAPSULE (18 MCG TOTAL) INTO INHALER AND INHALE BY MOUTH DAILY.  Dispense: 30 capsule;  Refill: 2 - fluticasone furoate-vilanterol (BREO ELLIPTA) 100-25 MCG/INH AEPB; INHALE 1 PUFF BY MOUTH INTO THE LUNGS DAILY.  Dispense: 60 each; Refill: 2 - albuterol (VENTOLIN HFA) 108 (90 Base) MCG/ACT inhaler; INHALE 2 PUFFS BY MOUTH INTO THE LUNGS EVERY 4 (FOUR) HOURS AS NEEDED FOR WHEEZING OR SHORTNESS OF BREATH.  Dispense: 18 g; Refill: 2 - albuterol (PROVENTIL) (2.5 MG/3ML) 0.083% nebulizer solution; Take 3 mLs (2.5 mg total) by nebulization every 6 (six) hours as needed for wheezing or shortness of breath.  Dispense: 75 mL; Refill: 2 - benzonatate (TESSALON) 100 MG capsule; Take 1 capsule (100 mg total) by mouth 2 (two) times daily as needed for cough.  Dispense: 40 capsule; Refill: 1  2. Gastroesophageal reflux disease, esophagitis presence not specified Stable - ranitidine (ZANTAC) 300 MG tablet; Take 1 tablet (300 mg total) by mouth daily after supper. (30 mins after supper)  Dispense: 60 tablet; Refill: 3  3. Screening for breast cancer - MM Digital Screening; Future  4. Chronic respiratory failure with hypoxia (HCC) Remains on 2 L of oxygen at rest  5.Hyperlipidemia Uncontrolled Continue atorvastatin Low cholesterol diet - CMP14+EGFR; Future - Lipid panel; Future   Meds ordered this encounter  Medications  . SPIRIVA HANDIHALER 18 MCG inhalation capsule    Sig: PLACE 1 CAPSULE (18 MCG TOTAL) INTO INHALER AND INHALE BY MOUTH DAILY.    Dispense:  30 capsule    Refill:  2  . fluticasone furoate-vilanterol (BREO ELLIPTA) 100-25 MCG/INH AEPB    Sig: INHALE 1 PUFF BY MOUTH INTO THE LUNGS DAILY.    Dispense:  60 each    Refill:  2  . albuterol (VENTOLIN HFA) 108 (90 Base) MCG/ACT inhaler    Sig: INHALE 2 PUFFS BY MOUTH INTO THE LUNGS EVERY 4 (FOUR) HOURS AS NEEDED FOR WHEEZING OR SHORTNESS OF BREATH.    Dispense:  18 g    Refill:  2  . albuterol (  PROVENTIL) (2.5 MG/3ML) 0.083% nebulizer solution    Sig: Take 3 mLs (2.5 mg total) by nebulization every 6 (six) hours as needed  for wheezing or shortness of breath.    Dispense:  75 mL    Refill:  2  . ranitidine (ZANTAC) 300 MG tablet    Sig: Take 1 tablet (300 mg total) by mouth daily after supper. (30 mins after supper)    Dispense:  60 tablet    Refill:  3  . benzonatate (TESSALON) 100 MG capsule    Sig: Take 1 capsule (100 mg total) by mouth 2 (two) times daily as needed for cough.    Dispense:  40 capsule    Refill:  1    Follow-up: Return in about 3 months (around 04/05/2018) for follow up of chronic medical conditions.   [name] MD

## 2018-01-03 NOTE — Progress Notes (Signed)
Margaret Henry    [id number]    01-11-1951  Primary Care Physician:Newlin, Charlane Ferretti, MD  Referring Physician: [name], MD [place], [place] [address]  Chief complaint:   Follow up for COPD GOLD D  HPI: [name] is a 67 year old with history of COPD GOLD D (CAT score 3, multiple exacerbations)diagnosed in 2014. Her symptoms consist mainly of shortness of breath, daily cough with whitish sputum production, wheeze, dyspnea and exertion. She does not have any hemoptysis, chest pain, palpitations. No fevers, chills.  She is currently maintained on Spiriva and Advair. She is a current smoker. She had smoked one pack per day for nearly 25 years. She is trying to quit and is down to 1 pack every week. She had used the nicotine patch in the past. 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 She does not recall any particular exposures at work or at home.  Interim history: Started on supplemental oxygen by her primary care due to hypoxia.  Currently on 2 L 24/7 Continues on Breo and Spiriva.  Has good days and bad days with her breathing with chronic cough, sputum production, dyspnea on exertion.  Started on Daliresp at last visit but has stopped taking it she ran out of the prescription.  Outpatient Encounter Medications as of 01/03/2018  Medication Sig  . acetaminophen (TYLENOL) 500 MG tablet Take 1,500 mg by mouth every 4 (four) hours as needed. For headache  . albuterol (PROVENTIL) (2.5 MG/3ML) 0.083% nebulizer solution INHALE 3 MLS (2.5 MG TOTAL) BY NEBULIZATION EVERY 6 HOURS AS NEEDED FOR WHEEZING OR SHORTNESS OF BREATH.  Marland Kitchen albuterol (VENTOLIN HFA) 108 (90 Base) MCG/ACT inhaler INHALE 2 PUFFS BY MOUTH INTO THE LUNGS EVERY 4 (FOUR) HOURS AS NEEDED FOR WHEEZING OR SHORTNESS OF BREATH.  Marland Kitchen aspirin EC 81 MG tablet Take 1 tablet (81 mg total) by mouth daily.  Marland Kitchen atorvastatin (LIPITOR) 40 MG tablet Take 1 tablet (40 mg total) by mouth  daily.  Marland Kitchen BREO ELLIPTA 100-25 MCG/INH AEPB INHALE 1 PUFF BY MOUTH INTO THE LUNGS DAILY.  . fluticasone (FLONASE) 50 MCG/ACT nasal spray Place 2 sprays into both nostrils daily.  Marland Kitchen loratadine (CLARITIN) 10 MG tablet TAKE 1 TABLET (10 MG TOTAL) BY MOUTH DAILY.  . meclizine (ANTIVERT) 25 MG tablet Take 1 tablet (25 mg total) by mouth 3 (three) times daily as needed for dizziness.  . Omega-3 Fatty Acids (FISH OIL) 1000 MG CAPS Take 1 capsule (1,000 mg total) by mouth daily.  . Oxymetazoline HCl (SINEX LONG-ACTING NA) Place 2 sprays into the nose daily. Reported on 01/19/2016  . ranitidine (ZANTAC) 300 MG tablet Take 1 tablet (300 mg total) by mouth daily after supper. (30 mins after supper)  . Respiratory Therapy Supplies (FLUTTER) DEVI USE AS DIRECTED  . SPIRIVA HANDIHALER 18 MCG inhalation capsule PLACE 1 CAPSULE (18 MCG TOTAL) INTO INHALER AND INHALE BY MOUTH DAILY.  . VENTOLIN HFA 108 (90 Base) MCG/ACT inhaler INHALE 2 PUFFS BY MOUTH INTO THE LUNGS EVERY 4 (FOUR) HOURS AS NEEDED FOR WHEEZING OR SHORTNESS OF BREATH.  . [DISCONTINUED] Roflumilast (DALIRESP) 250 MCG TABS Take 1 Package by mouth as directed.  . roflumilast (DALIRESP) 500 MCG TABS tablet Take 1 tablet (500 mcg total) by mouth daily. (Patient not taking: Reported on 01/03/2018)  . [DISCONTINUED] simvastatin (ZOCOR) 80 MG tablet Take 80 mg by mouth at bedtime.   No facility-administered encounter medications on  file as of 01/03/2018.     Allergies as of 01/03/2018 - Review Complete 01/03/2018  Allergen Reaction Noted  . Hydrocodone Nausea And Vomiting   . Propoxyphene n-acetaminophen Nausea And Vomiting     Past Medical History:  Diagnosis Date  . Allergy    hayfever  . Bronchitis   . Cancer ([place])    skin cancer on chest  . Colon polyps   . Complication of anesthesia    pt states was given too much during nasal surgery 1989; difficulty getting awake  . COPD (chronic obstructive pulmonary disease) ([place])   . Diverticulitis     . GERD (gastroesophageal reflux disease)    occasional  . Hemorrhoids   . High cholesterol   . Osteoporosis   . Pneumonia   . Shortness of breath dyspnea    with exertion   . Thyroid goiter    bx benign  . Vertigo     Past Surgical History:  Procedure Laterality Date  . ABDOMINAL HYSTERECTOMY    . BILATERAL OOPHORECTOMY  2001   for benign ovarian mass   . BIOPSY THYROID    . DENTAL SURGERY     dentures  . NASAL SEPTUM SURGERY    . PARTIAL HYSTERECTOMY  1976   for heavy menses   . RECTAL EXAM UNDER ANESTHESIA N/A 12/14/2015   Procedure: RECTAL EXAM UNDER ANESTHESIA REMOVAL OF ANAL CANAL MASS; INTERNAL HEMORRHOID LIGATION, EXTERNAL HEMORRHOID LIGATION;  Surgeon: Michael Boston, MD;  Location: [facility];  Service: General;  Laterality: N/A;  . TUBAL LIGATION    . WISDOM TOOTH EXTRACTION      Family History  Problem Relation Age of Onset  . Heart disease Mother   . COPD Father   . Lung cancer Maternal Grandfather   . Liver cancer Paternal Grandmother   . Colon cancer Neg Hx   . Colon polyps Neg Hx   . Esophageal cancer Neg Hx   . Rectal cancer Neg Hx   . Stomach cancer Neg Hx     Social History   Socioeconomic History  . Marital status: Widowed    Spouse name: Not on file  . Number of children: Not on file  . Years of education: Not on file  . Highest education level: Not on file  Occupational History  . Not on file  Social Needs  . Financial resource strain: Not on file  . Food insecurity:    Worry: Not on file    Inability: Not on file  . Transportation needs:    Medical: Not on file    Non-medical: Not on file  Tobacco Use  . Smoking status: Light Tobacco Smoker    Packs/day: 0.00    Years: 49.00    Pack years: 0.00    Types: Cigarettes  . Smokeless tobacco: Never Used  . Tobacco comment: 1 cigarette daily-01/03/18  Substance and Sexual Activity  . Alcohol use: No    Alcohol/week: 0.0 oz  . Drug use: No  . Sexual activity: Not on file  Lifestyle  .  Physical activity:    Days per week: Not on file    Minutes per session: Not on file  . Stress: Not on file  Relationships  . Social connections:    Talks on phone: Not on file    Gets together: Not on file    Attends religious service: Not on file    Active member of club or organization: Not on file    Attends meetings  of clubs or organizations: Not on file    Relationship status: Not on file  . Intimate partner violence:    Fear of current or ex partner: Not on file    Emotionally abused: Not on file    Physically abused: Not on file    Forced sexual activity: Not on file  Other Topics Concern  . Not on file  Social History Narrative  . Not on file   Review of systems: Review of Systems  Constitutional: Negative for fever and chills.  HENT: Negative.   Eyes: Negative for blurred vision.  Respiratory: as per HPI  Cardiovascular: Negative for chest pain and palpitations.  Gastrointestinal: Negative for vomiting, diarrhea, blood per rectum. Genitourinary: Negative for dysuria, urgency, frequency and hematuria.  Musculoskeletal: Negative for myalgias, back pain and joint pain.  Skin: Negative for itching and rash.  Neurological: Negative for dizziness, tremors, focal weakness, seizures and loss of consciousness.  Endo/Heme/Allergies: Negative for environmental allergies.  Psychiatric/Behavioral: Negative for depression, suicidal ideas and hallucinations.  All other systems reviewed and are negative.   Physical Exam: Blood pressure 120/70, pulse (!) 104, height 5\' 4"  (1.626 m), weight 107 lb 6.4 oz (48.7 kg), SpO2 94 %. Gen:      No acute distress HEENT:  EOMI, sclera anicteric Neck:     No masses; no thyromegaly Lungs:    Clear to auscultation bilaterally; normal respiratory effort CV:         Regular rate and rhythm; no murmurs Abd:      + bowel sounds; soft, non-tender; no palpable masses, no distension Ext:    No edema; adequate peripheral perfusion Skin:      Warm  and dry; no rash Neuro: alert and oriented x 3 Psych: normal mood and affect  Data Reviewed: Chest x-ray 04/23/17-hyperinflation, no acute abnormality.  CT 09/05/16 Rads2 category. Severe emphysema.  I have reviewed all images personally.  PFTs (07/21/15) FVC 2.06 66%), FEV1 0.93 (39%), F/F 45, DLCO 36% Unable complete pleth. Severe obstructive lung disease, severe diffusion impairment  Spirometry 7/6/1 7 FVC 1.79 [5 7%), FEV1 0.65 (27%), F/F 36 Very severe obstructive airway disease  Assessment:  #1 Severe COPD,Gold Stage D, Chronic bronchitis Continues on Breo and Spiriva. She'll continue flutter valve for clearance of secretions and she does not like Mucinex since she feels it makes the mucous harder to cough.  Resume Daliresp Continue supplemental oxygen Discussed pulmonary rehab but she is not interested.  #2 Current smoker. Smoking cessation was re discussed with her.  She has cut down to 1 cigarette a day and plans on stopping altogether.  Does not want any additional help quitting.  Plan/Recommendations: - Continue spiriva, breo, Daliresp - Continue supplemental oxygen - Flutter valve.  - Smoking cessation.  Marshell Garfinkel MD [facility] Pager [phone] 01/03/2018, 12:22 PM  CC: [name], MD

## 2018-01-03 NOTE — Patient Instructions (Signed)

## 2018-01-03 NOTE — Progress Notes (Signed)
Patient is here for f/up   Patient denies pain for today

## 2018-01-03 NOTE — Patient Instructions (Signed)
Continue your inhalers Restart Daliresp Continue supplemental oxygen Follow-up in 6 months.

## 2018-01-03 NOTE — Telephone Encounter (Signed)
Pt returning call and can be reached @ [phone].Margaret Henry

## 2018-01-06 ENCOUNTER — Ambulatory Visit: Payer: Medicare HMO | Attending: Family Medicine

## 2018-01-06 DIAGNOSIS — Z13228 Encounter for screening for other metabolic disorders: Secondary | ICD-10-CM

## 2018-01-07 ENCOUNTER — Telehealth: Payer: Self-pay | Admitting: Family Medicine

## 2018-01-07 LAB — LIPID PANEL
CHOL/HDL RATIO: 3.1 ratio (ref 0.0–4.4)
Cholesterol, Total: 160 mg/dL (ref 100–199)
HDL: 51 mg/dL (ref >39)
LDL CALC: 77 mg/dL (ref 0–99)
TRIGLYCERIDES: 161 mg/dL — AB (ref 0–149)
VLDL Cholesterol Cal: 32 mg/dL (ref 5–40)

## 2018-01-07 LAB — CMP14+EGFR
A/G RATIO: 2.1 (ref 1.2–2.2)
ALT: 6 IU/L (ref 0–32)
AST: 11 IU/L (ref 0–40)
Albumin: 4.6 g/dL (ref 3.6–4.8)
Alkaline Phosphatase: 73 IU/L (ref 39–117)
BILIRUBIN TOTAL: 0.3 mg/dL (ref 0.0–1.2)
BUN/Creatinine Ratio: 15 (ref 12–28)
BUN: 10 mg/dL (ref 8–27)
CHLORIDE: 105 mmol/L (ref 96–106)
CO2: 23 mmol/L (ref 20–29)
Calcium: 9.7 mg/dL (ref 8.7–10.3)
Creatinine, Ser: 0.65 mg/dL (ref 0.57–1.00)
GFR calc Af Amer: 106 mL/min/{1.73_m2} (ref >59)
GFR calc non Af Amer: 92 mL/min/{1.73_m2} (ref >59)
GLOBULIN, TOTAL: 2.2 g/dL (ref 1.5–4.5)
Glucose: 88 mg/dL (ref 65–99)
POTASSIUM: 4.5 mmol/L (ref 3.5–5.2)
SODIUM: 145 mmol/L — AB (ref 134–144)
Total Protein: 6.8 g/dL (ref 6.0–8.5)

## 2018-01-07 NOTE — Telephone Encounter (Signed)
4 page, paperwork received.

## 2018-01-22 MED FILL — SPIRIVA 18 MCG CP-HANDIHALE: 18 | 30 days supply | Qty: 30 | Fill #2

## 2018-01-22 MED FILL — BREO ELLIPTA 100-25 MCG INH: 100-25 | 30 days supply | Qty: 60 | Fill #0

## 2018-01-22 MED FILL — ALBUTEROL 0.083% INHAL SOLN: (2.5 MG/3ML | 6 days supply | Qty: 75 | Fill #2

## 2018-02-13 MED FILL — FLUTICASONE PROP 50 MCG SPR: 50 | 30 days supply | Qty: 16 | Fill #2

## 2018-02-13 MED FILL — VENTOLIN HFA 90 MCG INHALER: 108 (90 BAS | 17 days supply | Qty: 18 | Fill #0

## 2018-02-13 MED FILL — ALBUTEROL 0.083% INHAL SOLN: (2.5 MG/3ML | 6 days supply | Qty: 75 | Fill #0

## 2018-02-18 ENCOUNTER — Encounter: Payer: Self-pay | Admitting: Pulmonary Disease

## 2018-02-24 ENCOUNTER — Other Ambulatory Visit: Payer: Self-pay | Admitting: Pulmonary Disease

## 2018-02-24 MED FILL — ASPIRIN ADULT LOW STRENGTH: 81 | 90 days supply | Qty: 90 | Fill #2

## 2018-02-24 MED FILL — SPIRIVA 18 MCG CP-HANDIHALE: 18 | 30 days supply | Qty: 30 | Fill #0

## 2018-02-24 MED FILL — LORATADINE 10 MG TABLET: 10 | 100 days supply | Qty: 100 | Fill #0

## 2018-02-25 MED FILL — BREO ELLIPTA 100-25 MCG INH: 100-25 | 30 days supply | Qty: 60 | Fill #1

## 2018-03-07 ENCOUNTER — Ambulatory Visit (HOSPITAL_BASED_OUTPATIENT_CLINIC_OR_DEPARTMENT_OTHER)
Admission: RE | Admit: 2018-03-07 | Discharge: 2018-03-07 | Disposition: A | Discharge status: Home / Self Care | Payer: Medicare HMO | Source: Ambulatory Visit | Attending: Acute Care | Admitting: Acute Care

## 2018-03-07 DIAGNOSIS — Z122 Encounter for screening for malignant neoplasm of respiratory organs: Secondary | ICD-10-CM | POA: Diagnosis present

## 2018-03-07 DIAGNOSIS — I700 Atherosclerosis of aorta: Secondary | ICD-10-CM | POA: Insufficient documentation

## 2018-03-07 DIAGNOSIS — K8020 Calculus of gallbladder without cholecystitis without obstruction: Secondary | ICD-10-CM | POA: Diagnosis not present

## 2018-03-07 DIAGNOSIS — J439 Emphysema, unspecified: Secondary | ICD-10-CM | POA: Insufficient documentation

## 2018-03-07 DIAGNOSIS — I2510 Atherosclerotic heart disease of native coronary artery without angina pectoris: Secondary | ICD-10-CM | POA: Diagnosis not present

## 2018-03-07 DIAGNOSIS — F17210 Nicotine dependence, cigarettes, uncomplicated: Secondary | ICD-10-CM | POA: Insufficient documentation

## 2018-03-13 MED FILL — raNITIdine HCL 300 MG TABS: 300 | 60 days supply | Qty: 60 | Fill #1

## 2018-03-13 MED FILL — ALBUTEROL 0.083% INHAL SOLN: (2.5 MG/3ML | 6 days supply | Qty: 75 | Fill #1

## 2018-03-13 MED FILL — FLUTICASONE PROP 50 MCG SPR: 50 | 30 days supply | Qty: 16 | Fill #3

## 2018-03-13 MED FILL — VENTOLIN HFA 90 MCG INHALER: 108 (90 BAS | 17 days supply | Qty: 18 | Fill #1

## 2018-03-17 ENCOUNTER — Encounter: Payer: Self-pay | Admitting: Family Medicine

## 2018-03-17 ENCOUNTER — Ambulatory Visit: Payer: Medicare HMO | Attending: Family Medicine | Admitting: Family Medicine

## 2018-03-17 ENCOUNTER — Telehealth: Payer: Self-pay

## 2018-03-17 VITALS — BP 129/69 | HR 108 | Temp 97.5°F | Ht 64.0 in | Wt 105.0 lb

## 2018-03-17 DIAGNOSIS — J9611 Chronic respiratory failure with hypoxia: Secondary | ICD-10-CM | POA: Diagnosis not present

## 2018-03-17 DIAGNOSIS — E785 Hyperlipidemia, unspecified: Secondary | ICD-10-CM

## 2018-03-17 DIAGNOSIS — R0602 Shortness of breath: Secondary | ICD-10-CM | POA: Diagnosis present

## 2018-03-17 DIAGNOSIS — J40 Bronchitis, not specified as acute or chronic: Secondary | ICD-10-CM

## 2018-03-17 DIAGNOSIS — K8020 Calculus of gallbladder without cholecystitis without obstruction: Secondary | ICD-10-CM | POA: Insufficient documentation

## 2018-03-17 DIAGNOSIS — Z79899 Other long term (current) drug therapy: Secondary | ICD-10-CM | POA: Diagnosis not present

## 2018-03-17 DIAGNOSIS — N3281 Overactive bladder: Secondary | ICD-10-CM | POA: Diagnosis not present

## 2018-03-17 DIAGNOSIS — Z85828 Personal history of other malignant neoplasm of skin: Secondary | ICD-10-CM | POA: Diagnosis not present

## 2018-03-17 DIAGNOSIS — E7800 Pure hypercholesterolemia, unspecified: Secondary | ICD-10-CM | POA: Diagnosis not present

## 2018-03-17 DIAGNOSIS — J42 Unspecified chronic bronchitis: Secondary | ICD-10-CM | POA: Diagnosis not present

## 2018-03-17 DIAGNOSIS — Z7982 Long term (current) use of aspirin: Secondary | ICD-10-CM | POA: Diagnosis not present

## 2018-03-17 DIAGNOSIS — F17210 Nicotine dependence, cigarettes, uncomplicated: Secondary | ICD-10-CM | POA: Insufficient documentation

## 2018-03-17 DIAGNOSIS — Z885 Allergy status to narcotic agent status: Secondary | ICD-10-CM | POA: Insufficient documentation

## 2018-03-17 DIAGNOSIS — Z86010 Personal history of colonic polyps: Secondary | ICD-10-CM | POA: Diagnosis not present

## 2018-03-17 DIAGNOSIS — K219 Gastro-esophageal reflux disease without esophagitis: Secondary | ICD-10-CM | POA: Insufficient documentation

## 2018-03-17 DIAGNOSIS — I2510 Atherosclerotic heart disease of native coronary artery without angina pectoris: Secondary | ICD-10-CM | POA: Insufficient documentation

## 2018-03-17 DIAGNOSIS — J3089 Other allergic rhinitis: Secondary | ICD-10-CM

## 2018-03-17 DIAGNOSIS — J449 Chronic obstructive pulmonary disease, unspecified: Secondary | ICD-10-CM | POA: Diagnosis present

## 2018-03-17 MED ORDER — FLUTICASONE PROPIONATE 50 MCG/ACT NA SUSP: 2.0000 | Freq: Every day | NASAL | 5 refills | Status: DC

## 2018-03-17 MED ORDER — RANITIDINE HCL 300 MG PO TABS: 300.0000 mg | ORAL_TABLET | Freq: Every day | ORAL | 3 refills | Status: DC

## 2018-03-17 MED ORDER — SPIRIVA HANDIHALER 18 MCG IN CAPS: ORAL_CAPSULE | RESPIRATORY_TRACT | 3 refills | Status: DC

## 2018-03-17 MED ORDER — AZITHROMYCIN 250 MG PO TABS: ORAL_TABLET | ORAL | 0 refills | Status: DC

## 2018-03-17 MED ORDER — OXYBUTYNIN CHLORIDE 5 MG PO TABS: 5.0000 mg | ORAL_TABLET | Freq: Two times a day (BID) | ORAL | 3 refills | Status: DC

## 2018-03-17 MED ORDER — FLUTICASONE FUROATE-VILANTEROL 100-25 MCG/INH IN AEPB: INHALATION_SPRAY | RESPIRATORY_TRACT | 3 refills | Status: DC

## 2018-03-17 MED ORDER — ALBUTEROL SULFATE HFA 108 (90 BASE) MCG/ACT IN AERS: INHALATION_SPRAY | RESPIRATORY_TRACT | 2 refills | Status: DC

## 2018-03-17 MED ORDER — BENZONATATE 100 MG PO CAPS: 100.0000 mg | ORAL_CAPSULE | Freq: Two times a day (BID) | ORAL | 0 refills | Status: DC | PRN

## 2018-03-17 MED ORDER — ATORVASTATIN CALCIUM 40 MG PO TABS: 40.0000 mg | ORAL_TABLET | Freq: Every day | ORAL | 3 refills | Status: DC

## 2018-03-17 MED ORDER — ALBUTEROL SULFATE (2.5 MG/3ML) 0.083% IN NEBU: 2.5000 mg | INHALATION_SOLUTION | Freq: Four times a day (QID) | RESPIRATORY_TRACT | 2 refills | Status: DC | PRN

## 2018-03-17 MED FILL — BENZONATATE 100 MG CAPSULE: 100 | 15 days supply | Qty: 30 | Fill #0

## 2018-03-17 MED FILL — AZITHROMYCIN 250 MG TABLET: 250 | 5 days supply | Qty: 6 | Fill #0

## 2018-03-17 MED FILL — OXYBUTYNIN 5 MG TABLET: 5 | 30 days supply | Qty: 60 | Fill #0

## 2018-03-17 MED FILL — ATORVASTATIN 40 MG TABLET: 40 | 30 days supply | Qty: 30 | Fill #0

## 2018-03-17 NOTE — Telephone Encounter (Signed)
Met with the patient at the request of Dr Margarita Rana.  She was in the clinic for her appointment and was interested in a portable O2 concentrator.   The patient explained that she contacted [employer] and was told that her insurance would not cover the unit.  Informed her that her insurance may not cover that unit but it may cover another brand. She has [employer] and medicaid.  The patient also said that she contacted [facility], her current O2 provider, and was told that they do not have the portable concentrators. Informed her that this CM could check with [facility] and [facility] as they have been carrying the portable concentrators.  The patient stated that her biggest concern is the weight of the unit. She was not interested in the concentrators that are pulled on wheels like a suitcase she she would need to pick it up and put it in a car and it would be too taxing for her. This CM explained that the portable concentrators that you carry on a shoulder are not as light as they may seem.  Informed her that this CM would check with [facility] to inquire more about the weight of the unit and then inform her of the outcome of the call. She was very appreciative but again noted that she wants to make sure that it is lighter than the portable mini tank that she currently has. This CM also explained that if she decides she wants the portable concentrator, she will need to have all of her O2 equipment switched to [place]  Call placed to [facility]# [phone]. Spoke to [place] to inquire about the portable O2 concentrators.   She said that the [facility] care unit that they provide weighs about 5 lbs. She explained that an order for O2 concentrator with portable O2 concentrator (POC) with titration can be faxed to # [phone] along with demographic information and clinic note with result of O2 testing. She stated that they will then send a respiratory therapist to the home to assess the patient's tolerance of the unit and the pulse  dose/conserve dose. .  They can get additional documentation from the current O2 provider. The patient can notify the current provider to remove the equipment when the new equipment is in place.   Call placed to the patient to inform her of the conversation with [facility] and she said that she would like to keep what she has for now as the current O2 cylinder probably does not weigh 5 lbs. Informed her that she can contact this office if she changes her mind.  Update provided to Dr Margarita Rana.

## 2018-03-17 NOTE — Progress Notes (Signed)
Subjective:  Patient ID: Margaret Henry, female    DOB: May 08, 1951  Age: 67 y.o. MRN: [id number]  CC: COPD and Shortness of Breath   HPI Margaret Henry is a 67 year old woman with a history of COPD, tobacco abuse, GERD, hyperlipidemia, chronic respiratory failure (of 2 L of oxygen at rest) who presents today for a follow-up visit.She has cut back on her smoking and smokes 1 cigarette/day with her morning coffee. Doing well on oxygen and does not need it when she is sitting at rest but on mild exertion requires it. 5 days ago she was caught in the rain and developed a cough with production of yellowish sputum, associated dyspnea ever since and her oxygen saturation in the clinic is 96% on 2 L of oxygen.  Symptoms have not improved despite using Proventil inhaler, nebulizer treatment, Breo.  She denies fever, sinus congestion. Last visit to pulmonary was in 12/2017 with no change in her regimen.  CT for follow-up of left lung nodule was performed on 03/07/2018 with findings below: IMPRESSION: 1. Lung-RADS 2, benign appearance or behavior. Continue annual screening with low-dose chest CT without contrast in 12 months. 2. 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 3. Cholelithiasis.  She also complains of urinary accidents whenever she is short of breath and this occurs when she has been out of her home and is returning home.  She denies stress incontinence and states symptoms did not occur while she is in the house. Reflux symptoms are controlled on ranitidine and she is tolerating her statin with no complaints of myalgia.  Past Medical History:  Diagnosis Date  . Allergy    hayfever  . Bronchitis   . Cancer ([employer])    skin cancer on chest  . Colon polyps   . Complication of anesthesia    pt states was given too much during nasal surgery 1989; difficulty getting awake  . COPD (chronic obstructive pulmonary disease) ([place])   . Diverticulitis   . GERD (gastroesophageal reflux disease)      occasional  . Hemorrhoids   . High cholesterol   . Osteoporosis   . Pneumonia   . Shortness of breath dyspnea    with exertion   . Thyroid goiter    bx benign  . Vertigo     Past Surgical History:  Procedure Laterality Date  . ABDOMINAL HYSTERECTOMY    . BILATERAL OOPHORECTOMY  2001   for benign ovarian mass   . BIOPSY THYROID    . DENTAL SURGERY     dentures  . NASAL SEPTUM SURGERY    . PARTIAL HYSTERECTOMY  1976   for heavy menses   . RECTAL EXAM UNDER ANESTHESIA N/A 12/14/2015   Procedure: RECTAL EXAM UNDER ANESTHESIA REMOVAL OF ANAL CANAL MASS; INTERNAL HEMORRHOID LIGATION, EXTERNAL HEMORRHOID LIGATION;  Surgeon: Michael Boston, MD;  Location: [facility];  Service: General;  Laterality: N/A;  . TUBAL LIGATION    . WISDOM TOOTH EXTRACTION      Allergies  Allergen Reactions  . Hydrocodone Nausea And Vomiting  . Propoxyphene N-Acetaminophen Nausea And Vomiting     Outpatient Medications Prior to Visit  Medication Sig Dispense Refill  . acetaminophen (TYLENOL) 500 MG tablet Take 1,500 mg by mouth every 4 (four) hours as needed. For headache    . aspirin EC 81 MG tablet Take 1 tablet (81 mg total) by mouth daily. 90 tablet 3  . loratadine (CLARITIN) 10 MG tablet TAKE 1 TABLET (10 MG  TOTAL) BY MOUTH DAILY. 100 tablet 2  . meclizine (ANTIVERT) 25 MG tablet Take 1 tablet (25 mg total) by mouth 3 (three) times daily as needed for dizziness. 20 tablet 0  . Omega-3 Fatty Acids (FISH OIL) 1000 MG CAPS Take 1 capsule (1,000 mg total) by mouth daily. 30 capsule 6  . Oxymetazoline HCl (SINEX LONG-ACTING NA) Place 2 sprays into the nose daily. Reported on 01/19/2016    . Respiratory Therapy Supplies (FLUTTER) DEVI USE AS DIRECTED 1 each 0  . roflumilast (DALIRESP) 500 MCG TABS tablet Take 1 tablet (500 mcg total) by mouth daily. 30 tablet 11  . VENTOLIN HFA 108 (90 Base) MCG/ACT inhaler INHALE 2 PUFFS BY MOUTH INTO THE LUNGS EVERY 4 (FOUR) HOURS AS NEEDED FOR WHEEZING OR SHORTNESS OF  BREATH. 1 Inhaler 1  . albuterol (PROVENTIL) (2.5 MG/3ML) 0.083% nebulizer solution Take 3 mLs (2.5 mg total) by nebulization every 6 (six) hours as needed for wheezing or shortness of breath. 75 mL 2  . albuterol (VENTOLIN HFA) 108 (90 Base) MCG/ACT inhaler INHALE 2 PUFFS BY MOUTH INTO THE LUNGS EVERY 4 (FOUR) HOURS AS NEEDED FOR WHEEZING OR SHORTNESS OF BREATH. 18 g 2  . atorvastatin (LIPITOR) 40 MG tablet Take 1 tablet (40 mg total) by mouth daily. 90 tablet 3  . benzonatate (TESSALON) 100 MG capsule Take 1 capsule (100 mg total) by mouth 2 (two) times daily as needed for cough. 40 capsule 1  . fluticasone (FLONASE) 50 MCG/ACT nasal spray Place 2 sprays into both nostrils daily. 16 g 5  . fluticasone furoate-vilanterol (BREO ELLIPTA) 100-25 MCG/INH AEPB INHALE 1 PUFF BY MOUTH INTO THE LUNGS DAILY. 60 each 2  . ranitidine (ZANTAC) 300 MG tablet Take 1 tablet (300 mg total) by mouth daily after supper. (30 mins after supper) 60 tablet 3  . SPIRIVA HANDIHALER 18 MCG inhalation capsule PLACE 1 CAPSULE (18 MCG TOTAL) INTO INHALER AND INHALE BY MOUTH DAILY. 30 capsule 2   No facility-administered medications prior to visit.     ROS Review of Systems  Constitutional: Negative for activity change, appetite change and fatigue.  HENT: Negative for congestion, sinus pressure and sore throat.   Eyes: Negative for visual disturbance.  Respiratory: Positive for cough and shortness of breath. Negative for chest tightness and wheezing.   Cardiovascular: Negative for chest pain and palpitations.  Gastrointestinal: Negative for abdominal distention, abdominal pain and constipation.  Endocrine: Negative for polydipsia.  Genitourinary: Negative for dysuria and frequency.  Musculoskeletal: Negative for arthralgias and back pain.  Skin: Negative for rash.  Neurological: Negative for tremors, light-headedness and numbness.  Hematological: Does not bruise/bleed easily.  Psychiatric/Behavioral: Negative for  agitation and behavioral problems.    Objective:  BP 129/69   Pulse (!) 108   Temp (!) 97.5 F (36.4 C) (Oral)   Ht 5\' 4"  (1.626 m)   Wt 105 lb (47.6 kg)   SpO2 96%   BMI 18.02 kg/m   BP/Weight 03/17/2018 01/03/2018 0/25/8527  Systolic BP 782 423 536  Diastolic BP 69 70 77  Wt. (Lbs) 105 107.4 107.6  BMI 18.02 18.44 18.47      Physical Exam  Constitutional: She is oriented to person, place, and time. She appears well-developed and well-nourished.  Cardiovascular: Normal rate, normal heart sounds and intact distal pulses.  No murmur heard. Pulmonary/Chest: Effort normal. She has no wheezes. She has no rales. She exhibits no tenderness.  Short expiratory phase of respiration, normal inspiratory phase  Abdominal: Soft.  Bowel sounds are normal. She exhibits no distension and no mass. There is no tenderness.  Musculoskeletal: Normal range of motion.  Neurological: She is alert and oriented to person, place, and time.  Skin: Skin is warm and dry.  Psychiatric: She has a normal mood and affect. Her behavior is normal.     CMP Latest Ref Rng & Units 01/06/2018 04/18/2017 09/05/2016  Glucose 65 - 99 mg/dL 88 89 78  BUN 8 - 27 mg/dL 10 13 11   Creatinine 0.57 - 1.00 mg/dL 0.65 0.59 0.60  Sodium 134 - 144 mmol/L 145(H) 145(H) 142  Potassium 3.5 - 5.2 mmol/L 4.5 4.7 4.0  Chloride 96 - 106 mmol/L 105 105 106  CO2 20 - 29 mmol/L 23 24 27   Calcium 8.7 - 10.3 mg/dL 9.7 9.7 9.6  Total Protein 6.0 - 8.5 g/dL 6.8 7.3 7.3  Total Bilirubin 0.0 - 1.2 mg/dL 0.3 0.4 0.4  Alkaline Phos 39 - 117 IU/L 73 76 68  AST 0 - 40 IU/L 11 14 15   ALT 0 - 32 IU/L 6 10 11     Lipid Panel     Component Value Date/Time   CHOL 160 01/06/2018 1058   TRIG 161 (H) 01/06/2018 1058   HDL 51 01/06/2018 1058   CHOLHDL 3.1 01/06/2018 1058   CHOLHDL 3.1 09/05/2016 1218   VLDL 39 (H) 09/05/2016 1218   LDLCALC 77 01/06/2018 1058   LDLDIRECT 166 (H) 09/26/2015 1001     Assessment & Plan:   1. Chronic  bronchitis, unspecified chronic bronchitis type ([place]) Acute exacerbation Commenced on antibiotics Advised to smoking cessation will help retard progression of condition Continue Daliresp Followed by pulmonary - albuterol (VENTOLIN HFA) 108 (90 Base) MCG/ACT inhaler; INHALE 2 PUFFS BY MOUTH INTO THE LUNGS EVERY 4 (FOUR) HOURS AS NEEDED FOR WHEEZING OR SHORTNESS OF BREATH.  Dispense: 18 g; Refill: 2 - albuterol (PROVENTIL) (2.5 MG/3ML) 0.083% nebulizer solution; Take 3 mLs (2.5 mg total) by nebulization every 6 (six) hours as needed for wheezing or shortness of breath.  Dispense: 75 mL; Refill: 2 - benzonatate (TESSALON) 100 MG capsule; Take 1 capsule (100 mg total) by mouth 2 (two) times daily as needed for cough.  Dispense: 30 capsule; Refill: 0 - fluticasone furoate-vilanterol (BREO ELLIPTA) 100-25 MCG/INH AEPB; INHALE 1 PUFF BY MOUTH INTO THE LUNGS DAILY.  Dispense: 30 each; Refill: 3 - SPIRIVA HANDIHALER 18 MCG inhalation capsule; PLACE 1 CAPSULE (18 MCG TOTAL) INTO INHALER AND INHALE BY MOUTH DAILY.  Dispense: 30 capsule; Refill: 3  2. Coronary arteriosclerosis Controlled Low-cholesterol diet - atorvastatin (LIPITOR) 40 MG tablet; Take 1 tablet (40 mg total) by mouth daily.  Dispense: 30 tablet; Refill: 3  3. Hyperlipidemia, unspecified hyperlipidemia type Controlled Low-cholesterol diet - atorvastatin (LIPITOR) 40 MG tablet; Take 1 tablet (40 mg total) by mouth daily.  Dispense: 30 tablet; Refill: 3  4. Non-seasonal allergic rhinitis due to other allergic trigger Stable - fluticasone (FLONASE) 50 MCG/ACT nasal spray; Place 2 sprays into both nostrils daily.  Dispense: 16 g; Refill: 5  5. Gastroesophageal reflux disease, esophagitis presence not specified Controlled - ranitidine (ZANTAC) 300 MG tablet; Take 1 tablet (300 mg total) by mouth daily after supper. (30 mins after supper)  Dispense: 60 tablet; Refill: 3  6. Overactive bladder Uncontrolled Discussed Kegel exercises -  oxybutynin (DITROPAN) 5 MG tablet; Take 1 tablet (5 mg total) by mouth 2 (two) times daily.  Dispense: 60 tablet; Refill: 3  7. Bronchitis - azithromycin (ZITHROMAX) 250 MG  tablet; Tabs (500 mg) on day 1 then 1 tab (250 mg) on days 2-5  Dispense: 6 tablet; Refill: 0  8. Chronic respiratory failure with hypoxia (HCC) Continue 2 L of oxygen at night I have spoken with the case manager regarding obtaining a portable oxygen concentrator for her.   Meds ordered this encounter  Medications  . albuterol (VENTOLIN HFA) 108 (90 Base) MCG/ACT inhaler    Sig: INHALE 2 PUFFS BY MOUTH INTO THE LUNGS EVERY 4 (FOUR) HOURS AS NEEDED FOR WHEEZING OR SHORTNESS OF BREATH.    Dispense:  18 g    Refill:  2  . albuterol (PROVENTIL) (2.5 MG/3ML) 0.083% nebulizer solution    Sig: Take 3 mLs (2.5 mg total) by nebulization every 6 (six) hours as needed for wheezing or shortness of breath.    Dispense:  75 mL    Refill:  2  . atorvastatin (LIPITOR) 40 MG tablet    Sig: Take 1 tablet (40 mg total) by mouth daily.    Dispense:  30 tablet    Refill:  3  . benzonatate (TESSALON) 100 MG capsule    Sig: Take 1 capsule (100 mg total) by mouth 2 (two) times daily as needed for cough.    Dispense:  30 capsule    Refill:  0  . fluticasone (FLONASE) 50 MCG/ACT nasal spray    Sig: Place 2 sprays into both nostrils daily.    Dispense:  16 g    Refill:  5  . fluticasone furoate-vilanterol (BREO ELLIPTA) 100-25 MCG/INH AEPB    Sig: INHALE 1 PUFF BY MOUTH INTO THE LUNGS DAILY.    Dispense:  30 each    Refill:  3  . ranitidine (ZANTAC) 300 MG tablet    Sig: Take 1 tablet (300 mg total) by mouth daily after supper. (30 mins after supper)    Dispense:  60 tablet    Refill:  3  . SPIRIVA HANDIHALER 18 MCG inhalation capsule    Sig: PLACE 1 CAPSULE (18 MCG TOTAL) INTO INHALER AND INHALE BY MOUTH DAILY.    Dispense:  30 capsule    Refill:  3  . azithromycin (ZITHROMAX) 250 MG tablet    Sig: Tabs (500 mg) on day 1 then 1  tab (250 mg) on days 2-5    Dispense:  6 tablet    Refill:  0  . oxybutynin (DITROPAN) 5 MG tablet    Sig: Take 1 tablet (5 mg total) by mouth 2 (two) times daily.    Dispense:  60 tablet    Refill:  3    Follow-up: Return in about 3 months (around 06/17/2018) for Follow-up of chronic medical conditions.   [name] MD

## 2018-03-17 NOTE — Patient Instructions (Signed)

## 2018-03-19 ENCOUNTER — Other Ambulatory Visit: Payer: Self-pay | Admitting: Acute Care

## 2018-03-19 DIAGNOSIS — F17210 Nicotine dependence, cigarettes, uncomplicated: Secondary | ICD-10-CM

## 2018-03-19 DIAGNOSIS — Z122 Encounter for screening for malignant neoplasm of respiratory organs: Secondary | ICD-10-CM

## 2018-03-20 ENCOUNTER — Telehealth: Payer: Self-pay

## 2018-03-20 NOTE — Telephone Encounter (Signed)
Call received from the patient stating that she spoke to [employer] about the portable O2 concentrator and they are faxing a prescription to this office for the provider to sign. She requested that this order be faxed back to [employer] when received as they will need to obtain prior auth from [facility].

## 2018-03-21 NOTE — Telephone Encounter (Signed)
Fax has been received and put in PCP folder to sign off.

## 2018-03-24 ENCOUNTER — Telehealth: Payer: Self-pay

## 2018-03-24 MED FILL — BREO ELLIPTA 100-25 MCG INH: 100-25 | 30 days supply | Qty: 60 | Fill #2

## 2018-03-24 MED FILL — ALBUTEROL 0.083% INHAL SOLN: (2.5 MG/3ML | 6 days supply | Qty: 75 | Fill #2

## 2018-03-24 MED FILL — SPIRIVA 18 MCG CP-HANDIHALE: 18 | 30 days supply | Qty: 30 | Fill #1

## 2018-03-24 NOTE — Telephone Encounter (Signed)
Call placed to [employer] - # [phone], spoke ot [employer]. He explained that no additional information is needed at this time other than the fax request for an order that was sent to Dr Margarita Rana.  He stated that [employer] does not work with [employer] so the patient would need to pay privately for the unit ( $[phone]).  He noted that they also do not work with [place] [employer].  He said that he has spoken to the patient a number of times and explained tha this would be a private pay transaction and she is still considering her options regarding a portable O2 concentrator.  He stated that she would keep the current O2 equipment that is in her home.

## 2018-04-22 MED FILL — ALBUTEROL 0.083% INHAL SOLN: (2.5 MG/3ML | 7 days supply | Qty: 75 | Fill #0

## 2018-04-22 MED FILL — BREO ELLIPTA 100-25 MCG INH: 100-25 | 30 days supply | Qty: 60 | Fill #0

## 2018-04-22 MED FILL — LORATADINE 10 MG TABLET: 10 | 100 days supply | Qty: 100 | Fill #1

## 2018-04-22 MED FILL — FLUTICASONE PROP 50 MCG SPR: 50 | 30 days supply | Qty: 16 | Fill #0

## 2018-04-22 MED FILL — VENTOLIN HFA 90 MCG INHALER: 108 (90 BAS | 17 days supply | Qty: 18 | Fill #0

## 2018-04-22 MED FILL — SPIRIVA 18 MCG CP-HANDIHALE: 18 | 30 days supply | Qty: 30 | Fill #2

## 2018-05-07 ENCOUNTER — Other Ambulatory Visit: Payer: Self-pay

## 2018-05-07 ENCOUNTER — Encounter (HOSPITAL_BASED_OUTPATIENT_CLINIC_OR_DEPARTMENT_OTHER): Payer: Self-pay | Admitting: Emergency Medicine

## 2018-05-07 ENCOUNTER — Encounter

## 2018-05-07 ENCOUNTER — Emergency Department (HOSPITAL_BASED_OUTPATIENT_CLINIC_OR_DEPARTMENT_OTHER)
Admission: EM | Admit: 2018-05-07 | Discharge: 2018-05-07 | Disposition: A | Discharge status: Home / Self Care | Payer: Medicare HMO | Attending: Emergency Medicine | Admitting: Emergency Medicine

## 2018-05-07 DIAGNOSIS — M79645 Pain in left finger(s): Secondary | ICD-10-CM | POA: Diagnosis present

## 2018-05-07 DIAGNOSIS — I2510 Atherosclerotic heart disease of native coronary artery without angina pectoris: Secondary | ICD-10-CM | POA: Diagnosis not present

## 2018-05-07 DIAGNOSIS — Z85828 Personal history of other malignant neoplasm of skin: Secondary | ICD-10-CM | POA: Insufficient documentation

## 2018-05-07 DIAGNOSIS — B351 Tinea unguium: Secondary | ICD-10-CM | POA: Insufficient documentation

## 2018-05-07 DIAGNOSIS — Z79899 Other long term (current) drug therapy: Secondary | ICD-10-CM | POA: Diagnosis not present

## 2018-05-07 DIAGNOSIS — F17210 Nicotine dependence, cigarettes, uncomplicated: Secondary | ICD-10-CM | POA: Insufficient documentation

## 2018-05-07 DIAGNOSIS — J449 Chronic obstructive pulmonary disease, unspecified: Secondary | ICD-10-CM | POA: Insufficient documentation

## 2018-05-07 DIAGNOSIS — Z7982 Long term (current) use of aspirin: Secondary | ICD-10-CM | POA: Diagnosis not present

## 2018-05-07 DIAGNOSIS — F329 Major depressive disorder, single episode, unspecified: Secondary | ICD-10-CM | POA: Diagnosis not present

## 2018-05-07 MED ORDER — CICLOPIROX 8 % EX SOLN: Freq: Every day | CUTANEOUS | 0 refills | Status: DC

## 2018-05-07 MED FILL — CICLOPIROX 8% SOLUTION: 8 | 30 days supply | Qty: 7 | Fill #0

## 2018-05-07 NOTE — ED Triage Notes (Signed)
Ecchymosis noted to left middle finger.  Denies injury.  Reports soaking in epsom salt without relief.

## 2018-05-07 NOTE — Discharge Instructions (Signed)
If your pain or the abnormal color spreads or worsens or you develop fever or redness to the finger or hand or any other new/concerning symptoms and return to the ER for evaluation.  Otherwise follow-up with your primary care doctor for a wound check.

## 2018-05-07 NOTE — ED Notes (Signed)
ED Provider at bedside. 

## 2018-05-07 NOTE — ED Provider Notes (Signed)
[facility] EMERGENCY DEPARTMENT Provider Note   CSN: [id number] Arrival date & time: 05/07/18  1041     History   Chief Complaint Chief Complaint  Patient presents with  . Nail Problem    HPI Margaret Henry is a 67 y.o. female.  HPI 67 year old female presents with a left middle finger nail infection.  Has been ongoing for about a week.  Has noticed some green discoloration beneath her proximal nail.  The nail feels like its loosening.  However there is no pain to the finger or redness.  Has tried some Neosporin without relief.  Today the pain seemed to be a little bit worse and the green seem to be more.  Past Medical History:  Diagnosis Date  . Allergy    hayfever  . Bronchitis   . Cancer ([place])    skin cancer on chest  . Colon polyps   . Complication of anesthesia    pt states was given too much during nasal surgery 1989; difficulty getting awake  . COPD (chronic obstructive pulmonary disease) ([place])   . Diverticulitis   . GERD (gastroesophageal reflux disease)    occasional  . Hemorrhoids   . High cholesterol   . Osteoporosis   . Pneumonia   . Shortness of breath dyspnea    with exertion   . Thyroid goiter    bx benign  . Vertigo     Patient Active Problem List   Diagnosis Date Noted  . Overactive bladder 03/17/2018  . Chronic respiratory failure ([place]) 10/04/2017  . Vertigo 04/21/2017  . Dyspnea 06/27/2016  . Seborrheic keratoses 06/12/2016  . Chest pain 06/12/2016  . Coronary arteriosclerosis 09/26/2015  . COPD exacerbation ([place]) 06/01/2013  . Unspecified vitamin D deficiency 03/02/2010  . Allergic rhinitis 03/02/2010  . BACK PAIN, LUMBAR 03/02/2010  . TROCHANTERIC BURSITIS, BILATERAL 03/02/2010  . THYROID NODULE, RIGHT 11/09/2009  . DEPRESSION 11/09/2009  . Hyperlipidemia 02/25/2009  . GERD 02/25/2009  . OSTEOPOROSIS 01/30/2009  . COMPRESSION FRACTURE, SPINE 01/21/2009  . TOBACCO ABUSE 09/07/2008  . COPD (chronic obstructive pulmonary  disease) ([place]) 09/07/2008  . Goiter, unspecified 10/08/1997    Past Surgical History:  Procedure Laterality Date  . ABDOMINAL HYSTERECTOMY    . BILATERAL OOPHORECTOMY  2001   for benign ovarian mass   . BIOPSY THYROID    . DENTAL SURGERY     dentures  . NASAL SEPTUM SURGERY    . PARTIAL HYSTERECTOMY  1976   for heavy menses   . RECTAL EXAM UNDER ANESTHESIA N/A 12/14/2015   Procedure: RECTAL EXAM UNDER ANESTHESIA REMOVAL OF ANAL CANAL MASS; INTERNAL HEMORRHOID LIGATION, EXTERNAL HEMORRHOID LIGATION;  Surgeon: Michael Boston, MD;  Location: [facility];  Service: General;  Laterality: N/A;  . TUBAL LIGATION    . WISDOM TOOTH EXTRACTION       OB History   None      Home Medications    Prior to Admission medications   Medication Sig Start Date End Date Taking? Authorizing Provider  acetaminophen (TYLENOL) 500 MG tablet Take 1,500 mg by mouth every 4 (four) hours as needed. For headache    [provider]  albuterol (PROVENTIL) (2.5 MG/3ML) 0.083% nebulizer solution Take 3 mLs (2.5 mg total) by nebulization every 6 (six) hours as needed for wheezing or shortness of breath. 03/17/18   [name], MD  albuterol (VENTOLIN HFA) 108 (90 Base) MCG/ACT inhaler INHALE 2 PUFFS BY MOUTH INTO THE LUNGS EVERY 4 (FOUR) HOURS AS NEEDED  FOR WHEEZING OR SHORTNESS OF BREATH. 03/17/18   [name], MD  aspirin EC 81 MG tablet Take 1 tablet (81 mg total) by mouth daily. 04/21/17   Funches, Adriana Mccallum, MD  atorvastatin (LIPITOR) 40 MG tablet Take 1 tablet (40 mg total) by mouth daily. 03/17/18   [name], MD  azithromycin (ZITHROMAX) 250 MG tablet Tabs (500 mg) on day 1 then 1 tab (250 mg) on days 2-5 03/17/18   [name], MD  benzonatate (TESSALON) 100 MG capsule Take 1 capsule (100 mg total) by mouth 2 (two) times daily as needed for cough. 03/17/18   [name], MD  ciclopirox (PENLAC) 8 % solution Apply topically at bedtime. 05/07/18   Sherwood Gambler, MD  fluticasone (FLONASE) 50  MCG/ACT nasal spray Place 2 sprays into both nostrils daily. 03/17/18   [name], MD  fluticasone furoate-vilanterol (BREO ELLIPTA) 100-25 MCG/INH AEPB INHALE 1 PUFF BY MOUTH INTO THE LUNGS DAILY. 03/17/18   [name], MD  loratadine (CLARITIN) 10 MG tablet TAKE 1 TABLET (10 MG TOTAL) BY MOUTH DAILY. 02/24/18   Mannam, Hart Robinsons, MD  meclizine (ANTIVERT) 25 MG tablet Take 1 tablet (25 mg total) by mouth 3 (three) times daily as needed for dizziness. 04/18/17   Funches, Adriana Mccallum, MD  Omega-3 Fatty Acids (FISH OIL) 1000 MG CAPS Take 1 capsule (1,000 mg total) by mouth daily. 09/06/16   Dorothy Spark, MD  oxybutynin (DITROPAN) 5 MG tablet Take 1 tablet (5 mg total) by mouth 2 (two) times daily. 03/17/18   [name], MD  Oxymetazoline HCl (SINEX LONG-ACTING NA) Place 2 sprays into the nose daily. Reported on 01/19/2016    [provider]  ranitidine (ZANTAC) 300 MG tablet Take 1 tablet (300 mg total) by mouth daily after supper. (30 mins after supper) 03/17/18   [name], MD  Respiratory Therapy Supplies (FLUTTER) DEVI USE AS DIRECTED 03/11/17   Mannam, Hart Robinsons, MD  roflumilast (DALIRESP) 500 MCG TABS tablet Take 1 tablet (500 mcg total) by mouth daily. 01/03/18   Mannam, Hart Robinsons, MD  SPIRIVA HANDIHALER 18 MCG inhalation capsule PLACE 1 CAPSULE (18 MCG TOTAL) INTO INHALER AND INHALE BY MOUTH DAILY. 03/17/18   [name], MD  VENTOLIN HFA 108 (90 Base) MCG/ACT inhaler INHALE 2 PUFFS BY MOUTH INTO THE LUNGS EVERY 4 (FOUR) HOURS AS NEEDED FOR WHEEZING OR SHORTNESS OF BREATH. 11/26/17   Marshell Garfinkel, MD    Family History Family History  Problem Relation Age of Onset  . Heart disease Mother   . COPD Father   . Lung cancer Maternal Grandfather   . Liver cancer Paternal Grandmother   . Colon cancer Neg Hx   . Colon polyps Neg Hx   . Esophageal cancer Neg Hx   . Rectal cancer Neg Hx   . Stomach cancer Neg Hx     Social History Social History   Tobacco Use  .  Smoking status: Light Tobacco Smoker    Packs/day: 0.00    Years: 49.00    Pack years: 0.00    Types: Cigarettes  . Smokeless tobacco: Never Used  . Tobacco comment: 1 cigarette daily-01/03/18  Substance Use Topics  . Alcohol use: No    Alcohol/week: 0.0 oz  . Drug use: No     Allergies   Hydrocodone and Propoxyphene n-acetaminophen   Review of Systems Review of Systems  Musculoskeletal: Positive for arthralgias.  Skin: Positive for color change. Negative for wound.     Physical Exam Updated Vital Signs  BP 135/70 (BP Location: Left Arm)   Pulse (!) 106   Temp 98 F (36.7 C) (Oral)   Resp 18   Ht 5\' 4"  (1.626 m)   Wt 47.6 kg (105 lb)   SpO2 98%   BMI 18.02 kg/m   Physical Exam  Constitutional: She is oriented to person, place, and time. She appears well-developed and well-nourished. No distress.  HENT:  Head: Normocephalic and atraumatic.  Right Ear: External ear normal.  Left Ear: External ear normal.  Nose: Nose normal.  Pulmonary/Chest: Effort normal.  Musculoskeletal:       Left hand: She exhibits tenderness.       Hands: Neurological: She is alert and oriented to person, place, and time.  Skin: Skin is warm and dry. She is not diaphoretic. No erythema.  Nursing note and vitals reviewed.    ED Treatments / Results  Labs (all labs ordered are listed, but only abnormal results are displayed) Labs Reviewed - No data to display  EKG None  Radiology No results found.  Procedures Procedures (including critical care time)  Medications Ordered in ED Medications - No data to display   Initial Impression / Assessment and Plan / ED Course  I have reviewed the triage vital signs and the nursing notes.  Pertinent labs & imaging results that were available during my care of the patient were reviewed by me and considered in my medical decision making (see chart for details).     Most likely this is onychomycosis given the slowly worsening symptoms  over a week without signs of cellulitis, paronychia, or other acute bacterial infection.  I will treat with topical antifungals.  I discussed that it may be more beneficial to take off the nail but she declines unless it gets worse.  I think this is reasonable.  Did discuss return precautions.  No injuries or other signs of trauma to suggest needing an x-ray.  Final Clinical Impressions(s) / ED Diagnoses   Final diagnoses:  Onychomycosis    ED Discharge Orders        Ordered    ciclopirox (PENLAC) 8 % solution  Daily at bedtime,   Status:  Discontinued     05/07/18 1147    ciclopirox (PENLAC) 8 % solution  Daily at bedtime    Note to Pharmacy:  Apply over nail and surrounding skin. Apply daily over previous coat. After seven (7) days, may remove with alcohol and continue cycle.   05/07/18 [place], MD 05/07/18 1156

## 2018-05-19 MED FILL — ALBUTEROL 0.083% INHAL SOLN: (2.5 MG/3ML | 7 days supply | Qty: 75 | Fill #1

## 2018-05-19 MED FILL — BREO ELLIPTA 100-25 MCG INH: 100-25 | 30 days supply | Qty: 60 | Fill #1

## 2018-05-19 MED FILL — SPIRIVA 18 MCG CP-HANDIHALE: 18 | 30 days supply | Qty: 30 | Fill #0

## 2018-06-03 MED FILL — ATORVASTATIN 40 MG TABLET: 40 | 30 days supply | Qty: 30 | Fill #1

## 2018-06-03 MED FILL — FLUTICASONE PROP 50 MCG SPR: 50 | 30 days supply | Qty: 16 | Fill #4

## 2018-06-03 MED FILL — raNITIdine HCL 300 MG TABS: 300 | 60 days supply | Qty: 60 | Fill #2

## 2018-06-03 MED FILL — VENTOLIN HFA 90 MCG INHALER: 108 (90 BAS | 17 days supply | Qty: 18 | Fill #1

## 2018-06-05 ENCOUNTER — Other Ambulatory Visit: Payer: Self-pay

## 2018-06-05 DIAGNOSIS — I2510 Atherosclerotic heart disease of native coronary artery without angina pectoris: Secondary | ICD-10-CM

## 2018-06-05 MED ORDER — ASPIRIN EC 81 MG PO TBEC: 81.0000 mg | DELAYED_RELEASE_TABLET | Freq: Every day | ORAL | 0 refills | Status: DC

## 2018-06-16 MED FILL — BREO ELLIPTA 100-25 MCG INH: 100-25 | 30 days supply | Qty: 60 | Fill #2

## 2018-06-16 MED FILL — SPIRIVA 18 MCG CP-HANDIHALE: 18 | 30 days supply | Qty: 30 | Fill #1

## 2018-06-16 MED FILL — ALBUTEROL 0.083% INHAL SOLN: (2.5 MG/3ML | 7 days supply | Qty: 75 | Fill #2

## 2018-06-19 ENCOUNTER — Encounter: Payer: Self-pay | Admitting: Family Medicine

## 2018-06-19 ENCOUNTER — Ambulatory Visit: Payer: Medicare HMO | Attending: Family Medicine | Admitting: Family Medicine

## 2018-06-19 VITALS — BP 118/60 | HR 111 | Temp 97.8°F | Ht 64.0 in | Wt 99.0 lb

## 2018-06-19 DIAGNOSIS — K219 Gastro-esophageal reflux disease without esophagitis: Secondary | ICD-10-CM

## 2018-06-19 DIAGNOSIS — N3281 Overactive bladder: Secondary | ICD-10-CM | POA: Diagnosis not present

## 2018-06-19 DIAGNOSIS — Z79899 Other long term (current) drug therapy: Secondary | ICD-10-CM | POA: Insufficient documentation

## 2018-06-19 DIAGNOSIS — J42 Unspecified chronic bronchitis: Secondary | ICD-10-CM | POA: Diagnosis not present

## 2018-06-19 DIAGNOSIS — Z131 Encounter for screening for diabetes mellitus: Secondary | ICD-10-CM | POA: Diagnosis not present

## 2018-06-19 DIAGNOSIS — K210 Gastro-esophageal reflux disease with esophagitis: Secondary | ICD-10-CM | POA: Insufficient documentation

## 2018-06-19 DIAGNOSIS — Z90710 Acquired absence of both cervix and uterus: Secondary | ICD-10-CM | POA: Insufficient documentation

## 2018-06-19 DIAGNOSIS — I2510 Atherosclerotic heart disease of native coronary artery without angina pectoris: Secondary | ICD-10-CM | POA: Insufficient documentation

## 2018-06-19 DIAGNOSIS — E7800 Pure hypercholesterolemia, unspecified: Secondary | ICD-10-CM | POA: Insufficient documentation

## 2018-06-19 DIAGNOSIS — Z9889 Other specified postprocedural states: Secondary | ICD-10-CM | POA: Insufficient documentation

## 2018-06-19 DIAGNOSIS — E785 Hyperlipidemia, unspecified: Secondary | ICD-10-CM | POA: Insufficient documentation

## 2018-06-19 DIAGNOSIS — Z886 Allergy status to analgesic agent status: Secondary | ICD-10-CM | POA: Insufficient documentation

## 2018-06-19 DIAGNOSIS — R634 Abnormal weight loss: Secondary | ICD-10-CM

## 2018-06-19 DIAGNOSIS — Z23 Encounter for immunization: Secondary | ICD-10-CM

## 2018-06-19 DIAGNOSIS — Z7982 Long term (current) use of aspirin: Secondary | ICD-10-CM | POA: Insufficient documentation

## 2018-06-19 DIAGNOSIS — Z881 Allergy status to other antibiotic agents status: Secondary | ICD-10-CM | POA: Insufficient documentation

## 2018-06-19 DIAGNOSIS — Z09 Encounter for follow-up examination after completed treatment for conditions other than malignant neoplasm: Secondary | ICD-10-CM | POA: Insufficient documentation

## 2018-06-19 DIAGNOSIS — Z720 Tobacco use: Secondary | ICD-10-CM | POA: Insufficient documentation

## 2018-06-19 DIAGNOSIS — J40 Bronchitis, not specified as acute or chronic: Secondary | ICD-10-CM

## 2018-06-19 DIAGNOSIS — Z9981 Dependence on supplemental oxygen: Secondary | ICD-10-CM | POA: Insufficient documentation

## 2018-06-19 LAB — POCT GLYCOSYLATED HEMOGLOBIN (HGB A1C): HbA1c, POC (prediabetic range): 5.3 % — AB (ref 5.7–6.4)

## 2018-06-19 MED ORDER — SPIRIVA HANDIHALER 18 MCG IN CAPS: ORAL_CAPSULE | RESPIRATORY_TRACT | 3 refills | Status: DC

## 2018-06-19 MED ORDER — FLUTICASONE FUROATE-VILANTEROL 100-25 MCG/INH IN AEPB: INHALATION_SPRAY | RESPIRATORY_TRACT | 3 refills | Status: DC

## 2018-06-19 MED ORDER — RANITIDINE HCL 300 MG PO TABS: 300.0000 mg | ORAL_TABLET | Freq: Every day | ORAL | 3 refills | Status: DC

## 2018-06-19 MED ORDER — OXYBUTYNIN CHLORIDE 5 MG PO TABS: 5.0000 mg | ORAL_TABLET | Freq: Two times a day (BID) | ORAL | 3 refills | Status: DC

## 2018-06-19 MED ORDER — AZITHROMYCIN 250 MG PO TABS: ORAL_TABLET | ORAL | 0 refills | Status: DC

## 2018-06-19 MED ORDER — ATORVASTATIN CALCIUM 40 MG PO TABS: 40.0000 mg | ORAL_TABLET | Freq: Every day | ORAL | 3 refills | Status: DC

## 2018-06-19 MED ORDER — PREDNISONE 20 MG PO TABS: 20.0000 mg | ORAL_TABLET | Freq: Every day | ORAL | 0 refills | Status: DC

## 2018-06-19 MED FILL — OXYBUTYNIN CHLORIDE 5 MG TA: 5 | 30 days supply | Qty: 60 | Fill #0

## 2018-06-19 MED FILL — AZITHROMYCIN 250 MG TABLET: 250 | 5 days supply | Qty: 6 | Fill #0

## 2018-06-19 MED FILL — predniSONE 20 MG TABS: 20 | 5 days supply | Qty: 5 | Fill #0

## 2018-06-19 NOTE — Progress Notes (Signed)
Patient would like diabetes screening and kidney function check.

## 2018-06-20 ENCOUNTER — Ambulatory Visit (HOSPITAL_BASED_OUTPATIENT_CLINIC_OR_DEPARTMENT_OTHER)
Admission: RE | Admit: 2018-06-20 | Discharge: 2018-06-20 | Disposition: A | Discharge status: Home / Self Care | Payer: Medicare HMO | Source: Ambulatory Visit | Attending: Family Medicine | Admitting: Family Medicine

## 2018-06-20 ENCOUNTER — Encounter (HOSPITAL_BASED_OUTPATIENT_CLINIC_OR_DEPARTMENT_OTHER): Payer: Self-pay

## 2018-06-20 ENCOUNTER — Other Ambulatory Visit: Payer: Self-pay | Admitting: Family Medicine

## 2018-06-20 DIAGNOSIS — Z1231 Encounter for screening mammogram for malignant neoplasm of breast: Secondary | ICD-10-CM | POA: Diagnosis present

## 2018-06-20 LAB — CMP14+EGFR
ALBUMIN: 4.8 g/dL (ref 3.6–4.8)
ALK PHOS: 69 IU/L (ref 39–117)
ALT: 7 IU/L (ref 0–32)
AST: 15 IU/L (ref 0–40)
Albumin/Globulin Ratio: 2.2 (ref 1.2–2.2)
BILIRUBIN TOTAL: 0.3 mg/dL (ref 0.0–1.2)
BUN / CREAT RATIO: 15 (ref 12–28)
BUN: 11 mg/dL (ref 8–27)
CHLORIDE: 104 mmol/L (ref 96–106)
CO2: 20 mmol/L (ref 20–29)
CREATININE: 0.71 mg/dL (ref 0.57–1.00)
Calcium: 9.6 mg/dL (ref 8.7–10.3)
GFR calc Af Amer: 101 mL/min/{1.73_m2} (ref >59)
GFR calc non Af Amer: 88 mL/min/{1.73_m2} (ref >59)
GLOBULIN, TOTAL: 2.2 g/dL (ref 1.5–4.5)
Glucose: 101 mg/dL — ABNORMAL HIGH (ref 65–99)
Potassium: 4.2 mmol/L (ref 3.5–5.2)
Sodium: 145 mmol/L — ABNORMAL HIGH (ref 134–144)
Total Protein: 7 g/dL (ref 6.0–8.5)

## 2018-06-20 LAB — T4, FREE: Free T4: 1.29 ng/dL (ref 0.82–1.77)

## 2018-06-20 LAB — TSH: TSH: 0.82 u[IU]/mL (ref 0.450–4.500)

## 2018-06-20 NOTE — Progress Notes (Signed)
Subjective:  Patient ID: Margaret Henry, female    DOB: 1950/10/16  Age: 67 y.o. MRN: [id number]  CC: COPD   HPI Margaret Henry is a 67 year old woman with a history of COPD, tobacco abuse, GERD, hyperlipidemia, chronic respiratory failure (of 2 L of oxygen at rest) who presents today for a follow-up visit.She has cut back on her smoking and smokes 1 cigarette/day with her morning coffee. Doing well on oxygen and does not need it when she is sitting at rest but on mild exertion requires it. Wondering why she has been losing weight (lost 6lbs in the last 3 months).  Endorses having a poor appetite and eats only sandwiches as she only cooks once a month because the smell of the food makes her sick. Thyroid panel from 06/2017 came back negative.  Her colonoscopy from 10/2015 reveals: An anal polyps and pathology of colon polyp revealed tubular adenoma, anal polyp revealed condylomata, hemorrhoids and repeat colonoscopy recommended in 5 years. She is scheduled for mammogram tomorrow. She complains of cough which is productive of yellowish sputum worse on lying down with associated wheezing but denies fever or chest pain.  She quit using Roflumilast due to ineffectiveness. She has an upcoming appointment with her pulmonologist.  Past Medical History:  Diagnosis Date  . Allergy    hayfever  . Bronchitis   . Cancer ([place])    skin cancer on chest  . Colon polyps   . Complication of anesthesia    pt states was given too much during nasal surgery 1989; difficulty getting awake  . COPD (chronic obstructive pulmonary disease) ([place])   . Diverticulitis   . GERD (gastroesophageal reflux disease)    occasional  . Hemorrhoids   . High cholesterol   . Osteoporosis   . Pneumonia   . Shortness of breath dyspnea    with exertion   . Thyroid goiter    bx benign  . Vertigo     Past Surgical History:  Procedure Laterality Date  . ABDOMINAL HYSTERECTOMY    . BILATERAL OOPHORECTOMY  2001   for  benign ovarian mass   . BIOPSY THYROID    . DENTAL SURGERY     dentures  . NASAL SEPTUM SURGERY    . PARTIAL HYSTERECTOMY  1976   for heavy menses   . RECTAL EXAM UNDER ANESTHESIA N/A 12/14/2015   Procedure: RECTAL EXAM UNDER ANESTHESIA REMOVAL OF ANAL CANAL MASS; INTERNAL HEMORRHOID LIGATION, EXTERNAL HEMORRHOID LIGATION;  Surgeon: Michael Boston, MD;  Location: [facility];  Service: General;  Laterality: N/A;  . TUBAL LIGATION    . WISDOM TOOTH EXTRACTION      Allergies  Allergen Reactions  . Hydrocodone Nausea And Vomiting  . Propoxyphene N-Acetaminophen Nausea And Vomiting     Outpatient Medications Prior to Visit  Medication Sig Dispense Refill  . acetaminophen (TYLENOL) 500 MG tablet Take 1,500 mg by mouth every 4 (four) hours as needed. For headache    . albuterol (PROVENTIL) (2.5 MG/3ML) 0.083% nebulizer solution Take 3 mLs (2.5 mg total) by nebulization every 6 (six) hours as needed for wheezing or shortness of breath. 75 mL 2  . albuterol (VENTOLIN HFA) 108 (90 Base) MCG/ACT inhaler INHALE 2 PUFFS BY MOUTH INTO THE LUNGS EVERY 4 (FOUR) HOURS AS NEEDED FOR WHEEZING OR SHORTNESS OF BREATH. 18 g 2  . aspirin EC 81 MG tablet Take 1 tablet (81 mg total) by mouth daily. 90 tablet 0  . ciclopirox (PENLAC) 8 %  solution Apply topically at bedtime. 6.6 mL 0  . fluticasone (FLONASE) 50 MCG/ACT nasal spray Place 2 sprays into both nostrils daily. 16 g 5  . loratadine (CLARITIN) 10 MG tablet TAKE 1 TABLET (10 MG TOTAL) BY MOUTH DAILY. 100 tablet 2  . Omega-3 Fatty Acids (FISH OIL) 1000 MG CAPS Take 1 capsule (1,000 mg total) by mouth daily. 30 capsule 6  . Oxymetazoline HCl (SINEX LONG-ACTING NA) Place 2 sprays into the nose daily. Reported on 01/19/2016    . Respiratory Therapy Supplies (FLUTTER) DEVI USE AS DIRECTED 1 each 0  . roflumilast (DALIRESP) 500 MCG TABS tablet Take 1 tablet (500 mcg total) by mouth daily. 30 tablet 11  . VENTOLIN HFA 108 (90 Base) MCG/ACT inhaler INHALE 2 PUFFS BY  MOUTH INTO THE LUNGS EVERY 4 (FOUR) HOURS AS NEEDED FOR WHEEZING OR SHORTNESS OF BREATH. 1 Inhaler 1  . atorvastatin (LIPITOR) 40 MG tablet Take 1 tablet (40 mg total) by mouth daily. 30 tablet 3  . fluticasone furoate-vilanterol (BREO ELLIPTA) 100-25 MCG/INH AEPB INHALE 1 PUFF BY MOUTH INTO THE LUNGS DAILY. 30 each 3  . meclizine (ANTIVERT) 25 MG tablet Take 1 tablet (25 mg total) by mouth 3 (three) times daily as needed for dizziness. 20 tablet 0  . oxybutynin (DITROPAN) 5 MG tablet Take 1 tablet (5 mg total) by mouth 2 (two) times daily. 60 tablet 3  . ranitidine (ZANTAC) 300 MG tablet Take 1 tablet (300 mg total) by mouth daily after supper. (30 mins after supper) 60 tablet 3  . SPIRIVA HANDIHALER 18 MCG inhalation capsule PLACE 1 CAPSULE (18 MCG TOTAL) INTO INHALER AND INHALE BY MOUTH DAILY. 30 capsule 3  . benzonatate (TESSALON) 100 MG capsule Take 1 capsule (100 mg total) by mouth 2 (two) times daily as needed for cough. (Patient not taking: Reported on 06/19/2018) 30 capsule 0  . azithromycin (ZITHROMAX) 250 MG tablet Tabs (500 mg) on day 1 then 1 tab (250 mg) on days 2-5 (Patient not taking: Reported on 06/19/2018) 6 tablet 0   No facility-administered medications prior to visit.     ROS Review of Systems  Constitutional: Positive for unexpected weight change. Negative for activity change, appetite change and fatigue.  HENT: Negative for congestion, sinus pressure and sore throat.   Eyes: Negative for visual disturbance.  Respiratory: Positive for cough and shortness of breath. Negative for chest tightness and wheezing.   Cardiovascular: Negative for chest pain and palpitations.  Gastrointestinal: Negative for abdominal distention, abdominal pain and constipation.  Endocrine: Negative for polydipsia.  Genitourinary: Negative for dysuria and frequency.  Musculoskeletal: Negative for arthralgias and back pain.  Skin: Negative for rash.  Neurological: Negative for tremors,  light-headedness and numbness.  Hematological: Does not bruise/bleed easily.  Psychiatric/Behavioral: Negative for agitation and behavioral problems.    Objective:  BP 118/60   Pulse (!) 111   Temp 97.8 F (36.6 C) (Oral)   Ht _0  (1.626 m)   Wt 99 lb (44.9 kg)   SpO2 96%   BMI 16.99 kg/m   BP/Weight 06/19/2018 05/07/2018 01/24/6221  Systolic BP 979 892 119  Diastolic BP 60 65 69  Wt. (Lbs) 99 105 105  BMI 16.99 18.02 18.02      Physical Exam  Constitutional: She is oriented to person, place, and time. She appears well-developed and well-nourished.  HENT:  Right Ear: External ear normal.  Left Ear: External ear normal.  Mouth/Throat: Oropharynx is clear and moist.  No sinus tenderness  Cardiovascular: Normal heart sounds and intact distal pulses. Tachycardia present.  No murmur heard. Pulmonary/Chest: Effort normal and breath sounds normal. She has no wheezes. She has no rales. She exhibits no tenderness.  Abdominal: Soft. Bowel sounds are normal. She exhibits no distension and no mass. There is no tenderness.  Musculoskeletal: Normal range of motion.  Neurological: She is alert and oriented to person, place, and time.  Skin: Skin is warm and dry.  Psychiatric: She has a normal mood and affect.   Lab Results  Component Value Date   HGBA1C 5.3 (A) 06/19/2018     Assessment & Plan:   1. Chronic bronchitis, unspecified chronic bronchitis type ([place]) Treat with azithromycin and prednisone - SPIRIVA HANDIHALER 18 MCG inhalation capsule; PLACE 1 CAPSULE (18 MCG TOTAL) INTO INHALER AND INHALE BY MOUTH DAILY.  Dispense: 30 capsule; Refill: 3 - fluticasone furoate-vilanterol (BREO ELLIPTA) 100-25 MCG/INH AEPB; INHALE 1 PUFF BY MOUTH INTO THE LUNGS DAILY.  Dispense: 30 each; Refill: 3  2. Gastroesophageal reflux disease, esophagitis presence not specified Stable - ranitidine (ZANTAC) 300 MG tablet; Take 1 tablet (300 mg total) by mouth at bedtime. (30 mins after supper)   Dispense: 30 tablet; Refill: 3  3. Overactive bladder Controlled - oxybutynin (DITROPAN) 5 MG tablet; Take 1 tablet (5 mg total) by mouth 2 (two) times daily.  Dispense: 60 tablet; Refill: 3  4. Coronary arteriosclerosis - CMP14+EGFR - atorvastatin (LIPITOR) 40 MG tablet; Take 1 tablet (40 mg total) by mouth daily.  Dispense: 30 tablet; Refill: 3 - TSH - T4, free  5. Hyperlipidemia, unspecified hyperlipidemia type Controlled - atorvastatin (LIPITOR) 40 MG tablet; Take 1 tablet (40 mg total) by mouth daily.  Dispense: 30 tablet; Refill: 3  6. Screening for diabetes mellitus A1c of 5.3 - POCT glycosylated hemoglobin (Hb A1C)  7. Bronchitis - azithromycin (ZITHROMAX) 250 MG tablet; Tabs (500 mg) on day 1 then 1 tab (250 mg) on days 2-5  Dispense: 6 tablet; Refill: 0  8. Weight loss Unknown etiology, thyroid panel was normal last year We will repeat again Poor appetite could contribute to weight loss  9. Need for immunization against influenza - Flu Vaccine QUAD 36+ mos IM   Meds ordered this encounter  Medications  . SPIRIVA HANDIHALER 18 MCG inhalation capsule    Sig: PLACE 1 CAPSULE (18 MCG TOTAL) INTO INHALER AND INHALE BY MOUTH DAILY.    Dispense:  30 capsule    Refill:  3  . ranitidine (ZANTAC) 300 MG tablet    Sig: Take 1 tablet (300 mg total) by mouth at bedtime. (30 mins after supper)    Dispense:  30 tablet    Refill:  3  . oxybutynin (DITROPAN) 5 MG tablet    Sig: Take 1 tablet (5 mg total) by mouth 2 (two) times daily.    Dispense:  60 tablet    Refill:  3  . fluticasone furoate-vilanterol (BREO ELLIPTA) 100-25 MCG/INH AEPB    Sig: INHALE 1 PUFF BY MOUTH INTO THE LUNGS DAILY.    Dispense:  30 each    Refill:  3  . atorvastatin (LIPITOR) 40 MG tablet    Sig: Take 1 tablet (40 mg total) by mouth daily.    Dispense:  30 tablet    Refill:  3  . azithromycin (ZITHROMAX) 250 MG tablet    Sig: Tabs (500 mg) on day 1 then 1 tab (250 mg) on days 2-5    Dispense:   6 tablet  Refill:  0  . predniSONE (DELTASONE) 20 MG tablet    Sig: Take 1 tablet (20 mg total) by mouth daily with breakfast.    Dispense:  5 tablet    Refill:  0    Follow-up: Return in about 3 months (around 09/18/2018) for Follow-up of chronic medical conditions.   [name] MD

## 2018-07-07 ENCOUNTER — Other Ambulatory Visit: Payer: Self-pay | Admitting: Family Medicine

## 2018-07-07 DIAGNOSIS — J42 Unspecified chronic bronchitis: Secondary | ICD-10-CM

## 2018-07-07 MED FILL — FLUTICASONE PROP 50 MCG SPR: 50 | 30 days supply | Qty: 16 | Fill #5

## 2018-07-07 MED FILL — ALBUTEROL 0.083% INHAL SOLN: (2.5 MG/3ML | 7 days supply | Qty: 75 | Fill #0

## 2018-07-08 ENCOUNTER — Ambulatory Visit (HOSPITAL_BASED_OUTPATIENT_CLINIC_OR_DEPARTMENT_OTHER)
Admission: RE | Admit: 2018-07-08 | Discharge: 2018-07-08 | Disposition: A | Discharge status: Home / Self Care | Payer: Medicare HMO | Source: Ambulatory Visit | Attending: Pulmonary Disease | Admitting: Pulmonary Disease

## 2018-07-08 ENCOUNTER — Ambulatory Visit (INDEPENDENT_AMBULATORY_CARE_PROVIDER_SITE_OTHER): Payer: Medicare HMO | Admitting: Pulmonary Disease

## 2018-07-08 ENCOUNTER — Encounter: Payer: Self-pay | Admitting: Pulmonary Disease

## 2018-07-08 VITALS — BP 122/60 | Ht 64.0 in | Wt 99.8 lb

## 2018-07-08 DIAGNOSIS — J449 Chronic obstructive pulmonary disease, unspecified: Secondary | ICD-10-CM

## 2018-07-08 DIAGNOSIS — F17210 Nicotine dependence, cigarettes, uncomplicated: Secondary | ICD-10-CM

## 2018-07-08 DIAGNOSIS — Z23 Encounter for immunization: Secondary | ICD-10-CM | POA: Diagnosis not present

## 2018-07-08 DIAGNOSIS — J948 Other specified pleural conditions: Secondary | ICD-10-CM | POA: Diagnosis not present

## 2018-07-08 MED ORDER — FLUTICASONE-UMECLIDIN-VILANT 100-62.5-25 MCG/INH IN AEPB: 1.0000 | INHALATION_SPRAY | Freq: Every day | RESPIRATORY_TRACT | 6 refills | Status: AC

## 2018-07-08 MED ORDER — PREDNISONE 10 MG PO TABS: ORAL_TABLET | ORAL | 0 refills | Status: DC

## 2018-07-08 MED ORDER — PANTOPRAZOLE SODIUM 40 MG PO TBEC: 40.0000 mg | DELAYED_RELEASE_TABLET | Freq: Every day | ORAL | 1 refills | Status: DC

## 2018-07-08 MED FILL — PANTOPRAZOLE SOD DR 40 MG T: 40 | 30 days supply | Qty: 30 | Fill #0

## 2018-07-08 MED FILL — predniSONE 10 MG TABS: 10 | 12 days supply | Qty: 30 | Fill #0

## 2018-07-08 MED FILL — TRELEGY ELLIPTA 100-62.5-25: 100-62.5-25 | 30 days supply | Qty: 60 | Fill #0

## 2018-07-08 NOTE — Patient Instructions (Addendum)
We will give another prednisone taper starting at 40 mg.  Reduce dose by 10 mg every 3 days We will stop the Breo and Spiriva and start you on a new inhaler called trelegy Continue using your nebulizers as needed We will get a chest x-ray today. We will start you on Protonix 40 mg once a day for acid reflux.  Take this at night before sleeping  Continue to work on smoking cessation Follow-up in 3 months.

## 2018-07-08 NOTE — Progress Notes (Signed)
ARI ENGELBRECHT    [id number]    11-29-1950  Primary Care Physician:Newlin, Charlane Ferretti, MD  Referring Physician: [name], MD [place], [place] [address]  Chief complaint:   Follow up for COPD GOLD D  HPI: [name] is a 67 year old with history of COPD GOLD D (CAT score 3, multiple exacerbations)diagnosed in 2014. Her symptoms consist mainly of shortness of breath, daily cough with whitish sputum production, wheeze, dyspnea and exertion. She does not have any hemoptysis, chest pain, palpitations. No fevers, chills.  She is currently maintained on Spiriva and breo. She is a current smoker. She had smoked one pack per day for nearly 25 years. She is trying to quit and is down to 1 pack every week. She had used the nicotine patch in the past. 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 She does not recall any particular exposures at work or at home.  Interim history: Continues on supplemental oxygen which she is using intermittently She had an exacerbation and given Z-Pak, prednisone by her primary care in mid September. She still continues to have significant dyspnea, worsening over the past 2 to 3 days Continues on [place], Spiriva, Daliresp  Continues to smoke 1 to 2 cigarettes/day.   Outpatient Encounter Medications as of 07/08/2018  Medication Sig  . acetaminophen (TYLENOL) 500 MG tablet Take 1,500 mg by mouth every 4 (four) hours as needed. For headache  . albuterol (PROVENTIL) (2.5 MG/3ML) 0.083% nebulizer solution TAKE 3 MLS (2.5 MG TOTAL) BY NEBULIZATION EVERY 6 (SIX) HOURS AS NEEDED FOR WHEEZING OR SHORTNESS OF BREATH.  Marland Kitchen albuterol (VENTOLIN HFA) 108 (90 Base) MCG/ACT inhaler INHALE 2 PUFFS BY MOUTH INTO THE LUNGS EVERY 4 (FOUR) HOURS AS NEEDED FOR WHEEZING OR SHORTNESS OF BREATH.  Marland Kitchen aspirin EC 81 MG tablet Take 1 tablet (81 mg total) by mouth daily.  Marland Kitchen atorvastatin (LIPITOR) 40 MG tablet Take 1 tablet (40 mg total) by mouth  daily.  . benzonatate (TESSALON) 100 MG capsule Take 1 capsule (100 mg total) by mouth 2 (two) times daily as needed for cough.  . ciclopirox (PENLAC) 8 % solution Apply topically at bedtime.  . fluticasone (FLONASE) 50 MCG/ACT nasal spray Place 2 sprays into both nostrils daily.  . fluticasone furoate-vilanterol (BREO ELLIPTA) 100-25 MCG/INH AEPB INHALE 1 PUFF BY MOUTH INTO THE LUNGS DAILY.  Marland Kitchen loratadine (CLARITIN) 10 MG tablet TAKE 1 TABLET (10 MG TOTAL) BY MOUTH DAILY.  Marland Kitchen Omega-3 Fatty Acids (FISH OIL) 1000 MG CAPS Take 1 capsule (1,000 mg total) by mouth daily.  . Oxymetazoline HCl (SINEX LONG-ACTING NA) Place 2 sprays into the nose daily. Reported on 01/19/2016  . ranitidine (ZANTAC) 300 MG tablet Take 1 tablet (300 mg total) by mouth at bedtime. (30 mins after supper)  . Respiratory Therapy Supplies (FLUTTER) DEVI USE AS DIRECTED  . SPIRIVA HANDIHALER 18 MCG inhalation capsule PLACE 1 CAPSULE (18 MCG TOTAL) INTO INHALER AND INHALE BY MOUTH DAILY.  . VENTOLIN HFA 108 (90 Base) MCG/ACT inhaler INHALE 2 PUFFS BY MOUTH INTO THE LUNGS EVERY 4 (FOUR) HOURS AS NEEDED FOR WHEEZING OR SHORTNESS OF BREATH.  Marland Kitchen azithromycin (ZITHROMAX) 250 MG tablet Tabs (500 mg) on day 1 then 1 tab (250 mg) on days 2-5 (Patient not taking: Reported on 07/08/2018)  . oxybutynin (DITROPAN) 5 MG tablet Take 1 tablet (5 mg total) by mouth 2 (two) times daily. (Patient not taking: Reported on 07/08/2018)  .  predniSONE (DELTASONE) 20 MG tablet Take 1 tablet (20 mg total) by mouth daily with breakfast. (Patient not taking: Reported on 07/08/2018)  . roflumilast (DALIRESP) 500 MCG TABS tablet Take 1 tablet (500 mcg total) by mouth daily. (Patient not taking: Reported on 07/08/2018)   No facility-administered encounter medications on file as of 07/08/2018.    Physical Exam: Blood pressure 122/60, height 5\' 4"  (1.626 m), weight 99 lb 12.8 oz (45.3 kg), SpO2 96 %. Gen:      No acute distress HEENT:  EOMI, sclera anicteric Neck:      No masses; no thyromegaly Lungs:   Diminished breath sounds, scattered expiratory wheeze. CV:         Regular rate and rhythm; no murmurs Abd:      + bowel sounds; soft, non-tender; no palpable masses, no distension Ext:    No edema; adequate peripheral perfusion Skin:      Warm and dry; no rash Neuro: alert and oriented x 3 Psych: normal mood and affect  Data Reviewed: Screening CT chest 03/07/18- calcified right thyroid nodule.  Severe centrilobular emphysema, bronchial wall thickening.  Previously described 4.8 mm right lower lobe nodule now measures 2.9 mm.  Granulomatous splenic and liver calcification. I reviewed the images personally.  PFTs (07/21/15) FVC 2.06 66%), FEV1 0.93 (39%), F/F 45, DLCO 36% Unable complete pleth. Severe obstructive lung disease, severe diffusion impairment  Spirometry 7/6/1 7 FVC 1.79 [5 7%), FEV1 0.65 (27%), F/F 36 Very severe obstructive airway disease  Assessment:  Severe COPD,Gold Stage D, Chronic bronchitis Still symptomatic, she had a recent exacerbation requiring antibiotics and prednisone Still has mild wheeze.  We will give her another round of prednisone Chest x-ray today.  No need for additional antibiotics unless the x-ray is abnormal.  We will stop the Breo and Spiriva.  Start trelegy inhaler She is supposed to be on Daliresp but is not taking.  I have asked her to resume it Continue supplemental oxygen Discussed pulmonary rehab but she is not interested  Pulmonary nodule Smaller on follow-up CT.  Continue annual low-dose screening CT of the chest.  Current smoker. Smoking cessation was re discussed with her.  She has cut down to 1 cigarette a day and plans on stopping altogether.  Does not want any additional help quitting.  Plan/Recommendations: - Stop Spiriva, breo.  Start trelegy - CXR - Resume Daliresp - Prednsione taper - Smoking cessation.  Marshell Garfinkel MD [facility] 07/08/2018, 10:32  AM  CC: [name], MD

## 2018-07-08 NOTE — Progress Notes (Signed)
Inhaler training given. In-check peak flow #:65 

## 2018-07-29 MED FILL — VENTOLIN HFA 90 MCG INHALER: 108 (90 BAS | 17 days supply | Qty: 18 | Fill #2

## 2018-07-29 MED FILL — ALBUTEROL 0.083% INHAL SOLN: (2.5 MG/3ML | 7 days supply | Qty: 75 | Fill #1

## 2018-08-04 ENCOUNTER — Telehealth: Payer: Self-pay | Admitting: Pulmonary Disease

## 2018-08-04 MED ORDER — TIOTROPIUM BROMIDE MONOHYDRATE 18 MCG IN CAPS: 18.0000 ug | ORAL_CAPSULE | Freq: Every day | RESPIRATORY_TRACT | 6 refills | Status: DC

## 2018-08-04 MED ORDER — FLUTICASONE FUROATE-VILANTEROL 100-25 MCG/INH IN AEPB: 1.0000 | INHALATION_SPRAY | Freq: Every day | RESPIRATORY_TRACT | 5 refills | Status: DC

## 2018-08-04 MED FILL — SPIRIVA 18 MCG CP-HANDIHALE: 18 | 30 days supply | Qty: 30 | Fill #0

## 2018-08-04 MED FILL — BREO ELLIPTA 100-25 MCG INH: 100-25 | 30 days supply | Qty: 60 | Fill #0

## 2018-08-04 NOTE — Telephone Encounter (Signed)
Called and spoke with pt letting her know that Dr. Vaughan Browner said she could go back on both the spiriva and breo instead of the trelegy.  Pt expressed understanding. I sent a new Rx of the spiriva and breo to pt's pharmacy. Nothing further needed.

## 2018-08-04 NOTE — Telephone Encounter (Signed)
Spoke with patient. She was last seen by Dr. Vaughan Browner on 07/08/18 and was advised to stop taking her Spiriva and Breo and switch to Trelegy. Per patient, Trelegy is not working for her. In fact, she stated that her congestion and SOB have actually increased with Trelegy. She is requesting to switch back to [place]. Advised patient to hold off on this switch until she hears back from Dr. Vaughan Browner, she verbalized understanding.   Dr. Vaughan Browner, please advise if you are ok with her going back to Spiriva and Breo. Thanks!

## 2018-08-04 NOTE — Telephone Encounter (Signed)
Ok to switch back to breo and spiriva

## 2018-08-11 MED FILL — ATORVASTATIN 40 MG TABLET: 40 | 30 days supply | Qty: 30 | Fill #2

## 2018-08-22 MED FILL — LORATADINE 10 MG TABLET: 10 | 100 days supply | Qty: 100 | Fill #2

## 2018-08-22 MED FILL — ALBUTEROL 0.083% INHAL SOLN: (2.5 MG/3ML | 7 days supply | Qty: 75 | Fill #2

## 2018-08-22 MED FILL — VENTOLIN HFA 90 MCG INHALER: 108 (90 BAS | 17 days supply | Qty: 18 | Fill #2

## 2018-08-22 MED FILL — FLUTICASONE PROP 50 MCG SPR: 50 | 30 days supply | Qty: 16 | Fill #1

## 2018-09-08 ENCOUNTER — Other Ambulatory Visit: Payer: Self-pay | Admitting: Family Medicine

## 2018-09-08 DIAGNOSIS — J42 Unspecified chronic bronchitis: Secondary | ICD-10-CM

## 2018-09-08 MED FILL — BREO ELLIPTA 100-25 MCG INH: 100-25 | 30 days supply | Qty: 60 | Fill #1

## 2018-09-08 MED FILL — ALBUTEROL 0.083% INHAL SOLN: (2.5 MG/3ML | 7 days supply | Qty: 75 | Fill #0

## 2018-09-08 MED FILL — SPIRIVA 18 MCG CP-HANDIHALE: 18 | 30 days supply | Qty: 30 | Fill #1

## 2018-09-18 ENCOUNTER — Ambulatory Visit: Payer: Medicare HMO | Admitting: Family Medicine

## 2018-09-24 DIAGNOSIS — J9611 Chronic respiratory failure with hypoxia: Secondary | ICD-10-CM | POA: Diagnosis not present

## 2018-09-24 DIAGNOSIS — J449 Chronic obstructive pulmonary disease, unspecified: Secondary | ICD-10-CM | POA: Diagnosis not present

## 2018-09-24 DIAGNOSIS — J42 Unspecified chronic bronchitis: Secondary | ICD-10-CM | POA: Diagnosis not present

## 2018-09-24 DIAGNOSIS — J439 Emphysema, unspecified: Secondary | ICD-10-CM | POA: Diagnosis not present

## 2018-09-26 ENCOUNTER — Other Ambulatory Visit: Payer: Self-pay | Admitting: Family Medicine

## 2018-09-26 DIAGNOSIS — J42 Unspecified chronic bronchitis: Secondary | ICD-10-CM

## 2018-09-26 MED FILL — VENTOLIN HFA 90 MCG INHALER: 108 (90 BAS | 17 days supply | Qty: 18 | Fill #0

## 2018-09-30 ENCOUNTER — Emergency Department (HOSPITAL_COMMUNITY): Payer: Medicare HMO

## 2018-09-30 ENCOUNTER — Encounter (HOSPITAL_COMMUNITY): Payer: Self-pay

## 2018-09-30 ENCOUNTER — Other Ambulatory Visit: Payer: Self-pay

## 2018-09-30 ENCOUNTER — Emergency Department (HOSPITAL_COMMUNITY)
Admission: EM | Admit: 2018-09-30 | Discharge: 2018-10-01 | Disposition: A | Discharge status: Home / Self Care | Payer: Medicare HMO | Attending: Emergency Medicine | Admitting: Emergency Medicine

## 2018-09-30 DIAGNOSIS — R0602 Shortness of breath: Secondary | ICD-10-CM | POA: Insufficient documentation

## 2018-09-30 DIAGNOSIS — F17210 Nicotine dependence, cigarettes, uncomplicated: Secondary | ICD-10-CM | POA: Insufficient documentation

## 2018-09-30 DIAGNOSIS — M549 Dorsalgia, unspecified: Secondary | ICD-10-CM | POA: Insufficient documentation

## 2018-09-30 DIAGNOSIS — J449 Chronic obstructive pulmonary disease, unspecified: Secondary | ICD-10-CM | POA: Diagnosis not present

## 2018-09-30 DIAGNOSIS — Z79899 Other long term (current) drug therapy: Secondary | ICD-10-CM | POA: Insufficient documentation

## 2018-09-30 DIAGNOSIS — Z85828 Personal history of other malignant neoplasm of skin: Secondary | ICD-10-CM | POA: Diagnosis not present

## 2018-09-30 DIAGNOSIS — R0902 Hypoxemia: Secondary | ICD-10-CM | POA: Diagnosis not present

## 2018-09-30 DIAGNOSIS — R000 Tachycardia, unspecified: Secondary | ICD-10-CM | POA: Insufficient documentation

## 2018-09-30 DIAGNOSIS — R062 Wheezing: Secondary | ICD-10-CM | POA: Diagnosis not present

## 2018-09-30 DIAGNOSIS — R0689 Other abnormalities of breathing: Secondary | ICD-10-CM | POA: Diagnosis not present

## 2018-09-30 DIAGNOSIS — Z7982 Long term (current) use of aspirin: Secondary | ICD-10-CM | POA: Insufficient documentation

## 2018-09-30 DIAGNOSIS — R05 Cough: Secondary | ICD-10-CM | POA: Insufficient documentation

## 2018-09-30 DIAGNOSIS — R52 Pain, unspecified: Secondary | ICD-10-CM | POA: Diagnosis not present

## 2018-09-30 LAB — CBC WITH DIFFERENTIAL/PLATELET
Abs Immature Granulocytes: 0.05 10*3/uL (ref 0.00–0.07)
BASOS ABS: 0.1 10*3/uL (ref 0.0–0.1)
Basophils Relative: 1 %
EOS ABS: 0.3 10*3/uL (ref 0.0–0.5)
EOS PCT: 2 %
HCT: 41 % (ref 36.0–46.0)
HEMOGLOBIN: 13.2 g/dL (ref 12.0–15.0)
Immature Granulocytes: 1 %
LYMPHS PCT: 23 %
Lymphs Abs: 2.4 10*3/uL (ref 0.7–4.0)
MCH: 31.4 pg (ref 26.0–34.0)
MCHC: 32.2 g/dL (ref 30.0–36.0)
MCV: 97.4 fL (ref 80.0–100.0)
Monocytes Absolute: 1 10*3/uL (ref 0.1–1.0)
Monocytes Relative: 9 %
NRBC: 0 % (ref 0.0–0.2)
Neutro Abs: 6.8 10*3/uL (ref 1.7–7.7)
Neutrophils Relative %: 64 %
Platelets: 264 10*3/uL (ref 150–400)
RBC: 4.21 MIL/uL (ref 3.87–5.11)
RDW: 12.2 % (ref 11.5–15.5)
WBC: 10.5 10*3/uL (ref 4.0–10.5)

## 2018-09-30 LAB — COMPREHENSIVE METABOLIC PANEL
ALBUMIN: 4.3 g/dL (ref 3.5–5.0)
ALT: 9 U/L (ref 0–44)
ANION GAP: 9 (ref 5–15)
AST: 16 U/L (ref 15–41)
Alkaline Phosphatase: 55 U/L (ref 38–126)
BUN: 10 mg/dL (ref 8–23)
CO2: 28 mmol/L (ref 22–32)
Calcium: 9.4 mg/dL (ref 8.9–10.3)
Chloride: 105 mmol/L (ref 98–111)
Creatinine, Ser: 0.7 mg/dL (ref 0.44–1.00)
GFR calc Af Amer: >60 mL/min (ref >60)
GFR calc non Af Amer: >60 mL/min (ref >60)
GLUCOSE: 131 mg/dL — AB (ref 70–99)
POTASSIUM: 3.8 mmol/L (ref 3.5–5.1)
SODIUM: 142 mmol/L (ref 135–145)
Total Bilirubin: 0.5 mg/dL (ref 0.3–1.2)
Total Protein: 7 g/dL (ref 6.5–8.1)

## 2018-09-30 LAB — BRAIN NATRIURETIC PEPTIDE: B Natriuretic Peptide: 25.4 pg/mL (ref 0.0–100.0)

## 2018-09-30 LAB — I-STAT TROPONIN, ED: TROPONIN I, POC: 0.01 ng/mL (ref 0.00–0.08)

## 2018-09-30 MED ORDER — ALBUTEROL SULFATE (2.5 MG/3ML) 0.083% IN NEBU
5.0000 mg | INHALATION_SOLUTION | Freq: Once | RESPIRATORY_TRACT | Status: AC
  Administered 2018-09-30: 5 mg via RESPIRATORY_TRACT
  Filled 2018-09-30: qty 6

## 2018-09-30 MED ORDER — METHYLPREDNISOLONE SODIUM SUCC 125 MG IJ SOLR
125.0000 mg | Freq: Once | INTRAMUSCULAR | Status: AC
  Administered 2018-09-30: 125 mg via INTRAVENOUS
  Filled 2018-09-30: qty 2

## 2018-09-30 MED ORDER — SODIUM CHLORIDE 0.9 % IV BOLUS
500.0000 mL | Freq: Once | INTRAVENOUS | Status: AC
  Administered 2018-09-30: 500 mL via INTRAVENOUS

## 2018-09-30 NOTE — ED Notes (Signed)
PTAR at bedside 

## 2018-09-30 NOTE — ED Notes (Signed)
PTAR contacted to take pt back to [address] [place], [place] [address]. Paperwork printed

## 2018-09-30 NOTE — ED Provider Notes (Signed)
[facility] DEPT Provider Note   CSN: [id number] Arrival date & time: 09/30/18  2036     History   Chief Complaint Chief Complaint  Patient presents with  . Shortness of Breath    HPI Margaret Henry is a 67 y.o. female with a past medical history of COPD, chronic respiratory failure on 2 L nasal cannula at home, skin cancer of her chest, tobacco abuse, who presents today for evaluation of shortness of breath.  She tells me that her shortness of breath got worse today.  She says that the last time her breathing was like this was a few months ago when she forgot to take her Brio.  She is unsure if she took her Brio or not today.  She was given a neb treatment by EMS in route.  She reports that she has not been sick recently.  She has not run out of any of her medications.   HPI  Past Medical History:  Diagnosis Date  . Allergy    hayfever  . Bronchitis   . Cancer ([place])    skin cancer on chest  . Colon polyps   . Complication of anesthesia    pt states was given too much during nasal surgery 1989; difficulty getting awake  . COPD (chronic obstructive pulmonary disease) ([place])   . Diverticulitis   . GERD (gastroesophageal reflux disease)    occasional  . Hemorrhoids   . High cholesterol   . Osteoporosis   . Pneumonia   . Shortness of breath dyspnea    with exertion   . Thyroid goiter    bx benign  . Vertigo     Patient Active Problem List   Diagnosis Date Noted  . Overactive bladder 03/17/2018  . Chronic respiratory failure ([place]) 10/04/2017  . Vertigo 04/21/2017  . Dyspnea 06/27/2016  . Seborrheic keratoses 06/12/2016  . Chest pain 06/12/2016  . Coronary arteriosclerosis 09/26/2015  . COPD exacerbation ([place]) 06/01/2013  . Unspecified vitamin D deficiency 03/02/2010  . Allergic rhinitis 03/02/2010  . BACK PAIN, LUMBAR 03/02/2010  . TROCHANTERIC BURSITIS, BILATERAL 03/02/2010  . THYROID NODULE, RIGHT 11/09/2009  . DEPRESSION  11/09/2009  . Hyperlipidemia 02/25/2009  . GERD 02/25/2009  . OSTEOPOROSIS 01/30/2009  . COMPRESSION FRACTURE, SPINE 01/21/2009  . TOBACCO ABUSE 09/07/2008  . COPD (chronic obstructive pulmonary disease) ([place]) 09/07/2008  . Goiter, unspecified 10/08/1997    Past Surgical History:  Procedure Laterality Date  . ABDOMINAL HYSTERECTOMY    . BILATERAL OOPHORECTOMY  2001   for benign ovarian mass   . BIOPSY THYROID    . DENTAL SURGERY     dentures  . NASAL SEPTUM SURGERY    . PARTIAL HYSTERECTOMY  1976   for heavy menses   . RECTAL EXAM UNDER ANESTHESIA N/A 12/14/2015   Procedure: RECTAL EXAM UNDER ANESTHESIA REMOVAL OF ANAL CANAL MASS; INTERNAL HEMORRHOID LIGATION, EXTERNAL HEMORRHOID LIGATION;  Surgeon: Michael Boston, MD;  Location: [facility];  Service: General;  Laterality: N/A;  . TUBAL LIGATION    . WISDOM TOOTH EXTRACTION       OB History   No obstetric history on file.      Home Medications    Prior to Admission medications   Medication Sig Start Date End Date Taking? Authorizing Provider  acetaminophen (TYLENOL) 500 MG tablet Take 1,500 mg by mouth every 4 (four) hours as needed. For headache   Yes [provider]  albuterol (PROVENTIL) (2.5 MG/3ML) 0.083% nebulizer solution TAKE  3 MLS (2.5 MG TOTAL) BY NEBULIZATION EVERY 6 (SIX) HOURS AS NEEDED FOR WHEEZING OR SHORTNESS OF BREATH. 09/08/18  Yes [name], MD  aspirin EC 81 MG tablet Take 1 tablet (81 mg total) by mouth daily. 06/05/18  Yes [name], MD  atorvastatin (LIPITOR) 40 MG tablet Take 1 tablet (40 mg total) by mouth daily. 06/19/18  Yes [name], MD  ciclopirox (PENLAC) 8 % solution Apply topically at bedtime. 05/07/18  Yes Sherwood Gambler, MD  fluticasone (FLONASE) 50 MCG/ACT nasal spray Place 2 sprays into both nostrils daily. 03/17/18  Yes Newlin, Enobong, MD  fluticasone furoate-vilanterol (BREO ELLIPTA) 100-25 MCG/INH AEPB Inhale 1 puff into the lungs daily. 08/04/18  Yes Mannam, Praveen,  MD  loratadine (CLARITIN) 10 MG tablet TAKE 1 TABLET (10 MG TOTAL) BY MOUTH DAILY. 02/24/18  Yes Mannam, Praveen, MD  Omega-3 Fatty Acids (FISH OIL) 1000 MG CAPS Take 1 capsule (1,000 mg total) by mouth daily. 09/06/16  Yes Dorothy Spark, MD  tiotropium (SPIRIVA) 18 MCG inhalation capsule Place 1 capsule (18 mcg total) into inhaler and inhale daily. 08/04/18  Yes Mannam, Praveen, MD  VENTOLIN HFA 108 (90 Base) MCG/ACT inhaler INHALE 2 PUFFS BY MOUTH INTO THE LUNGS EVERY 4 (FOUR) HOURS AS NEEDED FOR WHEEZING OR SHORTNESS OF BREATH. Patient taking differently: Inhale 2 puffs into the lungs every 4 (four) hours as needed. INHALE 2 PUFFS BY MOUTH INTO THE LUNGS EVERY 4 (FOUR) HOURS AS NEEDED FOR WHEEZING OR SHORTNESS OF BREATH. 09/26/18  Yes Newlin, Enobong, MD  azithromycin (ZITHROMAX) 250 MG tablet Tabs (500 mg) on day 1 then 1 tab (250 mg) on days 2-5 Patient not taking: Reported on 07/08/2018 06/19/18   [name], MD  benzonatate (TESSALON) 100 MG capsule Take 1 capsule (100 mg total) by mouth 2 (two) times daily as needed for cough. Patient not taking: Reported on 09/30/2018 03/17/18   [name], MD  oxybutynin (DITROPAN) 5 MG tablet Take 1 tablet (5 mg total) by mouth 2 (two) times daily. Patient not taking: Reported on 07/08/2018 06/19/18   [name], MD  pantoprazole (PROTONIX) 40 MG tablet Take 1 tablet (40 mg total) by mouth daily. Patient not taking: Reported on 09/30/2018 07/08/18   Marshell Garfinkel, MD  predniSONE (DELTASONE) 10 MG tablet 4 tabs x 3 days, 3 tabs x 3 days, 2 tabs x 3 days, 1 tab x 3 days then stop Patient not taking: Reported on 09/30/2018 07/08/18   Marshell Garfinkel, MD  predniSONE (DELTASONE) 20 MG tablet Take 1 tablet (20 mg total) by mouth daily with breakfast. Patient not taking: Reported on 07/08/2018 06/19/18   [name], MD  ranitidine (ZANTAC) 300 MG tablet Take 1 tablet (300 mg total) by mouth at bedtime. (30 mins after supper) Patient not  taking: Reported on 09/30/2018 06/19/18   [name], MD  Respiratory Therapy Supplies (FLUTTER) DEVI USE AS DIRECTED 03/11/17   Mannam, Hart Robinsons, MD  roflumilast (DALIRESP) 500 MCG TABS tablet Take 1 tablet (500 mcg total) by mouth daily. Patient not taking: Reported on 07/08/2018 01/03/18   Marshell Garfinkel, MD    Family History Family History  Problem Relation Age of Onset  . Heart disease Mother   . COPD Father   . Lung cancer Maternal Grandfather   . Liver cancer Paternal Grandmother   . Colon cancer Neg Hx   . Colon polyps Neg Hx   . Esophageal cancer Neg Hx   . Rectal cancer Neg Hx   .  Stomach cancer Neg Hx     Social History Social History   Tobacco Use  . Smoking status: Light Tobacco Smoker    Packs/day: 0.00    Years: 49.00    Pack years: 0.00    Types: Cigarettes  . Smokeless tobacco: Never Used  . Tobacco comment: 07/08/2018 SMokes about 2-3 cigarettes a week  Substance Use Topics  . Alcohol use: No    Alcohol/week: 0.0 standard drinks  . Drug use: No     Allergies   Hydrocodone and Propoxyphene n-acetaminophen   Review of Systems Review of Systems  Constitutional: Negative for chills and fever.  Respiratory: Positive for cough and shortness of breath. Negative for chest tightness.   Cardiovascular: Negative for chest pain, palpitations and leg swelling.  Gastrointestinal: Negative for abdominal pain, diarrhea, nausea and vomiting.  Genitourinary: Negative for dysuria and urgency.  Musculoskeletal: Negative for neck pain.  Skin: Negative for color change and rash.  Neurological: Negative for weakness and headaches.  All other systems reviewed and are negative.    Physical Exam Updated Vital Signs BP (!) 117/53   Pulse 96   Temp 97.9 F (36.6 C) (Oral)   Resp 19   SpO2 97%   Physical Exam Vitals signs and nursing note reviewed.  Constitutional:      General: She is not in acute distress.    Appearance: She is well-developed.  HENT:      Head: Normocephalic and atraumatic.     Mouth/Throat:     Mouth: Mucous membranes are moist.  Eyes:     Conjunctiva/sclera: Conjunctivae normal.  Neck:     Musculoskeletal: Normal range of motion and neck supple.     Vascular: JVD present.     Trachea: No tracheal deviation.  Cardiovascular:     Rate and Rhythm: Normal rate and regular rhythm.     Heart sounds: No murmur.  Pulmonary:     Breath sounds: Examination of the right-upper field reveals wheezing. Examination of the left-upper field reveals wheezing. Examination of the right-middle field reveals decreased breath sounds. Examination of the left-middle field reveals decreased breath sounds. Examination of the right-lower field reveals decreased breath sounds. Examination of the left-lower field reveals decreased breath sounds. Decreased breath sounds and wheezing present.     Comments: Mild increased work of breathing.  Sitting up right.  Mild supraclavicular retractions.  Chest:     Chest wall: No mass or deformity.  Abdominal:     Palpations: Abdomen is soft.     Tenderness: There is no abdominal tenderness.  Musculoskeletal:     Right lower leg: She exhibits no tenderness. No edema.     Left lower leg: She exhibits no tenderness. No edema.  Skin:    General: Skin is warm and dry.  Neurological:     General: No focal deficit present.     Mental Status: She is alert and oriented to person, place, and time.  Psychiatric:        Mood and Affect: Mood normal.        Behavior: Behavior normal.      ED Treatments / Results  Labs (all labs ordered are listed, but only abnormal results are displayed) Labs Reviewed  COMPREHENSIVE METABOLIC PANEL - Abnormal; Notable for the following components:      Result Value   Glucose, Bld 131 (*)    All other components within normal limits  BRAIN NATRIURETIC PEPTIDE  CBC WITH DIFFERENTIAL/PLATELET  I-STAT TROPONIN, ED  EKG EKG Interpretation  Date/Time:  Tuesday September 30 2018 21:00:17 EST Ventricular Rate:  104 PR Interval:    QRS Duration: 96 QT Interval:  341 QTC Calculation: 449 R Axis:   18 Text Interpretation:  Sinus tachycardia Consider right atrial enlargement Anterior infarct, old Baseline wander in lead(s) V3 Confirmed by Gerlene Fee [phone]) on 09/30/2018 9:20:15 PM   Radiology Dg Chest 2 View  Result Date: 09/30/2018 CLINICAL DATA:  Acute onset of shortness of breath, cough and wheezing. EXAM: CHEST - 2 VIEW COMPARISON:  Chest radiograph performed 07/08/2018 FINDINGS: The lungs are well-aerated. Minimal left basilar atelectasis or scarring is noted. There is no evidence of pleural effusion or pneumothorax. The heart is normal in size; the mediastinal contour is within normal limits. No acute osseous abnormalities are seen. IMPRESSION: Minimal left basilar atelectasis or scarring noted. Lungs otherwise clear. Electronically Signed   By: Garald Balding M.D.   On: 09/30/2018 21:33    Procedures Procedures (including critical care time)  Medications Ordered in ED Medications  albuterol (PROVENTIL) (2.5 MG/3ML) 0.083% nebulizer solution 5 mg (5 mg Nebulization Given 09/30/18 2113)  sodium chloride 0.9 % bolus 500 mL (0 mLs Intravenous Stopped 09/30/18 2302)  methylPREDNISolone sodium succinate (SOLU-MEDROL) 125 mg/2 mL injection 125 mg (125 mg Intravenous Given 09/30/18 2155)     Initial Impression / Assessment and Plan / ED Course  I have reviewed the triage vital signs and the nursing notes.  Pertinent labs & imaging results that were available during my care of the patient were reviewed by me and considered in my medical decision making (see chart for details).  Clinical Course as of Sep 30 2306  Tue Sep 30, 2018  2140 Notified that patient reports feeling worse after breathing treatment.  She is at 100% on her oxygen.  CXR reassuring.  Will observe.    [EH]  2302 Was informed that patient stated she is ready to go home.  Will  discharge.    [EH]    Clinical Course User Index [EH] Lorin Glass, PA-C   Patient presents today for evaluation of worsening shortness of breath.  She has had similar presentations in the past when she forgets to use her Brio inhaler and is unsure if she used it today or not.  On arrival she had generally decreased breath sounds with upper wheezes.  She was given 5 mg albuterol and 125 mg IV Solu-Medrol after which she reported that her breathing was much better and she wished to go home.  She is on her baseline 2 L/min nasal cannula oxygen.  Chest x-ray does not show evidence of acute abnormalities.  Her labs were reassuring, troponin is not elevated.  She does not report increased cough, increased sputum production, or increased sputum purulence, therefore do not feel like this is a exacerbation of COPD that would require antibiotics.  Return precautions were discussed with patient who states their understanding.  At the time of discharge patient denied any unaddressed complaints or concerns.  Patient is agreeable for discharge home.   Final Clinical Impressions(s) / ED Diagnoses   Final diagnoses:  Shortness of breath    ED Discharge Orders    None       Ollen Gross 09/30/18 2309    Maudie Flakes, MD 09/30/18 2317

## 2018-09-30 NOTE — ED Notes (Signed)
Bed: WA03 Expected date:  Expected time:  Means of arrival:  Comments: EMS 67 yo female difficulty breathing/hx COPD O2 sat 92 % RR 30-40 on neb

## 2018-09-30 NOTE — Discharge Instructions (Signed)
Today in the emergency room you were given multiple breathing treatments and fluids through your IV.  Your labs were reassuring, your chest x-ray does not show any concerning or new abnormalities.  You were given a dose of steroids through your IV in the emergency room.  Please call your pulmonologist to let them know you are in the emergency room.  Please make sure to keep your scheduled appointments.  If your shortness of breath returns, you develop worsening symptoms or have any new concerns please seek additional medical care and evaluation.

## 2018-09-30 NOTE — ED Notes (Signed)
Pt stated that she is feeling better and is ready to go home now. PA notified

## 2018-09-30 NOTE — ED Notes (Signed)
Patient transported to X-ray 

## 2018-09-30 NOTE — ED Triage Notes (Addendum)
Per ems: pt coming from home c/o shortness of breath that got worse today. Hx of COPD.  Clear upper lungs and diminished lower. Retractions and accessory muscles. Tripod position currently. EKG showed sinus tach.   Also c/o back pain that started two nights ago that resolved that is back.   Given neb treatment by ems en route

## 2018-09-30 NOTE — ED Notes (Signed)
Pt verbalized discharge instructions and follow up care. Alert and leaving with family member

## 2018-09-30 NOTE — ED Notes (Signed)
Pt stated they feel worse after breathing treatment. Oxygen sats at 100%. PA  notified

## 2018-09-30 NOTE — ED Notes (Signed)
Pt stating they cant make it home without oxygen. PTAR contacted

## 2018-10-01 DIAGNOSIS — R0602 Shortness of breath: Secondary | ICD-10-CM | POA: Diagnosis not present

## 2018-10-01 DIAGNOSIS — Z7401 Bed confinement status: Secondary | ICD-10-CM | POA: Diagnosis not present

## 2018-10-01 DIAGNOSIS — M2550 Pain in unspecified joint: Secondary | ICD-10-CM | POA: Diagnosis not present

## 2018-10-02 NOTE — Progress Notes (Signed)
@Patient  ID: Margaret Henry, female    DOB: 07-Oct-1951, 67 y.o.   MRN: [id number]  Chief Complaint  Patient presents with  . Acute Visit    SOB since Tuesday, getting worse, was in the ED , chest is tight    Referring provider: [name], MD  HPI:  67 year old female current every day smoker followed in our office for COPD, chronic respiratory failure, allergic rhinitis  PMH:  Smoker/ Smoking History: Current every day smoker.  Smoking 1 to 2 cigarettes a day.  73.5-pack-year smoking history. Maintenance: Spiriva, Breo Pt of: Dr. Vaughan Browner  10/03/2018  - Visit   67 year old female current every day smoker presenting to our office with increased shortness of breath, dyspnea, increased fatigue.  Patient was recently seen in the emergency room on 09/30/2018.  Patient was given IV Solu-Medrol which caused improvement in her breathing and allowed patient to be discharged within 6 hours of emergency room visit.  Patient is now still needing to use her rescue 3 times a day.  Patient denies purulent sputum, or increased sputum production.  Patient is afebrile today.  Patient does endorse persistent wheezing, dyspnea, fatigue.  Patient reports adherence to Breo Ellipta and Spiriva HandiHaler.  Patient was previously tried on Trelegy Ellipta but this made her breathing worse.  Patient continues to smoke 2 to 3 cigarettes a day.  MMRC - Breathlessness Score 4 - I am too breathless to leave the house or I am breathlessness when dressing    Tests:  09/30/2018-chest x-ray-minimal left basilar atelectasis or scarring, lungs otherwise clear  03/07/2018-CT chest lung cancer screening on 12 follow-up-lung RADS 2, follow-up in 12 months 05/07/2017-echocardiogram-LV ejection fraction 65 to 54%, grade 1 diastolic dysfunction,  FENO:  No results found for: NITRICOXIDE  PFT: PFT Results Latest Ref Rng & Units 07/21/2015  FVC-Pre L 2.06  FVC-Predicted Pre % 66  FVC-Post L 2.23  FVC-Predicted  Post % 72  Pre FEV1/FVC % % 45  Post FEV1/FCV % % 46  FEV1-Pre L 0.93  FEV1-Predicted Pre % 39  FEV1-Post L 1.03  DLCO UNC% % 36  DLCO COR %Predicted % 39    Imaging: Dg Chest 2 View  Result Date: 09/30/2018 CLINICAL DATA:  Acute onset of shortness of breath, cough and wheezing. EXAM: CHEST - 2 VIEW COMPARISON:  Chest radiograph performed 07/08/2018 FINDINGS: The lungs are well-aerated. Minimal left basilar atelectasis or scarring is noted. There is no evidence of pleural effusion or pneumothorax. The heart is normal in size; the mediastinal contour is within normal limits. No acute osseous abnormalities are seen. IMPRESSION: Minimal left basilar atelectasis or scarring noted. Lungs otherwise clear. Electronically Signed   By: Garald Balding M.D.   On: 09/30/2018 21:33      Specialty Problems      Pulmonary Problems   COPD (chronic obstructive pulmonary disease) (HCC)    Qualifier: Diagnosis of  By: Amil Amen MD, Elizabeth        Allergic rhinitis    Qualifier: Diagnosis of  By: Amil Amen MD, Elizabeth        COPD exacerbation [facility])   Dyspnea   Chronic respiratory failure (HCC)   COPD, group D, by GOLD 2017 classification ([place])      Allergies  Allergen Reactions  . Hydrocodone Nausea And Vomiting  . Propoxyphene N-Acetaminophen Nausea And Vomiting    Immunization History  Administered Date(s) Administered  . Influenza Split 07/07/2013, 07/05/2016  . Influenza Whole 11/09/2009  . Influenza,inj,Quad PF,6+ Mos 06/24/2015, 07/03/2017,  06/19/2018  . Pneumococcal Conjugate-13 09/26/2015  . Pneumococcal Polysaccharide-23 10/08/1998, 07/08/2018  . Td 10/08/2002  . Tdap 09/10/2016    Past Medical History:  Diagnosis Date  . Allergy    hayfever  . Bronchitis   . Cancer ([place])    skin cancer on chest  . Colon polyps   . Complication of anesthesia    pt states was given too much during nasal surgery 1989; difficulty getting awake  . COPD (chronic obstructive  pulmonary disease) ([place])   . Diverticulitis   . GERD (gastroesophageal reflux disease)    occasional  . Hemorrhoids   . High cholesterol   . Osteoporosis   . Pneumonia   . Shortness of breath dyspnea    with exertion   . Thyroid goiter    bx benign  . Vertigo     Tobacco History: Social History   Tobacco Use  Smoking Status Heavy Tobacco Smoker  . Packs/day: 1.50  . Years: 49.00  . Pack years: 73.50  . Types: Cigarettes  Smokeless Tobacco Never Used  Tobacco Comment   10/03/18 - 2-3 cigarette   Ready to quit: Not Answered Counseling given: Yes Comment: 10/03/18 - 2-3 cigarette  Smoking assessment and cessation counseling  Patient currently smoking: 2 to 3 cigarettes a day I have advised the patient to quit/stop smoking as soon as possible due to high risk for multiple medical problems.  It will also be very difficult for [place] to manage patient's  respiratory symptoms and status if we continue to expose her lungs to a known irritant.  We do not advise e-cigarettes as a form of stopping smoking.  Patient is willing to quit smoking.  Patient has not set a quit date.  I have advised the patient that we can assist and have options of nicotine replacement therapy, provided smoking cessation education today, provided smoking cessation counseling, and provided cessation resources.  Follow-up next office visit office visit for assessment of smoking cessation.  Smoking cessation counseling advised for: 5 min     Outpatient Encounter Medications as of 10/03/2018  Medication Sig  . acetaminophen (TYLENOL) 500 MG tablet Take 1,500 mg by mouth every 4 (four) hours as needed. For headache  . albuterol (PROVENTIL) (2.5 MG/3ML) 0.083% nebulizer solution TAKE 3 MLS (2.5 MG TOTAL) BY NEBULIZATION EVERY 6 (SIX) HOURS AS NEEDED FOR WHEEZING OR SHORTNESS OF BREATH.  Marland Kitchen aspirin EC 81 MG tablet Take 1 tablet (81 mg total) by mouth daily.  Marland Kitchen atorvastatin (LIPITOR) 40 MG tablet Take 1 tablet  (40 mg total) by mouth daily.  . benzonatate (TESSALON) 100 MG capsule Take 1 capsule (100 mg total) by mouth 2 (two) times daily as needed for cough.  . ciclopirox (PENLAC) 8 % solution Apply topically at bedtime.  . fluticasone (FLONASE) 50 MCG/ACT nasal spray Place 2 sprays into both nostrils daily.  . fluticasone furoate-vilanterol (BREO ELLIPTA) 100-25 MCG/INH AEPB Inhale 1 puff into the lungs daily.  Marland Kitchen loratadine (CLARITIN) 10 MG tablet TAKE 1 TABLET (10 MG TOTAL) BY MOUTH DAILY.  Marland Kitchen Omega-3 Fatty Acids (FISH OIL) 1000 MG CAPS Take 1 capsule (1,000 mg total) by mouth daily.  Marland Kitchen oxybutynin (DITROPAN) 5 MG tablet Take 1 tablet (5 mg total) by mouth 2 (two) times daily.  . pantoprazole (PROTONIX) 40 MG tablet Take 1 tablet (40 mg total) by mouth daily.  . ranitidine (ZANTAC) 300 MG tablet Take 1 tablet (300 mg total) by mouth at bedtime. (30 mins after supper)  .  Respiratory Therapy Supplies (FLUTTER) DEVI USE AS DIRECTED  . roflumilast (DALIRESP) 500 MCG TABS tablet Take 1 tablet (500 mcg total) by mouth daily.  Marland Kitchen tiotropium (SPIRIVA) 18 MCG inhalation capsule Place 1 capsule (18 mcg total) into inhaler and inhale daily.  . VENTOLIN HFA 108 (90 Base) MCG/ACT inhaler INHALE 2 PUFFS BY MOUTH INTO THE LUNGS EVERY 4 (FOUR) HOURS AS NEEDED FOR WHEEZING OR SHORTNESS OF BREATH. (Patient taking differently: Inhale 2 puffs into the lungs every 4 (four) hours as needed. INHALE 2 PUFFS BY MOUTH INTO THE LUNGS EVERY 4 (FOUR) HOURS AS NEEDED FOR WHEEZING OR SHORTNESS OF BREATH.)  . [DISCONTINUED] azithromycin (ZITHROMAX) 250 MG tablet Tabs (500 mg) on day 1 then 1 tab (250 mg) on days 2-5  . [DISCONTINUED] predniSONE (DELTASONE) 10 MG tablet 4 tabs x 3 days, 3 tabs x 3 days, 2 tabs x 3 days, 1 tab x 3 days then stop  . [DISCONTINUED] predniSONE (DELTASONE) 20 MG tablet Take 1 tablet (20 mg total) by mouth daily with breakfast.  . azithromycin (ZITHROMAX) 250 MG tablet 500mg  (two tablets) today, then 250mg  (1  tablet) for the next 4 days  . predniSONE (DELTASONE) 10 MG tablet 4 tabs for 2 days, then 3 tabs for 2 days, 2 tabs for 2 days, then 1 tab for 2 days, then stop   No facility-administered encounter medications on file as of 10/03/2018.      Review of Systems  Review of Systems  Constitutional: Positive for chills and fatigue. Negative for fever and unexpected weight change.  HENT: Positive for congestion (white / foamy ). Negative for postnasal drip, sinus pressure and sinus pain.   Respiratory: Positive for cough, chest tightness, shortness of breath and wheezing.   Cardiovascular: Negative for chest pain and palpitations.  Gastrointestinal: Negative for blood in stool, diarrhea, nausea and vomiting.  Musculoskeletal: Positive for back pain (left shoulder pain ). Negative for arthralgias.  Skin: Negative for color change.  Allergic/Immunologic: Negative for environmental allergies and food allergies.  Neurological: Negative for dizziness, light-headedness and headaches.  Psychiatric/Behavioral: Negative for dysphoric mood. The patient is not nervous/anxious.   All other systems reviewed and are negative.    Physical Exam  BP 120/60 (BP Location: Left Arm, Cuff Size: Normal)   Pulse (!) 105   Ht 5\' 4"  (1.626 m)   Wt 97 lb 6.4 oz (44.2 kg)   SpO2 93%   BMI 16.72 kg/m   Wt Readings from Last 5 Encounters:  10/03/18 97 lb 6.4 oz (44.2 kg)  07/08/18 99 lb 12.8 oz (45.3 kg)  06/19/18 99 lb (44.9 kg)  05/07/18 105 lb (47.6 kg)  03/17/18 105 lb (47.6 kg)     Physical Exam  Constitutional: She is oriented to person, place, and time and well-developed, well-nourished, and in no distress. No distress.  HENT:  Head: Normocephalic and atraumatic.  Right Ear: Hearing, tympanic membrane, external ear and ear canal normal.  Left Ear: Hearing, tympanic membrane, external ear and ear canal normal.  Nose: Mucosal edema present. Right sinus exhibits no maxillary sinus tenderness and no  frontal sinus tenderness. Left sinus exhibits no maxillary sinus tenderness and no frontal sinus tenderness.  Mouth/Throat: Uvula is midline and oropharynx is clear and moist. She has dentures. No oropharyngeal exudate.  postnasal drip  Eyes: Pupils are equal, round, and reactive to light.  Neck: Normal range of motion. Neck supple. No JVD present.  Cardiovascular: Normal rate, regular rhythm and normal heart sounds.  Pulmonary/Chest: Effort normal. No accessory muscle usage. No respiratory distress. She has decreased breath sounds (Decreased breath sounds throughout exam). She has wheezes (Slight expiratory wheeze). She has no rhonchi.  Abdominal: Soft. Bowel sounds are normal. There is no abdominal tenderness.  Musculoskeletal: Normal range of motion.  Lymphadenopathy:    She has no cervical adenopathy.  Neurological: She is alert and oriented to person, place, and time. Gait normal.  Skin: Skin is warm and dry. She is not diaphoretic. No erythema.  Psychiatric: Mood, memory, affect and judgment normal.  Nursing note and vitals reviewed.     Lab Results:  CBC    Component Value Date/Time   WBC 10.5 09/30/2018 2056   RBC 4.21 09/30/2018 2056   HGB 13.2 09/30/2018 2056   HGB 14.6 04/18/2017 1516   HCT 41.0 09/30/2018 2056   HCT 43.3 04/18/2017 1516   PLT 264 09/30/2018 2056   PLT 308 04/18/2017 1516   MCV 97.4 09/30/2018 2056   MCV 97 04/18/2017 1516   MCH 31.4 09/30/2018 2056   MCHC 32.2 09/30/2018 2056   RDW 12.2 09/30/2018 2056   RDW 13.7 04/18/2017 1516   LYMPHSABS 2.4 09/30/2018 2056   MONOABS 1.0 09/30/2018 2056   EOSABS 0.3 09/30/2018 2056   BASOSABS 0.1 09/30/2018 2056    BMET    Component Value Date/Time   NA 142 09/30/2018 2056   NA 145 (H) 06/19/2018 1153   K 3.8 09/30/2018 2056   CL 105 09/30/2018 2056   CO2 28 09/30/2018 2056   GLUCOSE 131 (H) 09/30/2018 2056   BUN 10 09/30/2018 2056   BUN 11 06/19/2018 1153   CREATININE 0.70 09/30/2018 2056    CREATININE 0.60 09/05/2016 1218   CALCIUM 9.4 09/30/2018 2056   GFRNONAA >60 09/30/2018 2056   GFRNONAA >89 03/26/2016 1022   GFRAA >60 09/30/2018 2056   GFRAA >89 03/26/2016 1022    BNP    Component Value Date/Time   BNP 25.4 09/30/2018 2056    ProBNP No results found for: PROBNP    Assessment & Plan:   Pleasant 67 year old female patient completing follow-up with our office today.  Patient with COPD exacerbation.  I do not believe the patient needs a chest x-ray as 09/30/2018 chest x-ray was clear.  May need to consider switching inhalers to nebulized medications as I struggle to believe the patient is able to get a deep enough inspiratory pull for Spiriva HandiHaler as well as Breo Ellipta.  Will bring patient back in a short-term follow-up to evaluate.  If patient is at a stable interval could consider switching to nebulized medications.  Or could consider nebulized Pulmicort and Stiolto Respimat.  Emphasized the importance the patient she needs to stop smoking.  Due to patient's reduced FEV1 and DLCO I do not believe she is a candidate for lung cancer screening program, I will double check with Eric Form nurse practitioner who runs our lung cancer screening program.  COPD, group D, by GOLD 2017 classification ([place]) Prednisone 10mg  tablet  >>>4 tabs for 2 days, then 3 tabs for 2 days, 2 tabs for 2 days, then 1 tab for 2 days, then stop >>>take with food  >>>take in the morning   Azithromycin 250mg  tablet  >>>Take 2 tablets (500mg  total) today, and then 1 tablet (250mg ) for the next four days  >>>take with food  >>>can also take probiotic and / or yogurt while on antibiotic   Breo Ellipta 100 >>> Take 1 puff daily in the  morning right when you wake up >>>Rinse your mouth out after use >>>This is a daily maintenance inhaler, NOT a rescue inhaler >>>Contact our office if you are having difficulties affording or obtaining this medication >>>It is important for you to be able  to take this daily and not miss any doses   Cont Spiriva Handihaler   Only use your albuterol as a rescue medication to be used if you can't catch your breath by resting or doing a relaxed purse lip breathing pattern.  - The less you use it, the better it will work when you need it. - Ok to use up to 2 puffs  every 4 hours if you must but call for immediate appointment if use goes up over your usual need - Don't leave home without it !!  (think of it like the spare tire for your car)   Note your daily symptoms > remember "red flags" for COPD:   >>>Increase in cough >>>increase in sputum production >>>increase in shortness of breath or activity  intolerance.   If you notice these symptoms, please call the office to be seen.   Follow up 2-4 weeks   TOBACCO ABUSE We recommend that you stop smoking.  >>>You need to set a quit date >>>If you have friends or family who smoke, let them know you are trying to quit and not to smoke around you or in your living environment  Smoking Cessation Resources:  1 [place]  >>> Patient to call this resource and utilize it to help support her quit smoking >>> Keep up your hard work with stopping smoking  You can also contact the [facility] >>>For smoking cessation classes call [phone]  We do not recommend using e-cigarettes as a form of stopping smoking  You can sign up for smoking cessation support texts and information:  >>>https://smokefree.gov/smokefreetxt        Lauraine Rinne, NP 10/03/2018   This appointment was 35 min long with over 50% of the time in direct face-to-face patient care, assessment, plan of care, and follow-up.

## 2018-10-03 ENCOUNTER — Ambulatory Visit (INDEPENDENT_AMBULATORY_CARE_PROVIDER_SITE_OTHER): Payer: Medicare HMO | Admitting: Pulmonary Disease

## 2018-10-03 ENCOUNTER — Encounter: Payer: Self-pay | Admitting: Pulmonary Disease

## 2018-10-03 VITALS — BP 120/60 | HR 105 | Ht 64.0 in | Wt 97.4 lb

## 2018-10-03 DIAGNOSIS — J449 Chronic obstructive pulmonary disease, unspecified: Secondary | ICD-10-CM

## 2018-10-03 DIAGNOSIS — F17210 Nicotine dependence, cigarettes, uncomplicated: Secondary | ICD-10-CM | POA: Diagnosis not present

## 2018-10-03 DIAGNOSIS — J441 Chronic obstructive pulmonary disease with (acute) exacerbation: Secondary | ICD-10-CM | POA: Insufficient documentation

## 2018-10-03 DIAGNOSIS — F17200 Nicotine dependence, unspecified, uncomplicated: Secondary | ICD-10-CM

## 2018-10-03 MED ORDER — PREDNISONE 10 MG PO TABS: ORAL_TABLET | ORAL | 0 refills | Status: DC

## 2018-10-03 MED ORDER — AZITHROMYCIN 250 MG PO TABS: ORAL_TABLET | ORAL | 0 refills | Status: DC

## 2018-10-03 MED FILL — predniSONE 10 MG TABS: 10 | 8 days supply | Qty: 20 | Fill #0

## 2018-10-03 MED FILL — AZITHROMYCIN 250 MG TABLET: 250 | 5 days supply | Qty: 6 | Fill #0

## 2018-10-03 NOTE — Assessment & Plan Note (Signed)
We recommend that you stop smoking.  >>>You need to set a quit date >>>If you have friends or family who smoke, let them know you are trying to quit and not to smoke around you or in your living environment  Smoking Cessation Resources:  1 800 QUIT NOW  >>> Patient to call this resource and utilize it to help support her quit smoking >>> Keep up your hard work with stopping smoking  You can also contact the  >>>For smoking cessation classes call [phone]  We do not recommend using e-cigarettes as a form of stopping smoking  You can sign up for smoking cessation support texts and information:  >>>https://smokefree.gov/smokefreetxt

## 2018-10-03 NOTE — Assessment & Plan Note (Signed)
Prednisone 10mg  tablet  >>>4 tabs for 2 days, then 3 tabs for 2 days, 2 tabs for 2 days, then 1 tab for 2 days, then stop >>>take with food  >>>take in the morning   Azithromycin 250mg  tablet  >>>Take 2 tablets (500mg  total) today, and then 1 tablet (250mg ) for the next four days  >>>take with food  >>>can also take probiotic and / or yogurt while on antibiotic   Breo Ellipta 100 >>> Take 1 puff daily in the morning right when you wake up >>>Rinse your mouth out after use >>>This is a daily maintenance inhaler, NOT a rescue inhaler >>>Contact our office if you are having difficulties affording or obtaining this medication >>>It is important for you to be able to take this daily and not miss any doses   Cont Spiriva Handihaler   Only use your albuterol as a rescue medication to be used if you can't catch your breath by resting or doing a relaxed purse lip breathing pattern.  - The less you use it, the better it will work when you need it. - Ok to use up to 2 puffs  every 4 hours if you must but call for immediate appointment if use goes up over your usual need - Don't leave home without it !!  (think of it like the spare tire for your car)   Note your daily symptoms > remember "red flags" for COPD:   >>>Increase in cough >>>increase in sputum production >>>increase in shortness of breath or activity  intolerance.   If you notice these symptoms, please call the office to be seen.   Follow up 2-4 weeks

## 2018-10-03 NOTE — Patient Instructions (Addendum)
Prednisone 10mg  tablet  >>>4 tabs for 2 days, then 3 tabs for 2 days, 2 tabs for 2 days, then 1 tab for 2 days, then stop >>>take with food  >>>take in the morning   Azithromycin 250mg  tablet  >>>Take 2 tablets (500mg  total) today, and then 1 tablet (250mg ) for the next four days  >>>take with food  >>>can also take probiotic and / or yogurt while on antibiotic   Breo Ellipta 100 >>> Take 1 puff daily in the morning right when you wake up >>>Rinse your mouth out after use >>>This is a daily maintenance inhaler, NOT a rescue inhaler >>>Contact our office if you are having difficulties affording or obtaining this medication >>>It is important for you to be able to take this daily and not miss any doses   Cont Spiriva Handihaler   Only use your albuterol as a rescue medication to be used if you can't catch your breath by resting or doing a relaxed purse lip breathing pattern.  - The less you use it, the better it will work when you need it. - Ok to use up to 2 puffs  every 4 hours if you must but call for immediate appointment if use goes up over your usual need - Don't leave home without it !!  (think of it like the spare tire for your car)   Note your daily symptoms > remember "red flags" for COPD:   >>>Increase in cough >>>increase in sputum production >>>increase in shortness of breath or activity  intolerance.   If you notice these symptoms, please call the office to be seen.   Follow up 2-4 weeks   It is flu season:   >>>Remember to be washing your hands regularly, using hand sanitizer, be careful to use around herself with has contact with people who are sick will increase her chances of getting sick yourself. >>> Best ways to protect herself from the flu: Receive the yearly flu vaccine, practice good hand hygiene washing with soap and also using hand sanitizer when available, eat a nutritious meals, get adequate rest, hydrate appropriately   Please contact the office if  your symptoms worsen or you have concerns that you are not improving.   Thank you for choosing [facility] for your healthcare, and for allowing [place] to partner with you on your healthcare journey. I am thankful to be able to provide care to you today.   Wyn Quaker FNP-C

## 2018-10-06 ENCOUNTER — Ambulatory Visit: Payer: Medicare HMO | Admitting: Primary Care

## 2018-10-09 ENCOUNTER — Other Ambulatory Visit: Payer: Self-pay | Admitting: Family Medicine

## 2018-10-09 DIAGNOSIS — J42 Unspecified chronic bronchitis: Secondary | ICD-10-CM

## 2018-10-09 MED FILL — BREO ELLIPTA 100-25 MCG INH: 100-25 | 30 days supply | Qty: 60 | Fill #2

## 2018-10-09 MED FILL — VENTOLIN HFA 90 MCG INHALER: 108 (90 BAS | 17 days supply | Qty: 18 | Fill #1

## 2018-10-13 MED FILL — ALBUTEROL 0.083% INHAL SOLN: (2.5 MG/3ML | 7 days supply | Qty: 75 | Fill #0

## 2018-10-15 ENCOUNTER — Encounter: Payer: Self-pay | Admitting: Cardiology

## 2018-10-15 ENCOUNTER — Ambulatory Visit (INDEPENDENT_AMBULATORY_CARE_PROVIDER_SITE_OTHER): Payer: Medicare HMO | Admitting: Cardiology

## 2018-10-15 VITALS — BP 102/64 | HR 103 | Ht 64.0 in | Wt 97.8 lb

## 2018-10-15 DIAGNOSIS — E785 Hyperlipidemia, unspecified: Secondary | ICD-10-CM

## 2018-10-15 DIAGNOSIS — I2510 Atherosclerotic heart disease of native coronary artery without angina pectoris: Secondary | ICD-10-CM | POA: Diagnosis not present

## 2018-10-15 DIAGNOSIS — J438 Other emphysema: Secondary | ICD-10-CM

## 2018-10-15 NOTE — Progress Notes (Signed)
Cardiology Office Note    Date:  10/15/2018   ID:  Margaret Henry, DOB October 04, 1951, MRN [id number]  PCP:  Margaret Rakes, MD  Cardiologist: Dr. Meda Coffee  Chief complaint : Shortness of breath   History of Present Illness:  Margaret Henry is a 68 y.o. female with history of over 50-pack-year of smoking, hyperlipidemia and COPD.  Chest CT for lung cancer showed calcification of all 3 coronary arteries as well as borderline size of ascending aorta and severe diffuse calcifications of the aortic root and descending thoracic aorta.  CT was reviewed by Dr. Meda Coffee who felt her a sending aorta was within normal limits.  Nuclear stress test 06/2016 was negative for ischemia or prior infarction.  Because of coronary calcification and elevated lipids the patient was started on atorvastatin 40 mill daily.  She also has a strong family history of CAD with her mother dying at 50 of an MI father an MI in his 36s and CABG in his 54s.  2D echo last year showed normal LVEF with grade 1 DD.  I saw her in 04/2017 complaining of dyspnea which I felt was most likely secondary to COPD.  2D echo showed normal LVEF 65-70% with grade 1 DD.  Patient saw Dr. Meda Coffee 08/23/17 complaining of worsening dyspnea on exertion and continues to smoke.  She felt she had underlying COPD complicated by acute bronchitis and prescribed Z-Pak and prednisone taper.  If she continued to have significant dyspnea she recommend doing a coronary CTA.   10/15/2018, the patient is coming for follow-up, she denies any chest pain and has stable shortness of breath on home oxygen 24/7 for severe COPD.  She recently saw her pulmonologist, and her shortness of breath got so much worse that she is scared to eat larger quantities of food in order to prevent gastric distention that further worsens her shortness of breath.  As a result she has lost 18 pounds in 1 year.  She continues to smoke few cigarettes a day.  She has no claudications.  No palpitations  dizziness or syncope she continues to drive.   Past Medical History:  Diagnosis Date  . Allergy    hayfever  . Bronchitis   . Cancer ([place])    skin cancer on chest  . Colon polyps   . Complication of anesthesia    pt states was given too much during nasal surgery 1989; difficulty getting awake  . COPD (chronic obstructive pulmonary disease) ([place])   . Diverticulitis   . GERD (gastroesophageal reflux disease)    occasional  . Hemorrhoids   . High cholesterol   . Osteoporosis   . Pneumonia   . Shortness of breath dyspnea    with exertion   . Thyroid goiter    bx benign  . Vertigo     Past Surgical History:  Procedure Laterality Date  . ABDOMINAL HYSTERECTOMY    . BILATERAL OOPHORECTOMY  2001   for benign ovarian mass   . BIOPSY THYROID    . DENTAL SURGERY     dentures  . NASAL SEPTUM SURGERY    . PARTIAL HYSTERECTOMY  1976   for heavy menses   . RECTAL EXAM UNDER ANESTHESIA N/A 12/14/2015   Procedure: RECTAL EXAM UNDER ANESTHESIA REMOVAL OF ANAL CANAL MASS; INTERNAL HEMORRHOID LIGATION, EXTERNAL HEMORRHOID LIGATION;  Surgeon: Michael Boston, MD;  Location: [facility];  Service: General;  Laterality: N/A;  . TUBAL LIGATION    . WISDOM TOOTH EXTRACTION  Current Medications: Current Meds  Medication Sig  . acetaminophen (TYLENOL) 500 MG tablet Take 1,500 mg by mouth every 4 (four) hours as needed. For headache  . albuterol (PROVENTIL) (2.5 MG/3ML) 0.083% nebulizer solution TAKE 3 MLS (2.5 MG TOTAL) BY NEBULIZATION EVERY 6 (SIX) HOURS AS NEEDED FOR WHEEZING OR SHORTNESS OF BREATH.  Marland Kitchen aspirin EC 81 MG tablet Take 1 tablet (81 mg total) by mouth daily.  . fluticasone (FLONASE) 50 MCG/ACT nasal spray Place 2 sprays into both nostrils daily.  . fluticasone furoate-vilanterol (BREO ELLIPTA) 100-25 MCG/INH AEPB Inhale 1 puff into the lungs daily.  Marland Kitchen loratadine (CLARITIN) 10 MG tablet TAKE 1 TABLET (10 MG TOTAL) BY MOUTH DAILY.  Marland Kitchen OXYGEN Inhale 2 L into the lungs.  . ranitidine  (ZANTAC) 300 MG tablet Take 1 tablet (300 mg total) by mouth at bedtime. (30 mins after supper)  . tiotropium (SPIRIVA) 18 MCG inhalation capsule Place 1 capsule (18 mcg total) into inhaler and inhale daily.  . VENTOLIN HFA 108 (90 Base) MCG/ACT inhaler INHALE 2 PUFFS BY MOUTH INTO THE LUNGS EVERY 4 (FOUR) HOURS AS NEEDED FOR WHEEZING OR SHORTNESS OF BREATH.     Allergies:   Hydrocodone and Propoxyphene n-acetaminophen   Social History   Socioeconomic History  . Marital status: Widowed    Spouse name: Not on file  . Number of children: Not on file  . Years of education: Not on file  . Highest education level: Not on file  Occupational History  . Not on file  Social Needs  . Financial resource strain: Not on file  . Food insecurity:    Worry: Not on file    Inability: Not on file  . Transportation needs:    Medical: Not on file    Non-medical: Not on file  Tobacco Use  . Smoking status: Heavy Tobacco Smoker    Packs/day: 1.50    Years: 49.00    Pack years: 73.50    Types: Cigarettes  . Smokeless tobacco: Never Used  . Tobacco comment: 10/03/18 - 2-3 cigarette  Substance and Sexual Activity  . Alcohol use: No    Alcohol/week: 0.0 standard drinks  . Drug use: No  . Sexual activity: Not on file  Lifestyle  . Physical activity:    Days per week: Not on file    Minutes per session: Not on file  . Stress: Not on file  Relationships  . Social connections:    Talks on phone: Not on file    Gets together: Not on file    Attends religious service: Not on file    Active member of club or organization: Not on file    Attends meetings of clubs or organizations: Not on file    Relationship status: Not on file  Other Topics Concern  . Not on file  Social History Narrative  . Not on file     Family History:  The patient's family history includes COPD in her father; Heart disease in her mother; Liver cancer in her paternal grandmother; Lung cancer in her maternal grandfather.     ROS:   Please see the history of present illness.    Review of Systems  Constitution: Negative.  HENT: Negative.   Eyes: Negative.   Cardiovascular: Positive for dyspnea on exertion.  Respiratory: Positive for cough and wheezing.   Hematologic/Lymphatic: Bruises/bleeds easily.  Musculoskeletal: Negative.  Negative for joint pain.  Gastrointestinal: Negative.   Genitourinary: Negative.   Neurological: Negative.  All other systems reviewed and are negative.   PHYSICAL EXAM:   VS:  BP 102/64   Pulse (!) 103   Ht 5\' 4"  (1.626 m)   Wt 97 lb 12.8 oz (44.4 kg)   SpO2 97%   BMI 16.79 kg/m   Physical Exam  GEN: Well nourished, well developed, in no acute distress  Neck: no JVD, carotid bruits, or masses Cardiac:RRR; no murmurs, rubs, or gallops  Respiratory: Decreased breath sounds without wheezing GI: soft, nontender, nondistended, + BS Ext: without cyanosis, clubbing, or edema, Good distal pulses bilaterally Neuro:  Alert and Oriented x 3 Psych: euthymic mood, full affect  Wt Readings from Last 3 Encounters:  10/15/18 97 lb 12.8 oz (44.4 kg)  10/03/18 97 lb 6.4 oz (44.2 kg)  07/08/18 99 lb 12.8 oz (45.3 kg)      Studies/Labs Reviewed:   EKG:  EKG is not ordered today.    Recent Labs: 06/19/2018: TSH 0.820 09/30/2018: ALT 9; B Natriuretic Peptide 25.4; BUN 10; Creatinine, Ser 0.70; Hemoglobin 13.2; Platelets 264; Potassium 3.8; Sodium 142   Lipid Panel    Component Value Date/Time   CHOL 160 01/06/2018 1058   TRIG 161 (H) 01/06/2018 1058   HDL 51 01/06/2018 1058   CHOLHDL 3.1 01/06/2018 1058   CHOLHDL 3.1 09/05/2016 1218   VLDL 39 (H) 09/05/2016 1218   LDLCALC 77 01/06/2018 1058   LDLDIRECT 166 (H) 09/26/2015 1001    Additional studies/ records that were reviewed today include:  2D echo 05/07/17 Study Conclusions   - Left ventricle: The cavity size was normal. Wall thickness was   normal. Systolic function was vigorous. The estimated ejection   fraction  was in the range of 65% to 70%. Wall motion was normal;   there were no regional wall motion abnormalities. Doppler   parameters are consistent with abnormal left ventricular   relaxation (grade 1 diastolic dysfunction). - Aortic valve: There was mild regurgitation.   Impressions: - Vigorous LV systolic function; mild diastolic dysfunction; aortic   valve not well visualized but appears to be mildly calcified;   mildly elevated velocity of 2.2 m/s likely related to vigorous LV   function; mild AI.    The left ventricular ejection fraction is hyperdynamic (>65%).  Nuclear stress EF: 74%.  There was no ST segment deviation noted during stress.  The study is normal.  This is a low risk study.   Normal resting and stress perfusion. No ischemia or infarction EF 74%  CT chest without contrast 08/2015 IMPRESSION: 1. Lung-RADS Category 2, benign appearance or behavior. Continue annual screening with low-dose chest CT without contrast in 12 months. Centrilobular emphysema with at least 1 pulmonary nodule. 2.  Atherosclerosis, including within the coronary arteries. 3. Indeterminate partially calcified right-sided thyroid nodule. Consider ultrasound    ASSESSMENT:    1. Hyperlipidemia, unspecified hyperlipidemia type   2. Other emphysema ([place])   3. Coronary arteriosclerosis      PLAN:  In order of problems listed above:  Coronary arthrosclerosis on CT in 2016 Lexiscan Myoview normal 06/2016.  Continue Lipitor and aspirin.  Follow-up with Dr. Meda Coffee in 6 months.  Advised to quit smoking, her EKG is normal other than sinus tachycardia related to her COPD, no ischemic work-up is indicated.  Continue aggressive medical management.  Tobacco abuse smoking cessation essential and discussed again with patient  Hyperlipidemia continue Lipitor  COPD/emphysema -followed by pulmonary, however this is end-stage COPD now with muscle wasting and significant weight loss.  Medication  Adjustments/Labs and Tests Ordered: Current medicines are reviewed at length with the patient today.  Concerns regarding medicines are outlined above.  Medication changes, Labs and Tests ordered today are listed in the Patient Instructions below. Patient Instructions  Medication Instructions:   Your physician recommends that you continue on your current medications as directed. Please refer to the Current Medication list given to you today.  If you need a refill on your cardiac medications before your next appointment, please call your pharmacy.        Follow-Up: At [facility], you and your health needs are our priority.  As part of our continuing mission to provide you with exceptional heart care, we have created designated Provider Care Teams.  These Care Teams include your primary Cardiologist (physician) and Advanced Practice Providers (APPs -  Physician Assistants and Nurse Practitioners) who all work together to provide you with the care you need, when you need it. You will need a follow up appointment in 6 months.  Please call our office 2 months in advance to schedule this appointment.  You may see Ena Dawley, MD or one of the following Advanced Practice Providers on your designated Care Team:   [place], PA-C Melina Copa, PA-C . Ermalinda Barrios, PA-C        Signed, Ena Dawley, MD  10/15/2018 12:38 PM    [facility] [address], [place], [place]  [address] Phone: [phone]; Fax: [phone]

## 2018-10-15 NOTE — Patient Instructions (Signed)
Medication Instructions:   Your physician recommends that you continue on your current medications as directed. Please refer to the Current Medication list given to you today.  If you need a refill on your cardiac medications before your next appointment, please call your pharmacy.       Follow-Up: At [facility], you and your health needs are our priority.  As part of our continuing mission to provide you with exceptional heart care, we have created designated Provider Care Teams.  These Care Teams include your primary Cardiologist (physician) and Advanced Practice Providers (APPs -  Physician Assistants and Nurse Practitioners) who all work together to provide you with the care you need, when you need it. You will need a follow up appointment in 6 months.  Please call our office 2 months in advance to schedule this appointment.  You may see Katarina Nelson, MD or one of the following Advanced Practice Providers on your designated Care Team:   Brittainy Simmons, PA-C Dayna Dunn, PA-C . Michele Lenze, PA-C     

## 2018-10-20 ENCOUNTER — Ambulatory Visit: Payer: Medicare HMO | Admitting: Family Medicine

## 2018-10-22 ENCOUNTER — Encounter: Payer: Self-pay | Admitting: Family Medicine

## 2018-10-22 ENCOUNTER — Ambulatory Visit: Payer: Medicare HMO | Attending: Family Medicine | Admitting: Family Medicine

## 2018-10-22 VITALS — BP 124/67 | HR 104 | Temp 97.4°F | Ht 64.0 in | Wt 96.4 lb

## 2018-10-22 DIAGNOSIS — R6881 Early satiety: Secondary | ICD-10-CM | POA: Diagnosis not present

## 2018-10-22 DIAGNOSIS — J449 Chronic obstructive pulmonary disease, unspecified: Secondary | ICD-10-CM

## 2018-10-22 DIAGNOSIS — L03012 Cellulitis of left finger: Secondary | ICD-10-CM | POA: Diagnosis not present

## 2018-10-22 DIAGNOSIS — F17200 Nicotine dependence, unspecified, uncomplicated: Secondary | ICD-10-CM

## 2018-10-22 DIAGNOSIS — F17210 Nicotine dependence, cigarettes, uncomplicated: Secondary | ICD-10-CM

## 2018-10-22 DIAGNOSIS — M6281 Muscle weakness (generalized): Secondary | ICD-10-CM | POA: Diagnosis not present

## 2018-10-22 DIAGNOSIS — J9611 Chronic respiratory failure with hypoxia: Secondary | ICD-10-CM | POA: Diagnosis not present

## 2018-10-22 DIAGNOSIS — Z131 Encounter for screening for diabetes mellitus: Secondary | ICD-10-CM

## 2018-10-22 LAB — POCT GLYCOSYLATED HEMOGLOBIN (HGB A1C): HEMOGLOBIN A1C: 5.4 % (ref 4.0–5.6)

## 2018-10-22 MED ORDER — PREDNISONE 20 MG PO TABS: 20.0000 mg | ORAL_TABLET | Freq: Two times a day (BID) | ORAL | 0 refills | Status: DC

## 2018-10-22 MED ORDER — CEPHALEXIN 500 MG PO CAPS: 500.0000 mg | ORAL_CAPSULE | Freq: Two times a day (BID) | ORAL | 0 refills | Status: DC

## 2018-10-22 MED FILL — CEPHALEXIN 500 MG CAPSULE: 500 | 10 days supply | Qty: 20 | Fill #0

## 2018-10-22 MED FILL — predniSONE 20 MG TABS: 20 | 5 days supply | Qty: 10 | Fill #0

## 2018-10-22 NOTE — Progress Notes (Signed)
Subjective:  Patient ID: Margaret Henry, female    DOB: [birth year]  Age: 68 y.o. MRN: [id number]  CC: COPD   HPI Margaret Henry is a 68 year old woman with a history of COPD, tobacco abuse, GERD, hyperlipidemia, chronic respiratory failure (of 2 L of oxygen at rest) who presents today for a follow-up visit. She previously used oxygen as needed but now complains of having to use it all the time.  She complains of cough but is unable to produce any mucus, also has wheezing, has been short of breath with chest tightness for the last 4 days. She was seen by pulmonary on 10/03/2018 at which time she was placed on a Z-Pak and a prednisone taper and 3 days prior to that has been treated for dyspnea with IV Solu-Medrol at the ED. She denies fever, rhinorrhea, sinus tenderness, myalgias and continues to smoke 1 cigarette/day.  She is also concerned about early satiety even when she eats small portions of food.  She is concerned she has no strength and is unable to perform regular activities and would like to be referred for physical therapy. She complains of left middle finger pain around the nail and states the nail is loose but has not fallen off.  Yesterday she developed redness around the cuticle of the nail but denies drainage from the nail.  Past Medical History:  Diagnosis Date  . Allergy    hayfever  . Bronchitis   . Cancer ([place])    skin cancer on chest  . Colon polyps   . Complication of anesthesia    pt states was given too much during nasal surgery 1989; difficulty getting awake  . COPD (chronic obstructive pulmonary disease) ([place])   . Diverticulitis   . GERD (gastroesophageal reflux disease)    occasional  . Hemorrhoids   . High cholesterol   . Osteoporosis   . Pneumonia   . Shortness of breath dyspnea    with exertion   . Thyroid goiter    bx benign  . Vertigo     Past Surgical History:  Procedure Laterality Date  . ABDOMINAL HYSTERECTOMY    . BILATERAL OOPHORECTOMY   2001   for benign ovarian mass   . BIOPSY THYROID    . DENTAL SURGERY     dentures  . NASAL SEPTUM SURGERY    . PARTIAL HYSTERECTOMY  1976   for heavy menses   . RECTAL EXAM UNDER ANESTHESIA N/A 12/14/2015   Procedure: RECTAL EXAM UNDER ANESTHESIA REMOVAL OF ANAL CANAL MASS; INTERNAL HEMORRHOID LIGATION, EXTERNAL HEMORRHOID LIGATION;  Surgeon: Michael Boston, MD;  Location: [facility];  Service: General;  Laterality: N/A;  . TUBAL LIGATION    . WISDOM TOOTH EXTRACTION      Allergies  Allergen Reactions  . Hydrocodone Nausea And Vomiting  . Propoxyphene N-Acetaminophen Nausea And Vomiting     Outpatient Medications Prior to Visit  Medication Sig Dispense Refill  . acetaminophen (TYLENOL) 500 MG tablet Take 1,500 mg by mouth every 4 (four) hours as needed. For headache    . albuterol (PROVENTIL) (2.5 MG/3ML) 0.083% nebulizer solution TAKE 3 MLS (2.5 MG TOTAL) BY NEBULIZATION EVERY 6 (SIX) HOURS AS NEEDED FOR WHEEZING OR SHORTNESS OF BREATH. 75 mL 2  . aspirin EC 81 MG tablet Take 1 tablet (81 mg total) by mouth daily. 90 tablet 0  . fluticasone (FLONASE) 50 MCG/ACT nasal spray Place 2 sprays into both nostrils daily. 16 g 5  . fluticasone  furoate-vilanterol (BREO ELLIPTA) 100-25 MCG/INH AEPB Inhale 1 puff into the lungs daily. 60 each 5  . loratadine (CLARITIN) 10 MG tablet TAKE 1 TABLET (10 MG TOTAL) BY MOUTH DAILY. 100 tablet 2  . OXYGEN Inhale 2 L into the lungs.    . tiotropium (SPIRIVA) 18 MCG inhalation capsule Place 1 capsule (18 mcg total) into inhaler and inhale daily. 30 capsule 6  . VENTOLIN HFA 108 (90 Base) MCG/ACT inhaler INHALE 2 PUFFS BY MOUTH INTO THE LUNGS EVERY 4 (FOUR) HOURS AS NEEDED FOR WHEEZING OR SHORTNESS OF BREATH. 18 g 2  . ranitidine (ZANTAC) 300 MG tablet Take 1 tablet (300 mg total) by mouth at bedtime. (30 mins after supper) (Patient not taking: Reported on 10/22/2018) 30 tablet 3   No facility-administered medications prior to visit.     ROS Review of  Systems  Constitutional: Positive for fatigue. Negative for activity change and appetite change.  HENT: Negative for congestion, sinus pressure and sore throat.   Eyes: Negative for visual disturbance.  Respiratory: Positive for cough and shortness of breath. Negative for chest tightness and wheezing.   Cardiovascular: Negative for chest pain and palpitations.  Gastrointestinal: Negative for abdominal distention, abdominal pain and constipation.  Endocrine: Negative for polydipsia.  Genitourinary: Negative for dysuria and frequency.  Musculoskeletal: Negative for arthralgias and back pain.  Skin: Negative for rash.  Neurological: Negative for tremors, light-headedness and numbness.  Hematological: Does not bruise/bleed easily.  Psychiatric/Behavioral: Negative for agitation and behavioral problems.    Objective:  BP 124/67   Pulse (!) 104   Temp (!) 97.4 F (36.3 C) (Oral)   Ht 5\' 4"  (1.626 m)   Wt 96 lb 6.4 oz (43.7 kg)   SpO2 93%   BMI 16.55 kg/m   BP/Weight 10/22/2018 10/15/2018 76/28/3151  Systolic BP 761 607 371  Diastolic BP 67 64 60  Wt. (Lbs) 96.4 97.8 97.4  BMI 16.55 16.79 16.72      Physical Exam Constitutional:      Appearance: She is well-developed.     Comments: Chronically ill looking  HENT:     Nose:     Comments: On 2 L of oxygen Cardiovascular:     Rate and Rhythm: Normal rate.     Heart sounds: Normal heart sounds. No murmur.  Pulmonary:     Effort: Pulmonary effort is normal.     Breath sounds: Wheezing present. No rales.     Comments: Decreased breath sounds Chest:     Chest wall: No tenderness.  Abdominal:     General: Bowel sounds are normal. There is no distension.     Palpations: Abdomen is soft. There is no mass.     Tenderness: There is no abdominal tenderness.  Musculoskeletal: Normal range of motion.  Skin:    Comments: Left middle finger with discolored nail is fluctuant and surrounding inflammation and nail cuticle.  Neurological:       Mental Status: She is alert and oriented to person, place, and time.      Assessment & Plan:   1. Chronic respiratory failure with hypoxia (HCC) Advised to increase oxygen to 3 L  2. COPD, group D, by GOLD 2017 classification ([place]) End stageI n the last 6 weeks she has been on several courses of steroids-Solu-Medrol, recently completed prednisone taper by pulmonary as well as completed a Z-Pak Advised that ongoing tobacco abuse is not helping her condition - predniSONE (DELTASONE) 20 MG tablet; Take 1 tablet (20 mg total) by  mouth 2 (two) times daily with a meal.  Dispense: 10 tablet; Refill: 0  3. TOBACCO ABUSE Spent 3 minutes counseling on cessation and she is not ready to quit  4. Early satiety - Ambulatory referral to Gastroenterology  5. Paronychia of finger of left hand - cephALEXin (KEFLEX) 500 MG capsule; Take 1 capsule (500 mg total) by mouth 2 (two) times daily.  Dispense: 20 capsule; Refill: 0  6. Decreased muscle strength Due to end-stage COPD - Ambulatory referral to [place]  7. Diabetes mellitus screening We will screen for diabetes given recent treatment with multiple courses of steroids - POCT glycosylated hemoglobin (Hb A1C)   Meds ordered this encounter  Medications  . predniSONE (DELTASONE) 20 MG tablet    Sig: Take 1 tablet (20 mg total) by mouth 2 (two) times daily with a meal.    Dispense:  10 tablet    Refill:  0  . cephALEXin (KEFLEX) 500 MG capsule    Sig: Take 1 capsule (500 mg total) by mouth 2 (two) times daily.    Dispense:  20 capsule    Refill:  0    Follow-up: Return in about 3 months (around 01/21/2019) for follow up of chronic medical conditions.   [name] MD

## 2018-10-22 NOTE — Progress Notes (Signed)
Patient has had trouble breathing for 4 days.

## 2018-10-23 ENCOUNTER — Encounter: Payer: Self-pay | Admitting: Family Medicine

## 2018-10-24 ENCOUNTER — Telehealth: Payer: Self-pay

## 2018-10-24 NOTE — Telephone Encounter (Signed)
Call received from patient.  She said that she has no preference for home health agencies.  Informed her that the agency will need to be contracted with [facility]  She also confirmed her address: [address], [place] [address]

## 2018-10-24 NOTE — Telephone Encounter (Signed)
Referral received for home PT.  Call placed to the patient # [phone] to inquire if she has a preference for home health agencies and to confirm her address. Voicemail message left requesting a call back to this CM # [phone]

## 2018-10-25 DIAGNOSIS — J9611 Chronic respiratory failure with hypoxia: Secondary | ICD-10-CM | POA: Diagnosis not present

## 2018-10-25 DIAGNOSIS — J42 Unspecified chronic bronchitis: Secondary | ICD-10-CM | POA: Diagnosis not present

## 2018-10-25 DIAGNOSIS — J439 Emphysema, unspecified: Secondary | ICD-10-CM | POA: Diagnosis not present

## 2018-10-25 DIAGNOSIS — J449 Chronic obstructive pulmonary disease, unspecified: Secondary | ICD-10-CM | POA: Diagnosis not present

## 2018-10-27 ENCOUNTER — Telehealth: Payer: Self-pay

## 2018-10-27 NOTE — Telephone Encounter (Signed)
Referral for home health PT faxed to [facility]

## 2018-10-28 ENCOUNTER — Telehealth: Payer: Self-pay | Admitting: Family Medicine

## 2018-10-28 DIAGNOSIS — E041 Nontoxic single thyroid nodule: Secondary | ICD-10-CM | POA: Diagnosis not present

## 2018-10-28 DIAGNOSIS — L03012 Cellulitis of left finger: Secondary | ICD-10-CM | POA: Diagnosis not present

## 2018-10-28 DIAGNOSIS — K5790 Diverticulosis of intestine, part unspecified, without perforation or abscess without bleeding: Secondary | ICD-10-CM | POA: Diagnosis not present

## 2018-10-28 DIAGNOSIS — J441 Chronic obstructive pulmonary disease with (acute) exacerbation: Secondary | ICD-10-CM | POA: Diagnosis not present

## 2018-10-28 DIAGNOSIS — R6881 Early satiety: Secondary | ICD-10-CM | POA: Diagnosis not present

## 2018-10-28 DIAGNOSIS — J9611 Chronic respiratory failure with hypoxia: Secondary | ICD-10-CM | POA: Diagnosis not present

## 2018-10-28 NOTE — Telephone Encounter (Signed)
physical therp evaluated patient 10/28/18 Need verbal ok  For 2x week for 7 weeks Requesting to add home health aide need assistance with bathing 1x week for 5 weeks Please follow up

## 2018-10-29 NOTE — Telephone Encounter (Signed)
Roselie Awkward was called and given verbal orders for the patient.

## 2018-10-30 ENCOUNTER — Ambulatory Visit: Payer: Medicare HMO | Admitting: Pulmonary Disease

## 2018-10-31 ENCOUNTER — Telehealth: Payer: Self-pay

## 2018-10-31 DIAGNOSIS — L03012 Cellulitis of left finger: Secondary | ICD-10-CM | POA: Diagnosis not present

## 2018-10-31 DIAGNOSIS — R6881 Early satiety: Secondary | ICD-10-CM | POA: Diagnosis not present

## 2018-10-31 DIAGNOSIS — J9611 Chronic respiratory failure with hypoxia: Secondary | ICD-10-CM | POA: Diagnosis not present

## 2018-10-31 DIAGNOSIS — K5790 Diverticulosis of intestine, part unspecified, without perforation or abscess without bleeding: Secondary | ICD-10-CM | POA: Diagnosis not present

## 2018-10-31 DIAGNOSIS — J441 Chronic obstructive pulmonary disease with (acute) exacerbation: Secondary | ICD-10-CM | POA: Diagnosis not present

## 2018-10-31 DIAGNOSIS — E041 Nontoxic single thyroid nodule: Secondary | ICD-10-CM | POA: Diagnosis not present

## 2018-10-31 NOTE — Telephone Encounter (Signed)
Call placed to [facility], spoke to [facility] who confirmed that the start of care for home health services was 10/29/2018

## 2018-11-03 ENCOUNTER — Ambulatory Visit: Payer: Medicare HMO | Admitting: Physician Assistant

## 2018-11-03 DIAGNOSIS — J9611 Chronic respiratory failure with hypoxia: Secondary | ICD-10-CM | POA: Diagnosis not present

## 2018-11-03 DIAGNOSIS — K5790 Diverticulosis of intestine, part unspecified, without perforation or abscess without bleeding: Secondary | ICD-10-CM | POA: Diagnosis not present

## 2018-11-03 DIAGNOSIS — L03012 Cellulitis of left finger: Secondary | ICD-10-CM | POA: Diagnosis not present

## 2018-11-03 DIAGNOSIS — E041 Nontoxic single thyroid nodule: Secondary | ICD-10-CM | POA: Diagnosis not present

## 2018-11-03 DIAGNOSIS — R6881 Early satiety: Secondary | ICD-10-CM | POA: Diagnosis not present

## 2018-11-03 DIAGNOSIS — J441 Chronic obstructive pulmonary disease with (acute) exacerbation: Secondary | ICD-10-CM | POA: Diagnosis not present

## 2018-11-05 ENCOUNTER — Encounter: Payer: Self-pay | Admitting: Pulmonary Disease

## 2018-11-05 ENCOUNTER — Ambulatory Visit (INDEPENDENT_AMBULATORY_CARE_PROVIDER_SITE_OTHER): Payer: Medicare HMO | Admitting: Pulmonary Disease

## 2018-11-05 DIAGNOSIS — J449 Chronic obstructive pulmonary disease, unspecified: Secondary | ICD-10-CM | POA: Diagnosis not present

## 2018-11-05 LAB — CBC WITH DIFFERENTIAL/PLATELET
Basophils Absolute: 0.1 10*3/uL (ref 0.0–0.1)
Basophils Relative: 0.9 % (ref 0.0–3.0)
EOS PCT: 2.2 % (ref 0.0–5.0)
Eosinophils Absolute: 0.2 10*3/uL (ref 0.0–0.7)
HCT: 39.3 % (ref 36.0–46.0)
Hemoglobin: 13.1 g/dL (ref 12.0–15.0)
LYMPHS ABS: 2.1 10*3/uL (ref 0.7–4.0)
Lymphocytes Relative: 20.5 % (ref 12.0–46.0)
MCHC: 33.3 g/dL (ref 30.0–36.0)
MCV: 92.8 fl (ref 78.0–100.0)
MONOS PCT: 11.1 % (ref 3.0–12.0)
Monocytes Absolute: 1.1 10*3/uL — ABNORMAL HIGH (ref 0.1–1.0)
NEUTROS PCT: 65.3 % (ref 43.0–77.0)
Neutro Abs: 6.6 10*3/uL (ref 1.4–7.7)
Platelets: 324 10*3/uL (ref 150.0–400.0)
RBC: 4.23 Mil/uL (ref 3.87–5.11)
RDW: 13.4 % (ref 11.5–15.5)
WBC: 10.1 10*3/uL (ref 4.0–10.5)

## 2018-11-05 MED ORDER — UMECLIDINIUM BROMIDE 62.5 MCG/INH IN AEPB: 1.0000 | INHALATION_SPRAY | Freq: Every day | RESPIRATORY_TRACT | 5 refills | Status: DC

## 2018-11-05 MED FILL — INCRUSE ELLIPTA 62.5 MCG IN: 62.5 | 30 days supply | Qty: 30 | Fill #0

## 2018-11-05 NOTE — Progress Notes (Signed)
KEYSI Henry    [id number]    09/25/1951  Primary Care Physician:Newlin, Charlane Ferretti, MD  Referring Physician: [name], MD [place], [place] [address]  Chief complaint:   Follow up for COPD GOLD D  HPI: 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 Her symptoms consist mainly of shortness of breath, daily cough with whitish sputum production, wheeze, dyspnea and exertion. She does not have any hemoptysis, chest pain, palpitations. No fevers, chills.  She is currently maintained on Spiriva and breo.  Tried on Trelegy inhaler in 2019 but had to go back to [place] and Spiriva since it made her breathing worse.  Also tried on Daliresp in 2019 but had to stop due to side effects of nausea  She is a current smoker. She had smoked one pack per day for nearly 25 years. She is trying to quit and is down to 1 pack every week. She had used the nicotine patch in the past. 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 She does not recall any particular exposures at work or at home.  Interim history: States that breathing is up-and-down Continues on [place].  She feels she is not able to get a deep breath for adequate delivery of medication Quit smoking on Monday.   Outpatient Encounter Medications as of 11/05/2018  Medication Sig  . acetaminophen (TYLENOL) 500 MG tablet Take 1,500 mg by mouth every 4 (four) hours as needed. For headache  . albuterol (PROVENTIL) (2.5 MG/3ML) 0.083% nebulizer solution TAKE 3 MLS (2.5 MG TOTAL) BY NEBULIZATION EVERY 6 (SIX) HOURS AS NEEDED FOR WHEEZING OR SHORTNESS OF BREATH.  Marland Kitchen aspirin EC 81 MG tablet Take 1 tablet (81 mg total) by mouth daily.  . fluticasone (FLONASE) 50 MCG/ACT nasal spray Place 2 sprays into both nostrils daily.  . fluticasone furoate-vilanterol (BREO ELLIPTA) 100-25 MCG/INH AEPB Inhale 1 puff into the lungs daily.  Marland Kitchen  loratadine (CLARITIN) 10 MG tablet TAKE 1 TABLET (10 MG TOTAL) BY MOUTH DAILY.  Marland Kitchen OXYGEN Inhale 2 L into the lungs.  . tiotropium (SPIRIVA) 18 MCG inhalation capsule Place 1 capsule (18 mcg total) into inhaler and inhale daily.  . VENTOLIN HFA 108 (90 Base) MCG/ACT inhaler INHALE 2 PUFFS BY MOUTH INTO THE LUNGS EVERY 4 (FOUR) HOURS AS NEEDED FOR WHEEZING OR SHORTNESS OF BREATH.  . [DISCONTINUED] cephALEXin (KEFLEX) 500 MG capsule Take 1 capsule (500 mg total) by mouth 2 (two) times daily.  . [DISCONTINUED] predniSONE (DELTASONE) 20 MG tablet Take 1 tablet (20 mg total) by mouth 2 (two) times daily with a meal.  . [DISCONTINUED] ranitidine (ZANTAC) 300 MG tablet Take 1 tablet (300 mg total) by mouth at bedtime. (30 mins after supper)   No facility-administered encounter medications on file as of 11/05/2018.    Physical Exam: Blood pressure (!) 130/58, pulse 88, height 5\' 4"  (1.626 m), weight 96 lb 6.4 oz (43.7 kg), SpO2 90 %. Gen:      No acute distress HEENT:  EOMI, sclera anicteric Neck:     No masses; no thyromegaly Lungs:    Diminished air entry, no wheeze CV:         Regular rate and rhythm; no murmurs Abd:      + bowel sounds; soft, non-tender; no palpable masses, no distension Ext:    No edema; adequate peripheral perfusion  Skin:      Warm and dry; no rash Neuro: alert and oriented x 3 Psych: normal mood and affect  Data Reviewed: Screening CT chest 03/07/18- calcified right thyroid nodule.  Severe centrilobular emphysema, bronchial wall thickening.  Previously described 4.8 mm right lower lobe nodule now measures 2.9 mm.  Granulomatous splenic and liver calcification. I reviewed the images personally.  PFTs (07/21/15) FVC 2.06 66%), FEV1 0.93 (39%), F/F 45, DLCO 36% Unable complete pleth. Severe obstructive lung disease, severe diffusion impairment  Spirometry 04/12/16 FVC 1.79 [5 7%), FEV1 0.65 (27%), F/F 36 Very severe obstructive airway disease  Assessment:  Severe  COPD,Gold Stage D, Chronic bronchitis Continue Breo.  She is not able to generate enough peak inspiratory flow with the Spiriva We will stop the Spiriva and switch her to Incruse Continue supplemental oxygen Discussed pulmonary rehab but she is already getting physical therapy at home twice a week.  We will check some baseline labs today including CBC with differential, IgE, alpha-1 antitrypsin levels and phenotype  Pulmonary nodule Smaller on follow-up CT.  Continue annual low-dose screening CT of the chest.  Current smoker. Quit smoking 2 days ago.  Congratulated her and advised her not to pick up the habit again.  Plan/Recommendations: - Continue Breo - Stop Spiriva, start Incruse - CBC with differential, IgE, alpha-1 antitrypsin levels  Marshell Garfinkel MD [facility] 11/05/2018, 1:36 PM  CC: [name], MD

## 2018-11-05 NOTE — Patient Instructions (Signed)
We will stop the Spiriva and start you on a different inhaler called Incruse Continue the Breo inhaler Congratulations on quitting smoking.  Sure you do not pick up the habit again Continue supplemental oxygen Follow-up in 3 months.

## 2018-11-05 NOTE — Progress Notes (Signed)
Inhaler training given. In-check peak flow #:55 Breo   Inhaler training given. In-check peak flow #:35 spiriva.

## 2018-11-05 NOTE — Addendum Note (Signed)
Addended by: Suzzanne Cloud E on: 11/05/2018 02:02 PM   Modules accepted: Orders

## 2018-11-06 DIAGNOSIS — R6881 Early satiety: Secondary | ICD-10-CM | POA: Diagnosis not present

## 2018-11-06 DIAGNOSIS — E041 Nontoxic single thyroid nodule: Secondary | ICD-10-CM | POA: Diagnosis not present

## 2018-11-06 DIAGNOSIS — L03012 Cellulitis of left finger: Secondary | ICD-10-CM | POA: Diagnosis not present

## 2018-11-06 DIAGNOSIS — K5790 Diverticulosis of intestine, part unspecified, without perforation or abscess without bleeding: Secondary | ICD-10-CM | POA: Diagnosis not present

## 2018-11-06 DIAGNOSIS — J441 Chronic obstructive pulmonary disease with (acute) exacerbation: Secondary | ICD-10-CM | POA: Diagnosis not present

## 2018-11-06 DIAGNOSIS — J9611 Chronic respiratory failure with hypoxia: Secondary | ICD-10-CM | POA: Diagnosis not present

## 2018-11-07 MED FILL — ALBUTEROL 0.083% INHAL SOLN: (2.5 MG/3ML | 7 days supply | Qty: 75 | Fill #1

## 2018-11-07 MED FILL — BREO ELLIPTA 100-25 MCG INH: 100-25 | 30 days supply | Qty: 60 | Fill #3

## 2018-11-07 MED FILL — VENTOLIN HFA 90 MCG INHALER: 108 (90 BAS | 17 days supply | Qty: 18 | Fill #2

## 2018-11-10 DIAGNOSIS — R6881 Early satiety: Secondary | ICD-10-CM | POA: Diagnosis not present

## 2018-11-10 DIAGNOSIS — E041 Nontoxic single thyroid nodule: Secondary | ICD-10-CM | POA: Diagnosis not present

## 2018-11-10 DIAGNOSIS — L03012 Cellulitis of left finger: Secondary | ICD-10-CM | POA: Diagnosis not present

## 2018-11-10 DIAGNOSIS — J9611 Chronic respiratory failure with hypoxia: Secondary | ICD-10-CM | POA: Diagnosis not present

## 2018-11-10 DIAGNOSIS — K5790 Diverticulosis of intestine, part unspecified, without perforation or abscess without bleeding: Secondary | ICD-10-CM | POA: Diagnosis not present

## 2018-11-10 DIAGNOSIS — J441 Chronic obstructive pulmonary disease with (acute) exacerbation: Secondary | ICD-10-CM | POA: Diagnosis not present

## 2018-11-13 ENCOUNTER — Telehealth: Payer: Self-pay | Admitting: Pulmonary Disease

## 2018-11-13 DIAGNOSIS — L03012 Cellulitis of left finger: Secondary | ICD-10-CM | POA: Diagnosis not present

## 2018-11-13 DIAGNOSIS — R6881 Early satiety: Secondary | ICD-10-CM | POA: Diagnosis not present

## 2018-11-13 DIAGNOSIS — J9611 Chronic respiratory failure with hypoxia: Secondary | ICD-10-CM | POA: Diagnosis not present

## 2018-11-13 DIAGNOSIS — J441 Chronic obstructive pulmonary disease with (acute) exacerbation: Secondary | ICD-10-CM | POA: Diagnosis not present

## 2018-11-13 DIAGNOSIS — E041 Nontoxic single thyroid nodule: Secondary | ICD-10-CM | POA: Diagnosis not present

## 2018-11-13 DIAGNOSIS — K5790 Diverticulosis of intestine, part unspecified, without perforation or abscess without bleeding: Secondary | ICD-10-CM | POA: Diagnosis not present

## 2018-11-13 LAB — IGE: IgE (Immunoglobulin E), Serum: 31 kU/L (ref <=114)

## 2018-11-13 LAB — ALPHA-1 ANTITRYPSIN PHENOTYPE: A-1 Antitrypsin, Ser: 171 mg/dL (ref 83–199)

## 2018-11-13 NOTE — Telephone Encounter (Signed)
Per last office note patient does not have the inspiratory flow to utilize the Spiriva HandiHaler that she was using.  Patient could be placed on Spiriva Respimat 2.5 instead of incruse.  I would recommend that if the patient does want to trial Spiriva Respimat 2.5 that she present to our office to be instructed on how to use that inhaler correctly as this would be a new inhaler device.  If we have a sample that this can be made available for the patient.  If the patient would like to proceed forward with this plan:  Spiriva Respimat 2.5 >>> 2 puffs daily >>> Do this every day >>>This is not a rescue inhaler  This would replace Incruse Ellipta.   Wyn Quaker FNP

## 2018-11-13 NOTE — Telephone Encounter (Signed)
lmom for patient will call back

## 2018-11-13 NOTE — Telephone Encounter (Signed)
Patient called back she stated she does not know when she will be able to come in to get the teaching on the inhaler or the sample she will come when it is good for her. Nothing further needed.

## 2018-11-13 NOTE — Telephone Encounter (Signed)
Primary Pulmonologist: Mannam Last office visit and with whom: 11/05/17 By [facility] What do we see them for (pulmonary problems): COPD Last OV assessment/plan: We will stop the Spiriva and start you on a different inhaler called Incruse Continue the Breo inhaler Congratulations on quitting smoking.  Sure you do not pick up the habit again Continue supplemental oxygen Follow-up in 3 months.     Was appointment offered to patient (explain)? Margaret Henry says he doesn't believe she needs a apt at this time   Reason for call: Margaret Henry states that the patient is saying she is more sob breath since discontinuing the Spiriva and starting the Incruse. She ask to start the Spiriva back or to be put on something else.  Margaret Henry ask that we let Margaret Henry  What BM recommendations are.   BM please advise

## 2018-11-17 DIAGNOSIS — K5790 Diverticulosis of intestine, part unspecified, without perforation or abscess without bleeding: Secondary | ICD-10-CM | POA: Diagnosis not present

## 2018-11-17 DIAGNOSIS — E041 Nontoxic single thyroid nodule: Secondary | ICD-10-CM | POA: Diagnosis not present

## 2018-11-17 DIAGNOSIS — R6881 Early satiety: Secondary | ICD-10-CM | POA: Diagnosis not present

## 2018-11-17 DIAGNOSIS — J9611 Chronic respiratory failure with hypoxia: Secondary | ICD-10-CM | POA: Diagnosis not present

## 2018-11-17 DIAGNOSIS — J441 Chronic obstructive pulmonary disease with (acute) exacerbation: Secondary | ICD-10-CM | POA: Diagnosis not present

## 2018-11-17 DIAGNOSIS — L03012 Cellulitis of left finger: Secondary | ICD-10-CM | POA: Diagnosis not present

## 2018-11-19 DIAGNOSIS — R6881 Early satiety: Secondary | ICD-10-CM | POA: Diagnosis not present

## 2018-11-19 DIAGNOSIS — K5790 Diverticulosis of intestine, part unspecified, without perforation or abscess without bleeding: Secondary | ICD-10-CM | POA: Diagnosis not present

## 2018-11-19 DIAGNOSIS — E041 Nontoxic single thyroid nodule: Secondary | ICD-10-CM | POA: Diagnosis not present

## 2018-11-19 DIAGNOSIS — J441 Chronic obstructive pulmonary disease with (acute) exacerbation: Secondary | ICD-10-CM | POA: Diagnosis not present

## 2018-11-19 DIAGNOSIS — L03012 Cellulitis of left finger: Secondary | ICD-10-CM | POA: Diagnosis not present

## 2018-11-19 DIAGNOSIS — J9611 Chronic respiratory failure with hypoxia: Secondary | ICD-10-CM | POA: Diagnosis not present

## 2018-11-20 ENCOUNTER — Ambulatory Visit: Payer: Medicare HMO | Admitting: Gastroenterology

## 2018-11-20 ENCOUNTER — Telehealth: Payer: Self-pay | Admitting: Family Medicine

## 2018-11-20 DIAGNOSIS — J449 Chronic obstructive pulmonary disease, unspecified: Secondary | ICD-10-CM

## 2018-11-20 NOTE — Telephone Encounter (Signed)
Will route to PCP review.

## 2018-11-20 NOTE — Telephone Encounter (Signed)
New Message   Pt states she needs a prescription faxed to [facility] for a nebulizer machine because hers broke yesterday

## 2018-11-21 NOTE — Telephone Encounter (Signed)
Done

## 2018-11-24 ENCOUNTER — Telehealth: Payer: Self-pay | Admitting: *Deleted

## 2018-11-24 NOTE — Telephone Encounter (Signed)
Claiborne Billings RN from after hours call center called to inform that [name] called office at [phone] requesting a new order for nebulizer machine be faxed to [facility].  Fax number : [phone] Phone number- [phone]

## 2018-11-24 NOTE — Telephone Encounter (Signed)
Patient called to check on the status of the nebulizer machine. Patient was informed that paperwork will be faxed to fax number [phone]

## 2018-11-24 NOTE — Telephone Encounter (Signed)
Script has been faxed over

## 2018-11-24 NOTE — Telephone Encounter (Signed)
This has been faxed over.

## 2018-11-25 ENCOUNTER — Other Ambulatory Visit: Payer: Self-pay | Admitting: Family Medicine

## 2018-11-25 DIAGNOSIS — E041 Nontoxic single thyroid nodule: Secondary | ICD-10-CM | POA: Diagnosis not present

## 2018-11-25 DIAGNOSIS — J449 Chronic obstructive pulmonary disease, unspecified: Secondary | ICD-10-CM | POA: Diagnosis not present

## 2018-11-25 DIAGNOSIS — L03012 Cellulitis of left finger: Secondary | ICD-10-CM | POA: Diagnosis not present

## 2018-11-25 DIAGNOSIS — K5790 Diverticulosis of intestine, part unspecified, without perforation or abscess without bleeding: Secondary | ICD-10-CM | POA: Diagnosis not present

## 2018-11-25 DIAGNOSIS — R6881 Early satiety: Secondary | ICD-10-CM | POA: Diagnosis not present

## 2018-11-25 DIAGNOSIS — J9611 Chronic respiratory failure with hypoxia: Secondary | ICD-10-CM | POA: Diagnosis not present

## 2018-11-25 DIAGNOSIS — J441 Chronic obstructive pulmonary disease with (acute) exacerbation: Secondary | ICD-10-CM | POA: Diagnosis not present

## 2018-11-25 DIAGNOSIS — J439 Emphysema, unspecified: Secondary | ICD-10-CM | POA: Diagnosis not present

## 2018-11-25 DIAGNOSIS — J42 Unspecified chronic bronchitis: Secondary | ICD-10-CM | POA: Diagnosis not present

## 2018-11-25 MED FILL — FLUTICASONE PROP 50 MCG SPR: 50 | 30 days supply | Qty: 16 | Fill #3

## 2018-11-25 MED FILL — ALBUTEROL 0.083% INHAL SOLN: (2.5 MG/3ML | 7 days supply | Qty: 75 | Fill #2

## 2018-11-26 DIAGNOSIS — F17200 Nicotine dependence, unspecified, uncomplicated: Secondary | ICD-10-CM | POA: Diagnosis not present

## 2018-11-26 DIAGNOSIS — J9611 Chronic respiratory failure with hypoxia: Secondary | ICD-10-CM | POA: Diagnosis not present

## 2018-11-26 DIAGNOSIS — J449 Chronic obstructive pulmonary disease, unspecified: Secondary | ICD-10-CM | POA: Diagnosis not present

## 2018-11-26 DIAGNOSIS — J42 Unspecified chronic bronchitis: Secondary | ICD-10-CM | POA: Diagnosis not present

## 2018-11-26 DIAGNOSIS — J439 Emphysema, unspecified: Secondary | ICD-10-CM | POA: Diagnosis not present

## 2018-11-26 MED FILL — VENTOLIN HFA 90 MCG INHALER: 108 (90 BAS | 17 days supply | Qty: 18 | Fill #0

## 2018-11-27 NOTE — Telephone Encounter (Signed)
Late Entry- Pt contacted on 11/26/2018 at 1410.   Informed that formes were faxed already. Verbalized understanding.

## 2018-11-28 DIAGNOSIS — J441 Chronic obstructive pulmonary disease with (acute) exacerbation: Secondary | ICD-10-CM | POA: Diagnosis not present

## 2018-11-28 DIAGNOSIS — R6881 Early satiety: Secondary | ICD-10-CM | POA: Diagnosis not present

## 2018-11-28 DIAGNOSIS — E041 Nontoxic single thyroid nodule: Secondary | ICD-10-CM | POA: Diagnosis not present

## 2018-11-28 DIAGNOSIS — K5790 Diverticulosis of intestine, part unspecified, without perforation or abscess without bleeding: Secondary | ICD-10-CM | POA: Diagnosis not present

## 2018-11-28 DIAGNOSIS — L03012 Cellulitis of left finger: Secondary | ICD-10-CM | POA: Diagnosis not present

## 2018-11-28 DIAGNOSIS — J9611 Chronic respiratory failure with hypoxia: Secondary | ICD-10-CM | POA: Diagnosis not present

## 2018-12-01 DIAGNOSIS — K5790 Diverticulosis of intestine, part unspecified, without perforation or abscess without bleeding: Secondary | ICD-10-CM | POA: Diagnosis not present

## 2018-12-01 DIAGNOSIS — J441 Chronic obstructive pulmonary disease with (acute) exacerbation: Secondary | ICD-10-CM | POA: Diagnosis not present

## 2018-12-01 DIAGNOSIS — R6881 Early satiety: Secondary | ICD-10-CM | POA: Diagnosis not present

## 2018-12-01 DIAGNOSIS — J9611 Chronic respiratory failure with hypoxia: Secondary | ICD-10-CM | POA: Diagnosis not present

## 2018-12-01 DIAGNOSIS — E041 Nontoxic single thyroid nodule: Secondary | ICD-10-CM | POA: Diagnosis not present

## 2018-12-01 DIAGNOSIS — L03012 Cellulitis of left finger: Secondary | ICD-10-CM | POA: Diagnosis not present

## 2018-12-02 ENCOUNTER — Telehealth: Payer: Self-pay | Admitting: Family Medicine

## 2018-12-02 ENCOUNTER — Inpatient Hospital Stay (HOSPITAL_COMMUNITY)
Admission: EM | Admit: 2018-12-02 | Discharge: 2018-12-04 | DRG: 190 | Disposition: A | Discharge status: Home / Self Care | Payer: Medicare HMO | Attending: Internal Medicine | Admitting: Internal Medicine

## 2018-12-02 ENCOUNTER — Other Ambulatory Visit: Payer: Self-pay

## 2018-12-02 ENCOUNTER — Encounter (HOSPITAL_COMMUNITY): Payer: Self-pay | Admitting: Emergency Medicine

## 2018-12-02 ENCOUNTER — Emergency Department (HOSPITAL_COMMUNITY): Payer: Medicare HMO

## 2018-12-02 DIAGNOSIS — J9621 Acute and chronic respiratory failure with hypoxia: Secondary | ICD-10-CM | POA: Diagnosis present

## 2018-12-02 DIAGNOSIS — D649 Anemia, unspecified: Secondary | ICD-10-CM | POA: Diagnosis present

## 2018-12-02 DIAGNOSIS — I2510 Atherosclerotic heart disease of native coronary artery without angina pectoris: Secondary | ICD-10-CM | POA: Diagnosis present

## 2018-12-02 DIAGNOSIS — J9601 Acute respiratory failure with hypoxia: Secondary | ICD-10-CM | POA: Diagnosis not present

## 2018-12-02 DIAGNOSIS — M810 Age-related osteoporosis without current pathological fracture: Secondary | ICD-10-CM | POA: Diagnosis present

## 2018-12-02 DIAGNOSIS — Z9981 Dependence on supplemental oxygen: Secondary | ICD-10-CM

## 2018-12-02 DIAGNOSIS — R000 Tachycardia, unspecified: Secondary | ICD-10-CM | POA: Diagnosis not present

## 2018-12-02 DIAGNOSIS — Z885 Allergy status to narcotic agent status: Secondary | ICD-10-CM | POA: Diagnosis not present

## 2018-12-02 DIAGNOSIS — Z20828 Contact with and (suspected) exposure to other viral communicable diseases: Secondary | ICD-10-CM | POA: Diagnosis present

## 2018-12-02 DIAGNOSIS — J9622 Acute and chronic respiratory failure with hypercapnia: Secondary | ICD-10-CM | POA: Diagnosis present

## 2018-12-02 DIAGNOSIS — Z79899 Other long term (current) drug therapy: Secondary | ICD-10-CM

## 2018-12-02 DIAGNOSIS — Z825 Family history of asthma and other chronic lower respiratory diseases: Secondary | ICD-10-CM

## 2018-12-02 DIAGNOSIS — Z7982 Long term (current) use of aspirin: Secondary | ICD-10-CM | POA: Diagnosis not present

## 2018-12-02 DIAGNOSIS — E785 Hyperlipidemia, unspecified: Secondary | ICD-10-CM | POA: Diagnosis present

## 2018-12-02 DIAGNOSIS — I10 Essential (primary) hypertension: Secondary | ICD-10-CM | POA: Diagnosis present

## 2018-12-02 DIAGNOSIS — E782 Mixed hyperlipidemia: Secondary | ICD-10-CM | POA: Diagnosis not present

## 2018-12-02 DIAGNOSIS — F17211 Nicotine dependence, cigarettes, in remission: Secondary | ICD-10-CM | POA: Diagnosis present

## 2018-12-02 DIAGNOSIS — J441 Chronic obstructive pulmonary disease with (acute) exacerbation: Secondary | ICD-10-CM | POA: Diagnosis present

## 2018-12-02 DIAGNOSIS — J42 Unspecified chronic bronchitis: Secondary | ICD-10-CM | POA: Diagnosis not present

## 2018-12-02 DIAGNOSIS — R0689 Other abnormalities of breathing: Secondary | ICD-10-CM | POA: Diagnosis not present

## 2018-12-02 DIAGNOSIS — Z800 Family history of malignant neoplasm of digestive organs: Secondary | ICD-10-CM | POA: Diagnosis not present

## 2018-12-02 DIAGNOSIS — Z801 Family history of malignant neoplasm of trachea, bronchus and lung: Secondary | ICD-10-CM | POA: Diagnosis not present

## 2018-12-02 DIAGNOSIS — Z8249 Family history of ischemic heart disease and other diseases of the circulatory system: Secondary | ICD-10-CM | POA: Diagnosis not present

## 2018-12-02 DIAGNOSIS — Z7951 Long term (current) use of inhaled steroids: Secondary | ICD-10-CM

## 2018-12-02 DIAGNOSIS — R0602 Shortness of breath: Secondary | ICD-10-CM | POA: Diagnosis present

## 2018-12-02 LAB — COMPREHENSIVE METABOLIC PANEL
ALT: 12 U/L (ref 0–44)
AST: 16 U/L (ref 15–41)
Albumin: 4.3 g/dL (ref 3.5–5.0)
Alkaline Phosphatase: 53 U/L (ref 38–126)
Anion gap: 9 (ref 5–15)
BUN: 8 mg/dL (ref 8–23)
CHLORIDE: 106 mmol/L (ref 98–111)
CO2: 25 mmol/L (ref 22–32)
Calcium: 8.8 mg/dL — ABNORMAL LOW (ref 8.9–10.3)
Creatinine, Ser: 0.5 mg/dL (ref 0.44–1.00)
GFR calc Af Amer: >60 mL/min (ref >60)
GFR calc non Af Amer: >60 mL/min (ref >60)
Glucose, Bld: 163 mg/dL — ABNORMAL HIGH (ref 70–99)
Potassium: 3.8 mmol/L (ref 3.5–5.1)
Sodium: 140 mmol/L (ref 135–145)
Total Bilirubin: 0.4 mg/dL (ref 0.3–1.2)
Total Protein: 7.1 g/dL (ref 6.5–8.1)

## 2018-12-02 LAB — CBC WITH DIFFERENTIAL/PLATELET
Abs Immature Granulocytes: 0.04 10*3/uL (ref 0.00–0.07)
BASOS ABS: 0.1 10*3/uL (ref 0.0–0.1)
Basophils Relative: 1 %
EOS PCT: 3 %
Eosinophils Absolute: 0.3 10*3/uL (ref 0.0–0.5)
HCT: 37.6 % (ref 36.0–46.0)
Hemoglobin: 11.9 g/dL — ABNORMAL LOW (ref 12.0–15.0)
Immature Granulocytes: 0 %
LYMPHS PCT: 34 %
Lymphs Abs: 3.2 10*3/uL (ref 0.7–4.0)
MCH: 30.9 pg (ref 26.0–34.0)
MCHC: 31.6 g/dL (ref 30.0–36.0)
MCV: 97.7 fL (ref 80.0–100.0)
Monocytes Absolute: 1 10*3/uL (ref 0.1–1.0)
Monocytes Relative: 11 %
NRBC: 0 % (ref 0.0–0.2)
Neutro Abs: 4.6 10*3/uL (ref 1.7–7.7)
Neutrophils Relative %: 51 %
Platelets: 344 10*3/uL (ref 150–400)
RBC: 3.85 MIL/uL — ABNORMAL LOW (ref 3.87–5.11)
RDW: 12.8 % (ref 11.5–15.5)
WBC: 9.2 10*3/uL (ref 4.0–10.5)

## 2018-12-02 LAB — TROPONIN I: Troponin I: <0.03 ng/mL (ref <0.03)

## 2018-12-02 MED ORDER — ALBUTEROL (5 MG/ML) CONTINUOUS INHALATION SOLN
10.0000 mg/h | INHALATION_SOLUTION | Freq: Once | RESPIRATORY_TRACT | Status: AC
  Administered 2018-12-02: 10 mg/h via RESPIRATORY_TRACT
  Filled 2018-12-02: qty 20

## 2018-12-02 NOTE — ED Notes (Signed)
Bed: RESA Expected date:  Expected time:  Means of arrival:  Comments: COPD on cpap

## 2018-12-02 NOTE — Telephone Encounter (Addendum)
Patient called because she is getting over the flu and would like to be called in an antibiotic to help with the congestion in her chest due to the flu. She is not sure what she can do for the congestion in her chest.  Please follow up.  Advised to go to the ED or UC

## 2018-12-02 NOTE — ED Triage Notes (Signed)
Pt from home.  Hx of COPD.  Called out for [facility]. Fire put her on 6L 02 with no relief.  Pt now on CPAP and states that she feels better.  Pt sitting up and tripoding.

## 2018-12-02 NOTE — Telephone Encounter (Signed)
I do not see when she was evaluated for the flu in her chart however if she has respiratory or flulike symptoms she would need to be evaluated.

## 2018-12-02 NOTE — Telephone Encounter (Signed)
Patients morning call was returned with no answer.  Nurse asked patient to call back to discuss her condern.

## 2018-12-02 NOTE — Telephone Encounter (Signed)
Patient called back from Nursings message. Patient states that she is having congestion for several days.  States she is getting over the flu. Patient was wanting Dr. Margarita Rana to prescribe an antibiotic and call it in.  Patient was told that she needed to make an appointment to see Dr. Margarita Rana    Patient stated that she could get out"because I am getting over the flu."  Patient was reminded that Dr Margarita Rana needed to see her in order to get medication.Patient then said that she had to hang up due to her phone running out of battery time.  Patient was advised that she could come to the clinic at 0830 and wait for the first No Show appointment.  Patient states she could not come that early as she had to complete her breathing treatments which took until 0900 hrs.  Patient told to call back when her phone was charged and schedule and appointment.   Nurse plans to call patient back later in the afternoon and verify appointment and tell her she could come after her breathing treatments and wait for the first available no show appointment.

## 2018-12-03 ENCOUNTER — Encounter (HOSPITAL_COMMUNITY): Payer: Self-pay | Admitting: Internal Medicine

## 2018-12-03 DIAGNOSIS — Z8249 Family history of ischemic heart disease and other diseases of the circulatory system: Secondary | ICD-10-CM | POA: Diagnosis not present

## 2018-12-03 DIAGNOSIS — Z801 Family history of malignant neoplasm of trachea, bronchus and lung: Secondary | ICD-10-CM | POA: Diagnosis not present

## 2018-12-03 DIAGNOSIS — Z7982 Long term (current) use of aspirin: Secondary | ICD-10-CM | POA: Diagnosis not present

## 2018-12-03 DIAGNOSIS — J9601 Acute respiratory failure with hypoxia: Secondary | ICD-10-CM | POA: Diagnosis present

## 2018-12-03 DIAGNOSIS — J441 Chronic obstructive pulmonary disease with (acute) exacerbation: Secondary | ICD-10-CM | POA: Diagnosis present

## 2018-12-03 DIAGNOSIS — F17211 Nicotine dependence, cigarettes, in remission: Secondary | ICD-10-CM | POA: Diagnosis present

## 2018-12-03 DIAGNOSIS — Z800 Family history of malignant neoplasm of digestive organs: Secondary | ICD-10-CM | POA: Diagnosis not present

## 2018-12-03 DIAGNOSIS — J9622 Acute and chronic respiratory failure with hypercapnia: Secondary | ICD-10-CM | POA: Diagnosis present

## 2018-12-03 DIAGNOSIS — Z885 Allergy status to narcotic agent status: Secondary | ICD-10-CM | POA: Diagnosis not present

## 2018-12-03 DIAGNOSIS — Z79899 Other long term (current) drug therapy: Secondary | ICD-10-CM | POA: Diagnosis not present

## 2018-12-03 DIAGNOSIS — M810 Age-related osteoporosis without current pathological fracture: Secondary | ICD-10-CM | POA: Diagnosis present

## 2018-12-03 DIAGNOSIS — R0602 Shortness of breath: Secondary | ICD-10-CM | POA: Diagnosis present

## 2018-12-03 DIAGNOSIS — J9621 Acute and chronic respiratory failure with hypoxia: Secondary | ICD-10-CM | POA: Diagnosis present

## 2018-12-03 DIAGNOSIS — I2510 Atherosclerotic heart disease of native coronary artery without angina pectoris: Secondary | ICD-10-CM | POA: Diagnosis present

## 2018-12-03 DIAGNOSIS — Z20828 Contact with and (suspected) exposure to other viral communicable diseases: Secondary | ICD-10-CM | POA: Diagnosis present

## 2018-12-03 DIAGNOSIS — Z825 Family history of asthma and other chronic lower respiratory diseases: Secondary | ICD-10-CM | POA: Diagnosis not present

## 2018-12-03 DIAGNOSIS — J42 Unspecified chronic bronchitis: Secondary | ICD-10-CM | POA: Diagnosis not present

## 2018-12-03 DIAGNOSIS — D649 Anemia, unspecified: Secondary | ICD-10-CM | POA: Diagnosis present

## 2018-12-03 DIAGNOSIS — E782 Mixed hyperlipidemia: Secondary | ICD-10-CM | POA: Diagnosis not present

## 2018-12-03 DIAGNOSIS — Z9981 Dependence on supplemental oxygen: Secondary | ICD-10-CM | POA: Diagnosis not present

## 2018-12-03 DIAGNOSIS — E785 Hyperlipidemia, unspecified: Secondary | ICD-10-CM | POA: Diagnosis present

## 2018-12-03 DIAGNOSIS — Z7951 Long term (current) use of inhaled steroids: Secondary | ICD-10-CM | POA: Diagnosis not present

## 2018-12-03 DIAGNOSIS — I10 Essential (primary) hypertension: Secondary | ICD-10-CM | POA: Diagnosis present

## 2018-12-03 LAB — BRAIN NATRIURETIC PEPTIDE: B Natriuretic Peptide: 28.1 pg/mL (ref 0.0–100.0)

## 2018-12-03 LAB — RESPIRATORY PANEL BY PCR
ADENOVIRUS-RVPPCR: NOT DETECTED
Bordetella pertussis: NOT DETECTED
CHLAMYDOPHILA PNEUMONIAE-RVPPCR: NOT DETECTED
CORONAVIRUS NL63-RVPPCR: NOT DETECTED
Coronavirus 229E: NOT DETECTED
Coronavirus HKU1: NOT DETECTED
Coronavirus OC43: NOT DETECTED
Influenza A: NOT DETECTED
Influenza B: NOT DETECTED
Metapneumovirus: NOT DETECTED
Mycoplasma pneumoniae: NOT DETECTED
Parainfluenza Virus 1: NOT DETECTED
Parainfluenza Virus 2: NOT DETECTED
Parainfluenza Virus 3: NOT DETECTED
Parainfluenza Virus 4: NOT DETECTED
Respiratory Syncytial Virus: NOT DETECTED
Rhinovirus / Enterovirus: NOT DETECTED

## 2018-12-03 LAB — CBC WITH DIFFERENTIAL/PLATELET
Abs Immature Granulocytes: 0.06 10*3/uL (ref 0.00–0.07)
Basophils Absolute: 0 10*3/uL (ref 0.0–0.1)
Basophils Relative: 0 %
EOS PCT: 0 %
Eosinophils Absolute: 0 10*3/uL (ref 0.0–0.5)
HCT: 34.2 % — ABNORMAL LOW (ref 36.0–46.0)
Hemoglobin: 10.8 g/dL — ABNORMAL LOW (ref 12.0–15.0)
Immature Granulocytes: 1 %
Lymphocytes Relative: 7 %
Lymphs Abs: 0.6 10*3/uL — ABNORMAL LOW (ref 0.7–4.0)
MCH: 30.4 pg (ref 26.0–34.0)
MCHC: 31.6 g/dL (ref 30.0–36.0)
MCV: 96.3 fL (ref 80.0–100.0)
Monocytes Absolute: 0.1 10*3/uL (ref 0.1–1.0)
Monocytes Relative: 1 %
Neutro Abs: 8.4 10*3/uL — ABNORMAL HIGH (ref 1.7–7.7)
Neutrophils Relative %: 91 %
Platelets: 315 10*3/uL (ref 150–400)
RBC: 3.55 MIL/uL — ABNORMAL LOW (ref 3.87–5.11)
RDW: 12.6 % (ref 11.5–15.5)
WBC: 9.2 10*3/uL (ref 4.0–10.5)
nRBC: 0 % (ref 0.0–0.2)

## 2018-12-03 LAB — COMPREHENSIVE METABOLIC PANEL
ALT: 11 U/L (ref 0–44)
AST: 18 U/L (ref 15–41)
Albumin: 3.9 g/dL (ref 3.5–5.0)
Alkaline Phosphatase: 50 U/L (ref 38–126)
Anion gap: 8 (ref 5–15)
BUN: 10 mg/dL (ref 8–23)
CO2: 25 mmol/L (ref 22–32)
CREATININE: 0.52 mg/dL (ref 0.44–1.00)
Calcium: 8.8 mg/dL — ABNORMAL LOW (ref 8.9–10.3)
Chloride: 107 mmol/L (ref 98–111)
GFR calc Af Amer: >60 mL/min (ref >60)
Glucose, Bld: 150 mg/dL — ABNORMAL HIGH (ref 70–99)
Potassium: 3.7 mmol/L (ref 3.5–5.1)
Sodium: 140 mmol/L (ref 135–145)
Total Bilirubin: 0.4 mg/dL (ref 0.3–1.2)
Total Protein: 6.5 g/dL (ref 6.5–8.1)

## 2018-12-03 LAB — BLOOD GAS, ARTERIAL
Acid-base deficit: 0.4 mmol/L (ref 0.0–2.0)
Bicarbonate: 25.2 mmol/L (ref 20.0–28.0)
DRAWN BY: 232811
Delivery systems: POSITIVE
Expiratory PAP: 5
FIO2: 30
Inspiratory PAP: 10
O2 Saturation: 96.8 %
Patient temperature: 98.6
pCO2 arterial: 48.4 mmHg — ABNORMAL HIGH (ref 32.0–48.0)
pH, Arterial: 7.336 — ABNORMAL LOW (ref 7.350–7.450)
pO2, Arterial: 93.5 mmHg (ref 83.0–108.0)

## 2018-12-03 LAB — TROPONIN I: Troponin I: <0.03 ng/mL (ref <0.03)

## 2018-12-03 LAB — IRON AND TIBC
Iron: 49 ug/dL (ref 28–170)
Saturation Ratios: 16 % (ref 10.4–31.8)
TIBC: 308 ug/dL (ref 250–450)
UIBC: 259 ug/dL

## 2018-12-03 LAB — RETICULOCYTES
Immature Retic Fract: 7.5 % (ref 2.3–15.9)
RBC.: 3.6 MIL/uL — ABNORMAL LOW (ref 3.87–5.11)
Retic Count, Absolute: 32.8 10*3/uL (ref 19.0–186.0)
Retic Ct Pct: 0.9 % (ref 0.4–3.1)

## 2018-12-03 LAB — HEMOGLOBIN A1C
HEMOGLOBIN A1C: 5.4 % (ref 4.8–5.6)
Mean Plasma Glucose: 108.28 mg/dL

## 2018-12-03 LAB — FOLATE: Folate: 10.6 ng/mL (ref >5.9)

## 2018-12-03 LAB — INFLUENZA PANEL BY PCR (TYPE A & B)
Influenza A By PCR: NEGATIVE
Influenza B By PCR: NEGATIVE

## 2018-12-03 LAB — VITAMIN B12: Vitamin B-12: 299 pg/mL (ref 180–914)

## 2018-12-03 LAB — HIV ANTIBODY (ROUTINE TESTING W REFLEX): HIV Screen 4th Generation wRfx: NONREACTIVE

## 2018-12-03 LAB — FERRITIN: Ferritin: 30 ng/mL (ref 11–307)

## 2018-12-03 LAB — MRSA PCR SCREENING: MRSA BY PCR: NEGATIVE

## 2018-12-03 MED ORDER — LORATADINE 10 MG PO TABS
10.0000 mg | ORAL_TABLET | Freq: Every day | ORAL | Status: DC
  Administered 2018-12-03 – 2018-12-04 (×2): 10 mg via ORAL
  Filled 2018-12-03 (×2): qty 1

## 2018-12-03 MED ORDER — METHYLPREDNISOLONE SODIUM SUCC 40 MG IJ SOLR
40.0000 mg | Freq: Two times a day (BID) | INTRAMUSCULAR | Status: DC
  Administered 2018-12-03 (×3): 40 mg via INTRAVENOUS
  Filled 2018-12-03 (×3): qty 1

## 2018-12-03 MED ORDER — ONDANSETRON HCL 4 MG/2ML IJ SOLN: 4.0000 mg | Freq: Four times a day (QID) | INTRAMUSCULAR | Status: DC | PRN

## 2018-12-03 MED ORDER — NICOTINE 21 MG/24HR TD PT24
21.0000 mg | MEDICATED_PATCH | Freq: Every day | TRANSDERMAL | Status: DC
  Administered 2018-12-03 – 2018-12-04 (×2): 21 mg via TRANSDERMAL
  Filled 2018-12-03 (×2): qty 1

## 2018-12-03 MED ORDER — ORAL CARE MOUTH RINSE
15.0000 mL | Freq: Two times a day (BID) | OROMUCOSAL | Status: DC
  Administered 2018-12-03 – 2018-12-04 (×3): 15 mL via OROMUCOSAL

## 2018-12-03 MED ORDER — BUPROPION HCL ER (XL) 150 MG PO TB24
150.0000 mg | ORAL_TABLET | Freq: Every day | ORAL | Status: DC
  Administered 2018-12-03 – 2018-12-04 (×2): 150 mg via ORAL
  Filled 2018-12-03 (×2): qty 1

## 2018-12-03 MED ORDER — BUDESONIDE 0.25 MG/2ML IN SUSP
0.2500 mg | Freq: Two times a day (BID) | RESPIRATORY_TRACT | Status: DC
  Administered 2018-12-03 – 2018-12-04 (×4): 0.25 mg via RESPIRATORY_TRACT
  Filled 2018-12-03 (×4): qty 2

## 2018-12-03 MED ORDER — IPRATROPIUM-ALBUTEROL 0.5-2.5 (3) MG/3ML IN SOLN
3.0000 mL | Freq: Four times a day (QID) | RESPIRATORY_TRACT | Status: DC
  Administered 2018-12-03 (×4): 3 mL via RESPIRATORY_TRACT
  Filled 2018-12-03 (×4): qty 3

## 2018-12-03 MED ORDER — METHYLPREDNISOLONE SODIUM SUCC 40 MG IJ SOLR: 40.0000 mg | Freq: Two times a day (BID) | INTRAMUSCULAR | Status: DC

## 2018-12-03 MED ORDER — SODIUM CHLORIDE 0.9 % IV SOLN
INTRAVENOUS | Status: DC | PRN
  Administered 2018-12-03: 250 mL via INTRAVENOUS

## 2018-12-03 MED ORDER — IPRATROPIUM BROMIDE 0.02 % IN SOLN: 0.5000 mg | RESPIRATORY_TRACT | Status: DC

## 2018-12-03 MED ORDER — ASPIRIN EC 81 MG PO TBEC
81.0000 mg | DELAYED_RELEASE_TABLET | Freq: Every day | ORAL | Status: DC
  Administered 2018-12-03 – 2018-12-04 (×2): 81 mg via ORAL
  Filled 2018-12-03 (×2): qty 1

## 2018-12-03 MED ORDER — ONDANSETRON HCL 4 MG PO TABS: 4.0000 mg | ORAL_TABLET | Freq: Four times a day (QID) | ORAL | Status: DC | PRN

## 2018-12-03 MED ORDER — FLUTICASONE PROPIONATE 50 MCG/ACT NA SUSP
2.0000 | Freq: Every day | NASAL | Status: DC
  Administered 2018-12-04: 2 via NASAL
  Filled 2018-12-03 (×2): qty 16

## 2018-12-03 MED ORDER — ATORVASTATIN CALCIUM 40 MG PO TABS
40.0000 mg | ORAL_TABLET | ORAL | Status: DC
  Administered 2018-12-03: 40 mg via ORAL
  Filled 2018-12-03 (×3): qty 1

## 2018-12-03 MED ORDER — IPRATROPIUM-ALBUTEROL 0.5-2.5 (3) MG/3ML IN SOLN
3.0000 mL | Freq: Four times a day (QID) | RESPIRATORY_TRACT | Status: DC
  Administered 2018-12-04: 3 mL via RESPIRATORY_TRACT
  Filled 2018-12-03: qty 3

## 2018-12-03 MED ORDER — ENOXAPARIN SODIUM 40 MG/0.4ML ~~LOC~~ SOLN
40.0000 mg | SUBCUTANEOUS | Status: DC
  Administered 2018-12-03 – 2018-12-04 (×2): 40 mg via SUBCUTANEOUS
  Filled 2018-12-03 (×3): qty 0.4

## 2018-12-03 MED ORDER — AMLODIPINE BESYLATE 5 MG PO TABS
5.0000 mg | ORAL_TABLET | Freq: Every day | ORAL | Status: DC
  Administered 2018-12-03 – 2018-12-04 (×2): 5 mg via ORAL
  Filled 2018-12-03 (×2): qty 1

## 2018-12-03 MED ORDER — ALBUTEROL SULFATE (2.5 MG/3ML) 0.083% IN NEBU: 2.5000 mg | INHALATION_SOLUTION | RESPIRATORY_TRACT | Status: DC

## 2018-12-03 MED ORDER — ALBUTEROL SULFATE (2.5 MG/3ML) 0.083% IN NEBU
2.5000 mg | INHALATION_SOLUTION | RESPIRATORY_TRACT | Status: DC | PRN
  Administered 2018-12-03: 2.5 mg via RESPIRATORY_TRACT
  Filled 2018-12-03: qty 3

## 2018-12-03 MED ORDER — HYDRALAZINE HCL 20 MG/ML IJ SOLN
10.0000 mg | Freq: Four times a day (QID) | INTRAMUSCULAR | Status: DC | PRN
  Filled 2018-12-03: qty 1

## 2018-12-03 MED ORDER — SODIUM CHLORIDE 0.9 % IV SOLN
100.0000 mg | Freq: Two times a day (BID) | INTRAVENOUS | Status: DC
  Administered 2018-12-03 (×2): 100 mg via INTRAVENOUS
  Filled 2018-12-03 (×3): qty 100

## 2018-12-03 NOTE — Progress Notes (Signed)
Gave report to [place], RN on [address]. Left number in case she had additional questions. Patient is stable for transfer.

## 2018-12-03 NOTE — ED Notes (Signed)
ED TO INPATIENT HANDOFF REPORT  Name/Age/Gender Margaret Henry 68 y.o. female  Code Status    Code Status Orders  (From admission, onward)         Start     Ordered   12/03/18 0111  Full code  Continuous     12/03/18 0113        Code Status History    This patient has a current code status but no historical code status.      Home/SNF/Other Home  Chief Complaint Shorntess of Breath  Level of Care/Admitting Diagnosis ED Disposition    ED Disposition Condition [facility] [place] [[id number]]  Level of Care: Stepdown [14]  Admit to SDU based on following criteria: Respiratory Distress:  Frequent assessment and/or intervention to maintain adequate ventilation/respiration, pulmonary toilet, and respiratory treatment.  Diagnosis: Acute respiratory failure with hypoxia [facility]) [[id number]]  Admitting Physician: Rise Patience [phone]  Attending Physician: Rise Patience [phone]  Estimated length of stay: past midnight tomorrow  Certification:: I certify this patient will need inpatient services for at least 2 midnights  PT Class (Do Not Modify): Inpatient [101]  PT Acc Code (Do Not Modify): Private [1]       Medical History Past Medical History:  Diagnosis Date  . Allergy    hayfever  . Bronchitis   . Cancer ([place])    skin cancer on chest  . Colon polyps   . Complication of anesthesia    pt states was given too much during nasal surgery 1989; difficulty getting awake  . COPD (chronic obstructive pulmonary disease) ([place])   . Diverticulitis   . GERD (gastroesophageal reflux disease)    occasional  . Hemorrhoids   . High cholesterol   . Osteoporosis   . Pneumonia   . Shortness of breath dyspnea    with exertion   . Thyroid goiter    bx benign  . Vertigo     Allergies Allergies  Allergen Reactions  . Hydrocodone Nausea And Vomiting  . Propoxyphene N-Acetaminophen Nausea And Vomiting    IV  Location/Drains/Wounds Patient Lines/Drains/Airways Status   Active Line/Drains/Airways    Name:   Placement date:   Placement time:   Site:   Days:   Peripheral IV 12/02/18 Right Forearm   12/02/18    -    Forearm   1   Incision (Closed) 12/14/15 Rectum Other (Comment)   12/14/15    1315     1085          Labs/Imaging Results for orders placed or performed during the hospital encounter of 12/02/18 (from the past 48 hour(s))  Comprehensive metabolic panel     Status: Abnormal   Collection Time: 12/02/18 11:20 PM  Result Value Ref Range   Sodium 140 135 - 145 mmol/L   Potassium 3.8 3.5 - 5.1 mmol/L   Chloride 106 98 - 111 mmol/L   CO2 25 22 - 32 mmol/L   Glucose, Bld 163 (H) 70 - 99 mg/dL   BUN 8 8 - 23 mg/dL   Creatinine, Ser 0.50 0.44 - 1.00 mg/dL   Calcium 8.8 (L) 8.9 - 10.3 mg/dL   Total Protein 7.1 6.5 - 8.1 g/dL   Albumin 4.3 3.5 - 5.0 g/dL   AST 16 15 - 41 U/L   ALT 12 0 - 44 U/L   Alkaline Phosphatase 53 38 - 126 U/L   Total Bilirubin 0.4 0.3 - 1.2 mg/dL  GFR calc non Af Amer >60 >60 mL/min   GFR calc Af Amer >60 >60 mL/min   Anion gap 9 5 - 15    Comment: Performed at [facility], [place] [address] [place], [place] [address]  CBC with Differential     Status: Abnormal   Collection Time: 12/02/18 11:20 PM  Result Value Ref Range   WBC 9.2 4.0 - 10.5 K/uL   RBC 3.85 (L) 3.87 - 5.11 MIL/uL   Hemoglobin 11.9 (L) 12.0 - 15.0 g/dL   HCT 37.6 36.0 - 46.0 %   MCV 97.7 80.0 - 100.0 fL   MCH 30.9 26.0 - 34.0 pg   MCHC 31.6 30.0 - 36.0 g/dL   RDW 12.8 11.5 - 15.5 %   Platelets 344 150 - 400 K/uL   nRBC 0.0 0.0 - 0.2 %   Neutrophils Relative % 51 %   Neutro Abs 4.6 1.7 - 7.7 K/uL   Lymphocytes Relative 34 %   Lymphs Abs 3.2 0.7 - 4.0 K/uL   Monocytes Relative 11 %   Monocytes Absolute 1.0 0.1 - 1.0 K/uL   Eosinophils Relative 3 %   Eosinophils Absolute 0.3 0.0 - 0.5 K/uL   Basophils Relative 1 %   Basophils Absolute 0.1 0.0 - 0.1 K/uL   Immature  Granulocytes 0 %   Abs Immature Granulocytes 0.04 0.00 - 0.07 K/uL    Comment: Performed at [facility], [place] [address] [place], [place] [address]  Brain natriuretic peptide     Status: None   Collection Time: 12/02/18 11:20 PM  Result Value Ref Range   B Natriuretic Peptide 28.1 0.0 - 100.0 pg/mL    Comment: Performed at [facility], [place] [address], [place], [place] [address]  Troponin I - Once     Status: None   Collection Time: 12/02/18 11:20 PM  Result Value Ref Range   Troponin I <0.03 <0.03 ng/mL    Comment: Performed at [facility], [place] [address], [place], [place] [address]  Blood gas, arterial     Status: Abnormal   Collection Time: 12/03/18  1:25 AM  Result Value Ref Range   FIO2 30.00    Delivery systems BILEVEL POSITIVE AIRWAY PRESSURE    Inspiratory PAP 10    Expiratory PAP 5    pH, Arterial 7.336 (L) 7.350 - 7.450   pCO2 arterial 48.4 (H) 32.0 - 48.0 mmHg   pO2, Arterial 93.5 83.0 - 108.0 mmHg   Bicarbonate 25.2 20.0 - 28.0 mmol/L   Acid-base deficit 0.4 0.0 - 2.0 mmol/L   O2 Saturation 96.8 %   Patient temperature 98.6    Collection site RIGHT RADIAL    Drawn by [id number]    Sample type ARTERIAL    Allens test (pass/fail) PASS PASS    Comment: Performed at [facility], [place] [address], [place], [place] [address]  Comprehensive metabolic panel     Status: Abnormal   Collection Time: 12/03/18  4:55 AM  Result Value Ref Range   Sodium 140 135 - 145 mmol/L   Potassium 3.7 3.5 - 5.1 mmol/L   Chloride 107 98 - 111 mmol/L   CO2 25 22 - 32 mmol/L   Glucose, Bld 150 (H) 70 - 99 mg/dL   BUN 10 8 - 23 mg/dL   Creatinine, Ser 0.52 0.44 - 1.00 mg/dL   Calcium 8.8 (L) 8.9 - 10.3 mg/dL   Total Protein 6.5 6.5 - 8.1  g/dL   Albumin 3.9 3.5 - 5.0 g/dL   AST 18 15 - 41 U/L   ALT 11 0 - 44 U/L   Alkaline Phosphatase 50 38 - 126 U/L   Total Bilirubin 0.4 0.3 - 1.2 mg/dL   GFR calc non Af Amer >60  >60 mL/min   GFR calc Af Amer >60 >60 mL/min   Anion gap 8 5 - 15    Comment: Performed at [facility], [place] [address] [place], [place] [address]  CBC WITH DIFFERENTIAL     Status: Abnormal   Collection Time: 12/03/18  4:55 AM  Result Value Ref Range   WBC 9.2 4.0 - 10.5 K/uL   RBC 3.55 (L) 3.87 - 5.11 MIL/uL   Hemoglobin 10.8 (L) 12.0 - 15.0 g/dL   HCT 34.2 (L) 36.0 - 46.0 %   MCV 96.3 80.0 - 100.0 fL   MCH 30.4 26.0 - 34.0 pg   MCHC 31.6 30.0 - 36.0 g/dL   RDW 12.6 11.5 - 15.5 %   Platelets 315 150 - 400 K/uL   nRBC 0.0 0.0 - 0.2 %   Neutrophils Relative % 91 %   Neutro Abs 8.4 (H) 1.7 - 7.7 K/uL   Lymphocytes Relative 7 %   Lymphs Abs 0.6 (L) 0.7 - 4.0 K/uL   Monocytes Relative 1 %   Monocytes Absolute 0.1 0.1 - 1.0 K/uL   Eosinophils Relative 0 %   Eosinophils Absolute 0.0 0.0 - 0.5 K/uL   Basophils Relative 0 %   Basophils Absolute 0.0 0.0 - 0.1 K/uL   Immature Granulocytes 1 %   Abs Immature Granulocytes 0.06 0.00 - 0.07 K/uL    Comment: Performed at [facility], [place] [address], [place], [place] [address]  Troponin I - Tomorrow AM 0500     Status: None   Collection Time: 12/03/18  4:55 AM  Result Value Ref Range   Troponin I <0.03 <0.03 ng/mL    Comment: Performed at [facility] [place] [address], [place], [place] [address]   Dg Chest Port 1 View  Result Date: 12/02/2018 CLINICAL DATA:  Shortness of breath, COPD EXAM: PORTABLE CHEST 1 VIEW COMPARISON:  09/30/2018 FINDINGS: There is hyperinflation of the lungs compatible with COPD. Heart and mediastinal contours are within normal limits. No focal opacities or effusions. No acute bony abnormality. IMPRESSION: COPD.  No active disease. Electronically Signed   By: Rolm Baptise M.D.   On: 12/02/2018 23:29   EKG Interpretation  Date/Time:  Tuesday December 02 2018 23:16:37 EST Ventricular Rate:  103 PR Interval:    QRS Duration: 101 QT Interval:  341 QTC  Calculation: 447 R Axis:   16 Text Interpretation:  Sinus tachycardia Anteroseptal infarct, old Baseline wander in lead(s) V6 No significant change since last tracing 30 Sep 2018 Confirmed by Rolland Porter [phone]) on 12/03/2018 12:18:53 AM Also confirmed by Rolland Porter [phone]), editor Philomena Doheny [phone])  on 12/03/2018 7:52:56 AM   Pending Labs Unresulted Labs (From admission, onward)    Start     Ordered   12/10/18 0500  Creatinine, serum  (enoxaparin (LOVENOX)    CrCl >/= 30 ml/min)  Weekly,   R    Comments:  while on enoxaparin therapy    12/03/18 0113   12/04/18 0500  CBC  Daily,   R     12/03/18 0643   12/04/18 4193  Basic metabolic panel  Daily,   R  12/03/18 0643   12/03/18 0643  Influenza panel by PCR (type A & B)  ONCE - STAT,   STAT     12/03/18 0642   12/03/18 0502  Hemoglobin A1c  Once,   R     12/03/18 0502   12/03/18 0351  Respiratory Panel by PCR  (Respiratory virus panel with precautions)  Once,   R     12/03/18 0350   12/03/18 0110  HIV antibody (Routine Testing)  Once,   R     12/03/18 0113          Vitals/Pain Today's Vitals   12/03/18 0430 12/03/18 0530 12/03/18 0630 12/03/18 0700  BP: (!) 106/48 (!) 102/51 (!) 103/50 (!) 120/52  Pulse: 99 85 85 92  Resp: (!) 22 15 15 14   Temp:      TempSrc:      SpO2: 98% 98% 97% 97%  PainSc:        Isolation Precautions Droplet precaution  Medications Medications  aspirin EC tablet 81 mg (has no administration in time range)  atorvastatin (LIPITOR) tablet 40 mg (has no administration in time range)  fluticasone (FLONASE) 50 MCG/ACT nasal spray 2 spray (has no administration in time range)  loratadine (CLARITIN) tablet 10 mg (has no administration in time range)  ondansetron (ZOFRAN) tablet 4 mg (has no administration in time range)    Or  ondansetron (ZOFRAN) injection 4 mg (has no administration in time range)  enoxaparin (LOVENOX) injection 40 mg (has no administration in time range)  albuterol (PROVENTIL)  (2.5 MG/3ML) 0.083% nebulizer solution 2.5 mg (has no administration in time range)  budesonide (PULMICORT) nebulizer solution 0.25 mg (0.25 mg Nebulization Given 12/03/18 0144)  ipratropium-albuterol (DUONEB) 0.5-2.5 (3) MG/3ML nebulizer solution 3 mL (3 mLs Nebulization Given 12/03/18 0144)  methylPREDNISolone sodium succinate (SOLU-MEDROL) 40 mg/mL injection 40 mg (40 mg Intravenous Given 12/03/18 0404)  doxycycline (VIBRAMYCIN) 100 mg in sodium chloride 0.9 % 250 mL IVPB (100 mg Intravenous New Bag/Given 12/03/18 0452)  albuterol (PROVENTIL,VENTOLIN) solution continuous neb (10 mg/hr Nebulization Given 12/02/18 2327)    Mobility walks

## 2018-12-03 NOTE — H&P (Signed)
History and Physical    Margaret Henry:[id number] DOB: [birth year] DOA: 12/02/2018  PCP: [name], MD  Patient coming from: Home.  Chief Complaint: Shortness of breath.  HPI: Margaret Henry is a 67 y.o. female with history of COPD, hyperlipidemia, CAD presents to the ER after patient became suddenly short of breath last night while watching TV.  Patient states she is chronically short of breath but acutely got worsened yesterday.  Started to wheeze and coughing productive of sputum.  Denies chest pain nausea vomiting abdominal pain or diarrhea.  EMS was called and patient was brought to the ER.  Patient states her son had influenza last week.  Patient also had some sore throat 2 weeks ago.  ED Course: Due to hypoxia patient was placed on BiPAP.  Chest x-ray was unremarkable.  EKG shows sinus tachycardia.  BNP was 28.1 blood work showed anemia which appears to be new.  Patient was placed on nebulizer steroids and admitted for acute respiratory failure secondary to COPD exacerbation.  ABG showed pH of 7.33 PCO2 of 48.4.  On my exam patient states he is feeling better but still short of breath.  We will try to wean off BiPAP.  Review of Systems: As per HPI, rest all negative.   Past Medical History:  Diagnosis Date  . Allergy    hayfever  . Bronchitis   . Cancer ([place])    skin cancer on chest  . Colon polyps   . Complication of anesthesia    pt states was given too much during nasal surgery 1989; difficulty getting awake  . COPD (chronic obstructive pulmonary disease) ([place])   . Diverticulitis   . GERD (gastroesophageal reflux disease)    occasional  . Hemorrhoids   . High cholesterol   . Osteoporosis   . Pneumonia   . Shortness of breath dyspnea    with exertion   . Thyroid goiter    bx benign  . Vertigo     Past Surgical History:  Procedure Laterality Date  . ABDOMINAL HYSTERECTOMY    . BILATERAL OOPHORECTOMY  2001   for benign ovarian mass   . BIOPSY THYROID     . DENTAL SURGERY     dentures  . NASAL SEPTUM SURGERY    . PARTIAL HYSTERECTOMY  1976   for heavy menses   . RECTAL EXAM UNDER ANESTHESIA N/A 12/14/2015   Procedure: RECTAL EXAM UNDER ANESTHESIA REMOVAL OF ANAL CANAL MASS; INTERNAL HEMORRHOID LIGATION, EXTERNAL HEMORRHOID LIGATION;  Surgeon: Michael Boston, MD;  Location: [facility];  Service: General;  Laterality: N/A;  . TUBAL LIGATION    . WISDOM TOOTH EXTRACTION       reports that she quit smoking about 4 weeks ago. Her smoking use included cigarettes. She has a 73.50 pack-year smoking history. She has never used smokeless tobacco. She reports that she does not drink alcohol or use drugs.  Allergies  Allergen Reactions  . Hydrocodone Nausea And Vomiting  . Propoxyphene N-Acetaminophen Nausea And Vomiting    Family History  Problem Relation Age of Onset  . Heart disease Mother   . COPD Father   . Lung cancer Maternal Grandfather   . Liver cancer Paternal Grandmother   . Colon cancer Neg Hx   . Colon polyps Neg Hx   . Esophageal cancer Neg Hx   . Rectal cancer Neg Hx   . Stomach cancer Neg Hx     Prior to Admission medications   Medication Sig  Start Date End Date Taking? Authorizing Provider  acetaminophen (TYLENOL) 500 MG tablet Take 1,500 mg by mouth every 4 (four) hours as needed for headache.    Yes [provider]  albuterol (PROVENTIL) (2.5 MG/3ML) 0.083% nebulizer solution TAKE 3 MLS (2.5 MG TOTAL) BY NEBULIZATION EVERY 6 (SIX) HOURS AS NEEDED FOR WHEEZING OR SHORTNESS OF BREATH. 10/11/18  Yes [name], MD  aspirin EC 81 MG tablet Take 1 tablet (81 mg total) by mouth daily. 06/05/18  Yes [name], MD  atorvastatin (LIPITOR) 40 MG tablet Take 40 mg by mouth every other day.   Yes [provider]  fluticasone (FLONASE) 50 MCG/ACT nasal spray Place 2 sprays into both nostrils daily. 03/17/18  Yes Newlin, Enobong, MD  fluticasone furoate-vilanterol (BREO ELLIPTA) 100-25 MCG/INH AEPB Inhale 1 puff  into the lungs daily. 08/04/18  Yes Mannam, Praveen, MD  loratadine (CLARITIN) 10 MG tablet TAKE 1 TABLET (10 MG TOTAL) BY MOUTH DAILY. 02/24/18  Yes Mannam, Praveen, MD  umeclidinium bromide (INCRUSE ELLIPTA) 62.5 MCG/INH AEPB Inhale 1 puff into the lungs daily. 11/05/18  Yes Mannam, Praveen, MD  VENTOLIN HFA 108 (90 Base) MCG/ACT inhaler INHALE 2 PUFFS INTO THE LUNGS EVERY 4 HOURS AS NEEDED FOR WHEEZING OR SHORTNESS OF BREATH Patient taking differently: Inhale 2 puffs into the lungs every 4 (four) hours as needed for wheezing or shortness of breath.  11/26/18  Yes [name], MD  OXYGEN Inhale 2 L into the lungs.    [provider]    Physical Exam: Vitals:   12/02/18 2350 12/03/18 0000 12/03/18 0030 12/03/18 0100  BP: 121/71 102/60 (!) 115/58 107/65  Pulse: (!) 111 97 98 (!) 102  Resp: 18 12 12 15   Temp:      TempSrc:      SpO2: 100% 100% 100% 98%      Constitutional: Moderately built and nourished. Vitals:   12/02/18 2350 12/03/18 0000 12/03/18 0030 12/03/18 0100  BP: 121/71 102/60 (!) 115/58 107/65  Pulse: (!) 111 97 98 (!) 102  Resp: 18 12 12 15   Temp:      TempSrc:      SpO2: 100% 100% 100% 98%   Eyes: Anicteric no pallor. ENMT: No discharge from the ears eyes nose and mouth. Neck: No mass felt.  No neck rigidity.  No JVD appreciated. Respiratory: Bilateral expiratory wheeze and no crepitations. Cardiovascular: S1-S2 heard. Abdomen: Soft nontender bowel sounds present. Musculoskeletal: No edema.  No joint effusion. Skin: No rash. Neurologic: Alert awake oriented to time place and person.  Moves all extremities. Psychiatric: Appears normal.  Normal affect.   Labs on Admission: I have personally reviewed following labs and imaging studies  CBC: Recent Labs  Lab 12/02/18 2320  WBC 9.2  NEUTROABS 4.6  HGB 11.9*  HCT 37.6  MCV 97.7  PLT 885   Basic Metabolic Panel: Recent Labs  Lab 12/02/18 2320  NA 140  K 3.8  CL 106  CO2 25  GLUCOSE 163*    BUN 8  CREATININE 0.50  CALCIUM 8.8*   GFR: CrCl cannot be calculated (Unknown ideal weight.). Liver Function Tests: Recent Labs  Lab 12/02/18 2320  AST 16  ALT 12  ALKPHOS 53  BILITOT 0.4  PROT 7.1  ALBUMIN 4.3   No results for input(s): LIPASE, AMYLASE in the last 168 hours. No results for input(s): AMMONIA in the last 168 hours. Coagulation Profile: No results for input(s): INR, PROTIME in the last 168 hours. Cardiac Enzymes: Recent  Labs  Lab 12/02/18 2320  TROPONINI <0.03   BNP (last 3 results) No results for input(s): PROBNP in the last 8760 hours. HbA1C: No results for input(s): HGBA1C in the last 72 hours. CBG: No results for input(s): GLUCAP in the last 168 hours. Lipid Profile: No results for input(s): CHOL, HDL, LDLCALC, TRIG, CHOLHDL, LDLDIRECT in the last 72 hours. Thyroid Function Tests: No results for input(s): TSH, T4TOTAL, FREET4, T3FREE, THYROIDAB in the last 72 hours. Anemia Panel: No results for input(s): VITAMINB12, FOLATE, FERRITIN, TIBC, IRON, RETICCTPCT in the last 72 hours. Urine analysis:    Component Value Date/Time   LABSPEC <1.005 01/21/2009 0954   PHURINE 7.0 01/21/2009 0954   HGBUR trace-lysed 01/21/2009 0954   BILIRUBINUR negative 01/21/2009 0954   UROBILINOGEN 0.2 01/21/2009 0954   NITRITE negative 01/21/2009 0954   Sepsis Labs: @LABRCNTIP (procalcitonin:4,lacticidven:4) )No results found for this or any previous visit (from the past 240 hour(s)).   Radiological Exams on Admission: Dg Chest Port 1 View  Result Date: 12/02/2018 CLINICAL DATA:  Shortness of breath, COPD EXAM: PORTABLE CHEST 1 VIEW COMPARISON:  09/30/2018 FINDINGS: There is hyperinflation of the lungs compatible with COPD. Heart and mediastinal contours are within normal limits. No focal opacities or effusions. No acute bony abnormality. IMPRESSION: COPD.  No active disease. Electronically Signed   By: Rolm Baptise M.D.   On: 12/02/2018 23:29    EKG:  Independently reviewed.  Sinus tachycardia.  Assessment/Plan Active Problems:   Hyperlipidemia   COPD exacerbation ([facility])   Acute respiratory failure with hypoxia ([place])    1. Acute respiratory failure with hypoxia and hypercarbia secondary to COPD exacerbation.  Patient is on BiPAP we will try to wean off.  On IV Solu-Medrol duo nebs Pulmicort and I have added doxycycline also.  Check respiratory viral panel. 2. Hyperlipidemia on statins. 3. CAD being followed by cardiologist.  Has not had any intervention.  Denies any chest pain.  On aspirin and statins. 4. Tobacco abuse -tobacco cessation counseling requested. 5. Normocytic normochromic anemia appears to be new.  Check anemia panel with next blood draw.  Follow CBC.   DVT prophylaxis: Lovenox. Code Status: Full code. Family Communication: Discussed with patient. Disposition Plan: Home. Consults called: None. Admission status: Inpatient.   Rise Patience MD [facility] Pager [phone].  If 7PM-7AM, please contact night-coverage [email or web address] Password [email or web address]  12/03/2018, 1:13 AM

## 2018-12-03 NOTE — Progress Notes (Signed)
[facility]                                                                              Patient Demographics  Margaret Henry, is a 68 y.o. female, DOB - 21-Nov-1950, HER:[id number]  Admit date - 12/02/2018   Admitting Physician Margaret Patience, MD  Outpatient Primary MD for the patient is Margaret Rakes, MD  Outpatient specialists:   LOS - 0  days   Medical records reviewed and are as summarized below:    Chief Complaint  Patient presents with  . Shortness of Breath       Brief summary   [name] is a 68 y.o. female with history of COPD, hyperlipidemia, CAD presents to the ER after patient became suddenly short of breath last night while watching TV.  Patient states she is chronically short of breath but acutely got worsened yesterday.  Started to wheeze and coughing productive of sputum.  Denies chest pain nausea vomiting abdominal pain or diarrhea.  EMS was called and patient was brought to the ER. Patient states her son had influenza last week.  Patient also had some sore throat 2 weeks ago. In ED, patient was hypoxic, was placed on BiPAP.  BNP 28.1.  Assessment & Plan    Acute on chronic respiratory failure with hypoxia and hypercarbia secondary to COPD exacerbation -Mild scattered wheezing, back on 2 L O2 via nasal cannula, off BiPAP.  On 2 L O2 at home. -Continue IV Solu-Medrol, duo nebs, Pulmicort, doxycycline -Influenza panel negative   -Counseled strongly on nicotine cessation, continue nicotine patch, added low-dose Wellbutrin, patient states quit 3 weeks ago however still struggling    Hyperlipidemia -Continue statin  Normocytic anemia -H&H currently stable, follow anemia panel  Hypertension BP readings elevated, placed on Norvasc 5 mg daily, hydralazine IV as needed with parameters   Code Status: Full CODE STATUS DVT Prophylaxis: Lovenox Family Communication: Discussed in detail with the patient, all imaging results, lab  results explained to the patient    Disposition Plan: If no acute issues, transfer to floor, likely DC home in a.m if remains stable.  Time Spent in minutes 35 minutes  Procedures:  BiPAP  Consultants:   None  Antimicrobials:   Anti-infectives (From admission, onward)   Start     Dose/Rate Route Frequency Ordered Stop   12/03/18 0415  doxycycline (VIBRAMYCIN) 100 mg in sodium chloride 0.9 % 250 mL IVPB     100 mg 125 mL/hr over 120 Minutes Intravenous 2 times daily 12/03/18 0351            Medications  Scheduled Meds: . amLODipine  5 mg Oral Daily  . aspirin EC  81 mg Oral Daily  . atorvastatin  40 mg Oral QODAY  . budesonide (PULMICORT) nebulizer solution  0.25 mg Nebulization BID  . buPROPion  150 mg Oral Daily  . enoxaparin (LOVENOX) injection  40 mg Subcutaneous Q24H  . fluticasone  2 spray Each Nare Daily  . ipratropium-albuterol  3 mL Nebulization Q6H  . loratadine  10 mg Oral Daily  . mouth rinse  15 mL Mouth Rinse BID  .  methylPREDNISolone (SOLU-MEDROL) injection  40 mg Intravenous BID   Continuous Infusions: . doxycycline (VIBRAMYCIN) IV Stopped (12/03/18 0820)   PRN Meds:.albuterol, hydrALAZINE, ondansetron **OR** ondansetron (ZOFRAN) IV      Subjective:   Margaret Henry was seen and examined today.  Feeling a lot better, off BiPAP, wheezing is improving.  Less of breath is better.  Patient denies dizziness, chest pain, abdominal pain, N/V/D/C, new weakness, numbess, tingling.  No fevers  Objective:   Vitals:   12/03/18 0846 12/03/18 0900 12/03/18 1000 12/03/18 1100  BP: (!) 159/53 (!) 150/57 (!) 139/49 (!) 135/53  Pulse: (!) 110 (!) 104 (!) 104 92  Resp: 17 19 16 16   Temp: (!) 97.5 F (36.4 C)     TempSrc: Oral     SpO2: 92% 98% 92% 98%  Weight: 42.5 kg     Height: 5\' 4"  (1.626 m)       Intake/Output Summary (Last 24 hours) at 12/03/2018 1150 Last data filed at 12/03/2018 1100 Gross per 24 hour  Intake 14.57 ml  Output 350 ml  Net  -335.43 ml     Wt Readings from Last 3 Encounters:  12/03/18 42.5 kg  11/05/18 43.7 kg  10/22/18 43.7 kg     Exam  General: Alert and oriented x 3, NAD  Eyes: PERRLA, EOMI, Anicteric Sclera,  HEENT:  Atraumatic, normocephalic, normal oropharynx  Cardiovascular: S1 S2 auscultated, no rubs, murmurs or gallops. Regular rate and rhythm.  Respiratory: Mild scattered wheezing   Gastrointestinal: Soft, nontender, nondistended, + bowel sounds  Ext: no pedal edema bilaterally  Neuro: No new deficits  Musculoskeletal: No digital cyanosis, clubbing  Skin: No rashes  Psych: Normal affect and demeanor, alert and oriented x3    Data Reviewed:  I have personally reviewed following labs and imaging studies  Micro Results Recent Results (from the past 240 hour(s))  MRSA PCR Screening     Status: None   Collection Time: 12/03/18  9:08 AM  Result Value Ref Range Status   MRSA by PCR NEGATIVE NEGATIVE Final    Comment:        The GeneXpert MRSA Assay (FDA approved for NASAL specimens only), is one component of a comprehensive MRSA colonization surveillance program. It is not intended to diagnose MRSA infection nor to guide or monitor treatment for MRSA infections. Performed at [facility], [place] [address] [place], [place] [address]     Radiology Reports Dg Chest Port 1 View  Result Date: 12/02/2018 CLINICAL DATA:  Shortness of breath, COPD EXAM: PORTABLE CHEST 1 VIEW COMPARISON:  09/30/2018 FINDINGS: There is hyperinflation of the lungs compatible with COPD. Heart and mediastinal contours are within normal limits. No focal opacities or effusions. No acute bony abnormality. IMPRESSION: COPD.  No active disease. Electronically Signed   By: Rolm Baptise M.D.   On: 12/02/2018 23:29    Lab Data:  CBC: Recent Labs  Lab 12/02/18 2320 12/03/18 0455  WBC 9.2 9.2  NEUTROABS 4.6 8.4*  HGB 11.9* 10.8*  HCT 37.6 34.2*  MCV 97.7 96.3  PLT 344 330    Basic Metabolic Panel: Recent Labs  Lab 12/02/18 2320 12/03/18 0455  NA 140 140  K 3.8 3.7  CL 106 107  CO2 25 25  GLUCOSE 163* 150*  BUN 8 10  CREATININE 0.50 0.52  CALCIUM 8.8* 8.8*   GFR: Estimated Creatinine Clearance: 45.2 mL/min (by C-G formula based on SCr of 0.52 mg/dL). Liver Function Tests: Recent Labs  Lab 12/02/18  2320 12/03/18 0455  AST 16 18  ALT 12 11  ALKPHOS 53 50  BILITOT 0.4 0.4  PROT 7.1 6.5  ALBUMIN 4.3 3.9   No results for input(s): LIPASE, AMYLASE in the last 168 hours. No results for input(s): AMMONIA in the last 168 hours. Coagulation Profile: No results for input(s): INR, PROTIME in the last 168 hours. Cardiac Enzymes: Recent Labs  Lab 12/02/18 2320 12/03/18 0455  TROPONINI <0.03 <0.03   BNP (last 3 results) No results for input(s): PROBNP in the last 8760 hours. HbA1C: Recent Labs    12/03/18 0455  HGBA1C 5.4   CBG: No results for input(s): GLUCAP in the last 168 hours. Lipid Profile: No results for input(s): CHOL, HDL, LDLCALC, TRIG, CHOLHDL, LDLDIRECT in the last 72 hours. Thyroid Function Tests: No results for input(s): TSH, T4TOTAL, FREET4, T3FREE, THYROIDAB in the last 72 hours. Anemia Panel: No results for input(s): VITAMINB12, FOLATE, FERRITIN, TIBC, IRON, RETICCTPCT in the last 72 hours. Urine analysis:    Component Value Date/Time   LABSPEC <1.005 01/21/2009 [place] 7.0 01/21/2009 0954   HGBUR trace-lysed 01/21/2009 0954   BILIRUBINUR negative 01/21/2009 0954   UROBILINOGEN 0.2 01/21/2009 0954   NITRITE negative 01/21/2009 0954     Elsia Lasota M.D. [facility] 12/03/2018, 11:50 AM  Pager: [phone] Between 7am to 7pm - call Pager - [phone]  After 7pm go to [email or web address] - password [email or web address]  Call night coverage person covering after 7pm

## 2018-12-03 NOTE — ED Provider Notes (Addendum)
[facility] DEPT Provider Note   CSN: [id number] Arrival date & time: 12/02/18  2310  Time seen 11:03 PM  History   Chief Complaint Chief Complaint  Patient presents with  . Shortness of Breath   Level 5 caveat for acute respiratory distress  HPI Margaret Henry is a 68 y.o. female.     HPI patient presents via EMS.  They report she had acute onset of shortness of breath tonight.  They report first responders had around 6 L of oxygen nasal cannula and her pulse ox was in the low 90s.  They gave her a DuoNeb twice,Solu-Medrol 125 mg IV and 2 g of magnesium IV.  They report she continues to struggle to breathe and they ended up putting her on CPAP.  When I asked patient just a few questions she states the shortness of breath started suddenly.  She denies chest pain.  She does states she has been coughing.  She states she is chronically on 2 L/min nasal cannula.  PCP [name], MD   Past Medical History:  Diagnosis Date  . Allergy    hayfever  . Bronchitis   . Cancer ([place])    skin cancer on chest  . Colon polyps   . Complication of anesthesia    pt states was given too much during nasal surgery 1989; difficulty getting awake  . COPD (chronic obstructive pulmonary disease) ([place])   . Diverticulitis   . GERD (gastroesophageal reflux disease)    occasional  . Hemorrhoids   . High cholesterol   . Osteoporosis   . Pneumonia   . Shortness of breath dyspnea    with exertion   . Thyroid goiter    bx benign  . Vertigo     Patient Active Problem List   Diagnosis Date Noted  . COPD, group D, by GOLD 2017 classification ([place]) 10/03/2018  . Overactive bladder 03/17/2018  . Chronic respiratory failure ([place]) 10/04/2017  . Vertigo 04/21/2017  . Dyspnea 06/27/2016  . Seborrheic keratoses 06/12/2016  . Chest pain 06/12/2016  . Coronary arteriosclerosis 09/26/2015  . COPD exacerbation ([place]) 06/01/2013  . Unspecified vitamin D deficiency  03/02/2010  . Allergic rhinitis 03/02/2010  . BACK PAIN, LUMBAR 03/02/2010  . TROCHANTERIC BURSITIS, BILATERAL 03/02/2010  . THYROID NODULE, RIGHT 11/09/2009  . DEPRESSION 11/09/2009  . Hyperlipidemia 02/25/2009  . GERD 02/25/2009  . OSTEOPOROSIS 01/30/2009  . COMPRESSION FRACTURE, SPINE 01/21/2009  . TOBACCO ABUSE 09/07/2008  . COPD (chronic obstructive pulmonary disease) ([facility]) 09/07/2008  . Goiter, unspecified 10/08/1997    Past Surgical History:  Procedure Laterality Date  . ABDOMINAL HYSTERECTOMY    . BILATERAL OOPHORECTOMY  2001   for benign ovarian mass   . BIOPSY THYROID    . DENTAL SURGERY     dentures  . NASAL SEPTUM SURGERY    . PARTIAL HYSTERECTOMY  1976   for heavy menses   . RECTAL EXAM UNDER ANESTHESIA N/A 12/14/2015   Procedure: RECTAL EXAM UNDER ANESTHESIA REMOVAL OF ANAL CANAL MASS; INTERNAL HEMORRHOID LIGATION, EXTERNAL HEMORRHOID LIGATION;  Surgeon: Michael Boston, MD;  Location: [facility];  Service: General;  Laterality: N/A;  . TUBAL LIGATION    . WISDOM TOOTH EXTRACTION       OB History   No obstetric history on file.      Home Medications    Prior to Admission medications   Medication Sig Start Date End Date Taking? Authorizing Provider  acetaminophen (TYLENOL) 500 MG tablet Take  1,500 mg by mouth every 4 (four) hours as needed for headache.    Yes [provider]  albuterol (PROVENTIL) (2.5 MG/3ML) 0.083% nebulizer solution TAKE 3 MLS (2.5 MG TOTAL) BY NEBULIZATION EVERY 6 (SIX) HOURS AS NEEDED FOR WHEEZING OR SHORTNESS OF BREATH. 10/11/18  Yes [name], MD  aspirin EC 81 MG tablet Take 1 tablet (81 mg total) by mouth daily. 06/05/18  Yes [name], MD  atorvastatin (LIPITOR) 40 MG tablet Take 40 mg by mouth every other day.   Yes [provider]  fluticasone (FLONASE) 50 MCG/ACT nasal spray Place 2 sprays into both nostrils daily. 03/17/18  Yes Newlin, Enobong, MD  fluticasone furoate-vilanterol (BREO ELLIPTA) 100-25 MCG/INH  AEPB Inhale 1 puff into the lungs daily. 08/04/18  Yes Mannam, Praveen, MD  loratadine (CLARITIN) 10 MG tablet TAKE 1 TABLET (10 MG TOTAL) BY MOUTH DAILY. 02/24/18  Yes Mannam, Praveen, MD  umeclidinium bromide (INCRUSE ELLIPTA) 62.5 MCG/INH AEPB Inhale 1 puff into the lungs daily. 11/05/18  Yes Mannam, Praveen, MD  VENTOLIN HFA 108 (90 Base) MCG/ACT inhaler INHALE 2 PUFFS INTO THE LUNGS EVERY 4 HOURS AS NEEDED FOR WHEEZING OR SHORTNESS OF BREATH Patient taking differently: Inhale 2 puffs into the lungs every 4 (four) hours as needed for wheezing or shortness of breath.  11/26/18  Yes [name], MD  OXYGEN Inhale 2 L into the lungs.    [provider]    Family History Family History  Problem Relation Age of Onset  . Heart disease Mother   . COPD Father   . Lung cancer Maternal Grandfather   . Liver cancer Paternal Grandmother   . Colon cancer Neg Hx   . Colon polyps Neg Hx   . Esophageal cancer Neg Hx   . Rectal cancer Neg Hx   . Stomach cancer Neg Hx     Social History Social History   Tobacco Use  . Smoking status: Former Smoker    Packs/day: 1.50    Years: 49.00    Pack years: 73.50    Types: Cigarettes    Last attempt to quit: 11/03/2018    Years since quitting: 0.0  . Smokeless tobacco: Never Used  . Tobacco comment: 10/03/18 - 2-3 cigarette  Substance Use Topics  . Alcohol use: No    Alcohol/week: 0.0 standard drinks  . Drug use: No  On home oxygen   Allergies   Hydrocodone and Propoxyphene n-acetaminophen   Review of Systems Review of Systems  All other systems reviewed and are negative.    Physical Exam Updated Vital Signs BP 121/71   Pulse (!) 111   Temp 97.6 F (36.4 C) (Axillary)   Resp 18   SpO2 100%   Vital signs normal except for tachycardia and tachypnea   Physical Exam Vitals signs and nursing note reviewed.  Constitutional:      General: She is in acute distress.     Comments: Small frail female  HENT:     Head:  Normocephalic and atraumatic.     Nose:     Comments: Patient has CPAP mask in place    Mouth/Throat:     Comments: Patient has CPAP mask in place Eyes:     Extraocular Movements: Extraocular movements intact.     Conjunctiva/sclera: Conjunctivae normal.     Pupils: Pupils are equal, round, and reactive to light.  Neck:     Musculoskeletal: Normal range of motion.  Cardiovascular:     Rate and Rhythm: Regular  rhythm. Tachycardia present.  Pulmonary:     Effort: Tachypnea, accessory muscle usage, prolonged expiration, respiratory distress and retractions present.     Breath sounds: Decreased air movement present. No wheezing, rhonchi or rales.  Neurological:     Mental Status: She is alert.      ED Treatments / Results  Labs (all labs ordered are listed, but only abnormal results are displayed) Results for orders placed or performed during the hospital encounter of 12/02/18  Comprehensive metabolic panel  Result Value Ref Range   Sodium 140 135 - 145 mmol/L   Potassium 3.8 3.5 - 5.1 mmol/L   Chloride 106 98 - 111 mmol/L   CO2 25 22 - 32 mmol/L   Glucose, Bld 163 (H) 70 - 99 mg/dL   BUN 8 8 - 23 mg/dL   Creatinine, Ser 0.50 0.44 - 1.00 mg/dL   Calcium 8.8 (L) 8.9 - 10.3 mg/dL   Total Protein 7.1 6.5 - 8.1 g/dL   Albumin 4.3 3.5 - 5.0 g/dL   AST 16 15 - 41 U/L   ALT 12 0 - 44 U/L   Alkaline Phosphatase 53 38 - 126 U/L   Total Bilirubin 0.4 0.3 - 1.2 mg/dL   GFR calc non Af Amer >60 >60 mL/min   GFR calc Af Amer >60 >60 mL/min   Anion gap 9 5 - 15  CBC with Differential  Result Value Ref Range   WBC 9.2 4.0 - 10.5 K/uL   RBC 3.85 (L) 3.87 - 5.11 MIL/uL   Hemoglobin 11.9 (L) 12.0 - 15.0 g/dL   HCT 37.6 36.0 - 46.0 %   MCV 97.7 80.0 - 100.0 fL   MCH 30.9 26.0 - 34.0 pg   MCHC 31.6 30.0 - 36.0 g/dL   RDW 12.8 11.5 - 15.5 %   Platelets 344 150 - 400 K/uL   nRBC 0.0 0.0 - 0.2 %   Neutrophils Relative % 51 %   Neutro Abs 4.6 1.7 - 7.7 K/uL   Lymphocytes Relative 34 %     Lymphs Abs 3.2 0.7 - 4.0 K/uL   Monocytes Relative 11 %   Monocytes Absolute 1.0 0.1 - 1.0 K/uL   Eosinophils Relative 3 %   Eosinophils Absolute 0.3 0.0 - 0.5 K/uL   Basophils Relative 1 %   Basophils Absolute 0.1 0.0 - 0.1 K/uL   Immature Granulocytes 0 %   Abs Immature Granulocytes 0.04 0.00 - 0.07 K/uL  Brain natriuretic peptide  Result Value Ref Range   B Natriuretic Peptide 28.1 0.0 - 100.0 pg/mL  Troponin I - Once  Result Value Ref Range   Troponin I <0.03 <0.03 ng/mL   Laboratory interpretation all normal except hyperglycemia    EKG EKG Interpretation  Date/Time:  Tuesday December 02 2018 23:16:37 EST Ventricular Rate:  103 PR Interval:    QRS Duration: 101 QT Interval:  341 QTC Calculation: 447 R Axis:   16 Text Interpretation:  Sinus tachycardia Anteroseptal infarct, old Baseline wander in lead(s) V6 No significant change since last tracing 30 Sep 2018 Confirmed by Rolland Porter [phone]) on 12/03/2018 12:18:53 AM   Radiology Dg Chest Port 1 View  Result Date: 12/02/2018 CLINICAL DATA:  Shortness of breath, COPD EXAM: PORTABLE CHEST 1 VIEW COMPARISON:  09/30/2018 FINDINGS: There is hyperinflation of the lungs compatible with COPD. Heart and mediastinal contours are within normal limits. No focal opacities or effusions. No acute bony abnormality. IMPRESSION: COPD.  No active disease. Electronically Signed   By:  Rolm Baptise M.D.   On: 12/02/2018 23:29    Procedures .Critical Care Performed by: Rolland Porter, MD Authorized by: Rolland Porter, MD   Critical care provider statement:    Critical care time (minutes):  37   Critical care was necessary to treat or prevent imminent or life-threatening deterioration of the following conditions:  Respiratory failure   Critical care was time spent personally by me on the following activities:  Discussions with consultants, examination of patient, obtaining history from patient or surrogate, ordering and review of laboratory  studies, ordering and review of radiographic studies, pulse oximetry and re-evaluation of patient's condition   (including critical care time)  Medications Ordered in ED Medications  albuterol (PROVENTIL,VENTOLIN) solution continuous neb (10 mg/hr Nebulization Given 12/02/18 2327)     Initial Impression / Assessment and Plan / ED Course  I have reviewed the triage vital signs and the nursing notes.  Pertinent labs & imaging results that were available during my care of the patient were reviewed by me and considered in my medical decision making (see chart for details).       Patient was switched over from CPAP to BiPAP.  A continuous nebulizer with albuterol was ordered.  Chest x-ray and laboratory tests were ordered.  Recheck at 11:28 PM patient states she is feeling better on the BiPAP.  When I first saw her she was panicking from being on the CPAP.  When I re-listened to her she has continued very diminished breath sounds.  Respiratory therapy is just starting her continuous nebulizer.  Recheck at 12:40 AM patient continues on BiPAP.  She is finished her continuous nebulizer.  She indicates she is feeling better.  She also told me she had quit smoking but she did smoke a cigarette earlier today.  When I recheck her now she now has improved air movement.  There is no wheezing or rhonchi.  She is resting comfortably and appears to be improved.  We discussed admission.  12:52 AM Dr.Kakrakandy, hospitalist will admit.  Final Clinical Impressions(s) / ED Diagnoses   Final diagnoses:  COPD exacerbation [facility])    Plan admission  Rolland Porter, MD, Barbette Or, MD 12/03/18 [place], [place], MD 12/03/18 [phone]

## 2018-12-03 NOTE — ED Notes (Signed)
Report given to Michelle RN

## 2018-12-04 ENCOUNTER — Ambulatory Visit: Payer: Medicare HMO | Admitting: Physician Assistant

## 2018-12-04 DIAGNOSIS — E782 Mixed hyperlipidemia: Secondary | ICD-10-CM

## 2018-12-04 DIAGNOSIS — J42 Unspecified chronic bronchitis: Secondary | ICD-10-CM

## 2018-12-04 LAB — CBC
HCT: 32.9 % — ABNORMAL LOW (ref 36.0–46.0)
Hemoglobin: 10.4 g/dL — ABNORMAL LOW (ref 12.0–15.0)
MCH: 30.9 pg (ref 26.0–34.0)
MCHC: 31.6 g/dL (ref 30.0–36.0)
MCV: 97.6 fL (ref 80.0–100.0)
Platelets: 317 10*3/uL (ref 150–400)
RBC: 3.37 MIL/uL — ABNORMAL LOW (ref 3.87–5.11)
RDW: 12.8 % (ref 11.5–15.5)
WBC: 12.9 10*3/uL — ABNORMAL HIGH (ref 4.0–10.5)
nRBC: 0 % (ref 0.0–0.2)

## 2018-12-04 LAB — BASIC METABOLIC PANEL
Anion gap: 4 — ABNORMAL LOW (ref 5–15)
BUN: 11 mg/dL (ref 8–23)
CHLORIDE: 112 mmol/L — AB (ref 98–111)
CO2: 26 mmol/L (ref 22–32)
Calcium: 8.9 mg/dL (ref 8.9–10.3)
Creatinine, Ser: 0.48 mg/dL (ref 0.44–1.00)
GFR calc Af Amer: >60 mL/min (ref >60)
GFR calc non Af Amer: >60 mL/min (ref >60)
Glucose, Bld: 128 mg/dL — ABNORMAL HIGH (ref 70–99)
POTASSIUM: 4.4 mmol/L (ref 3.5–5.1)
Sodium: 142 mmol/L (ref 135–145)

## 2018-12-04 MED ORDER — TIOTROPIUM BROMIDE MONOHYDRATE 18 MCG IN CAPS: 18.0000 ug | ORAL_CAPSULE | Freq: Every day | RESPIRATORY_TRACT | 11 refills | Status: DC

## 2018-12-04 MED ORDER — DOXYCYCLINE HYCLATE 100 MG PO TABS: 100.0000 mg | ORAL_TABLET | Freq: Two times a day (BID) | ORAL | 0 refills | Status: AC

## 2018-12-04 MED ORDER — NICOTINE 21 MG/24HR TD PT24: 21.0000 mg | MEDICATED_PATCH | Freq: Every day | TRANSDERMAL | 0 refills | Status: DC

## 2018-12-04 MED ORDER — DOXYCYCLINE HYCLATE 100 MG PO TABS
100.0000 mg | ORAL_TABLET | Freq: Two times a day (BID) | ORAL | Status: DC
  Administered 2018-12-04: 100 mg via ORAL
  Filled 2018-12-04: qty 1

## 2018-12-04 MED ORDER — PREDNISONE 10 MG PO TABS: ORAL_TABLET | ORAL | 0 refills | Status: DC

## 2018-12-04 MED ORDER — RANITIDINE HCL 150 MG PO TABS: 150.0000 mg | ORAL_TABLET | Freq: Every day | ORAL | 0 refills | Status: DC

## 2018-12-04 MED ORDER — ALBUTEROL SULFATE HFA 108 (90 BASE) MCG/ACT IN AERS: INHALATION_SPRAY | RESPIRATORY_TRACT | 2 refills | Status: DC

## 2018-12-04 MED ORDER — AMLODIPINE BESYLATE 5 MG PO TABS: 5.0000 mg | ORAL_TABLET | Freq: Every day | ORAL | 3 refills | Status: DC

## 2018-12-04 MED ORDER — BUPROPION HCL ER (XL) 150 MG PO TB24: 150.0000 mg | ORAL_TABLET | Freq: Every day | ORAL | 0 refills | Status: DC

## 2018-12-04 MED ORDER — PREDNISONE 20 MG PO TABS
60.0000 mg | ORAL_TABLET | Freq: Once | ORAL | Status: AC
  Administered 2018-12-04: 60 mg via ORAL
  Filled 2018-12-04: qty 3

## 2018-12-04 MED FILL — AMLODIPINE BESYLATE 5 MG TA: 5 | 30 days supply | Qty: 30 | Fill #0

## 2018-12-04 MED FILL — predniSONE 10 MG TABS: 10 | 12 days supply | Qty: 30 | Fill #0

## 2018-12-04 MED FILL — buPROPion HCL ER (XL) 150 M: 150 | 30 days supply | Qty: 30 | Fill #0

## 2018-12-04 MED FILL — FAMOTIDINE 20 MG TABLET: 20 | 30 days supply | Qty: 30 | Fill #0

## 2018-12-04 MED FILL — DOXYCYCLINE HYCLATE 100 MG: 100 | 7 days supply | Qty: 14 | Fill #0

## 2018-12-04 MED FILL — SPIRIVA 18 MCG CP-HANDIHALE: 18 | 30 days supply | Qty: 30 | Fill #0

## 2018-12-04 NOTE — Progress Notes (Signed)
Discharge paperwork discussed with pt at the bedside.  Pt demonstrated understanding. Pt's sister to pick her up. Pt will make staff aware when ready to d/c.

## 2018-12-04 NOTE — Discharge Summary (Signed)
Physician Discharge Summary   Patient ID: Margaret Henry MRN: [id number] DOB/AGE: Feb 05, 1951 68 y.o.  Admit date: 12/02/2018 Discharge date: 12/04/2018  Primary Care Physician:  [name], MD   Recommendations for Outpatient Follow-up:  1. Follow up with PCP in 1-2 weeks 2. Patient restarted on Spiriva, reports that she feels worse with using [place]: None  Equipment/Devices:   Discharge Condition: stable CODE STATUS: FULL or DNR   Diet recommendation: Heart healthy diet   Discharge Diagnoses:    . Acute on chronic respiratory failure with hypoxia ([place]) . Hyperlipidemia . COPD exacerbation ([facility])   Nicotine abuse   Hypertension   Consults: None    Allergies:   Allergies  Allergen Reactions  . Hydrocodone Nausea And Vomiting  . Propoxyphene N-Acetaminophen Nausea And Vomiting     DISCHARGE MEDICATIONS: Allergies as of 12/04/2018      Reactions   Hydrocodone Nausea And Vomiting   Propoxyphene N-acetaminophen Nausea And Vomiting      Medication List    STOP taking these medications   umeclidinium bromide 62.5 MCG/INH Aepb Commonly known as:  INCRUSE ELLIPTA     TAKE these medications   acetaminophen 500 MG tablet Commonly known as:  TYLENOL Take 1,500 mg by mouth every 4 (four) hours as needed for headache.   albuterol (2.5 MG/3ML) 0.083% nebulizer solution Commonly known as:  PROVENTIL TAKE 3 MLS (2.5 MG TOTAL) BY NEBULIZATION EVERY 6 (SIX) HOURS AS NEEDED FOR WHEEZING OR SHORTNESS OF BREATH. What changed:  Another medication with the same name was changed. Make sure you understand how and when to take each.   albuterol 108 (90 Base) MCG/ACT inhaler Commonly known as:  VENTOLIN HFA INHALE 2 PUFFS INTO THE LUNGS EVERY 4 HOURS AS NEEDED FOR WHEEZING OR SHORTNESS OF BREATH What changed:  See the new instructions.   amLODipine 5 MG tablet Commonly known as:  NORVASC Take 1 tablet (5 mg total) by mouth daily.   aspirin EC  81 MG tablet Take 1 tablet (81 mg total) by mouth daily.   atorvastatin 40 MG tablet Commonly known as:  LIPITOR Take 40 mg by mouth every other day. Notes to patient:  Take as prescribed   buPROPion 150 MG 24 hr tablet Commonly known as:  WELLBUTRIN XL Take 1 tablet (150 mg total) by mouth daily.   doxycycline 100 MG tablet Commonly known as:  VIBRA-TABS Take 1 tablet (100 mg total) by mouth 2 (two) times daily for 7 days.   fluticasone 50 MCG/ACT nasal spray Commonly known as:  FLONASE Place 2 sprays into both nostrils daily.   fluticasone furoate-vilanterol 100-25 MCG/INH Aepb Commonly known as:  BREO ELLIPTA Inhale 1 puff into the lungs daily.   loratadine 10 MG tablet Commonly known as:  CLARITIN TAKE 1 TABLET (10 MG TOTAL) BY MOUTH DAILY.   nicotine 21 mg/24hr patch Commonly known as:  NICODERM CQ - dosed in mg/24 hours Place 1 patch (21 mg total) onto the skin daily.   OXYGEN Inhale 2 L into the lungs. Notes to patient:  Use as prescribed   predniSONE 10 MG tablet Commonly known as:  DELTASONE Take Prednisone 40mg  (4 tabs) x 3 days, then 30mg  (3 tabs) x 3 days, then 20mg  (2 tabs) x 3days, then 10mg  (1 tab) x 3days, then OFF. Start taking on:  December 05, 2018   ranitidine 150 MG tablet Commonly known as:  ZANTAC Take 1 tablet (150 mg total) by mouth at bedtime  for 30 days.   tiotropium 18 MCG inhalation capsule Commonly known as:  SPIRIVA HANDIHALER Place 1 capsule (18 mcg total) into inhaler and inhale daily.        Brief H and P: For complete details please refer to admission H and P, but in brief Margaret Henry a 68 y.o.femalewithhistory of COPD, hyperlipidemia, CAD presents to the ER after patient became suddenly short of breath last night while watching TV. Patient states she is chronically short of breath but acutely got worsened yesterday. Started to wheeze and coughing productive of sputum. Denies chest pain nausea vomiting abdominal pain  or diarrhea. EMS was called and patient was brought to the ER. Patient states her son had influenza last week. Patient also had some sore throat 2 weeks ago. In ED, patient was hypoxic, was placed on BiPAP.  BNP 28.1.  Hospital Course:   Acute on chronic respiratory failure with hypoxia and hypercarbia secondary to COPD exacerbation -Much improved, patient was placed on BiPAP in the ED.  She is now back on 2 L O2 via nasal cannula, she is on 2 L at home.  Patient was placed on IV Solu-Medrol, duo nebs, Pulmicort, IV doxycycline while inpatient. -She had remarkable improvement and transition to oral prednisone, albuterol nebs with Ventolin rescue inhaler, oral doxycycline for 7 days. -Influenza panel negative Patient reports that she feels worse after using Incruse Ellipta, placed back on Spiriva.  Nicotine abuse -Counseled strongly on nicotine cessation, continue nicotine patch, added low-dose Wellbutrin, patient states quit 3 weeks ago however still struggling    Hyperlipidemia -Continue statin  Normocytic anemia -H&H currently stable, follow anemia panel  Hypertension BP readings were elevated during hospitalization, placed on Norvasc 5 mg daily.  Please check BP and adjust dose outpatient.   Day of Discharge S: Feeling close to baseline, no significant wheezing, back on O2 2 L, sats 100%.  BP 127/64 (BP Location: Left Arm)   Pulse 74   Temp 97.8 F (36.6 C) (Oral)   Resp 16   Ht 5\' 4"  (1.626 m)   Wt 42.5 kg   SpO2 100%   BMI 16.08 kg/m   Physical Exam: General: Alert and awake oriented x3 not in any acute distress. HEENT: anicteric sclera, pupils reactive to light and accommodation CVS: S1-S2 clear no murmur rubs or gallops Chest: clear to auscultation bilaterally, no wheezing rales or rhonchi Abdomen: soft nontender, nondistended, normal bowel sounds Extremities: no cyanosis, clubbing or edema noted bilaterally Neuro: Cranial nerves II-XII intact, no focal  neurological deficits   The results of significant diagnostics from this hospitalization (including imaging, microbiology, ancillary and laboratory) are listed below for reference.      Procedures/Studies:  Dg Chest Port 1 View  Result Date: 12/02/2018 CLINICAL DATA:  Shortness of breath, COPD EXAM: PORTABLE CHEST 1 VIEW COMPARISON:  09/30/2018 FINDINGS: There is hyperinflation of the lungs compatible with COPD. Heart and mediastinal contours are within normal limits. No focal opacities or effusions. No acute bony abnormality. IMPRESSION: COPD.  No active disease. Electronically Signed   By: Rolm Baptise M.D.   On: 12/02/2018 23:29      LAB RESULTS: Basic Metabolic Panel: Recent Labs  Lab 12/03/18 0455 12/04/18 0504  NA 140 142  K 3.7 4.4  CL 107 112*  CO2 25 26  GLUCOSE 150* 128*  BUN 10 11  CREATININE 0.52 0.48  CALCIUM 8.8* 8.9   Liver Function Tests: Recent Labs  Lab 12/02/18 2320 12/03/18 0455  AST  16 18  ALT 12 11  ALKPHOS 53 50  BILITOT 0.4 0.4  PROT 7.1 6.5  ALBUMIN 4.3 3.9   No results for input(s): LIPASE, AMYLASE in the last 168 hours. No results for input(s): AMMONIA in the last 168 hours. CBC: Recent Labs  Lab 12/03/18 0455 12/04/18 0504  WBC 9.2 12.9*  NEUTROABS 8.4*  --   HGB 10.8* 10.4*  HCT 34.2* 32.9*  MCV 96.3 97.6  PLT 315 317   Cardiac Enzymes: Recent Labs  Lab 12/02/18 2320 12/03/18 0455  TROPONINI <0.03 <0.03   BNP: Invalid input(s): POCBNP CBG: No results for input(s): GLUCAP in the last 168 hours.    Disposition and Follow-up: Discharge Instructions    Diet - low sodium heart healthy   Complete by:  As directed    Discharge instructions   Complete by:  As directed    Please continue albuterol nebs 4 times a day for next 3 days, then continue 3 times a day.  Follow-up with your pulmonologist/lung doctor within the next 10 to 14 days.   Increase activity slowly   Complete by:  As directed        DISPOSITION:  [place], Enobong, MD. Schedule an appointment as soon as possible for a visit in 2 day(s).   Specialty:  Family Medicine Contact information: [place] [address] [phone]        Marshell Garfinkel, MD. Schedule an appointment as soon as possible for a visit in 2 week(s).   Specialty:  Pulmonary Disease Why:  for hospital follow-up Contact information: [address] [place] [address] [phone]            Time coordinating discharge:  43mins   Signed:   Estill Cotta M.D. [facility] 12/04/2018, 11:11 AM

## 2018-12-04 NOTE — Plan of Care (Signed)

## 2018-12-08 ENCOUNTER — Other Ambulatory Visit: Payer: Self-pay

## 2018-12-08 NOTE — Patient Outreach (Signed)
[facility]) Care Management  12/08/2018  AYALA RIBBLE [birth year] [id number]     Transition of Care Referral  Referral Date: 12/08/2018 Referral Source: [employer] D/C Report Date of Admission: 12/02/2018 Diagnosis: "COPD exacerbation" Date of Discharge: 12/04/2018 [place]: [facility]    Outreach attempt # 1 to patient. Spoke with patient who voices she is "progressing" since returning home. She denies any acute issues or concerns at this time. She states that she still has some SOB at times. Patient voices that she has meds in the home and knows how to manage them. She has not made follow up appts yet but voiced she would do so in the near future. RN CM confirmed that patient has d/c paperwork in the home and knows which MDs she needs to follow up with. She denies any issues with transportation. Patient states she has all her meds in the home and no issues or concerns regarding them and declines med review. She denies any further RN CM [facility] needs or concerns at this time.     Plan: RN CM will close case as no further interventions needed at this time.   Enzo Montgomery, RN,BSN,CCM [facility] Telephonic Care Management Coordinator Direct Phone: [phone] Toll Free: [phone] Fax: [phone]

## 2018-12-10 ENCOUNTER — Ambulatory Visit (INDEPENDENT_AMBULATORY_CARE_PROVIDER_SITE_OTHER): Payer: Medicare HMO | Admitting: Nurse Practitioner

## 2018-12-10 ENCOUNTER — Encounter: Payer: Self-pay | Admitting: Nurse Practitioner

## 2018-12-10 VITALS — BP 112/60 | HR 90 | Ht 64.0 in | Wt 93.2 lb

## 2018-12-10 DIAGNOSIS — J449 Chronic obstructive pulmonary disease, unspecified: Secondary | ICD-10-CM

## 2018-12-10 MED ORDER — TIOTROPIUM BROMIDE MONOHYDRATE 2.5 MCG/ACT IN AERS: 2.0000 | INHALATION_SPRAY | Freq: Every day | RESPIRATORY_TRACT | 0 refills | Status: DC

## 2018-12-10 NOTE — Progress Notes (Signed)
@Patient  ID: Margaret Henry, female    DOB: 1951/01/20, 68 y.o.   MRN: [id number]  Chief Complaint  Patient presents with  . Hospitalization Follow-up    Referring provider: [name], MD  HPI 68 year old with history of COPD GOLD D (CAT score 34, multiple exacerbations) diagnosed in 2014 who is followed by Dr. Vaughan Browner  Tests: CXR 12/02/18 - COPD.  No active disease Screening CT chest 03/07/18- calcified right thyroid nodule.  Severe centrilobular emphysema, bronchial wall thickening.  Previously described 4.8 mm right lower lobe nodule now measures 2.9 mm.  Granulomatous splenic and liver calcification. I reviewed the images personally.  PFTs (07/21/15) FVC 2.06 66%), FEV1 0.93 (39%), F/F 45, DLCO 36% Unable complete pleth. Severe obstructive lung disease, severe diffusion impairment  Spirometry 04/12/16 FVC 1.79 [5 7%), FEV1 0.65 (27%), F/F 36 Very severe obstructive airway disease  OV 12/10/18 - Hospital follow up 12/02/18 - 12/04/18 Patient presents today for hospital follow-up.  She was admitted to the hospital on 12/02/2018 for acute on chronic respiratory failure with hypoxia and hypercarbia secondary to COPD exacerbation.  She was treated with IV Solu-Medrol, duo nebs, Pulmicort, and IV doxycycline. She was discharged home with oral prednisone, albuterol neb treatments, and oral doxycycline.  She was also started on Wellbutrin and nicotine patch for smoking cessation.  Patient had reported that her Incruse Ellipta inhaler was not working and that she felt worse when using it.  She was placed back on Spiriva at hospital discharge.  She has been using Breo and Spiriva HandiHaler since discharge.  She is still completing her prednisone taper.  States that she does feel much improved.  He is on 2 L O2 continuous which she was on before she went to the hospital.  States that she is back to her baseline at this point.  She states that she has been able to quit smoking.  She has felt  much improved since she has quit smoking.  She denies any recent fever, shortness of breath, chest pain, or edema.   Allergies  Allergen Reactions  . Hydrocodone Nausea And Vomiting  . Propoxyphene N-Acetaminophen Nausea And Vomiting    Immunization History  Administered Date(s) Administered  . Influenza Split 07/07/2013, 07/05/2016  . Influenza Whole 11/09/2009  . Influenza,inj,Quad PF,6+ Mos 06/24/2015, 07/03/2017, 06/19/2018  . Pneumococcal Conjugate-13 09/26/2015  . Pneumococcal Polysaccharide-23 10/08/1998, 07/08/2018  . Td 10/08/2002  . Tdap 09/10/2016    Past Medical History:  Diagnosis Date  . Allergy    hayfever  . Bronchitis   . Cancer ([place])    skin cancer on chest  . Colon polyps   . Complication of anesthesia    pt states was given too much during nasal surgery 1989; difficulty getting awake  . COPD (chronic obstructive pulmonary disease) ([place])   . Diverticulitis   . GERD (gastroesophageal reflux disease)    occasional  . Hemorrhoids   . High cholesterol   . Osteoporosis   . Pneumonia   . Shortness of breath dyspnea    with exertion   . Thyroid goiter    bx benign  . Vertigo     Tobacco History: Social History   Tobacco Use  Smoking Status Former Smoker  . Packs/day: 1.50  . Years: 49.00  . Pack years: 73.50  . Types: Cigarettes  . Last attempt to quit: 11/03/2018  . Years since quitting: 0.1  Smokeless Tobacco Never Used  Tobacco Comment   10/03/18 - 2-3 cigarette  Counseling given: Yes Comment: 10/03/18 - 2-3 cigarette   Outpatient Encounter Medications as of 12/10/2018  Medication Sig  . acetaminophen (TYLENOL) 500 MG tablet Take 1,500 mg by mouth every 4 (four) hours as needed for headache.   . albuterol (PROVENTIL) (2.5 MG/3ML) 0.083% nebulizer solution TAKE 3 MLS (2.5 MG TOTAL) BY NEBULIZATION EVERY 6 (SIX) HOURS AS NEEDED FOR WHEEZING OR SHORTNESS OF BREATH.  Marland Kitchen albuterol (VENTOLIN HFA) 108 (90 Base) MCG/ACT inhaler INHALE 2 PUFFS  INTO THE LUNGS EVERY 4 HOURS AS NEEDED FOR WHEEZING OR SHORTNESS OF BREATH  . amLODipine (NORVASC) 5 MG tablet Take 1 tablet (5 mg total) by mouth daily.  Marland Kitchen aspirin EC 81 MG tablet Take 1 tablet (81 mg total) by mouth daily.  Marland Kitchen atorvastatin (LIPITOR) 40 MG tablet Take 40 mg by mouth every other day.  Marland Kitchen buPROPion (WELLBUTRIN XL) 150 MG 24 hr tablet Take 1 tablet (150 mg total) by mouth daily.  Marland Kitchen doxycycline (VIBRA-TABS) 100 MG tablet Take 1 tablet (100 mg total) by mouth 2 (two) times daily for 7 days.  . famotidine (PEPCID) 20 MG tablet Take 1 tablet by mouth daily as needed.  . fluticasone (FLONASE) 50 MCG/ACT nasal spray Place 2 sprays into both nostrils daily.  . fluticasone furoate-vilanterol (BREO ELLIPTA) 100-25 MCG/INH AEPB Inhale 1 puff into the lungs daily.  Marland Kitchen loratadine (CLARITIN) 10 MG tablet TAKE 1 TABLET (10 MG TOTAL) BY MOUTH DAILY.  . nicotine (NICODERM CQ - DOSED IN MG/24 HOURS) 21 mg/24hr patch Place 1 patch (21 mg total) onto the skin daily.  . OXYGEN Inhale 2 L into the lungs.  . predniSONE (DELTASONE) 10 MG tablet Take Prednisone 40mg  (4 tabs) x 3 days, then 30mg  (3 tabs) x 3 days, then 20mg  (2 tabs) x 3days, then 10mg  (1 tab) x 3days, then OFF.  Marland Kitchen tiotropium (SPIRIVA HANDIHALER) 18 MCG inhalation capsule Place 1 capsule (18 mcg total) into inhaler and inhale daily.  . Tiotropium Bromide Monohydrate (SPIRIVA RESPIMAT) 2.5 MCG/ACT AERS Inhale 2 puffs into the lungs daily.  . [DISCONTINUED] ranitidine (ZANTAC) 150 MG tablet Take 1 tablet (150 mg total) by mouth at bedtime for 30 days. (Patient not taking: Reported on 12/10/2018)   No facility-administered encounter medications on file as of 12/10/2018.      Review of Systems  Review of Systems  Constitutional: Negative.  Negative for chills and fever.  HENT: Negative.   Respiratory: Negative for cough, shortness of breath and wheezing.   Cardiovascular: Negative.  Negative for chest pain, palpitations and leg swelling.    Gastrointestinal: Negative.   Allergic/Immunologic: Negative.   Neurological: Negative.   Psychiatric/Behavioral: Negative.        Physical Exam  BP 112/60 (BP Location: Right Arm, Patient Position: Sitting, Cuff Size: Normal)   Pulse 90   Ht 5\' 4"  (1.626 m)   Wt 93 lb 3.2 oz (42.3 kg)   SpO2 96% Comment: on 2L of O2 pulse  BMI 16.00 kg/m   Wt Readings from Last 5 Encounters:  12/10/18 93 lb 3.2 oz (42.3 kg)  12/03/18 93 lb 11.1 oz (42.5 kg)  11/05/18 96 lb 6.4 oz (43.7 kg)  10/22/18 96 lb 6.4 oz (43.7 kg)  10/15/18 97 lb 12.8 oz (44.4 kg)     Physical Exam Vitals signs and nursing note reviewed.  Constitutional:      General: She is not in acute distress.    Appearance: She is well-developed.  Cardiovascular:     Rate and  Rhythm: Normal rate and regular rhythm.  Pulmonary:     Effort: Pulmonary effort is normal. No respiratory distress.     Breath sounds: Normal breath sounds. No wheezing or rhonchi.  Musculoskeletal:        General: No swelling.  Neurological:     Mental Status: She is alert and oriented to person, place, and time.     Imaging: Dg Chest Port 1 View  Result Date: 12/02/2018 CLINICAL DATA:  Shortness of breath, COPD EXAM: PORTABLE CHEST 1 VIEW COMPARISON:  09/30/2018 FINDINGS: There is hyperinflation of the lungs compatible with COPD. Heart and mediastinal contours are within normal limits. No focal opacities or effusions. No acute bony abnormality. IMPRESSION: COPD.  No active disease. Electronically Signed   By: Rolm Baptise M.D.   On: 12/02/2018 23:29     Assessment & Plan:   COPD, group D, by GOLD 2017 classification [facility]) Patient has been doing well since hospital discharge.  She has recently finished home PT.  She refuses pulmonary rehab at this time.  Patient's peak flow was checked in office today and she was not able to generating a peak inspiratory flow to use the Spiriva HandiHaler appropriately.  Will switch her to Spiriva Respimat.   She can continue Breo.  She can continue Proventil as needed.  Patient Instructions  Please complete entire course of prednisone and Doxycycline AS ordered in ED We will trial Spriva respimat - Samples given in office Continue the Breo inhaler Congratulations on quitting smoking.   Make sure you do not pick up the habit again Continue supplemental oxygen  Follow-up: Follow up as scheduled with Dr. Vaughan Browner or sooner if needed       Fenton Foy, NP 12/11/2018

## 2018-12-10 NOTE — Patient Instructions (Signed)
Please complete entire course of prednisone and Doxycycline AS ordered in ED We will trial Spriva respimat - Samples given in office Continue the Breo inhaler Congratulations on quitting smoking.  Make sure you do not pick up the habit again Continue supplemental oxygen  Follow-up: Follow up as scheduled with Dr. Vaughan Browner or sooner if needed

## 2018-12-10 NOTE — Progress Notes (Signed)
Patient ID: Margaret Henry, female   DOB: [birth year], 68 y.o.   MRN: [id number]

## 2018-12-11 ENCOUNTER — Encounter: Payer: Self-pay | Admitting: Nurse Practitioner

## 2018-12-11 NOTE — Assessment & Plan Note (Signed)
Patient has been doing well since hospital discharge.  She has recently finished home PT.  She refuses pulmonary rehab at this time.  Patient's peak flow was checked in office today and she was not able to generating a peak inspiratory flow to use the Spiriva HandiHaler appropriately.  Will switch her to Spiriva Respimat.  She can continue Breo.  She can continue Proventil as needed.  Patient Instructions  Please complete entire course of prednisone and Doxycycline AS ordered in ED We will trial Spriva respimat - Samples given in office Continue the Breo inhaler Congratulations on quitting smoking.   Make sure you do not pick up the habit again Continue supplemental oxygen  Follow-up: Follow up as scheduled with Dr. Vaughan Browner or sooner if needed

## 2018-12-12 ENCOUNTER — Telehealth: Payer: Self-pay | Admitting: Family Medicine

## 2018-12-12 NOTE — Telephone Encounter (Signed)
[facility] called for continuing of care orders

## 2018-12-12 NOTE — Telephone Encounter (Signed)
Margaret Henry was given verbal orders to resume home heath starting on Monday.

## 2018-12-15 ENCOUNTER — Other Ambulatory Visit: Payer: Self-pay | Admitting: Pulmonary Disease

## 2018-12-15 DIAGNOSIS — E041 Nontoxic single thyroid nodule: Secondary | ICD-10-CM | POA: Diagnosis not present

## 2018-12-15 DIAGNOSIS — J9611 Chronic respiratory failure with hypoxia: Secondary | ICD-10-CM | POA: Diagnosis not present

## 2018-12-15 DIAGNOSIS — R6881 Early satiety: Secondary | ICD-10-CM | POA: Diagnosis not present

## 2018-12-15 DIAGNOSIS — L03012 Cellulitis of left finger: Secondary | ICD-10-CM | POA: Diagnosis not present

## 2018-12-15 DIAGNOSIS — J441 Chronic obstructive pulmonary disease with (acute) exacerbation: Secondary | ICD-10-CM | POA: Diagnosis not present

## 2018-12-15 DIAGNOSIS — K5790 Diverticulosis of intestine, part unspecified, without perforation or abscess without bleeding: Secondary | ICD-10-CM | POA: Diagnosis not present

## 2018-12-15 MED FILL — BREO ELLIPTA 100-25 MCG INH: 100-25 | 30 days supply | Qty: 60 | Fill #4

## 2018-12-15 MED FILL — VENTOLIN HFA 90 MCG INHALER: 108 (90 BAS | 17 days supply | Qty: 18 | Fill #1

## 2018-12-16 ENCOUNTER — Telehealth: Payer: Self-pay | Admitting: Family Medicine

## 2018-12-16 NOTE — Telephone Encounter (Signed)
Home health orders were given to [facility] on Friday for home health to resume on 12/15/2018

## 2018-12-16 NOTE — Telephone Encounter (Signed)
New Message   Roselie Awkward a physical therapist from [facility] is calling to request orders for resumption of care following hospitalization for COPD exacerbation. Requesting verbal order for twice a week for two weeks/ Please f/u

## 2018-12-17 MED FILL — LORATADINE 10 MG TABLET: 10 | 100 days supply | Qty: 100 | Fill #0

## 2018-12-18 ENCOUNTER — Other Ambulatory Visit: Payer: Self-pay | Admitting: Family Medicine

## 2018-12-18 DIAGNOSIS — K5790 Diverticulosis of intestine, part unspecified, without perforation or abscess without bleeding: Secondary | ICD-10-CM | POA: Diagnosis not present

## 2018-12-18 DIAGNOSIS — L03012 Cellulitis of left finger: Secondary | ICD-10-CM | POA: Diagnosis not present

## 2018-12-18 DIAGNOSIS — J441 Chronic obstructive pulmonary disease with (acute) exacerbation: Secondary | ICD-10-CM | POA: Diagnosis not present

## 2018-12-18 DIAGNOSIS — J9611 Chronic respiratory failure with hypoxia: Secondary | ICD-10-CM | POA: Diagnosis not present

## 2018-12-18 DIAGNOSIS — E041 Nontoxic single thyroid nodule: Secondary | ICD-10-CM | POA: Diagnosis not present

## 2018-12-18 DIAGNOSIS — J42 Unspecified chronic bronchitis: Secondary | ICD-10-CM

## 2018-12-18 DIAGNOSIS — R6881 Early satiety: Secondary | ICD-10-CM | POA: Diagnosis not present

## 2018-12-22 ENCOUNTER — Telehealth: Payer: Self-pay | Admitting: Family Medicine

## 2018-12-22 DIAGNOSIS — L03012 Cellulitis of left finger: Secondary | ICD-10-CM | POA: Diagnosis not present

## 2018-12-22 DIAGNOSIS — R6881 Early satiety: Secondary | ICD-10-CM | POA: Diagnosis not present

## 2018-12-22 DIAGNOSIS — J9611 Chronic respiratory failure with hypoxia: Secondary | ICD-10-CM | POA: Diagnosis not present

## 2018-12-22 DIAGNOSIS — J441 Chronic obstructive pulmonary disease with (acute) exacerbation: Secondary | ICD-10-CM | POA: Diagnosis not present

## 2018-12-22 DIAGNOSIS — K5790 Diverticulosis of intestine, part unspecified, without perforation or abscess without bleeding: Secondary | ICD-10-CM | POA: Diagnosis not present

## 2018-12-22 DIAGNOSIS — E041 Nontoxic single thyroid nodule: Secondary | ICD-10-CM | POA: Diagnosis not present

## 2018-12-22 MED FILL — ALBUTEROL 0.083% INHAL SOLN: (2.5 MG/3ML | 6 days supply | Qty: 75 | Fill #0

## 2018-12-22 MED FILL — ATORVASTATIN 40 MG TABLET: 40 | 30 days supply | Qty: 30 | Fill #0

## 2018-12-22 NOTE — Telephone Encounter (Signed)
[facility] called with her low bp 92/60 BP and she hasnt taken her BP meds amlodipine since Thursday. It was given when she was discharged from the hospital 2 weeks ago.

## 2018-12-22 NOTE — Telephone Encounter (Signed)
She needs to hold Amodipine and monitor her blood pressure

## 2018-12-23 NOTE — Telephone Encounter (Signed)
Noted  

## 2018-12-23 NOTE — Telephone Encounter (Signed)
Called and informed the patient that she needs to stop the medications. She informed me she had been of the medication for four days. I asked the patient to take her BP it was 102/58.

## 2018-12-24 DIAGNOSIS — J9611 Chronic respiratory failure with hypoxia: Secondary | ICD-10-CM | POA: Diagnosis not present

## 2018-12-24 DIAGNOSIS — J439 Emphysema, unspecified: Secondary | ICD-10-CM | POA: Diagnosis not present

## 2018-12-24 DIAGNOSIS — J42 Unspecified chronic bronchitis: Secondary | ICD-10-CM | POA: Diagnosis not present

## 2018-12-24 DIAGNOSIS — J449 Chronic obstructive pulmonary disease, unspecified: Secondary | ICD-10-CM | POA: Diagnosis not present

## 2018-12-25 DIAGNOSIS — E041 Nontoxic single thyroid nodule: Secondary | ICD-10-CM | POA: Diagnosis not present

## 2018-12-25 DIAGNOSIS — J9611 Chronic respiratory failure with hypoxia: Secondary | ICD-10-CM | POA: Diagnosis not present

## 2018-12-25 DIAGNOSIS — K5790 Diverticulosis of intestine, part unspecified, without perforation or abscess without bleeding: Secondary | ICD-10-CM | POA: Diagnosis not present

## 2018-12-25 DIAGNOSIS — J449 Chronic obstructive pulmonary disease, unspecified: Secondary | ICD-10-CM | POA: Diagnosis not present

## 2018-12-25 DIAGNOSIS — R6881 Early satiety: Secondary | ICD-10-CM | POA: Diagnosis not present

## 2018-12-25 DIAGNOSIS — J439 Emphysema, unspecified: Secondary | ICD-10-CM | POA: Diagnosis not present

## 2018-12-25 DIAGNOSIS — J42 Unspecified chronic bronchitis: Secondary | ICD-10-CM | POA: Diagnosis not present

## 2018-12-25 DIAGNOSIS — J441 Chronic obstructive pulmonary disease with (acute) exacerbation: Secondary | ICD-10-CM | POA: Diagnosis not present

## 2018-12-25 DIAGNOSIS — L03012 Cellulitis of left finger: Secondary | ICD-10-CM | POA: Diagnosis not present

## 2019-01-09 ENCOUNTER — Other Ambulatory Visit: Payer: Self-pay | Admitting: Family Medicine

## 2019-01-09 ENCOUNTER — Other Ambulatory Visit: Payer: Self-pay

## 2019-01-09 DIAGNOSIS — J449 Chronic obstructive pulmonary disease, unspecified: Secondary | ICD-10-CM

## 2019-01-09 DIAGNOSIS — I2510 Atherosclerotic heart disease of native coronary artery without angina pectoris: Secondary | ICD-10-CM

## 2019-01-09 MED ORDER — TIOTROPIUM BROMIDE MONOHYDRATE 2.5 MCG/ACT IN AERS: 2.0000 | INHALATION_SPRAY | Freq: Every day | RESPIRATORY_TRACT | 1 refills | Status: DC

## 2019-01-09 MED FILL — ASPIRIN ADULT LOW STRENGTH: 81 | 90 days supply | Qty: 90 | Fill #0

## 2019-01-09 MED FILL — VENTOLIN HFA 90 MCG INHALER: 108 (90 BAS | 17 days supply | Qty: 18 | Fill #2

## 2019-01-09 MED FILL — FLUTICASONE PROP 50 MCG SPR: 50 | 30 days supply | Qty: 16 | Fill #4

## 2019-01-09 MED FILL — ALBUTEROL 0.083% INHAL SOLN: (2.5 MG/3ML | 6 days supply | Qty: 75 | Fill #1

## 2019-01-09 MED FILL — SPIRIVA RESPIMAT 2.5 MCG IN: 2.5 | 90 days supply | Qty: 12 | Fill #0

## 2019-01-09 MED FILL — BREO ELLIPTA 100-25 MCG INH: 100-25 | 30 days supply | Qty: 60 | Fill #5

## 2019-01-21 ENCOUNTER — Ambulatory Visit: Payer: Medicare HMO | Admitting: Family Medicine

## 2019-01-24 DIAGNOSIS — J449 Chronic obstructive pulmonary disease, unspecified: Secondary | ICD-10-CM | POA: Diagnosis not present

## 2019-01-24 DIAGNOSIS — J439 Emphysema, unspecified: Secondary | ICD-10-CM | POA: Diagnosis not present

## 2019-01-24 DIAGNOSIS — J9611 Chronic respiratory failure with hypoxia: Secondary | ICD-10-CM | POA: Diagnosis not present

## 2019-01-24 DIAGNOSIS — J42 Unspecified chronic bronchitis: Secondary | ICD-10-CM | POA: Diagnosis not present

## 2019-01-25 DIAGNOSIS — J9611 Chronic respiratory failure with hypoxia: Secondary | ICD-10-CM | POA: Diagnosis not present

## 2019-01-25 DIAGNOSIS — J439 Emphysema, unspecified: Secondary | ICD-10-CM | POA: Diagnosis not present

## 2019-01-25 DIAGNOSIS — J449 Chronic obstructive pulmonary disease, unspecified: Secondary | ICD-10-CM | POA: Diagnosis not present

## 2019-01-25 DIAGNOSIS — J42 Unspecified chronic bronchitis: Secondary | ICD-10-CM | POA: Diagnosis not present

## 2019-01-28 ENCOUNTER — Other Ambulatory Visit: Payer: Self-pay | Admitting: *Deleted

## 2019-01-28 MED ORDER — BUPROPION HCL ER (XL) 150 MG PO TB24: 150.0000 mg | ORAL_TABLET | Freq: Every day | ORAL | 0 refills | Status: DC

## 2019-02-02 MED FILL — ALBUTEROL 0.083% INHAL SOLN: (2.5 MG/3ML | 6 days supply | Qty: 75 | Fill #2

## 2019-02-05 ENCOUNTER — Ambulatory Visit: Payer: Medicare HMO | Admitting: Pulmonary Disease

## 2019-02-05 MED FILL — buPROPion HCL ER (XL) 150 M: 150 | 30 days supply | Qty: 30 | Fill #0

## 2019-02-11 MED FILL — ATORVASTATIN 40 MG TABLET: 40 | 30 days supply | Qty: 30 | Fill #1

## 2019-02-11 MED FILL — BREO ELLIPTA 100-25 MCG INH: 100-25 | 30 days supply | Qty: 60 | Fill #3

## 2019-02-11 MED FILL — ALBUTEROL SULFATE HFA 108 (: 108 (90 BAS | 17 days supply | Qty: 18 | Fill #0

## 2019-02-23 DIAGNOSIS — J9611 Chronic respiratory failure with hypoxia: Secondary | ICD-10-CM | POA: Diagnosis not present

## 2019-02-23 DIAGNOSIS — J439 Emphysema, unspecified: Secondary | ICD-10-CM | POA: Diagnosis not present

## 2019-02-23 DIAGNOSIS — J449 Chronic obstructive pulmonary disease, unspecified: Secondary | ICD-10-CM | POA: Diagnosis not present

## 2019-02-23 DIAGNOSIS — J42 Unspecified chronic bronchitis: Secondary | ICD-10-CM | POA: Diagnosis not present

## 2019-02-24 DIAGNOSIS — J42 Unspecified chronic bronchitis: Secondary | ICD-10-CM | POA: Diagnosis not present

## 2019-02-24 DIAGNOSIS — J449 Chronic obstructive pulmonary disease, unspecified: Secondary | ICD-10-CM | POA: Diagnosis not present

## 2019-02-24 DIAGNOSIS — J9611 Chronic respiratory failure with hypoxia: Secondary | ICD-10-CM | POA: Diagnosis not present

## 2019-02-24 DIAGNOSIS — J439 Emphysema, unspecified: Secondary | ICD-10-CM | POA: Diagnosis not present

## 2019-03-12 ENCOUNTER — Other Ambulatory Visit: Payer: Self-pay | Admitting: Family Medicine

## 2019-03-12 DIAGNOSIS — J42 Unspecified chronic bronchitis: Secondary | ICD-10-CM

## 2019-03-12 MED FILL — ALBUTEROL SUL 2.5 MG/3 ML S: (2.5 MG/3ML | 6 days supply | Qty: 75 | Fill #0

## 2019-03-12 MED FILL — FLUTICASONE PROP 50 MCG SPR: 50 | 30 days supply | Qty: 16 | Fill #5

## 2019-03-12 MED FILL — BREO ELLIPTA 100-25 MCG INH: 100-25 | 30 days supply | Qty: 60 | Fill #0

## 2019-03-12 MED FILL — ALBUTEROL SULFATE HFA 108 (: 108 (90 BAS | 17 days supply | Qty: 18 | Fill #1

## 2019-03-19 ENCOUNTER — Ambulatory Visit (HOSPITAL_BASED_OUTPATIENT_CLINIC_OR_DEPARTMENT_OTHER): Payer: Medicare HMO

## 2019-03-23 ENCOUNTER — Other Ambulatory Visit: Payer: Self-pay

## 2019-03-23 ENCOUNTER — Ambulatory Visit (HOSPITAL_BASED_OUTPATIENT_CLINIC_OR_DEPARTMENT_OTHER)
Admission: RE | Admit: 2019-03-23 | Discharge: 2019-03-23 | Disposition: A | Discharge status: Home / Self Care | Payer: Medicare HMO | Source: Ambulatory Visit | Attending: Acute Care | Admitting: Acute Care

## 2019-03-23 DIAGNOSIS — Z87891 Personal history of nicotine dependence: Secondary | ICD-10-CM | POA: Diagnosis not present

## 2019-03-23 DIAGNOSIS — Z122 Encounter for screening for malignant neoplasm of respiratory organs: Secondary | ICD-10-CM | POA: Insufficient documentation

## 2019-03-23 DIAGNOSIS — F17210 Nicotine dependence, cigarettes, uncomplicated: Secondary | ICD-10-CM | POA: Diagnosis not present

## 2019-03-25 ENCOUNTER — Ambulatory Visit: Payer: Medicare HMO | Admitting: Family Medicine

## 2019-03-26 ENCOUNTER — Ambulatory Visit: Payer: Medicare HMO | Admitting: Family Medicine

## 2019-03-26 DIAGNOSIS — J42 Unspecified chronic bronchitis: Secondary | ICD-10-CM | POA: Diagnosis not present

## 2019-03-26 DIAGNOSIS — J439 Emphysema, unspecified: Secondary | ICD-10-CM | POA: Diagnosis not present

## 2019-03-26 DIAGNOSIS — J9611 Chronic respiratory failure with hypoxia: Secondary | ICD-10-CM | POA: Diagnosis not present

## 2019-03-26 DIAGNOSIS — J449 Chronic obstructive pulmonary disease, unspecified: Secondary | ICD-10-CM | POA: Diagnosis not present

## 2019-03-27 ENCOUNTER — Other Ambulatory Visit: Payer: Self-pay | Admitting: Acute Care

## 2019-03-27 ENCOUNTER — Telehealth: Payer: Self-pay | Admitting: Acute Care

## 2019-03-27 DIAGNOSIS — J449 Chronic obstructive pulmonary disease, unspecified: Secondary | ICD-10-CM | POA: Diagnosis not present

## 2019-03-27 DIAGNOSIS — J9611 Chronic respiratory failure with hypoxia: Secondary | ICD-10-CM | POA: Diagnosis not present

## 2019-03-27 DIAGNOSIS — J439 Emphysema, unspecified: Secondary | ICD-10-CM | POA: Diagnosis not present

## 2019-03-27 DIAGNOSIS — R918 Other nonspecific abnormal finding of lung field: Secondary | ICD-10-CM

## 2019-03-27 DIAGNOSIS — J42 Unspecified chronic bronchitis: Secondary | ICD-10-CM | POA: Diagnosis not present

## 2019-03-27 NOTE — Telephone Encounter (Signed)
I have called the patient with the results of her low dose CT. I explained that her scan was read as a Lung RADS 4 B indicates suspicious findings for which additional diagnostic testing and or tissue sampling is recommended. I have spoken with Dr. Vaughan Browner who is the patient's primary pulmonologist. He is in agreement with a PET and PFT's ( last PFT's  were done 2016).I have placed the prders. The patient understands that she will receive a call regarding scheduling both. The patient verbalized understanding and had no further questions at completion of the call. She has oyr contact information in the event she has additional questions in the near future. Langley Gauss, please fax results to PCP and let them know we have scheduled follow up imaging. Thanks

## 2019-03-30 ENCOUNTER — Ambulatory Visit: Payer: Medicare HMO | Attending: Family Medicine | Admitting: Family Medicine

## 2019-03-30 ENCOUNTER — Other Ambulatory Visit: Payer: Self-pay

## 2019-03-30 ENCOUNTER — Encounter: Payer: Self-pay | Admitting: Family Medicine

## 2019-03-30 DIAGNOSIS — E782 Mixed hyperlipidemia: Secondary | ICD-10-CM | POA: Diagnosis not present

## 2019-03-30 DIAGNOSIS — D649 Anemia, unspecified: Secondary | ICD-10-CM

## 2019-03-30 DIAGNOSIS — J9611 Chronic respiratory failure with hypoxia: Secondary | ICD-10-CM

## 2019-03-30 DIAGNOSIS — J449 Chronic obstructive pulmonary disease, unspecified: Secondary | ICD-10-CM

## 2019-03-30 MED ORDER — ATORVASTATIN CALCIUM 40 MG PO TABS: 40.0000 mg | ORAL_TABLET | Freq: Every day | ORAL | 3 refills | Status: DC

## 2019-03-30 MED ORDER — PREDNISONE 20 MG PO TABS: 20.0000 mg | ORAL_TABLET | Freq: Two times a day (BID) | ORAL | 0 refills | Status: DC

## 2019-03-30 MED FILL — ATORVASTATIN 40 MG TABLET: 40 | 30 days supply | Qty: 30 | Fill #0

## 2019-03-30 MED FILL — predniSONE 20 MG TABS: 20 | 5 days supply | Qty: 10 | Fill #0

## 2019-03-30 NOTE — Progress Notes (Signed)
Virtual Visit via Telephone Note  I connected with Margaret Henry, on 03/30/2019 at 10:06 AM by telephone due to the COVID-19 pandemic and verified that I am speaking with the correct person using two identifiers.   Consent: I discussed the limitations, risks, security and privacy concerns of performing an evaluation and management service by telephone and the availability of in person appointments. I also discussed with the patient that there may be a patient responsible charge related to this service. The patient expressed understanding and agreed to proceed.   Location of Patient: Home  Location of Provider: Clinic   Persons participating in Telemedicine visit: Ramsha Lonigro Farrington-CMA Dr. Felecia [place]ing     History of Present Illness: Margaret Henry is a 68 year old woman with a history of COPD, tobacco abuse, GERD, hyperlipidemia, chronic respiratory failure (of 2 L of oxygen at rest) who presents today for a follow-up visit. She complains of worsening dyspnea which she described as needing to use 2 L of oxygen all day while previously she could take off her oxygen for some time.  She complains her breathing treatments are not getting into her lungs.  She has a chronic cough, denies wheezing.  She quit smoking 4 months ago and has been compliant with her inhalers.  Denies fever, wheezing or contact with persons suspected of COVID-19. Last visit to pulmonary was in 12/2018. Her most recent CT chest from 03/23/2019 revealed: IMPRESSION: 1. Lung-RADS 4B, suspicious. Additional imaging evaluation or consultation with Pulmonology or Thoracic Surgery recommended. 2. Aortic Atherosclerosis (ICD10-I70.0) and Emphysema (ICD10-J43.9). 3. Coronary artery atherosclerotic calcifications.  She is scheduled for a PET scan later this week and sees pulmonary next week.    Past Medical History:  Diagnosis Date  . Allergy    hayfever  . Bronchitis   . Cancer ([place])    skin cancer on  chest  . Colon polyps   . Complication of anesthesia    pt states was given too much during nasal surgery 1989; difficulty getting awake  . COPD (chronic obstructive pulmonary disease) ([place])   . Diverticulitis   . GERD (gastroesophageal reflux disease)    occasional  . Hemorrhoids   . High cholesterol   . Osteoporosis   . Pneumonia   . Shortness of breath dyspnea    with exertion   . Thyroid goiter    bx benign  . Vertigo    Allergies  Allergen Reactions  . Hydrocodone Nausea And Vomiting  . Propoxyphene N-Acetaminophen Nausea And Vomiting    Current Outpatient Medications on File Prior to Visit  Medication Sig Dispense Refill  . acetaminophen (TYLENOL) 500 MG tablet Take 1,500 mg by mouth every 4 (four) hours as needed for headache.     . albuterol (PROVENTIL) (2.5 MG/3ML) 0.083% nebulizer solution TAKE 3 MLS BY NEBULIZATION EVERY 6 HOURS AS NEEDED FOR WHEEZING OR SHORTNESS OF BREATH. 75 mL 2  . albuterol (VENTOLIN HFA) 108 (90 Base) MCG/ACT inhaler INHALE 2 PUFFS BY MOUTH INTO THE LUNGS EVERY 4 HOURS AS NEEDED FOR WHEEZING OR SHORTNESS OF BREATH 18 g 2  . ASPIRIN ADULT LOW STRENGTH 81 MG EC tablet TAKE 1 TABLET (81 MG TOTAL) BY MOUTH DAILY. 90 tablet 0  . atorvastatin (LIPITOR) 40 MG tablet TAKE 1 TABLET (40 MG TOTAL) BY MOUTH DAILY. 30 tablet 2  . buPROPion (WELLBUTRIN XL) 150 MG 24 hr tablet Take 1 tablet (150 mg total) by mouth daily. 30 tablet 0  . famotidine (PEPCID)  20 MG tablet Take 1 tablet by mouth daily as needed.    . fluticasone (FLONASE) 50 MCG/ACT nasal spray Place 2 sprays into both nostrils daily. 16 g 5  . fluticasone furoate-vilanterol (BREO ELLIPTA) 100-25 MCG/INH AEPB Inhale 1 puff into the lungs daily. 60 each 5  . loratadine (CLARITIN) 10 MG tablet TAKE 1 TABLET (10 MG TOTAL) BY MOUTH DAILY. 100 tablet 2  . nicotine (NICODERM CQ - DOSED IN MG/24 HOURS) 21 mg/24hr patch Place 1 patch (21 mg total) onto the skin daily. 28 patch 0  . OXYGEN Inhale 2 L into  the lungs.    . tiotropium (SPIRIVA HANDIHALER) 18 MCG inhalation capsule Place 1 capsule (18 mcg total) into inhaler and inhale daily. 30 capsule 11  . Tiotropium Bromide Monohydrate (SPIRIVA RESPIMAT) 2.5 MCG/ACT AERS Inhale 2 puffs into the lungs daily. 3 Inhaler 1  . amLODipine (NORVASC) 5 MG tablet Take 1 tablet (5 mg total) by mouth daily. (Patient not taking: Reported on 03/30/2019) 30 tablet 3  . predniSONE (DELTASONE) 10 MG tablet Take Prednisone 40mg  (4 tabs) x 3 days, then 30mg  (3 tabs) x 3 days, then 20mg  (2 tabs) x 3days, then 10mg  (1 tab) x 3days, then OFF. (Patient not taking: Reported on 03/30/2019) 30 tablet 0   No current facility-administered medications on file prior to visit.     Observations/Objective: Awake, alert, oriented x3 Not in acute distress  Assessment and Plan: 1. COPD, group D, by GOLD 2017 classification ([place]) End-stage COPD We will place on short course of prednisone until visit with pulmonary Continue Breo, Proventil - predniSONE (DELTASONE) 20 MG tablet; Take 1 tablet (20 mg total) by mouth 2 (two) times daily with a meal.  Dispense: 10 tablet; Refill: 0  2. Mixed hyperlipidemia Controlled Low-cholesterol diet - atorvastatin (LIPITOR) 40 MG tablet; Take 1 tablet (40 mg total) by mouth daily at 6 PM.  Dispense: 30 tablet; Refill: 3  3. Anemia, unspecified type Last hemoglobin was 10.4 during hospitalization which is down from 11.9 previously-likely dilutional Increase intake of iron rich foods  4. Chronic respiratory failure with hypoxia (HCC) Currently on 2 L of oxygen and I have advised her to increase to 3 L of oxygen If symptoms worsen she is to present to the ED   Follow Up Instructions: Return in about 3 months (around 06/30/2019) for medical conditions.    I discussed the assessment and treatment plan with the patient. The patient was provided an opportunity to ask questions and all were answered. The patient agreed with the plan and  demonstrated an understanding of the instructions.   The patient was advised to call back or seek an in-person evaluation if the symptoms worsen or if the condition fails to improve as anticipated.     I provided 17 minutes total of non-face-to-face time during this encounter including median intraservice time, reviewing previous notes, labs, imaging, medications, management and patient verbalized understanding.     [name], MD, FAAFP. [facility] and [place], [place]   03/30/2019, 10:06 AM

## 2019-03-30 NOTE — Progress Notes (Signed)
Patient has been called and DOB has been verified. Patient has been screened and transferred to PCP to start phone visit.     

## 2019-03-31 NOTE — Telephone Encounter (Signed)
Results faxed to PCP with note that we are following up with PFT's and PET scan for pt.

## 2019-04-01 ENCOUNTER — Telehealth (INDEPENDENT_AMBULATORY_CARE_PROVIDER_SITE_OTHER): Payer: Self-pay | Admitting: Acute Care

## 2019-04-03 ENCOUNTER — Telehealth: Payer: Self-pay | Admitting: Nurse Practitioner

## 2019-04-03 ENCOUNTER — Encounter (HOSPITAL_COMMUNITY)
Admission: RE | Admit: 2019-04-03 | Discharge: 2019-04-03 | Disposition: A | Discharge status: Home / Self Care | Payer: Medicare HMO | Source: Ambulatory Visit | Attending: Acute Care | Admitting: Acute Care

## 2019-04-03 ENCOUNTER — Ambulatory Visit (HOSPITAL_COMMUNITY): Payer: Medicare HMO

## 2019-04-03 ENCOUNTER — Telehealth: Payer: Self-pay | Admitting: Acute Care

## 2019-04-03 ENCOUNTER — Other Ambulatory Visit: Payer: Self-pay

## 2019-04-03 DIAGNOSIS — Z87891 Personal history of nicotine dependence: Secondary | ICD-10-CM

## 2019-04-03 DIAGNOSIS — I2510 Atherosclerotic heart disease of native coronary artery without angina pectoris: Secondary | ICD-10-CM | POA: Insufficient documentation

## 2019-04-03 DIAGNOSIS — R918 Other nonspecific abnormal finding of lung field: Secondary | ICD-10-CM | POA: Diagnosis not present

## 2019-04-03 DIAGNOSIS — R911 Solitary pulmonary nodule: Secondary | ICD-10-CM | POA: Insufficient documentation

## 2019-04-03 DIAGNOSIS — J439 Emphysema, unspecified: Secondary | ICD-10-CM | POA: Diagnosis not present

## 2019-04-03 DIAGNOSIS — Z122 Encounter for screening for malignant neoplasm of respiratory organs: Secondary | ICD-10-CM

## 2019-04-03 LAB — GLUCOSE, CAPILLARY: Glucose-Capillary: 88 mg/dL (ref 70–99)

## 2019-04-03 MED ORDER — FLUDEOXYGLUCOSE F - 18 (FDG) INJECTION
5.8000 | Freq: Once | INTRAVENOUS | Status: AC | PRN
  Administered 2019-04-03: 5.8 via INTRAVENOUS

## 2019-04-03 NOTE — Telephone Encounter (Signed)
Pt has an appointment with Tonya on 04/07/2019 for a video visit. There has been documentation placed in the appointment notes that the pt will need to be called prior to her appointment to help her set up the video visit. Message will be closed.

## 2019-04-03 NOTE — Telephone Encounter (Signed)
I have called the patient with the results of her PET scan. I explained that her scan did not show any significant  metabolic activity which was reassuring. I also reviewed the scan with Dr. Valeta Harms who agreed that we will do a 6 month follow up scan to ensure stability of the area in the left lobe under scrutiny. She verbalized understanding. In addition the patient requested that her 04/07/2019 OV be converted to a video visit, as she is anxious about coming to the office with [place]. I have called the office, and the OV has been converted to a video visit.Marland Kitchen   Denise, please place order for 6 month follow up scan. Also, please send results to the patient's PCP, and let them know we will do a follow up in 6 months. Thanks so much

## 2019-04-07 ENCOUNTER — Other Ambulatory Visit: Payer: Self-pay

## 2019-04-07 ENCOUNTER — Ambulatory Visit (INDEPENDENT_AMBULATORY_CARE_PROVIDER_SITE_OTHER): Payer: Medicare HMO | Admitting: Nurse Practitioner

## 2019-04-07 DIAGNOSIS — J449 Chronic obstructive pulmonary disease, unspecified: Secondary | ICD-10-CM

## 2019-04-07 MED ORDER — SPIRIVA RESPIMAT 2.5 MCG/ACT IN AERS: 2.0000 | INHALATION_SPRAY | Freq: Every day | RESPIRATORY_TRACT | 0 refills | Status: DC

## 2019-04-07 MED ORDER — BREO ELLIPTA 200-25 MCG/INH IN AEPB: 1.0000 | INHALATION_SPRAY | Freq: Every day | RESPIRATORY_TRACT | 0 refills | Status: DC

## 2019-04-07 MED FILL — ALBUTEROL SUL 2.5 MG/3 ML S: (2.5 MG/3ML | 6 days supply | Qty: 75 | Fill #1

## 2019-04-07 MED FILL — SPIRIVA RESPIMAT 2.5 MCG IN: 2.5 | 30 days supply | Qty: 4 | Fill #0

## 2019-04-07 MED FILL — BREO ELLIPTA 200-25 MCG INH: 200-25 | 30 days supply | Qty: 60 | Fill #0

## 2019-04-07 NOTE — Telephone Encounter (Signed)
Copy of PET faxed to PCP.  Order placed for 6 mth f/u Chest CT.

## 2019-04-07 NOTE — Progress Notes (Signed)
Virtual Visit via Telephone Note  I connected with Margaret Henry on 04/08/19 at 11:00 AM EDT by telephone and verified that I am speaking with the correct person using two identifiers.  Location: Patient: home Provider: office   I discussed the limitations, risks, security and privacy concerns of performing an evaluation and management service by telephone and the availability of in person appointments. I also discussed with the patient that there may be a patient responsible charge related to this service. The patient expressed understanding and agreed to proceed.   History of Present Illness: 68 year old with history of COPD GOLD D (CAT score34, multiple exacerbations) diagnosed in 2014 who is followed by Dr. Vaughan Browner  Patient has a tele-visit today for a follow-up visit.  States that over the last month she has noticed increased shortness of breath with exertion.  She was just treated for COPD exacerbation by her PCP and just completed a round of prednisone yesterday.  States that this did help she has not had to use her rescue inhaler any over the past 3 days.  She does continue to use Breo.  Patient is on 2 L of O2 at night and with exertion during the day.  Patient states that she has not smoked in the past 4 months.  She did follow-up with [facility] for lung cancer screening.  She did have to get a PET scan based off initial pulmonary nodule seen on CT for lung cancer screening.  PET scan showed reduced conspicuity of left lower lobe nodule and recommended surveillance screening CT.  A follow-up CT scan has already been scheduled in 6 months. Denies f/c/s, n/v/d, hemoptysis, PND, leg swelling.      Observations/Objective:  PET 04/03/19 - Reduced conspicuity of the left lower lobe pulmonary nodule which now has a more bandlike configuration. This lesion has no discernible accentuated metabolic activity, but an average size is now below sensitive size thresholds. Surveillance with screening chest  CT is likely warranted. Other imaging findings of potential clinical significance: Aortic Atherosclerosis (ICD10-I70.0). Emphysema (ICD10-J43.9). Coronary atherosclerosis. Pelvic floor laxity. Old granulomatous disease.  CT lung Cancer screening 04/03/19 - Lung-RADS 4B, suspicious. Additional imaging evaluation or consultation with Pulmonology or Thoracic Surgery recommended. Emphysema   CXR 12/02/18 - COPD. No active disease Screening CT chest 03/07/18- calcified right thyroid nodule. Severe centrilobular emphysema, bronchial wall thickening. Previously described 4.8 mm right lower lobe nodule now measures 2.9 mm. Granulomatous splenic and liver calcification. I reviewed the images personally.  PFTs (07/21/15) FVC 2.06 66%), FEV1 0.93 (39%), F/F 45, DLCO 36% Unable complete pleth. Severe obstructive lung disease, severe diffusion impairment  Spirometry 04/12/16 FVC 1.79 [5 7%), FEV1 0.65 (27%), F/F 36 Very severe obstructive airway disease  Assessment and Plan: Patient has a tele-visit today for a follow-up visit.  States that over the last month she has noticed increased shortness of breath with exertion.  She was just treated for COPD exacerbation by her PCP and just completed a round of prednisone yesterday.  States that this did help she has not had to use her rescue inhaler any over the past 3 days.  She does continue to use Breo.  Patient is on 2 L of O2 at night and with exertion during the day.  Patient states that she has not smoked in the past 4 months.  She did follow-up with [facility] for lung cancer screening.  She did have to get a PET scan based off initial pulmonary nodule seen on CT for lung  cancer screening.  PET scan showed reduced conspicuity of left lower lobe nodule and recommended surveillance screening CT.  A follow-up CT scan has already been scheduled in 6 months.  Patient Instructions  COPD: Will increase Breo to 200-25 daily Continue spiriva Continue proventil as  needed  Chronic respiratory failure: Continue O2 at 2 L at night and as needed with exertion to keep O2 sats above 90%    Follow Up Instructions:  Follow up with Dr. Vaughan Browner in 1-2 months or sooner if needed    I discussed the assessment and treatment plan with the patient. The patient was provided an opportunity to ask questions and all were answered. The patient agreed with the plan and demonstrated an understanding of the instructions.   The patient was advised to call back or seek an in-person evaluation if the symptoms worsen or if the condition fails to improve as anticipated.  I provided 23 minutes of non-face-to-face time during this encounter.   Fenton Foy, NP

## 2019-04-07 NOTE — Addendum Note (Signed)
Addended by: Doroteo Glassman D on: 04/07/2019 08:23 AM   Modules accepted: Orders

## 2019-04-08 ENCOUNTER — Encounter: Payer: Self-pay | Admitting: Nurse Practitioner

## 2019-04-08 NOTE — Assessment & Plan Note (Signed)
Patient has a tele-visit today for a follow-up visit.  States that over the last month she has noticed increased shortness of breath with exertion.  She was just treated for COPD exacerbation by her PCP and just completed a round of prednisone yesterday.  States that this did help she has not had to use her rescue inhaler any over the past 3 days.  She does continue to use Breo.  Patient is on 2 L of O2 at night and with exertion during the day.  Patient states that she has not smoked in the past 4 months.  She did follow-up with [facility] for lung cancer screening.  She did have to get a PET scan based off initial pulmonary nodule seen on CT for lung cancer screening.  PET scan showed reduced conspicuity of left lower lobe nodule and recommended surveillance screening CT.  A follow-up CT scan has already been scheduled in 6 months.  Patient Instructions  COPD: Will increase Breo to 200-25 daily Continue spiriva Continue proventil as needed  Chronic respiratory failure: Continue O2 at 2 L at night and as needed with exertion to keep O2 sats above 90%  Follow up: Follow up with Dr. Vaughan Browner in 1-2 months or sooner if needed

## 2019-04-08 NOTE — Patient Instructions (Addendum)
COPD: Will increase Breo to 200-25 daily Continue spiriva Continue proventil as needed  Chronic respiratory failure: Continue O2 at 2 L at night and as needed with exertion to keep O2 sats above 90%  Follow up: Follow up with Dr. Vaughan Browner in 1-2 months or sooner if needed

## 2019-04-10 MED FILL — BREO ELLIPTA 100-25 MCG INH: 100-25 | 30 days supply | Qty: 60 | Fill #1

## 2019-04-13 ENCOUNTER — Other Ambulatory Visit: Payer: Self-pay | Admitting: Acute Care

## 2019-04-13 DIAGNOSIS — R911 Solitary pulmonary nodule: Secondary | ICD-10-CM

## 2019-04-13 MED FILL — ALBUTEROL SUL 2.5 MG/3 ML S: (2.5 MG/3ML | 6 days supply | Qty: 75 | Fill #2

## 2019-04-25 DIAGNOSIS — J42 Unspecified chronic bronchitis: Secondary | ICD-10-CM | POA: Diagnosis not present

## 2019-04-25 DIAGNOSIS — J439 Emphysema, unspecified: Secondary | ICD-10-CM | POA: Diagnosis not present

## 2019-04-25 DIAGNOSIS — J449 Chronic obstructive pulmonary disease, unspecified: Secondary | ICD-10-CM | POA: Diagnosis not present

## 2019-04-25 DIAGNOSIS — J9611 Chronic respiratory failure with hypoxia: Secondary | ICD-10-CM | POA: Diagnosis not present

## 2019-04-26 DIAGNOSIS — J9611 Chronic respiratory failure with hypoxia: Secondary | ICD-10-CM | POA: Diagnosis not present

## 2019-04-26 DIAGNOSIS — J449 Chronic obstructive pulmonary disease, unspecified: Secondary | ICD-10-CM | POA: Diagnosis not present

## 2019-04-26 DIAGNOSIS — J42 Unspecified chronic bronchitis: Secondary | ICD-10-CM | POA: Diagnosis not present

## 2019-04-26 DIAGNOSIS — J439 Emphysema, unspecified: Secondary | ICD-10-CM | POA: Diagnosis not present

## 2019-04-27 ENCOUNTER — Other Ambulatory Visit: Payer: Self-pay | Admitting: Family Medicine

## 2019-04-27 DIAGNOSIS — J3089 Other allergic rhinitis: Secondary | ICD-10-CM

## 2019-04-27 MED FILL — FLUTICASONE PROP 50 MCG SPR: 50 | 30 days supply | Qty: 16 | Fill #0

## 2019-04-27 MED FILL — ALBUTEROL SULFATE HFA 108 (: 108 (90 BAS | 17 days supply | Qty: 18 | Fill #2

## 2019-05-11 ENCOUNTER — Other Ambulatory Visit: Payer: Self-pay | Admitting: Nurse Practitioner

## 2019-05-11 MED FILL — SPIRIVA RESPIMAT 2.5 MCG IN: 2.5 | 90 days supply | Qty: 12 | Fill #1

## 2019-05-11 MED FILL — BREO ELLIPTA 200-25 MCG INH: 200-25 | 30 days supply | Qty: 60 | Fill #0

## 2019-05-11 MED FILL — ALBUTEROL SUL 2.5 MG/3 ML S: (2.5 MG/3ML | 6 days supply | Qty: 75 | Fill #2

## 2019-05-26 DIAGNOSIS — J449 Chronic obstructive pulmonary disease, unspecified: Secondary | ICD-10-CM | POA: Diagnosis not present

## 2019-05-26 DIAGNOSIS — J42 Unspecified chronic bronchitis: Secondary | ICD-10-CM | POA: Diagnosis not present

## 2019-05-26 DIAGNOSIS — J439 Emphysema, unspecified: Secondary | ICD-10-CM | POA: Diagnosis not present

## 2019-05-26 DIAGNOSIS — J9611 Chronic respiratory failure with hypoxia: Secondary | ICD-10-CM | POA: Diagnosis not present

## 2019-05-27 DIAGNOSIS — J439 Emphysema, unspecified: Secondary | ICD-10-CM | POA: Diagnosis not present

## 2019-05-27 DIAGNOSIS — J42 Unspecified chronic bronchitis: Secondary | ICD-10-CM | POA: Diagnosis not present

## 2019-05-27 DIAGNOSIS — J449 Chronic obstructive pulmonary disease, unspecified: Secondary | ICD-10-CM | POA: Diagnosis not present

## 2019-05-27 DIAGNOSIS — J9611 Chronic respiratory failure with hypoxia: Secondary | ICD-10-CM | POA: Diagnosis not present

## 2019-06-08 ENCOUNTER — Other Ambulatory Visit: Payer: Self-pay | Admitting: Pulmonary Disease

## 2019-06-08 DIAGNOSIS — J449 Chronic obstructive pulmonary disease, unspecified: Secondary | ICD-10-CM

## 2019-06-08 MED FILL — BREO ELLIPTA 200-25 MCG INH: 200-25 | 30 days supply | Qty: 60 | Fill #1

## 2019-06-08 MED FILL — ALBUTEROL SULFATE HFA 108 (: 108 (90 BAS | 25 days supply | Qty: 18 | Fill #0

## 2019-06-08 MED FILL — FLUTICASONE PROP 50 MCG SPR: 50 | 30 days supply | Qty: 16 | Fill #1

## 2019-06-26 DIAGNOSIS — J42 Unspecified chronic bronchitis: Secondary | ICD-10-CM | POA: Diagnosis not present

## 2019-06-26 DIAGNOSIS — J439 Emphysema, unspecified: Secondary | ICD-10-CM | POA: Diagnosis not present

## 2019-06-26 DIAGNOSIS — J9611 Chronic respiratory failure with hypoxia: Secondary | ICD-10-CM | POA: Diagnosis not present

## 2019-06-26 DIAGNOSIS — J449 Chronic obstructive pulmonary disease, unspecified: Secondary | ICD-10-CM | POA: Diagnosis not present

## 2019-06-27 DIAGNOSIS — J439 Emphysema, unspecified: Secondary | ICD-10-CM | POA: Diagnosis not present

## 2019-06-27 DIAGNOSIS — J42 Unspecified chronic bronchitis: Secondary | ICD-10-CM | POA: Diagnosis not present

## 2019-06-27 DIAGNOSIS — J9611 Chronic respiratory failure with hypoxia: Secondary | ICD-10-CM | POA: Diagnosis not present

## 2019-06-27 DIAGNOSIS — J449 Chronic obstructive pulmonary disease, unspecified: Secondary | ICD-10-CM | POA: Diagnosis not present

## 2019-07-06 ENCOUNTER — Other Ambulatory Visit: Payer: Self-pay | Admitting: Nurse Practitioner

## 2019-07-06 ENCOUNTER — Other Ambulatory Visit: Payer: Self-pay | Admitting: Family Medicine

## 2019-07-06 DIAGNOSIS — J42 Unspecified chronic bronchitis: Secondary | ICD-10-CM

## 2019-07-06 MED FILL — ATORVASTATIN 40 MG TABLET: 40 | 30 days supply | Qty: 30 | Fill #1

## 2019-07-06 MED FILL — LORATADINE 10 MG TABLET: 10 | 100 days supply | Qty: 100 | Fill #1

## 2019-07-06 MED FILL — ALBUTEROL SULFATE HFA 108 (: 108 (90 BAS | 25 days supply | Qty: 18 | Fill #1

## 2019-07-07 ENCOUNTER — Ambulatory Visit: Payer: Medicare HMO | Attending: Family Medicine | Admitting: Family Medicine

## 2019-07-07 ENCOUNTER — Encounter: Payer: Self-pay | Admitting: Family Medicine

## 2019-07-07 ENCOUNTER — Telehealth: Payer: Self-pay

## 2019-07-07 ENCOUNTER — Other Ambulatory Visit: Payer: Self-pay

## 2019-07-07 DIAGNOSIS — J9611 Chronic respiratory failure with hypoxia: Secondary | ICD-10-CM | POA: Diagnosis not present

## 2019-07-07 DIAGNOSIS — Z0000 Encounter for general adult medical examination without abnormal findings: Secondary | ICD-10-CM

## 2019-07-07 DIAGNOSIS — J449 Chronic obstructive pulmonary disease, unspecified: Secondary | ICD-10-CM

## 2019-07-07 DIAGNOSIS — E782 Mixed hyperlipidemia: Secondary | ICD-10-CM | POA: Diagnosis not present

## 2019-07-07 DIAGNOSIS — J3089 Other allergic rhinitis: Secondary | ICD-10-CM | POA: Diagnosis not present

## 2019-07-07 MED ORDER — PREDNISONE 20 MG PO TABS: 20.0000 mg | ORAL_TABLET | Freq: Two times a day (BID) | ORAL | 0 refills | Status: DC

## 2019-07-07 MED FILL — predniSONE 20 MG TABS: 20 | 5 days supply | Qty: 10 | Fill #0

## 2019-07-07 NOTE — Telephone Encounter (Addendum)
Call placed to patient to inform her of home health referral and inquire if she has a preference for home health agencies.  She has worked with [facility] in the past.   Call placed to [facility], spoke to [place] who confirmed that they have seen the patient in the past and she requested that it be faxed to them for review before confirming acceptance. Fax # [phone]. Will fax when provider note is completed.

## 2019-07-07 NOTE — Progress Notes (Signed)
Patient has been called and DOB has been verified. Patient has been screened and transferred to PCP to start phone visit.     

## 2019-07-07 NOTE — Progress Notes (Signed)
Virtual Visit via Telephone Note  I connected with Laurence Compton, on 07/07/2019 at 8:40 AM by telephone due to the COVID-19 pandemic and verified that I am speaking with the correct person using two identifiers.   Consent: I discussed the limitations, risks, security and privacy concerns of performing an evaluation and management service by telephone and the availability of in person appointments. I also discussed with the patient that there may be a patient responsible charge related to this service. The patient expressed understanding and agreed to proceed.   Location of Patient: Home  Location of Provider: Clinic   Persons participating in Telemedicine visit: Litsa Cure [place]-CMA Dr. Felecia Shelling     History of Present Illness: Margaret Henry is a 68 year old woman with a history of COPD, tobacco abuse, GERD, hyperlipidemia, chronic respiratory failure (of 2 L of oxygen at rest) who presents today for a follow-up visit. The last 4 days she has had to use her oxygen more often than usual as she previously used it only at night. Her cough has been better lately and she denies wheezing.Denies fever, chest pains. She is not able to check her Oxygen saturation levels. Last seen by Pulmonary in 03/2019, dose of Breo was increased and she is still on Spiriva and Proventil. She has not had a ciggarrette since 10/2018 Compliant with her statin.  Past Medical History:  Diagnosis Date  . Allergy    hayfever  . Bronchitis   . Cancer ([place])    skin cancer on chest  . Colon polyps   . Complication of anesthesia    pt states was given too much during nasal surgery 1989; difficulty getting awake  . COPD (chronic obstructive pulmonary disease) ([place])   . Diverticulitis   . GERD (gastroesophageal reflux disease)    occasional  . Hemorrhoids   . High cholesterol   . Osteoporosis   . Pneumonia   . Shortness of breath dyspnea    with exertion   . Thyroid goiter    bx  benign  . Vertigo    Allergies  Allergen Reactions  . Hydrocodone Nausea And Vomiting  . Propoxyphene N-Acetaminophen Nausea And Vomiting    Current Outpatient Medications on File Prior to Visit  Medication Sig Dispense Refill  . acetaminophen (TYLENOL) 500 MG tablet Take 1,500 mg by mouth every 4 (four) hours as needed for headache.     . albuterol (PROVENTIL) (2.5 MG/3ML) 0.083% nebulizer solution TAKE 3 MLS BY NEBULIZATION EVERY 6 HOURS AS NEEDED FOR WHEEZING OR SHORTNESS OF BREATH. 75 mL 2  . albuterol (VENTOLIN HFA) 108 (90 Base) MCG/ACT inhaler INHALE 2 PUFFS BY MOUTH INTO THE LUNGS EVERY 4 HOURS AS NEEDED FOR WHEEZING OR SHORTNESS OF BREATH 18 g 2  . ASPIRIN ADULT LOW STRENGTH 81 MG EC tablet TAKE 1 TABLET (81 MG TOTAL) BY MOUTH DAILY. 90 tablet 0  . atorvastatin (LIPITOR) 40 MG tablet Take 1 tablet (40 mg total) by mouth daily at 6 PM. 30 tablet 3  . BREO ELLIPTA 200-25 MCG/INH AEPB INHALE 1 PUFF INTO THE LUNGS DAILY. 60 each 1  . buPROPion (WELLBUTRIN XL) 150 MG 24 hr tablet Take 1 tablet (150 mg total) by mouth daily. 30 tablet 0  . famotidine (PEPCID) 20 MG tablet Take 1 tablet by mouth daily as needed.    . fluticasone (FLONASE) 50 MCG/ACT nasal spray PLACE 2 SPRAYS INTO BOTH NOSTRILS DAILY. 16 g 2  . loratadine (CLARITIN) 10 MG tablet  TAKE 1 TABLET (10 MG TOTAL) BY MOUTH DAILY. 100 tablet 2  . OXYGEN Inhale 2 L into the lungs.    . SPIRIVA RESPIMAT 2.5 MCG/ACT AERS INHALE 2 PUFFS INTO THE LUNGS DAILY. 12 g 1  . tiotropium (SPIRIVA HANDIHALER) 18 MCG inhalation capsule Place 1 capsule (18 mcg total) into inhaler and inhale daily. 30 capsule 11  . nicotine (NICODERM CQ - DOSED IN MG/24 HOURS) 21 mg/24hr patch Place 1 patch (21 mg total) onto the skin daily. (Patient not taking: Reported on 07/07/2019) 28 patch 0  . predniSONE (DELTASONE) 20 MG tablet Take 1 tablet (20 mg total) by mouth 2 (two) times daily with a meal. (Patient not taking: Reported on 07/07/2019) 10 tablet 0   No  current facility-administered medications on file prior to visit.     Observations/Objective: AAOx3 Not in acute distress  Assessment and Plan: 1. Mixed hyperlipidemia Compliant with statin Declines coming into the clinic for labs  2. Non-seasonal allergic rhinitis due to other allergic trigger Doing well on antihistamine and nasal spray  3. Chronic respiratory failure with hypoxia (HCC) Continue Oxygen - Ambulatory referral to Home Health  4. COPD, group D, by GOLD 2017 classification ([place]) Exacerbation Placed on short course of Prednisone - predniSONE (DELTASONE) 20 MG tablet; Take 1 tablet (20 mg total) by mouth 2 (two) times daily with a meal.  Dispense: 10 tablet; Refill: 0 - Ambulatory referral to Home Health  5. Healthcare maintenance Declines coming to the clinic for Flu shot   Follow Up Instructions: Return in about 3 months (around 10/06/2019) for Medical conditions.    I discussed the assessment and treatment plan with the patient. The patient was provided an opportunity to ask questions and all were answered. The patient agreed with the plan and demonstrated an understanding of the instructions.   The patient was advised to call back or seek an in-person evaluation if the symptoms worsen or if the condition fails to improve as anticipated.     I provided 19 minutes total of non-face-to-face time during this encounter including median intraservice time, reviewing previous notes, labs, imaging, medications, management and patient verbalized understanding.     [name], MD, FAAFP. [facility] and Parnell Zavala, [place]   07/07/2019, 8:40 AM

## 2019-07-08 NOTE — Telephone Encounter (Signed)
This CM spoke to [place] regarding referral. She thinks they may be able to accept and requested referral be faxed for review.

## 2019-07-09 NOTE — Telephone Encounter (Signed)
Call placed to [facility] and confirmed that referral was accepted.  They have contacted the patient and start of care for skilled nursing is tomorrow - 07/10/2019

## 2019-07-10 ENCOUNTER — Telehealth: Payer: Self-pay | Admitting: Family Medicine

## 2019-07-10 DIAGNOSIS — K219 Gastro-esophageal reflux disease without esophagitis: Secondary | ICD-10-CM | POA: Diagnosis not present

## 2019-07-10 DIAGNOSIS — M810 Age-related osteoporosis without current pathological fracture: Secondary | ICD-10-CM | POA: Diagnosis not present

## 2019-07-10 DIAGNOSIS — J3089 Other allergic rhinitis: Secondary | ICD-10-CM | POA: Diagnosis not present

## 2019-07-10 DIAGNOSIS — K5792 Diverticulitis of intestine, part unspecified, without perforation or abscess without bleeding: Secondary | ICD-10-CM | POA: Diagnosis not present

## 2019-07-10 DIAGNOSIS — J9611 Chronic respiratory failure with hypoxia: Secondary | ICD-10-CM | POA: Diagnosis not present

## 2019-07-10 DIAGNOSIS — E7800 Pure hypercholesterolemia, unspecified: Secondary | ICD-10-CM | POA: Diagnosis not present

## 2019-07-10 DIAGNOSIS — E049 Nontoxic goiter, unspecified: Secondary | ICD-10-CM | POA: Diagnosis not present

## 2019-07-10 DIAGNOSIS — E782 Mixed hyperlipidemia: Secondary | ICD-10-CM | POA: Diagnosis not present

## 2019-07-10 DIAGNOSIS — J441 Chronic obstructive pulmonary disease with (acute) exacerbation: Secondary | ICD-10-CM | POA: Diagnosis not present

## 2019-07-10 NOTE — Telephone Encounter (Signed)
Nira Conn was called and given verbal orders for patient.

## 2019-07-10 NOTE — Telephone Encounter (Signed)
Margaret Henry with [facility] called for verbal orders  1 week 1  2 week 1 1 week 3  Please follow up

## 2019-07-13 ENCOUNTER — Telehealth: Payer: Self-pay | Admitting: Family Medicine

## 2019-07-13 DIAGNOSIS — K219 Gastro-esophageal reflux disease without esophagitis: Secondary | ICD-10-CM | POA: Diagnosis not present

## 2019-07-13 DIAGNOSIS — J3089 Other allergic rhinitis: Secondary | ICD-10-CM | POA: Diagnosis not present

## 2019-07-13 DIAGNOSIS — E7800 Pure hypercholesterolemia, unspecified: Secondary | ICD-10-CM | POA: Diagnosis not present

## 2019-07-13 DIAGNOSIS — M810 Age-related osteoporosis without current pathological fracture: Secondary | ICD-10-CM | POA: Diagnosis not present

## 2019-07-13 DIAGNOSIS — E049 Nontoxic goiter, unspecified: Secondary | ICD-10-CM | POA: Diagnosis not present

## 2019-07-13 DIAGNOSIS — K5792 Diverticulitis of intestine, part unspecified, without perforation or abscess without bleeding: Secondary | ICD-10-CM | POA: Diagnosis not present

## 2019-07-13 DIAGNOSIS — J441 Chronic obstructive pulmonary disease with (acute) exacerbation: Secondary | ICD-10-CM | POA: Diagnosis not present

## 2019-07-13 DIAGNOSIS — J9611 Chronic respiratory failure with hypoxia: Secondary | ICD-10-CM | POA: Diagnosis not present

## 2019-07-13 DIAGNOSIS — E782 Mixed hyperlipidemia: Secondary | ICD-10-CM | POA: Diagnosis not present

## 2019-07-13 NOTE — Telephone Encounter (Signed)
Roselie Awkward was called and given verbal orders for patient.

## 2019-07-13 NOTE — Telephone Encounter (Signed)
Arnold g from [facility] called from [phone] for verbal orders. 1 week 1, 2 weeks 2 ,1 week 2 for pt

## 2019-07-14 ENCOUNTER — Ambulatory Visit: Payer: Medicare HMO | Admitting: Cardiology

## 2019-07-14 DIAGNOSIS — K219 Gastro-esophageal reflux disease without esophagitis: Secondary | ICD-10-CM | POA: Diagnosis not present

## 2019-07-14 DIAGNOSIS — J3089 Other allergic rhinitis: Secondary | ICD-10-CM | POA: Diagnosis not present

## 2019-07-14 DIAGNOSIS — E7800 Pure hypercholesterolemia, unspecified: Secondary | ICD-10-CM | POA: Diagnosis not present

## 2019-07-14 DIAGNOSIS — J9611 Chronic respiratory failure with hypoxia: Secondary | ICD-10-CM | POA: Diagnosis not present

## 2019-07-14 DIAGNOSIS — E782 Mixed hyperlipidemia: Secondary | ICD-10-CM | POA: Diagnosis not present

## 2019-07-14 DIAGNOSIS — J441 Chronic obstructive pulmonary disease with (acute) exacerbation: Secondary | ICD-10-CM | POA: Diagnosis not present

## 2019-07-14 DIAGNOSIS — M810 Age-related osteoporosis without current pathological fracture: Secondary | ICD-10-CM | POA: Diagnosis not present

## 2019-07-14 DIAGNOSIS — E049 Nontoxic goiter, unspecified: Secondary | ICD-10-CM | POA: Diagnosis not present

## 2019-07-14 DIAGNOSIS — K5792 Diverticulitis of intestine, part unspecified, without perforation or abscess without bleeding: Secondary | ICD-10-CM | POA: Diagnosis not present

## 2019-07-15 DIAGNOSIS — M810 Age-related osteoporosis without current pathological fracture: Secondary | ICD-10-CM | POA: Diagnosis not present

## 2019-07-15 DIAGNOSIS — E7800 Pure hypercholesterolemia, unspecified: Secondary | ICD-10-CM | POA: Diagnosis not present

## 2019-07-15 DIAGNOSIS — J9611 Chronic respiratory failure with hypoxia: Secondary | ICD-10-CM | POA: Diagnosis not present

## 2019-07-15 DIAGNOSIS — E049 Nontoxic goiter, unspecified: Secondary | ICD-10-CM | POA: Diagnosis not present

## 2019-07-15 DIAGNOSIS — K5792 Diverticulitis of intestine, part unspecified, without perforation or abscess without bleeding: Secondary | ICD-10-CM | POA: Diagnosis not present

## 2019-07-15 DIAGNOSIS — J441 Chronic obstructive pulmonary disease with (acute) exacerbation: Secondary | ICD-10-CM | POA: Diagnosis not present

## 2019-07-15 DIAGNOSIS — E782 Mixed hyperlipidemia: Secondary | ICD-10-CM | POA: Diagnosis not present

## 2019-07-15 DIAGNOSIS — K219 Gastro-esophageal reflux disease without esophagitis: Secondary | ICD-10-CM | POA: Diagnosis not present

## 2019-07-15 DIAGNOSIS — J3089 Other allergic rhinitis: Secondary | ICD-10-CM | POA: Diagnosis not present

## 2019-07-16 DIAGNOSIS — M810 Age-related osteoporosis without current pathological fracture: Secondary | ICD-10-CM | POA: Diagnosis not present

## 2019-07-16 DIAGNOSIS — K5792 Diverticulitis of intestine, part unspecified, without perforation or abscess without bleeding: Secondary | ICD-10-CM | POA: Diagnosis not present

## 2019-07-16 DIAGNOSIS — E049 Nontoxic goiter, unspecified: Secondary | ICD-10-CM | POA: Diagnosis not present

## 2019-07-16 DIAGNOSIS — E782 Mixed hyperlipidemia: Secondary | ICD-10-CM | POA: Diagnosis not present

## 2019-07-16 DIAGNOSIS — K219 Gastro-esophageal reflux disease without esophagitis: Secondary | ICD-10-CM | POA: Diagnosis not present

## 2019-07-16 DIAGNOSIS — E7800 Pure hypercholesterolemia, unspecified: Secondary | ICD-10-CM | POA: Diagnosis not present

## 2019-07-16 DIAGNOSIS — J9611 Chronic respiratory failure with hypoxia: Secondary | ICD-10-CM | POA: Diagnosis not present

## 2019-07-16 DIAGNOSIS — J441 Chronic obstructive pulmonary disease with (acute) exacerbation: Secondary | ICD-10-CM | POA: Diagnosis not present

## 2019-07-16 DIAGNOSIS — J3089 Other allergic rhinitis: Secondary | ICD-10-CM | POA: Diagnosis not present

## 2019-07-20 DIAGNOSIS — J9611 Chronic respiratory failure with hypoxia: Secondary | ICD-10-CM | POA: Diagnosis not present

## 2019-07-20 DIAGNOSIS — E7800 Pure hypercholesterolemia, unspecified: Secondary | ICD-10-CM | POA: Diagnosis not present

## 2019-07-20 DIAGNOSIS — J441 Chronic obstructive pulmonary disease with (acute) exacerbation: Secondary | ICD-10-CM | POA: Diagnosis not present

## 2019-07-20 DIAGNOSIS — J3089 Other allergic rhinitis: Secondary | ICD-10-CM | POA: Diagnosis not present

## 2019-07-20 DIAGNOSIS — K219 Gastro-esophageal reflux disease without esophagitis: Secondary | ICD-10-CM | POA: Diagnosis not present

## 2019-07-20 DIAGNOSIS — K5792 Diverticulitis of intestine, part unspecified, without perforation or abscess without bleeding: Secondary | ICD-10-CM | POA: Diagnosis not present

## 2019-07-20 DIAGNOSIS — M810 Age-related osteoporosis without current pathological fracture: Secondary | ICD-10-CM | POA: Diagnosis not present

## 2019-07-20 DIAGNOSIS — E049 Nontoxic goiter, unspecified: Secondary | ICD-10-CM | POA: Diagnosis not present

## 2019-07-20 DIAGNOSIS — E782 Mixed hyperlipidemia: Secondary | ICD-10-CM | POA: Diagnosis not present

## 2019-07-20 MED FILL — FLUAD QUADRIVALENT 0.5 ML P: 0.5 | 1 days supply | Qty: 1 | Fill #0

## 2019-07-22 DIAGNOSIS — K219 Gastro-esophageal reflux disease without esophagitis: Secondary | ICD-10-CM | POA: Diagnosis not present

## 2019-07-22 DIAGNOSIS — J441 Chronic obstructive pulmonary disease with (acute) exacerbation: Secondary | ICD-10-CM | POA: Diagnosis not present

## 2019-07-22 DIAGNOSIS — K5792 Diverticulitis of intestine, part unspecified, without perforation or abscess without bleeding: Secondary | ICD-10-CM | POA: Diagnosis not present

## 2019-07-22 DIAGNOSIS — E049 Nontoxic goiter, unspecified: Secondary | ICD-10-CM | POA: Diagnosis not present

## 2019-07-22 DIAGNOSIS — M810 Age-related osteoporosis without current pathological fracture: Secondary | ICD-10-CM | POA: Diagnosis not present

## 2019-07-22 DIAGNOSIS — E782 Mixed hyperlipidemia: Secondary | ICD-10-CM | POA: Diagnosis not present

## 2019-07-22 DIAGNOSIS — E7800 Pure hypercholesterolemia, unspecified: Secondary | ICD-10-CM | POA: Diagnosis not present

## 2019-07-22 DIAGNOSIS — J9611 Chronic respiratory failure with hypoxia: Secondary | ICD-10-CM | POA: Diagnosis not present

## 2019-07-22 DIAGNOSIS — J3089 Other allergic rhinitis: Secondary | ICD-10-CM | POA: Diagnosis not present

## 2019-07-23 DIAGNOSIS — K219 Gastro-esophageal reflux disease without esophagitis: Secondary | ICD-10-CM | POA: Diagnosis not present

## 2019-07-23 DIAGNOSIS — K5792 Diverticulitis of intestine, part unspecified, without perforation or abscess without bleeding: Secondary | ICD-10-CM | POA: Diagnosis not present

## 2019-07-23 DIAGNOSIS — E7800 Pure hypercholesterolemia, unspecified: Secondary | ICD-10-CM | POA: Diagnosis not present

## 2019-07-23 DIAGNOSIS — M810 Age-related osteoporosis without current pathological fracture: Secondary | ICD-10-CM | POA: Diagnosis not present

## 2019-07-23 DIAGNOSIS — J9611 Chronic respiratory failure with hypoxia: Secondary | ICD-10-CM | POA: Diagnosis not present

## 2019-07-23 DIAGNOSIS — J441 Chronic obstructive pulmonary disease with (acute) exacerbation: Secondary | ICD-10-CM | POA: Diagnosis not present

## 2019-07-23 DIAGNOSIS — E782 Mixed hyperlipidemia: Secondary | ICD-10-CM | POA: Diagnosis not present

## 2019-07-23 DIAGNOSIS — E049 Nontoxic goiter, unspecified: Secondary | ICD-10-CM | POA: Diagnosis not present

## 2019-07-23 DIAGNOSIS — J3089 Other allergic rhinitis: Secondary | ICD-10-CM | POA: Diagnosis not present

## 2019-07-26 DIAGNOSIS — J449 Chronic obstructive pulmonary disease, unspecified: Secondary | ICD-10-CM | POA: Diagnosis not present

## 2019-07-26 DIAGNOSIS — J42 Unspecified chronic bronchitis: Secondary | ICD-10-CM | POA: Diagnosis not present

## 2019-07-26 DIAGNOSIS — J9611 Chronic respiratory failure with hypoxia: Secondary | ICD-10-CM | POA: Diagnosis not present

## 2019-07-26 DIAGNOSIS — J439 Emphysema, unspecified: Secondary | ICD-10-CM | POA: Diagnosis not present

## 2019-07-27 DIAGNOSIS — J42 Unspecified chronic bronchitis: Secondary | ICD-10-CM | POA: Diagnosis not present

## 2019-07-27 DIAGNOSIS — J439 Emphysema, unspecified: Secondary | ICD-10-CM | POA: Diagnosis not present

## 2019-07-27 DIAGNOSIS — J449 Chronic obstructive pulmonary disease, unspecified: Secondary | ICD-10-CM | POA: Diagnosis not present

## 2019-07-27 DIAGNOSIS — J9611 Chronic respiratory failure with hypoxia: Secondary | ICD-10-CM | POA: Diagnosis not present

## 2019-07-27 MED FILL — SPIRIVA RESPIMAT 2.5 MCG IN: 2.5 | 90 days supply | Qty: 12 | Fill #0

## 2019-07-28 DIAGNOSIS — K5792 Diverticulitis of intestine, part unspecified, without perforation or abscess without bleeding: Secondary | ICD-10-CM | POA: Diagnosis not present

## 2019-07-28 DIAGNOSIS — J3089 Other allergic rhinitis: Secondary | ICD-10-CM | POA: Diagnosis not present

## 2019-07-28 DIAGNOSIS — E7800 Pure hypercholesterolemia, unspecified: Secondary | ICD-10-CM | POA: Diagnosis not present

## 2019-07-28 DIAGNOSIS — J9611 Chronic respiratory failure with hypoxia: Secondary | ICD-10-CM | POA: Diagnosis not present

## 2019-07-28 DIAGNOSIS — J441 Chronic obstructive pulmonary disease with (acute) exacerbation: Secondary | ICD-10-CM | POA: Diagnosis not present

## 2019-07-28 DIAGNOSIS — E782 Mixed hyperlipidemia: Secondary | ICD-10-CM | POA: Diagnosis not present

## 2019-07-28 DIAGNOSIS — E049 Nontoxic goiter, unspecified: Secondary | ICD-10-CM | POA: Diagnosis not present

## 2019-07-28 DIAGNOSIS — K219 Gastro-esophageal reflux disease without esophagitis: Secondary | ICD-10-CM | POA: Diagnosis not present

## 2019-07-28 DIAGNOSIS — M810 Age-related osteoporosis without current pathological fracture: Secondary | ICD-10-CM | POA: Diagnosis not present

## 2019-07-28 MED FILL — FLUTICASONE PROP 50 MCG SPR: 50 | 30 days supply | Qty: 16 | Fill #2

## 2019-07-29 ENCOUNTER — Telehealth: Payer: Self-pay | Admitting: Family Medicine

## 2019-07-29 DIAGNOSIS — E782 Mixed hyperlipidemia: Secondary | ICD-10-CM | POA: Diagnosis not present

## 2019-07-29 DIAGNOSIS — K219 Gastro-esophageal reflux disease without esophagitis: Secondary | ICD-10-CM | POA: Diagnosis not present

## 2019-07-29 DIAGNOSIS — J3089 Other allergic rhinitis: Secondary | ICD-10-CM | POA: Diagnosis not present

## 2019-07-29 DIAGNOSIS — K5792 Diverticulitis of intestine, part unspecified, without perforation or abscess without bleeding: Secondary | ICD-10-CM | POA: Diagnosis not present

## 2019-07-29 DIAGNOSIS — E7800 Pure hypercholesterolemia, unspecified: Secondary | ICD-10-CM | POA: Diagnosis not present

## 2019-07-29 DIAGNOSIS — J441 Chronic obstructive pulmonary disease with (acute) exacerbation: Secondary | ICD-10-CM | POA: Diagnosis not present

## 2019-07-29 DIAGNOSIS — E049 Nontoxic goiter, unspecified: Secondary | ICD-10-CM | POA: Diagnosis not present

## 2019-07-29 DIAGNOSIS — M810 Age-related osteoporosis without current pathological fracture: Secondary | ICD-10-CM | POA: Diagnosis not present

## 2019-07-29 DIAGNOSIS — J9611 Chronic respiratory failure with hypoxia: Secondary | ICD-10-CM | POA: Diagnosis not present

## 2019-07-29 NOTE — Telephone Encounter (Signed)
Paerwork has been received and will be faxed over once signed by PCP

## 2019-07-29 NOTE — Telephone Encounter (Signed)
Margaret Henry from [facility] called in regards to outstanding orders, she agreed to fax another copy of paperwork needed. Please follow with this plan of care order from 07/10/2019-08/11/2019.  Margaret [phone]

## 2019-08-03 ENCOUNTER — Encounter: Payer: Self-pay | Admitting: Physician Assistant

## 2019-08-03 NOTE — Progress Notes (Signed)
Virtual Visit via Telephone Note   This visit type was conducted due to national recommendations for restrictions regarding the COVID-19 Pandemic (e.g. social distancing) in an effort to limit this patient's exposure and mitigate transmission in our community.  Due to her co-morbid illnesses, this patient is at least at moderate risk for complications without adequate follow up.  This format is felt to be most appropriate for this patient at this time.  The patient did not have access to video technology/had technical difficulties with video requiring transitioning to audio format only (telephone).  All issues noted in this document were discussed and addressed.  No physical exam could be performed with this format.  Please refer to the patient's chart for her  consent to telehealth for [facility].   Date:  08/04/2019   ID:  [name], DOB 06/23/1951, MRN [id number]  Patient Location: Home Provider Location: Home  PCP:  [name], MD  Cardiologist:  Ena Dawley, MD   Electrophysiologist:  None   Evaluation Performed:  Follow-Up Visit  Chief Complaint: f/u  History of Present Illness:    Margaret Henry is a 68 y.o. female with history of over 50-pack-year of smoking, hyperlipidemia and COPD.  Chest CT for lung cancer showed calcification of all 3 coronary arteries as well as borderline size of ascending aorta and severe diffuse calcifications of the aortic root and descending thoracic aorta.  CT was reviewed by Dr. Meda Coffee who felt her asending aorta was within normal limits.  Nuclear stress test 06/2016 was negative for ischemia or prior infarction.  Because of coronary calcification and elevated lipids the patient was started on atorvastatin 40 mill daily.  She also has a strong family history of CAD with her mother dying at 62 of an MI father an MI in his 9s and CABG in his 35s.  2D echo 2018 showed normal LVEF with grade 1 DD.   LOV Dr.Nelson1/05/2019 and COPD had gotten  much worse on O2 and she had lost 18 lbs.  Patient overall doing well. No chest pain, palpitations, or edema. Doesn't go out. Breathing has been bad, wearing O2 24 hrs. O2 sat drops to 79% off O2 and 84% with O2 and exercising. Working with PT to work with her and increase her stamina. No longer smoking!!   The patient does not have symptoms concerning for COVID-19 infection (fever, chills, cough, or new shortness of breath).    Past Medical History:  Diagnosis Date  . Allergy    hayfever  . Bronchitis   . Cancer ([place])    skin cancer on chest  . Colon polyps   . Complication of anesthesia    pt states was given too much during nasal surgery 1989; difficulty getting awake  . COPD (chronic obstructive pulmonary disease) ([place])   . Diverticulitis   . GERD (gastroesophageal reflux disease)    occasional  . Hemorrhoids   . High cholesterol   . Osteoporosis   . Pneumonia   . Shortness of breath dyspnea    with exertion   . Thyroid goiter    bx benign  . Vertigo    Past Surgical History:  Procedure Laterality Date  . ABDOMINAL HYSTERECTOMY    . BILATERAL OOPHORECTOMY  2001   for benign ovarian mass   . BIOPSY THYROID    . DENTAL SURGERY     dentures  . NASAL SEPTUM SURGERY    . PARTIAL HYSTERECTOMY  1976   for heavy menses   .  RECTAL EXAM UNDER ANESTHESIA N/A 12/14/2015   Procedure: RECTAL EXAM UNDER ANESTHESIA REMOVAL OF ANAL CANAL MASS; INTERNAL HEMORRHOID LIGATION, EXTERNAL HEMORRHOID LIGATION;  Surgeon: Michael Boston, MD;  Location: [facility];  Service: General;  Laterality: N/A;  . TUBAL LIGATION    . WISDOM TOOTH EXTRACTION       Current Meds  Medication Sig  . acetaminophen (TYLENOL) 500 MG tablet Take 1,500 mg by mouth every 4 (four) hours as needed for headache.   . albuterol (PROVENTIL) (2.5 MG/3ML) 0.083% nebulizer solution TAKE 3 MLS BY NEBULIZATION EVERY 6 HOURS AS NEEDED FOR WHEEZING OR SHORTNESS OF BREATH.  Marland Kitchen albuterol (VENTOLIN HFA) 108 (90 Base) MCG/ACT inhaler  INHALE 2 PUFFS BY MOUTH INTO THE LUNGS EVERY 4 HOURS AS NEEDED FOR WHEEZING OR SHORTNESS OF BREATH  . ASPIRIN ADULT LOW STRENGTH 81 MG EC tablet TAKE 1 TABLET (81 MG TOTAL) BY MOUTH DAILY.  Marland Kitchen atorvastatin (LIPITOR) 40 MG tablet Take 1 tablet (40 mg total) by mouth daily at 6 PM.  . BREO ELLIPTA 200-25 MCG/INH AEPB INHALE 1 PUFF BY MOUTH INTO THE LUNGS DAILY.  . famotidine (PEPCID) 20 MG tablet Take 1 tablet by mouth daily as needed.  . fluticasone (FLONASE) 50 MCG/ACT nasal spray PLACE 2 SPRAYS INTO BOTH NOSTRILS DAILY.  Marland Kitchen loratadine (CLARITIN) 10 MG tablet TAKE 1 TABLET (10 MG TOTAL) BY MOUTH DAILY.  Marland Kitchen OXYGEN Inhale 2 L into the lungs.  . SPIRIVA RESPIMAT 2.5 MCG/ACT AERS INHALE 2 PUFFS INTO THE LUNGS DAILY.     Allergies:   Hydrocodone and Propoxyphene n-acetaminophen   Social History   Tobacco Use  . Smoking status: Former Smoker    Packs/day: 1.50    Years: 49.00    Pack years: 73.50    Types: Cigarettes    Quit date: 11/03/2018    Years since quitting: 0.7  . Smokeless tobacco: Never Used  . Tobacco comment: 10/03/18 - 2-3 cigarette  Substance Use Topics  . Alcohol use: No    Alcohol/week: 0.0 standard drinks  . Drug use: No     Family Hx: The patient's family history includes COPD in her father; Heart disease in her mother; Liver cancer in her paternal grandmother; Lung cancer in her maternal grandfather. There is no history of Colon cancer, Colon polyps, Esophageal cancer, Rectal cancer, or Stomach cancer.  ROS:   Please see the history of present illness.      All other systems reviewed and are negative.   Prior CV studies:   The following studies were reviewed today:  CT 04-26-2019 IMPRESSION: 1. Lung-RADS 4B, suspicious. Additional imaging evaluation or consultation with Pulmonology or Thoracic Surgery recommended. 2. Aortic Atherosclerosis (ICD10-I70.0) and Emphysema (ICD10-J43.9). 3. Coronary artery atherosclerotic calcifications.      2D echo 05/07/17 Study  Conclusions   - Left ventricle: The cavity size was normal. Wall thickness was   normal. Systolic function was vigorous. The estimated ejection   fraction was in the range of 65% to 70%. Wall motion was normal;   there were no regional wall motion abnormalities. Doppler   parameters are consistent with abnormal left ventricular   relaxation (grade 1 diastolic dysfunction). - Aortic valve: There was mild regurgitation.   Impressions: - Vigorous LV systolic function; mild diastolic dysfunction; aortic   valve not well visualized but appears to be mildly calcified;   mildly elevated velocity of 2.2 m/s likely related to vigorous LV   function; mild AI.    The left ventricular ejection  fraction is hyperdynamic (>65%).  Nuclear stress EF: 74%.  There was no ST segment deviation noted during stress.  The study is normal.  This is a low risk study.   Normal resting and stress perfusion. No ischemia or infarction EF 74%  CT chest without contrast 08/2015 IMPRESSION: 1. Lung-RADS Category 2, benign appearance or behavior. Continue annual screening with low-dose chest CT without contrast in 12 months. Centrilobular emphysema with at least 1 pulmonary nodule. 2.  Atherosclerosis, including within the coronary arteries. 3. Indeterminate partially calcified right-sided thyroid nodule. Consider ultrasound     Labs/Other Tests and Data Reviewed:    EKG:  No ECG reviewed.  Recent Labs: 12/02/2018: B Natriuretic Peptide 28.1 12/03/2018: ALT 11 12/04/2018: BUN 11; Creatinine, Ser 0.48; Hemoglobin 10.4; Platelets 317; Potassium 4.4; Sodium 142   Recent Lipid Panel Lab Results  Component Value Date/Time   CHOL 160 01/06/2018 10:58 AM   TRIG 161 (H) 01/06/2018 10:58 AM   HDL 51 01/06/2018 10:58 AM   CHOLHDL 3.1 01/06/2018 10:58 AM   CHOLHDL 3.1 09/05/2016 12:18 PM   LDLCALC 77 01/06/2018 10:58 AM   LDLDIRECT 166 (H) 09/26/2015 10:01 AM    Wt Readings from Last 3 Encounters:   08/04/19 120 lb (54.4 kg)  12/10/18 93 lb 3.2 oz (42.3 kg)  12/03/18 93 lb 11.1 oz (42.5 kg)     Objective:    Vital Signs:  BP 97/63   Pulse 98   Wt 120 lb (54.4 kg)   BMI 20.60 kg/m    VITAL SIGNS:  reviewed  ASSESSMENT & PLAN:   Coronary arthrosclerosis on CT in 2016 and 03/23/19 Lexiscan Myoview normal 06/2016.  Continue Lipitor every other day and aspirin. No chest pain    Hyperlipidemia continue Lipitor LDL 77 01/2018. Followed by PCP   COPD/emphysema -stopped smoking, followed by pulmonary, however this is end-stage COPD has gained 20 lbs since she quit smoking.   Tobacco abuse stopped smoking 7 months ago   COVID-19 Education: The signs and symptoms of COVID-19 were discussed with the patient and how to seek care for testing (follow up with PCP or arrange E-visit).   The importance of social distancing was discussed today.  Time:   Today, I have spent 9:30 minutes with the patient with telehealth technology discussing the above problems.     Medication Adjustments/Labs and Tests Ordered: Current medicines are reviewed at length with the patient today.  Concerns regarding medicines are outlined above.   Tests Ordered: No orders of the defined types were placed in this encounter.   Medication Changes: No orders of the defined types were placed in this encounter.   Follow Up:  Either In Person or Virtual in 6 month(s) Dr. Meda Coffee  Signed, Ermalinda Barrios, PA-C  08/04/2019 1:45 PM    [place]

## 2019-08-04 ENCOUNTER — Other Ambulatory Visit: Payer: Self-pay

## 2019-08-04 ENCOUNTER — Telehealth (INDEPENDENT_AMBULATORY_CARE_PROVIDER_SITE_OTHER): Payer: Medicare HMO | Admitting: Physician Assistant

## 2019-08-04 ENCOUNTER — Encounter: Payer: Self-pay | Admitting: Physician Assistant

## 2019-08-04 VITALS — BP 97/63 | HR 98 | Wt 120.0 lb

## 2019-08-04 DIAGNOSIS — J9611 Chronic respiratory failure with hypoxia: Secondary | ICD-10-CM | POA: Diagnosis not present

## 2019-08-04 DIAGNOSIS — I2510 Atherosclerotic heart disease of native coronary artery without angina pectoris: Secondary | ICD-10-CM

## 2019-08-04 DIAGNOSIS — K219 Gastro-esophageal reflux disease without esophagitis: Secondary | ICD-10-CM | POA: Diagnosis not present

## 2019-08-04 DIAGNOSIS — E782 Mixed hyperlipidemia: Secondary | ICD-10-CM | POA: Diagnosis not present

## 2019-08-04 DIAGNOSIS — K5792 Diverticulitis of intestine, part unspecified, without perforation or abscess without bleeding: Secondary | ICD-10-CM | POA: Diagnosis not present

## 2019-08-04 DIAGNOSIS — E049 Nontoxic goiter, unspecified: Secondary | ICD-10-CM | POA: Diagnosis not present

## 2019-08-04 DIAGNOSIS — E7800 Pure hypercholesterolemia, unspecified: Secondary | ICD-10-CM | POA: Diagnosis not present

## 2019-08-04 DIAGNOSIS — J3089 Other allergic rhinitis: Secondary | ICD-10-CM | POA: Diagnosis not present

## 2019-08-04 DIAGNOSIS — M810 Age-related osteoporosis without current pathological fracture: Secondary | ICD-10-CM | POA: Diagnosis not present

## 2019-08-04 DIAGNOSIS — J441 Chronic obstructive pulmonary disease with (acute) exacerbation: Secondary | ICD-10-CM | POA: Diagnosis not present

## 2019-08-04 NOTE — Patient Instructions (Signed)
Medication Instructions:  Your physician recommends that you continue on your current medications as directed. Please refer to the Current Medication list given to you today.  *If you need a refill on your cardiac medications before your next appointment, please call your pharmacy*  Lab Work: None ordered  If you have labs (blood work) drawn today and your tests are completely normal, you will receive your results only by: Marland Kitchen MyChart Message (if you have MyChart) OR . A paper copy in the mail If you have any lab test that is abnormal or we need to change your treatment, we will call you to review the results.  Testing/Procedures: None ordered  Follow-Up: At [facility], you and your health needs are our priority.  As part of our continuing mission to provide you with exceptional heart care, we have created designated Provider Care Teams.  These Care Teams include your primary Cardiologist (physician) and Advanced Practice Providers (APPs -  Physician Assistants and Nurse Practitioners) who all work together to provide you with the care you need, when you need it.  Your next appointment:   6 months  The format for your next appointment:   Either In Person or Virtual  Provider:   You may see Ena Dawley, MD or one of the following Advanced Practice Providers on your designated Care Team:    Melina Copa, PA-C  Ermalinda Barrios, PA-C   Other Instructions

## 2019-08-07 DIAGNOSIS — M810 Age-related osteoporosis without current pathological fracture: Secondary | ICD-10-CM | POA: Diagnosis not present

## 2019-08-07 DIAGNOSIS — K219 Gastro-esophageal reflux disease without esophagitis: Secondary | ICD-10-CM | POA: Diagnosis not present

## 2019-08-07 DIAGNOSIS — E7800 Pure hypercholesterolemia, unspecified: Secondary | ICD-10-CM | POA: Diagnosis not present

## 2019-08-07 DIAGNOSIS — E049 Nontoxic goiter, unspecified: Secondary | ICD-10-CM | POA: Diagnosis not present

## 2019-08-07 DIAGNOSIS — J3089 Other allergic rhinitis: Secondary | ICD-10-CM | POA: Diagnosis not present

## 2019-08-07 DIAGNOSIS — K5792 Diverticulitis of intestine, part unspecified, without perforation or abscess without bleeding: Secondary | ICD-10-CM | POA: Diagnosis not present

## 2019-08-07 DIAGNOSIS — E782 Mixed hyperlipidemia: Secondary | ICD-10-CM | POA: Diagnosis not present

## 2019-08-07 DIAGNOSIS — J441 Chronic obstructive pulmonary disease with (acute) exacerbation: Secondary | ICD-10-CM | POA: Diagnosis not present

## 2019-08-07 DIAGNOSIS — J9611 Chronic respiratory failure with hypoxia: Secondary | ICD-10-CM | POA: Diagnosis not present

## 2019-08-10 MED FILL — BREO ELLIPTA 200-25 MCG INH: 200-25 | 30 days supply | Qty: 60 | Fill #1

## 2019-08-11 ENCOUNTER — Telehealth: Payer: Self-pay | Admitting: Family Medicine

## 2019-08-11 DIAGNOSIS — E049 Nontoxic goiter, unspecified: Secondary | ICD-10-CM | POA: Diagnosis not present

## 2019-08-11 DIAGNOSIS — E7800 Pure hypercholesterolemia, unspecified: Secondary | ICD-10-CM | POA: Diagnosis not present

## 2019-08-11 DIAGNOSIS — J9611 Chronic respiratory failure with hypoxia: Secondary | ICD-10-CM | POA: Diagnosis not present

## 2019-08-11 DIAGNOSIS — M810 Age-related osteoporosis without current pathological fracture: Secondary | ICD-10-CM | POA: Diagnosis not present

## 2019-08-11 DIAGNOSIS — K5792 Diverticulitis of intestine, part unspecified, without perforation or abscess without bleeding: Secondary | ICD-10-CM | POA: Diagnosis not present

## 2019-08-11 DIAGNOSIS — E782 Mixed hyperlipidemia: Secondary | ICD-10-CM | POA: Diagnosis not present

## 2019-08-11 DIAGNOSIS — K219 Gastro-esophageal reflux disease without esophagitis: Secondary | ICD-10-CM | POA: Diagnosis not present

## 2019-08-11 DIAGNOSIS — J441 Chronic obstructive pulmonary disease with (acute) exacerbation: Secondary | ICD-10-CM | POA: Diagnosis not present

## 2019-08-11 DIAGNOSIS — J3089 Other allergic rhinitis: Secondary | ICD-10-CM | POA: Diagnosis not present

## 2019-08-11 NOTE — Telephone Encounter (Signed)
RN Agapito Games from [facility] called to request verbal orders to have patients lipid panel checked per cardiologist, appointment was denied as pt is not willing to come to the clinic at this time due to her chronic respiratory issues. Please follow up.  [phone]

## 2019-08-11 NOTE — Telephone Encounter (Signed)
Please advise to get in touch with cardiologist who requested orders.  Thank you

## 2019-08-11 NOTE — Telephone Encounter (Signed)
Ok to give orders for Lipid panel

## 2019-08-12 NOTE — Telephone Encounter (Signed)
I do not have a problem ordering labs but she needs to have them drawn here in the clinic to have them in epic.  If home health orders are needed for labs as per Cardiology, they have to come from her cardiologist.

## 2019-08-12 NOTE — Telephone Encounter (Signed)
Patient was called and she states that she was informed by her heart doctor to get labs drawn from PCP.  Patient states that she does not want to come in to clinic to get labs drawn.

## 2019-08-13 NOTE — Telephone Encounter (Signed)
Patient was called and informed to get in touch with her heart doctor to get orders.  Patient states that she does not want to come into the clinic.

## 2019-08-18 DIAGNOSIS — J3089 Other allergic rhinitis: Secondary | ICD-10-CM | POA: Diagnosis not present

## 2019-08-18 DIAGNOSIS — K219 Gastro-esophageal reflux disease without esophagitis: Secondary | ICD-10-CM | POA: Diagnosis not present

## 2019-08-18 DIAGNOSIS — J9611 Chronic respiratory failure with hypoxia: Secondary | ICD-10-CM | POA: Diagnosis not present

## 2019-08-18 DIAGNOSIS — E049 Nontoxic goiter, unspecified: Secondary | ICD-10-CM | POA: Diagnosis not present

## 2019-08-18 DIAGNOSIS — E782 Mixed hyperlipidemia: Secondary | ICD-10-CM | POA: Diagnosis not present

## 2019-08-18 DIAGNOSIS — M810 Age-related osteoporosis without current pathological fracture: Secondary | ICD-10-CM | POA: Diagnosis not present

## 2019-08-18 DIAGNOSIS — J441 Chronic obstructive pulmonary disease with (acute) exacerbation: Secondary | ICD-10-CM | POA: Diagnosis not present

## 2019-08-18 DIAGNOSIS — E7800 Pure hypercholesterolemia, unspecified: Secondary | ICD-10-CM | POA: Diagnosis not present

## 2019-08-18 DIAGNOSIS — K5792 Diverticulitis of intestine, part unspecified, without perforation or abscess without bleeding: Secondary | ICD-10-CM | POA: Diagnosis not present

## 2019-08-26 DIAGNOSIS — J9611 Chronic respiratory failure with hypoxia: Secondary | ICD-10-CM | POA: Diagnosis not present

## 2019-08-26 DIAGNOSIS — J449 Chronic obstructive pulmonary disease, unspecified: Secondary | ICD-10-CM | POA: Diagnosis not present

## 2019-08-26 DIAGNOSIS — J439 Emphysema, unspecified: Secondary | ICD-10-CM | POA: Diagnosis not present

## 2019-08-26 DIAGNOSIS — J42 Unspecified chronic bronchitis: Secondary | ICD-10-CM | POA: Diagnosis not present

## 2019-08-27 DIAGNOSIS — J439 Emphysema, unspecified: Secondary | ICD-10-CM | POA: Diagnosis not present

## 2019-08-27 DIAGNOSIS — J9611 Chronic respiratory failure with hypoxia: Secondary | ICD-10-CM | POA: Diagnosis not present

## 2019-08-27 DIAGNOSIS — J449 Chronic obstructive pulmonary disease, unspecified: Secondary | ICD-10-CM | POA: Diagnosis not present

## 2019-08-27 DIAGNOSIS — J42 Unspecified chronic bronchitis: Secondary | ICD-10-CM | POA: Diagnosis not present

## 2019-08-27 MED FILL — ATORVASTATIN 40 MG TABLET: 40 | 30 days supply | Qty: 30 | Fill #2

## 2019-08-27 MED FILL — ALBUTEROL SUL 2.5 MG/3 ML S: (2.5 MG/3ML | 7 days supply | Qty: 75 | Fill #1

## 2019-09-07 ENCOUNTER — Other Ambulatory Visit: Payer: Self-pay | Admitting: Pulmonary Disease

## 2019-09-07 ENCOUNTER — Telehealth: Payer: Self-pay | Admitting: Cardiology

## 2019-09-07 DIAGNOSIS — E782 Mixed hyperlipidemia: Secondary | ICD-10-CM | POA: Diagnosis not present

## 2019-09-07 DIAGNOSIS — J9611 Chronic respiratory failure with hypoxia: Secondary | ICD-10-CM | POA: Diagnosis not present

## 2019-09-07 DIAGNOSIS — K5792 Diverticulitis of intestine, part unspecified, without perforation or abscess without bleeding: Secondary | ICD-10-CM | POA: Diagnosis not present

## 2019-09-07 DIAGNOSIS — E7800 Pure hypercholesterolemia, unspecified: Secondary | ICD-10-CM | POA: Diagnosis not present

## 2019-09-07 DIAGNOSIS — R000 Tachycardia, unspecified: Secondary | ICD-10-CM

## 2019-09-07 DIAGNOSIS — E049 Nontoxic goiter, unspecified: Secondary | ICD-10-CM | POA: Diagnosis not present

## 2019-09-07 DIAGNOSIS — J3089 Other allergic rhinitis: Secondary | ICD-10-CM | POA: Diagnosis not present

## 2019-09-07 DIAGNOSIS — K219 Gastro-esophageal reflux disease without esophagitis: Secondary | ICD-10-CM | POA: Diagnosis not present

## 2019-09-07 DIAGNOSIS — M810 Age-related osteoporosis without current pathological fracture: Secondary | ICD-10-CM | POA: Diagnosis not present

## 2019-09-07 DIAGNOSIS — J441 Chronic obstructive pulmonary disease with (acute) exacerbation: Secondary | ICD-10-CM | POA: Diagnosis not present

## 2019-09-07 NOTE — Telephone Encounter (Signed)
I spoke to the patient who has been experiencing tachycardia (HR between 100-140) and SOB over the past week.  She saw Ermalinda Barrios on 10/27 with a Virtual Visit, but this was not occurring at that time.  She denies CP or any other symptoms.  I also informed the Home Health Nurse Alma Downs) from [facility].

## 2019-09-07 NOTE — Telephone Encounter (Signed)
New Message   Agapito Games from [facility] health is calling to report that the pt has had a high resting HR in the 100's. She says when the pt is walking it will go as high as 120's and will go back down with Rest.  Agapito Games is requesting a call to the pt and she would also like a call back as well  Please call

## 2019-09-08 ENCOUNTER — Telehealth: Payer: Self-pay | Admitting: Radiology

## 2019-09-08 NOTE — Telephone Encounter (Signed)
Called and spoke to patient. She states that she has not really been using her inhaler and that she has cut back on her caffeine. Patient states that her BP runs between 92/56 and 120/60. She states that she has been a little more SOB this past week. She states that she is on 2L of O2. Denies cough, weight gain, or swelling. Discussed limiting salt. Made patient aware that we will have her wear a 30 day monitor. Patient aware that it will be mailed to her.

## 2019-09-08 NOTE — Telephone Encounter (Signed)
Enrolled patient for a 30 day [employer] event monitor to be mailed. Brief instructions were gone over with the patients and she knows to expect the monitor to arrive in 3-4 days.

## 2019-09-08 NOTE — Telephone Encounter (Signed)
Please ask patient if she's using her inhalers more frequently or if she is drinking extra caffeine? Can't use beta blocker with COPD. Please send 30 day monitor to patient. If BP can tolerate could try cardizem 120 mg daily. If I have a spot on my schedule tomorrow could add her. Thanks.

## 2019-09-08 NOTE — Telephone Encounter (Signed)
Agree. thanks

## 2019-09-09 ENCOUNTER — Telehealth: Payer: Self-pay | Admitting: Pulmonary Disease

## 2019-09-09 MED ORDER — BREO ELLIPTA 200-25 MCG/INH IN AEPB: 1.0000 | INHALATION_SPRAY | Freq: Every day | RESPIRATORY_TRACT | 1 refills | Status: DC

## 2019-09-09 MED FILL — BREO ELLIPTA 200-25 MCG INH: 200-25 | 30 days supply | Qty: 60 | Fill #0

## 2019-09-09 NOTE — Telephone Encounter (Signed)
Called and spoke with Patient.  Patient requested a refill for Breo to be sent to [facility].  Requested refill sent to requested pharmacy.  Nothing further at this time.

## 2019-09-16 ENCOUNTER — Encounter (INDEPENDENT_AMBULATORY_CARE_PROVIDER_SITE_OTHER): Payer: Medicare HMO

## 2019-09-16 DIAGNOSIS — R000 Tachycardia, unspecified: Secondary | ICD-10-CM

## 2019-09-25 DIAGNOSIS — J449 Chronic obstructive pulmonary disease, unspecified: Secondary | ICD-10-CM | POA: Diagnosis not present

## 2019-09-25 DIAGNOSIS — J439 Emphysema, unspecified: Secondary | ICD-10-CM | POA: Diagnosis not present

## 2019-09-25 DIAGNOSIS — J42 Unspecified chronic bronchitis: Secondary | ICD-10-CM | POA: Diagnosis not present

## 2019-09-25 DIAGNOSIS — J9611 Chronic respiratory failure with hypoxia: Secondary | ICD-10-CM | POA: Diagnosis not present

## 2019-09-26 DIAGNOSIS — J449 Chronic obstructive pulmonary disease, unspecified: Secondary | ICD-10-CM | POA: Diagnosis not present

## 2019-09-26 DIAGNOSIS — J42 Unspecified chronic bronchitis: Secondary | ICD-10-CM | POA: Diagnosis not present

## 2019-09-26 DIAGNOSIS — J9611 Chronic respiratory failure with hypoxia: Secondary | ICD-10-CM | POA: Diagnosis not present

## 2019-09-26 DIAGNOSIS — J439 Emphysema, unspecified: Secondary | ICD-10-CM | POA: Diagnosis not present

## 2019-10-12 MED FILL — BREO ELLIPTA 200-25 MCG INH: 200-25 | 30 days supply | Qty: 60 | Fill #1

## 2019-10-12 MED FILL — ALBUTEROL SULFATE HFA 108 (: 108 (90 BAS | 25 days supply | Qty: 18 | Fill #2

## 2019-10-12 MED FILL — SPIRIVA RESPIMAT 2.5 MCG IN: 2.5 | 90 days supply | Qty: 12 | Fill #1

## 2019-10-20 ENCOUNTER — Other Ambulatory Visit: Payer: Self-pay

## 2019-10-20 ENCOUNTER — Ambulatory Visit (HOSPITAL_BASED_OUTPATIENT_CLINIC_OR_DEPARTMENT_OTHER)
Admission: RE | Admit: 2019-10-20 | Discharge: 2019-10-20 | Disposition: A | Discharge status: Home / Self Care | Payer: Medicare HMO | Source: Ambulatory Visit | Attending: Acute Care | Admitting: Acute Care

## 2019-10-20 DIAGNOSIS — R911 Solitary pulmonary nodule: Secondary | ICD-10-CM | POA: Diagnosis not present

## 2019-10-26 DIAGNOSIS — J42 Unspecified chronic bronchitis: Secondary | ICD-10-CM | POA: Diagnosis not present

## 2019-10-26 DIAGNOSIS — J9611 Chronic respiratory failure with hypoxia: Secondary | ICD-10-CM | POA: Diagnosis not present

## 2019-10-26 DIAGNOSIS — J449 Chronic obstructive pulmonary disease, unspecified: Secondary | ICD-10-CM | POA: Diagnosis not present

## 2019-10-26 DIAGNOSIS — J439 Emphysema, unspecified: Secondary | ICD-10-CM | POA: Diagnosis not present

## 2019-10-27 DIAGNOSIS — J449 Chronic obstructive pulmonary disease, unspecified: Secondary | ICD-10-CM | POA: Diagnosis not present

## 2019-10-27 DIAGNOSIS — J9611 Chronic respiratory failure with hypoxia: Secondary | ICD-10-CM | POA: Diagnosis not present

## 2019-10-27 DIAGNOSIS — J42 Unspecified chronic bronchitis: Secondary | ICD-10-CM | POA: Diagnosis not present

## 2019-10-27 DIAGNOSIS — J439 Emphysema, unspecified: Secondary | ICD-10-CM | POA: Diagnosis not present

## 2019-10-28 NOTE — Progress Notes (Signed)
Please call patient and let her know her CT shows resolution of the pulmonary nodule we were watching. This is good news. We will continue with her screening scans starting 10/2020. Please fax results to PCP and order 12 month follow up screening scan. Thanks so much

## 2019-10-29 ENCOUNTER — Other Ambulatory Visit: Payer: Self-pay | Admitting: *Deleted

## 2019-10-29 DIAGNOSIS — Z87891 Personal history of nicotine dependence: Secondary | ICD-10-CM

## 2019-11-05 ENCOUNTER — Other Ambulatory Visit: Payer: Self-pay | Admitting: Family Medicine

## 2019-11-05 DIAGNOSIS — J42 Unspecified chronic bronchitis: Secondary | ICD-10-CM

## 2019-11-05 DIAGNOSIS — J3089 Other allergic rhinitis: Secondary | ICD-10-CM

## 2019-11-05 MED FILL — LORATADINE 10 MG TABS: 10 | 100 days supply | Qty: 100 | Fill #2

## 2019-11-05 MED FILL — ATORVASTATIN 40 MG TABLET: 40 | 30 days supply | Qty: 30 | Fill #2

## 2019-11-05 MED FILL — ALBUTEROL SUL 2.5 MG/3 ML S: (2.5 MG/3ML | 7 days supply | Qty: 75 | Fill #2

## 2019-11-05 NOTE — Telephone Encounter (Signed)
Please fill if patient should use with BREO

## 2019-11-06 MED FILL — ALBUTEROL SULFATE HFA 108 (: 108 (90 BAS | 25 days supply | Qty: 18 | Fill #0

## 2019-11-06 MED FILL — FLUTICASONE PROP 50 MCG SPR: 50 | 30 days supply | Qty: 16 | Fill #0

## 2019-11-09 MED FILL — BREO ELLIPTA 200-25 MCG INH: 200-25 | 60 days supply | Qty: 60 | Fill #0

## 2019-11-10 ENCOUNTER — Other Ambulatory Visit: Payer: Self-pay

## 2019-11-10 MED ORDER — DILTIAZEM HCL ER COATED BEADS 120 MG PO CP24: 120.0000 mg | ORAL_CAPSULE | Freq: Every day | ORAL | 11 refills | Status: DC

## 2019-11-10 MED FILL — CARTIA XT 120 MG CAPSULE: 120 | 30 days supply | Qty: 30 | Fill #0

## 2019-11-26 DIAGNOSIS — J9611 Chronic respiratory failure with hypoxia: Secondary | ICD-10-CM | POA: Diagnosis not present

## 2019-11-26 DIAGNOSIS — J42 Unspecified chronic bronchitis: Secondary | ICD-10-CM | POA: Diagnosis not present

## 2019-11-26 DIAGNOSIS — J439 Emphysema, unspecified: Secondary | ICD-10-CM | POA: Diagnosis not present

## 2019-11-26 DIAGNOSIS — J449 Chronic obstructive pulmonary disease, unspecified: Secondary | ICD-10-CM | POA: Diagnosis not present

## 2019-11-27 DIAGNOSIS — J449 Chronic obstructive pulmonary disease, unspecified: Secondary | ICD-10-CM | POA: Diagnosis not present

## 2019-11-27 DIAGNOSIS — J439 Emphysema, unspecified: Secondary | ICD-10-CM | POA: Diagnosis not present

## 2019-11-27 DIAGNOSIS — J42 Unspecified chronic bronchitis: Secondary | ICD-10-CM | POA: Diagnosis not present

## 2019-11-27 DIAGNOSIS — J9611 Chronic respiratory failure with hypoxia: Secondary | ICD-10-CM | POA: Diagnosis not present

## 2019-12-07 ENCOUNTER — Other Ambulatory Visit: Payer: Self-pay | Admitting: Pulmonary Disease

## 2019-12-07 MED FILL — BREO ELLIPTA 200-25 MCG INH: 200-25 | 30 days supply | Qty: 60 | Fill #0

## 2019-12-17 ENCOUNTER — Ambulatory Visit (INDEPENDENT_AMBULATORY_CARE_PROVIDER_SITE_OTHER): Payer: Medicare HMO | Admitting: Pulmonary Disease

## 2019-12-17 ENCOUNTER — Encounter: Payer: Self-pay | Admitting: Pulmonary Disease

## 2019-12-17 ENCOUNTER — Other Ambulatory Visit: Payer: Self-pay

## 2019-12-17 DIAGNOSIS — J449 Chronic obstructive pulmonary disease, unspecified: Secondary | ICD-10-CM | POA: Diagnosis not present

## 2019-12-17 DIAGNOSIS — Z7951 Long term (current) use of inhaled steroids: Secondary | ICD-10-CM | POA: Diagnosis not present

## 2019-12-17 NOTE — Patient Instructions (Signed)
Continue inhalers Continue annual screening CT of the chest Follow-up in 6 months.

## 2019-12-17 NOTE — Progress Notes (Signed)
Virtual Visit via Telephone Note  I connected with Margaret Henry on 12/17/19 at  2:45 PM EST by telephone and verified that I am speaking with the correct person using two identifiers.  Location: Patient: Home Provider: [facility], [place], [place]   I discussed the limitations, risks, security and privacy concerns of performing an evaluation and management service by telephone and the availability of in person appointments. I also discussed with the patient that there may be a patient responsible charge related to this service. The patient expressed understanding and agreed to proceed.   History of Present Illness: Follow-up for COPD She is currently maintained on Spiriva and breo.  Tried on Trelegy inhaler in 2019 but had to go back to [place] and Spiriva since it made her breathing worse.  Also tried on Daliresp in 2019 but had to stop due to side effects of nausea  Observations/Objective: Doing well with regard to breathing On Spirvia, Breo  CT 10/20/2019-resolution of left lower lobe pulmonary nodule, no other lung abnormality I have reviewed the images personally.  Assessment and Plan: COPD Continue inhalers Continue annual screening CT of the chest  Follow Up Instructions: Follow-up in 6 months.   I discussed the assessment and treatment plan with the patient. The patient was provided an opportunity to ask questions and all were answered. The patient agreed with the plan and demonstrated an understanding of the instructions.   The patient was advised to call back or seek an in-person evaluation if the symptoms worsen or if the condition fails to improve as anticipated.  I provided 25 minutes of non-face-to-face time during this encounter.   Marshell Garfinkel MD [facility] 12/17/2019, 3:45 PM

## 2020-01-04 ENCOUNTER — Other Ambulatory Visit: Payer: Self-pay | Admitting: Pharmacist

## 2020-01-04 DIAGNOSIS — J3089 Other allergic rhinitis: Secondary | ICD-10-CM

## 2020-01-04 MED ORDER — FLUTICASONE PROPIONATE 50 MCG/ACT NA SUSP: 2.0000 | Freq: Every day | NASAL | 0 refills | Status: DC

## 2020-01-07 DIAGNOSIS — J9611 Chronic respiratory failure with hypoxia: Secondary | ICD-10-CM | POA: Diagnosis not present

## 2020-01-07 DIAGNOSIS — J439 Emphysema, unspecified: Secondary | ICD-10-CM | POA: Diagnosis not present

## 2020-01-07 DIAGNOSIS — J42 Unspecified chronic bronchitis: Secondary | ICD-10-CM | POA: Diagnosis not present

## 2020-01-07 DIAGNOSIS — J449 Chronic obstructive pulmonary disease, unspecified: Secondary | ICD-10-CM | POA: Diagnosis not present

## 2020-01-28 ENCOUNTER — Other Ambulatory Visit: Payer: Self-pay | Admitting: Family Medicine

## 2020-01-28 DIAGNOSIS — J3089 Other allergic rhinitis: Secondary | ICD-10-CM

## 2020-02-04 ENCOUNTER — Other Ambulatory Visit: Payer: Self-pay

## 2020-02-11 ENCOUNTER — Encounter: Payer: Self-pay | Admitting: Family Medicine

## 2020-02-11 ENCOUNTER — Other Ambulatory Visit: Payer: Self-pay | Admitting: Family Medicine

## 2020-02-11 DIAGNOSIS — J3089 Other allergic rhinitis: Secondary | ICD-10-CM

## 2020-02-11 DIAGNOSIS — I2510 Atherosclerotic heart disease of native coronary artery without angina pectoris: Secondary | ICD-10-CM

## 2020-02-11 DIAGNOSIS — J42 Unspecified chronic bronchitis: Secondary | ICD-10-CM

## 2020-02-11 DIAGNOSIS — E782 Mixed hyperlipidemia: Secondary | ICD-10-CM

## 2020-02-11 MED ORDER — ASPIRIN 81 MG PO TBEC: DELAYED_RELEASE_TABLET | ORAL | 1 refills | Status: DC

## 2020-02-11 MED ORDER — FLUTICASONE PROPIONATE 50 MCG/ACT NA SUSP: NASAL | 6 refills | Status: DC

## 2020-02-11 MED ORDER — ATORVASTATIN CALCIUM 40 MG PO TABS: 40.0000 mg | ORAL_TABLET | Freq: Every day | ORAL | 1 refills | Status: DC

## 2020-02-11 MED ORDER — ALBUTEROL SULFATE (2.5 MG/3ML) 0.083% IN NEBU: INHALATION_SOLUTION | RESPIRATORY_TRACT | 2 refills | Status: DC

## 2020-02-11 MED ORDER — ALBUTEROL SULFATE HFA 108 (90 BASE) MCG/ACT IN AERS: 2.0000 | INHALATION_SPRAY | Freq: Four times a day (QID) | RESPIRATORY_TRACT | 2 refills | Status: DC | PRN

## 2020-02-23 DIAGNOSIS — J449 Chronic obstructive pulmonary disease, unspecified: Secondary | ICD-10-CM | POA: Diagnosis not present

## 2020-02-23 DIAGNOSIS — J42 Unspecified chronic bronchitis: Secondary | ICD-10-CM | POA: Diagnosis not present

## 2020-02-23 DIAGNOSIS — J9611 Chronic respiratory failure with hypoxia: Secondary | ICD-10-CM | POA: Diagnosis not present

## 2020-02-23 DIAGNOSIS — J439 Emphysema, unspecified: Secondary | ICD-10-CM | POA: Diagnosis not present

## 2020-02-25 ENCOUNTER — Ambulatory Visit: Payer: Medicare HMO | Attending: Family Medicine | Admitting: Family Medicine

## 2020-02-25 ENCOUNTER — Encounter: Payer: Self-pay | Admitting: Family Medicine

## 2020-02-25 ENCOUNTER — Other Ambulatory Visit: Payer: Self-pay

## 2020-02-25 DIAGNOSIS — J449 Chronic obstructive pulmonary disease, unspecified: Secondary | ICD-10-CM | POA: Diagnosis not present

## 2020-02-25 DIAGNOSIS — E782 Mixed hyperlipidemia: Secondary | ICD-10-CM

## 2020-02-25 MED ORDER — TRELEGY ELLIPTA 100-62.5-25 MCG/INH IN AEPB: 1.0000 | INHALATION_SPRAY | Freq: Every day | RESPIRATORY_TRACT | 3 refills | Status: DC

## 2020-02-25 NOTE — Progress Notes (Signed)
Still having trouble with her breathing.

## 2020-02-25 NOTE — Progress Notes (Signed)
Virtual Visit via Telephone Note  I connected with Laurence Compton, on 02/25/2020 at 8:42 AM by telephone due to the COVID-19 pandemic and verified that I am speaking with the correct person using two identifiers.   Consent: I discussed the limitations, risks, security and privacy concerns of performing an evaluation and management service by telephone and the availability of in person appointments. I also discussed with the patient that there may be a patient responsible charge related to this service. The patient expressed understanding and agreed to proceed.   Location of Patient: Home  Location of Provider: Clinic   Persons participating in Telemedicine visit: Nev[place] Pauls [place]-CMA Dr. Margarita Rana     History of Present Illness: Margaret MOLZAHN is a 69 year old woman with a history of COPD, previous tobacco abuse, GERD, hyperlipidemia, chronic respiratory failure (of 2 L of oxygen at rest) who presents today for a follow-up visit.  She gets out of breath when working. She tries to take off her oxygen during the day and just use it at night but even with 2L of oxygen she still feels dyspneic Compliant with Breo and Spiriva; tried Trelegy in the past but Pulmonary notes reveal he was ineffective.  On questioning the patient today she states she was smoking at the time she was on Trelegy and now has quit smoking; she is open to trying Trelegy again. Last visit with pulmonary was in 12/2019 Compliant with Lipitor and her reflux symptoms are controlled. She has no other acute concerns today.   Past Medical History:  Diagnosis Date  . Allergy    hayfever  . Bronchitis   . Cancer ([place])    skin cancer on chest  . Colon polyps   . Complication of anesthesia    pt states was given too much during nasal surgery 1989; difficulty getting awake  . COPD (chronic obstructive pulmonary disease) ([place])   . Diverticulitis   . GERD (gastroesophageal reflux disease)    occasional  .  Hemorrhoids   . High cholesterol   . Osteoporosis   . Pneumonia   . Shortness of breath dyspnea    with exertion   . Thyroid goiter    bx benign  . Vertigo    Allergies  Allergen Reactions  . Hydrocodone Nausea And Vomiting  . Propoxyphene N-Acetaminophen Nausea And Vomiting    Current Outpatient Medications on File Prior to Visit  Medication Sig Dispense Refill  . acetaminophen (TYLENOL) 500 MG tablet Take 1,500 mg by mouth every 4 (four) hours as needed for headache.     . albuterol (PROVENTIL) (2.5 MG/3ML) 0.083% nebulizer solution TAKE 3 MLS BY NEBULIZATION EVERY 6 HOURS AS NEEDED FOR WHEEZING OR SHORTNESS OF BREATH. 75 mL 2  . albuterol (VENTOLIN HFA) 108 (90 Base) MCG/ACT inhaler Inhale 2 puffs into the lungs every 6 (six) hours as needed for wheezing or shortness of breath. 18 g 2  . aspirin (ASPIRIN ADULT LOW STRENGTH) 81 MG EC tablet TAKE 1 TABLET (81 MG TOTAL) BY MOUTH DAILY. 90 tablet 1  . atorvastatin (LIPITOR) 40 MG tablet Take 1 tablet (40 mg total) by mouth daily at 6 PM. 90 tablet 1  . fluticasone furoate-vilanterol (BREO ELLIPTA) 200-25 MCG/INH AEPB Inhale 1 puff into the lungs daily. 60 each 1  . loratadine (CLARITIN) 10 MG tablet TAKE 1 TABLET (10 MG TOTAL) BY MOUTH DAILY. 100 tablet 2  . OXYGEN Inhale 2 L into the lungs.    Stann Ore  RESPIMAT 2.5 MCG/ACT AERS INHALE 2 PUFFS INTO THE LUNGS DAILY. 12 g 1  . cholecalciferol (VITAMIN D3) 25 MCG (1000 UNIT) tablet Take 1,000 Units by mouth daily.    Marland Kitchen diltiazem (CARDIZEM CD) 120 MG 24 hr capsule Take 1 capsule (120 mg total) by mouth daily. (Patient not taking: Reported on 02/25/2020) 30 capsule 11  . famotidine (PEPCID) 20 MG tablet Take 1 tablet by mouth daily as needed.    . ferrous sulfate 325 (65 FE) MG EC tablet Take 325 mg by mouth daily with breakfast.    . fluticasone (FLONASE) 50 MCG/ACT nasal spray USE 2 SPRAYS IN EACH NOSTRIL EVERY DAY (Patient not taking: Reported on 02/25/2020) 16 g 6   No current  facility-administered medications on file prior to visit.    Observations/Objective: Awake, alert, oriented x3 Not in acute distress  Assessment and Plan: 1. Mixed hyperlipidemia Controlled She is due for lipid panel but is unable to come into the office at this time as her major source of transportation is her son who is a Administrator. I have ordered fasting labs for her and she will have this done at med center which is close to her residence Continue Lipitor - CMP14+EGFR; Future - Lipid panel; Future  2. COPD, group D, by GOLD 2017 classification ([place]) Uncontrolled Switched from [place] and Spiriva to Trelegy Advised that if still symptomatic, notify the clinic and we will switch her back to [place]. - Fluticasone-Umeclidin-Vilant (TRELEGY ELLIPTA) 100-62.5-25 MCG/INH AEPB; Inhale 1 Inhaler into the lungs daily.  Dispense: 1 each; Refill: 3  3.  Chronic hypoxic respiratory failure Currently on 2 L of oxygen with persisting dyspnea Advised to increase to 3 L  Follow Up Instructions: 3 months for chronic disease management   I discussed the assessment and treatment plan with the patient. The patient was provided an opportunity to ask questions and all were answered. The patient agreed with the plan and demonstrated an understanding of the instructions.   The patient was advised to call back or seek an in-person evaluation if the symptoms worsen or if the condition fails to improve as anticipated.     I provided 14 minutes total of non-face-to-face time during this encounter including median intraservice time, reviewing previous notes, investigations, ordering medications, medical decision making, coordinating care and patient verbalized understanding at the end of the visit.     [name], MD, FAAFP. [facility] and [place], [place]   02/25/2020, 8:42 AM

## 2020-03-10 ENCOUNTER — Ambulatory Visit: Payer: Medicare HMO | Admitting: Family Medicine

## 2020-03-18 ENCOUNTER — Encounter: Payer: Self-pay | Admitting: Family Medicine

## 2020-03-18 ENCOUNTER — Other Ambulatory Visit: Payer: Self-pay | Admitting: Family Medicine

## 2020-03-18 MED ORDER — SPIRIVA HANDIHALER 18 MCG IN CAPS: 18.0000 ug | ORAL_CAPSULE | Freq: Every day | RESPIRATORY_TRACT | 6 refills | Status: DC

## 2020-03-18 MED ORDER — BREO ELLIPTA 200-25 MCG/INH IN AEPB: 1.0000 | INHALATION_SPRAY | Freq: Every day | RESPIRATORY_TRACT | 6 refills | Status: DC

## 2020-03-25 DIAGNOSIS — J439 Emphysema, unspecified: Secondary | ICD-10-CM | POA: Diagnosis not present

## 2020-03-25 DIAGNOSIS — J9611 Chronic respiratory failure with hypoxia: Secondary | ICD-10-CM | POA: Diagnosis not present

## 2020-03-25 DIAGNOSIS — J449 Chronic obstructive pulmonary disease, unspecified: Secondary | ICD-10-CM | POA: Diagnosis not present

## 2020-03-25 DIAGNOSIS — J42 Unspecified chronic bronchitis: Secondary | ICD-10-CM | POA: Diagnosis not present

## 2020-03-31 ENCOUNTER — Encounter: Payer: Self-pay | Admitting: Family Medicine

## 2020-04-04 ENCOUNTER — Ambulatory Visit: Payer: Medicare HMO | Attending: Family Medicine

## 2020-04-04 ENCOUNTER — Other Ambulatory Visit: Payer: Self-pay

## 2020-04-04 DIAGNOSIS — E782 Mixed hyperlipidemia: Secondary | ICD-10-CM

## 2020-04-05 LAB — CMP14+EGFR
ALT: 11 IU/L (ref 0–32)
AST: 16 IU/L (ref 0–40)
Albumin/Globulin Ratio: 1.6 (ref 1.2–2.2)
Albumin: 4.6 g/dL (ref 3.8–4.8)
Alkaline Phosphatase: 116 IU/L (ref 48–121)
BUN/Creatinine Ratio: 11 — ABNORMAL LOW (ref 12–28)
BUN: 8 mg/dL (ref 8–27)
Bilirubin Total: 0.4 mg/dL (ref 0.0–1.2)
CO2: 22 mmol/L (ref 20–29)
Calcium: 9.5 mg/dL (ref 8.7–10.3)
Chloride: 107 mmol/L — ABNORMAL HIGH (ref 96–106)
Creatinine, Ser: 0.7 mg/dL (ref 0.57–1.00)
GFR calc Af Amer: 102 mL/min/{1.73_m2} (ref >59)
GFR calc non Af Amer: 89 mL/min/{1.73_m2} (ref >59)
Globulin, Total: 2.8 g/dL (ref 1.5–4.5)
Glucose: 104 mg/dL — ABNORMAL HIGH (ref 65–99)
Potassium: 4.2 mmol/L (ref 3.5–5.2)
Sodium: 142 mmol/L (ref 134–144)
Total Protein: 7.4 g/dL (ref 6.0–8.5)

## 2020-04-05 LAB — LIPID PANEL
Chol/HDL Ratio: 2.7 ratio (ref 0.0–4.4)
Cholesterol, Total: 181 mg/dL (ref 100–199)
HDL: 68 mg/dL (ref >39)
LDL Chol Calc (NIH): 90 mg/dL (ref 0–99)
Triglycerides: 131 mg/dL (ref 0–149)
VLDL Cholesterol Cal: 23 mg/dL (ref 5–40)

## 2020-04-19 ENCOUNTER — Other Ambulatory Visit: Payer: Self-pay | Admitting: Family Medicine

## 2020-04-19 ENCOUNTER — Encounter: Payer: Self-pay | Admitting: Family Medicine

## 2020-04-19 MED ORDER — SPIRIVA RESPIMAT 2.5 MCG/ACT IN AERS: 2.0000 | INHALATION_SPRAY | Freq: Every day | RESPIRATORY_TRACT | 6 refills | Status: DC

## 2020-04-20 ENCOUNTER — Telehealth: Payer: Self-pay

## 2020-04-20 NOTE — Telephone Encounter (Signed)
SPIRIVA RESPIMAT PRIOR AUTH HAS BEEN APPROVED THRU INS THRU 10/07/20

## 2020-04-24 DIAGNOSIS — J42 Unspecified chronic bronchitis: Secondary | ICD-10-CM | POA: Diagnosis not present

## 2020-04-24 DIAGNOSIS — J439 Emphysema, unspecified: Secondary | ICD-10-CM | POA: Diagnosis not present

## 2020-04-24 DIAGNOSIS — J449 Chronic obstructive pulmonary disease, unspecified: Secondary | ICD-10-CM | POA: Diagnosis not present

## 2020-04-24 DIAGNOSIS — J9611 Chronic respiratory failure with hypoxia: Secondary | ICD-10-CM | POA: Diagnosis not present

## 2020-05-25 DIAGNOSIS — J439 Emphysema, unspecified: Secondary | ICD-10-CM | POA: Diagnosis not present

## 2020-05-25 DIAGNOSIS — J42 Unspecified chronic bronchitis: Secondary | ICD-10-CM | POA: Diagnosis not present

## 2020-05-25 DIAGNOSIS — J449 Chronic obstructive pulmonary disease, unspecified: Secondary | ICD-10-CM | POA: Diagnosis not present

## 2020-05-25 DIAGNOSIS — J9611 Chronic respiratory failure with hypoxia: Secondary | ICD-10-CM | POA: Diagnosis not present

## 2020-06-10 ENCOUNTER — Encounter (HOSPITAL_BASED_OUTPATIENT_CLINIC_OR_DEPARTMENT_OTHER): Payer: Self-pay

## 2020-06-10 ENCOUNTER — Emergency Department (HOSPITAL_BASED_OUTPATIENT_CLINIC_OR_DEPARTMENT_OTHER)
Admission: EM | Admit: 2020-06-10 | Discharge: 2020-06-10 | Disposition: A | Discharge status: Home / Self Care | Payer: Medicare HMO | Attending: Emergency Medicine | Admitting: Emergency Medicine

## 2020-06-10 ENCOUNTER — Other Ambulatory Visit: Payer: Self-pay

## 2020-06-10 DIAGNOSIS — M545 Low back pain: Secondary | ICD-10-CM | POA: Insufficient documentation

## 2020-06-10 DIAGNOSIS — R350 Frequency of micturition: Secondary | ICD-10-CM | POA: Diagnosis not present

## 2020-06-10 DIAGNOSIS — Z5321 Procedure and treatment not carried out due to patient leaving prior to being seen by health care provider: Secondary | ICD-10-CM | POA: Insufficient documentation

## 2020-06-10 DIAGNOSIS — M549 Dorsalgia, unspecified: Secondary | ICD-10-CM | POA: Diagnosis present

## 2020-06-10 LAB — URINALYSIS, ROUTINE W REFLEX MICROSCOPIC
Bilirubin Urine: NEGATIVE
Glucose, UA: NEGATIVE mg/dL
Hgb urine dipstick: NEGATIVE
Ketones, ur: NEGATIVE mg/dL
Nitrite: NEGATIVE
Protein, ur: NEGATIVE mg/dL
Specific Gravity, Urine: 1.025 (ref 1.005–1.030)
pH: 5.5 (ref 5.0–8.0)

## 2020-06-10 LAB — URINALYSIS, MICROSCOPIC (REFLEX)

## 2020-06-10 NOTE — ED Triage Notes (Signed)
Pt c/o upper back pain and lower back pain for last 2 days. Pt also reporting urinary frequency. Pt normally on 2L O2 at home. Pt states for last 2 weeks she has had increased ShOB.

## 2020-06-25 DIAGNOSIS — J9611 Chronic respiratory failure with hypoxia: Secondary | ICD-10-CM | POA: Diagnosis not present

## 2020-06-25 DIAGNOSIS — J449 Chronic obstructive pulmonary disease, unspecified: Secondary | ICD-10-CM | POA: Diagnosis not present

## 2020-06-25 DIAGNOSIS — J439 Emphysema, unspecified: Secondary | ICD-10-CM | POA: Diagnosis not present

## 2020-06-25 DIAGNOSIS — J42 Unspecified chronic bronchitis: Secondary | ICD-10-CM | POA: Diagnosis not present

## 2020-06-28 ENCOUNTER — Other Ambulatory Visit (HOSPITAL_BASED_OUTPATIENT_CLINIC_OR_DEPARTMENT_OTHER): Payer: Self-pay | Admitting: Family Medicine

## 2020-06-28 DIAGNOSIS — Z1231 Encounter for screening mammogram for malignant neoplasm of breast: Secondary | ICD-10-CM

## 2020-07-04 ENCOUNTER — Encounter (HOSPITAL_BASED_OUTPATIENT_CLINIC_OR_DEPARTMENT_OTHER): Payer: Medicare HMO

## 2020-07-07 ENCOUNTER — Ambulatory Visit (HOSPITAL_BASED_OUTPATIENT_CLINIC_OR_DEPARTMENT_OTHER)
Admission: RE | Admit: 2020-07-07 | Discharge: 2020-07-07 | Disposition: A | Discharge status: Home / Self Care | Payer: Medicare HMO | Source: Ambulatory Visit | Attending: Family Medicine | Admitting: Family Medicine

## 2020-07-07 ENCOUNTER — Other Ambulatory Visit: Payer: Self-pay

## 2020-07-07 DIAGNOSIS — Z1231 Encounter for screening mammogram for malignant neoplasm of breast: Secondary | ICD-10-CM | POA: Insufficient documentation

## 2020-07-07 MED FILL — FLUAD QUADRIVALENT 0.5 ML P: 0.5 | 1 days supply | Qty: 1 | Fill #0

## 2020-07-25 DIAGNOSIS — J449 Chronic obstructive pulmonary disease, unspecified: Secondary | ICD-10-CM | POA: Diagnosis not present

## 2020-07-25 DIAGNOSIS — J9611 Chronic respiratory failure with hypoxia: Secondary | ICD-10-CM | POA: Diagnosis not present

## 2020-07-25 DIAGNOSIS — J439 Emphysema, unspecified: Secondary | ICD-10-CM | POA: Diagnosis not present

## 2020-07-25 DIAGNOSIS — J42 Unspecified chronic bronchitis: Secondary | ICD-10-CM | POA: Diagnosis not present

## 2020-08-19 ENCOUNTER — Other Ambulatory Visit: Payer: Self-pay | Admitting: Family Medicine

## 2020-08-19 ENCOUNTER — Encounter: Payer: Self-pay | Admitting: Family Medicine

## 2020-08-25 DIAGNOSIS — J42 Unspecified chronic bronchitis: Secondary | ICD-10-CM | POA: Diagnosis not present

## 2020-08-25 DIAGNOSIS — J449 Chronic obstructive pulmonary disease, unspecified: Secondary | ICD-10-CM | POA: Diagnosis not present

## 2020-08-25 DIAGNOSIS — J9611 Chronic respiratory failure with hypoxia: Secondary | ICD-10-CM | POA: Diagnosis not present

## 2020-08-25 DIAGNOSIS — J439 Emphysema, unspecified: Secondary | ICD-10-CM | POA: Diagnosis not present

## 2020-09-04 ENCOUNTER — Other Ambulatory Visit: Payer: Self-pay | Admitting: Family Medicine

## 2020-09-04 NOTE — Telephone Encounter (Signed)
.   Requested Prescriptions  Pending Prescriptions Disp Refills  . BREO ELLIPTA 200-25 MCG/INH AEPB [Pharmacy Med Name: BREO ELLIPTA 200-25 MCG/INH Aerosol Powder Breath Activated] 1 each 5    Sig: INHALE 1 PUFF INTO THE LUNGS DAILY. DISCONTINUE TRELEGY     Pulmonology:  [facility] [employer] Passed - 09/04/2020  4:14 PM      Passed - Valid encounter within last 12 months    Recent Outpatient Visits          6 months ago Mixed hyperlipidemia   [place], Enobong, MD   1 year ago Chronic respiratory failure with hypoxia [facility])   [place] [facility] [name], MD   1 year ago COPD, group D, by GOLD 2017 classification [facility])   [place], Enobong, MD   1 year ago COPD, group D, by GOLD 2017 classification [facility])   [place] [name], MD   2 years ago Screening for diabetes mellitus   [place] [facility] [name], MD

## 2020-09-24 DIAGNOSIS — J439 Emphysema, unspecified: Secondary | ICD-10-CM | POA: Diagnosis not present

## 2020-09-24 DIAGNOSIS — J42 Unspecified chronic bronchitis: Secondary | ICD-10-CM | POA: Diagnosis not present

## 2020-09-24 DIAGNOSIS — J449 Chronic obstructive pulmonary disease, unspecified: Secondary | ICD-10-CM | POA: Diagnosis not present

## 2020-09-24 DIAGNOSIS — J9611 Chronic respiratory failure with hypoxia: Secondary | ICD-10-CM | POA: Diagnosis not present

## 2020-09-29 ENCOUNTER — Telehealth: Payer: Self-pay

## 2020-09-29 ENCOUNTER — Ambulatory Visit: Payer: Medicare HMO | Attending: Physician Assistant | Admitting: Physician Assistant

## 2020-09-29 ENCOUNTER — Other Ambulatory Visit: Payer: Self-pay

## 2020-09-29 ENCOUNTER — Encounter: Payer: Self-pay | Admitting: Physician Assistant

## 2020-09-29 DIAGNOSIS — J9611 Chronic respiratory failure with hypoxia: Secondary | ICD-10-CM | POA: Diagnosis not present

## 2020-09-29 DIAGNOSIS — R5381 Other malaise: Secondary | ICD-10-CM | POA: Diagnosis not present

## 2020-09-29 NOTE — Progress Notes (Signed)
Patient ID: Margaret Henry, female   DOB: 1951-09-17, 69 y.o.   MRN: [id number] Virtual Visit via Telephone Note  I connected with Margaret Henry on 09/29/20 at 10:30 AM EST by telephone and verified that I am speaking with the correct person using two identifiers.  Location: Patient: Margaret Henry Provider: Freeman Caldron, PA-C   I discussed the limitations, risks, security and privacy concerns of performing an evaluation and management service by telephone and the availability of in person appointments. I also discussed with the patient that there may be a patient responsible charge related to this service. The patient expressed understanding and agreed to proceed.  PATIENT visit by telephone virtually in the context of Covid-19 pandemic. Patient location: My Location:  [place] office Persons on the call:  Screened by Francisco Capuchin, me and the patient  History of Present Illness:  Patient with chronic respiratory failure.  She last saw pulmonology in March.  Current inhaler regimen is Breo and Spiriva.  She quit smoking 2 years ago.  She felt as though trelegy made her worse.  Dr Margarita Rana had told her she could increase her O2 to 3L but she has been using 2L when she is active.  She does not sleep with it.  She is unable to walk more than 5-10 steps without having to rest; even with using O2.  She is unable to perform ADL adequately due to DOE.  She has benefitted for PT in the past with help in conditioning and also wants to see if she can have them come again    Observations/Objective:  NAD.  A&Ox3   Assessment and Plan: 1. Chronic respiratory failure with hypoxia (HCC) Continue inhalers and I have advised her to use the O2 all the time as this may improve her overall endurance and energy levels.  Also, increas to 3 L as advised by Dr Margarita Rana previously.  She will call for RF when she is on her last Rx - Ambulatory referral to Pulmonology - Ambulatory referral to [place]  2. Physical  deconditioning See #1 - Ambulatory referral to Home Health    Follow Up Instructions: See PCP in 3 months;  Sooner if needed  I discussed the assessment and treatment plan with the patient. The patient was provided an opportunity to ask questions and all were answered. The patient agreed with the plan and demonstrated an understanding of the instructions.   The patient was advised to call back or seek an in-person evaluation if the symptoms worsen or if the condition fails to improve as anticipated.  I provided 19 minutes of non-face-to-face time during this encounter.   Freeman Caldron, PA-C

## 2020-09-29 NOTE — Telephone Encounter (Signed)
Referral received for home health PT.  Call placed to patient and inquired if she has a preference for home health agencies.  She requested the agency she had in the past. Explained to her that if that agency is not able to accept the referral, this CM will check other companies.    Call placed to [facility] # [phone], spoke to [place] who stated that they should be able to staff it.  She requested referral be faxed to # [phone] for review.     Referral then faxed as requested.

## 2020-10-04 ENCOUNTER — Telehealth: Payer: Self-pay

## 2020-10-04 NOTE — Telephone Encounter (Signed)
Call placed to [facility] to confirm if they are able to accept the referral. Spoke to [facility] who said they are not able to accept it due to staffing.   Call placed to [facility] # [phone], spoke to [place] who said they do not cover [place]. She suggested calling their [facility] office # [phone].  Call placed to that office, spoke to [place] who declined the referral due to staffing.   Call placed to [facility] # [phone], spoke to [facility] who said they are not taking referrals now   Call placed to [facility] # [phone], spoke to [facility] who said that they would not be able to start services until after the weekend of 10/15/2020.  She requested that the referral be faxed for review  Fax # [phone].   Referral then faxed as requested.   Call placed to patient and informed her that [facility] and 2 other agencies were not able to accept the referral and explained that [facility] should be able to accept the referral but services would not start until after 10/15/2020.  A representative from [facility] would contact her with a start of care date and she said she understood.

## 2020-10-10 DIAGNOSIS — Z7982 Long term (current) use of aspirin: Secondary | ICD-10-CM | POA: Diagnosis not present

## 2020-10-10 DIAGNOSIS — J9611 Chronic respiratory failure with hypoxia: Secondary | ICD-10-CM | POA: Diagnosis not present

## 2020-10-10 DIAGNOSIS — Z9981 Dependence on supplemental oxygen: Secondary | ICD-10-CM | POA: Diagnosis not present

## 2020-10-10 DIAGNOSIS — Z7951 Long term (current) use of inhaled steroids: Secondary | ICD-10-CM | POA: Diagnosis not present

## 2020-10-13 ENCOUNTER — Telehealth: Payer: Self-pay | Admitting: Family Medicine

## 2020-10-13 NOTE — Telephone Encounter (Signed)
Home Health Verbal Orders - Caller/Agency: [place] / [facility] Number: [phone] secure VM can be left  Requesting home health PT Frequency: 2x's a week for 4 weeks 1x a week for 5 weeks  Also requesting Home Health OT Eval

## 2020-10-13 NOTE — Telephone Encounter (Signed)
Call placed to [facility], spoke to [place] who confirmed that an assessment was done 10/10/2020.

## 2020-10-13 NOTE — Telephone Encounter (Signed)
Verbal orders were given to patient.

## 2020-10-20 ENCOUNTER — Other Ambulatory Visit: Payer: Self-pay

## 2020-10-20 ENCOUNTER — Ambulatory Visit (HOSPITAL_BASED_OUTPATIENT_CLINIC_OR_DEPARTMENT_OTHER)
Admission: RE | Admit: 2020-10-20 | Discharge: 2020-10-20 | Disposition: A | Discharge status: Home / Self Care | Payer: Medicare HMO | Source: Ambulatory Visit | Attending: Acute Care | Admitting: Acute Care

## 2020-10-20 DIAGNOSIS — Z87891 Personal history of nicotine dependence: Secondary | ICD-10-CM | POA: Diagnosis not present

## 2020-10-25 DIAGNOSIS — J439 Emphysema, unspecified: Secondary | ICD-10-CM | POA: Diagnosis not present

## 2020-10-25 DIAGNOSIS — J42 Unspecified chronic bronchitis: Secondary | ICD-10-CM | POA: Diagnosis not present

## 2020-10-25 DIAGNOSIS — J449 Chronic obstructive pulmonary disease, unspecified: Secondary | ICD-10-CM | POA: Diagnosis not present

## 2020-10-25 DIAGNOSIS — J9611 Chronic respiratory failure with hypoxia: Secondary | ICD-10-CM | POA: Diagnosis not present

## 2020-10-28 DIAGNOSIS — Z7982 Long term (current) use of aspirin: Secondary | ICD-10-CM | POA: Diagnosis not present

## 2020-10-28 DIAGNOSIS — Z9981 Dependence on supplemental oxygen: Secondary | ICD-10-CM | POA: Diagnosis not present

## 2020-10-28 DIAGNOSIS — Z7951 Long term (current) use of inhaled steroids: Secondary | ICD-10-CM | POA: Diagnosis not present

## 2020-10-28 DIAGNOSIS — J9611 Chronic respiratory failure with hypoxia: Secondary | ICD-10-CM | POA: Diagnosis not present

## 2020-10-31 ENCOUNTER — Telehealth: Payer: Self-pay | Admitting: Pulmonary Disease

## 2020-10-31 DIAGNOSIS — J9611 Chronic respiratory failure with hypoxia: Secondary | ICD-10-CM | POA: Diagnosis not present

## 2020-10-31 DIAGNOSIS — Z7951 Long term (current) use of inhaled steroids: Secondary | ICD-10-CM | POA: Diagnosis not present

## 2020-10-31 DIAGNOSIS — Z7982 Long term (current) use of aspirin: Secondary | ICD-10-CM | POA: Diagnosis not present

## 2020-10-31 DIAGNOSIS — Z9981 Dependence on supplemental oxygen: Secondary | ICD-10-CM | POA: Diagnosis not present

## 2020-10-31 NOTE — Telephone Encounter (Signed)
Spoke with patient. She was currently scheduled for an OV with Dr. Vaughan Browner on Thursday to discuss her CT results. Her son normally brings her in for her appts but he will not be available on Thursday as he is a truck driver. She does not have anyone else who could bring her into the office. I advised her that I would go ahead and switch her appt to a televisit. She verbalized understanding.   Nothing further needed at time of call.

## 2020-11-03 ENCOUNTER — Other Ambulatory Visit: Payer: Self-pay

## 2020-11-03 ENCOUNTER — Ambulatory Visit (INDEPENDENT_AMBULATORY_CARE_PROVIDER_SITE_OTHER): Payer: Medicare HMO | Admitting: Pulmonary Disease

## 2020-11-03 ENCOUNTER — Other Ambulatory Visit: Payer: Self-pay | Admitting: *Deleted

## 2020-11-03 DIAGNOSIS — Z87891 Personal history of nicotine dependence: Secondary | ICD-10-CM

## 2020-11-03 DIAGNOSIS — R911 Solitary pulmonary nodule: Secondary | ICD-10-CM

## 2020-11-03 DIAGNOSIS — J438 Other emphysema: Secondary | ICD-10-CM

## 2020-11-03 MED ORDER — TRELEGY ELLIPTA 200-62.5-25 MCG/INH IN AEPB: 1.0000 | INHALATION_SPRAY | Freq: Every day | RESPIRATORY_TRACT | 5 refills | Status: DC

## 2020-11-03 NOTE — Patient Instructions (Signed)
Stop Margaret Henry Start Trelegy 200  Follow-up CT in 6 months Follow-up in 6 months.

## 2020-11-03 NOTE — Progress Notes (Signed)
Virtual Visit via Telephone Note  I connected with Margaret Henry on 11/03/20 at  2:45 PM EST by telephone and verified that I am speaking with the correct person using two identifiers.  Location: Patient: Home Provider: [facility], [place], [place]   I discussed the limitations, risks, security and privacy concerns of performing an evaluation and management service by telephone and the availability of in person appointments. I also discussed with the patient that there may be a patient responsible charge related to this service. The patient expressed understanding and agreed to proceed.   History of Present Illness: Follow-up for COPD She is currently maintained on Spiriva and breo.  Tried on Trelegy inhaler in 2019 but had to go back to [place] and Spiriva since it made her breathing worse.  Also tried on Daliresp in 2019 but had to stop due to side effects of nausea  Quit smoking 2019  Observations/Objective: Feels that her breathing is worse with chronic dyspnea on exertion.  No cough, sputum production, fevers, chills  CT 10/20/2019-resolution of left lower lobe pulmonary nodule, no other lung abnormality  Low-dose screening CT 10/20/2020-new 5.9 mm right lower lobe pulmonary nodule, emphysema, atherosclerosis, gallstones. I have reviewed the images personally.  Assessment and Plan: COPD Has mild worsening of dyspnea. She had tried Trelegy earlier but would like to try it again  Lung nodule CT reviewed with new right lower lobe nodule Follow-up CT ordered in 6 months  Follow Up Instructions: Stop Spiriva, Breo.  Order trelegy 200 Follow-up CT in 6 months.   I discussed the assessment and treatment plan with the patient. The patient was provided an opportunity to ask questions and all were answered. The patient agreed with the plan and demonstrated an understanding of the instructions.   The patient was advised to call back or seek an in-person  evaluation if the symptoms worsen or if the condition fails to improve as anticipated.  I provided 25 minutes of non-face-to-face time during this encounter.   Marshell Garfinkel MD [facility] 11/03/2020, 2:52 PM

## 2020-11-03 NOTE — Progress Notes (Signed)
I have called the patient with the results of her low dose CT. I explained that there was a small nodule that will need a 6 month follow up CT. She verbalized understanding. I explained that we will call closer to the time to get this scheduled. She verbalized understanding.   Denise. Please order 6 month follow up low dose, and fax PCP to let them know the plan. Thanks so much.

## 2020-11-04 DIAGNOSIS — Z7951 Long term (current) use of inhaled steroids: Secondary | ICD-10-CM | POA: Diagnosis not present

## 2020-11-04 DIAGNOSIS — Z9981 Dependence on supplemental oxygen: Secondary | ICD-10-CM | POA: Diagnosis not present

## 2020-11-04 DIAGNOSIS — Z7982 Long term (current) use of aspirin: Secondary | ICD-10-CM | POA: Diagnosis not present

## 2020-11-04 DIAGNOSIS — J9611 Chronic respiratory failure with hypoxia: Secondary | ICD-10-CM | POA: Diagnosis not present

## 2020-11-09 ENCOUNTER — Encounter: Payer: Self-pay | Admitting: Physician Assistant

## 2020-11-09 NOTE — Progress Notes (Signed)
Virtual Visit via Telephone Note   This visit type was conducted due to national recommendations for restrictions regarding the COVID-19 Pandemic (e.g. social distancing) in an effort to limit this patient's exposure and mitigate transmission in our community.  Due to her co-morbid illnesses, this patient is at least at moderate risk for complications without adequate follow up.  This format is felt to be most appropriate for this patient at this time.  The patient did not have access to video technology/had technical difficulties with video requiring transitioning to audio format only (telephone).  All issues noted in this document were discussed and addressed.  No physical exam could be performed with this format.  Please refer to the patient's chart for her  consent to telehealth for [facility].   The patient was identified using 2 identifiers.  Date:  11/11/2020   ID:  Margaret Henry, DOB June 16, 1951, MRN [id number]  Patient Location: Home Provider Location: Office/Clinic  PCP:  [name], MD  Cardiologist:  Ena Dawley, MD  Electrophysiologist:  None   Evaluation Performed:  Follow-Up Visit  Chief Complaint:  F/u coronary atherosclerosis, tachycardia  History of Present Illness:    Margaret Henry is a 70 y.o. female with CAD by CT scan, mild AI, tobacco abuse, HLD, COPD on home O2, GERD, hemorrhoids, osteoporosis, pulmonary nodule who presents for virtual follow-up. She previously established care for coronary calcification identified on CT for screening for lung CA as well as borderline size of ascending aorta and severe diffuse calcifications of the aortic root and descending thoracic aorta. CT was reviewed by Dr. Meda Coffee who felt her asending aorta was within normal limits. Nuclear stress test 06/2016 was negative for ischemia or prior infarction. 2D echo 2018 showed EF 65-70%, grade 1 DD, mild AI. She also has a strong family history of CAD with her mother dying at 30 of  an MI father an MI in his 35s and CABG in his 68s. She had an event monitor ordered in 09/2019 (resulting 10/2019) was relatively normal showing NSR with first degree AV block to sinus tachycardia with maximum HR 141bpm, average tachycardia 107bpm, average HR 83bpm. Her symptoms did not correlate with any arrhythmias. Per nurse note, patient was started on diltiazem at that time. She is followed by pulmonology and gets routine lung CA screening CTs.  The patient is seen back virtually at her request due to transportation issues due to her COPD. She generally requires her son assist her in getting to an appointment. In general she feels she is stable from a cardiac standpoint. She denies any new CP. She has chronic SOB felt due to her COPD which is followed by pulmonology without acute change. No bleeding. Palpitations have resolved. Her baseline HR tends to low 90s-low 100s.  Labs Independently Reviewed 03/2020 K 4.2, Cr 0.70, LFTs wnl, LDL 90 11/2018 Hgb 10.4  Past Medical History:  Diagnosis Date  . Allergy    hayfever  . Bronchitis   . Cancer ([place])    skin cancer on chest  . Colon polyps   . Complication of anesthesia    pt states was given too much during nasal surgery 1989; difficulty getting awake  . COPD (chronic obstructive pulmonary disease) ([place])   . Coronary artery calcification   . Diverticulitis   . GERD (gastroesophageal reflux disease)    occasional  . Hemorrhoids   . High cholesterol   . Hyperlipidemia LDL goal <70   . Mild aortic insufficiency   .  Osteoporosis   . Pneumonia   . Shortness of breath dyspnea    with exertion   . Thyroid goiter    bx benign  . Tobacco abuse   . Vertigo    Past Surgical History:  Procedure Laterality Date  . ABDOMINAL HYSTERECTOMY    . BILATERAL OOPHORECTOMY  2001   for benign ovarian mass   . BIOPSY THYROID    . DENTAL SURGERY     dentures  . NASAL SEPTUM SURGERY    . PARTIAL HYSTERECTOMY  1976   for heavy menses   . RECTAL  EXAM UNDER ANESTHESIA N/A 12/14/2015   Procedure: RECTAL EXAM UNDER ANESTHESIA REMOVAL OF ANAL CANAL MASS; INTERNAL HEMORRHOID LIGATION, EXTERNAL HEMORRHOID LIGATION;  Surgeon: Michael Boston, MD;  Location: [facility];  Service: General;  Laterality: N/A;  . TUBAL LIGATION    . WISDOM TOOTH EXTRACTION       Current Meds  Medication Sig  . acetaminophen (TYLENOL) 500 MG tablet Take 1,500 mg by mouth every 4 (four) hours as needed for headache.   . albuterol (PROVENTIL) (2.5 MG/3ML) 0.083% nebulizer solution TAKE 3 MLS BY NEBULIZATION EVERY 6 HOURS AS NEEDED FOR WHEEZING OR SHORTNESS OF BREATH.  Marland Kitchen albuterol (VENTOLIN HFA) 108 (90 Base) MCG/ACT inhaler Inhale 2 puffs into the lungs every 6 (six) hours as needed for wheezing or shortness of breath.  Marland Kitchen aspirin (ASPIRIN ADULT LOW STRENGTH) 81 MG EC tablet TAKE 1 TABLET (81 MG TOTAL) BY MOUTH DAILY.  Marland Kitchen atorvastatin (LIPITOR) 40 MG tablet Take 40 mg by mouth every other day.  . cholecalciferol (VITAMIN D3) 25 MCG (1000 UNIT) tablet Take 1,000 Units by mouth daily.  . famotidine (PEPCID) 20 MG tablet Take 1 tablet by mouth daily as needed.  . ferrous sulfate 325 (65 FE) MG EC tablet Take 325 mg by mouth every other day.  . fluticasone (FLONASE) 50 MCG/ACT nasal spray USE 2 SPRAYS IN EACH NOSTRIL EVERY DAY  . Fluticasone-Umeclidin-Vilant (TRELEGY ELLIPTA) 200-62.5-25 MCG/INH AEPB Inhale 1 puff into the lungs daily.  Marland Kitchen loratadine (CLARITIN) 10 MG tablet TAKE 1 TABLET (10 MG TOTAL) BY MOUTH DAILY.  Marland Kitchen OXYGEN Inhale 2 L into the lungs.     Allergies:   Hydrocodone and Propoxyphene n-acetaminophen   Social History   Tobacco Use  . Smoking status: Former Smoker    Packs/day: 1.50    Years: 49.00    Pack years: 73.50    Types: Cigarettes    Quit date: 11/03/2018    Years since quitting: 2.0  . Smokeless tobacco: Never Used  Vaping Use  . Vaping Use: Never used  Substance Use Topics  . Alcohol use: No    Alcohol/week: 0.0 standard drinks  . Drug use:  No     Family Hx: The patient's family history includes COPD in her father; Heart disease in her mother; Liver cancer in her paternal grandmother; Lung cancer in her maternal grandfather. There is no history of Colon cancer, Colon polyps, Esophageal cancer, Rectal cancer, or Stomach cancer.  ROS:   Please see the history of present illness.    All other systems reviewed and are negative.   Prior CV studies:   The following studies were reviewed today:  Event monitor 10/2019  Sinus rhythm with first-degree AV block to sinus tachycardia with maximum heart rate 141 bpm.  No arrhythmias.   Sinus rhythm to sinus tachycardia with maximum heart rate 141 bpm.  No arrhythmias.  Symptoms did not correlate with any  arrhythmias.  Normal 30 days event monitor.  2D echo 2018 - Left ventricle: The cavity size was normal. Wall thickness was  normal. Systolic function was vigorous. The estimated ejection  fraction was in the range of 65% to 70%. Wall motion was normal;  there were no regional wall motion abnormalities. Doppler  parameters are consistent with abnormal left ventricular  relaxation (grade 1 diastolic dysfunction).  - Aortic valve: There was mild regurgitation.   Impressions:   - Vigorous LV systolic function; mild diastolic dysfunction; aortic  valve not well visualized but appears to be mildly calcified;  mildly elevated velocity of 2.2 m/s likely related to vigorous LV  function; mild AI.   Stress test 2017  The left ventricular ejection fraction is hyperdynamic (>65%).  Nuclear stress EF: 74%.  There was no ST segment deviation noted during stress.  The study is normal.  This is a low risk study.   Normal resting and stress perfusion. No ischemia or infarction EF 74%      Labs/Other Tests and Data Reviewed:    EKG:  An ECG dated 06/10/20 was personally reviewed today and demonstrated:  NSR 90bpm, nonspecific STTW changes  Recent  Labs: 04/04/2020: ALT 11; BUN 8; Creatinine, Ser 0.70; Potassium 4.2; Sodium 142   Recent Lipid Panel Lab Results  Component Value Date/Time   CHOL 181 04/04/2020 11:15 AM   TRIG 131 04/04/2020 11:15 AM   HDL 68 04/04/2020 11:15 AM   CHOLHDL 2.7 04/04/2020 11:15 AM   CHOLHDL 3.1 09/05/2016 12:18 PM   LDLCALC 90 04/04/2020 11:15 AM   LDLDIRECT 166 (H) 09/26/2015 10:01 AM    Wt Readings from Last 3 Encounters:  11/11/20 125 lb (56.7 kg)  06/10/20 127 lb (57.6 kg)  08/04/19 120 lb (54.4 kg)     Objective:    Vital Signs:  BP (!) 104/58   Pulse (!) 114   Ht 5\' 4"  (1.626 m)   Wt 125 lb (56.7 kg)   BMI 21.46 kg/m    VS reviewed. General - calm F in no acute distress Pulm - No labored breathing, no coughing during visit, no audible wheezing, speaking in full sentences Neuro - A+Ox3, no slurred speech, answers questions appropriately Psych - Pleasant affect  ASSESSMENT & PLAN:    1. Coronary artery disease by CT scan - patient feels she is doing well without accelerating symptoms. Prior NST was reassuring. Continue risk factor modification with ASA and statin therapy. See below regarding statin.  2. HLD managed by PCP - Her last LDL was suboptimally controlled at 90. I have offered to repeat her labs to reassess and perhaps titrate statin but she prefers to touch base with her PCP instead. I have recommended she notify them that our recommended goal LDL is <70. Patient will notify for any escalating symptoms.  3. Sinus tachycardia - likely due to pulmonology issues. However, the patient was noted to be anemic in 2020 and I do not see this has been trended since that time. As above, offered to arrange recheck in our office but she politely declined and will seek out primary care to assist in obtaining. I had her write down the term CBC and will also pass this note onto PCP.  4. Mild aortic insufficiency - routine follow-up is usually due 3-5 years from initial study which was in  2018. As she is stable from a cardiac standpoint by symptoms, will plan to address at the 5 year mark (OV in 2023), but  she is aware to notify the office for any new symptoms.   Time:   Today, I have spent 12 minutes with the patient with telehealth technology discussing the above problems.     Medication Adjustments/Labs and Tests Ordered: Current medicines are reviewed at length with the patient today.  Testing and concerns regarding medicines are outlined above.    Follow Up:  1 year in person with new [facility] MD to establish care (Dr. Meda Coffee is relocating)  Signed, Charlie Pitter, PA-C  11/11/2020 11:56 AM    [place]

## 2020-11-11 ENCOUNTER — Telehealth (INDEPENDENT_AMBULATORY_CARE_PROVIDER_SITE_OTHER): Payer: Medicare HMO | Admitting: Physician Assistant

## 2020-11-11 ENCOUNTER — Encounter: Payer: Self-pay | Admitting: Physician Assistant

## 2020-11-11 ENCOUNTER — Other Ambulatory Visit: Payer: Self-pay

## 2020-11-11 ENCOUNTER — Telehealth: Payer: Self-pay | Admitting: *Deleted

## 2020-11-11 VITALS — BP 104/58 | HR 95 | Ht 64.0 in | Wt 125.0 lb

## 2020-11-11 DIAGNOSIS — I2510 Atherosclerotic heart disease of native coronary artery without angina pectoris: Secondary | ICD-10-CM | POA: Diagnosis not present

## 2020-11-11 DIAGNOSIS — R000 Tachycardia, unspecified: Secondary | ICD-10-CM

## 2020-11-11 DIAGNOSIS — E785 Hyperlipidemia, unspecified: Secondary | ICD-10-CM

## 2020-11-11 DIAGNOSIS — Z720 Tobacco use: Secondary | ICD-10-CM

## 2020-11-11 DIAGNOSIS — I351 Nonrheumatic aortic (valve) insufficiency: Secondary | ICD-10-CM

## 2020-11-11 NOTE — Telephone Encounter (Signed)
  Patient Consent for Virtual Visit         Margaret Henry has provided verbal consent on 11/11/2020 for a virtual visit (video or telephone).   CONSENT FOR VIRTUAL VISIT FOR:  Margaret Henry  By participating in this virtual visit I agree to the following:  I hereby voluntarily request, consent and authorize [facility] and its employed or contracted physicians, physician assistants, nurse practitioners or other licensed health care professionals (the Practitioner), to provide me with telemedicine health care services (the "Services") as deemed necessary by the treating Practitioner. I acknowledge and consent to receive the Services by the Practitioner via telemedicine. I understand that the telemedicine visit will involve communicating with the Practitioner through live audiovisual communication technology and the disclosure of certain medical information by electronic transmission. I acknowledge that I have been given the opportunity to request an in-person assessment or other available alternative prior to the telemedicine visit and am voluntarily participating in the telemedicine visit.  I understand that I have the right to withhold or withdraw my consent to the use of telemedicine in the course of my care at any time, without affecting my right to future care or treatment, and that the Practitioner or I may terminate the telemedicine visit at any time. I understand that I have the right to inspect all information obtained and/or recorded in the course of the telemedicine visit and may receive copies of available information for a reasonable fee.  I understand that some of the potential risks of receiving the Services via telemedicine include:  Marland Kitchen Delay or interruption in medical evaluation due to technological equipment failure or disruption; . Information transmitted may not be sufficient (e.g. poor resolution of images) to allow for appropriate medical decision making by the Practitioner;  and/or  . In rare instances, security protocols could fail, causing a breach of personal health information.  Furthermore, I acknowledge that it is my responsibility to provide information about my medical history, conditions and care that is complete and accurate to the best of my ability. I acknowledge that Practitioner's advice, recommendations, and/or decision may be based on factors not within their control, such as incomplete or inaccurate data provided by me or distortions of diagnostic images or specimens that may result from electronic transmissions. I understand that the practice of medicine is not an exact science and that Practitioner makes no warranties or guarantees regarding treatment outcomes. I acknowledge that a copy of this consent can be made available to me via my patient portal ([facility]), or I can request a printed copy by calling the office of [place].    I understand that my insurance will be billed for this visit.   I have read or had this consent read to me. . I understand the contents of this consent, which adequately explains the benefits and risks of the Services being provided via telemedicine.  . I have been provided ample opportunity to ask questions regarding this consent and the Services and have had my questions answered to my satisfaction. . I give my informed consent for the services to be provided through the use of telemedicine in my medical care

## 2020-11-11 NOTE — Addendum Note (Signed)
Addended by: Charlie Pitter on: 11/11/2020 12:39 PM   Modules accepted: Level of Service

## 2020-11-11 NOTE — Patient Instructions (Addendum)
Medication Instructions:  Your physician recommends that you continue on your current medications as directed. Please refer to the Current Medication list given to you today.  *If you need a refill on your cardiac medications before your next appointment, please call your pharmacy*   Lab Work: None ordered  If you have labs (blood work) drawn today and your tests are completely normal, you will receive your results only by: Marland Kitchen MyChart Message (if you have MyChart) OR . A paper copy in the mail If you have any lab test that is abnormal or we need to change your treatment, we will call you to review the results.   Testing/Procedures: None ordered    Follow-Up: At [facility], you and your health needs are our priority.  As part of our continuing mission to provide you with exceptional heart care, we have created designated Provider Care Teams.  These Care Teams include your primary Cardiologist (physician) and Advanced Practice Providers (APPs -  Physician Assistants and Nurse Practitioners) who all work together to provide you with the care you need, when you need it.  We recommend signing up for the patient portal called "MyChart".  Sign up information is provided on this After Visit Summary.  MyChart is used to connect with patients for Virtual Visits (Telemedicine).  Patients are able to view lab/test results, encounter notes, upcoming appointments, etc.  Non-urgent messages can be sent to your provider as well.   To learn more about what you can do with MyChart, go to [email or web address].    Your next appointment:   6 month(s)  The format for your next appointment:   In Person  Provider:    You may see Gwyndolyn Kaufman, MD    Other Instructions  As we discussed, please touch base with your primary care doctor about rechecking your blood count (for anemia) and cholesterol since your goal LDL should be 70. It was 90 the last time they checked it

## 2020-11-14 ENCOUNTER — Telehealth: Payer: Self-pay | Admitting: Family Medicine

## 2020-11-14 NOTE — Telephone Encounter (Signed)
Copied from [place] [id number]. Topic: General - Other >> Nov 11, 2020 12:21 PM Leward Quan A wrote: Reason for CRM: Patient called in to inform Dr Margarita Rana that per her Cardiologist she need to have some blood work, check blood count and re check cholesterol and have somethinhg below 70 was not sure what that is and blood sugar check. Please call Ph# [phone]

## 2020-11-14 NOTE — Telephone Encounter (Signed)
Called patient and advised her that Dr. Margarita Rana wanted her to have an appt to discuss concerns. Patient declined and stated she would only like to have lab appt and that she would call back at the end of the week to schedule.

## 2020-11-14 NOTE — Telephone Encounter (Signed)
Please schedule pt and appointment with [place].

## 2020-11-15 ENCOUNTER — Telehealth: Payer: Self-pay | Admitting: Physician Assistant

## 2020-11-15 DIAGNOSIS — I2510 Atherosclerotic heart disease of native coronary artery without angina pectoris: Secondary | ICD-10-CM

## 2020-11-15 DIAGNOSIS — Z79899 Other long term (current) drug therapy: Secondary | ICD-10-CM

## 2020-11-15 DIAGNOSIS — R000 Tachycardia, unspecified: Secondary | ICD-10-CM

## 2020-11-15 NOTE — Telephone Encounter (Addendum)
Pt would like [place] to perform blood work as she states that her PCP can't get her in until March and she doesn't want to wait that long. Pt will stop by the [facility] tomorrow for blood work, she is aware to be fasting. She appreciates our help with this. Orders placed in epic

## 2020-11-15 NOTE — Telephone Encounter (Signed)
At recent telemed visit under A/P I had offered to repeat her labs but she declined and said she planned to discuss with PCP instead given she relies on family for transportation. I am happy to order if she wants them coordinated through our office, please double check this is what she means. Would suggest fasting lipid profile + CMET + under dx of CAD and CBC + TSH under sinus tachycardia. Come fasting for the labs given the lipids. She does periodically go to one of the med centers for other care and likes the ease of use of their facility so if able to coordinate there and patient desires, OK to arrange. Thx!

## 2020-11-15 NOTE — Telephone Encounter (Signed)
Will route to Melina Copa, PA-C to advise on labs.  I do not see any mentioned in her note.

## 2020-11-15 NOTE — Telephone Encounter (Signed)
Patient called to schedule lab work as discussed during virtual visit 11/11/20. No orders were in the chart to schedule from. Please put in orders so patient can schedule lab work

## 2020-11-18 DIAGNOSIS — R000 Tachycardia, unspecified: Secondary | ICD-10-CM | POA: Diagnosis not present

## 2020-11-18 DIAGNOSIS — Z79899 Other long term (current) drug therapy: Secondary | ICD-10-CM | POA: Diagnosis not present

## 2020-11-18 DIAGNOSIS — I2510 Atherosclerotic heart disease of native coronary artery without angina pectoris: Secondary | ICD-10-CM | POA: Diagnosis not present

## 2020-11-19 LAB — CBC
Hematocrit: 32.4 % — ABNORMAL LOW (ref 34.0–46.6)
Hemoglobin: 10.1 g/dL — ABNORMAL LOW (ref 11.1–15.9)
MCH: 26 pg — ABNORMAL LOW (ref 26.6–33.0)
MCHC: 31.2 g/dL — ABNORMAL LOW (ref 31.5–35.7)
MCV: 83 fL (ref 79–97)
Platelets: 373 10*3/uL (ref 150–450)
RBC: 3.89 x10E6/uL (ref 3.77–5.28)
RDW: 15.4 % (ref 11.7–15.4)
WBC: 7.8 10*3/uL (ref 3.4–10.8)

## 2020-11-19 LAB — COMPREHENSIVE METABOLIC PANEL
ALT: 7 IU/L (ref 0–32)
AST: 17 IU/L (ref 0–40)
Albumin/Globulin Ratio: 1.7 (ref 1.2–2.2)
Albumin: 4.4 g/dL (ref 3.8–4.8)
Alkaline Phosphatase: 106 IU/L (ref 44–121)
BUN/Creatinine Ratio: 16 (ref 12–28)
BUN: 9 mg/dL (ref 8–27)
Bilirubin Total: 0.3 mg/dL (ref 0.0–1.2)
CO2: 19 mmol/L — ABNORMAL LOW (ref 20–29)
Calcium: 9.2 mg/dL (ref 8.7–10.3)
Chloride: 104 mmol/L (ref 96–106)
Creatinine, Ser: 0.57 mg/dL (ref 0.57–1.00)
GFR calc Af Amer: 109 mL/min/{1.73_m2} (ref >59)
GFR calc non Af Amer: 94 mL/min/{1.73_m2} (ref >59)
Globulin, Total: 2.6 g/dL (ref 1.5–4.5)
Glucose: 90 mg/dL (ref 65–99)
Potassium: 4.2 mmol/L (ref 3.5–5.2)
Sodium: 140 mmol/L (ref 134–144)
Total Protein: 7 g/dL (ref 6.0–8.5)

## 2020-11-19 LAB — LIPID PANEL
Chol/HDL Ratio: 3.2 ratio (ref 0.0–4.4)
Cholesterol, Total: 173 mg/dL (ref 100–199)
HDL: 54 mg/dL (ref >39)
LDL Chol Calc (NIH): 92 mg/dL (ref 0–99)
Triglycerides: 158 mg/dL — ABNORMAL HIGH (ref 0–149)
VLDL Cholesterol Cal: 27 mg/dL (ref 5–40)

## 2020-11-19 LAB — TSH: TSH: 0.663 u[IU]/mL (ref 0.450–4.500)

## 2020-11-22 ENCOUNTER — Other Ambulatory Visit: Payer: Self-pay | Admitting: *Deleted

## 2020-11-22 ENCOUNTER — Telehealth: Payer: Self-pay | Admitting: *Deleted

## 2020-11-22 DIAGNOSIS — Z79899 Other long term (current) drug therapy: Secondary | ICD-10-CM

## 2020-11-22 MED ORDER — ATORVASTATIN CALCIUM 40 MG PO TABS: 40.0000 mg | ORAL_TABLET | Freq: Every day | ORAL | 3 refills | Status: DC

## 2020-11-22 NOTE — Telephone Encounter (Signed)
-----   Message from Charlie Pitter, [place] sent at 11/21/2020  7:31 AM EST ----- Wasn't sure if Margaret Henry is going to be here today so also routing to triage. Please let pt know labs reviewed. All are normal except a few pertinent findings: - needs close f/u with primary care - she remains persistently anemic and looking over the last few labs this has become progressively worse since early 2020. If any dark/black stools or evidence of bleeding -> seek earlier care in ED. - LDL 92, above goal for where she should be with coronary artery calcium buildup. Please clarify what she is doing for statin. If taking every other day, would increase to 40mg  daily. If she is already taking it daily, increase to 80mg  daily. If she was previously unable to tolerate higher doses of atorvastatin would switch to Crestor 40mg . Regardless of the options listed would recheck ALT and lipid profile in 3 months. Thx.

## 2020-11-22 NOTE — Addendum Note (Signed)
Addended by: Gaetano Net on: 11/22/2020 11:10 AM   Modules accepted: Orders

## 2020-11-23 ENCOUNTER — Encounter: Payer: Self-pay | Admitting: Family Medicine

## 2020-12-12 ENCOUNTER — Other Ambulatory Visit: Payer: Self-pay | Admitting: Pulmonary Disease

## 2020-12-12 ENCOUNTER — Telehealth: Payer: Self-pay | Admitting: Pulmonary Disease

## 2020-12-12 MED ORDER — PREDNISONE 20 MG PO TABS: ORAL_TABLET | ORAL | 0 refills | Status: DC

## 2020-12-12 MED FILL — predniSONE 20 MG TABS: 20 | 5 days supply | Qty: 10 | Fill #0

## 2020-12-12 NOTE — Telephone Encounter (Signed)
Called and spoke with patient. She verbalized understanding. She will remain on Breo and Spiriva. Prednisone has been sent to [place].   I did attempt to get her scheduled for an appt on Thursday but she stated that she has to schedule her appts when her son is in town. He is a Administrator and is scheduled to leave tomorrow morning. She wishes to hold off on the appt for now but is aware she can call back if her breathing gets worse for an appt.   Nothing further needed at time of call.

## 2020-12-12 NOTE — Telephone Encounter (Signed)
Message noted.  This is a second time she she tried Trelegy and did worse Continue Breo and Spiriva for now Order prednisone 40 mg a day for 5 days  Make appointment at next available to see me.

## 2020-12-12 NOTE — Telephone Encounter (Signed)
Called and spoke with patient. She stated that at the last visit on 11/03/20, she was told to stop the Breo 222mcg and Spiriva 2.31mcg and start Trelegy 282mcg. She tried the Trelegy 245mcg for about 2 weeks but noticed that her SOB was getting worse. She also noticed that she was more hoarse especially in the mornings. She stopped the Trelegy about 2 weeks ago and went back to the [place]. Her breathing still has not gone back to her baseline. She notices the increased SOB with exertion. She is not able to sweep a room in her house without gasping for air.   She was told back in January that she needed to follow up in 6 months after her CT scan but she wonders if she needs to be seen sooner.  Dr. Vaughan Browner, can you please advise? Thanks!

## 2020-12-14 ENCOUNTER — Telehealth: Payer: Self-pay | Admitting: Pulmonary Disease

## 2020-12-14 NOTE — Telephone Encounter (Signed)
Called and spoke with patient, she verified that her issue was resolved yesterday, Prednisone was called in for her.  She will call us if she is not feeling better.

## 2020-12-15 NOTE — Telephone Encounter (Signed)
Called and spoke with patient to get her scheduled with Dr. Vaughan Browner. She is now scheduled for 01/24/21 at 11:45. Nothing further needed at this time.

## 2020-12-19 ENCOUNTER — Encounter: Payer: Self-pay | Admitting: Family Medicine

## 2020-12-19 ENCOUNTER — Ambulatory Visit: Payer: Medicare HMO | Attending: Family Medicine | Admitting: Family Medicine

## 2020-12-19 ENCOUNTER — Other Ambulatory Visit: Payer: Self-pay

## 2020-12-19 DIAGNOSIS — D649 Anemia, unspecified: Secondary | ICD-10-CM | POA: Diagnosis not present

## 2020-12-19 DIAGNOSIS — R8290 Unspecified abnormal findings in urine: Secondary | ICD-10-CM | POA: Diagnosis not present

## 2020-12-19 NOTE — Progress Notes (Signed)
Virtual Visit via Telephone Note  I connected with Margaret Henry, on 12/19/2020 at 2:42 PM by telephone due to the COVID-19 pandemic and verified that I am speaking with the correct person using two identifiers.   Consent: I discussed the limitations, risks, security and privacy concerns of performing an evaluation and management service by telephone and the availability of in person appointments. I also discussed with the patient that there may be a patient responsible charge related to this service. The patient expressed understanding and agreed to proceed.   Location of Patient: Home  Location of Provider: Clinic   Persons participating in Telemedicine visit: Elvenia Godden Longfellow - student Dr. Margarita Rana     History of Present Illness: Margaret Henry is a 70 year old woman with a history of COPD, previous tobacco abuse, GERD, hyperlipidemia, chronic respiratory failure (of 2 L of oxygen at rest) who presents today for a an acute visit. Advised by her cardiologist to follow-up with her PCP due to findings of anemia with a hemoglobin of 10.1 on her blood work. Denies presence of lower or upper GI bleed She was taking Ferrous sulfate but discontinued it when she noticed dark stools.   Complains of urine being smelly for quite sometime and she is unable to describe her symptoms. She endorses presence of urinary frequency but no dysuria, vaginal discharge. States she is not able to come into the Clinic to drop off a urine specimen or vaginal ancillary test due to the fact that her son is a truck driver and she is only able to come to the clinic when he can bring her.  Past Medical History:  Diagnosis Date  . Allergy    hayfever  . Bronchitis   . Cancer ([place])    skin cancer on chest  . Colon polyps   . Complication of anesthesia    pt states was given too much during nasal surgery 1989; difficulty getting awake  . COPD (chronic obstructive pulmonary disease) ([place])    . Coronary artery calcification   . Diverticulitis   . GERD (gastroesophageal reflux disease)    occasional  . Hemorrhoids   . High cholesterol   . Hyperlipidemia LDL goal <70   . Mild aortic insufficiency   . Osteoporosis   . Pneumonia   . Shortness of breath dyspnea    with exertion   . Thyroid goiter    bx benign  . Tobacco abuse   . Vertigo    Allergies  Allergen Reactions  . Hydrocodone Nausea And Vomiting  . Propoxyphene N-Acetaminophen Nausea And Vomiting    Current Outpatient Medications on File Prior to Visit  Medication Sig Dispense Refill  . acetaminophen (TYLENOL) 500 MG tablet Take 1,500 mg by mouth every 4 (four) hours as needed for headache.     . albuterol (PROVENTIL) (2.5 MG/3ML) 0.083% nebulizer solution TAKE 3 MLS BY NEBULIZATION EVERY 6 HOURS AS NEEDED FOR WHEEZING OR SHORTNESS OF BREATH. 75 mL 2  . albuterol (VENTOLIN HFA) 108 (90 Base) MCG/ACT inhaler Inhale 2 puffs into the lungs every 6 (six) hours as needed for wheezing or shortness of breath. 18 g 2  . aspirin (ASPIRIN ADULT LOW STRENGTH) 81 MG EC tablet TAKE 1 TABLET (81 MG TOTAL) BY MOUTH DAILY. 90 tablet 1  . atorvastatin (LIPITOR) 40 MG tablet Take 1 tablet (40 mg total) by mouth daily. 90 tablet 3  . cholecalciferol (VITAMIN D3) 25 MCG (1000 UNIT) tablet Take 1,000  Units by mouth daily.    . famotidine (PEPCID) 20 MG tablet Take 1 tablet by mouth daily as needed.    . ferrous sulfate 325 (65 FE) MG EC tablet Take 325 mg by mouth every other day.    . fluticasone (FLONASE) 50 MCG/ACT nasal spray USE 2 SPRAYS IN EACH NOSTRIL EVERY DAY 16 g 6  . Fluticasone-Umeclidin-Vilant (TRELEGY ELLIPTA) 200-62.5-25 MCG/INH AEPB Inhale 1 puff into the lungs daily. 60 each 5  . loratadine (CLARITIN) 10 MG tablet TAKE 1 TABLET (10 MG TOTAL) BY MOUTH DAILY. 100 tablet 2  . OXYGEN Inhale 2 L into the lungs.    . predniSONE (DELTASONE) 20 MG tablet Take 2 tablets once daily with food for 5 days then stop. 10 tablet 0    No current facility-administered medications on file prior to visit.    ROS: See HPI  Observations/Objective: Awake, alert, and 2x3 Not in acute distress  CBC    Component Value Date/Time   WBC 7.8 11/18/2020 1201   WBC 12.9 (H) 12/04/2018 0504   RBC 3.89 11/18/2020 1201   RBC 3.37 (L) 12/04/2018 0504   HGB 10.1 (L) 11/18/2020 1201   HCT 32.4 (L) 11/18/2020 1201   PLT 373 11/18/2020 1201   MCV 83 11/18/2020 1201   MCH 26.0 (L) 11/18/2020 1201   MCH 30.9 12/04/2018 0504   MCHC 31.2 (L) 11/18/2020 1201   MCHC 31.6 12/04/2018 0504   RDW 15.4 11/18/2020 1201   LYMPHSABS 0.6 (L) 12/03/2018 0455   MONOABS 0.1 12/03/2018 0455   EOSABS 0.0 12/03/2018 0455   BASOSABS 0.0 12/03/2018 0455    Assessment and Plan: 1. Anemia, unspecified type Hemoglobin of 10.1 Asymptomatic No explainable evidence of underlying chronic etiology Counseled on ingestion of iron rich foods Referred for colonoscopy - Ambulatory referral to Gastroenterology  2. Abnormal urine odor Nonspecific symptom. Advised that she will need to drop off a urine specimen Vaginal ancillary test will also be needed Once her son is in town she will call the clinic to schedule an appointment so test can be run.   Follow Up Instructions: Keep previously scheduled appointment   I discussed the assessment and treatment plan with the patient. The patient was provided an opportunity to ask questions and all were answered. The patient agreed with the plan and demonstrated an understanding of the instructions.   The patient was advised to call back or seek an in-person evaluation if the symptoms worsen or if the condition fails to improve as anticipated.     I provided 11 minutes total of non-face-to-face time during this encounter.   [name], MD, FAAFP. [facility] [place], [place]   12/19/2020, 2:42 PM

## 2020-12-21 ENCOUNTER — Other Ambulatory Visit: Payer: Self-pay

## 2020-12-21 MED ORDER — BREO ELLIPTA 200-25 MCG/INH IN AEPB: 1.0000 | INHALATION_SPRAY | Freq: Every day | RESPIRATORY_TRACT | 3 refills | Status: DC

## 2021-01-24 ENCOUNTER — Other Ambulatory Visit: Payer: Self-pay

## 2021-01-24 ENCOUNTER — Encounter: Payer: Self-pay | Admitting: Pulmonary Disease

## 2021-01-24 ENCOUNTER — Ambulatory Visit (INDEPENDENT_AMBULATORY_CARE_PROVIDER_SITE_OTHER): Payer: Medicare HMO | Admitting: Pulmonary Disease

## 2021-01-24 VITALS — BP 138/66 | HR 103 | Temp 97.5°F | Ht 64.0 in | Wt 124.8 lb

## 2021-01-24 DIAGNOSIS — J449 Chronic obstructive pulmonary disease, unspecified: Secondary | ICD-10-CM

## 2021-01-24 NOTE — Patient Instructions (Addendum)
Continue the Breo and Spiriva as prescribed Continue supplemental oxygen You have a follow-up CT scheduled in the next few months  Televisit in 6 months

## 2021-01-24 NOTE — Progress Notes (Signed)
Margaret Henry    [id number]    1951-05-05  Primary Care Physician:Newlin, Charlane Ferretti, MD  Referring Physician: [name], MD [place],  [place] [address]  Chief complaint:   Follow up for COPD GOLD D  HPI: 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 Her symptoms consist mainly of shortness of breath, daily cough with whitish sputum production, wheeze, dyspnea and exertion. She does not have any hemoptysis, chest pain, palpitations. No fevers, chills.  She is currently maintained on Spiriva and breo.  Tried on Trelegy inhaler in 2019 but had to go back to [place] and Spiriva since it made her breathing worse.  Also tried on Daliresp in 2019 but had to stop due to side effects of nausea  She had smoked one pack per day for nearly 25 years. Quit in 2020. 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 She does not recall any particular exposures at work or at home.  Interim history: Continues on Breo and Spiriva She had tried Trelegy again earlier this year but it made her breathing worse  Has chronic dyspnea on exertion that is unchanged. She got a course of prednisone in March 2022 for mild COPD exacerbation.  Outpatient Encounter Medications as of 01/24/2021  Medication Sig  . acetaminophen (TYLENOL) 500 MG tablet Take 1,500 mg by mouth every 4 (four) hours as needed for headache.   . albuterol (PROVENTIL) (2.5 MG/3ML) 0.083% nebulizer solution TAKE 3 MLS BY NEBULIZATION EVERY 6 HOURS AS NEEDED FOR WHEEZING OR SHORTNESS OF BREATH.  Marland Kitchen albuterol (VENTOLIN HFA) 108 (90 Base) MCG/ACT inhaler Inhale 2 puffs into the lungs every 6 (six) hours as needed for wheezing or shortness of breath.  Marland Kitchen aspirin (ASPIRIN ADULT LOW STRENGTH) 81 MG EC tablet TAKE 1 TABLET (81 MG TOTAL) BY MOUTH DAILY.  Marland Kitchen atorvastatin (LIPITOR) 40 MG tablet Take 1 tablet (40 mg total) by mouth daily.  .  cholecalciferol (VITAMIN D3) 25 MCG (1000 UNIT) tablet Take 1,000 Units by mouth daily.  . ferrous sulfate 325 (65 FE) MG EC tablet Take 325 mg by mouth every other day.  . fluticasone (FLONASE) 50 MCG/ACT nasal spray USE 2 SPRAYS IN EACH NOSTRIL EVERY DAY  . fluticasone furoate-vilanterol (BREO ELLIPTA) 200-25 MCG/INH AEPB Inhale 1 puff into the lungs daily.  Marland Kitchen loratadine (CLARITIN) 10 MG tablet TAKE 1 TABLET (10 MG TOTAL) BY MOUTH DAILY.  Marland Kitchen OXYGEN Inhale 2 L into the lungs.  . famotidine (PEPCID) 20 MG tablet Take 1 tablet by mouth daily as needed.  . predniSONE (DELTASONE) 20 MG tablet TAKE 2 TABLETS BY MOUTH ONCE DAILY WITH FOOD FOR 5 DAYS THEN STOP (Patient not taking: Reported on 01/24/2021)   No facility-administered encounter medications on file as of 01/24/2021.   Physical Exam: Blood pressure (!) 130/58, pulse 88, height 5\' 4"  (1.626 m), weight 96 lb 6.4 oz (43.7 kg), SpO2 90 %. Gen:      No acute distress HEENT:  EOMI, sclera anicteric Neck:     No masses; no thyromegaly Lungs:    Diminished air entry, no wheeze CV:         Regular rate and rhythm; no murmurs Abd:      + bowel sounds; soft, non-tender; no palpable masses, no distension Ext:    No edema; adequate peripheral perfusion Skin:  Warm and dry; no rash Neuro: alert and oriented x 3 Psych: normal mood and affect  Data Reviewed: Imaging: Screening CT chest 03/07/18- calcified right thyroid nodule.  Severe centrilobular emphysema, bronchial wall thickening.  Previously described 4.8 mm right lower lobe nodule now measures 2.9 mm.  Granulomatous splenic and liver calcification.  CT 10/20/2019-resolution of left lower lobe pulmonary nodule, no other lung abnormality  Low-dose screening CT 10/20/2020-new 5.9 mm right lower lobe pulmonary nodule, emphysema, atherosclerosis, gallstones. I have reviewed the images personally.  PFTs (07/21/15) FVC 2.06 66%), FEV1 0.93 (39%), F/F 45, DLCO 36% Unable complete  pleth. Severe obstructive lung disease, severe diffusion impairment  Spirometry 04/12/16 FVC 1.79 [5 7%), FEV1 0.65 (27%), F/F 36 Very severe obstructive airway disease  Labs: CBC 12/03/2018-WBC 9.2, eos 0% ID 10/16/18-1931 Alpha-1 antitrypsin 11/05/2018-171, PI MM  Assessment:  Severe COPD,Gold Stage D, Chronic bronchitis Continues on Breo and Spiriva.   I am not sure if she is generating enough inspiratory flow for the inhalers.  We discussed switching over to nebulizers but she would like to hold off on this change for now.  Has not tolerated Daliresp due to side effects Continue supplemental oxygen  She has finished pulmonary rehab 2 times already.  Encouraged her to stay active at home  Pulmonary nodule New nodule noted in January.  Follow-up CT ordered for 6 months  Plan/Recommendations: - Continue Breo, Spiriva - Follow-up CT chest   Marshell Garfinkel MD [facility] 01/24/2021, 12:10 PM  CC: [name], MD

## 2021-02-15 DIAGNOSIS — Z79899 Other long term (current) drug therapy: Secondary | ICD-10-CM | POA: Diagnosis not present

## 2021-02-16 LAB — LIPID PANEL
Chol/HDL Ratio: 2.5 ratio (ref 0.0–4.4)
Cholesterol, Total: 147 mg/dL (ref 100–199)
HDL: 58 mg/dL (ref >39)
LDL Chol Calc (NIH): 70 mg/dL (ref 0–99)
Triglycerides: 108 mg/dL (ref 0–149)
VLDL Cholesterol Cal: 19 mg/dL (ref 5–40)

## 2021-02-16 LAB — HEMOGLOBIN A1C
Est. average glucose Bld gHb Est-mCnc: 123 mg/dL
Hgb A1c MFr Bld: 5.9 % — ABNORMAL HIGH (ref 4.8–5.6)

## 2021-02-16 LAB — HEPATIC FUNCTION PANEL
ALT: 8 IU/L (ref 0–32)
AST: 12 IU/L (ref 0–40)
Albumin: 4.6 g/dL (ref 3.8–4.8)
Alkaline Phosphatase: 94 IU/L (ref 44–121)
Bilirubin Total: 0.2 mg/dL (ref 0.0–1.2)
Bilirubin, Direct: <0.1 mg/dL (ref 0.00–0.40)
Total Protein: 6.9 g/dL (ref 6.0–8.5)

## 2021-02-21 ENCOUNTER — Other Ambulatory Visit: Payer: Medicare HMO

## 2021-02-27 ENCOUNTER — Telehealth: Payer: Self-pay | Admitting: Pulmonary Disease

## 2021-02-27 MED ORDER — SPIRIVA RESPIMAT 2.5 MCG/ACT IN AERS: 2.0000 | INHALATION_SPRAY | Freq: Every day | RESPIRATORY_TRACT | 3 refills | Status: DC

## 2021-02-27 NOTE — Telephone Encounter (Signed)
I have called and spoke with pt and she is aware of rx that has been sent to the mail order pharmacy.  Nothing further is needed.

## 2021-03-08 DIAGNOSIS — R0781 Pleurodynia: Secondary | ICD-10-CM

## 2021-03-09 NOTE — Telephone Encounter (Signed)
Called spoke with patient.  She states Friday she was washing her over bent over the sink and ever since then her left side rib area hurts.  She is taking tylenol as needed.  She also has noticed that when she lays down she starts to cough a lot. She can't get anything up. She states this morning when she woke up she was really congested.  She has not taken a recent covid test, does not have one at the house to take, and hasn't been around anyone who has been sick. She is not running any fevers. She is wanted an xray to see if her ribs are bruised due to the pain she is having.    Sending to Dr. Vaughan Browner for recommendations.

## 2021-03-10 NOTE — Telephone Encounter (Signed)
Ok to order xray

## 2021-03-10 NOTE — Telephone Encounter (Signed)
Called patient. She is aware that Dr. Vaughan Browner is ok with the xray. She wishes to have this done at [facility] since she does not drive and her son is out of town. She will have her sister to take her. Order has been placed. Nothing further needed.

## 2021-03-13 ENCOUNTER — Ambulatory Visit (HOSPITAL_BASED_OUTPATIENT_CLINIC_OR_DEPARTMENT_OTHER)
Admission: RE | Admit: 2021-03-13 | Discharge: 2021-03-13 | Disposition: A | Discharge status: Home / Self Care | Payer: Medicare HMO | Source: Ambulatory Visit | Attending: Pulmonary Disease | Admitting: Pulmonary Disease

## 2021-03-13 ENCOUNTER — Other Ambulatory Visit: Payer: Self-pay

## 2021-03-13 DIAGNOSIS — R0781 Pleurodynia: Secondary | ICD-10-CM | POA: Diagnosis not present

## 2021-03-21 ENCOUNTER — Other Ambulatory Visit: Payer: Self-pay | Admitting: Family Medicine

## 2021-03-21 DIAGNOSIS — J42 Unspecified chronic bronchitis: Secondary | ICD-10-CM

## 2021-04-24 ENCOUNTER — Encounter (HOSPITAL_BASED_OUTPATIENT_CLINIC_OR_DEPARTMENT_OTHER): Payer: Medicare HMO

## 2021-04-24 ENCOUNTER — Other Ambulatory Visit: Payer: Self-pay

## 2021-04-24 ENCOUNTER — Ambulatory Visit (HOSPITAL_BASED_OUTPATIENT_CLINIC_OR_DEPARTMENT_OTHER)
Admission: RE | Admit: 2021-04-24 | Discharge: 2021-04-24 | Disposition: A | Discharge status: Home / Self Care | Payer: Medicare HMO | Source: Ambulatory Visit | Attending: Acute Care | Admitting: Acute Care

## 2021-04-24 DIAGNOSIS — J439 Emphysema, unspecified: Secondary | ICD-10-CM | POA: Diagnosis not present

## 2021-04-24 DIAGNOSIS — I700 Atherosclerosis of aorta: Secondary | ICD-10-CM | POA: Diagnosis not present

## 2021-04-24 DIAGNOSIS — Z87891 Personal history of nicotine dependence: Secondary | ICD-10-CM | POA: Diagnosis not present

## 2021-05-01 ENCOUNTER — Encounter: Payer: Self-pay | Admitting: Internal Medicine

## 2021-05-03 ENCOUNTER — Telehealth: Payer: Self-pay | Admitting: Pulmonary Disease

## 2021-05-03 ENCOUNTER — Other Ambulatory Visit (HOSPITAL_BASED_OUTPATIENT_CLINIC_OR_DEPARTMENT_OTHER): Payer: Self-pay

## 2021-05-03 MED ORDER — PREDNISONE 20 MG PO TABS
40.0000 mg | ORAL_TABLET | Freq: Every day | ORAL | 0 refills | Status: DC
  Filled 2021-05-03: qty 10, 5d supply, fill #0

## 2021-05-03 NOTE — Telephone Encounter (Signed)
Spoke to patient, who stated that she used liquid plumber on Monday. She is concerned that she developed a COPD flare from inhaling the liquid plumber.   She reports of increased sob with exertion, voice hoarseness, dry cough, wheezing and chest congestion x2d. Denied f/c/s or additional sx.  She is using albuterol solution BID, albuterol HFA BID, Spiriva once daily and Breo once daily.  Not vaccinated against covid. She is vaccinated against flu.  Her need for oxygen has increased.  Spo2 dropped to 85% on 2L with exertion yesterday.  She is requesting Rx for prednisone and abx.  Dr. Vaughan Browner, please advise.

## 2021-05-03 NOTE — Telephone Encounter (Signed)
Pt calling to see if Dr Vaughan Browner will send in anantibiotic and prednisone.Pt sinks were stopped up,and pt used Liquid Plumber and she thinks the smell aggravated your copd. Pt has had to use oxygen constantly instead of prn. Pt states she feels like she has an infection/breathing difficulty. Please advise [phone] Pharmacy-[facility] in [facility]

## 2021-05-03 NOTE — Telephone Encounter (Signed)
Called and spoke to pt. Informed her of the recs per Dr. Vaughan Browner. Rx sent to preferred pharmacy. Pt verbalized understanding and denied any further questions or concerns at this time.

## 2021-05-03 NOTE — Telephone Encounter (Signed)
Lm x1 for patient.  

## 2021-05-03 NOTE — Telephone Encounter (Signed)
Call in prednisone 40 mg/ day for 5 days 

## 2021-05-09 ENCOUNTER — Encounter: Payer: Self-pay | Admitting: *Deleted

## 2021-05-09 DIAGNOSIS — Z87891 Personal history of nicotine dependence: Secondary | ICD-10-CM

## 2021-05-09 NOTE — Progress Notes (Signed)
Please have him follow up with PCP. Thanks

## 2021-05-09 NOTE — Progress Notes (Signed)
Please call patient and let them  know their  low dose Ct was read as a Lung RADS 2: nodules that are benign in appearance and behavior with a very low likelihood of becoming a clinically active cancer due to size or lack of growth. Recommendation per radiology is for a repeat LDCT in 12 months. .Please let them  know we will order and schedule their  annual screening scan for 04/2022. Please let them  know there was notation of CAD on their  scan.  Please remind the patient  that this is a non-gated exam therefore degree or severity of disease  cannot be determined. Please have them  follow up with their PCP regarding potential risk factor modification, dietary therapy or pharmacologic therapy if clinically indicated. Pt.  is  currently on statin therapy. Please place order for annual  screening scan for  04/2022 and fax results to PCP. Thanks so much.  Aortic atherosclerosis, in addition to left main and 3 vessel coronary artery disease and calcifications of the aortic valve They are recommending an echo. Last echo in the system was 2018

## 2021-05-11 ENCOUNTER — Telehealth: Payer: Self-pay | Admitting: Acute Care

## 2021-05-12 NOTE — Telephone Encounter (Signed)
Call returned to patient, confirmed DOB. She states she was returning a call from a left message. I made her aware I did not see where anyone had tried contacting her but I did see a recent CT scan. I made her aware I would send a message to have them reach out to her. Voiced understanding.   Will route message to [place].

## 2021-05-12 NOTE — Telephone Encounter (Signed)
Spoke with pt and made sure she had received her CT result letter on MyChart. Pt stated that she had received the letter and is waiting to hear back from her PCP regarding the calcifications in her heart area. Pt had no further questions.

## 2021-05-15 NOTE — Telephone Encounter (Signed)
Will forward to to [facility] pool to schedule pt an appt is due to be seen this month ./cy

## 2021-05-15 NOTE — Telephone Encounter (Signed)
OK to transition to [place] team, does not necessarily come to [facility].

## 2021-05-16 ENCOUNTER — Other Ambulatory Visit: Payer: Self-pay

## 2021-05-16 DIAGNOSIS — K5792 Diverticulitis of intestine, part unspecified, without perforation or abscess without bleeding: Secondary | ICD-10-CM

## 2021-05-16 DIAGNOSIS — T8859XA Other complications of anesthesia, initial encounter: Secondary | ICD-10-CM

## 2021-05-16 DIAGNOSIS — J189 Pneumonia, unspecified organism: Secondary | ICD-10-CM | POA: Insufficient documentation

## 2021-05-16 DIAGNOSIS — K635 Polyp of colon: Secondary | ICD-10-CM | POA: Insufficient documentation

## 2021-05-16 DIAGNOSIS — T7840XA Allergy, unspecified, initial encounter: Secondary | ICD-10-CM | POA: Insufficient documentation

## 2021-05-16 DIAGNOSIS — I351 Nonrheumatic aortic (valve) insufficiency: Secondary | ICD-10-CM | POA: Insufficient documentation

## 2021-05-16 DIAGNOSIS — K649 Unspecified hemorrhoids: Secondary | ICD-10-CM | POA: Insufficient documentation

## 2021-05-16 DIAGNOSIS — C801 Malignant (primary) neoplasm, unspecified: Secondary | ICD-10-CM | POA: Insufficient documentation

## 2021-05-16 DIAGNOSIS — J40 Bronchitis, not specified as acute or chronic: Secondary | ICD-10-CM | POA: Insufficient documentation

## 2021-05-16 HISTORY — DX: Diverticulitis of intestine, part unspecified, without perforation or abscess without bleeding: K57.92

## 2021-05-16 HISTORY — DX: Other complications of anesthesia, initial encounter: T88.59XA

## 2021-05-17 ENCOUNTER — Other Ambulatory Visit: Payer: Self-pay

## 2021-05-17 ENCOUNTER — Ambulatory Visit (INDEPENDENT_AMBULATORY_CARE_PROVIDER_SITE_OTHER): Payer: Medicare HMO | Admitting: Cardiology

## 2021-05-17 ENCOUNTER — Encounter: Payer: Self-pay | Admitting: Cardiology

## 2021-05-17 VITALS — BP 130/56 | HR 106 | Ht 64.0 in | Wt 120.0 lb

## 2021-05-17 DIAGNOSIS — I7090 Unspecified atherosclerosis: Secondary | ICD-10-CM

## 2021-05-17 DIAGNOSIS — R0600 Dyspnea, unspecified: Secondary | ICD-10-CM | POA: Diagnosis not present

## 2021-05-17 DIAGNOSIS — I2510 Atherosclerotic heart disease of native coronary artery without angina pectoris: Secondary | ICD-10-CM | POA: Diagnosis not present

## 2021-05-17 DIAGNOSIS — R0609 Other forms of dyspnea: Secondary | ICD-10-CM

## 2021-05-17 DIAGNOSIS — E782 Mixed hyperlipidemia: Secondary | ICD-10-CM | POA: Diagnosis not present

## 2021-05-17 DIAGNOSIS — J449 Chronic obstructive pulmonary disease, unspecified: Secondary | ICD-10-CM | POA: Diagnosis not present

## 2021-05-17 NOTE — Patient Instructions (Signed)
Medication Instructions:  Your physician recommends that you continue on your current medications as directed. Please refer to the Current Medication list given to you today.  *If you need a refill on your cardiac medications before your next appointment, please call your pharmacy*   Lab Work: None If you have labs (blood work) drawn today and your tests are completely normal, you will receive your results only by: [place] (if you have MyChart) OR A paper copy in the mail If you have any lab test that is abnormal or we need to change your treatment, we will call you to review the results.   Testing/Procedures: Your physician has requested that you have a carotid duplex. This test is an ultrasound of the carotid arteries in your neck. It looks at blood flow through these arteries that supply the brain with blood. Allow one hour for this exam. There are no restrictions or special instructions.    Follow-Up: At [facility] [place], you and your health needs are our priority.  As part of our continuing mission to provide you with exceptional heart care, we have created designated Provider Care Teams.  These Care Teams include your primary Cardiologist (physician) and Advanced Practice Providers (APPs -  Physician Assistants and Nurse Practitioners) who all work together to provide you with the care you need, when you need it.  We recommend signing up for the patient portal called "MyChart".  Sign up information is provided on this After Visit Summary.  MyChart is used to connect with patients for Virtual Visits (Telemedicine).  Patients are able to view lab/test results, encounter notes, upcoming appointments, etc.  Non-urgent messages can be sent to your provider as well.   To learn more about what you can do with MyChart, go to [email or web address].    Your next appointment:   6 month(s)  The format for your next appointment:   In Person  Provider:   Jenne Campus,  MD   Other Instructions

## 2021-05-17 NOTE — Progress Notes (Signed)
Cardiology Office Note:    Date:  05/17/2021   ID:  Margaret Henry, DOB 1951-02-11, MRN [id number]  PCP:  Margaret Rakes, MD  Cardiologist:  Margaret Campus, MD    Referring MD: Margaret Rakes, MD   Chief Complaint  Patient presents with   Coronary Arteriosclerosis    History of Present Illness:    Margaret Henry is a 70 y.o. female who is a patient of Dr. Meda Henry.  Dr. Meda Henry left our area and she would like to be establish in my office as a patient.  She got complex past medical history the biggest problem is advanced COPD.  She is on oxygen all the time she is very sleepy home bound.  She does have history of calcification of the coronary artery.  In 2019 she did have a stress test after discovering of calcification of her coronary arteries, stress test was negative.  Since that time she denies have any chest pain tightness squeezing pressure burning chest.  However, recently her CT has been repeated for follow-up of some nodule in her lungs again calcification of being identified she was referred back to [place].  She denies have any typical chest pain tightness squeezing pressure burning chest.  She describes heartburn which happen after she drinks water or eats certain type of food.  This sensation is typically relieved by Tums.  She does not exercise any she also described to have some palpitations when she mean by dad's spitting up onto when she tried to leave and any effort.  Past Medical History:  Diagnosis Date   Allergy    hayfever   Bronchitis    Cancer ([place])    skin cancer on chest   Colon polyps    Complication of anesthesia    pt states was given too much during nasal surgery 1989; difficulty getting awake   COPD (chronic obstructive pulmonary disease) (HCC)    Coronary artery calcification    Diverticulitis    GERD (gastroesophageal reflux disease)    occasional   Hemorrhoids    High cholesterol    Hyperlipidemia LDL goal <70    Mild aortic insufficiency     Osteoporosis    Pneumonia    Shortness of breath dyspnea    with exertion    Thyroid goiter    bx benign   Tobacco abuse    Vertigo     Past Surgical History:  Procedure Laterality Date   ABDOMINAL HYSTERECTOMY     BILATERAL OOPHORECTOMY  2001   for benign ovarian mass    BIOPSY THYROID     DENTAL SURGERY     dentures   NASAL SEPTUM SURGERY     PARTIAL HYSTERECTOMY  1976   for heavy menses    RECTAL EXAM UNDER ANESTHESIA N/A 12/14/2015   Procedure: RECTAL EXAM UNDER ANESTHESIA REMOVAL OF ANAL CANAL MASS; INTERNAL HEMORRHOID LIGATION, EXTERNAL HEMORRHOID LIGATION;  Surgeon: Margaret Boston, MD;  Location: [facility];  Service: General;  Laterality: N/A;   TUBAL LIGATION     WISDOM TOOTH EXTRACTION      Current Medications: Current Meds  Medication Sig   acetaminophen (TYLENOL) 500 MG tablet Take 1,500 mg by mouth every 4 (four) hours as needed for headache.    albuterol (PROVENTIL) (2.5 MG/3ML) 0.083% nebulizer solution INHALE THE CONTENTS OF 1 VIAL VIA NEBULIZER EVERY 6 HOURS AS NEEDED FOR WHEEZING OR SHORTNESS OF BREATH (Patient taking differently: Take 2.5 mg by nebulization every 6 (six) hours as needed for shortness  of breath or wheezing. INHALE THE CONTENTS OF 1 VIAL VIA NEBULIZER EVERY 6 HOURS AS NEEDED FOR WHEEZING OR SHORTNESS OF BREATH)   albuterol (VENTOLIN HFA) 108 (90 Base) MCG/ACT inhaler Inhale 2 puffs into the lungs every 6 (six) hours as needed for wheezing or shortness of breath.   aspirin (ASPIRIN ADULT LOW STRENGTH) 81 MG EC tablet TAKE 1 TABLET (81 MG TOTAL) BY MOUTH DAILY. (Patient taking differently: Take 81 mg by mouth daily. TAKE 1 TABLET (81 MG TOTAL) BY MOUTH DAILY.)   atorvastatin (LIPITOR) 40 MG tablet Take 1 tablet (40 mg total) by mouth daily.   fluticasone (FLONASE) 50 MCG/ACT nasal spray USE 2 SPRAYS IN EACH NOSTRIL EVERY DAY   fluticasone furoate-vilanterol (BREO ELLIPTA) 200-25 MCG/INH AEPB Inhale 1 puff into the lungs daily.   loratadine (CLARITIN) 10 MG  tablet TAKE 1 TABLET (10 MG TOTAL) BY MOUTH DAILY.   OXYGEN Inhale 2 L into the lungs daily.   Tiotropium Bromide Monohydrate (SPIRIVA RESPIMAT) 2.5 MCG/ACT AERS Inhale 2 puffs into the lungs daily.     Allergies:   Hydrocodone and Propoxyphene n-acetaminophen   Social History   Socioeconomic History   Marital status: Widowed    Spouse name: Not on file   Number of children: Not on file   Years of education: Not on file   Highest education level: Not on file  Occupational History   Not on file  Tobacco Use   Smoking status: Former    Packs/day: 1.50    Years: 49.00    Pack years: 73.50    Types: Cigarettes    Quit date: 11/03/2018    Years since quitting: 2.5   Smokeless tobacco: Never  Vaping Use   Vaping Use: Never used  Substance and Sexual Activity   Alcohol use: No    Alcohol/week: 0.0 standard drinks   Drug use: No   Sexual activity: Not on file  Other Topics Concern   Not on file  Social History Narrative   Not on file   Social Determinants of Health   Financial Resource Strain: Not on file  Food Insecurity: Not on file  Transportation Needs: Not on file  Physical Activity: Not on file  Stress: Not on file  Social Connections: Not on file     Family History: The patient's family history includes COPD in her father; Heart disease in her mother; Liver cancer in her paternal grandmother; Lung cancer in her maternal grandfather. There is no history of Colon cancer, Colon polyps, Esophageal cancer, Rectal cancer, or Stomach cancer. ROS:   Please see the history of present illness.    All 14 point review of systems negative except as described per history of present illness  EKGs/Labs/Other Studies Reviewed:      Recent Labs: 11/18/2020: BUN 9; Creatinine, Ser 0.57; Hemoglobin 10.1; Platelets 373; Potassium 4.2; Sodium 140; TSH 0.663 02/15/2021: ALT 8  Recent Lipid Panel    Component Value Date/Time   CHOL 147 02/15/2021 1139   TRIG 108 02/15/2021 1139    HDL 58 02/15/2021 1139   CHOLHDL 2.5 02/15/2021 1139   CHOLHDL 3.1 09/05/2016 1218   VLDL 39 (H) 09/05/2016 1218   LDLCALC 70 02/15/2021 1139   LDLDIRECT 166 (H) 09/26/2015 1001    Physical Exam:    VS:  BP (!) 130/56 (BP Location: Right Arm, Patient Position: Sitting)   Pulse (!) 106   Ht '5\' 4"'$  (1.626 m)   Wt 120 lb (54.4 kg)   SpO2 Marland Kitchen)  83%   BMI 20.60 kg/m     Wt Readings from Last 3 Encounters:  05/17/21 120 lb (54.4 kg)  01/24/21 124 lb 12.8 oz (56.6 kg)  11/11/20 125 lb (56.7 kg)     GEN:  Well nourished, well developed in no acute distress HEENT: Normal NECK: No JVD; No carotid bruits LYMPHATICS: No lymphadenopathy CARDIAC: RRR, no murmurs, no rubs, no gallops RESPIRATORY:  Clear to auscultation without rales, poor air entry bilaterally, bilateral wheezes ABDOMEN: Soft, non-tender, non-distended MUSCULOSKELETAL:  No edema; No deformity  SKIN: Warm and dry LOWER EXTREMITIES: no swelling NEUROLOGIC:  Alert and oriented x 3 PSYCHIATRIC:  Normal affect   ASSESSMENT:    1. Atherosclerosis   2. Coronary arteriosclerosis   3. COPD, group D, by GOLD 2017 classification ([place])   4. Mixed hyperlipidemia   5. Dyspnea on exertion    PLAN:    In order of problems listed above:  Atherosclerosis she does have diffuse atherosclerosis as proven by CT showing calcification of the aorta as well as coronary arteries.  I will schedule him to have carotic ultrasounds make sure she does not have any significant carotid arterial stenosis.  Dyslipidemia, I did review her K PN data from 05/18/2021 showed LDL of 70 HDL 58 consistent with cholesterol profile.  This is on Lipitor 40 which I will continue.  We will also continue antiplatelet therapy. COPD advanced followed by pulmonary. Dyspnea on exertion undoubtedly COPD play significant role here. Overall quite [place] negative she does have advanced COPD related to 50 years smoking however she quit 3 years ago which I congratulated  her for and encouraged her to stay away from smoking   Medication Adjustments/Labs and Tests Ordered: Current medicines are reviewed at length with the patient today.  Concerns regarding medicines are outlined above.  Orders Placed This Encounter  Procedures   US Carotid Duplex Bilateral   EKG 12-Lead   Medication changes: No orders of the defined types were placed in this encounter.   Signed, Park Liter, MD, [facility] 05/17/2021 5:03 PM    [place]

## 2021-05-23 ENCOUNTER — Other Ambulatory Visit: Payer: Self-pay

## 2021-05-23 ENCOUNTER — Ambulatory Visit (HOSPITAL_BASED_OUTPATIENT_CLINIC_OR_DEPARTMENT_OTHER)
Admission: RE | Admit: 2021-05-23 | Discharge: 2021-05-23 | Disposition: A | Discharge status: Home / Self Care | Payer: Medicare HMO | Source: Ambulatory Visit | Attending: Cardiology | Admitting: Cardiology

## 2021-05-23 DIAGNOSIS — I7090 Unspecified atherosclerosis: Secondary | ICD-10-CM | POA: Insufficient documentation

## 2021-05-23 DIAGNOSIS — E785 Hyperlipidemia, unspecified: Secondary | ICD-10-CM | POA: Diagnosis not present

## 2021-05-23 DIAGNOSIS — H539 Unspecified visual disturbance: Secondary | ICD-10-CM | POA: Diagnosis not present

## 2021-05-23 DIAGNOSIS — I6523 Occlusion and stenosis of bilateral carotid arteries: Secondary | ICD-10-CM | POA: Diagnosis not present

## 2021-05-24 ENCOUNTER — Other Ambulatory Visit: Payer: Self-pay | Admitting: Family Medicine

## 2021-05-24 ENCOUNTER — Telehealth: Payer: Self-pay

## 2021-05-24 DIAGNOSIS — I2510 Atherosclerotic heart disease of native coronary artery without angina pectoris: Secondary | ICD-10-CM

## 2021-05-24 NOTE — Telephone Encounter (Signed)
Spoke with patient regarding results and recommendation.  Patient verbalizes understanding and is agreeable to plan of care. Advised patient to call back with any issues or concerns.  

## 2021-05-24 NOTE — Telephone Encounter (Signed)
-----   Message from Park Liter, MD sent at 05/24/2021  3:54 PM EDT ----- No significant stenosis in carotic circulation is identified

## 2021-05-24 NOTE — Telephone Encounter (Signed)
Left message on patients voicemail to please return our call.   

## 2021-05-24 NOTE — Telephone Encounter (Signed)
  Notes to clinic:  Requested script is expired  Review for continued use and refill    Requested Prescriptions  Pending Prescriptions Disp Refills   ASPIRIN LOW DOSE 81 MG EC tablet [Pharmacy Med Name: ASPIRIN LOW DOSE 81 MG Tablet Delayed Release] 90 tablet 1    Sig: TAKE 1 TABLET (81 MG TOTAL) BY MOUTH DAILY.     Analgesics:  NSAIDS - aspirin Passed - 05/24/2021 12:49 PM      Passed - Patient is not pregnant      Passed - Valid encounter within last 12 months    Recent Outpatient Visits           5 months ago Anemia, unspecified type   [place], Charlane Ferretti, MD   7 months ago Chronic respiratory failure with hypoxia [facility])   [place], [place], [place]   1 year ago Mixed hyperlipidemia   [place], [place], MD   1 year ago Chronic respiratory failure with hypoxia [facility])   [place] [name], MD   2 years ago COPD, group D, by GOLD 2017 classification [facility])   [place], Enobong, MD       Future Appointments             In 6 months Agustin Cree, Marily Lente, MD [facility]

## 2021-05-24 NOTE — Telephone Encounter (Signed)
Pt is returning a call about results

## 2021-06-13 ENCOUNTER — Other Ambulatory Visit: Payer: Self-pay

## 2021-06-13 ENCOUNTER — Inpatient Hospital Stay (HOSPITAL_COMMUNITY)
Admission: EM | Admit: 2021-06-13 | Discharge: 2021-06-18 | DRG: 189 | Disposition: A | Discharge status: Home Health | Payer: Medicare HMO | Attending: Internal Medicine | Admitting: Internal Medicine

## 2021-06-13 ENCOUNTER — Emergency Department (HOSPITAL_COMMUNITY): Payer: Medicare HMO

## 2021-06-13 DIAGNOSIS — J9621 Acute and chronic respiratory failure with hypoxia: Secondary | ICD-10-CM | POA: Diagnosis not present

## 2021-06-13 DIAGNOSIS — Z7951 Long term (current) use of inhaled steroids: Secondary | ICD-10-CM

## 2021-06-13 DIAGNOSIS — Z90710 Acquired absence of both cervix and uterus: Secondary | ICD-10-CM

## 2021-06-13 DIAGNOSIS — I351 Nonrheumatic aortic (valve) insufficiency: Secondary | ICD-10-CM | POA: Diagnosis not present

## 2021-06-13 DIAGNOSIS — Z87891 Personal history of nicotine dependence: Secondary | ICD-10-CM | POA: Diagnosis not present

## 2021-06-13 DIAGNOSIS — Z8249 Family history of ischemic heart disease and other diseases of the circulatory system: Secondary | ICD-10-CM | POA: Diagnosis not present

## 2021-06-13 DIAGNOSIS — R070 Pain in throat: Secondary | ICD-10-CM | POA: Diagnosis not present

## 2021-06-13 DIAGNOSIS — R0600 Dyspnea, unspecified: Secondary | ICD-10-CM | POA: Diagnosis not present

## 2021-06-13 DIAGNOSIS — I10 Essential (primary) hypertension: Secondary | ICD-10-CM | POA: Diagnosis not present

## 2021-06-13 DIAGNOSIS — J323 Chronic sphenoidal sinusitis: Secondary | ICD-10-CM | POA: Diagnosis not present

## 2021-06-13 DIAGNOSIS — K219 Gastro-esophageal reflux disease without esophagitis: Secondary | ICD-10-CM | POA: Diagnosis present

## 2021-06-13 DIAGNOSIS — Z9981 Dependence on supplemental oxygen: Secondary | ICD-10-CM

## 2021-06-13 DIAGNOSIS — J9611 Chronic respiratory failure with hypoxia: Secondary | ICD-10-CM | POA: Diagnosis present

## 2021-06-13 DIAGNOSIS — Z801 Family history of malignant neoplasm of trachea, bronchus and lung: Secondary | ICD-10-CM | POA: Diagnosis not present

## 2021-06-13 DIAGNOSIS — Z885 Allergy status to narcotic agent status: Secondary | ICD-10-CM

## 2021-06-13 DIAGNOSIS — W2201XA Walked into wall, initial encounter: Secondary | ICD-10-CM | POA: Diagnosis present

## 2021-06-13 DIAGNOSIS — E785 Hyperlipidemia, unspecified: Secondary | ICD-10-CM | POA: Diagnosis present

## 2021-06-13 DIAGNOSIS — S199XXA Unspecified injury of neck, initial encounter: Secondary | ICD-10-CM | POA: Diagnosis not present

## 2021-06-13 DIAGNOSIS — R000 Tachycardia, unspecified: Secondary | ICD-10-CM | POA: Diagnosis not present

## 2021-06-13 DIAGNOSIS — S0990XA Unspecified injury of head, initial encounter: Secondary | ICD-10-CM | POA: Diagnosis not present

## 2021-06-13 DIAGNOSIS — Z5329 Procedure and treatment not carried out because of patient's decision for other reasons: Secondary | ICD-10-CM | POA: Diagnosis present

## 2021-06-13 DIAGNOSIS — Z90722 Acquired absence of ovaries, bilateral: Secondary | ICD-10-CM | POA: Diagnosis not present

## 2021-06-13 DIAGNOSIS — Z66 Do not resuscitate: Secondary | ICD-10-CM | POA: Diagnosis not present

## 2021-06-13 DIAGNOSIS — J449 Chronic obstructive pulmonary disease, unspecified: Secondary | ICD-10-CM | POA: Diagnosis not present

## 2021-06-13 DIAGNOSIS — J439 Emphysema, unspecified: Secondary | ICD-10-CM | POA: Diagnosis not present

## 2021-06-13 DIAGNOSIS — E7800 Pure hypercholesterolemia, unspecified: Secondary | ICD-10-CM | POA: Diagnosis present

## 2021-06-13 DIAGNOSIS — J441 Chronic obstructive pulmonary disease with (acute) exacerbation: Secondary | ICD-10-CM | POA: Diagnosis present

## 2021-06-13 DIAGNOSIS — Z825 Family history of asthma and other chronic lower respiratory diseases: Secondary | ICD-10-CM

## 2021-06-13 DIAGNOSIS — I700 Atherosclerosis of aorta: Secondary | ICD-10-CM | POA: Diagnosis not present

## 2021-06-13 DIAGNOSIS — Z86010 Personal history of colonic polyps: Secondary | ICD-10-CM

## 2021-06-13 DIAGNOSIS — R55 Syncope and collapse: Secondary | ICD-10-CM | POA: Diagnosis not present

## 2021-06-13 DIAGNOSIS — Z800 Family history of malignant neoplasm of digestive organs: Secondary | ICD-10-CM

## 2021-06-13 DIAGNOSIS — R1110 Vomiting, unspecified: Secondary | ICD-10-CM | POA: Diagnosis not present

## 2021-06-13 DIAGNOSIS — E041 Nontoxic single thyroid nodule: Secondary | ICD-10-CM | POA: Diagnosis not present

## 2021-06-13 DIAGNOSIS — Z85828 Personal history of other malignant neoplasm of skin: Secondary | ICD-10-CM

## 2021-06-13 DIAGNOSIS — I444 Left anterior fascicular block: Secondary | ICD-10-CM | POA: Diagnosis present

## 2021-06-13 DIAGNOSIS — U071 COVID-19: Secondary | ICD-10-CM | POA: Diagnosis present

## 2021-06-13 DIAGNOSIS — R079 Chest pain, unspecified: Secondary | ICD-10-CM | POA: Diagnosis not present

## 2021-06-13 DIAGNOSIS — I672 Cerebral atherosclerosis: Secondary | ICD-10-CM | POA: Diagnosis not present

## 2021-06-13 DIAGNOSIS — R0689 Other abnormalities of breathing: Secondary | ICD-10-CM | POA: Diagnosis not present

## 2021-06-13 DIAGNOSIS — Z7982 Long term (current) use of aspirin: Secondary | ICD-10-CM

## 2021-06-13 DIAGNOSIS — J42 Unspecified chronic bronchitis: Secondary | ICD-10-CM | POA: Diagnosis not present

## 2021-06-13 DIAGNOSIS — I2510 Atherosclerotic heart disease of native coronary artery without angina pectoris: Secondary | ICD-10-CM | POA: Diagnosis present

## 2021-06-13 DIAGNOSIS — R0902 Hypoxemia: Secondary | ICD-10-CM | POA: Diagnosis not present

## 2021-06-13 DIAGNOSIS — R0602 Shortness of breath: Secondary | ICD-10-CM | POA: Diagnosis not present

## 2021-06-13 DIAGNOSIS — R059 Cough, unspecified: Secondary | ICD-10-CM | POA: Diagnosis not present

## 2021-06-13 DIAGNOSIS — K8020 Calculus of gallbladder without cholecystitis without obstruction: Secondary | ICD-10-CM | POA: Diagnosis not present

## 2021-06-13 DIAGNOSIS — I6523 Occlusion and stenosis of bilateral carotid arteries: Secondary | ICD-10-CM | POA: Diagnosis not present

## 2021-06-13 DIAGNOSIS — Z79899 Other long term (current) drug therapy: Secondary | ICD-10-CM

## 2021-06-13 LAB — COMPREHENSIVE METABOLIC PANEL
ALT: 13 U/L (ref 0–44)
AST: 22 U/L (ref 15–41)
Albumin: 3.2 g/dL — ABNORMAL LOW (ref 3.5–5.0)
Alkaline Phosphatase: 56 U/L (ref 38–126)
Anion gap: 6 (ref 5–15)
BUN: 16 mg/dL (ref 8–23)
CO2: 23 mmol/L (ref 22–32)
Calcium: 8.2 mg/dL — ABNORMAL LOW (ref 8.9–10.3)
Chloride: 106 mmol/L (ref 98–111)
Creatinine, Ser: 0.71 mg/dL (ref 0.44–1.00)
GFR, Estimated: >60 mL/min (ref >60)
Glucose, Bld: 119 mg/dL — ABNORMAL HIGH (ref 70–99)
Potassium: 3.7 mmol/L (ref 3.5–5.1)
Sodium: 135 mmol/L (ref 135–145)
Total Bilirubin: 0.6 mg/dL (ref 0.3–1.2)
Total Protein: 6 g/dL — ABNORMAL LOW (ref 6.5–8.1)

## 2021-06-13 LAB — CBC WITH DIFFERENTIAL/PLATELET
Abs Immature Granulocytes: 0.03 10*3/uL (ref 0.00–0.07)
Basophils Absolute: 0 10*3/uL (ref 0.0–0.1)
Basophils Relative: 0 %
Eosinophils Absolute: 0.2 10*3/uL (ref 0.0–0.5)
Eosinophils Relative: 3 %
HCT: 33.3 % — ABNORMAL LOW (ref 36.0–46.0)
Hemoglobin: 10 g/dL — ABNORMAL LOW (ref 12.0–15.0)
Immature Granulocytes: 1 %
Lymphocytes Relative: 10 %
Lymphs Abs: 0.6 10*3/uL — ABNORMAL LOW (ref 0.7–4.0)
MCH: 24.9 pg — ABNORMAL LOW (ref 26.0–34.0)
MCHC: 30 g/dL (ref 30.0–36.0)
MCV: 83 fL (ref 80.0–100.0)
Monocytes Absolute: 0.9 10*3/uL (ref 0.1–1.0)
Monocytes Relative: 16 %
Neutro Abs: 4.2 10*3/uL (ref 1.7–7.7)
Neutrophils Relative %: 70 %
Platelets: 281 10*3/uL (ref 150–400)
RBC: 4.01 MIL/uL (ref 3.87–5.11)
RDW: 19.9 % — ABNORMAL HIGH (ref 11.5–15.5)
WBC: 5.9 10*3/uL (ref 4.0–10.5)
nRBC: 0 % (ref 0.0–0.2)

## 2021-06-13 LAB — TRIGLYCERIDES: Triglycerides: 130 mg/dL (ref <150)

## 2021-06-13 LAB — GROUP A STREP BY PCR: Group A Strep by PCR: NOT DETECTED

## 2021-06-13 LAB — IRON AND TIBC
Iron: 16 ug/dL — ABNORMAL LOW (ref 28–170)
Saturation Ratios: 4 % — ABNORMAL LOW (ref 10.4–31.8)
TIBC: 392 ug/dL (ref 250–450)
UIBC: 376 ug/dL

## 2021-06-13 LAB — TROPONIN I (HIGH SENSITIVITY)
Troponin I (High Sensitivity): 3 ng/L (ref <18)
Troponin I (High Sensitivity): 3 ng/L (ref <18)

## 2021-06-13 LAB — BRAIN NATRIURETIC PEPTIDE: B Natriuretic Peptide: 9.9 pg/mL (ref 0.0–100.0)

## 2021-06-13 LAB — D-DIMER, QUANTITATIVE: D-Dimer, Quant: 11.6 ug/mL-FEU — ABNORMAL HIGH (ref 0.00–0.50)

## 2021-06-13 LAB — RESP PANEL BY RT-PCR (FLU A&B, COVID) ARPGX2
Influenza A by PCR: NEGATIVE
Influenza B by PCR: NEGATIVE
SARS Coronavirus 2 by RT PCR: POSITIVE — AB

## 2021-06-13 LAB — C-REACTIVE PROTEIN: CRP: 0.5 mg/dL (ref <1.0)

## 2021-06-13 LAB — FERRITIN: Ferritin: 17 ng/mL (ref 11–307)

## 2021-06-13 LAB — FIBRINOGEN: Fibrinogen: 384 mg/dL (ref 210–475)

## 2021-06-13 LAB — LACTIC ACID, PLASMA: Lactic Acid, Venous: 1.3 mmol/L (ref 0.5–1.9)

## 2021-06-13 LAB — LACTATE DEHYDROGENASE: LDH: 228 U/L — ABNORMAL HIGH (ref 98–192)

## 2021-06-13 LAB — LIPASE, BLOOD: Lipase: 44 U/L (ref 11–51)

## 2021-06-13 LAB — PROCALCITONIN: Procalcitonin: <0.1 ng/mL

## 2021-06-13 MED ORDER — ASCORBIC ACID 500 MG PO TABS
1000.0000 mg | ORAL_TABLET | Freq: Two times a day (BID) | ORAL | Status: DC
  Administered 2021-06-13 – 2021-06-15 (×3): 1000 mg via ORAL
  Filled 2021-06-13 (×5): qty 2

## 2021-06-13 MED ORDER — ZINC SULFATE 220 (50 ZN) MG PO CAPS
220.0000 mg | ORAL_CAPSULE | Freq: Every day | ORAL | Status: DC
  Administered 2021-06-13 – 2021-06-18 (×6): 220 mg via ORAL
  Filled 2021-06-13 (×6): qty 1

## 2021-06-13 MED ORDER — ONDANSETRON HCL 4 MG/2ML IJ SOLN
4.0000 mg | Freq: Once | INTRAMUSCULAR | Status: AC
  Administered 2021-06-13: 4 mg via INTRAVENOUS
  Filled 2021-06-13: qty 2

## 2021-06-13 MED ORDER — ENOXAPARIN SODIUM 40 MG/0.4ML IJ SOSY
40.0000 mg | PREFILLED_SYRINGE | INTRAMUSCULAR | Status: DC
  Administered 2021-06-13 – 2021-06-17 (×5): 40 mg via SUBCUTANEOUS
  Filled 2021-06-13 (×5): qty 0.4

## 2021-06-13 MED ORDER — FENTANYL CITRATE PF 50 MCG/ML IJ SOSY
50.0000 ug | PREFILLED_SYRINGE | Freq: Once | INTRAMUSCULAR | Status: AC
  Administered 2021-06-13: 50 ug via INTRAVENOUS
  Filled 2021-06-13: qty 1

## 2021-06-13 MED ORDER — MELATONIN 5 MG PO TABS
5.0000 mg | ORAL_TABLET | Freq: Every day | ORAL | Status: DC
  Administered 2021-06-13 – 2021-06-17 (×5): 5 mg via ORAL
  Filled 2021-06-13 (×5): qty 1

## 2021-06-13 MED ORDER — ONDANSETRON HCL 4 MG PO TABS: 4.0000 mg | ORAL_TABLET | Freq: Four times a day (QID) | ORAL | Status: DC | PRN

## 2021-06-13 MED ORDER — METHYLPREDNISOLONE SODIUM SUCC 125 MG IJ SOLR
60.0000 mg | Freq: Two times a day (BID) | INTRAMUSCULAR | Status: DC
  Administered 2021-06-13 – 2021-06-18 (×9): 60 mg via INTRAVENOUS
  Filled 2021-06-13 (×10): qty 2

## 2021-06-13 MED ORDER — ONDANSETRON HCL 4 MG/2ML IJ SOLN: 4.0000 mg | Freq: Four times a day (QID) | INTRAMUSCULAR | Status: DC | PRN

## 2021-06-13 MED ORDER — GUAIFENESIN-DM 100-10 MG/5ML PO SYRP
10.0000 mL | ORAL_SOLUTION | ORAL | Status: DC | PRN
  Administered 2021-06-16: 10 mL via ORAL
  Filled 2021-06-13: qty 10

## 2021-06-13 MED ORDER — ACETAMINOPHEN 325 MG PO TABS
650.0000 mg | ORAL_TABLET | Freq: Four times a day (QID) | ORAL | Status: DC | PRN
  Administered 2021-06-14: 650 mg via ORAL
  Filled 2021-06-13: qty 2

## 2021-06-13 MED ORDER — TRAMADOL HCL 50 MG PO TABS
50.0000 mg | ORAL_TABLET | Freq: Four times a day (QID) | ORAL | Status: DC | PRN
  Administered 2021-06-13 – 2021-06-17 (×6): 50 mg via ORAL
  Filled 2021-06-13 (×6): qty 1

## 2021-06-13 MED ORDER — ALBUTEROL SULFATE HFA 108 (90 BASE) MCG/ACT IN AERS
2.0000 | INHALATION_SPRAY | Freq: Four times a day (QID) | RESPIRATORY_TRACT | Status: DC
  Administered 2021-06-13 – 2021-06-14 (×5): 2 via RESPIRATORY_TRACT
  Filled 2021-06-13: qty 6.7

## 2021-06-13 MED ORDER — LIDOCAINE 5 % EX PTCH
1.0000 | MEDICATED_PATCH | CUTANEOUS | Status: DC
  Administered 2021-06-13 – 2021-06-17 (×5): 1 via TRANSDERMAL
  Filled 2021-06-13 (×5): qty 1

## 2021-06-13 MED ORDER — VITAMIN D 25 MCG (1000 UNIT) PO TABS
5000.0000 [IU] | ORAL_TABLET | Freq: Every day | ORAL | Status: DC
  Administered 2021-06-13 – 2021-06-16 (×4): 5000 [IU] via ORAL
  Filled 2021-06-13 (×5): qty 5

## 2021-06-13 MED ORDER — METHYLPREDNISOLONE SODIUM SUCC 125 MG IJ SOLR
125.0000 mg | Freq: Once | INTRAMUSCULAR | Status: AC
  Administered 2021-06-13: 125 mg via INTRAVENOUS
  Filled 2021-06-13: qty 2

## 2021-06-13 MED ORDER — IPRATROPIUM-ALBUTEROL 0.5-2.5 (3) MG/3ML IN SOLN
3.0000 mL | Freq: Once | RESPIRATORY_TRACT | Status: AC
  Administered 2021-06-13: 3 mL via RESPIRATORY_TRACT
  Filled 2021-06-13: qty 3

## 2021-06-13 MED ORDER — IOHEXOL 350 MG/ML SOLN
75.0000 mL | Freq: Once | INTRAVENOUS | Status: AC | PRN
  Administered 2021-06-13: 75 mL via INTRAVENOUS

## 2021-06-13 MED ORDER — DOXYCYCLINE HYCLATE 100 MG PO TABS
100.0000 mg | ORAL_TABLET | Freq: Two times a day (BID) | ORAL | Status: AC
  Administered 2021-06-13 – 2021-06-18 (×10): 100 mg via ORAL
  Filled 2021-06-13 (×10): qty 1

## 2021-06-13 MED ORDER — IPRATROPIUM-ALBUTEROL 0.5-2.5 (3) MG/3ML IN SOLN
3.0000 mL | Freq: Four times a day (QID) | RESPIRATORY_TRACT | Status: DC
  Administered 2021-06-13: 3 mL via RESPIRATORY_TRACT
  Filled 2021-06-13: qty 3

## 2021-06-13 MED ORDER — KETOROLAC TROMETHAMINE 30 MG/ML IJ SOLN
30.0000 mg | Freq: Once | INTRAMUSCULAR | Status: AC
  Administered 2021-06-13: 30 mg via INTRAVENOUS
  Filled 2021-06-13: qty 1

## 2021-06-13 NOTE — H&P (Signed)
History and Physical    Margaret Henry [id number] DOB: [birth year] DOA: 06/13/2021  PCP: [name], MD Consultants:  cardiology: Dr. Agustin Cree, pulmonology: Dr. Vaughan Browner Patient coming from:  Home - lives with alone  Chief Complaint: syncopal event   HPI: Margaret Henry is a 70 y.o. female with medical history significant of chronic COPD  with chronic respiratory failure on 2L oxygen prn, hyperlipidemia, GERD, coronary arterial sclerosis who presented for syncope and collapse.  She states she has been sick since Saturday. She states she had a sore throat, was achy and had some chills. She has not needed her oxygen for 2 days and felt good from a respiratory standpoint. She had no shortness of breath or cough from her baseline. She was fine on Monday and went to bed and slept with her oxygen on Monday night. She got up today, drank her coffee and went to her bedroom to turn her oxygen on because she felt winded. She ran into the wall, got her oxygen on and that is the last thing she remembers. She woke up and she set herself up and got back to her phone and was able to call her nephew. When EMS got to her house her oxygen was 90%. She feels like she may have broken a right rib.   She denies any fever, has had some chills. NO lightheadedness/dizziness, headache but has resolved on Monday, chest pain or palpitations, stomach pain. She has had some nausea, but no vomiting. She denies any leg swelling or rashes. She feels like her heart goes fast when she tries to walk or exert herself.   She denies any increased sputum and actually states it's decreased. Color has not changed. Viscosity has not changed. She is not really coughing more than her baseline either. Has not felt like she has needed her rescue inhaler and has not used. Last copd exacerbation in July of 2022.   Not covid vaccinated.   ED Course: vitals: Temp 100.1, blood pressure 118/57, heart rate 85, respiratory rate 21, oxygen 79% on  room air up to 100% on 5 L nasal cannula. Pertinent labs: Hemoglobin 10, COVID-positive, lactic acid 1.3, CT head no acute abnormality, CT cervical spine no acute abnormality, CTA negative for pulmonary embolus, chest x-ray with no acute finding, CT Abdo pelvis with no acute findings.  Given Toradol, DuoNeb, 125 mg of Solu-Medrol, and Zofran.  Placed on Airborne and contact precautions.  TRH was asked to admit.  Review of Systems: As per HPI; otherwise review of systems reviewed and negative.   Ambulatory Status:  Ambulates without assistance   Past Medical History:  Diagnosis Date   Allergy    hayfever   Bronchitis    Cancer ([place])    skin cancer on chest   Colon polyps    Complication of anesthesia    pt states was given too much during nasal surgery 1989; difficulty getting awake   COPD (chronic obstructive pulmonary disease) (HCC)    Coronary artery calcification    Diverticulitis    GERD (gastroesophageal reflux disease)    occasional   Hemorrhoids    High cholesterol    Hyperlipidemia LDL goal <70    Mild aortic insufficiency    Osteoporosis    Pneumonia    Shortness of breath dyspnea    with exertion    Thyroid goiter    bx benign   Tobacco abuse    Vertigo     Past Surgical History:  Procedure Laterality  Date   ABDOMINAL HYSTERECTOMY     BILATERAL OOPHORECTOMY  2001   for benign ovarian mass    BIOPSY THYROID     DENTAL SURGERY     dentures   NASAL SEPTUM SURGERY     PARTIAL HYSTERECTOMY  1976   for heavy menses    RECTAL EXAM UNDER ANESTHESIA N/A 12/14/2015   Procedure: RECTAL EXAM UNDER ANESTHESIA REMOVAL OF ANAL CANAL MASS; INTERNAL HEMORRHOID LIGATION, EXTERNAL HEMORRHOID LIGATION;  Surgeon: Michael Boston, MD;  Location: [facility];  Service: General;  Laterality: N/A;   TUBAL LIGATION     WISDOM TOOTH EXTRACTION      Social History   Socioeconomic History   Marital status: Widowed    Spouse name: Not on file   Number of children: Not on file   Years of  education: Not on file   Highest education level: Not on file  Occupational History   Not on file  Tobacco Use   Smoking status: Former    Packs/day: 1.50    Years: 49.00    Pack years: 73.50    Types: Cigarettes    Quit date: 11/03/2018    Years since quitting: 2.6   Smokeless tobacco: Never  Vaping Use   Vaping Use: Never used  Substance and Sexual Activity   Alcohol use: No    Alcohol/week: 0.0 standard drinks   Drug use: No   Sexual activity: Not on file  Other Topics Concern   Not on file  Social History Narrative   Not on file   Social Determinants of Health   Financial Resource Strain: Not on file  Food Insecurity: Not on file  Transportation Needs: Not on file  Physical Activity: Not on file  Stress: Not on file  Social Connections: Not on file  Intimate Partner Violence: Not on file    Allergies  Allergen Reactions   Hydrocodone Nausea And Vomiting   Propoxyphene N-Acetaminophen Nausea And Vomiting    Family History  Problem Relation Age of Onset   Heart disease Mother    COPD Father    Lung cancer Maternal Grandfather    Liver cancer Paternal Grandmother    Colon cancer Neg Hx    Colon polyps Neg Hx    Esophageal cancer Neg Hx    Rectal cancer Neg Hx    Stomach cancer Neg Hx     Prior to Admission medications   Medication Sig Start Date End Date Taking? Authorizing Provider  acetaminophen (TYLENOL) 500 MG tablet Take 1,500 mg by mouth every 4 (four) hours as needed for headache.     [provider]  albuterol (PROVENTIL) (2.5 MG/3ML) 0.083% nebulizer solution INHALE THE CONTENTS OF 1 VIAL VIA NEBULIZER EVERY 6 HOURS AS NEEDED FOR WHEEZING OR SHORTNESS OF BREATH Patient taking differently: Take 2.5 mg by nebulization every 6 (six) hours as needed for shortness of breath or wheezing. INHALE THE CONTENTS OF 1 VIAL VIA NEBULIZER EVERY 6 HOURS AS NEEDED FOR WHEEZING OR SHORTNESS OF BREATH 03/22/21   [name], MD  albuterol (VENTOLIN HFA)  108 (90 Base) MCG/ACT inhaler Inhale 2 puffs into the lungs every 6 (six) hours as needed for wheezing or shortness of breath. 02/11/20   [name], MD  aspirin (ASPIRIN ADULT LOW STRENGTH) 81 MG EC tablet TAKE 1 TABLET (81 MG TOTAL) BY MOUTH DAILY. Patient taking differently: Take 81 mg by mouth daily. TAKE 1 TABLET (81 MG TOTAL) BY MOUTH DAILY. 02/11/20   [name], MD  atorvastatin (LIPITOR) 40 MG tablet Take 1 tablet (40 mg total) by mouth daily. 11/22/20 05/17/21  Dunn, Nedra Hai, PA-C  cholecalciferol (VITAMIN D3) 25 MCG (1000 UNIT) tablet Take 1,000 Units by mouth daily. Patient not taking: Reported on 05/17/2021    [provider]  ferrous sulfate 325 (65 FE) MG EC tablet Take 325 mg by mouth every other day. Patient not taking: Reported on 05/17/2021    [provider]  fluticasone (FLONASE) 50 MCG/ACT nasal spray USE 2 SPRAYS IN EACH NOSTRIL EVERY DAY Patient taking differently: Place 2 sprays into both nostrils daily. 02/11/20   [name], MD  fluticasone furoate-vilanterol (BREO ELLIPTA) 200-25 MCG/INH AEPB Inhale 1 puff into the lungs daily. 12/21/20   Mannam, Hart Robinsons, MD  loratadine (CLARITIN) 10 MG tablet TAKE 1 TABLET (10 MG TOTAL) BY MOUTH DAILY. 12/17/18   Mannam, Hart Robinsons, MD  OXYGEN Inhale 2 L into the lungs daily.    [provider]  Tiotropium Bromide Monohydrate (SPIRIVA RESPIMAT) 2.5 MCG/ACT AERS Inhale 2 puffs into the lungs daily. 02/27/21   Marshell Garfinkel, MD    Physical Exam: Vitals:   06/13/21 1400 06/13/21 1427 06/13/21 1452 06/13/21 1645  BP: 133/63 (!) 142/70  (!) 141/76  Pulse: (!) 103 (!) 112  (!) 112  Resp:  20  (!) 22  Temp:   99.7 F (37.6 C)   TempSrc:   Oral   SpO2: 100% 93%  93%  Weight:      Height:         General:  Appears calm and comfortable and is in NAD Eyes:  PERRL, EOMI, normal lids, iris ENT:  grossly normal hearing, lips & tongue, mmm; dentures Neck:  no LAD, masses or thyromegaly; no carotid  bruits Cardiovascular:  RRR, no m/r/g. No LE edema.  Respiratory:   CTA bilaterally with no wheezes/rales/rhonchi.  Normal respiratory effort. Abdomen:  soft, NT, ND, NABS Back:   normal alignment, no CVAT Skin:  no rash or induration seen on limited exam Musculoskeletal:  grossly normal tone BUE/BLE, good ROM. TTP over right lateral rib cage  Lower extremity:  No LE edema.  Limited foot exam with no ulcerations.  2+ distal pulses. Psychiatric:  grossly normal mood and affect, speech fluent and appropriate, AOx3 Neurologic:  CN 2-12 grossly intact, moves all extremities in coordinated fashion, sensation intact    Radiological Exams on Admission: Independently reviewed - see discussion in A/P where applicable  DG Chest 2 View  Result Date: 06/13/2021 CLINICAL DATA:  Cough, chest pain, fall EXAM: CHEST - 2 VIEW COMPARISON:  Chest radiograph 04/12/2021 FINDINGS: The cardiomediastinal silhouette is stable with unchanged atherosclerotic plaque of the aortic arch. The lungs are hyperlucent consistent with emphysema. Streaky opacities in the lower lobe seen on the lateral projection are similar to the prior study. There is no new focal airspace disease. There is no pleural effusion or pneumothorax. There is no acute osseous abnormality. IMPRESSION: COPD. Otherwise, no radiographic evidence of acute cardiopulmonary process. Electronically Signed   By: Valetta Mole M.D.   On: 06/13/2021 14:14   CT HEAD WO CONTRAST (5MM)  Result Date: 06/13/2021 CLINICAL DATA:  Head trauma, moderate/severe. EXAM: CT HEAD WITHOUT CONTRAST TECHNIQUE: Contiguous axial images were obtained from the base of the skull through the vertex without intravenous contrast. COMPARISON:  MRI head 03/28/2012. FINDINGS: Brain: No evidence of acute infarction, hemorrhage, hydrocephalus, extra-axial collection or mass lesion/mass effect. Vascular: There are minimal atherosclerotic calcifications of the intracranial internal carotid arteries.  Skull: Normal. Negative for fracture or focal lesion. Sinuses/Orbits: Air-fluid levels are seen in posterior right ethmoid air cells and within the right sphenoid sinus. The mastoid air cells are clear. Other: None. IMPRESSION: 1. No acute intracranial abnormality. 2. Right ethmoid and right sphenoid sinusitis. Electronically Signed   By: Ronney Asters M.D.   On: 06/13/2021 15:54   CT Angio Chest PE W and/or Wo Contrast  Result Date: 06/13/2021 CLINICAL DATA:  Shortness of breath, syncope, fall, chest pain EXAM: CT ANGIOGRAPHY CHEST WITH CONTRAST TECHNIQUE: Multidetector CT imaging of the chest was performed using the standard protocol during bolus administration of intravenous contrast. Multiplanar CT image reconstructions and MIPs were obtained to evaluate the vascular anatomy. CONTRAST:  75m OMNIPAQUE IOHEXOL 350 MG/ML SOLN COMPARISON:  04/25/2021 FINDINGS: Cardiovascular: Pulmonary arteries are normal in caliber and patent. Negative for significant acute pulmonary embolus by CTA. Extensive calcific atherosclerosis of the aorta. No significant aneurysm. No acute dissection. Patent three-vessel arch anatomy. No mediastinal hemorrhage or hematoma. Heart size normal. Native coronary atherosclerosis. No pericardial effusion. Central venous structures are patent.  No veno-occlusive process. Mediastinum/Nodes: Stable dystrophic right thyroid calcification. Trachea and central airways are patent. Esophagus nondilated. No adenopathy. Lungs/Pleura: Severe diffuse emphysema pattern and hyperinflation. No acute airspace process, collapse consolidation. Minor basilar scarring. No superimposed edema pattern or CHF. No pleural abnormality, effusion, or pneumothorax. Upper Abdomen: Calcified gallstones noted. Extensive abdominal atherosclerosis. Punctate hepatic and splenic granulomata. Musculoskeletal: No acute osseous finding. Degenerative changes of the spine. No compression fracture. Motion artifact across the sternum.  Review of the MIP images confirms the above findings. IMPRESSION: Negative for significant acute pulmonary embolus by CTA. No other acute intrathoracic finding. Native coronary atherosclerosis Cholelithiasis Aortic Atherosclerosis (ICD10-I70.0) and Emphysema (ICD10-J43.9). Electronically Signed   By: MJerilynn Mages  Shick M.D.   On: 06/13/2021 15:48   CT Cervical Spine Wo Contrast  Result Date: 06/13/2021 CLINICAL DATA:  Neck trauma EXAM: CT CERVICAL SPINE WITHOUT CONTRAST TECHNIQUE: Multidetector CT imaging of the cervical spine was performed without intravenous contrast. Multiplanar CT image reconstructions were also generated. COMPARISON:  None. FINDINGS: Alignment: Straightening and mild reversal of the normal cervical lordosis, centered on C5-C6, which may be positional. Skull base and vertebrae: No acute fracture. No primary bone lesion or focal pathologic process. Soft tissues and spinal canal: No prevertebral fluid or swelling. No visible canal hematoma. Disc levels: Mild degenerative changes, without high-grade spinal canal stenosis or neural foraminal narrowing. Upper chest: For findings in the thorax, please see same day CT chest. Other: Calcified right thyroid nodule. IMPRESSION: No acute fracture or traumatic listhesis in the cervical spine. Electronically Signed   By: AMerilyn BabaM.D.   On: 06/13/2021 16:01   CT Abdomen Pelvis W Contrast  Result Date: 06/13/2021 CLINICAL DATA:  Nausea, vomiting, emesis, syncope, shortness of breath, fall EXAM: CT ABDOMEN AND PELVIS WITH CONTRAST TECHNIQUE: Multidetector CT imaging of the abdomen and pelvis was performed using the standard protocol following bolus administration of intravenous contrast. CONTRAST:  730mOMNIPAQUE IOHEXOL 350 MG/ML SOLN COMPARISON:  None. FINDINGS: Lower chest: No acute abnormality.  Coronary artery calcifications. Hepatobiliary: No solid liver abnormality is seen. Rim calcified gallstones in the gallbladder. Gallbladder wall thickening, or  biliary dilatation. Pancreas: Unremarkable. No pancreatic ductal dilatation or surrounding inflammatory changes. Spleen: Normal in size without significant abnormality. Adrenals/Urinary Tract: Adrenal glands are unremarkable. Kidneys are normal, without renal calculi, solid lesion, or hydronephrosis. Bladder is unremarkable. Stomach/Bowel: Stomach is within normal limits. Appendix appears normal. No evidence  of bowel wall thickening, distention, or inflammatory changes. Sigmoid diverticula. Vascular/Lymphatic: Severe aortic atherosclerosis. No enlarged abdominal or pelvic lymph nodes. Reproductive: Status post hysterectomy. Other: No abdominal wall hernia or abnormality. No abdominopelvic ascites. Musculoskeletal: No acute or significant osseous findings. IMPRESSION: 1. No acute CT findings of the abdomen or pelvis to explain nausea, vomiting, or emesis. 2. Cholelithiasis without evidence of acute cholecystitis. 3. Sigmoid diverticulosis without evidence of acute diverticulitis. Aortic Atherosclerosis (ICD10-I70.0). Electronically Signed   By: Eddie Candle M.D.   On: 06/13/2021 15:57    EKG: Independently reviewed.  NSR with rate 88 with LAFB; nonspecific ST changes with no evidence of acute ischemia. New LAFB, but artifact in EKG.    Labs on Admission: I have personally reviewed the available labs and imaging studies at the time of the admission.  Pertinent labs:  Hemoglobin 10,  COVID-positive,  lactic acid 1.3,    Assessment/Plan Principal Problem:   Syncope and collapse -70 year old female presenting with syncope and collapse found to have positive COVID-19 infection as well as possible COPD exacerbation and acute on chronic respiratory failure. -Place her on telemetry -Echocardiogram  -Orthostatics -CTA negative for PE  Active Problems:   COPD exacerbation (HCC) -Known COPD Gold D with chronic respiratory failure on 2 L of oxygen at home.  Has severe obstructive lung disease, severe  diffusion impairment. FEV1: 27% -Clinical history with decreased sputum,  no worsening cough, not necessarily consistent with exacerbation; does have increased oxygen need as well as COVID-19  infection which could exacerbate her COPD.  Responded well to DuoNeb in ER. -We will continue solumedrol twice daily -respiratory panel pending -Start doxycycline 100 mg twice daily x5 days due to severe disease -Scheduled DuoNebs x4 as long as a negative pressure room or if RT agrees. (Per pulm note: concerns that she did not generate enough inspiratory flow for the inhalers would prefer we do nebulizer's) -SABA prn  Continue her Spiriva and Breo.  She has been tried on Trelegy inhaler in 2019 but had to go back to [place] since it made her breathing worse.  Also tried on Daliresp but had side effects.    COVID-19 virus infection -covid labs pending. CTA negative for PE and CXR with no acute process  -unsure if acute on chronic respiratory failure is more from her COPD versus COVID or a combination of both or possible underlying cardiac condition with syncope -continue IV steroids BID -nutraceutical bundle with zinc, vitamin D, vitamin C and melatonin -discussed remdesivir as she is on day 4 of illness with multiple risk factors. Discussed risks/benefits of drug, EAU authorization. She declines at this time.     Acute on chronic respiratory failure with hypoxia (HCC) -Multifactorial.  question if due to COPD exacerbation on top of COVID-19 infection.  Overall imaging shows no signs of COVID-pneumonia. -Treating for COPD exacerbation -Echocardiogram pending -Tolerating down to 3 L of oxygen while in room -Continue to wean to baseline of 2 L. Usually sats at 92%.     Hyperlipidemia/atherosclerosis of aorta -Recent lipid panel 05/27/2021 showed LDL of 70 HDL 58. -Continue Lipitor and antiplatelet therapy  Hx of anemia As not been taking her oral iron  Iron studies pending   Body mass index  is 20.6 kg/m.   Level of care: Telemetry Medical DVT prophylaxis:  Lovenox  Code Status:  DNR- confirmed with patient Family Communication: None present Disposition Plan:  The patient is from: home  Anticipated d/c is to: home  Requires inpatient hospitalization and is at significant risk of worsening, requires constant monitoring, assessment and MDM with specialists.   Patient is currently: acutely ill Consults called: none  Admission status:  inpatient   Dragon dictation used in completing this note.    Orma Flaming MD [facility]   How to contact the [facility] Attending or Consulting provider [place] or covering provider during after hours [place], for this patient?  Check the care team in [facility] and look for a) attending/consulting [facility] provider listed and b) the [facility] team listed Log into [email or web address] and use 's universal password to access. If you do not have the password, please contact the hospital operator. Locate the [facility] provider you are looking for under [facility] and page to a number that you can be directly reached. If you still have difficulty reaching the provider, please page the [facility] (Director on Call) for the Hospitalists listed on amion for assistance.   06/13/2021, 5:16 PM

## 2021-06-13 NOTE — ED Triage Notes (Addendum)
Pt here via EMS from home with c/o fall with left sided rib. Pt states she has not been feeling well X2 days, got up to get her PRN O2 and may have had a syncopal episode. Endorses right rib pain 10/10. Placed on 5liters with EMS  EMS gave 4 mg Zofran 88mg fentanyl with some relief  140/74 HR 140. Initially. On arrival [id number] 152

## 2021-06-13 NOTE — ED Notes (Signed)
Patient transported to X-ray 

## 2021-06-13 NOTE — ED Provider Notes (Signed)
[facility] EMERGENCY DEPARTMENT Provider Note   CSN: [id number] Arrival date & time: 06/13/21  1209     History Chief Complaint  Patient presents with   Lytle Michaels    Margaret Henry is a 70 y.o. female with past medical history significant for severe COPD, hypertension, aortic insufficiency, chronic dyspnea on exertion who presents for evaluation of feeling unwell and possible syncope.  Was at home earlier today.  States she stood up because she felt like of breath to obtain her supplemental oxygen when she thinks she might have passed out.  She is unsure.  States she woke up on the ground.  Called out EMS.  Noted to be hypoxic into the low-mid 70s.  States she wears oxygen as needed at home, 2 L [place].  She has chronic cough.  She has been feeling unwell with sore throat, myalgias, headache, emesis over the last few days.  Unknown sick contacts.  No Unilateral leg swelling, redness or warmth.  She is not anticoagulated.  She is unsure if she hit her head during her possible syncope.  Denies any prior syncope.  States she does feel some chest tightness.  Denies any overt pain.  No fever, chills, vision changes, unilateral weakness, numbness, back pain.  Denies additional aggravating or alleviating factors.  History obtained from patient and past medical records.  No interpreter used.  Placed on 5 L [place] by EMS  HPI     Past Medical History:  Diagnosis Date   Allergy    hayfever   Bronchitis    Cancer ([place])    skin cancer on chest   Colon polyps    Complication of anesthesia    pt states was given too much during nasal surgery 1989; difficulty getting awake   COPD (chronic obstructive pulmonary disease) ([place])    Coronary artery calcification    Diverticulitis    GERD (gastroesophageal reflux disease)    occasional   Hemorrhoids    High cholesterol    Hyperlipidemia LDL goal <70    Mild aortic insufficiency    Osteoporosis    Pneumonia    Shortness of breath dyspnea     with exertion    Thyroid goiter    bx benign   Tobacco abuse    Vertigo     Patient Active Problem List   Diagnosis Date Noted   Pneumonia 05/16/2021   Mild aortic insufficiency 05/16/2021   Hemorrhoids 05/16/2021   Diverticulitis [id number]   Complication of anesthesia 05/16/2021   Colon polyps 05/16/2021   Cancer ([place]) 05/16/2021   Bronchitis 05/16/2021   Allergy 05/16/2021   Acute respiratory failure with hypoxia ([place]) 12/03/2018   COPD, group D, by GOLD 2017 classification ([place]) 10/03/2018   Overactive bladder 03/17/2018   Chronic respiratory failure ([place]) 10/04/2017   Vertigo 04/21/2017   Dyspnea 06/27/2016   Seborrheic keratoses 06/12/2016   Chest pain 06/12/2016   Coronary arteriosclerosis 09/26/2015   COPD exacerbation ([place]) 06/01/2013   Unspecified vitamin D deficiency 03/02/2010   Allergic rhinitis 03/02/2010   BACK PAIN, LUMBAR 03/02/2010   TROCHANTERIC BURSITIS, BILATERAL 03/02/2010   THYROID NODULE, RIGHT 11/09/2009   Hyperlipidemia 02/25/2009   GERD 02/25/2009   OSTEOPOROSIS 01/30/2009   COMPRESSION FRACTURE, SPINE 01/21/2009   COPD (chronic obstructive pulmonary disease) ([place]) 09/07/2008   Goiter, unspecified 10/08/1997    Past Surgical History:  Procedure Laterality Date   ABDOMINAL HYSTERECTOMY     BILATERAL OOPHORECTOMY  2001   for benign ovarian mass  BIOPSY THYROID     DENTAL SURGERY     dentures   NASAL SEPTUM SURGERY     PARTIAL HYSTERECTOMY  1976   for heavy menses    RECTAL EXAM UNDER ANESTHESIA N/A 12/14/2015   Procedure: RECTAL EXAM UNDER ANESTHESIA REMOVAL OF ANAL CANAL MASS; INTERNAL HEMORRHOID LIGATION, EXTERNAL HEMORRHOID LIGATION;  Surgeon: Michael Boston, MD;  Location: [facility] ORS;  Service: General;  Laterality: N/A;   TUBAL LIGATION     WISDOM TOOTH EXTRACTION       OB History   No obstetric history on file.     Family History  Problem Relation Age of Onset   Heart disease Mother    COPD Father    Lung cancer Maternal  Grandfather    Liver cancer Paternal Grandmother    Colon cancer Neg Hx    Colon polyps Neg Hx    Esophageal cancer Neg Hx    Rectal cancer Neg Hx    Stomach cancer Neg Hx     Social History   Tobacco Use   Smoking status: Former    Packs/day: 1.50    Years: 49.00    Pack years: 73.50    Types: Cigarettes    Quit date: 11/03/2018    Years since quitting: 2.6   Smokeless tobacco: Never  Vaping Use   Vaping Use: Never used  Substance Use Topics   Alcohol use: No    Alcohol/week: 0.0 standard drinks   Drug use: No    Home Medications Prior to Admission medications   Medication Sig Start Date End Date Taking? Authorizing Provider  acetaminophen (TYLENOL) 500 MG tablet Take 1,500 mg by mouth every 4 (four) hours as needed for headache.     [provider]  albuterol (PROVENTIL) (2.5 MG/3ML) 0.083% nebulizer solution INHALE THE CONTENTS OF 1 VIAL VIA NEBULIZER EVERY 6 HOURS AS NEEDED FOR WHEEZING OR SHORTNESS OF BREATH Patient taking differently: Take 2.5 mg by nebulization every 6 (six) hours as needed for shortness of breath or wheezing. INHALE THE CONTENTS OF 1 VIAL VIA NEBULIZER EVERY 6 HOURS AS NEEDED FOR WHEEZING OR SHORTNESS OF BREATH 03/22/21   [name], MD  albuterol (VENTOLIN HFA) 108 (90 Base) MCG/ACT inhaler Inhale 2 puffs into the lungs every 6 (six) hours as needed for wheezing or shortness of breath. 02/11/20   [name], MD  aspirin (ASPIRIN ADULT LOW STRENGTH) 81 MG EC tablet TAKE 1 TABLET (81 MG TOTAL) BY MOUTH DAILY. Patient taking differently: Take 81 mg by mouth daily. TAKE 1 TABLET (81 MG TOTAL) BY MOUTH DAILY. 02/11/20   [name], MD  atorvastatin (LIPITOR) 40 MG tablet Take 1 tablet (40 mg total) by mouth daily. 11/22/20 05/17/21  Dunn, Nedra Hai, PA-C  cholecalciferol (VITAMIN D3) 25 MCG (1000 UNIT) tablet Take 1,000 Units by mouth daily. Patient not taking: Reported on 05/17/2021    [provider]  ferrous sulfate 325 (65 FE) MG  EC tablet Take 325 mg by mouth every other day. Patient not taking: Reported on 05/17/2021    [provider]  fluticasone (FLONASE) 50 MCG/ACT nasal spray USE 2 SPRAYS IN EACH NOSTRIL EVERY DAY Patient taking differently: Place 2 sprays into both nostrils daily. 02/11/20   [name], MD  fluticasone furoate-vilanterol (BREO ELLIPTA) 200-25 MCG/INH AEPB Inhale 1 puff into the lungs daily. 12/21/20   Mannam, Hart Robinsons, MD  loratadine (CLARITIN) 10 MG tablet TAKE 1 TABLET (10 MG TOTAL) BY MOUTH DAILY. 12/17/18   Mannam, Praveen,  MD  OXYGEN Inhale 2 L into the lungs daily.    [provider]  Tiotropium Bromide Monohydrate (SPIRIVA RESPIMAT) 2.5 MCG/ACT AERS Inhale 2 puffs into the lungs daily. 02/27/21   Marshell Garfinkel, MD    Allergies    Hydrocodone and Propoxyphene n-acetaminophen  Review of Systems   Review of Systems  Constitutional:  Positive for activity change, appetite change, chills and fatigue.  HENT:  Positive for sore throat. Negative for congestion, ear discharge, ear pain, trouble swallowing and voice change.   Respiratory:  Positive for cough, shortness of breath and wheezing.   Cardiovascular: Negative.   Gastrointestinal:  Positive for nausea and vomiting. Negative for abdominal distention, abdominal pain, anal bleeding, blood in stool, constipation, diarrhea and rectal pain.  Genitourinary: Negative.   Musculoskeletal:  Positive for myalgias. Negative for arthralgias, back pain, gait problem, joint swelling, neck pain and neck stiffness.  Skin: Negative.   Neurological:  Positive for syncope, weakness and light-headedness. Negative for dizziness, seizures, facial asymmetry, speech difficulty, numbness and headaches.  All other systems reviewed and are negative.  Physical Exam Updated Vital Signs BP (!) 142/70   Pulse (!) 112   Temp 99.7 F (37.6 C) (Oral)   Resp 20   Ht '5\' 4"'$  (1.626 m)   Wt 54.4 kg   SpO2 93%   BMI 20.60 kg/m   Physical  Exam Vitals and nursing note reviewed.  Constitutional:      General: She is not in acute distress.    Appearance: She is well-developed. She is ill-appearing (Chronically ill appearing).  HENT:     Head: Atraumatic.     Nose: Nose normal.     Mouth/Throat:     Mouth: Mucous membranes are moist.     Comments: PO with mild erythema, No PTA, RPA Eyes:     Pupils: Pupils are equal, round, and reactive to light.  Neck:     Comments: Full ROM without difficulty Cardiovascular:     Rate and Rhythm: Normal rate.     Pulses: Normal pulses.     Heart sounds: Murmur heard.     Comments: Mild murmur Pulmonary:     Effort: No respiratory distress.     Comments: Overall decreased lung sounds. 5 L [place], Speaks in short sentences without difficulty Chest:       Comments: Diffuse tenderness to left anterior ribs Abdominal:     General: Bowel sounds are normal. There is no distension.     Palpations: Abdomen is soft.     Tenderness: There is no abdominal tenderness. There is no right CVA tenderness, left CVA tenderness, guarding or rebound.     Comments: Soft non tender  Musculoskeletal:        General: Normal range of motion.     Cervical back: Normal range of motion and neck supple.     Comments: No bony tenderness to BL Upper and lower extremities. No midline tenderness. No shortening or rotation of legs  Skin:    General: Skin is warm and dry.  Neurological:     General: No focal deficit present.     Mental Status: She is alert.     Comments: CN 2-12 grossly intact. No facial droop. Intact sensation. Equal grip  Psychiatric:        Mood and Affect: Mood normal.    ED Results / Procedures / Treatments   Labs (all labs ordered are listed, but only abnormal results are displayed) Labs Reviewed  CBC WITH  DIFFERENTIAL/PLATELET - Abnormal; Notable for the following components:      Result Value   Hemoglobin 10.0 (*)    HCT 33.3 (*)    MCH 24.9 (*)    RDW 19.9 (*)    Lymphs Abs 0.6  (*)    All other components within normal limits  COMPREHENSIVE METABOLIC PANEL - Abnormal; Notable for the following components:   Glucose, Bld 119 (*)    Calcium 8.2 (*)    Total Protein 6.0 (*)    Albumin 3.2 (*)    All other components within normal limits  GROUP A STREP BY PCR  RESP PANEL BY RT-PCR (FLU A&B, COVID) [id number]  CULTURE, BLOOD (ROUTINE X 2)  CULTURE, BLOOD (ROUTINE X 2)  LIPASE, BLOOD  LACTIC ACID, PLASMA  BRAIN NATRIURETIC PEPTIDE  TROPONIN I (HIGH SENSITIVITY)  TROPONIN I (HIGH SENSITIVITY)    EKG EKG Interpretation  Date/Time:  Tuesday June 13 2021 12:10:16 EDT Ventricular Rate:  88 PR Interval:  186 QRS Duration: 108 QT Interval:  384 QTC Calculation: 465 R Axis:   -52 Text Interpretation: Sinus rhythm Left anterior fascicular block Probable anteroseptal infarct, old Confirmed by Octaviano Glow [phone]) on 06/13/2021 12:53:09 PM  Radiology DG Chest 2 View  Result Date: 06/13/2021 CLINICAL DATA:  Cough, chest pain, fall EXAM: CHEST - 2 VIEW COMPARISON:  Chest radiograph 04/12/2021 FINDINGS: The cardiomediastinal silhouette is stable with unchanged atherosclerotic plaque of the aortic arch. The lungs are hyperlucent consistent with emphysema. Streaky opacities in the lower lobe seen on the lateral projection are similar to the prior study. There is no new focal airspace disease. There is no pleural effusion or pneumothorax. There is no acute osseous abnormality. IMPRESSION: COPD. Otherwise, no radiographic evidence of acute cardiopulmonary process. Electronically Signed   By: Valetta Mole M.D.   On: 06/13/2021 14:14    Procedures Procedures   Medications Ordered in ED Medications  ketorolac (TORADOL) 30 MG/ML injection 30 mg (has no administration in time range)  methylPREDNISolone sodium succinate (SOLU-MEDROL) 125 mg/2 mL injection 125 mg (125 mg Intravenous Given 06/13/21 1246)  ipratropium-albuterol (DUONEB) 0.5-2.5 (3) MG/3ML nebulizer solution 3 mL  (3 mLs Nebulization Given 06/13/21 1246)  ondansetron (ZOFRAN) injection 4 mg (4 mg Intravenous Given 06/13/21 1256)  fentaNYL (SUBLIMAZE) injection 50 mcg (50 mcg Intravenous Given 06/13/21 1300)  fentaNYL (SUBLIMAZE) injection 50 mcg (50 mcg Intravenous Given 06/13/21 1427)   ED Course  I have reviewed the triage vital signs and the nursing notes.  Pertinent labs & imaging results that were available during my care of the patient were reviewed by me and considered in my medical decision making (see chart for details).  Here for evaluation of syncope and hypoxia for not feeling well over the last few days.  On arrival she has low-grade temperature to 100.1.  Requiring supplemental oxygen up to 4 L via nasal cannula which is up from her as needed 2 L.  She is overall decreased lung sounds.  Active emesis in room.  Nonfocal neuro exam without deficits.  Tenderness to left ribs where she fell.  Plan on labs, imaging and reassess.  Labs and imaging personally reviewed and interpreted: CBC without leukocytosis, hgb 15 at baseline CMP glucose 119, calcium 8.2 Lactic 1.3 Lipase 44 Trop 3 BNP 9.9 EKG without ischemic changes DG chest COPD, no acute findings Strep neg  Patient reassessed. Able to titrate down to 4 L [place].  Care transfer to [place] who will FU on imaging and  remaining. Suspect patient will need admission for new persistent hypoxia and syncope WU.    MDM Rules/Calculators/A&P                           Margaret Henry was evaluated in Emergency Department on 06/13/2021 for the symptoms described in the history of present illness. She was evaluated in the context of the global COVID-19 pandemic, which necessitated consideration that the patient might be at risk for infection with the SARS-CoV-2 virus that causes COVID-19. Institutional protocols and algorithms that pertain to the evaluation of patients at risk for COVID-19 are in a state of rapid change based on information released by  regulatory bodies including the CDC and federal and state organizations. These policies and algorithms were followed during the patient's care in the ED.  Final Clinical Impression(s) / ED Diagnoses Final diagnoses:  Syncope, unspecified syncope type  Acute on chronic respiratory failure with hypoxia ([place])  Chronic obstructive pulmonary disease, unspecified COPD type [facility])    Rx / DC Orders ED Discharge Orders     None        Latonda Larrivee A, PA-C 06/13/21 1508    Wyvonnia Dusky, MD 06/13/21 [phone]

## 2021-06-13 NOTE — ED Provider Notes (Signed)
70 year old female with a history of COPD chronic hypoxemic respiratory failure on 2 L via nasal cannula who presents after several days of malaise and weakness with a syncopal event today.  She is found to have increased oxygen need now on 4 to 5 L and is COVID-positive.  I suspect this is more COPD exacerbation.  Patient will be admitted to the hospitalist service.  Discussed the case with Dr. Rogers Blocker.  She is stable throughout her ED visit with oxygen supplementation.   Margarita Mail, PA-C 06/14/21 1010    Pattricia Boss, MD 06/15/21 1345

## 2021-06-14 ENCOUNTER — Inpatient Hospital Stay (HOSPITAL_COMMUNITY): Payer: Medicare HMO

## 2021-06-14 DIAGNOSIS — J441 Chronic obstructive pulmonary disease with (acute) exacerbation: Secondary | ICD-10-CM

## 2021-06-14 DIAGNOSIS — R55 Syncope and collapse: Secondary | ICD-10-CM

## 2021-06-14 DIAGNOSIS — J9621 Acute and chronic respiratory failure with hypoxia: Principal | ICD-10-CM

## 2021-06-14 DIAGNOSIS — U071 COVID-19: Secondary | ICD-10-CM

## 2021-06-14 LAB — BLOOD CULTURE ID PANEL (REFLEXED) - BCID2

## 2021-06-14 LAB — ECHOCARDIOGRAM COMPLETE
AR max vel: 3.65 cm2
AV Area VTI: 3.24 cm2
AV Area mean vel: 2.86 cm2
AV Mean grad: 5 mmHg
AV Peak grad: 9.9 mmHg
Ao pk vel: 1.57 m/s
Area-P 1/2: 3.67 cm2
Height: 64 in
P 1/2 time: 1055 msec
S' Lateral: 2.5 cm
Single Plane A4C EF: 67.2 %
Weight: 1887.14 oz

## 2021-06-14 LAB — MAGNESIUM: Magnesium: 2.2 mg/dL (ref 1.7–2.4)

## 2021-06-14 LAB — CBC WITH DIFFERENTIAL/PLATELET
Abs Immature Granulocytes: 0.03 10*3/uL (ref 0.00–0.07)
Basophils Absolute: 0 10*3/uL (ref 0.0–0.1)
Basophils Relative: 0 %
Eosinophils Absolute: 0 10*3/uL (ref 0.0–0.5)
Eosinophils Relative: 0 %
HCT: 30.6 % — ABNORMAL LOW (ref 36.0–46.0)
Hemoglobin: 9.6 g/dL — ABNORMAL LOW (ref 12.0–15.0)
Immature Granulocytes: 1 %
Lymphocytes Relative: 9 %
Lymphs Abs: 0.5 10*3/uL — ABNORMAL LOW (ref 0.7–4.0)
MCH: 25.3 pg — ABNORMAL LOW (ref 26.0–34.0)
MCHC: 31.4 g/dL (ref 30.0–36.0)
MCV: 80.7 fL (ref 80.0–100.0)
Monocytes Absolute: 0.3 10*3/uL (ref 0.1–1.0)
Monocytes Relative: 6 %
Neutro Abs: 4.4 10*3/uL (ref 1.7–7.7)
Neutrophils Relative %: 84 %
Platelets: 310 10*3/uL (ref 150–400)
RBC: 3.79 MIL/uL — ABNORMAL LOW (ref 3.87–5.11)
RDW: 19.9 % — ABNORMAL HIGH (ref 11.5–15.5)
WBC: 5.2 10*3/uL (ref 4.0–10.5)
nRBC: 0 % (ref 0.0–0.2)

## 2021-06-14 LAB — COMPREHENSIVE METABOLIC PANEL
ALT: 11 U/L (ref 0–44)
AST: 22 U/L (ref 15–41)
Albumin: 3.3 g/dL — ABNORMAL LOW (ref 3.5–5.0)
Alkaline Phosphatase: 56 U/L (ref 38–126)
Anion gap: 7 (ref 5–15)
BUN: 15 mg/dL (ref 8–23)
CO2: 22 mmol/L (ref 22–32)
Calcium: 9.1 mg/dL (ref 8.9–10.3)
Chloride: 107 mmol/L (ref 98–111)
Creatinine, Ser: 0.71 mg/dL (ref 0.44–1.00)
GFR, Estimated: >60 mL/min (ref >60)
Glucose, Bld: 150 mg/dL — ABNORMAL HIGH (ref 70–99)
Potassium: 3.9 mmol/L (ref 3.5–5.1)
Sodium: 136 mmol/L (ref 135–145)
Total Bilirubin: 0.4 mg/dL (ref 0.3–1.2)
Total Protein: 6.4 g/dL — ABNORMAL LOW (ref 6.5–8.1)

## 2021-06-14 LAB — C-REACTIVE PROTEIN: CRP: <0.5 mg/dL (ref <1.0)

## 2021-06-14 LAB — FERRITIN: Ferritin: 17 ng/mL (ref 11–307)

## 2021-06-14 LAB — D-DIMER, QUANTITATIVE: D-Dimer, Quant: 3.66 ug/mL-FEU — ABNORMAL HIGH (ref 0.00–0.50)

## 2021-06-14 LAB — PHOSPHORUS: Phosphorus: 4.3 mg/dL (ref 2.5–4.6)

## 2021-06-14 MED ORDER — ALBUTEROL SULFATE HFA 108 (90 BASE) MCG/ACT IN AERS
2.0000 | INHALATION_SPRAY | Freq: Four times a day (QID) | RESPIRATORY_TRACT | Status: DC | PRN
  Administered 2021-06-15: 2 via RESPIRATORY_TRACT
  Filled 2021-06-14: qty 6.7

## 2021-06-14 MED ORDER — PANTOPRAZOLE SODIUM 40 MG PO TBEC
40.0000 mg | DELAYED_RELEASE_TABLET | Freq: Every day | ORAL | Status: DC
  Administered 2021-06-14 – 2021-06-18 (×5): 40 mg via ORAL
  Filled 2021-06-14 (×5): qty 1

## 2021-06-14 MED ORDER — ALBUTEROL SULFATE (2.5 MG/3ML) 0.083% IN NEBU: 3.0000 mL | INHALATION_SOLUTION | Freq: Two times a day (BID) | RESPIRATORY_TRACT | Status: DC

## 2021-06-14 NOTE — Progress Notes (Signed)
PROGRESS NOTE    Margaret Henry  [id number] DOB: [birth year] DOA: 06/13/2021 PCP: [name], MD    Chief Complaint  Patient presents with   Fall    Brief Narrative:   Margaret Henry is a 70 y.o. female with medical history significant of chronic COPD  with chronic respiratory failure on 2L oxygen prn, hyperlipidemia, GERD, coronary arterial sclerosis who presented for syncope and collapse.  She states she has been sick since Saturday. She states she had a sore throat, was achy and had some chills. She has not needed her oxygen for 2 days and felt good from a respiratory standpoint. She had no shortness of breath or cough from her baseline. She was fine on Monday and went to bed and slept with her oxygen on Monday night. She got up today, drank her coffee and went to her bedroom to turn her oxygen on because she felt winded. She ran into the wall, got her oxygen on and that is the last thing she remembers. She woke up and she set herself up and got back to her phone and was able to call her nephew. When EMS got to her house her oxygen was 90%. She feels like she may have broken a right rib.    She denies any fever, has had some chills. NO lightheadedness/dizziness, headache but has resolved on Monday, chest pain or palpitations, stomach pain. She has had some nausea, but no vomiting. She denies any leg swelling or rashes. She feels like her heart goes fast when she tries to walk or exert herself.    She denies any increased sputum and actually states it's decreased. Color has not changed. Viscosity has not changed. She is not really coughing more than her baseline either. Has not felt like she has needed her rescue inhaler and has not used. Last copd exacerbation in July of 2022.    Not covid vaccinated.    ED Course: vitals: Temp 100.1, blood pressure 118/57, heart rate 85, respiratory rate 21, oxygen 79% on room air up to 100% on 5 L nasal cannula. Pertinent labs: Hemoglobin 10,  COVID-positive, lactic acid 1.3, CT head no acute abnormality, CT cervical spine no acute abnormality, CTA negative for pulmonary embolus, chest x-ray with no acute finding, CT Abdo pelvis with no acute findings.  Given Toradol, DuoNeb, 125 mg of Solu-Medrol, and Zofran.  Placed on Airborne and contact precautions.  TRH was asked to admit.  Assessment & Plan:   Principal Problem:   Syncope and collapse Active Problems:   Hyperlipidemia   COPD exacerbation (HCC)   COVID-19 virus infection   Acute on chronic respiratory failure with hypoxia (HCC)   Syncope and collapse -70 year old female presenting with syncope and collapse found to have positive COVID-19 infection as well as possible COPD exacerbation and acute on chronic respiratory failure. -CTA negative for PE -2D echo with a preserved EF, grade 1 diastolic dysfunction, and mild to moderate aortic regurg -Continue to monitor on telemetry  Acute on chronic hypoxic respiratory failure -At baseline on 2 L nasal cannula, this morning she is on  5 L, this is most likely in the setting of COPDexacerbation more than COVID-19   COPD exacerbation (HCC) -Known COPD Gold D with chronic respiratory failure on 2 L of oxygen at home.  Has severe obstructive lung disease, severe diffusion impairment. FEV1: 27% -Clinical history with decreased sputum,  no worsening cough, not necessarily consistent with exacerbation; does have increased oxygen need as well  as COVID-19  infection which could exacerbate her COPD.  Responded well to DuoNeb in ER. -We will continue solumedrol twice daily -respiratory panel pending -Start doxycycline 100 mg twice daily x5 days due to severe disease -Scheduled DuoNebs x4 as long as a negative pressure room or if RT agrees. (Per pulm note: concerns that she did not generate enough inspiratory flow for the inhalers would prefer we do nebulizer's) -SABA prn  Continue her Spiriva and Breo.  She has been tried on Trelegy  inhaler in 2019 but had to go back to [place] since it made her breathing worse.  Also tried on Daliresp but had side effects. -She remains with significantly diminished air entry today, will keep on current dose IV steroids with no tapering.  1/4 blood cultures positive for gram-positive cocci -Most likely contamination, will continue to monitor on Doxycycline  with no broadening of antibiotics pending final results.     COVID-19 virus infection -covid labs pending. CTA negative for PE and CXR with no acute process  -unsure if acute on chronic respiratory failure is more from her COPD versus COVID or a combination of both or possible underlying cardiac condition with syncope -continue IV steroids BID -nutraceutical bundle with zinc, vitamin D, vitamin C and melatonin -I have discussed IV remdesivir with patient again today, and she is declining receiving remdesivir.      Hyperlipidemia/atherosclerosis of aorta -Recent lipid panel 05/27/2021 showed LDL of 70 HDL 58. -Continue Lipitor and antiplatelet therapy   Hx of anemia As not been taking her oral iron  Iron studies pending     DVT prophylaxis: Lovenox Code Status: DNR Family Communication: None at bedside Disposition:   Status is: Inpatient  Remains inpatient appropriate because:IV treatments appropriate due to intensity of illness or inability to take PO  Dispo: The patient is from: Home              Anticipated d/c is to: Home              Patient currently is not medically stable to d/c.   Difficult to place patient No       Consultants:  None   Subjective:  She still reports dyspnea, cough, denies any fever or chills.  Objective: Vitals:   06/13/21 2341 06/14/21 0320 06/14/21 0719 06/14/21 1146  BP: (!) 154/73 129/69 120/65 125/64  Pulse: (!) 101 83 95 93  Resp: '18 15 15 16  '$ Temp:   98 F (36.7 C) 98.1 F (36.7 C)  TempSrc:   Oral Oral  SpO2: 96% 99% 94% 94%  Weight:      Height:         Intake/Output Summary (Last 24 hours) at 06/14/2021 1522 Last data filed at 06/14/2021 1053 Gross per 24 hour  Intake 120 ml  Output --  Net 120 ml   Filed Weights   06/13/21 1218 06/13/21 2147  Weight: 54.4 kg 53.5 kg    Examination:  Awake Alert, Oriented X 3, No new F.N deficits, Normal affect, frail, chronically ill-appearing Symmetrical Chest wall movement, significantly diminished air entry bilaterally with some wheezing RRR,No Gallops,Rubs or new Murmurs, No Parasternal Heave +ve B.Sounds, Abd Soft, No tenderness, No rebound - guarding or rigidity. No Cyanosis, Clubbing or edema, No new Rash or bruise       Data Reviewed: I have personally reviewed following labs and imaging studies  CBC: Recent Labs  Lab 06/13/21 1240 06/14/21 0058  WBC 5.9 5.2  NEUTROABS 4.2  4.4  HGB 10.0* 9.6*  HCT 33.3* 30.6*  MCV 83.0 80.7  PLT 281 [id number]    Basic Metabolic Panel: Recent Labs  Lab 06/13/21 1240 06/14/21 0058  NA 135 136  K 3.7 3.9  CL 106 107  CO2 23 22  GLUCOSE 119* 150*  BUN 16 15  CREATININE 0.71 0.71  CALCIUM 8.2* 9.1  MG  --  2.2  PHOS  --  4.3    GFR: Estimated Creatinine Clearance: 54.5 mL/min (by C-G formula based on SCr of 0.71 mg/dL).  Liver Function Tests: Recent Labs  Lab 06/13/21 1240 06/14/21 0058  AST 22 22  ALT 13 11  ALKPHOS 56 56  BILITOT 0.6 0.4  PROT 6.0* 6.4*  ALBUMIN 3.2* 3.3*    CBG: No results for input(s): GLUCAP in the last 168 hours.   Recent Results (from the past 240 hour(s))  Resp Panel by RT-PCR (Flu A&B, Covid) Nasopharyngeal Swab     Status: Abnormal   Collection Time: 06/13/21 12:40 PM   Specimen: Nasopharyngeal Swab; Nasopharyngeal(NP) swabs in vial transport medium  Result Value Ref Range Status   SARS Coronavirus 2 by RT PCR POSITIVE (A) NEGATIVE Final    Comment: RESULT CALLED TO, READ BACK BY AND VERIFIED WITH: RN C.CHAPMAN AT [id number] ON 06/13/2021 BY T.SAAD. (NOTE) SARS-CoV-2 target nucleic acids are  DETECTED.  The SARS-CoV-2 RNA is generally detectable in upper respiratory specimens during the acute phase of infection. Positive results are indicative of the presence of the identified virus, but do not rule out bacterial infection or co-infection with other pathogens not detected by the test. Clinical correlation with patient history and other diagnostic information is necessary to determine patient infection status. The expected result is Negative.  Fact Sheet for Patients: EntrepreneurPulse.com.au  Fact Sheet for Healthcare Providers: IncredibleEmployment.be  This test is not yet approved or cleared by the [place] FDA and  has been authorized for detection and/or diagnosis of SARS-CoV-2 by FDA under an Emergency Use Authorization (EUA).  This EUA will remain in effect (meaning this  test can be used) for the duration of  the COVID-19 declaration under Section 564(b)(1) of the Act, 21 U.S.C. section 360bbb-3(b)(1), unless the authorization is terminated or revoked sooner.     Influenza A by PCR NEGATIVE NEGATIVE Final   Influenza B by PCR NEGATIVE NEGATIVE Final    Comment: (NOTE) The Xpert Xpress SARS-CoV-2/FLU/RSV plus assay is intended as an aid in the diagnosis of influenza from Nasopharyngeal swab specimens and should not be used as a sole basis for treatment. Nasal washings and aspirates are unacceptable for Xpert Xpress SARS-CoV-2/FLU/RSV testing.  Fact Sheet for Patients: EntrepreneurPulse.com.au  Fact Sheet for Healthcare Providers: IncredibleEmployment.be  This test is not yet approved or cleared by the [place] FDA and has been authorized for detection and/or diagnosis of SARS-CoV-2 by FDA under an Emergency Use Authorization (EUA). This EUA will remain in effect (meaning this test can be used) for the duration of the COVID-19 declaration under Section 564(b)(1) of the Act, 21  U.S.C. section 360bbb-3(b)(1), unless the authorization is terminated or revoked.  Performed at [facility], [place] [address], [place], [place] [address]   Blood culture (routine x 2)     Status: None (Preliminary result)   Collection Time: 06/13/21 12:41 PM   Specimen: BLOOD  Result Value Ref Range Status   Specimen Description BLOOD RIGHT ANTECUBITAL  Final   Special Requests   Final    BOTTLES  DRAWN AEROBIC AND ANAEROBIC Blood Culture results may not be optimal due to an inadequate volume of blood received in culture bottles   Culture   Final    NO GROWTH < 24 HOURS Performed at [facility] [address] [place], [place] [address]    Report Status PENDING  Incomplete  Blood culture (routine x 2)     Status: None (Preliminary result)   Collection Time: 06/13/21 12:41 PM   Specimen: BLOOD LEFT FOREARM  Result Value Ref Range Status   Specimen Description BLOOD LEFT FOREARM  Final   Special Requests   Final    BOTTLES DRAWN AEROBIC ONLY Blood Culture adequate volume   Culture  Setup Time   Final    GRAM POSITIVE COCCI AEROBIC BOTTLE ONLY CRITICAL RESULT CALLED TO, READ BACK BY AND VERIFIED WITH: PHARMD A LAWLESS [id number] AT 83 AM B Y CM Performed at [facility], [place] [address], [place], [place] [address]    Culture GRAM POSITIVE COCCI  Final   Report Status PENDING  Incomplete  Blood Culture ID Panel (Reflexed)     Status: Abnormal   Collection Time: 06/13/21 12:41 PM  Result Value Ref Range Status   Enterococcus faecalis NOT DETECTED NOT DETECTED Final   Enterococcus Faecium NOT DETECTED NOT DETECTED Final   Listeria monocytogenes NOT DETECTED NOT DETECTED Final   Staphylococcus species DETECTED (A) NOT DETECTED Final    Comment: CRITICAL RESULT CALLED TO, READ BACK BY AND VERIFIED WITH: PHARMD A LAWLESS [id number] AT 855 AM BY CM    Staphylococcus aureus (BCID) NOT DETECTED NOT DETECTED Final   Staphylococcus epidermidis NOT DETECTED NOT DETECTED Final    Staphylococcus lugdunensis NOT DETECTED NOT DETECTED Final   Streptococcus species NOT DETECTED NOT DETECTED Final   Streptococcus agalactiae NOT DETECTED NOT DETECTED Final   Streptococcus pneumoniae NOT DETECTED NOT DETECTED Final   Streptococcus pyogenes NOT DETECTED NOT DETECTED Final   A.calcoaceticus-baumannii NOT DETECTED NOT DETECTED Final   Bacteroides fragilis NOT DETECTED NOT DETECTED Final   Enterobacterales NOT DETECTED NOT DETECTED Final   Enterobacter cloacae complex NOT DETECTED NOT DETECTED Final   Escherichia coli NOT DETECTED NOT DETECTED Final   Klebsiella aerogenes NOT DETECTED NOT DETECTED Final   Klebsiella oxytoca NOT DETECTED NOT DETECTED Final   Klebsiella pneumoniae NOT DETECTED NOT DETECTED Final   Proteus species NOT DETECTED NOT DETECTED Final   Salmonella species NOT DETECTED NOT DETECTED Final   Serratia marcescens NOT DETECTED NOT DETECTED Final   Haemophilus influenzae NOT DETECTED NOT DETECTED Final   Neisseria meningitidis NOT DETECTED NOT DETECTED Final   Pseudomonas aeruginosa NOT DETECTED NOT DETECTED Final   Stenotrophomonas maltophilia NOT DETECTED NOT DETECTED Final   Candida albicans NOT DETECTED NOT DETECTED Final   Candida auris NOT DETECTED NOT DETECTED Final   Candida glabrata NOT DETECTED NOT DETECTED Final   Candida krusei NOT DETECTED NOT DETECTED Final   Candida parapsilosis NOT DETECTED NOT DETECTED Final   Candida tropicalis NOT DETECTED NOT DETECTED Final   Cryptococcus neoformans/gattii NOT DETECTED NOT DETECTED Final    Comment: Performed at [facility], [place]. [address] [place], [place] [address]  Group A Strep by PCR     Status: None   Collection Time: 06/13/21 12:56 PM   Specimen: Throat; Sterile Swab  Result Value Ref Range Status   Group A Strep by PCR NOT DETECTED NOT DETECTED Final    Comment: Performed at [facility], [address] [place], [place] [address]         Radiology Studies: DG Chest 2  View  Result Date: 06/13/2021 CLINICAL DATA:  Cough, chest pain, fall EXAM: CHEST - 2 VIEW COMPARISON:  Chest radiograph 04/12/2021 FINDINGS: The cardiomediastinal silhouette is stable with unchanged atherosclerotic plaque of the aortic arch. The lungs are hyperlucent consistent with emphysema. Streaky opacities in the lower lobe seen on the lateral projection are similar to the prior study. There is no new focal airspace disease. There is no pleural effusion or pneumothorax. There is no acute osseous abnormality. IMPRESSION: COPD. Otherwise, no radiographic evidence of acute cardiopulmonary process. Electronically Signed   By: Valetta Mole M.D.   On: 06/13/2021 14:14   CT HEAD WO CONTRAST (5MM)  Result Date: 06/13/2021 CLINICAL DATA:  Head trauma, moderate/severe. EXAM: CT HEAD WITHOUT CONTRAST TECHNIQUE: Contiguous axial images were obtained from the base of the skull through the vertex without intravenous contrast. COMPARISON:  MRI head 03/28/2012. FINDINGS: Brain: No evidence of acute infarction, hemorrhage, hydrocephalus, extra-axial collection or mass lesion/mass effect. Vascular: There are minimal atherosclerotic calcifications of the intracranial internal carotid arteries. Skull: Normal. Negative for fracture or focal lesion. Sinuses/Orbits: Air-fluid levels are seen in posterior right ethmoid air cells and within the right sphenoid sinus. The mastoid air cells are clear. Other: None. IMPRESSION: 1. No acute intracranial abnormality. 2. Right ethmoid and right sphenoid sinusitis. Electronically Signed   By: Ronney Asters M.D.   On: 06/13/2021 15:54   CT Angio Chest PE W and/or Wo Contrast  Result Date: 06/13/2021 CLINICAL DATA:  Shortness of breath, syncope, fall, chest pain EXAM: CT ANGIOGRAPHY CHEST WITH CONTRAST TECHNIQUE: Multidetector CT imaging of the chest was performed using the standard protocol during bolus administration of intravenous contrast. Multiplanar CT image reconstructions and MIPs  were obtained to evaluate the vascular anatomy. CONTRAST:  81m OMNIPAQUE IOHEXOL 350 MG/ML SOLN COMPARISON:  04/25/2021 FINDINGS: Cardiovascular: Pulmonary arteries are normal in caliber and patent. Negative for significant acute pulmonary embolus by CTA. Extensive calcific atherosclerosis of the aorta. No significant aneurysm. No acute dissection. Patent three-vessel arch anatomy. No mediastinal hemorrhage or hematoma. Heart size normal. Native coronary atherosclerosis. No pericardial effusion. Central venous structures are patent.  No veno-occlusive process. Mediastinum/Nodes: Stable dystrophic right thyroid calcification. Trachea and central airways are patent. Esophagus nondilated. No adenopathy. Lungs/Pleura: Severe diffuse emphysema pattern and hyperinflation. No acute airspace process, collapse consolidation. Minor basilar scarring. No superimposed edema pattern or CHF. No pleural abnormality, effusion, or pneumothorax. Upper Abdomen: Calcified gallstones noted. Extensive abdominal atherosclerosis. Punctate hepatic and splenic granulomata. Musculoskeletal: No acute osseous finding. Degenerative changes of the spine. No compression fracture. Motion artifact across the sternum. Review of the MIP images confirms the above findings. IMPRESSION: Negative for significant acute pulmonary embolus by CTA. No other acute intrathoracic finding. Native coronary atherosclerosis Cholelithiasis Aortic Atherosclerosis (ICD10-I70.0) and Emphysema (ICD10-J43.9). Electronically Signed   By: MJerilynn Mages  Shick M.D.   On: 06/13/2021 15:48   CT Cervical Spine Wo Contrast  Result Date: 06/13/2021 CLINICAL DATA:  Neck trauma EXAM: CT CERVICAL SPINE WITHOUT CONTRAST TECHNIQUE: Multidetector CT imaging of the cervical spine was performed without intravenous contrast. Multiplanar CT image reconstructions were also generated. COMPARISON:  None. FINDINGS: Alignment: Straightening and mild reversal of the normal cervical lordosis, centered on  C5-C6, which may be positional. Skull base and vertebrae: No acute fracture. No primary bone lesion or focal pathologic process. Soft tissues and spinal canal: No prevertebral fluid or swelling.  No visible canal hematoma. Disc levels: Mild degenerative changes, without high-grade spinal canal stenosis or neural foraminal narrowing. Upper chest: For findings in the thorax, please see same day CT chest. Other: Calcified right thyroid nodule. IMPRESSION: No acute fracture or traumatic listhesis in the cervical spine. Electronically Signed   By: Merilyn Baba M.D.   On: 06/13/2021 16:01   CT Abdomen Pelvis W Contrast  Result Date: 06/13/2021 CLINICAL DATA:  Nausea, vomiting, emesis, syncope, shortness of breath, fall EXAM: CT ABDOMEN AND PELVIS WITH CONTRAST TECHNIQUE: Multidetector CT imaging of the abdomen and pelvis was performed using the standard protocol following bolus administration of intravenous contrast. CONTRAST:  4m OMNIPAQUE IOHEXOL 350 MG/ML SOLN COMPARISON:  None. FINDINGS: Lower chest: No acute abnormality.  Coronary artery calcifications. Hepatobiliary: No solid liver abnormality is seen. Rim calcified gallstones in the gallbladder. Gallbladder wall thickening, or biliary dilatation. Pancreas: Unremarkable. No pancreatic ductal dilatation or surrounding inflammatory changes. Spleen: Normal in size without significant abnormality. Adrenals/Urinary Tract: Adrenal glands are unremarkable. Kidneys are normal, without renal calculi, solid lesion, or hydronephrosis. Bladder is unremarkable. Stomach/Bowel: Stomach is within normal limits. Appendix appears normal. No evidence of bowel wall thickening, distention, or inflammatory changes. Sigmoid diverticula. Vascular/Lymphatic: Severe aortic atherosclerosis. No enlarged abdominal or pelvic lymph nodes. Reproductive: Status post hysterectomy. Other: No abdominal wall hernia or abnormality. No abdominopelvic ascites. Musculoskeletal: No acute or significant  osseous findings. IMPRESSION: 1. No acute CT findings of the abdomen or pelvis to explain nausea, vomiting, or emesis. 2. Cholelithiasis without evidence of acute cholecystitis. 3. Sigmoid diverticulosis without evidence of acute diverticulitis. Aortic Atherosclerosis (ICD10-I70.0). Electronically Signed   By: AEddie CandleM.D.   On: 06/13/2021 15:57   ECHOCARDIOGRAM COMPLETE  Result Date: 06/14/2021    ECHOCARDIOGRAM REPORT   Patient Name:   JKOREEN MEZEYDate of Exam: 06/14/2021 Medical Rec #:  [id number]     Height:       64.0 in Accession #:    [id number]    Weight:       117.9 lb Date of Birth:  801-12-1950     BSA:          1.563 m Patient Age:    89years       BP:           122/104 mmHg Patient Gender: F              HR:           85 bpm. Exam Location:  Inpatient Procedure: 2D Echo, Cardiac Doppler and Color Doppler Indications:    R55 Syncope  History:        Patient has prior history of Echocardiogram examinations, most                 recent 04/19/2018. COPD; Signs/Symptoms:Chest Pain and Dyspnea.  Sonographer:    MOjaiReferring Phys: [id number] 1. Left ventricular ejection fraction, by estimation, is 60 to 65%. The left ventricle has normal function. The left ventricle has no regional wall motion abnormalities. Left ventricular diastolic parameters are consistent with Grade I diastolic dysfunction (impaired relaxation).  2. Right ventricular systolic function is normal. The right ventricular size is normal.  3. The mitral valve is normal in structure. No evidence of mitral valve regurgitation. No evidence of mitral stenosis.  4. The aortic valve was not well visualized. Aortic valve regurgitation is mild to moderate. Aortic regurgitation PHT measures 1055 msec. Aortic valve area,  by VTI measures 3.24 cm. Aortic valve mean gradient measures 5.0 mmHg. Aortic valve Vmax measures 1.57 m/s.  5. The inferior vena cava is normal in size with greater than 50% respiratory variability,  suggesting right atrial pressure of 3 mmHg. FINDINGS  Left Ventricle: Left ventricular ejection fraction, by estimation, is 60 to 65%. The left ventricle has normal function. The left ventricle has no regional wall motion abnormalities. The left ventricular internal cavity size was normal in size. There is  no left ventricular hypertrophy. Left ventricular diastolic parameters are consistent with Grade I diastolic dysfunction (impaired relaxation). Normal left ventricular filling pressure. Right Ventricle: The right ventricular size is normal. No increase in right ventricular wall thickness. Right ventricular systolic function is normal. Left Atrium: Left atrial size was normal in size. Right Atrium: Right atrial size was normal in size. Pericardium: There is no evidence of pericardial effusion. Mitral Valve: The mitral valve is normal in structure. No evidence of mitral valve regurgitation. No evidence of mitral valve stenosis. Tricuspid Valve: The tricuspid valve is normal in structure. Tricuspid valve regurgitation is not demonstrated. No evidence of tricuspid stenosis. Aortic Valve: The aortic valve was not well visualized. Aortic valve regurgitation is mild to moderate. Aortic regurgitation PHT measures 1055 msec. Aortic valve mean gradient measures 5.0 mmHg. Aortic valve peak gradient measures 9.9 mmHg. Aortic valve area, by VTI measures 3.24 cm. Pulmonic Valve: The pulmonic valve was normal in structure. Pulmonic valve regurgitation is not visualized. No evidence of pulmonic stenosis. Aorta: The aortic root is normal in size and structure. Venous: The inferior vena cava is normal in size with greater than 50% respiratory variability, suggesting right atrial pressure of 3 mmHg. IAS/Shunts: The interatrial septum appears to be lipomatous. No atrial level shunt detected by color flow Doppler.  LEFT VENTRICLE PLAX 2D LVIDd:         3.70 cm     Diastology LVIDs:         2.50 cm     LV e' medial:    5.55 cm/s LV  PW:         1.10 cm     LV E/e' medial:  15.4 LV IVS:        1.00 cm     LV e' lateral:   7.94 cm/s LVOT diam:     2.30 cm     LV E/e' lateral: 10.7 LV SV:         98 LV SV Index:   63 LVOT Area:     4.15 cm  LV Volumes (MOD) LV vol d, MOD A4C: 29.4 ml LV vol s, MOD A4C: 9.6 ml LV SV MOD A4C:     29.4 ml RIGHT VENTRICLE             IVC RV S prime:     10.90 cm/s  IVC diam: 1.90 cm TAPSE (M-mode): 2.7 cm LEFT ATRIUM           Index       RIGHT ATRIUM          Index LA diam:      2.80 cm 1.79 cm/m  RA Area:     7.73 cm LA Vol (A2C): 19.5 ml 12.48 ml/m RA Volume:   14.00 ml 8.96 ml/m LA Vol (A4C): 4.5 ml  2.86 ml/m  AORTIC VALVE                    PULMONIC VALVE AV Area (Vmax):  3.65 cm     PV Vmax:       0.66 m/s AV Area (Vmean):   2.86 cm     PV Peak grad:  1.7 mmHg AV Area (VTI):     3.24 cm AV Vmax:           157.00 cm/s AV Vmean:          112.000 cm/s AV VTI:            0.303 m AV Peak Grad:      9.9 mmHg AV Mean Grad:      5.0 mmHg LVOT Vmax:         138.00 cm/s LVOT Vmean:        77.000 cm/s LVOT VTI:          0.236 m LVOT/AV VTI ratio: 0.78 AI PHT:            1055 msec  AORTA Ao Root diam: 3.20 cm Ao Asc diam:  2.90 cm MITRAL VALVE                TRICUSPID VALVE MV Area (PHT): 3.67 cm     TR Peak grad:   7.6 mmHg MV E velocity: 85.20 cm/s   TR Vmax:        138.00 cm/s MV A velocity: 101.00 cm/s MV E/A ratio:  0.84         SHUNTS                             Systemic VTI:  0.24 m                             Systemic Diam: 2.30 cm Fransico Him MD Electronically signed by Fransico Him MD Signature Date/Time: 06/14/2021/9:24:22 AM    Final         Scheduled Meds:  albuterol  2 puff Inhalation Q6H   vitamin C  1,000 mg Oral BID WC   cholecalciferol  5,000 Units Oral Daily   doxycycline  100 mg Oral Q12H   enoxaparin (LOVENOX) injection  40 mg Subcutaneous Q24H   lidocaine  1 patch Transdermal Q24H   melatonin  5 mg Oral QHS   methylPREDNISolone (SOLU-MEDROL) injection  60 mg Intravenous Q12H    pantoprazole  40 mg Oral Daily   zinc sulfate  220 mg Oral Daily   Continuous Infusions:   LOS: 1 day        Phillips Climes, MD [facility]   To contact the attending provider between 7A-7P or the covering provider during after hours 7P-7A, please log into the web site [email or web address] and access using universal [place] password for that web site. If you do not have the password, please call the hospital operator.  06/14/2021, 3:22 PM

## 2021-06-14 NOTE — Progress Notes (Signed)
PHARMACY - PHYSICIAN COMMUNICATION CRITICAL VALUE ALERT - BLOOD CULTURE IDENTIFICATION (BCID)  Margaret Henry is an 70 y.o. female who presented to [facility] on 06/13/2021 with a chief complaint of fall with left sided rib pain.  Assessment:   1/4 Bottles with GPC BCID detected Staphylococcus Species  Name of physician (or Provider) Contacted: Dr. [facility]  Current antibiotics: Doxycycline 100 mg PO BID x5 days  Changes to prescribed antibiotics recommended:  1/4 bottles with Staphylococcus Species likely represents a contaminant.  No additional antibiotics indicated.   Results for orders placed or performed during the hospital encounter of 06/13/21  Blood Culture ID Panel (Reflexed) (Collected: 06/13/2021 12:41 PM)  Result Value Ref Range   Enterococcus faecalis NOT DETECTED NOT DETECTED   Enterococcus Faecium NOT DETECTED NOT DETECTED   Listeria monocytogenes NOT DETECTED NOT DETECTED   Staphylococcus species DETECTED (A) NOT DETECTED   Staphylococcus aureus (BCID) NOT DETECTED NOT DETECTED   Staphylococcus epidermidis NOT DETECTED NOT DETECTED   Staphylococcus lugdunensis NOT DETECTED NOT DETECTED   Streptococcus species NOT DETECTED NOT DETECTED   Streptococcus agalactiae NOT DETECTED NOT DETECTED   Streptococcus pneumoniae NOT DETECTED NOT DETECTED   Streptococcus pyogenes NOT DETECTED NOT DETECTED   A.calcoaceticus-baumannii NOT DETECTED NOT DETECTED   Bacteroides fragilis NOT DETECTED NOT DETECTED   Enterobacterales NOT DETECTED NOT DETECTED   Enterobacter cloacae complex NOT DETECTED NOT DETECTED   Escherichia coli NOT DETECTED NOT DETECTED   Klebsiella aerogenes NOT DETECTED NOT DETECTED   Klebsiella oxytoca NOT DETECTED NOT DETECTED   Klebsiella pneumoniae NOT DETECTED NOT DETECTED   Proteus species NOT DETECTED NOT DETECTED   Salmonella species NOT DETECTED NOT DETECTED   Serratia marcescens NOT DETECTED NOT DETECTED   Haemophilus influenzae NOT DETECTED NOT  DETECTED   Neisseria meningitidis NOT DETECTED NOT DETECTED   Pseudomonas aeruginosa NOT DETECTED NOT DETECTED   Stenotrophomonas maltophilia NOT DETECTED NOT DETECTED   Candida albicans NOT DETECTED NOT DETECTED   Candida auris NOT DETECTED NOT DETECTED   Candida glabrata NOT DETECTED NOT DETECTED   Candida krusei NOT DETECTED NOT DETECTED   Candida parapsilosis NOT DETECTED NOT DETECTED   Candida tropicalis NOT DETECTED NOT DETECTED   Cryptococcus neoformans/gattii NOT DETECTED NOT DETECTED    Lestine Box, PharmD PGY2 Infectious Diseases Pharmacy Resident   Please check [email or web address] for unit-specific pharmacy phone numbers

## 2021-06-15 LAB — COMPREHENSIVE METABOLIC PANEL
ALT: 11 U/L (ref 0–44)
AST: 21 U/L (ref 15–41)
Albumin: 3 g/dL — ABNORMAL LOW (ref 3.5–5.0)
Alkaline Phosphatase: 44 U/L (ref 38–126)
Anion gap: 6 (ref 5–15)
BUN: 18 mg/dL (ref 8–23)
CO2: 25 mmol/L (ref 22–32)
Calcium: 9.3 mg/dL (ref 8.9–10.3)
Chloride: 105 mmol/L (ref 98–111)
Creatinine, Ser: 0.6 mg/dL (ref 0.44–1.00)
GFR, Estimated: >60 mL/min (ref >60)
Glucose, Bld: 124 mg/dL — ABNORMAL HIGH (ref 70–99)
Potassium: 4.3 mmol/L (ref 3.5–5.1)
Sodium: 136 mmol/L (ref 135–145)
Total Bilirubin: 0.4 mg/dL (ref 0.3–1.2)
Total Protein: 5.7 g/dL — ABNORMAL LOW (ref 6.5–8.1)

## 2021-06-15 LAB — URINALYSIS, ROUTINE W REFLEX MICROSCOPIC
Bilirubin Urine: NEGATIVE
Glucose, UA: NEGATIVE mg/dL
Hgb urine dipstick: NEGATIVE
Ketones, ur: NEGATIVE mg/dL
Nitrite: NEGATIVE
Protein, ur: NEGATIVE mg/dL
Specific Gravity, Urine: 1.018 (ref 1.005–1.030)
WBC, UA: >50 WBC/hpf — ABNORMAL HIGH (ref 0–5)
pH: 6 (ref 5.0–8.0)

## 2021-06-15 LAB — CBC WITH DIFFERENTIAL/PLATELET
Abs Immature Granulocytes: 0.02 10*3/uL (ref 0.00–0.07)
Basophils Absolute: 0 10*3/uL (ref 0.0–0.1)
Basophils Relative: 0 %
Eosinophils Absolute: 0 10*3/uL (ref 0.0–0.5)
Eosinophils Relative: 0 %
HCT: 27.9 % — ABNORMAL LOW (ref 36.0–46.0)
Hemoglobin: 8.7 g/dL — ABNORMAL LOW (ref 12.0–15.0)
Immature Granulocytes: 0 %
Lymphocytes Relative: 8 %
Lymphs Abs: 0.6 10*3/uL — ABNORMAL LOW (ref 0.7–4.0)
MCH: 25.4 pg — ABNORMAL LOW (ref 26.0–34.0)
MCHC: 31.2 g/dL (ref 30.0–36.0)
MCV: 81.3 fL (ref 80.0–100.0)
Monocytes Absolute: 0.7 10*3/uL (ref 0.1–1.0)
Monocytes Relative: 9 %
Neutro Abs: 5.8 10*3/uL (ref 1.7–7.7)
Neutrophils Relative %: 83 %
Platelets: 263 10*3/uL (ref 150–400)
RBC: 3.43 MIL/uL — ABNORMAL LOW (ref 3.87–5.11)
RDW: 19.7 % — ABNORMAL HIGH (ref 11.5–15.5)
WBC: 7 10*3/uL (ref 4.0–10.5)
nRBC: 0 % (ref 0.0–0.2)

## 2021-06-15 LAB — MAGNESIUM: Magnesium: 2 mg/dL (ref 1.7–2.4)

## 2021-06-15 LAB — D-DIMER, QUANTITATIVE: D-Dimer, Quant: 0.85 ug/mL-FEU — ABNORMAL HIGH (ref 0.00–0.50)

## 2021-06-15 LAB — PHOSPHORUS: Phosphorus: 3.5 mg/dL (ref 2.5–4.6)

## 2021-06-15 LAB — FERRITIN: Ferritin: 23 ng/mL (ref 11–307)

## 2021-06-15 LAB — C-REACTIVE PROTEIN: CRP: 0.5 mg/dL (ref <1.0)

## 2021-06-15 NOTE — Progress Notes (Signed)
PROGRESS NOTE    Margaret Henry  [id number] DOB: January 03, 1951 DOA: 06/13/2021 PCP: [name], MD    Chief Complaint  Patient presents with   Fall    Brief Narrative:   Margaret Henry is a 70 y.o. female with medical history significant of chronic COPD  with chronic respiratory failure on 2L oxygen prn, hyperlipidemia, GERD, coronary arterial sclerosis who presented for syncope and collapse.  She states she has been sick since Saturday. She states she had a sore throat, was achy and had some chills.  She is on 2 L nasal cannula at baseline, her work-up was significant for COVID-19 infection, still exacerbation, with worsening oxygen requirement, she was admitted for further work-up.  Assessment & Plan:   Principal Problem:   Syncope and collapse Active Problems:   Hyperlipidemia   COPD exacerbation (HCC)   COVID-19 virus infection   Acute on chronic respiratory failure with hypoxia (HCC)   Syncope and collapse -70 year old female presenting with syncope and collapse found to have positive COVID-19 infection as well as possible COPD exacerbation and acute on chronic respiratory failure. -CTA negative for PE -2D echo with a preserved EF, grade 1 diastolic dysfunction, and mild to moderate aortic regurg -Continue to monitor on telemetry  Acute on chronic hypoxic respiratory failure -At baseline on 2 L nasal cannula, increased oxygen requirement up to 5 L yesterday, this is currently improving with IV steroids. -this is most likely in the setting of COPDexacerbation more than COVID-19   COPD exacerbation (HCC) -Known COPD Gold D with chronic respiratory failure on 2 L of oxygen at home.  Has severe obstructive lung disease, severe diffusion impairment. FEV1: 27% -Clinical history with decreased sputum,  no worsening cough, not necessarily consistent with exacerbation; does have increased oxygen need as well as COVID-19  infection which could exacerbate her COPD.  Responded  well to DuoNeb in ER. -Continue with doxycycline. -Continue with IV Solu-Medrol. -SABA prn  Continue her Spiriva and Breo.  She has been tried on Trelegy inhaler in 2019 but had to go back to [place] since it made her breathing worse.  Also tried on Daliresp but had side effects. -She remains with significantly diminished air entry today, will keep on current dose IV steroids with no tapering.  1/4 blood cultures positive for gram-positive cocci -Most likely contamination, will continue to monitor on Doxycycline  with no broadening of antibiotics pending final results.     COVID-19 virus infection -covid labs pending. CTA negative for PE and CXR with no acute process  -unsure if acute on chronic respiratory failure is more from her COPD versus COVID or a combination of both or possible underlying cardiac condition with syncope -continue IV steroids BID -nutraceutical bundle with zinc, vitamin D, vitamin C and melatonin -Patient is declining remdesivir. -She was encouraged to use incentive spirometer and flutter valve,      Hyperlipidemia/atherosclerosis of aorta -Recent lipid panel 05/27/2021 showed LDL of 70 HDL 58. -Continue Lipitor and antiplatelet therapy   Hx of anemia As not been taking her oral iron  Iron studies pending     DVT prophylaxis: Lovenox Code Status: DNR Family Communication: None at bedside Disposition:   Status is: Inpatient  Remains inpatient appropriate because:IV treatments appropriate due to intensity of illness or inability to take PO  Dispo: The patient is from: Home              Anticipated d/c is to: Home  Patient currently is not medically stable to d/c.   Difficult to place patient No       Consultants:  None   Subjective:  Reports her dyspnea has improved, she is complaining of chest pain related to cough. Objective: Vitals:   06/14/21 1957 06/15/21 0400 06/15/21 0800 06/15/21 1217  BP: (!) 89/75 123/61 130/71  113/60  Pulse: 98 67 90 94  Resp: '18 16 18 17  '$ Temp: 98.5 F (36.9 C) 97.6 F (36.4 C) 97.9 F (36.6 C) 97.8 F (36.6 C)  TempSrc: Oral Oral Oral Oral  SpO2: 96% 100% 96% 93%  Weight:      Height:       No intake or output data in the 24 hours ending 06/15/21 1329  Filed Weights   06/13/21 1218 06/13/21 2147  Weight: 54.4 kg 53.5 kg    Examination:  Awake Alert, Oriented X 3, No new F.N deficits, frail. Symmetrical Chest wall movement, improved air entry today, wheezing has subsided. RRR,No Gallops,Rubs or new Murmurs, No Parasternal Heave +ve B.Sounds, Abd Soft, No tenderness, No rebound - guarding or rigidity. No Cyanosis, Clubbing or edema, No new Rash or bruise      Data Reviewed: I have personally reviewed following labs and imaging studies  CBC: Recent Labs  Lab 06/13/21 1240 06/14/21 0058 06/15/21 0220  WBC 5.9 5.2 7.0  NEUTROABS 4.2 4.4 5.8  HGB 10.0* 9.6* 8.7*  HCT 33.3* 30.6* 27.9*  MCV 83.0 80.7 81.3  PLT 281 310 [id number]    Basic Metabolic Panel: Recent Labs  Lab 06/13/21 1240 06/14/21 0058 06/15/21 0220  NA 135 136 136  K 3.7 3.9 4.3  CL 106 107 105  CO2 '23 22 25  '$ GLUCOSE 119* 150* 124*  BUN '16 15 18  '$ CREATININE 0.71 0.71 0.60  CALCIUM 8.2* 9.1 9.3  MG  --  2.2 2.0  PHOS  --  4.3 3.5    GFR: Estimated Creatinine Clearance: 54.5 mL/min (by C-G formula based on SCr of 0.6 mg/dL).  Liver Function Tests: Recent Labs  Lab 06/13/21 1240 06/14/21 0058 06/15/21 0220  AST '22 22 21  '$ ALT '13 11 11  '$ ALKPHOS 56 56 44  BILITOT 0.6 0.4 0.4  PROT 6.0* 6.4* 5.7*  ALBUMIN 3.2* 3.3* 3.0*    CBG: No results for input(s): GLUCAP in the last 168 hours.   Recent Results (from the past 240 hour(s))  Resp Panel by RT-PCR (Flu A&B, Covid) Nasopharyngeal Swab     Status: Abnormal   Collection Time: 06/13/21 12:40 PM   Specimen: Nasopharyngeal Swab; Nasopharyngeal(NP) swabs in vial transport medium  Result Value Ref Range Status   SARS Coronavirus  2 by RT PCR POSITIVE (A) NEGATIVE Final    Comment: RESULT CALLED TO, READ BACK BY AND VERIFIED WITH: RN C.CHAPMAN AT [id number] ON 06/13/2021 BY T.SAAD. (NOTE) SARS-CoV-2 target nucleic acids are DETECTED.  The SARS-CoV-2 RNA is generally detectable in upper respiratory specimens during the acute phase of infection. Positive results are indicative of the presence of the identified virus, but do not rule out bacterial infection or co-infection with other pathogens not detected by the test. Clinical correlation with patient history and other diagnostic information is necessary to determine patient infection status. The expected result is Negative.  Fact Sheet for Patients: EntrepreneurPulse.com.au  Fact Sheet for Healthcare Providers: IncredibleEmployment.be  This test is not yet approved or cleared by the [place] FDA and  has been authorized for detection and/or diagnosis of SARS-CoV-2 by  FDA under an Emergency Use Authorization (EUA).  This EUA will remain in effect (meaning this  test can be used) for the duration of  the COVID-19 declaration under Section 564(b)(1) of the Act, 21 U.S.C. section 360bbb-3(b)(1), unless the authorization is terminated or revoked sooner.     Influenza A by PCR NEGATIVE NEGATIVE Final   Influenza B by PCR NEGATIVE NEGATIVE Final    Comment: (NOTE) The Xpert Xpress SARS-CoV-2/FLU/RSV plus assay is intended as an aid in the diagnosis of influenza from Nasopharyngeal swab specimens and should not be used as a sole basis for treatment. Nasal washings and aspirates are unacceptable for Xpert Xpress SARS-CoV-2/FLU/RSV testing.  Fact Sheet for Patients: EntrepreneurPulse.com.au  Fact Sheet for Healthcare Providers: IncredibleEmployment.be  This test is not yet approved or cleared by the [place] FDA and has been authorized for detection and/or diagnosis of SARS-CoV-2  by FDA under an Emergency Use Authorization (EUA). This EUA will remain in effect (meaning this test can be used) for the duration of the COVID-19 declaration under Section 564(b)(1) of the Act, 21 U.S.C. section 360bbb-3(b)(1), unless the authorization is terminated or revoked.  Performed at [facility], [place] [address], [place], [place] [address]   Blood culture (routine x 2)     Status: None (Preliminary result)   Collection Time: 06/13/21 12:41 PM   Specimen: BLOOD  Result Value Ref Range Status   Specimen Description BLOOD RIGHT ANTECUBITAL  Final   Special Requests   Final    BOTTLES DRAWN AEROBIC AND ANAEROBIC Blood Culture results may not be optimal due to an inadequate volume of blood received in culture bottles   Culture   Final    NO GROWTH 2 DAYS Performed at [facility], [place] [address] [place], [place] [address]    Report Status PENDING  Incomplete  Blood culture (routine x 2)     Status: Abnormal (Preliminary result)   Collection Time: 06/13/21 12:41 PM   Specimen: BLOOD LEFT FOREARM  Result Value Ref Range Status   Specimen Description BLOOD LEFT FOREARM  Final   Special Requests   Final    BOTTLES DRAWN AEROBIC ONLY Blood Culture adequate volume   Culture  Setup Time   Final    GRAM POSITIVE COCCI AEROBIC BOTTLE ONLY CRITICAL RESULT CALLED TO, READ BACK BY AND VERIFIED WITH: PHARMD A LAWLESS [id number] AT 67 AM B Y CM    Culture (A)  Final    STAPHYLOCOCCUS HOMINIS THE SIGNIFICANCE OF ISOLATING THIS ORGANISM FROM A SINGLE SET OF BLOOD CULTURES WHEN MULTIPLE SETS ARE DRAWN IS UNCERTAIN. PLEASE NOTIFY THE MICROBIOLOGY DEPARTMENT WITHIN ONE WEEK IF SPECIATION AND SENSITIVITIES ARE REQUIRED. Performed at [facility], [place] [address], [place], [place] [address]    Report Status PENDING  Incomplete  Blood Culture ID Panel (Reflexed)     Status: Abnormal   Collection Time: 06/13/21 12:41 PM  Result Value Ref Range Status   Enterococcus faecalis NOT  DETECTED NOT DETECTED Final   Enterococcus Faecium NOT DETECTED NOT DETECTED Final   Listeria monocytogenes NOT DETECTED NOT DETECTED Final   Staphylococcus species DETECTED (A) NOT DETECTED Final    Comment: CRITICAL RESULT CALLED TO, READ BACK BY AND VERIFIED WITH: PHARMD A LAWLESS [id number] AT 855 AM BY CM    Staphylococcus aureus (BCID) NOT DETECTED NOT DETECTED Final   Staphylococcus epidermidis NOT DETECTED NOT DETECTED Final   Staphylococcus lugdunensis NOT DETECTED NOT DETECTED Final   Streptococcus  species NOT DETECTED NOT DETECTED Final   Streptococcus agalactiae NOT DETECTED NOT DETECTED Final   Streptococcus pneumoniae NOT DETECTED NOT DETECTED Final   Streptococcus pyogenes NOT DETECTED NOT DETECTED Final   A.calcoaceticus-baumannii NOT DETECTED NOT DETECTED Final   Bacteroides fragilis NOT DETECTED NOT DETECTED Final   Enterobacterales NOT DETECTED NOT DETECTED Final   Enterobacter cloacae complex NOT DETECTED NOT DETECTED Final   Escherichia coli NOT DETECTED NOT DETECTED Final   Klebsiella aerogenes NOT DETECTED NOT DETECTED Final   Klebsiella oxytoca NOT DETECTED NOT DETECTED Final   Klebsiella pneumoniae NOT DETECTED NOT DETECTED Final   Proteus species NOT DETECTED NOT DETECTED Final   Salmonella species NOT DETECTED NOT DETECTED Final   Serratia marcescens NOT DETECTED NOT DETECTED Final   Haemophilus influenzae NOT DETECTED NOT DETECTED Final   Neisseria meningitidis NOT DETECTED NOT DETECTED Final   Pseudomonas aeruginosa NOT DETECTED NOT DETECTED Final   Stenotrophomonas maltophilia NOT DETECTED NOT DETECTED Final   Candida albicans NOT DETECTED NOT DETECTED Final   Candida auris NOT DETECTED NOT DETECTED Final   Candida glabrata NOT DETECTED NOT DETECTED Final   Candida krusei NOT DETECTED NOT DETECTED Final   Candida parapsilosis NOT DETECTED NOT DETECTED Final   Candida tropicalis NOT DETECTED NOT DETECTED Final   Cryptococcus neoformans/gattii NOT DETECTED  NOT DETECTED Final    Comment: Performed at [facility], [place] [address] [place], [place] [address]  Group A Strep by PCR     Status: None   Collection Time: 06/13/21 12:56 PM   Specimen: Throat; Sterile Swab  Result Value Ref Range Status   Group A Strep by PCR NOT DETECTED NOT DETECTED Final    Comment: Performed at [facility], [place] [address], [place], [place] [address]         Radiology Studies: DG Chest 2 View  Result Date: 06/13/2021 CLINICAL DATA:  Cough, chest pain, fall EXAM: CHEST - 2 VIEW COMPARISON:  Chest radiograph 04/12/2021 FINDINGS: The cardiomediastinal silhouette is stable with unchanged atherosclerotic plaque of the aortic arch. The lungs are hyperlucent consistent with emphysema. Streaky opacities in the lower lobe seen on the lateral projection are similar to the prior study. There is no new focal airspace disease. There is no pleural effusion or pneumothorax. There is no acute osseous abnormality. IMPRESSION: COPD. Otherwise, no radiographic evidence of acute cardiopulmonary process. Electronically Signed   By: Valetta Mole M.D.   On: 06/13/2021 14:14   CT HEAD WO CONTRAST (5MM)  Result Date: 06/13/2021 CLINICAL DATA:  Head trauma, moderate/severe. EXAM: CT HEAD WITHOUT CONTRAST TECHNIQUE: Contiguous axial images were obtained from the base of the skull through the vertex without intravenous contrast. COMPARISON:  MRI head 03/28/2012. FINDINGS: Brain: No evidence of acute infarction, hemorrhage, hydrocephalus, extra-axial collection or mass lesion/mass effect. Vascular: There are minimal atherosclerotic calcifications of the intracranial internal carotid arteries. Skull: Normal. Negative for fracture or focal lesion. Sinuses/Orbits: Air-fluid levels are seen in posterior right ethmoid air cells and within the right sphenoid sinus. The mastoid air cells are clear. Other: None. IMPRESSION: 1. No acute intracranial abnormality. 2. Right ethmoid and right sphenoid  sinusitis. Electronically Signed   By: Ronney Asters M.D.   On: 06/13/2021 15:54   CT Angio Chest PE W and/or Wo Contrast  Result Date: 06/13/2021 CLINICAL DATA:  Shortness of breath, syncope, fall, chest pain EXAM: CT ANGIOGRAPHY CHEST WITH CONTRAST TECHNIQUE: Multidetector CT imaging of the chest was performed using the standard  protocol during bolus administration of intravenous contrast. Multiplanar CT image reconstructions and MIPs were obtained to evaluate the vascular anatomy. CONTRAST:  22m OMNIPAQUE IOHEXOL 350 MG/ML SOLN COMPARISON:  04/25/2021 FINDINGS: Cardiovascular: Pulmonary arteries are normal in caliber and patent. Negative for significant acute pulmonary embolus by CTA. Extensive calcific atherosclerosis of the aorta. No significant aneurysm. No acute dissection. Patent three-vessel arch anatomy. No mediastinal hemorrhage or hematoma. Heart size normal. Native coronary atherosclerosis. No pericardial effusion. Central venous structures are patent.  No veno-occlusive process. Mediastinum/Nodes: Stable dystrophic right thyroid calcification. Trachea and central airways are patent. Esophagus nondilated. No adenopathy. Lungs/Pleura: Severe diffuse emphysema pattern and hyperinflation. No acute airspace process, collapse consolidation. Minor basilar scarring. No superimposed edema pattern or CHF. No pleural abnormality, effusion, or pneumothorax. Upper Abdomen: Calcified gallstones noted. Extensive abdominal atherosclerosis. Punctate hepatic and splenic granulomata. Musculoskeletal: No acute osseous finding. Degenerative changes of the spine. No compression fracture. Motion artifact across the sternum. Review of the MIP images confirms the above findings. IMPRESSION: Negative for significant acute pulmonary embolus by CTA. No other acute intrathoracic finding. Native coronary atherosclerosis Cholelithiasis Aortic Atherosclerosis (ICD10-I70.0) and Emphysema (ICD10-J43.9). Electronically Signed   By:  MJerilynn Mages  Shick M.D.   On: 06/13/2021 15:48   CT Cervical Spine Wo Contrast  Result Date: 06/13/2021 CLINICAL DATA:  Neck trauma EXAM: CT CERVICAL SPINE WITHOUT CONTRAST TECHNIQUE: Multidetector CT imaging of the cervical spine was performed without intravenous contrast. Multiplanar CT image reconstructions were also generated. COMPARISON:  None. FINDINGS: Alignment: Straightening and mild reversal of the normal cervical lordosis, centered on C5-C6, which may be positional. Skull base and vertebrae: No acute fracture. No primary bone lesion or focal pathologic process. Soft tissues and spinal canal: No prevertebral fluid or swelling. No visible canal hematoma. Disc levels: Mild degenerative changes, without high-grade spinal canal stenosis or neural foraminal narrowing. Upper chest: For findings in the thorax, please see same day CT chest. Other: Calcified right thyroid nodule. IMPRESSION: No acute fracture or traumatic listhesis in the cervical spine. Electronically Signed   By: AMerilyn BabaM.D.   On: 06/13/2021 16:01   CT Abdomen Pelvis W Contrast  Result Date: 06/13/2021 CLINICAL DATA:  Nausea, vomiting, emesis, syncope, shortness of breath, fall EXAM: CT ABDOMEN AND PELVIS WITH CONTRAST TECHNIQUE: Multidetector CT imaging of the abdomen and pelvis was performed using the standard protocol following bolus administration of intravenous contrast. CONTRAST:  716mOMNIPAQUE IOHEXOL 350 MG/ML SOLN COMPARISON:  None. FINDINGS: Lower chest: No acute abnormality.  Coronary artery calcifications. Hepatobiliary: No solid liver abnormality is seen. Rim calcified gallstones in the gallbladder. Gallbladder wall thickening, or biliary dilatation. Pancreas: Unremarkable. No pancreatic ductal dilatation or surrounding inflammatory changes. Spleen: Normal in size without significant abnormality. Adrenals/Urinary Tract: Adrenal glands are unremarkable. Kidneys are normal, without renal calculi, solid lesion, or hydronephrosis.  Bladder is unremarkable. Stomach/Bowel: Stomach is within normal limits. Appendix appears normal. No evidence of bowel wall thickening, distention, or inflammatory changes. Sigmoid diverticula. Vascular/Lymphatic: Severe aortic atherosclerosis. No enlarged abdominal or pelvic lymph nodes. Reproductive: Status post hysterectomy. Other: No abdominal wall hernia or abnormality. No abdominopelvic ascites. Musculoskeletal: No acute or significant osseous findings. IMPRESSION: 1. No acute CT findings of the abdomen or pelvis to explain nausea, vomiting, or emesis. 2. Cholelithiasis without evidence of acute cholecystitis. 3. Sigmoid diverticulosis without evidence of acute diverticulitis. Aortic Atherosclerosis (ICD10-I70.0). Electronically Signed   By: AlEddie Candle.D.   On: 06/13/2021 15:57   ECHOCARDIOGRAM COMPLETE  Result Date: 06/14/2021  ECHOCARDIOGRAM REPORT   Patient Name:   TOREY CHESTNUT Date of Exam: 06/14/2021 Medical Rec #:  [id number]      Height:       64.0 in Accession #:    [id number]     Weight:       117.9 lb Date of Birth:  1951-08-18      BSA:          1.563 m Patient Age:    32 years       BP:           122/104 mmHg Patient Gender: F              HR:           85 bpm. Exam Location:  Inpatient Procedure: 2D Echo, Cardiac Doppler and Color Doppler Indications:    R55 Syncope  History:        Patient has prior history of Echocardiogram examinations, most                 recent 04/19/2018. COPD; Signs/Symptoms:Chest Pain and Dyspnea.  Sonographer:    [place] Referring Phys: [id number] [place]  1. Left ventricular ejection fraction, by estimation, is 60 to 65%. The left ventricle has normal function. The left ventricle has no regional wall motion abnormalities. Left ventricular diastolic parameters are consistent with Grade I diastolic dysfunction (impaired relaxation).  2. Right ventricular systolic function is normal. The right ventricular size is normal.  3. The mitral valve is normal in  structure. No evidence of mitral valve regurgitation. No evidence of mitral stenosis.  4. The aortic valve was not well visualized. Aortic valve regurgitation is mild to moderate. Aortic regurgitation PHT measures 1055 msec. Aortic valve area, by VTI measures 3.24 cm. Aortic valve mean gradient measures 5.0 mmHg. Aortic valve Vmax measures 1.57 m/s.  5. The inferior vena cava is normal in size with greater than 50% respiratory variability, suggesting right atrial pressure of 3 mmHg. FINDINGS  Left Ventricle: Left ventricular ejection fraction, by estimation, is 60 to 65%. The left ventricle has normal function. The left ventricle has no regional wall motion abnormalities. The left ventricular internal cavity size was normal in size. There is  no left ventricular hypertrophy. Left ventricular diastolic parameters are consistent with Grade I diastolic dysfunction (impaired relaxation). Normal left ventricular filling pressure. Right Ventricle: The right ventricular size is normal. No increase in right ventricular wall thickness. Right ventricular systolic function is normal. Left Atrium: Left atrial size was normal in size. Right Atrium: Right atrial size was normal in size. Pericardium: There is no evidence of pericardial effusion. Mitral Valve: The mitral valve is normal in structure. No evidence of mitral valve regurgitation. No evidence of mitral valve stenosis. Tricuspid Valve: The tricuspid valve is normal in structure. Tricuspid valve regurgitation is not demonstrated. No evidence of tricuspid stenosis. Aortic Valve: The aortic valve was not well visualized. Aortic valve regurgitation is mild to moderate. Aortic regurgitation PHT measures 1055 msec. Aortic valve mean gradient measures 5.0 mmHg. Aortic valve peak gradient measures 9.9 mmHg. Aortic valve area, by VTI measures 3.24 cm. Pulmonic Valve: The pulmonic valve was normal in structure. Pulmonic valve regurgitation is not visualized. No evidence of  pulmonic stenosis. Aorta: The aortic root is normal in size and structure. Venous: The inferior vena cava is normal in size with greater than 50% respiratory variability, suggesting right atrial pressure of 3 mmHg. IAS/Shunts: The interatrial septum appears to be lipomatous. No atrial  level shunt detected by color flow Doppler.  LEFT VENTRICLE PLAX 2D LVIDd:         3.70 cm     Diastology LVIDs:         2.50 cm     LV e' medial:    5.55 cm/s LV PW:         1.10 cm     LV E/e' medial:  15.4 LV IVS:        1.00 cm     LV e' lateral:   7.94 cm/s LVOT diam:     2.30 cm     LV E/e' lateral: 10.7 LV SV:         98 LV SV Index:   63 LVOT Area:     4.15 cm  LV Volumes (MOD) LV vol d, MOD A4C: 29.4 ml LV vol s, MOD A4C: 9.6 ml LV SV MOD A4C:     29.4 ml RIGHT VENTRICLE             IVC RV S prime:     10.90 cm/s  IVC diam: 1.90 cm TAPSE (M-mode): 2.7 cm LEFT ATRIUM           Index       RIGHT ATRIUM          Index LA diam:      2.80 cm 1.79 cm/m  RA Area:     7.73 cm LA Vol (A2C): 19.5 ml 12.48 ml/m RA Volume:   14.00 ml 8.96 ml/m LA Vol (A4C): 4.5 ml  2.86 ml/m  AORTIC VALVE                    PULMONIC VALVE AV Area (Vmax):    3.65 cm     PV Vmax:       0.66 m/s AV Area (Vmean):   2.86 cm     PV Peak grad:  1.7 mmHg AV Area (VTI):     3.24 cm AV Vmax:           157.00 cm/s AV Vmean:          112.000 cm/s AV VTI:            0.303 m AV Peak Grad:      9.9 mmHg AV Mean Grad:      5.0 mmHg LVOT Vmax:         138.00 cm/s LVOT Vmean:        77.000 cm/s LVOT VTI:          0.236 m LVOT/AV VTI ratio: 0.78 AI PHT:            1055 msec  AORTA Ao Root diam: 3.20 cm Ao Asc diam:  2.90 cm MITRAL VALVE                TRICUSPID VALVE MV Area (PHT): 3.67 cm     TR Peak grad:   7.6 mmHg MV E velocity: 85.20 cm/s   TR Vmax:        138.00 cm/s MV A velocity: 101.00 cm/s MV E/A ratio:  0.84         SHUNTS                             Systemic VTI:  0.24 m  Systemic Diam: 2.30 cm Fransico Him MD Electronically  signed by Fransico Him MD Signature Date/Time: 06/14/2021/9:24:22 AM    Final         Scheduled Meds:  vitamin C  1,000 mg Oral BID WC   cholecalciferol  5,000 Units Oral Daily   doxycycline  100 mg Oral Q12H   enoxaparin (LOVENOX) injection  40 mg Subcutaneous Q24H   lidocaine  1 patch Transdermal Q24H   melatonin  5 mg Oral QHS   methylPREDNISolone (SOLU-MEDROL) injection  60 mg Intravenous Q12H   pantoprazole  40 mg Oral Daily   zinc sulfate  220 mg Oral Daily   Continuous Infusions:   LOS: 2 days        Phillips Climes, MD [facility]   To contact the attending provider between 7A-7P or the covering provider during after hours 7P-7A, please log into the web site [email or web address] and access using universal [place] password for that web site. If you do not have the password, please call the hospital operator.  06/15/2021, 1:29 PM

## 2021-06-15 NOTE — Plan of Care (Signed)
  Problem: Clinical Measurements: Goal: Ability to maintain clinical measurements within normal limits will improve Outcome: Progressing   Problem: Activity: Goal: Risk for activity intolerance will decrease Outcome: Progressing   Problem: Nutrition: Goal: Adequate nutrition will be maintained Outcome: Progressing   Problem: Education: Goal: Knowledge of General Education information will improve Description: Including pain rating scale, medication(s)/side effects and non-pharmacologic comfort measures Outcome: Progressing   Problem: Clinical Measurements: Goal: Will remain free from infection Outcome: Progressing

## 2021-06-15 NOTE — Evaluation (Signed)
Physical Therapy Evaluation Patient Details Name: Margaret Henry MRN: [id number] DOB: 1951/02/25 Today's Date: 06/15/2021   History of Present Illness  70 y.o. female presented 06/13/21 for syncope and collapse. + COVID  PMH significant of chronic COPD  with chronic respiratory failure on 2L oxygen prn, hyperlipidemia, GERD, coronary arterial sclerosis  Clinical Impression   Pt admitted secondary to problem above with deficits below. PTA patient was living alone, independent with her mobility except for up/down stairs into home (son assists), and assist with IADLs.  Pt currently requires min assist for short distance ambulation on 3L O2 with sats decr to 83%. Required ~4 minutes to recover to 92%.  Anticipate patient will benefit from PT to address problems listed below.Will continue to follow acutely to maximize functional mobility independence and safety.       Follow Up Recommendations No PT follow up;Supervision - Intermittent    Equipment Recommendations  Rolling walker with 5" wheels    Recommendations for Other Services       Precautions / Restrictions Precautions Precautions: Fall;Other (comment) Precaution Comments: desaturates on room air      Mobility  Bed Mobility Overal bed mobility: Independent             General bed mobility comments: pt sitting on side of bed on arrival    Transfers Overall transfer level: Needs assistance Equipment used: 1 person hand held assist Transfers: Sit to/from Stand;Stand Pivot Transfers Sit to Stand: Min guard Stand pivot transfers: Min assist       General transfer comment: slightly unsteady as stepping around to [facility]  Ambulation/Gait Ambulation/Gait assistance: Min assist Gait Distance (Feet): 15 Feet Assistive device: 1 person hand held assist Gait Pattern/deviations: Step-through pattern;Decreased stride length     General Gait Details: limited by O2 tubing, dyspnea, and desaturation  Stairs             Wheelchair Mobility    Modified Rankin (Stroke Patients Only)       Balance                                             Pertinent Vitals/Pain Pain Assessment: No/denies pain    Home Living Family/patient expects to be discharged to:: Private residence Living Arrangements: Alone Available Help at Discharge: Family;Available PRN/intermittently (son assists when in town (truck driver); nephew near by) Type of Home: House Home Access: Stairs to enter Entrance Stairs-Rails: Right;Left;Can reach both Technical brewer of Steps: 3 Home Layout: Two level;Able to live on main level with bedroom/bathroom Home Equipment: Shower seat;Hand held shower head      Prior Function Level of Independence: Needs assistance   Gait / Transfers Assistance Needed: independent with ambulation; son always with her if she goes out up/down her steps  ADL's / Homemaking Assistance Needed: son does her grocery shopping; drives her to apppointments        Hand Dominance        Extremity/Trunk Assessment   Upper Extremity Assessment Upper Extremity Assessment: Defer to OT evaluation    Lower Extremity Assessment Lower Extremity Assessment: Generalized weakness    Cervical / Trunk Assessment Cervical / Trunk Assessment: Normal  Communication   Communication: No difficulties  Cognition Arousal/Alertness: Awake/alert Behavior During Therapy: Flat affect Overall Cognitive Status: Within Functional Limits for tasks assessed  General Comments General comments (skin integrity, edema, etc.): HR 102-122 bpm; sats on 3L 96% at rest; 83% at rest on RA; 81% on 3L with ambulation    Exercises Other Exercises Other Exercises: reviewed use of IS and flutter valve and appropriate frequency for use   Assessment/Plan    PT Assessment Patient needs continued PT services  PT Problem List Decreased strength;Decreased  activity tolerance;Decreased balance;Decreased mobility;Cardiopulmonary status limiting activity       PT Treatment Interventions DME instruction;Gait training;Stair training;Functional mobility training;Therapeutic activities;Therapeutic exercise;Balance training;Patient/family education    PT Goals (Current goals can be found in the Care Plan section)  Acute Rehab PT Goals Patient Stated Goal: go home without needing a RW PT Goal Formulation: With patient Time For Goal Achievement: 06/29/21 Potential to Achieve Goals: Good    Frequency Min 3X/week   Barriers to discharge Decreased caregiver support      Co-evaluation               AM-PAC PT "6 Clicks" Mobility  Outcome Measure Help needed turning from your back to your side while in a flat bed without using bedrails?: None Help needed moving from lying on your back to sitting on the side of a flat bed without using bedrails?: None Help needed moving to and from a bed to a chair (including a wheelchair)?: A Little Help needed standing up from a chair using your arms (e.g., wheelchair or bedside chair)?: A Little Help needed to walk in hospital room?: A Little Help needed climbing 3-5 steps with a railing? : A Little 6 Click Score: 20    End of Session Equipment Utilized During Treatment: Oxygen Activity Tolerance: Treatment limited secondary to medical complications (Comment) (desaturation) Patient left: in chair;with call bell/phone within reach   PT Visit Diagnosis: Unsteadiness on feet (R26.81)    Time: [id number] PT Time Calculation (min) (ACUTE ONLY): 23 min   Charges:   PT Evaluation $PT Eval Low Complexity: 1 Low           Arby Barrette, PT Pager [phone]   Rexanne Mano 06/15/2021, 12:04 PM

## 2021-06-16 LAB — CBC WITH DIFFERENTIAL/PLATELET
Abs Immature Granulocytes: 0.05 10*3/uL (ref 0.00–0.07)
Basophils Absolute: 0 10*3/uL (ref 0.0–0.1)
Basophils Relative: 0 %
Eosinophils Absolute: 0 10*3/uL (ref 0.0–0.5)
Eosinophils Relative: 0 %
HCT: 27.9 % — ABNORMAL LOW (ref 36.0–46.0)
Hemoglobin: 8.7 g/dL — ABNORMAL LOW (ref 12.0–15.0)
Immature Granulocytes: 1 %
Lymphocytes Relative: 8 %
Lymphs Abs: 0.6 10*3/uL — ABNORMAL LOW (ref 0.7–4.0)
MCH: 25.1 pg — ABNORMAL LOW (ref 26.0–34.0)
MCHC: 31.2 g/dL (ref 30.0–36.0)
MCV: 80.6 fL (ref 80.0–100.0)
Monocytes Absolute: 0.5 10*3/uL (ref 0.1–1.0)
Monocytes Relative: 7 %
Neutro Abs: 5.9 10*3/uL (ref 1.7–7.7)
Neutrophils Relative %: 84 %
Platelets: 247 10*3/uL (ref 150–400)
RBC: 3.46 MIL/uL — ABNORMAL LOW (ref 3.87–5.11)
RDW: 19.7 % — ABNORMAL HIGH (ref 11.5–15.5)
WBC: 7 10*3/uL (ref 4.0–10.5)
nRBC: 0 % (ref 0.0–0.2)

## 2021-06-16 LAB — COMPREHENSIVE METABOLIC PANEL
ALT: 13 U/L (ref 0–44)
AST: 19 U/L (ref 15–41)
Albumin: 2.9 g/dL — ABNORMAL LOW (ref 3.5–5.0)
Alkaline Phosphatase: 43 U/L (ref 38–126)
Anion gap: 8 (ref 5–15)
BUN: 19 mg/dL (ref 8–23)
CO2: 26 mmol/L (ref 22–32)
Calcium: 9.4 mg/dL (ref 8.9–10.3)
Chloride: 104 mmol/L (ref 98–111)
Creatinine, Ser: 0.54 mg/dL (ref 0.44–1.00)
GFR, Estimated: >60 mL/min (ref >60)
Glucose, Bld: 125 mg/dL — ABNORMAL HIGH (ref 70–99)
Potassium: 4.4 mmol/L (ref 3.5–5.1)
Sodium: 138 mmol/L (ref 135–145)
Total Bilirubin: 0.3 mg/dL (ref 0.3–1.2)
Total Protein: 5.5 g/dL — ABNORMAL LOW (ref 6.5–8.1)

## 2021-06-16 LAB — CULTURE, BLOOD (ROUTINE X 2): Special Requests: ADEQUATE

## 2021-06-16 LAB — PHOSPHORUS: Phosphorus: 3.3 mg/dL (ref 2.5–4.6)

## 2021-06-16 LAB — D-DIMER, QUANTITATIVE: D-Dimer, Quant: 0.63 ug/mL-FEU — ABNORMAL HIGH (ref 0.00–0.50)

## 2021-06-16 LAB — MAGNESIUM: Magnesium: 2.2 mg/dL (ref 1.7–2.4)

## 2021-06-16 LAB — FERRITIN: Ferritin: 25 ng/mL (ref 11–307)

## 2021-06-16 LAB — C-REACTIVE PROTEIN: CRP: <0.5 mg/dL (ref <1.0)

## 2021-06-16 MED ORDER — GLYCERIN (LAXATIVE) 2 G RE SUPP
1.0000 | Freq: Once | RECTAL | Status: AC
  Administered 2021-06-16: 1 via RECTAL
  Filled 2021-06-16: qty 1

## 2021-06-16 NOTE — Progress Notes (Signed)
Physical Therapy Treatment Patient Details Name: Margaret Henry MRN: [id number] DOB: 03-Aug-1951 Today's Date: 06/16/2021    History of Present Illness 70 y.o. female presented 06/13/21 for syncope and collapse. + COVID  PMH significant of chronic COPD  with chronic respiratory failure on 2L oxygen prn, hyperlipidemia, GERD, coronary arterial sclerosis    PT Comments    Pt supine in bed on entry, reports she was sitting up but with all her coughing her ribs hurt so much that she needed to lie down. Pt request to go to bathroom and agreeable to sit up in recliner and try to eat some soup. Pt is limited in safe mobility by O2 desaturation to 82%O2 with short distance ambulation, requiring purse lip breathing and 6L O2 to rebound. After 4 min, able to decrease O2 to 4L O2 and pt able to maintain SaO2 90%O2. Pt also experiencing increasing weakness, requiring UE support on furniture and min A to get to bathroom. Pt able to ambulate from bathroom to recliner with RW, however experiences knee buckling with distance. PT currently recommending  HHPT for work on strength and balance. PT will continue to follow acutely.    Follow Up Recommendations  Supervision - Intermittent;Home health PT     Equipment Recommendations  Rolling walker with 5" wheels       Precautions / Restrictions Precautions Precautions: Fall;Other (comment) Precaution Comments: desaturates on room air Restrictions Weight Bearing Restrictions: No    Mobility  Bed Mobility Overal bed mobility: Independent                  Transfers Overall transfer level: Needs assistance Equipment used: 1 person hand held assist Transfers: Sit to/from Stand;Stand Pivot Transfers Sit to Stand: Min guard         General transfer comment: slightly unsteady as stepping around to [facility]  Ambulation/Gait Ambulation/Gait assistance: Min assist;Min guard Gait Distance (Feet): 20 Feet Assistive device: 1 person hand held assist;Rolling  walker (2 wheeled) Gait Pattern/deviations: Step-through pattern;Decreased stride length Gait velocity: slowed Gait velocity interpretation: <1.31 ft/sec, indicative of household ambulator General Gait Details: min A for HHA to bathroom, min guard for ambulation from bathroom around bed to recliner.  limited by 4/4 DoE, and bilateral LE knee buckling          Balance Overall balance assessment: Needs assistance Sitting-balance support: No upper extremity supported;Feet supported;Feet unsupported Sitting balance-Leahy Scale: Good     Standing balance support: Single extremity supported;Bilateral upper extremity supported;During functional activity Standing balance-Leahy Scale: Poor Standing balance comment: pt currently requiring at least single UE support to maintain balance.                            Cognition Arousal/Alertness: Awake/alert Behavior During Therapy: Flat affect Overall Cognitive Status: Within Functional Limits for tasks assessed                                           General Comments General comments (skin integrity, edema, etc.): HR 98-120 bpm, Pt on 2L O2 via [place] in supine on entry, with ambulation requires 4 L O2 to get to bathroom with SaO2 >90%O2, except for 2 bouts of coughing when it dropped to 87%O2, with ambulation from bathroom to recliner, pt requires 6L O2 to recover from drop to 82%O2 with ambulation, after 3 min able  to decreased to 4L O2, SaO2 90%O2, left at 4L for pt to eat lunch      Pertinent Vitals/Pain Pain Assessment: Faces Faces Pain Scale: Hurts little more Pain Location: ribs with coughing Pain Descriptors / Indicators: Jabbing;Aching;Sore Pain Intervention(s): Limited activity within patient's tolerance;Monitored during session;Repositioned     PT Goals (current goals can now be found in the care plan section) Acute Rehab PT Goals Patient Stated Goal: go home without needing a RW PT Goal Formulation:  With patient Time For Goal Achievement: 06/29/21 Potential to Achieve Goals: Good Progress towards PT goals: Progressing toward goals    Frequency    Min 3X/week      PT Plan Current plan remains appropriate       AM-PAC PT "6 Clicks" Mobility   Outcome Measure  Help needed turning from your back to your side while in a flat bed without using bedrails?: None Help needed moving from lying on your back to sitting on the side of a flat bed without using bedrails?: None Help needed moving to and from a bed to a chair (including a wheelchair)?: A Little Help needed standing up from a chair using your arms (e.g., wheelchair or bedside chair)?: A Little Help needed to walk in hospital room?: A Little Help needed climbing 3-5 steps with a railing? : A Little 6 Click Score: 20    End of Session Equipment Utilized During Treatment: Oxygen Activity Tolerance: Treatment limited secondary to medical complications (Comment) (desaturation) Patient left: in chair;with call bell/phone within reach Nurse Communication: Mobility status;Other (comment) (need to walk to bathroom with RW) PT Visit Diagnosis: Unsteadiness on feet (R26.81)     Time: [id number] PT Time Calculation (min) (ACUTE ONLY): 32 min  Charges:  $Therapeutic Activity: 23-37 mins                     Finnick Orosz B. Migdalia Dk PT, DPT Acute Rehabilitation Services Pager [phone] Office [phone]    [place] 06/16/2021, 1:51 PM

## 2021-06-16 NOTE — Progress Notes (Signed)
PROGRESS NOTE    Margaret Henry  [id number] DOB: [birth year] DOA: 06/13/2021 PCP: [name], MD    Chief Complaint  Patient presents with   Fall    Brief Narrative:   Margaret Henry is a 70 y.o. female with medical history significant of chronic COPD  with chronic respiratory failure on 2L oxygen prn, hyperlipidemia, GERD, coronary arterial sclerosis who presented for syncope and collapse.  She states she has been sick since Saturday. She states she had a sore throat, was achy and had some chills.  She is on 2 L nasal cannula at baseline, her work-up was significant for COVID-19 infection, still exacerbation, with worsening oxygen requirement, she was admitted for further work-up.  Assessment & Plan:   Principal Problem:   Syncope and collapse Active Problems:   Hyperlipidemia   COPD exacerbation (HCC)   COVID-19 virus infection   Acute on chronic respiratory failure with hypoxia (HCC)   Syncope and collapse -70 year old female presenting with syncope and collapse found to have positive COVID-19 infection as well as possible COPD exacerbation and acute on chronic respiratory failure. -CTA negative for PE -2D echo with a preserved EF, grade 1 diastolic dysfunction, and mild to moderate aortic regurg -Continue to monitor on telemetry  Acute on chronic hypoxic respiratory failure -At baseline on 2 L nasal cannula, increased oxygen requirement up to 5 L yesterday, this is currently improving with IV steroids. -this is most likely in the setting of COPDexacerbation more than COVID-19   COPD exacerbation (HCC) -Known COPD Gold D with chronic respiratory failure on 2 L of oxygen at home.  Has severe obstructive lung disease, severe diffusion impairment. FEV1: 27% -She remains to complain of shortness of breath, cough and related musculoskeletal chest pain, she desatted to 83% on 3 L nasal cannula likely yesterday.  So I will continue with current dose IV steroids without  further tapering daily. -Clinical history with decreased sputum,  no worsening cough, not necessarily consistent with exacerbation; does have increased oxygen need as well as COVID-19  infection which could exacerbate her COPD.  Responded well to DuoNeb in ER. -Continue with doxycycline. -SABA prn  Continue her Spiriva and Breo.  She has been tried on Trelegy inhaler in 2019 but had to go back to [place] since it made her breathing worse.  Also tried on Daliresp but had side effects. -She remains with significantly diminished air entry today, will keep on current dose IV steroids with no tapering.  1/4 blood cultures positive for gram-positive cocci -Most likely contamination, will continue to monitor on Doxycycline  with no broadening of antibiotics pending final results.     COVID-19 virus infection -covid labs pending. CTA negative for PE and CXR with no acute process  -unsure if acute on chronic respiratory failure is more from her COPD versus COVID or a combination of both or possible underlying cardiac condition with syncope -continue IV steroids BID -She does report cough, congestion which Is more related to COVID-19 habits point.      Hyperlipidemia/atherosclerosis of aorta -Recent lipid panel 05/27/2021 showed LDL of 70 HDL 58. -Continue Lipitor and antiplatelet therapy   Hx of anemia As not been taking her oral iron      DVT prophylaxis: Lovenox Code Status: DNR Family Communication: None at bedside Disposition:   Status is: Inpatient  Remains inpatient appropriate because:IV treatments appropriate due to intensity of illness or inability to take PO  Dispo: The patient is from: Home  Anticipated d/c is to: Home              Patient currently is not medically stable to d/c.   Difficult to place patient No       Consultants:  None   Subjective:  Still reports some dyspnea, congestion, reports unable to produce any  phlegm Objective: Vitals:   06/15/21 2000 06/16/21 0000 06/16/21 0734 06/16/21 1104  BP: 130/64 (!) 142/71 (!) 157/70 (!) 148/85  Pulse: 100 60 (!) 104 98  Resp: '20 12 18 17  '$ Temp: 98.1 F (36.7 C) 97.9 F (36.6 C) 97.9 F (36.6 C) 98.2 F (36.8 C)  TempSrc: Oral Oral Oral Oral  SpO2: 91% 100% 90% 90%  Weight:      Height:       No intake or output data in the 24 hours ending 06/16/21 1550  Filed Weights   06/13/21 1218 06/13/21 2147  Weight: 54.4 kg 53.5 kg    Examination:  Awake Alert, Oriented X 3, No new F.N deficits, Normal affect Symmetrical Chest wall movement, fair air entry bilaterally. RRR,No Gallops,Rubs or new Murmurs, No Parasternal Heave +ve B.Sounds, Abd Soft, No tenderness, No rebound - guarding or rigidity. No Cyanosis, Clubbing or edema, No new Rash or bruise       Data Reviewed: I have personally reviewed following labs and imaging studies  CBC: Recent Labs  Lab 06/13/21 1240 06/14/21 0058 06/15/21 0220 06/16/21 0353  WBC 5.9 5.2 7.0 7.0  NEUTROABS 4.2 4.4 5.8 5.9  HGB 10.0* 9.6* 8.7* 8.7*  HCT 33.3* 30.6* 27.9* 27.9*  MCV 83.0 80.7 81.3 80.6  PLT 281 310 263 [id number]    Basic Metabolic Panel: Recent Labs  Lab 06/13/21 1240 06/14/21 0058 06/15/21 0220 06/16/21 0353  NA 135 136 136 138  K 3.7 3.9 4.3 4.4  CL 106 107 105 104  CO2 '23 22 25 26  '$ GLUCOSE 119* 150* 124* 125*  BUN '16 15 18 19  '$ CREATININE 0.71 0.71 0.60 0.54  CALCIUM 8.2* 9.1 9.3 9.4  MG  --  2.2 2.0 2.2  PHOS  --  4.3 3.5 3.3    GFR: Estimated Creatinine Clearance: 54.5 mL/min (by C-G formula based on SCr of 0.54 mg/dL).  Liver Function Tests: Recent Labs  Lab 06/13/21 1240 06/14/21 0058 06/15/21 0220 06/16/21 0353  AST '22 22 21 19  '$ ALT '13 11 11 13  '$ ALKPHOS 56 56 44 43  BILITOT 0.6 0.4 0.4 0.3  PROT 6.0* 6.4* 5.7* 5.5*  ALBUMIN 3.2* 3.3* 3.0* 2.9*    CBG: No results for input(s): GLUCAP in the last 168 hours.   Recent Results (from the past 240  hour(s))  Resp Panel by RT-PCR (Flu A&B, Covid) Nasopharyngeal Swab     Status: Abnormal   Collection Time: 06/13/21 12:40 PM   Specimen: Nasopharyngeal Swab; Nasopharyngeal(NP) swabs in vial transport medium  Result Value Ref Range Status   SARS Coronavirus 2 by RT PCR POSITIVE (A) NEGATIVE Final    Comment: RESULT CALLED TO, READ BACK BY AND VERIFIED WITH: RN C.CHAPMAN AT [id number] ON 06/13/2021 BY T.SAAD. (NOTE) SARS-CoV-2 target nucleic acids are DETECTED.  The SARS-CoV-2 RNA is generally detectable in upper respiratory specimens during the acute phase of infection. Positive results are indicative of the presence of the identified virus, but do not rule out bacterial infection or co-infection with other pathogens not detected by the test. Clinical correlation with patient history and other diagnostic information is necessary to determine patient infection  status. The expected result is Negative.  Fact Sheet for Patients: EntrepreneurPulse.com.au  Fact Sheet for Healthcare Providers: IncredibleEmployment.be  This test is not yet approved or cleared by the [place] FDA and  has been authorized for detection and/or diagnosis of SARS-CoV-2 by FDA under an Emergency Use Authorization (EUA).  This EUA will remain in effect (meaning this  test can be used) for the duration of  the COVID-19 declaration under Section 564(b)(1) of the Act, 21 U.S.C. section 360bbb-3(b)(1), unless the authorization is terminated or revoked sooner.     Influenza A by PCR NEGATIVE NEGATIVE Final   Influenza B by PCR NEGATIVE NEGATIVE Final    Comment: (NOTE) The Xpert Xpress SARS-CoV-2/FLU/RSV plus assay is intended as an aid in the diagnosis of influenza from Nasopharyngeal swab specimens and should not be used as a sole basis for treatment. Nasal washings and aspirates are unacceptable for Xpert Xpress SARS-CoV-2/FLU/RSV testing.  Fact Sheet for  Patients: EntrepreneurPulse.com.au  Fact Sheet for Healthcare Providers: IncredibleEmployment.be  This test is not yet approved or cleared by the [place] FDA and has been authorized for detection and/or diagnosis of SARS-CoV-2 by FDA under an Emergency Use Authorization (EUA). This EUA will remain in effect (meaning this test can be used) for the duration of the COVID-19 declaration under Section 564(b)(1) of the Act, 21 U.S.C. section 360bbb-3(b)(1), unless the authorization is terminated or revoked.  Performed at [facility], [place] [address], [place], [place] [address]   Blood culture (routine x 2)     Status: None (Preliminary result)   Collection Time: 06/13/21 12:41 PM   Specimen: BLOOD  Result Value Ref Range Status   Specimen Description BLOOD RIGHT ANTECUBITAL  Final   Special Requests   Final    BOTTLES DRAWN AEROBIC AND ANAEROBIC Blood Culture results may not be optimal due to an inadequate volume of blood received in culture bottles   Culture   Final    NO GROWTH 3 DAYS Performed at [facility], [place] [address], [place], [place] [address]    Report Status PENDING  Incomplete  Blood culture (routine x 2)     Status: Abnormal   Collection Time: 06/13/21 12:41 PM   Specimen: BLOOD LEFT FOREARM  Result Value Ref Range Status   Specimen Description BLOOD LEFT FOREARM  Final   Special Requests   Final    BOTTLES DRAWN AEROBIC ONLY Blood Culture adequate volume   Culture  Setup Time   Final    GRAM POSITIVE COCCI AEROBIC BOTTLE ONLY CRITICAL RESULT CALLED TO, READ BACK BY AND VERIFIED WITH: PHARMD A LAWLESS [id number] AT 23 AM B Y CM    Culture (A)  Final    STAPHYLOCOCCUS HOMINIS THE SIGNIFICANCE OF ISOLATING THIS ORGANISM FROM A SINGLE SET OF BLOOD CULTURES WHEN MULTIPLE SETS ARE DRAWN IS UNCERTAIN. PLEASE NOTIFY THE MICROBIOLOGY DEPARTMENT WITHIN ONE WEEK IF SPECIATION AND SENSITIVITIES ARE REQUIRED. Performed at  [facility], [place] [address] [place], [place] [address]    Report Status 06/16/2021 FINAL  Final  Blood Culture ID Panel (Reflexed)     Status: Abnormal   Collection Time: 06/13/21 12:41 PM  Result Value Ref Range Status   Enterococcus faecalis NOT DETECTED NOT DETECTED Final   Enterococcus Faecium NOT DETECTED NOT DETECTED Final   Listeria monocytogenes NOT DETECTED NOT DETECTED Final   Staphylococcus species DETECTED (A) NOT DETECTED Final    Comment: CRITICAL RESULT CALLED TO, READ BACK BY  AND VERIFIED WITH: PHARMD A LAWLESS [id number] AT 21 AM BY CM    Staphylococcus aureus (BCID) NOT DETECTED NOT DETECTED Final   Staphylococcus epidermidis NOT DETECTED NOT DETECTED Final   Staphylococcus lugdunensis NOT DETECTED NOT DETECTED Final   Streptococcus species NOT DETECTED NOT DETECTED Final   Streptococcus agalactiae NOT DETECTED NOT DETECTED Final   Streptococcus pneumoniae NOT DETECTED NOT DETECTED Final   Streptococcus pyogenes NOT DETECTED NOT DETECTED Final   A.calcoaceticus-baumannii NOT DETECTED NOT DETECTED Final   Bacteroides fragilis NOT DETECTED NOT DETECTED Final   Enterobacterales NOT DETECTED NOT DETECTED Final   Enterobacter cloacae complex NOT DETECTED NOT DETECTED Final   Escherichia coli NOT DETECTED NOT DETECTED Final   Klebsiella aerogenes NOT DETECTED NOT DETECTED Final   Klebsiella oxytoca NOT DETECTED NOT DETECTED Final   Klebsiella pneumoniae NOT DETECTED NOT DETECTED Final   Proteus species NOT DETECTED NOT DETECTED Final   Salmonella species NOT DETECTED NOT DETECTED Final   Serratia marcescens NOT DETECTED NOT DETECTED Final   Haemophilus influenzae NOT DETECTED NOT DETECTED Final   Neisseria meningitidis NOT DETECTED NOT DETECTED Final   Pseudomonas aeruginosa NOT DETECTED NOT DETECTED Final   Stenotrophomonas maltophilia NOT DETECTED NOT DETECTED Final   Candida albicans NOT DETECTED NOT DETECTED Final   Candida auris NOT DETECTED NOT DETECTED  Final   Candida glabrata NOT DETECTED NOT DETECTED Final   Candida krusei NOT DETECTED NOT DETECTED Final   Candida parapsilosis NOT DETECTED NOT DETECTED Final   Candida tropicalis NOT DETECTED NOT DETECTED Final   Cryptococcus neoformans/gattii NOT DETECTED NOT DETECTED Final    Comment: Performed at [facility], [address] [place], [place] [address]  Group A Strep by PCR     Status: None   Collection Time: 06/13/21 12:56 PM   Specimen: Throat; Sterile Swab  Result Value Ref Range Status   Group A Strep by PCR NOT DETECTED NOT DETECTED Final    Comment: Performed at [facility], [place] [address], [place], [place] [address]         Radiology Studies: No results found.      Scheduled Meds:  vitamin C  1,000 mg Oral BID WC   cholecalciferol  5,000 Units Oral Daily   doxycycline  100 mg Oral Q12H   enoxaparin (LOVENOX) injection  40 mg Subcutaneous Q24H   lidocaine  1 patch Transdermal Q24H   melatonin  5 mg Oral QHS   methylPREDNISolone (SOLU-MEDROL) injection  60 mg Intravenous Q12H   pantoprazole  40 mg Oral Daily   zinc sulfate  220 mg Oral Daily   Continuous Infusions:   LOS: 3 days        Phillips Climes, MD [facility]   To contact the attending provider between 7A-7P or the covering provider during after hours 7P-7A, please log into the web site [email or web address] and access using universal [place] password for that web site. If you do not have the password, please call the hospital operator.  06/16/2021, 3:50 PM

## 2021-06-17 ENCOUNTER — Inpatient Hospital Stay (HOSPITAL_COMMUNITY): Payer: Medicare HMO

## 2021-06-17 LAB — COMPREHENSIVE METABOLIC PANEL
ALT: 15 U/L (ref 0–44)
AST: 23 U/L (ref 15–41)
Albumin: 3.6 g/dL (ref 3.5–5.0)
Alkaline Phosphatase: 46 U/L (ref 38–126)
Anion gap: 7 (ref 5–15)
BUN: 21 mg/dL (ref 8–23)
CO2: 26 mmol/L (ref 22–32)
Calcium: 9.8 mg/dL (ref 8.9–10.3)
Chloride: 104 mmol/L (ref 98–111)
Creatinine, Ser: 0.64 mg/dL (ref 0.44–1.00)
GFR, Estimated: >60 mL/min (ref >60)
Glucose, Bld: 114 mg/dL — ABNORMAL HIGH (ref 70–99)
Potassium: 4.6 mmol/L (ref 3.5–5.1)
Sodium: 137 mmol/L (ref 135–145)
Total Bilirubin: 0.7 mg/dL (ref 0.3–1.2)
Total Protein: 6.6 g/dL (ref 6.5–8.1)

## 2021-06-17 LAB — MAGNESIUM: Magnesium: 2.2 mg/dL (ref 1.7–2.4)

## 2021-06-17 LAB — CBC WITH DIFFERENTIAL/PLATELET
Abs Immature Granulocytes: 0.05 10*3/uL (ref 0.00–0.07)
Basophils Absolute: 0 10*3/uL (ref 0.0–0.1)
Basophils Relative: 0 %
Eosinophils Absolute: 0 10*3/uL (ref 0.0–0.5)
Eosinophils Relative: 0 %
HCT: 33 % — ABNORMAL LOW (ref 36.0–46.0)
Hemoglobin: 10.2 g/dL — ABNORMAL LOW (ref 12.0–15.0)
Immature Granulocytes: 1 %
Lymphocytes Relative: 11 %
Lymphs Abs: 1 10*3/uL (ref 0.7–4.0)
MCH: 24.8 pg — ABNORMAL LOW (ref 26.0–34.0)
MCHC: 30.9 g/dL (ref 30.0–36.0)
MCV: 80.1 fL (ref 80.0–100.0)
Monocytes Absolute: 1.3 10*3/uL — ABNORMAL HIGH (ref 0.1–1.0)
Monocytes Relative: 15 %
Neutro Abs: 6.6 10*3/uL (ref 1.7–7.7)
Neutrophils Relative %: 73 %
Platelets: 290 10*3/uL (ref 150–400)
RBC: 4.12 MIL/uL (ref 3.87–5.11)
RDW: 19.7 % — ABNORMAL HIGH (ref 11.5–15.5)
WBC: 9 10*3/uL (ref 4.0–10.5)
nRBC: 0 % (ref 0.0–0.2)

## 2021-06-17 LAB — D-DIMER, QUANTITATIVE: D-Dimer, Quant: 0.72 ug/mL-FEU — ABNORMAL HIGH (ref 0.00–0.50)

## 2021-06-17 LAB — PHOSPHORUS: Phosphorus: 3.5 mg/dL (ref 2.5–4.6)

## 2021-06-17 LAB — FERRITIN: Ferritin: 43 ng/mL (ref 11–307)

## 2021-06-17 LAB — C-REACTIVE PROTEIN: CRP: <0.5 mg/dL (ref <1.0)

## 2021-06-17 MED ORDER — PANTOPRAZOLE SODIUM 40 MG PO TBEC
40.0000 mg | DELAYED_RELEASE_TABLET | Freq: Every day | ORAL | 0 refills | Status: DC
  Filled 2021-06-19: qty 30, 30d supply, fill #0

## 2021-06-17 MED ORDER — PREDNISONE 10 MG PO TABS
ORAL_TABLET | ORAL | 0 refills | Status: DC
  Filled 2021-06-19: qty 21, 6d supply, fill #0

## 2021-06-17 NOTE — Discharge Instructions (Signed)
Follow with Primary MD [name], MD in 10 days   Get CBC, CMP, 2 view Chest X ray checked  by Primary MD next visit.    Activity: As tolerated with Full fall precautions use walker/cane & assistance as needed   Disposition Home    Diet: Heart Healthy .   On your next visit with your primary care physician please Get Medicines reviewed and adjusted.   Please request your Prim.MD to go over all Hospital Tests and Procedure/Radiological results at the follow up, please get all Hospital records sent to your Prim MD by signing hospital release before you go home.   If you experience worsening of your admission symptoms, develop shortness of breath, life threatening emergency, suicidal or homicidal thoughts you must seek medical attention immediately by calling 911 or calling your MD immediately  if symptoms less severe.  You Must read complete instructions/literature along with all the possible adverse reactions/side effects for all the Medicines you take and that have been prescribed to you. Take any new Medicines after you have completely understood and accpet all the possible adverse reactions/side effects.  Do not drive when taking Pain medications.    Do not take more than prescribed Pain, Sleep and Anxiety Medications  Special Instructions: If you have smoked or chewed Tobacco  in the last 2 yrs please stop smoking, stop any regular Alcohol  and or any Recreational drug use.  Wear Seat belts while driving.   Please note  You were cared for by a hospitalist during your hospital stay. If you have any questions about your discharge medications or the care you received while you were in the hospital after you are discharged, you can call the unit and asked to speak with the hospitalist on call if the hospitalist that took care of you is not available. Once you are discharged, your primary care physician will handle any further medical issues. Please note that NO REFILLS for any  discharge medications will be authorized once you are discharged, as it is imperative that you return to your primary care physician (or establish a relationship with a primary care physician if you do not have one) for your aftercare needs so that they can reassess your need for medications and monitor your lab values.

## 2021-06-17 NOTE — TOC Transition Note (Signed)
Transition of Care [facility]) - CM/SW Discharge Note   Patient Details  Name: EVOLA ISIDORO MRN: [id number] Date of Birth: 10/31/1950  Transition of Care [facility]) CM/SW Contact:  Carles Collet, RN Phone Number: 06/17/2021, 11:20 AM   Clinical Narrative:    Damaris Schooner w patient over the phone.  She endorses needing RW, ordered, will be delivered to room in time for patient to take home tomorrow. Active w [employer] for home oxygen Discussed HH, patient would like HH PT, discussed providers, reviewed previous ones, and she would like [facility], referral accepted. Son will provide transport home tomorrow and bring home O2 tanks      Final next level of care: [place] Barriers to Discharge: No Barriers Identified   Patient Goals and CMS Choice Patient states their goals for this hospitalization and ongoing recovery are:: to go home CMS Medicare.gov Compare Post Acute Care list provided to:: Patient Choice offered to / list presented to : Patient  Discharge Placement                       Discharge Plan and Services                DME Arranged: Walker rolling DME Agency: [employer] Date DME Agency Contacted: 06/17/21 Time DME Agency Contacted: 1119 Representative spoke with at DME Agency: [place]: PT [place]: [place] Date [place]: 06/17/21 Time [place]: 1119 Representative spoke with at [place]: [place] ([place]) Interventions     Readmission Risk Interventions No flowsheet data found.

## 2021-06-17 NOTE — Progress Notes (Signed)
PROGRESS NOTE    Margaret Henry  [id number] DOB: 1950-12-03 DOA: 06/13/2021 PCP: [name], MD    Chief Complaint  Patient presents with   Fall    Brief Narrative:   Margaret Henry is a 70 y.o. female with medical history significant of chronic COPD  with chronic respiratory failure on 2L oxygen prn, hyperlipidemia, GERD, coronary arterial sclerosis who presented for syncope and collapse.  She states she has been sick since Saturday. She states she had a sore throat, was achy and had some chills.  She is on 2 L nasal cannula at baseline, her work-up was significant for COVID-19 infection, still exacerbation, with worsening oxygen requirement, she was admitted for further work-up.  Assessment & Plan:   Principal Problem:   Syncope and collapse Active Problems:   Hyperlipidemia   COPD exacerbation (HCC)   COVID-19 virus infection   Acute on chronic respiratory failure with hypoxia (HCC)   Syncope and collapse -70 year old female presenting with syncope and collapse found to have positive COVID-19 infection as well as possible COPD exacerbation and acute on chronic respiratory failure. -CTA negative for PE -2D echo with a preserved EF, grade 1 diastolic dysfunction, and mild to moderate aortic regurg -No significant on Telemetry  Acute on chronic hypoxic respiratory failure -At baseline on 2 L nasal cannula, this is currently improving with IV steroids.  She is with dyspnea with shortness of breath with  activity. -this is most likely in the setting of COPDexacerbation more than COVID-19   COPD exacerbation (HCC) -Known COPD Gold D with chronic respiratory failure on 2 L of oxygen at home.  Has severe obstructive lung disease, severe diffusion impairment. FEV1: 27% -Clinical history with decreased sputum,  no worsening cough, not necessarily consistent with exacerbation; does have increased oxygen need as well as COVID-19  infection which could exacerbate her COPD.   Responded well to DuoNeb in ER. -Continue with doxycycline -SABA prn  Continue her Spiriva and Breo.  She has been tried on Trelegy inhaler in 2019 but had to go back to [place] since it made her breathing worse.  Also tried on Daliresp but had side effects. -Treated with doxycycline, she remains on IV steroids, her air entry has improved, wheezing has improved as well, but she remains quite symptomatic with minimal activity and significant shortness of breath, discussed with her we will hold on discharge today and will await another 24 hours and monitor her progression.  1/4 blood cultures positive for gram-positive cocci -Secondary to contamination, no indication to treat.     COVID-19 virus infection -covid labs pending. CTA negative for PE and CXR with no acute process  -unsure if acute on chronic respiratory failure is more from her COPD versus COVID or a combination of both or possible underlying cardiac condition with syncope -continue IV steroids BID -She does report cough, congestion which Is more related to COVID-19 habits point.      Hyperlipidemia/atherosclerosis of aorta -Recent lipid panel 05/27/2021 showed LDL of 70 HDL 58. -Continue Lipitor and antiplatelet therapy   Hx of anemia As not been taking her oral iron      DVT prophylaxis: Lovenox Code Status: DNR Family Communication: None at bedside Disposition:   Status is: Inpatient  Remains inpatient appropriate because:IV treatments appropriate due to intensity of illness or inability to take PO  Dispo: The patient is from: Home              Anticipated d/c is  to: Home              Patient currently is not medically stable to d/c.   Difficult to place patient No       Consultants:  None   Subjective: She still reporting some dyspnea, cough spells causing musculoskeletal chest pain. Objective: Vitals:   06/16/21 2010 06/17/21 0000 06/17/21 0832 06/17/21 1235  BP: (!) 155/73 (!) 173/73 (!)  158/70 (!) 168/68  Pulse: 91 90 100 100  Resp: '17 18 17 18  '$ Temp: 98.1 F (36.7 C) 98.5 F (36.9 C) 98.1 F (36.7 C) 97.9 F (36.6 C)  TempSrc: Oral Oral Oral Oral  SpO2: 93% 92% 93% 97%  Weight:      Height:       No intake or output data in the 24 hours ending 06/17/21 1404  Filed Weights   06/13/21 1218 06/13/21 2147  Weight: 54.4 kg 53.5 kg    Examination:  Awake Alert, Oriented X 3, frail, thin appearing female Symmetrical Chest wall movement, diminished air entry, with some wheezing. RRR,No Gallops,Rubs or new Murmurs, No Parasternal Heave +ve B.Sounds, Abd Soft, No tenderness, No rebound - guarding or rigidity. No Cyanosis, Clubbing or edema, No new Rash or bruise        Data Reviewed: I have personally reviewed following labs and imaging studies  CBC: Recent Labs  Lab 06/13/21 1240 06/14/21 0058 06/15/21 0220 06/16/21 0353 06/17/21 0110  WBC 5.9 5.2 7.0 7.0 9.0  NEUTROABS 4.2 4.4 5.8 5.9 6.6  HGB 10.0* 9.6* 8.7* 8.7* 10.2*  HCT 33.3* 30.6* 27.9* 27.9* 33.0*  MCV 83.0 80.7 81.3 80.6 80.1  PLT 281 310 263 247 [id number]    Basic Metabolic Panel: Recent Labs  Lab 06/13/21 1240 06/14/21 0058 06/15/21 0220 06/16/21 0353 06/17/21 0110  NA 135 136 136 138 137  K 3.7 3.9 4.3 4.4 4.6  CL 106 107 105 104 104  CO2 '23 22 25 26 26  '$ GLUCOSE 119* 150* 124* 125* 114*  BUN '16 15 18 19 21  '$ CREATININE 0.71 0.71 0.60 0.54 0.64  CALCIUM 8.2* 9.1 9.3 9.4 9.8  MG  --  2.2 2.0 2.2 2.2  PHOS  --  4.3 3.5 3.3 3.5    GFR: Estimated Creatinine Clearance: 54.5 mL/min (by C-G formula based on SCr of 0.64 mg/dL).  Liver Function Tests: Recent Labs  Lab 06/13/21 1240 06/14/21 0058 06/15/21 0220 06/16/21 0353 06/17/21 0110  AST '22 22 21 19 23  '$ ALT '13 11 11 13 15  '$ ALKPHOS 56 56 44 43 46  BILITOT 0.6 0.4 0.4 0.3 0.7  PROT 6.0* 6.4* 5.7* 5.5* 6.6  ALBUMIN 3.2* 3.3* 3.0* 2.9* 3.6    CBG: No results for input(s): GLUCAP in the last 168 hours.   Recent Results  (from the past 240 hour(s))  Resp Panel by RT-PCR (Flu A&B, Covid) Nasopharyngeal Swab     Status: Abnormal   Collection Time: 06/13/21 12:40 PM   Specimen: Nasopharyngeal Swab; Nasopharyngeal(NP) swabs in vial transport medium  Result Value Ref Range Status   SARS Coronavirus 2 by RT PCR POSITIVE (A) NEGATIVE Final    Comment: RESULT CALLED TO, READ BACK BY AND VERIFIED WITH: RN C.CHAPMAN AT [id number] ON 06/13/2021 BY T.SAAD. (NOTE) SARS-CoV-2 target nucleic acids are DETECTED.  The SARS-CoV-2 RNA is generally detectable in upper respiratory specimens during the acute phase of infection. Positive results are indicative of the presence of the identified virus, but do not rule out bacterial infection  or co-infection with other pathogens not detected by the test. Clinical correlation with patient history and other diagnostic information is necessary to determine patient infection status. The expected result is Negative.  Fact Sheet for Patients: EntrepreneurPulse.com.au  Fact Sheet for Healthcare Providers: IncredibleEmployment.be  This test is not yet approved or cleared by the [place] FDA and  has been authorized for detection and/or diagnosis of SARS-CoV-2 by FDA under an Emergency Use Authorization (EUA).  This EUA will remain in effect (meaning this  test can be used) for the duration of  the COVID-19 declaration under Section 564(b)(1) of the Act, 21 U.S.C. section 360bbb-3(b)(1), unless the authorization is terminated or revoked sooner.     Influenza A by PCR NEGATIVE NEGATIVE Final   Influenza B by PCR NEGATIVE NEGATIVE Final    Comment: (NOTE) The Xpert Xpress SARS-CoV-2/FLU/RSV plus assay is intended as an aid in the diagnosis of influenza from Nasopharyngeal swab specimens and should not be used as a sole basis for treatment. Nasal washings and aspirates are unacceptable for Xpert Xpress SARS-CoV-2/FLU/RSV testing.  Fact Sheet  for Patients: EntrepreneurPulse.com.au  Fact Sheet for Healthcare Providers: IncredibleEmployment.be  This test is not yet approved or cleared by the [place] FDA and has been authorized for detection and/or diagnosis of SARS-CoV-2 by FDA under an Emergency Use Authorization (EUA). This EUA will remain in effect (meaning this test can be used) for the duration of the COVID-19 declaration under Section 564(b)(1) of the Act, 21 U.S.C. section 360bbb-3(b)(1), unless the authorization is terminated or revoked.  Performed at [facility], [place] [address], [place], [place] [address]   Blood culture (routine x 2)     Status: None (Preliminary result)   Collection Time: 06/13/21 12:41 PM   Specimen: BLOOD  Result Value Ref Range Status   Specimen Description BLOOD RIGHT ANTECUBITAL  Final   Special Requests   Final    BOTTLES DRAWN AEROBIC AND ANAEROBIC Blood Culture results may not be optimal due to an inadequate volume of blood received in culture bottles   Culture   Final    NO GROWTH 3 DAYS Performed at [facility], [place] [address] [place], [place] [address]    Report Status PENDING  Incomplete  Blood culture (routine x 2)     Status: Abnormal   Collection Time: 06/13/21 12:41 PM   Specimen: BLOOD LEFT FOREARM  Result Value Ref Range Status   Specimen Description BLOOD LEFT FOREARM  Final   Special Requests   Final    BOTTLES DRAWN AEROBIC ONLY Blood Culture adequate volume   Culture  Setup Time   Final    GRAM POSITIVE COCCI AEROBIC BOTTLE ONLY CRITICAL RESULT CALLED TO, READ BACK BY AND VERIFIED WITH: PHARMD A LAWLESS [id number] AT 39 AM B Y CM    Culture (A)  Final    STAPHYLOCOCCUS HOMINIS THE SIGNIFICANCE OF ISOLATING THIS ORGANISM FROM A SINGLE SET OF BLOOD CULTURES WHEN MULTIPLE SETS ARE DRAWN IS UNCERTAIN. PLEASE NOTIFY THE MICROBIOLOGY DEPARTMENT WITHIN ONE WEEK IF SPECIATION AND SENSITIVITIES ARE REQUIRED. Performed at  [facility], [place] [address] [place], [place] [address]    Report Status 06/16/2021 FINAL  Final  Blood Culture ID Panel (Reflexed)     Status: Abnormal   Collection Time: 06/13/21 12:41 PM  Result Value Ref Range Status   Enterococcus faecalis NOT DETECTED NOT DETECTED Final   Enterococcus Faecium NOT DETECTED NOT DETECTED Final   Listeria monocytogenes  NOT DETECTED NOT DETECTED Final   Staphylococcus species DETECTED (A) NOT DETECTED Final    Comment: CRITICAL RESULT CALLED TO, READ BACK BY AND VERIFIED WITH: PHARMD A LAWLESS [id number] AT 855 AM BY CM    Staphylococcus aureus (BCID) NOT DETECTED NOT DETECTED Final   Staphylococcus epidermidis NOT DETECTED NOT DETECTED Final   Staphylococcus lugdunensis NOT DETECTED NOT DETECTED Final   Streptococcus species NOT DETECTED NOT DETECTED Final   Streptococcus agalactiae NOT DETECTED NOT DETECTED Final   Streptococcus pneumoniae NOT DETECTED NOT DETECTED Final   Streptococcus pyogenes NOT DETECTED NOT DETECTED Final   A.calcoaceticus-baumannii NOT DETECTED NOT DETECTED Final   Bacteroides fragilis NOT DETECTED NOT DETECTED Final   Enterobacterales NOT DETECTED NOT DETECTED Final   Enterobacter cloacae complex NOT DETECTED NOT DETECTED Final   Escherichia coli NOT DETECTED NOT DETECTED Final   Klebsiella aerogenes NOT DETECTED NOT DETECTED Final   Klebsiella oxytoca NOT DETECTED NOT DETECTED Final   Klebsiella pneumoniae NOT DETECTED NOT DETECTED Final   Proteus species NOT DETECTED NOT DETECTED Final   Salmonella species NOT DETECTED NOT DETECTED Final   Serratia marcescens NOT DETECTED NOT DETECTED Final   Haemophilus influenzae NOT DETECTED NOT DETECTED Final   Neisseria meningitidis NOT DETECTED NOT DETECTED Final   Pseudomonas aeruginosa NOT DETECTED NOT DETECTED Final   Stenotrophomonas maltophilia NOT DETECTED NOT DETECTED Final   Candida albicans NOT DETECTED NOT DETECTED Final   Candida auris NOT DETECTED NOT DETECTED  Final   Candida glabrata NOT DETECTED NOT DETECTED Final   Candida krusei NOT DETECTED NOT DETECTED Final   Candida parapsilosis NOT DETECTED NOT DETECTED Final   Candida tropicalis NOT DETECTED NOT DETECTED Final   Cryptococcus neoformans/gattii NOT DETECTED NOT DETECTED Final    Comment: Performed at [facility], [address], [place], [place] [address]  Group A Strep by PCR     Status: None   Collection Time: 06/13/21 12:56 PM   Specimen: Throat; Sterile Swab  Result Value Ref Range Status   Group A Strep by PCR NOT DETECTED NOT DETECTED Final    Comment: Performed at [facility], [place] [address], [place], [place] [address]         Radiology Studies: DG Chest Port 1 View  Result Date: 06/17/2021 CLINICAL DATA:  Dyspnea EXAM: PORTABLE CHEST 1 VIEW COMPARISON:  June 13, 2021 FINDINGS: The heart size and mediastinal contours are within normal limits. Both lungs are clear. The lungs are hyperinflated. The visualized skeletal structures are unremarkable. IMPRESSION: No active cardiopulmonary disease. Emphysema. Electronically Signed   By: Abelardo Diesel M.D.   On: 06/17/2021 11:58        Scheduled Meds:  vitamin C  1,000 mg Oral BID WC   cholecalciferol  5,000 Units Oral Daily   doxycycline  100 mg Oral Q12H   enoxaparin (LOVENOX) injection  40 mg Subcutaneous Q24H   lidocaine  1 patch Transdermal Q24H   melatonin  5 mg Oral QHS   methylPREDNISolone (SOLU-MEDROL) injection  60 mg Intravenous Q12H   pantoprazole  40 mg Oral Daily   zinc sulfate  220 mg Oral Daily   Continuous Infusions:   LOS: 4 days        Phillips Climes, MD [facility]   To contact the attending provider between 7A-7P or the covering provider during after hours 7P-7A, please log into the web site [email or web address] and access using universal [place] password for that web site. If you  do not have the password, please call the hospital operator.  06/17/2021, 2:04 PM

## 2021-06-18 LAB — CBC WITH DIFFERENTIAL/PLATELET
Abs Immature Granulocytes: 0.05 10*3/uL (ref 0.00–0.07)
Basophils Absolute: 0 10*3/uL (ref 0.0–0.1)
Basophils Relative: 0 %
Eosinophils Absolute: 0 10*3/uL (ref 0.0–0.5)
Eosinophils Relative: 0 %
HCT: 29.3 % — ABNORMAL LOW (ref 36.0–46.0)
Hemoglobin: 9.1 g/dL — ABNORMAL LOW (ref 12.0–15.0)
Immature Granulocytes: 1 %
Lymphocytes Relative: 11 %
Lymphs Abs: 0.9 10*3/uL (ref 0.7–4.0)
MCH: 25 pg — ABNORMAL LOW (ref 26.0–34.0)
MCHC: 31.1 g/dL (ref 30.0–36.0)
MCV: 80.5 fL (ref 80.0–100.0)
Monocytes Absolute: 0.9 10*3/uL (ref 0.1–1.0)
Monocytes Relative: 11 %
Neutro Abs: 5.8 10*3/uL (ref 1.7–7.7)
Neutrophils Relative %: 77 %
Platelets: 270 10*3/uL (ref 150–400)
RBC: 3.64 MIL/uL — ABNORMAL LOW (ref 3.87–5.11)
RDW: 19.9 % — ABNORMAL HIGH (ref 11.5–15.5)
WBC: 7.6 10*3/uL (ref 4.0–10.5)
nRBC: 0 % (ref 0.0–0.2)

## 2021-06-18 LAB — COMPREHENSIVE METABOLIC PANEL
ALT: 14 U/L (ref 0–44)
AST: 18 U/L (ref 15–41)
Albumin: 3.2 g/dL — ABNORMAL LOW (ref 3.5–5.0)
Alkaline Phosphatase: 35 U/L — ABNORMAL LOW (ref 38–126)
Anion gap: 7 (ref 5–15)
BUN: 16 mg/dL (ref 8–23)
CO2: 28 mmol/L (ref 22–32)
Calcium: 9.1 mg/dL (ref 8.9–10.3)
Chloride: 104 mmol/L (ref 98–111)
Creatinine, Ser: 0.55 mg/dL (ref 0.44–1.00)
GFR, Estimated: >60 mL/min (ref >60)
Glucose, Bld: 107 mg/dL — ABNORMAL HIGH (ref 70–99)
Potassium: 4.1 mmol/L (ref 3.5–5.1)
Sodium: 139 mmol/L (ref 135–145)
Total Bilirubin: 0.6 mg/dL (ref 0.3–1.2)
Total Protein: 5.5 g/dL — ABNORMAL LOW (ref 6.5–8.1)

## 2021-06-18 LAB — CULTURE, BLOOD (ROUTINE X 2): Culture: NO GROWTH

## 2021-06-18 LAB — D-DIMER, QUANTITATIVE: D-Dimer, Quant: 0.44 ug/mL-FEU (ref 0.00–0.50)

## 2021-06-18 LAB — FERRITIN: Ferritin: 44 ng/mL (ref 11–307)

## 2021-06-18 LAB — C-REACTIVE PROTEIN: CRP: 0.5 mg/dL (ref <1.0)

## 2021-06-18 LAB — PHOSPHORUS: Phosphorus: 3.5 mg/dL (ref 2.5–4.6)

## 2021-06-18 LAB — MAGNESIUM: Magnesium: 2.2 mg/dL (ref 1.7–2.4)

## 2021-06-18 NOTE — Discharge Summary (Signed)
Physician Discharge Summary  Margaret Henry [id number] DOB: [birth year] DOA: 06/13/2021  PCP: [name], MD  Admit date: 06/13/2021 Discharge date: 06/18/2021  Admitted From: Home Disposition:  Home  Recommendations for Outpatient Follow-up:  Follow up with PCP in 1-2 weeks Please obtain BMP/CBC in one week  Home Health:YES   Discharge Condition:Stable CODE STATUS:FULL Diet recommendation: Heart Healthy  Brief/Interim Summary:      Syncope and collapse -70 year old female presenting with syncope and collapse found to have positive COVID-19 infection as well as possible COPD exacerbation and acute on chronic respiratory failure. -CTA negative for PE -2D echo with a preserved EF, grade 1 diastolic dysfunction, and mild to moderate aortic regurg -No significant events on Telemetry   Acute on chronic hypoxic respiratory failure -At baseline on 2 L nasal cannula, her oxygen requirement was 45 L at 1 point, but this has improved, she is currently back to her baseline 2 L nasal cannula at rest.  She will require increased oxygen with activity.     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  Has severe obstructive lung disease, severe diffusion impairment. FEV1: 27% -She was treated with IV steroids during hospital stay treated with doxycycline as well, overall cough has improved, but still reports some residual cough, this is most likely related to COVID, as it is dry, she will be discharged on oral prednisone, continue with home medications including Spiriva and Breo.   1/4 blood cultures positive for gram-positive cocci -Secondary to contamination, no indication to treat.       COVID-19 virus infection -covid labs pending. CTA negative for PE and CXR with no acute process  -unsure if acute on chronic respiratory failure is more from her COPD versus COVID or a combination of both or possible underlying cardiac  condition with syncope -I have discussed with her multiple times initiation of COVID 19 directed therapy, but patient has declined .     Hyperlipidemia/atherosclerosis of aorta -Recent lipid panel 05/27/2021 showed LDL of 70 HDL 58. -Continue Lipitor and antiplatelet therapy   Hx of anemia At baseline, no indication for treatment.      Discharge Diagnoses:  Principal Problem:   Syncope and collapse Active Problems:   Hyperlipidemia   COPD exacerbation (HCC)   COVID-19 virus infection   Acute on chronic respiratory failure with hypoxia [facility])    Discharge Instructions  Discharge Instructions     Diet - low sodium heart healthy   Complete by: As directed    Increase activity slowly   Complete by: As directed       Allergies as of 06/18/2021       Reactions   Hydrocodone Nausea And Vomiting   Propoxyphene N-acetaminophen Nausea And Vomiting        Medication List     TAKE these medications    acetaminophen 500 MG tablet Commonly known as: TYLENOL Take 1,500 mg by mouth every 4 (four) hours as needed for headache.   albuterol 108 (90 Base) MCG/ACT inhaler Commonly known as: VENTOLIN HFA Inhale 2 puffs into the lungs every 6 (six) hours as needed for wheezing or shortness of breath. What changed: Another medication with the same name was changed. Make sure you understand how and when to take each.   albuterol (2.5 MG/3ML) 0.083% nebulizer solution Commonly known as: PROVENTIL INHALE THE CONTENTS OF 1 VIAL VIA NEBULIZER EVERY 6 HOURS AS NEEDED FOR WHEEZING OR SHORTNESS OF  BREATH What changed:  how much to take how to take this when to take this reasons to take this   aspirin 81 MG EC tablet Commonly known as: Aspirin Adult Low Strength TAKE 1 TABLET (81 MG TOTAL) BY MOUTH DAILY. What changed:  how much to take how to take this when to take this additional instructions   atorvastatin 40 MG tablet Commonly known as: LIPITOR Take 1 tablet (40 mg total)  by mouth daily. What changed: when to take this   Breo Ellipta 200-25 MCG/INH Aepb Generic drug: fluticasone furoate-vilanterol Inhale 1 puff into the lungs daily.   fluticasone 50 MCG/ACT nasal spray Commonly known as: FLONASE USE 2 SPRAYS IN EACH NOSTRIL EVERY DAY What changed:  how much to take how to take this when to take this reasons to take this additional instructions   loratadine 10 MG tablet Commonly known as: CLARITIN TAKE 1 TABLET (10 MG TOTAL) BY MOUTH DAILY.   OXYGEN Inhale 2 L into the lungs daily.   pantoprazole 40 MG tablet Commonly known as: PROTONIX Take 1 tablet (40 mg total) by mouth daily.   predniSONE 10 MG (21) Tbpk tablet Commonly known as: STERAPRED UNI-PAK 21 TAB Use per package instructions   Spiriva Respimat 2.5 MCG/ACT Aers Generic drug: Tiotropium Bromide Monohydrate Inhale 2 puffs into the lungs daily.               Durable Medical Equipment  (From admission, onward)           Start     Ordered   06/17/21 1118  For home use only DME Walker rolling  Once       Question Answer Comment  Walker: With 5 Inch Wheels   Patient needs a walker to treat with the following condition Weakness      06/17/21 1117            Follow-up Information     [name], MD Follow up.   Specialty: Family Medicine Contact information: [place] [address] [phone]         [facility] Follow up.   Specialty: Home Health Services Why: for home health services. they will call you in 2-3 days to set up your first home appointment Contact information: [place] [address] [phone]                Allergies  Allergen Reactions   Hydrocodone Nausea And Vomiting   Propoxyphene N-Acetaminophen Nausea And Vomiting    Consultations: None   Procedures/Studies: DG Chest 2 View  Result Date: 06/13/2021 CLINICAL DATA:  Cough, chest pain, fall EXAM: CHEST  - 2 VIEW COMPARISON:  Chest radiograph 04/12/2021 FINDINGS: The cardiomediastinal silhouette is stable with unchanged atherosclerotic plaque of the aortic arch. The lungs are hyperlucent consistent with emphysema. Streaky opacities in the lower lobe seen on the lateral projection are similar to the prior study. There is no new focal airspace disease. There is no pleural effusion or pneumothorax. There is no acute osseous abnormality. IMPRESSION: COPD. Otherwise, no radiographic evidence of acute cardiopulmonary process. Electronically Signed   By: Valetta Mole M.D.   On: 06/13/2021 14:14   CT HEAD WO CONTRAST (5MM)  Result Date: 06/13/2021 CLINICAL DATA:  Head trauma, moderate/severe. EXAM: CT HEAD WITHOUT CONTRAST TECHNIQUE: Contiguous axial images were obtained from the base of the skull through the vertex without intravenous contrast. COMPARISON:  MRI head 03/28/2012. FINDINGS: Brain: No evidence  of acute infarction, hemorrhage, hydrocephalus, extra-axial collection or mass lesion/mass effect. Vascular: There are minimal atherosclerotic calcifications of the intracranial internal carotid arteries. Skull: Normal. Negative for fracture or focal lesion. Sinuses/Orbits: Air-fluid levels are seen in posterior right ethmoid air cells and within the right sphenoid sinus. The mastoid air cells are clear. Other: None. IMPRESSION: 1. No acute intracranial abnormality. 2. Right ethmoid and right sphenoid sinusitis. Electronically Signed   By: Ronney Asters M.D.   On: 06/13/2021 15:54   CT Angio Chest PE W and/or Wo Contrast  Result Date: 06/13/2021 CLINICAL DATA:  Shortness of breath, syncope, fall, chest pain EXAM: CT ANGIOGRAPHY CHEST WITH CONTRAST TECHNIQUE: Multidetector CT imaging of the chest was performed using the standard protocol during bolus administration of intravenous contrast. Multiplanar CT image reconstructions and MIPs were obtained to evaluate the vascular anatomy. CONTRAST:  36m OMNIPAQUE IOHEXOL  350 MG/ML SOLN COMPARISON:  04/25/2021 FINDINGS: Cardiovascular: Pulmonary arteries are normal in caliber and patent. Negative for significant acute pulmonary embolus by CTA. Extensive calcific atherosclerosis of the aorta. No significant aneurysm. No acute dissection. Patent three-vessel arch anatomy. No mediastinal hemorrhage or hematoma. Heart size normal. Native coronary atherosclerosis. No pericardial effusion. Central venous structures are patent.  No veno-occlusive process. Mediastinum/Nodes: Stable dystrophic right thyroid calcification. Trachea and central airways are patent. Esophagus nondilated. No adenopathy. Lungs/Pleura: Severe diffuse emphysema pattern and hyperinflation. No acute airspace process, collapse consolidation. Minor basilar scarring. No superimposed edema pattern or CHF. No pleural abnormality, effusion, or pneumothorax. Upper Abdomen: Calcified gallstones noted. Extensive abdominal atherosclerosis. Punctate hepatic and splenic granulomata. Musculoskeletal: No acute osseous finding. Degenerative changes of the spine. No compression fracture. Motion artifact across the sternum. Review of the MIP images confirms the above findings. IMPRESSION: Negative for significant acute pulmonary embolus by CTA. No other acute intrathoracic finding. Native coronary atherosclerosis Cholelithiasis Aortic Atherosclerosis (ICD10-I70.0) and Emphysema (ICD10-J43.9). Electronically Signed   By: MJerilynn Mages  Shick M.D.   On: 06/13/2021 15:48   CT Cervical Spine Wo Contrast  Result Date: 06/13/2021 CLINICAL DATA:  Neck trauma EXAM: CT CERVICAL SPINE WITHOUT CONTRAST TECHNIQUE: Multidetector CT imaging of the cervical spine was performed without intravenous contrast. Multiplanar CT image reconstructions were also generated. COMPARISON:  None. FINDINGS: Alignment: Straightening and mild reversal of the normal cervical lordosis, centered on C5-C6, which may be positional. Skull base and vertebrae: No acute fracture. No  primary bone lesion or focal pathologic process. Soft tissues and spinal canal: No prevertebral fluid or swelling. No visible canal hematoma. Disc levels: Mild degenerative changes, without high-grade spinal canal stenosis or neural foraminal narrowing. Upper chest: For findings in the thorax, please see same day CT chest. Other: Calcified right thyroid nodule. IMPRESSION: No acute fracture or traumatic listhesis in the cervical spine. Electronically Signed   By: AMerilyn BabaM.D.   On: 06/13/2021 16:01   CT Abdomen Pelvis W Contrast  Result Date: 06/13/2021 CLINICAL DATA:  Nausea, vomiting, emesis, syncope, shortness of breath, fall EXAM: CT ABDOMEN AND PELVIS WITH CONTRAST TECHNIQUE: Multidetector CT imaging of the abdomen and pelvis was performed using the standard protocol following bolus administration of intravenous contrast. CONTRAST:  765mOMNIPAQUE IOHEXOL 350 MG/ML SOLN COMPARISON:  None. FINDINGS: Lower chest: No acute abnormality.  Coronary artery calcifications. Hepatobiliary: No solid liver abnormality is seen. Rim calcified gallstones in the gallbladder. Gallbladder wall thickening, or biliary dilatation. Pancreas: Unremarkable. No pancreatic ductal dilatation or surrounding inflammatory changes. Spleen: Normal in size without significant abnormality. Adrenals/Urinary Tract: Adrenal glands are unremarkable. Kidneys  are normal, without renal calculi, solid lesion, or hydronephrosis. Bladder is unremarkable. Stomach/Bowel: Stomach is within normal limits. Appendix appears normal. No evidence of bowel wall thickening, distention, or inflammatory changes. Sigmoid diverticula. Vascular/Lymphatic: Severe aortic atherosclerosis. No enlarged abdominal or pelvic lymph nodes. Reproductive: Status post hysterectomy. Other: No abdominal wall hernia or abnormality. No abdominopelvic ascites. Musculoskeletal: No acute or significant osseous findings. IMPRESSION: 1. No acute CT findings of the abdomen or pelvis  to explain nausea, vomiting, or emesis. 2. Cholelithiasis without evidence of acute cholecystitis. 3. Sigmoid diverticulosis without evidence of acute diverticulitis. Aortic Atherosclerosis (ICD10-I70.0). Electronically Signed   By: Eddie Candle M.D.   On: 06/13/2021 15:57   US Carotid Duplex Bilateral  Result Date: 05/23/2021 CLINICAL DATA:  Carotid atherosclerosis, visual disturbance, hyperlipidemia EXAM: BILATERAL CAROTID DUPLEX ULTRASOUND TECHNIQUE: Pearline Cables scale imaging, color Doppler and duplex ultrasound were performed of bilateral carotid and vertebral arteries in the neck. COMPARISON:  None. FINDINGS: Criteria: Quantification of carotid stenosis is based on velocity parameters that correlate the residual internal carotid diameter with NASCET-based stenosis levels, using the diameter of the distal internal carotid lumen as the denominator for stenosis measurement. The following velocity measurements were obtained: RIGHT ICA: 62/13 cm/sec CCA: [id number] cm/sec SYSTOLIC ICA/CCA RATIO:  0.9 ECA: 46 cm/sec LEFT ICA: 105/25 cm/sec CCA: [id number] cm/sec SYSTOLIC ICA/CCA RATIO:  1.9 ECA: 67 cm/sec RIGHT CAROTID ARTERY: Heterogeneous calcified bifurcation atherosclerosis. Despite this, no significant stenosis, velocity elevation, turbulent flow. Degree of narrowing less than 50% by ultrasound criteria. RIGHT VERTEBRAL ARTERY:  Normal antegrade flow LEFT CAROTID ARTERY: Similar heterogeneous calcified bifurcation atherosclerosis. Negative for significant stenosis, velocity elevation, turbulent flow. Degree of narrowing also less than 50% by ultrasound criteria. LEFT VERTEBRAL ARTERY:  Normal antegrade flow Upper extremity blood pressures: RIGHT: 148/55 LEFT: 127/46 IMPRESSION: Bilateral carotid atherosclerosis. Negative for significant stenosis. Degree of narrowing less than 50% bilaterally by ultrasound criteria. Patent antegrade vertebral flow bilaterally Electronically Signed   By: Jerilynn Mages.  Shick M.D.   On: 05/23/2021 12:33    DG Chest Port 1 View  Result Date: 06/17/2021 CLINICAL DATA:  Dyspnea EXAM: PORTABLE CHEST 1 VIEW COMPARISON:  June 13, 2021 FINDINGS: The heart size and mediastinal contours are within normal limits. Both lungs are clear. The lungs are hyperinflated. The visualized skeletal structures are unremarkable. IMPRESSION: No active cardiopulmonary disease. Emphysema. Electronically Signed   By: Abelardo Diesel M.D.   On: 06/17/2021 11:58   ECHOCARDIOGRAM COMPLETE  Result Date: 06/14/2021    ECHOCARDIOGRAM REPORT   Patient Name:   Margaret Henry Date of Exam: 06/14/2021 Medical Rec #:  [id number]      Height:       64.0 in Accession #:    [id number]     Weight:       117.9 lb Date of Birth:  1950-11-02      BSA:          1.563 m Patient Age:    15 years       BP:           122/104 mmHg Patient Gender: F              HR:           85 bpm. Exam Location:  Inpatient Procedure: 2D Echo, Cardiac Doppler and Color Doppler Indications:    R55 Syncope  History:        Patient has prior history of Echocardiogram examinations, most  recent 04/19/2018. COPD; Signs/Symptoms:Chest Pain and Dyspnea.  Sonographer:    [place] Referring Phys: [id number] [place]  1. Left ventricular ejection fraction, by estimation, is 60 to 65%. The left ventricle has normal function. The left ventricle has no regional wall motion abnormalities. Left ventricular diastolic parameters are consistent with Grade I diastolic dysfunction (impaired relaxation).  2. Right ventricular systolic function is normal. The right ventricular size is normal.  3. The mitral valve is normal in structure. No evidence of mitral valve regurgitation. No evidence of mitral stenosis.  4. The aortic valve was not well visualized. Aortic valve regurgitation is mild to moderate. Aortic regurgitation PHT measures 1055 msec. Aortic valve area, by VTI measures 3.24 cm. Aortic valve mean gradient measures 5.0 mmHg. Aortic valve Vmax measures 1.57 m/s.  5.  The inferior vena cava is normal in size with greater than 50% respiratory variability, suggesting right atrial pressure of 3 mmHg. FINDINGS  Left Ventricle: Left ventricular ejection fraction, by estimation, is 60 to 65%. The left ventricle has normal function. The left ventricle has no regional wall motion abnormalities. The left ventricular internal cavity size was normal in size. There is  no left ventricular hypertrophy. Left ventricular diastolic parameters are consistent with Grade I diastolic dysfunction (impaired relaxation). Normal left ventricular filling pressure. Right Ventricle: The right ventricular size is normal. No increase in right ventricular wall thickness. Right ventricular systolic function is normal. Left Atrium: Left atrial size was normal in size. Right Atrium: Right atrial size was normal in size. Pericardium: There is no evidence of pericardial effusion. Mitral Valve: The mitral valve is normal in structure. No evidence of mitral valve regurgitation. No evidence of mitral valve stenosis. Tricuspid Valve: The tricuspid valve is normal in structure. Tricuspid valve regurgitation is not demonstrated. No evidence of tricuspid stenosis. Aortic Valve: The aortic valve was not well visualized. Aortic valve regurgitation is mild to moderate. Aortic regurgitation PHT measures 1055 msec. Aortic valve mean gradient measures 5.0 mmHg. Aortic valve peak gradient measures 9.9 mmHg. Aortic valve area, by VTI measures 3.24 cm. Pulmonic Valve: The pulmonic valve was normal in structure. Pulmonic valve regurgitation is not visualized. No evidence of pulmonic stenosis. Aorta: The aortic root is normal in size and structure. Venous: The inferior vena cava is normal in size with greater than 50% respiratory variability, suggesting right atrial pressure of 3 mmHg. IAS/Shunts: The interatrial septum appears to be lipomatous. No atrial level shunt detected by color flow Doppler.  LEFT VENTRICLE PLAX 2D LVIDd:          3.70 cm     Diastology LVIDs:         2.50 cm     LV e' medial:    5.55 cm/s LV PW:         1.10 cm     LV E/e' medial:  15.4 LV IVS:        1.00 cm     LV e' lateral:   7.94 cm/s LVOT diam:     2.30 cm     LV E/e' lateral: 10.7 LV SV:         98 LV SV Index:   63 LVOT Area:     4.15 cm  LV Volumes (MOD) LV vol d, MOD A4C: 29.4 ml LV vol s, MOD A4C: 9.6 ml LV SV MOD A4C:     29.4 ml RIGHT VENTRICLE             IVC RV S prime:  10.90 cm/s  IVC diam: 1.90 cm TAPSE (M-mode): 2.7 cm LEFT ATRIUM           Index       RIGHT ATRIUM          Index LA diam:      2.80 cm 1.79 cm/m  RA Area:     7.73 cm LA Vol (A2C): 19.5 ml 12.48 ml/m RA Volume:   14.00 ml 8.96 ml/m LA Vol (A4C): 4.5 ml  2.86 ml/m  AORTIC VALVE                    PULMONIC VALVE AV Area (Vmax):    3.65 cm     PV Vmax:       0.66 m/s AV Area (Vmean):   2.86 cm     PV Peak grad:  1.7 mmHg AV Area (VTI):     3.24 cm AV Vmax:           157.00 cm/s AV Vmean:          112.000 cm/s AV VTI:            0.303 m AV Peak Grad:      9.9 mmHg AV Mean Grad:      5.0 mmHg LVOT Vmax:         138.00 cm/s LVOT Vmean:        77.000 cm/s LVOT VTI:          0.236 m LVOT/AV VTI ratio: 0.78 AI PHT:            1055 msec  AORTA Ao Root diam: 3.20 cm Ao Asc diam:  2.90 cm MITRAL VALVE                TRICUSPID VALVE MV Area (PHT): 3.67 cm     TR Peak grad:   7.6 mmHg MV E velocity: 85.20 cm/s   TR Vmax:        138.00 cm/s MV A velocity: 101.00 cm/s MV E/A ratio:  0.84         SHUNTS                             Systemic VTI:  0.24 m                             Systemic Diam: 2.30 cm Fransico Him MD Electronically signed by Fransico Him MD Signature Date/Time: 06/14/2021/9:24:22 AM    Final      Subjective:  Reports overall generalized weakness, dyspnea at baseline, she does report some intermittent cough.  Discharge Exam: Vitals:   06/18/21 0428 06/18/21 0813  BP: 130/66 130/76  Pulse: 80 91  Resp: 14 15  Temp: 98 F (36.7 C) 98.2 F (36.8 C)  SpO2: 96%  94%   Vitals:   06/17/21 1720 06/17/21 2354 06/18/21 0428 06/18/21 0813  BP: (!) 152/66 129/74 130/66 130/76  Pulse: 87 87 80 91  Resp: '18 13 14 15  '$ Temp: 98.2 F (36.8 C) 98.2 F (36.8 C) 98 F (36.7 C) 98.2 F (36.8 C)  TempSrc: Oral Oral Oral Oral  SpO2: 96% 95% 96% 94%  Weight:      Height:        General: Pt is alert, awake, not in acute distress, frai Cardiovascular: RRR, S1/S2 +, no rubs, no gallops Respiratory: Has good air entry today, no  wheezing, no rhonchi. Abdominal: Soft, NT, ND, bowel sounds + Extremities: no edema, no cyanosis    The results of significant diagnostics from this hospitalization (including imaging, microbiology, ancillary and laboratory) are listed below for reference.     Microbiology: Recent Results (from the past 240 hour(s))  Resp Panel by RT-PCR (Flu A&B, Covid) Nasopharyngeal Swab     Status: Abnormal   Collection Time: 06/13/21 12:40 PM   Specimen: Nasopharyngeal Swab; Nasopharyngeal(NP) swabs in vial transport medium  Result Value Ref Range Status   SARS Coronavirus 2 by RT PCR POSITIVE (A) NEGATIVE Final    Comment: RESULT CALLED TO, READ BACK BY AND VERIFIED WITH: RN C.CHAPMAN AT [id number] ON 06/13/2021 BY T.SAAD. (NOTE) SARS-CoV-2 target nucleic acids are DETECTED.  The SARS-CoV-2 RNA is generally detectable in upper respiratory specimens during the acute phase of infection. Positive results are indicative of the presence of the identified virus, but do not rule out bacterial infection or co-infection with other pathogens not detected by the test. Clinical correlation with patient history and other diagnostic information is necessary to determine patient infection status. The expected result is Negative.  Fact Sheet for Patients: EntrepreneurPulse.com.au  Fact Sheet for Healthcare Providers: IncredibleEmployment.be  This test is not yet approved or cleared by the [place] FDA and  has  been authorized for detection and/or diagnosis of SARS-CoV-2 by FDA under an Emergency Use Authorization (EUA).  This EUA will remain in effect (meaning this  test can be used) for the duration of  the COVID-19 declaration under Section 564(b)(1) of the Act, 21 U.S.C. section 360bbb-3(b)(1), unless the authorization is terminated or revoked sooner.     Influenza A by PCR NEGATIVE NEGATIVE Final   Influenza B by PCR NEGATIVE NEGATIVE Final    Comment: (NOTE) The Xpert Xpress SARS-CoV-2/FLU/RSV plus assay is intended as an aid in the diagnosis of influenza from Nasopharyngeal swab specimens and should not be used as a sole basis for treatment. Nasal washings and aspirates are unacceptable for Xpert Xpress SARS-CoV-2/FLU/RSV testing.  Fact Sheet for Patients: EntrepreneurPulse.com.au  Fact Sheet for Healthcare Providers: IncredibleEmployment.be  This test is not yet approved or cleared by the [place] FDA and has been authorized for detection and/or diagnosis of SARS-CoV-2 by FDA under an Emergency Use Authorization (EUA). This EUA will remain in effect (meaning this test can be used) for the duration of the COVID-19 declaration under Section 564(b)(1) of the Act, 21 U.S.C. section 360bbb-3(b)(1), unless the authorization is terminated or revoked.  Performed at [facility], [place] [address], [place], [place] [address]   Blood culture (routine x 2)     Status: None (Preliminary result)   Collection Time: 06/13/21 12:41 PM   Specimen: BLOOD  Result Value Ref Range Status   Specimen Description BLOOD RIGHT ANTECUBITAL  Final   Special Requests   Final    BOTTLES DRAWN AEROBIC AND ANAEROBIC Blood Culture results may not be optimal due to an inadequate volume of blood received in culture bottles   Culture   Final    NO GROWTH 4 DAYS Performed at [facility], [place] [address], [place], [place] [address]    Report Status PENDING   Incomplete  Blood culture (routine x 2)     Status: Abnormal   Collection Time: 06/13/21 12:41 PM   Specimen: BLOOD LEFT FOREARM  Result Value Ref Range Status   Specimen Description BLOOD LEFT FOREARM  Final   Special Requests   Final  BOTTLES DRAWN AEROBIC ONLY Blood Culture adequate volume   Culture  Setup Time   Final    GRAM POSITIVE COCCI AEROBIC BOTTLE ONLY CRITICAL RESULT CALLED TO, READ BACK BY AND VERIFIED WITH: PHARMD A LAWLESS [id number] AT 53 AM B Y CM    Culture (A)  Final    STAPHYLOCOCCUS HOMINIS THE SIGNIFICANCE OF ISOLATING THIS ORGANISM FROM A SINGLE SET OF BLOOD CULTURES WHEN MULTIPLE SETS ARE DRAWN IS UNCERTAIN. PLEASE NOTIFY THE MICROBIOLOGY DEPARTMENT WITHIN ONE WEEK IF SPECIATION AND SENSITIVITIES ARE REQUIRED. Performed at [facility], [place] [address] [place], [place] [address]    Report Status 06/16/2021 FINAL  Final  Blood Culture ID Panel (Reflexed)     Status: Abnormal   Collection Time: 06/13/21 12:41 PM  Result Value Ref Range Status   Enterococcus faecalis NOT DETECTED NOT DETECTED Final   Enterococcus Faecium NOT DETECTED NOT DETECTED Final   Listeria monocytogenes NOT DETECTED NOT DETECTED Final   Staphylococcus species DETECTED (A) NOT DETECTED Final    Comment: CRITICAL RESULT CALLED TO, READ BACK BY AND VERIFIED WITH: PHARMD A LAWLESS [id number] AT 855 AM BY CM    Staphylococcus aureus (BCID) NOT DETECTED NOT DETECTED Final   Staphylococcus epidermidis NOT DETECTED NOT DETECTED Final   Staphylococcus lugdunensis NOT DETECTED NOT DETECTED Final   Streptococcus species NOT DETECTED NOT DETECTED Final   Streptococcus agalactiae NOT DETECTED NOT DETECTED Final   Streptococcus pneumoniae NOT DETECTED NOT DETECTED Final   Streptococcus pyogenes NOT DETECTED NOT DETECTED Final   A.calcoaceticus-baumannii NOT DETECTED NOT DETECTED Final   Bacteroides fragilis NOT DETECTED NOT DETECTED Final   Enterobacterales NOT DETECTED NOT DETECTED Final    Enterobacter cloacae complex NOT DETECTED NOT DETECTED Final   Escherichia coli NOT DETECTED NOT DETECTED Final   Klebsiella aerogenes NOT DETECTED NOT DETECTED Final   Klebsiella oxytoca NOT DETECTED NOT DETECTED Final   Klebsiella pneumoniae NOT DETECTED NOT DETECTED Final   Proteus species NOT DETECTED NOT DETECTED Final   Salmonella species NOT DETECTED NOT DETECTED Final   Serratia marcescens NOT DETECTED NOT DETECTED Final   Haemophilus influenzae NOT DETECTED NOT DETECTED Final   Neisseria meningitidis NOT DETECTED NOT DETECTED Final   Pseudomonas aeruginosa NOT DETECTED NOT DETECTED Final   Stenotrophomonas maltophilia NOT DETECTED NOT DETECTED Final   Candida albicans NOT DETECTED NOT DETECTED Final   Candida auris NOT DETECTED NOT DETECTED Final   Candida glabrata NOT DETECTED NOT DETECTED Final   Candida krusei NOT DETECTED NOT DETECTED Final   Candida parapsilosis NOT DETECTED NOT DETECTED Final   Candida tropicalis NOT DETECTED NOT DETECTED Final   Cryptococcus neoformans/gattii NOT DETECTED NOT DETECTED Final    Comment: Performed at [facility], [place] [address], [place], [place] [address]  Group A Strep by PCR     Status: None   Collection Time: 06/13/21 12:56 PM   Specimen: Throat; Sterile Swab  Result Value Ref Range Status   Group A Strep by PCR NOT DETECTED NOT DETECTED Final    Comment: Performed at [facility], [place] [address] [place], [place] [address]     Labs: BNP (last 3 results) Recent Labs    06/13/21 1241  BNP 9.9   Basic Metabolic Panel: Recent Labs  Lab 06/14/21 0058 06/15/21 0220 06/16/21 0353 06/17/21 0110 06/18/21 0208  NA 136 136 138 137 139  K 3.9 4.3 4.4 4.6 4.1  CL 107 105 104 104 104  CO2 22  $'25 26 26 28  'B$ GLUCOSE 150* 124* 125* 114* 107*  BUN '15 18 19 21 16  '$ CREATININE 0.71 0.60 0.54 0.64 0.55  CALCIUM 9.1 9.3 9.4 9.8 9.1  MG 2.2 2.0 2.2 2.2 2.2  PHOS 4.3 3.5 3.3 3.5 3.5   Liver Function Tests: Recent Labs  Lab  06/14/21 0058 06/15/21 0220 06/16/21 0353 06/17/21 0110 06/18/21 0208  AST '22 21 19 23 18  '$ ALT '11 11 13 15 14  '$ ALKPHOS 56 44 43 46 35*  BILITOT 0.4 0.4 0.3 0.7 0.6  PROT 6.4* 5.7* 5.5* 6.6 5.5*  ALBUMIN 3.3* 3.0* 2.9* 3.6 3.2*   Recent Labs  Lab 06/13/21 1240  LIPASE 44   No results for input(s): AMMONIA in the last 168 hours. CBC: Recent Labs  Lab 06/14/21 0058 06/15/21 0220 06/16/21 0353 06/17/21 0110 06/18/21 0208  WBC 5.2 7.0 7.0 9.0 7.6  NEUTROABS 4.4 5.8 5.9 6.6 5.8  HGB 9.6* 8.7* 8.7* 10.2* 9.1*  HCT 30.6* 27.9* 27.9* 33.0* 29.3*  MCV 80.7 81.3 80.6 80.1 80.5  PLT 310 263 247 290 270   Cardiac Enzymes: No results for input(s): CKTOTAL, CKMB, CKMBINDEX, TROPONINI in the last 168 hours. BNP: Invalid input(s): POCBNP CBG: No results for input(s): GLUCAP in the last 168 hours. D-Dimer Recent Labs    06/17/21 0110 06/18/21 0208  DDIMER 0.72* 0.44   Hgb A1c No results for input(s): HGBA1C in the last 72 hours. Lipid Profile No results for input(s): CHOL, HDL, LDLCALC, TRIG, CHOLHDL, LDLDIRECT in the last 72 hours. Thyroid function studies No results for input(s): TSH, T4TOTAL, T3FREE, THYROIDAB in the last 72 hours.  Invalid input(s): FREET3 Anemia work up Recent Labs    06/17/21 0110 06/18/21 0208  FERRITIN 43 44   Urinalysis    Component Value Date/Time   COLORURINE YELLOW 06/15/2021 1030   APPEARANCEUR HAZY (A) 06/15/2021 1030   LABSPEC 1.018 06/15/2021 1030   PHURINE 6.0 06/15/2021 1030   GLUCOSEU NEGATIVE 06/15/2021 1030   [place] 06/15/2021 1030   HGBUR trace-lysed 01/21/2009 0954   BILIRUBINUR NEGATIVE 06/15/2021 1030   KETONESUR NEGATIVE 06/15/2021 1030   PROTEINUR NEGATIVE 06/15/2021 1030   UROBILINOGEN 0.2 01/21/2009 0954   NITRITE NEGATIVE 06/15/2021 1030   LEUKOCYTESUR LARGE (A) 06/15/2021 1030   Sepsis Labs Invalid input(s): PROCALCITONIN,  WBC,  LACTICIDVEN Microbiology Recent Results (from the past 240 hour(s))   Resp Panel by RT-PCR (Flu A&B, Covid) Nasopharyngeal Swab     Status: Abnormal   Collection Time: 06/13/21 12:40 PM   Specimen: Nasopharyngeal Swab; Nasopharyngeal(NP) swabs in vial transport medium  Result Value Ref Range Status   SARS Coronavirus 2 by RT PCR POSITIVE (A) NEGATIVE Final    Comment: RESULT CALLED TO, READ BACK BY AND VERIFIED WITH: RN C.CHAPMAN AT [id number] ON 06/13/2021 BY T.SAAD. (NOTE) SARS-CoV-2 target nucleic acids are DETECTED.  The SARS-CoV-2 RNA is generally detectable in upper respiratory specimens during the acute phase of infection. Positive results are indicative of the presence of the identified virus, but do not rule out bacterial infection or co-infection with other pathogens not detected by the test. Clinical correlation with patient history and other diagnostic information is necessary to determine patient infection status. The expected result is Negative.  Fact Sheet for Patients: EntrepreneurPulse.com.au  Fact Sheet for Healthcare Providers: IncredibleEmployment.be  This test is not yet approved or cleared by the [place] FDA and  has been authorized for detection and/or diagnosis of SARS-CoV-2 by FDA under an Emergency Use  Authorization (EUA).  This EUA will remain in effect (meaning this  test can be used) for the duration of  the COVID-19 declaration under Section 564(b)(1) of the Act, 21 U.S.C. section 360bbb-3(b)(1), unless the authorization is terminated or revoked sooner.     Influenza A by PCR NEGATIVE NEGATIVE Final   Influenza B by PCR NEGATIVE NEGATIVE Final    Comment: (NOTE) The Xpert Xpress SARS-CoV-2/FLU/RSV plus assay is intended as an aid in the diagnosis of influenza from Nasopharyngeal swab specimens and should not be used as a sole basis for treatment. Nasal washings and aspirates are unacceptable for Xpert Xpress SARS-CoV-2/FLU/RSV testing.  Fact Sheet for  Patients: EntrepreneurPulse.com.au  Fact Sheet for Healthcare Providers: IncredibleEmployment.be  This test is not yet approved or cleared by the [place] FDA and has been authorized for detection and/or diagnosis of SARS-CoV-2 by FDA under an Emergency Use Authorization (EUA). This EUA will remain in effect (meaning this test can be used) for the duration of the COVID-19 declaration under Section 564(b)(1) of the Act, 21 U.S.C. section 360bbb-3(b)(1), unless the authorization is terminated or revoked.  Performed at [facility], [place] [address] [place], [place] [address]   Blood culture (routine x 2)     Status: None (Preliminary result)   Collection Time: 06/13/21 12:41 PM   Specimen: BLOOD  Result Value Ref Range Status   Specimen Description BLOOD RIGHT ANTECUBITAL  Final   Special Requests   Final    BOTTLES DRAWN AEROBIC AND ANAEROBIC Blood Culture results may not be optimal due to an inadequate volume of blood received in culture bottles   Culture   Final    NO GROWTH 4 DAYS Performed at [facility], [place] [address] [place], [place] [address]    Report Status PENDING  Incomplete  Blood culture (routine x 2)     Status: Abnormal   Collection Time: 06/13/21 12:41 PM   Specimen: BLOOD LEFT FOREARM  Result Value Ref Range Status   Specimen Description BLOOD LEFT FOREARM  Final   Special Requests   Final    BOTTLES DRAWN AEROBIC ONLY Blood Culture adequate volume   Culture  Setup Time   Final    GRAM POSITIVE COCCI AEROBIC BOTTLE ONLY CRITICAL RESULT CALLED TO, READ BACK BY AND VERIFIED WITH: PHARMD A LAWLESS [id number] AT 54 AM B Y CM    Culture (A)  Final    STAPHYLOCOCCUS HOMINIS THE SIGNIFICANCE OF ISOLATING THIS ORGANISM FROM A SINGLE SET OF BLOOD CULTURES WHEN MULTIPLE SETS ARE DRAWN IS UNCERTAIN. PLEASE NOTIFY THE MICROBIOLOGY DEPARTMENT WITHIN ONE WEEK IF SPECIATION AND SENSITIVITIES ARE REQUIRED. Performed at  [facility], [place] [address] [place], [place] [address]    Report Status 06/16/2021 FINAL  Final  Blood Culture ID Panel (Reflexed)     Status: Abnormal   Collection Time: 06/13/21 12:41 PM  Result Value Ref Range Status   Enterococcus faecalis NOT DETECTED NOT DETECTED Final   Enterococcus Faecium NOT DETECTED NOT DETECTED Final   Listeria monocytogenes NOT DETECTED NOT DETECTED Final   Staphylococcus species DETECTED (A) NOT DETECTED Final    Comment: CRITICAL RESULT CALLED TO, READ BACK BY AND VERIFIED WITH: PHARMD A LAWLESS [id number] AT 855 AM BY CM    Staphylococcus aureus (BCID) NOT DETECTED NOT DETECTED Final   Staphylococcus epidermidis NOT DETECTED NOT DETECTED Final   Staphylococcus lugdunensis NOT DETECTED NOT DETECTED Final   Streptococcus species NOT DETECTED NOT DETECTED Final  Streptococcus agalactiae NOT DETECTED NOT DETECTED Final   Streptococcus pneumoniae NOT DETECTED NOT DETECTED Final   Streptococcus pyogenes NOT DETECTED NOT DETECTED Final   A.calcoaceticus-baumannii NOT DETECTED NOT DETECTED Final   Bacteroides fragilis NOT DETECTED NOT DETECTED Final   Enterobacterales NOT DETECTED NOT DETECTED Final   Enterobacter cloacae complex NOT DETECTED NOT DETECTED Final   Escherichia coli NOT DETECTED NOT DETECTED Final   Klebsiella aerogenes NOT DETECTED NOT DETECTED Final   Klebsiella oxytoca NOT DETECTED NOT DETECTED Final   Klebsiella pneumoniae NOT DETECTED NOT DETECTED Final   Proteus species NOT DETECTED NOT DETECTED Final   Salmonella species NOT DETECTED NOT DETECTED Final   Serratia marcescens NOT DETECTED NOT DETECTED Final   Haemophilus influenzae NOT DETECTED NOT DETECTED Final   Neisseria meningitidis NOT DETECTED NOT DETECTED Final   Pseudomonas aeruginosa NOT DETECTED NOT DETECTED Final   Stenotrophomonas maltophilia NOT DETECTED NOT DETECTED Final   Candida albicans NOT DETECTED NOT DETECTED Final   Candida auris NOT DETECTED NOT DETECTED  Final   Candida glabrata NOT DETECTED NOT DETECTED Final   Candida krusei NOT DETECTED NOT DETECTED Final   Candida parapsilosis NOT DETECTED NOT DETECTED Final   Candida tropicalis NOT DETECTED NOT DETECTED Final   Cryptococcus neoformans/gattii NOT DETECTED NOT DETECTED Final    Comment: Performed at [facility], [address], [place], [place] [address]  Group A Strep by PCR     Status: None   Collection Time: 06/13/21 12:56 PM   Specimen: Throat; Sterile Swab  Result Value Ref Range Status   Group A Strep by PCR NOT DETECTED NOT DETECTED Final    Comment: Performed at [facility], [place] [address], [place], [place] [address]     Time coordinating discharge: Over 30 minutes  SIGNED:   Phillips Climes, MD  [facility] 06/18/2021, 11:47 AM Pager   If 7PM-7AM, please contact night-coverage [email or web address] Password [email or web address]

## 2021-06-19 ENCOUNTER — Other Ambulatory Visit (HOSPITAL_BASED_OUTPATIENT_CLINIC_OR_DEPARTMENT_OTHER): Payer: Self-pay

## 2021-06-19 ENCOUNTER — Other Ambulatory Visit: Payer: Self-pay

## 2021-06-19 ENCOUNTER — Telehealth: Payer: Self-pay

## 2021-06-19 NOTE — Telephone Encounter (Signed)
From the discharge call:  She said she is not feeling very good - her balance is off, her BP and heart rate have been elevated. She also noted that she has no appetite and is weak in her knees- her son needs to help her walk into the bathroom. She has completed her 5 days of quarantine.   She said she has all of her medications and did not have any questions about her med regime.  She already has a nebulizer, shower chair  and O2.  She said she is now using the O2 @ 3L.   she said she will call to schedule an appointment with Dr Margarita Rana

## 2021-06-19 NOTE — Patient Outreach (Signed)
[facility]) Care Management  06/19/2021  SHACOLA CYPHER 03-21-1951 [id number]   Referral Date: 06/19/21 Referral Source: [employer] Report Date of Discharge: 06/18/21 Facility:  [place]: [facility]   Referral received.  No outreach warranted at this time.  Transition of Care calls being completed via EMMI. RN CM will outreach patient for any red flags received.    Plan: RN CM will close case.    Jone Baseman, RN, MSN [facility] Care Management Care Management Coordinator Direct Line [phone] Toll Free: [phone]  Fax: [phone]

## 2021-06-19 NOTE — Telephone Encounter (Signed)
Transition Care Management Follow-up Telephone Call Date of discharge and from where: 06/18/2021, [facility]  How have you been since you were released from the hospital? She said she is not feeling very good - her balance is off, her BP and heart rate have been elevated. She also noted that she has no appetite and is weak in her knees- her son needs to help her walk into the bathroom. She has completed her 5 days of quarantine.  Any questions or concerns? Yes - noted above.   Items Reviewed: Did the pt receive and understand the discharge instructions provided? Yes  Medications obtained and verified? Yes  - she said she has everything.  She did not have any questions about her medication regime  Other? No  Any new allergies since your discharge? No  Dietary orders reviewed? No Do you have support at home? Yes  - her son.   Home Care and Equipment/Supplies: Were home health services ordered? yes If so, what is the name of the agency? [facility]  Has the agency set up a time to come to the patient's home? no Were any new equipment or medical supplies ordered?  Yes: RW What is the name of the medical supply agency? [facility] Were you able to get the supplies/equipment? No - she is waiting for them to be delivered.  Do you have any questions related to the use of the equipment or supplies? No  She already has a nebulizer, shower chair  and O2.  She said she is now using the O2 @ 3L.   Functional Questionnaire: (I = Independent and D = Dependent) ADLs: needs assistance with ADLs.    Follow up appointments reviewed:  PCP Hospital f/u appt confirmed? No  - she said she will call to schedule an appointment  Specialist Hospital f/u appt confirmed?  None scheduled at this time.  Are transportation arrangements needed? No  If their condition worsens, is the pt aware to call PCP or go to the Emergency Dept.? Yes Was the patient provided with contact information for the PCP's office  or ED? Yes Was to pt encouraged to call back with questions or concerns? Yes

## 2021-06-22 ENCOUNTER — Telehealth: Payer: Self-pay | Admitting: Family Medicine

## 2021-06-22 DIAGNOSIS — D649 Anemia, unspecified: Secondary | ICD-10-CM | POA: Diagnosis not present

## 2021-06-22 DIAGNOSIS — I700 Atherosclerosis of aorta: Secondary | ICD-10-CM | POA: Diagnosis not present

## 2021-06-22 DIAGNOSIS — U071 COVID-19: Secondary | ICD-10-CM | POA: Diagnosis not present

## 2021-06-22 DIAGNOSIS — E785 Hyperlipidemia, unspecified: Secondary | ICD-10-CM | POA: Diagnosis not present

## 2021-06-22 DIAGNOSIS — J441 Chronic obstructive pulmonary disease with (acute) exacerbation: Secondary | ICD-10-CM | POA: Diagnosis not present

## 2021-06-22 DIAGNOSIS — K219 Gastro-esophageal reflux disease without esophagitis: Secondary | ICD-10-CM | POA: Diagnosis not present

## 2021-06-22 DIAGNOSIS — I350 Nonrheumatic aortic (valve) stenosis: Secondary | ICD-10-CM | POA: Diagnosis not present

## 2021-06-22 DIAGNOSIS — M810 Age-related osteoporosis without current pathological fracture: Secondary | ICD-10-CM | POA: Diagnosis not present

## 2021-06-22 DIAGNOSIS — J9621 Acute and chronic respiratory failure with hypoxia: Secondary | ICD-10-CM | POA: Diagnosis not present

## 2021-06-22 NOTE — Telephone Encounter (Unsigned)
Copied from [place] [phone]. Topic: Quick Communication - Home Health Verbal Orders >> Jun 22, 2021  4:30 PM Yvette Rack wrote: Caller/Agency: Annabelle Harman with [employer] Number: [id number] Requesting OT/PT/Skilled Nursing/Social Work/Speech Therapy: PT Frequency: 1x1, 2x4, and 1x4

## 2021-06-23 NOTE — Telephone Encounter (Signed)
Verbal order were left on VM.

## 2021-06-27 ENCOUNTER — Telehealth: Payer: Self-pay | Admitting: Family Medicine

## 2021-06-27 ENCOUNTER — Other Ambulatory Visit (HOSPITAL_BASED_OUTPATIENT_CLINIC_OR_DEPARTMENT_OTHER): Payer: Self-pay

## 2021-06-27 NOTE — Telephone Encounter (Signed)
Patient has made an additional call regarding this matter  Patient has stressed the urgency of the matter and would like her son to be able to pick up the prescription, if possible, today before 6 PM  Please contact patient further when available

## 2021-06-27 NOTE — Telephone Encounter (Signed)
Copied from [place] [phone]. Topic: General - Other >> Jun 26, 2021  4:24 PM Antonieta Iba C wrote: Reason for CRM: pt called In to request a Rx for a antibiotic. Pt says that she was in the hospital for 5 days. Pt says that she has developed a cough .   Pharmacy: [facility]  Phone:  [phone] Fax:  [phone] - >> Jun 27, 2021  1:32 PM Leward Quan A wrote: Patient called in to inform Dr Margarita Rana that she have been home from the hospital for 5 days and is still not feeling any better. Say that she have had pneumonia twice and know what it feels like and need the antibiotic please ASAP. Waiting on a call back at Ph# [phone]

## 2021-06-28 ENCOUNTER — Other Ambulatory Visit: Payer: Self-pay

## 2021-06-28 ENCOUNTER — Telehealth (INDEPENDENT_AMBULATORY_CARE_PROVIDER_SITE_OTHER): Payer: Medicare HMO | Admitting: Nurse Practitioner

## 2021-06-28 ENCOUNTER — Other Ambulatory Visit (HOSPITAL_BASED_OUTPATIENT_CLINIC_OR_DEPARTMENT_OTHER): Payer: Self-pay

## 2021-06-28 ENCOUNTER — Ambulatory Visit: Payer: Self-pay

## 2021-06-28 DIAGNOSIS — R059 Cough, unspecified: Secondary | ICD-10-CM

## 2021-06-28 DIAGNOSIS — Z8616 Personal history of COVID-19: Secondary | ICD-10-CM

## 2021-06-28 DIAGNOSIS — U071 COVID-19: Secondary | ICD-10-CM | POA: Diagnosis not present

## 2021-06-28 MED ORDER — AMOXICILLIN-POT CLAVULANATE 875-125 MG PO TABS
1.0000 | ORAL_TABLET | Freq: Two times a day (BID) | ORAL | 0 refills | Status: DC
  Filled 2021-06-28: qty 20, 10d supply, fill #0

## 2021-06-28 NOTE — Telephone Encounter (Signed)
Pt. Reports she started coughing 2 weeks ago. Productive with gray mucus. Has mild shortness of breath and wheezing. Asking for antibiotic. No answer on FC line. Please advise pt. If virtual visit can be done today.    Answer Assessment - Initial Assessment Questions 1. ONSET: "When did the cough begin?"      2 weeks ago 2. SEVERITY: "How bad is the cough today?"      Severe 3. SPUTUM: "Describe the color of your sputum" (none, dry cough; clear, white, yellow, green)     Gray 4. HEMOPTYSIS: "Are you coughing up any blood?" If so ask: "How much?" (flecks, streaks, tablespoons, etc.)     No 5. DIFFICULTY BREATHING: "Are you having difficulty breathing?" If Yes, ask: "How bad is it?" (e.g., mild, moderate, severe)    - MILD: No SOB at rest, mild SOB with walking, speaks normally in sentences, can lie down, no retractions, pulse < 100.    - MODERATE: SOB at rest, SOB with minimal exertion and prefers to sit, cannot lie down flat, speaks in phrases, mild retractions, audible wheezing, pulse 100-120.    - SEVERE: Very SOB at rest, speaks in single words, struggling to breathe, sitting hunched forward, retractions, pulse > 120      Mild 6. FEVER: "Do you have a fever?" If Yes, ask: "What is your temperature, how was it measured, and when did it start?"     No 7. CARDIAC HISTORY: "Do you have any history of heart disease?" (e.g., heart attack, congestive heart failure)      No 8. LUNG HISTORY: "Do you have any history of lung disease?"  (e.g., pulmonary embolus, asthma, emphysema)     Pneumonia in the past 9. PE RISK FACTORS: "Do you have a history of blood clots?" (or: recent major surgery, recent prolonged travel, bedridden)     No 10. OTHER SYMPTOMS: "Do you have any other symptoms?" (e.g., runny nose, wheezing, chest pain)       Wheezing 11. PREGNANCY: "Is there any chance you are pregnant?" "When was your last menstrual period?"       No 12. TRAVEL: "Have you traveled out of the country in  the last month?" (e.g., travel history, exposures)       No  Protocols used: Cough - Acute Productive-A-AH

## 2021-06-28 NOTE — Telephone Encounter (Signed)
Scheduled an televisit appointment with patient regarding concerns.

## 2021-06-28 NOTE — Patient Instructions (Addendum)
Covid 19 Cough:   Stay well hydrated  Stay active  Deep breathing exercises  May take tylenol or fever or pain  May take mucinex twice daily  Will order Augmentin  Will order chest xray at [facility]     Follow up:  Follow up if needed - if symptoms worsen please go to the ED

## 2021-06-28 NOTE — Telephone Encounter (Signed)
Appt scheduled with [facility]- with NP Nils Pyle.

## 2021-06-28 NOTE — Progress Notes (Signed)
Virtual Visit via Telephone Note  I connected with Margaret Henry on 06/28/21 at 10:45 AM EDT by telephone and verified that I am speaking with the correct person using two identifiers.  Location: Patient: home Provider: office   I discussed the limitations, risks, security and privacy concerns of performing an evaluation and management service by telephone and the availability of in person appointments. I also discussed with the patient that there may be a patient responsible charge related to this service. The patient expressed understanding and agreed to proceed.   History of Present Illness:  Patient presents today for an acute visit through televisit.  Patient states that she has been having cough and chest congestion.  She is concerned for developing pneumonia.  She states that she has had issues with this in the past.  We discussed that we can order a chest x-ray at [facility].  Patient has issues with transportation so we will go ahead and send in an antibiotic for her to start on and hopefully she can get her x-ray completed tomorrow. Denies f/c/s, n/v/d, hemoptysis, PND, chest pain or edema.     Observations/Objective:  Vitals with BMI 06/18/2021 06/18/2021 06/18/2021  Height - - -  Weight - - -  BMI - - -  Systolic [phone]  Diastolic 76 76 66  Pulse 99 91 80      Assessment and Plan:  Covid 19 Cough:   Stay well hydrated  Stay active  Deep breathing exercises  May take tylenol or fever or pain  May take mucinex twice daily  Will order Augmentin  Will order chest xray at [facility]     Follow up:  Follow up if needed - if symptoms worsen please go to the ED     I discussed the assessment and treatment plan with the patient. The patient was provided an opportunity to ask questions and all were answered. The patient agreed with the plan and demonstrated an understanding of the instructions.   The patient was advised to call  back or seek an in-person evaluation if the symptoms worsen or if the condition fails to improve as anticipated.  I provided 23 minutes of non-face-to-face time during this encounter.   Fenton Foy, NP

## 2021-06-30 ENCOUNTER — Ambulatory Visit (HOSPITAL_BASED_OUTPATIENT_CLINIC_OR_DEPARTMENT_OTHER)
Admission: RE | Admit: 2021-06-30 | Discharge: 2021-06-30 | Disposition: A | Discharge status: Home / Self Care | Payer: Medicare HMO | Source: Ambulatory Visit | Attending: Nurse Practitioner | Admitting: Nurse Practitioner

## 2021-06-30 ENCOUNTER — Other Ambulatory Visit: Payer: Self-pay

## 2021-06-30 DIAGNOSIS — J9621 Acute and chronic respiratory failure with hypoxia: Secondary | ICD-10-CM | POA: Diagnosis not present

## 2021-06-30 DIAGNOSIS — R059 Cough, unspecified: Secondary | ICD-10-CM | POA: Insufficient documentation

## 2021-06-30 DIAGNOSIS — J441 Chronic obstructive pulmonary disease with (acute) exacerbation: Secondary | ICD-10-CM | POA: Diagnosis not present

## 2021-06-30 DIAGNOSIS — D649 Anemia, unspecified: Secondary | ICD-10-CM | POA: Diagnosis not present

## 2021-06-30 DIAGNOSIS — M810 Age-related osteoporosis without current pathological fracture: Secondary | ICD-10-CM | POA: Diagnosis not present

## 2021-06-30 DIAGNOSIS — E785 Hyperlipidemia, unspecified: Secondary | ICD-10-CM | POA: Diagnosis not present

## 2021-06-30 DIAGNOSIS — I350 Nonrheumatic aortic (valve) stenosis: Secondary | ICD-10-CM | POA: Diagnosis not present

## 2021-06-30 DIAGNOSIS — U071 COVID-19: Secondary | ICD-10-CM | POA: Diagnosis not present

## 2021-06-30 DIAGNOSIS — I700 Atherosclerosis of aorta: Secondary | ICD-10-CM | POA: Diagnosis not present

## 2021-06-30 DIAGNOSIS — Z8616 Personal history of COVID-19: Secondary | ICD-10-CM | POA: Diagnosis not present

## 2021-06-30 DIAGNOSIS — J90 Pleural effusion, not elsewhere classified: Secondary | ICD-10-CM | POA: Diagnosis not present

## 2021-06-30 DIAGNOSIS — J439 Emphysema, unspecified: Secondary | ICD-10-CM | POA: Diagnosis not present

## 2021-06-30 DIAGNOSIS — K219 Gastro-esophageal reflux disease without esophagitis: Secondary | ICD-10-CM | POA: Diagnosis not present

## 2021-06-30 DIAGNOSIS — J189 Pneumonia, unspecified organism: Secondary | ICD-10-CM | POA: Diagnosis not present

## 2021-07-04 ENCOUNTER — Other Ambulatory Visit (HOSPITAL_BASED_OUTPATIENT_CLINIC_OR_DEPARTMENT_OTHER): Payer: Self-pay

## 2021-07-04 ENCOUNTER — Ambulatory Visit: Payer: Self-pay

## 2021-07-04 DIAGNOSIS — M810 Age-related osteoporosis without current pathological fracture: Secondary | ICD-10-CM | POA: Diagnosis not present

## 2021-07-04 DIAGNOSIS — U071 COVID-19: Secondary | ICD-10-CM | POA: Diagnosis not present

## 2021-07-04 DIAGNOSIS — I700 Atherosclerosis of aorta: Secondary | ICD-10-CM | POA: Diagnosis not present

## 2021-07-04 DIAGNOSIS — I350 Nonrheumatic aortic (valve) stenosis: Secondary | ICD-10-CM | POA: Diagnosis not present

## 2021-07-04 DIAGNOSIS — E785 Hyperlipidemia, unspecified: Secondary | ICD-10-CM | POA: Diagnosis not present

## 2021-07-04 DIAGNOSIS — D649 Anemia, unspecified: Secondary | ICD-10-CM | POA: Diagnosis not present

## 2021-07-04 DIAGNOSIS — K219 Gastro-esophageal reflux disease without esophagitis: Secondary | ICD-10-CM | POA: Diagnosis not present

## 2021-07-04 DIAGNOSIS — J9621 Acute and chronic respiratory failure with hypoxia: Secondary | ICD-10-CM | POA: Diagnosis not present

## 2021-07-04 DIAGNOSIS — J441 Chronic obstructive pulmonary disease with (acute) exacerbation: Secondary | ICD-10-CM | POA: Diagnosis not present

## 2021-07-04 NOTE — Telephone Encounter (Signed)
Patient has the soonest available appt with Dr. Margarita Rana on 10/5. Asked Judson Roch if patient wanted to see different provider but patient only wanted Dr. Margarita Rana.

## 2021-07-04 NOTE — Telephone Encounter (Signed)
Pt called requesting a different antibiotic to be called in. Pt stated her sx of very frequent productive harsh cough, chest congestion, and chest tightness. No radiation to left shoulder, neck, back or jaw. Pt talking in full sentences. Pt cough producing gray phlegm. Work of breathing at baseline per pt. Pt stated that she wheezes when recumbent. Pt afebrile and has h/o COPD. Pt is requesting a virtual visit. Pt only wants to see Dr. Margarita Rana.  No available appts. Reached out to [place] at office and was advised Dr. Margarita Rana has no VV available. Advised pt to go to [facility] or ED if condition worsens.        Reason for Disposition  [1] Known COPD or other severe lung disease (i.e., bronchiectasis, cystic fibrosis, lung surgery) AND [2] worsening symptoms (i.e., increased sputum purulence or amount, increased breathing difficulty  Answer Assessment - Initial Assessment Questions 1. ONSET: "When did the cough begin?"      First of  2. SEVERITY: "How bad is the cough today?"      Lose breath after coughing- coughing very frequently 3. SPUTUM: "Describe the color of your sputum" (none, dry cough; clear, white, yellow, green)     grey 4. HEMOPTYSIS: "Are you coughing up any blood?" If so ask: "How much?" (flecks, streaks, tablespoons, etc.)     no 5. DIFFICULTY BREATHING: "Are you having difficulty breathing?" If Yes, ask: "How bad is it?" (e.g., mild, moderate, severe)    - MILD: No SOB at rest, mild SOB with walking, speaks normally in sentences, can lie down, no retractions, pulse < 100.    - MODERATE: SOB at rest, SOB with minimal exertion and prefers to sit, cannot lie down flat, speaks in phrases, mild retractions, audible wheezing, pulse 100-120.    - SEVERE: Very SOB at rest, speaks in single words, struggling to breathe, sitting hunched forward, retractions, pulse > 120      Moderate, wheezing, Chest tightness 6. FEVER: "Do you have a fever?" If Yes, ask: "What is your temperature, how was it  measured, and when did it start?"     no 7. CARDIAC HISTORY: "Do you have any history of heart disease?" (e.g., heart attack, congestive heart failure)      no 8. LUNG HISTORY: "Do you have any history of lung disease?"  (e.g., pulmonary embolus, asthma, emphysema)     COPD 9. PE RISK FACTORS: "Do you have a history of blood clots?" (or: recent major surgery, recent prolonged travel, bedridden)     no 10. OTHER SYMPTOMS: "Do you have any other symptoms?" (e.g., runny nose, wheezing, chest pain)       Wheezing, occasional runny nose (clear) Chest tightness 11. PREGNANCY: "Is there any chance you are pregnant?" "When was your last menstrual period?"       N/a 12. TRAVEL: "Have you traveled out of the country in the last month?" (e.g., travel history, exposures)       no  Protocols used: Cough - Acute Productive-A-AH

## 2021-07-05 ENCOUNTER — Encounter: Payer: Self-pay | Admitting: Nurse Practitioner

## 2021-07-05 ENCOUNTER — Telehealth: Payer: Self-pay | Admitting: Family Medicine

## 2021-07-05 NOTE — Telephone Encounter (Signed)
Noted  

## 2021-07-05 NOTE — Telephone Encounter (Signed)
See new encounter

## 2021-07-05 NOTE — Telephone Encounter (Signed)
Patient called and wanted to know what did Dr. Margarita Rana say on yesterday about sending in a stronger antibiotic. She says she spoke with nurse Sarah yesterday and was told she would receive a call back and no one has called her. I advised I will send this message to Dr. Margarita Rana and someone will call with her recommendation. Patient requests to be called today.

## 2021-07-05 NOTE — Telephone Encounter (Signed)
Copied from [place] #[id number]. Topic: General - Call Back - No Documentation >> Jul 03, 2021  1:35 PM Erick Blinks wrote: Pt wants to discuss her imaging results with the clinic, please advise  Best contact [phone]

## 2021-07-05 NOTE — Telephone Encounter (Signed)
Pt has upcoming appointment to discuss 

## 2021-07-06 ENCOUNTER — Other Ambulatory Visit (HOSPITAL_BASED_OUTPATIENT_CLINIC_OR_DEPARTMENT_OTHER): Payer: Self-pay

## 2021-07-06 ENCOUNTER — Telehealth: Payer: Self-pay | Admitting: Pulmonary Disease

## 2021-07-06 DIAGNOSIS — I700 Atherosclerosis of aorta: Secondary | ICD-10-CM | POA: Diagnosis not present

## 2021-07-06 DIAGNOSIS — J441 Chronic obstructive pulmonary disease with (acute) exacerbation: Secondary | ICD-10-CM | POA: Diagnosis not present

## 2021-07-06 DIAGNOSIS — J9621 Acute and chronic respiratory failure with hypoxia: Secondary | ICD-10-CM | POA: Diagnosis not present

## 2021-07-06 DIAGNOSIS — E785 Hyperlipidemia, unspecified: Secondary | ICD-10-CM | POA: Diagnosis not present

## 2021-07-06 DIAGNOSIS — K219 Gastro-esophageal reflux disease without esophagitis: Secondary | ICD-10-CM | POA: Diagnosis not present

## 2021-07-06 DIAGNOSIS — I350 Nonrheumatic aortic (valve) stenosis: Secondary | ICD-10-CM | POA: Diagnosis not present

## 2021-07-06 DIAGNOSIS — U071 COVID-19: Secondary | ICD-10-CM | POA: Diagnosis not present

## 2021-07-06 DIAGNOSIS — D649 Anemia, unspecified: Secondary | ICD-10-CM | POA: Diagnosis not present

## 2021-07-06 DIAGNOSIS — M810 Age-related osteoporosis without current pathological fracture: Secondary | ICD-10-CM | POA: Diagnosis not present

## 2021-07-06 MED ORDER — LEVOFLOXACIN 750 MG PO TABS
750.0000 mg | ORAL_TABLET | Freq: Every day | ORAL | 0 refills | Status: DC
  Filled 2021-07-06: qty 7, 7d supply, fill #0

## 2021-07-06 NOTE — Telephone Encounter (Signed)
Please send in levofloxacin 750 mg/day for 7 days. She will need follow up chest xray and appointment  I have opening on 10/5 to schedule her

## 2021-07-06 NOTE — Telephone Encounter (Signed)
Pt is requesting stronger antibiotic.

## 2021-07-06 NOTE — Telephone Encounter (Signed)
Primary Pulmonologist: Dr. Vaughan Browner Last office visit and with whom: 01/24/21 with Dr. Vaughan Browner What do we see them for (pulmonary problems): COPD, group D Last OV assessment/plan: see below  Was appointment offered to patient (explain)?    Assessment:  Severe COPD,Gold Stage D, Chronic bronchitis Continues on Breo and Spiriva.   I am not sure if she is generating enough inspiratory flow for the inhalers.  We discussed switching over to nebulizers but she would like to hold off on this change for now.   Has not tolerated Daliresp due to side effects Continue supplemental oxygen   She has finished pulmonary rehab 2 times already.  Encouraged her to stay active at home   Pulmonary nodule New nodule noted in January.  Follow-up CT ordered for 6 months   Plan/Recommendations: - Continue Breo, Spiriva - Follow-up CT chest     Marshell Garfinkel MD [facility] 01/24/2021, 12:10 PM   CC: [name], MD    Reason for call: I called and spoke with patient regarding message. Patient was in hospital earlier this month, been coughing since. CXR done on 06/30/21, shows PNA, was sent in Augmentin, which patient stated is not helping.to break up stuff in her chest. She was told to follow up with PCP in 2 weeks, has appt with them on 07/12/21. Patient stated needing something stronger to "break up stuff in chest" but cannot reach PCP. Using inhalers as prescribed. Does not have transportation until next week. Requesting recs. Will route to PM.  Dr. Vaughan Browner, please respond. Thanks!  (examples of things to ask: : When did symptoms start? Fever? Cough? Productive? Color to sputum? More sputum than usual? Wheezing? Have you needed increased oxygen? Are you taking your respiratory medications? What over the counter measures have you tried?)  Allergies  Allergen Reactions   Hydrocodone Nausea And Vomiting   Propoxyphene N-Acetaminophen Nausea And Vomiting    Immunization  History  Administered Date(s) Administered   Fluad Quad(high Dose 65+) 07/20/2019   Influenza Split 07/07/2013, 07/05/2016   Influenza Whole 11/09/2009   Influenza,inj,Quad PF,6+ Mos 06/24/2015, 07/03/2017, 06/19/2018   Pneumococcal Conjugate-13 09/26/2015   Pneumococcal Polysaccharide-23 10/08/1998, 07/08/2018   Td 10/08/2002   Tdap 09/10/2016

## 2021-07-06 NOTE — Telephone Encounter (Signed)
I called and spoke with patient regarding Dr. Vaughan Browner recs. Patient verbalized understanding and I sent Levaquin into preferred pharmacy and made appt with PM with CXR on 07/12/21. Nothing further needed.

## 2021-07-07 NOTE — Telephone Encounter (Signed)
Levaquin called into Pharmacy by Pulmonary.

## 2021-07-11 DIAGNOSIS — M810 Age-related osteoporosis without current pathological fracture: Secondary | ICD-10-CM | POA: Diagnosis not present

## 2021-07-11 DIAGNOSIS — I350 Nonrheumatic aortic (valve) stenosis: Secondary | ICD-10-CM | POA: Diagnosis not present

## 2021-07-11 DIAGNOSIS — I700 Atherosclerosis of aorta: Secondary | ICD-10-CM | POA: Diagnosis not present

## 2021-07-11 DIAGNOSIS — J9621 Acute and chronic respiratory failure with hypoxia: Secondary | ICD-10-CM | POA: Diagnosis not present

## 2021-07-11 DIAGNOSIS — D649 Anemia, unspecified: Secondary | ICD-10-CM | POA: Diagnosis not present

## 2021-07-11 DIAGNOSIS — K219 Gastro-esophageal reflux disease without esophagitis: Secondary | ICD-10-CM | POA: Diagnosis not present

## 2021-07-11 DIAGNOSIS — E785 Hyperlipidemia, unspecified: Secondary | ICD-10-CM | POA: Diagnosis not present

## 2021-07-11 DIAGNOSIS — J441 Chronic obstructive pulmonary disease with (acute) exacerbation: Secondary | ICD-10-CM | POA: Diagnosis not present

## 2021-07-11 DIAGNOSIS — U071 COVID-19: Secondary | ICD-10-CM | POA: Diagnosis not present

## 2021-07-12 ENCOUNTER — Ambulatory Visit (INDEPENDENT_AMBULATORY_CARE_PROVIDER_SITE_OTHER): Payer: Medicare HMO | Admitting: Pulmonary Disease

## 2021-07-12 ENCOUNTER — Encounter: Payer: Self-pay | Admitting: Pulmonary Disease

## 2021-07-12 ENCOUNTER — Inpatient Hospital Stay: Payer: Medicare HMO | Admitting: Family Medicine

## 2021-07-12 ENCOUNTER — Other Ambulatory Visit: Payer: Self-pay | Admitting: *Deleted

## 2021-07-12 ENCOUNTER — Telehealth: Payer: Self-pay

## 2021-07-12 ENCOUNTER — Ambulatory Visit (INDEPENDENT_AMBULATORY_CARE_PROVIDER_SITE_OTHER): Payer: Medicare HMO

## 2021-07-12 ENCOUNTER — Other Ambulatory Visit (HOSPITAL_BASED_OUTPATIENT_CLINIC_OR_DEPARTMENT_OTHER): Payer: Self-pay

## 2021-07-12 ENCOUNTER — Other Ambulatory Visit: Payer: Self-pay

## 2021-07-12 VITALS — BP 120/62 | HR 107 | Temp 97.8°F | Ht 64.0 in | Wt 114.4 lb

## 2021-07-12 DIAGNOSIS — J449 Chronic obstructive pulmonary disease, unspecified: Secondary | ICD-10-CM

## 2021-07-12 DIAGNOSIS — R059 Cough, unspecified: Secondary | ICD-10-CM

## 2021-07-12 DIAGNOSIS — J439 Emphysema, unspecified: Secondary | ICD-10-CM | POA: Diagnosis not present

## 2021-07-12 DIAGNOSIS — Z23 Encounter for immunization: Secondary | ICD-10-CM

## 2021-07-12 MED ORDER — PREDNISONE 20 MG PO TABS
ORAL_TABLET | ORAL | 0 refills | Status: DC
  Filled 2021-07-12: qty 10, 5d supply, fill #0

## 2021-07-12 NOTE — Progress Notes (Signed)
[name]    [id number]    1950/12/04  Primary Care Physician:Newlin, Charlane Ferretti, MD  Referring Physician: [name], MD [place],  [place] [address]  Chief complaint:   Follow up for COPD GOLD D  HPI: 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 Her symptoms consist mainly of shortness of breath, daily cough with whitish sputum production, wheeze, dyspnea and exertion. She does not have any hemoptysis, chest pain, palpitations. No fevers, chills.   She is currently maintained on Spiriva and breo.  Tried on Trelegy inhaler in 2019 but had to go back to [place] and Spiriva since it made her breathing worse.  Also tried on Daliresp in 2019 but had to stop due to side effects of nausea  Continues on Breo and Spiriva She had tried Trelegy again in early 2022 but it made her breathing worse She got a course of prednisone in March 2022 for mild COPD exacerbation.  She had smoked one pack per day for nearly 25 years. Quit in 2020. 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 She does not recall any particular exposures at work or at home.  Interim history: Had bilateral pneumonia on chest x-ray primary care and was given a course of Augmentin which did not improve symptoms.  She then got levofloxacin for our office which improved her cough  States that dyspnea is getting better.  No fevers or chills Reports wheezing when she lays down at night  Outpatient Encounter Medications as of 07/12/2021  Medication Sig   acetaminophen (TYLENOL) 500 MG tablet Take 1,500 mg by mouth every 4 (four) hours as needed for headache.    albuterol (PROVENTIL) (2.5 MG/3ML) 0.083% nebulizer solution INHALE THE CONTENTS OF 1 VIAL VIA NEBULIZER EVERY 6 HOURS AS NEEDED FOR WHEEZING OR SHORTNESS OF BREATH (Patient taking differently: Take 2.5 mg by nebulization every 6 (six) hours as needed for  shortness of breath or wheezing. INHALE THE CONTENTS OF 1 VIAL VIA NEBULIZER EVERY 6 HOURS AS NEEDED FOR WHEEZING OR SHORTNESS OF BREATH)   albuterol (VENTOLIN HFA) 108 (90 Base) MCG/ACT inhaler Inhale 2 puffs into the lungs every 6 (six) hours as needed for wheezing or shortness of breath.   aspirin (ASPIRIN ADULT LOW STRENGTH) 81 MG EC tablet TAKE 1 TABLET (81 MG TOTAL) BY MOUTH DAILY. (Patient taking differently: Take 81 mg by mouth at bedtime.)   fluticasone (FLONASE) 50 MCG/ACT nasal spray USE 2 SPRAYS IN EACH NOSTRIL EVERY DAY (Patient taking differently: Place 2 sprays into both nostrils daily as needed for allergies.)   fluticasone furoate-vilanterol (BREO ELLIPTA) 200-25 MCG/INH AEPB Inhale 1 puff into the lungs daily.   levofloxacin (LEVAQUIN) 750 MG tablet Take 1 tablet (750 mg total) by mouth daily.   loratadine (CLARITIN) 10 MG tablet TAKE 1 TABLET (10 MG TOTAL) BY MOUTH DAILY.   OXYGEN Inhale 2 L into the lungs daily.   pantoprazole (PROTONIX) 40 MG tablet Take 1 tablet (40 mg total) by mouth daily.   Tiotropium Bromide Monohydrate (SPIRIVA RESPIMAT) 2.5 MCG/ACT AERS Inhale 2 puffs into the lungs daily.   amoxicillin-clavulanate (AUGMENTIN) 875-125 MG tablet Take 1 tablet by mouth 2 (two) times daily.   atorvastatin (LIPITOR) 40 MG tablet Take 1 tablet (40 mg total) by mouth daily. (Patient taking differently: Take 40 mg by mouth at bedtime.)   predniSONE (  DELTASONE) 10 MG tablet Take 6 tablets by mouth on day 1, then 5 tablets on day 2, then 4 tablets on day 3, then 3 tablets on day 4, then 2 tablets on day 5, then 1 tablet on day 6.   No facility-administered encounter medications on file as of 07/12/2021.   Physical Exam: Blood pressure 120/62, pulse (!) 107, temperature 97.8 F (36.6 C), temperature source Oral, height 5\' 4"  (1.626 m), weight 114 lb 6.4 oz (51.9 kg), SpO2 94 %. Gen:      No acute distress HEENT:  EOMI, sclera anicteric Neck:     No masses; no thyromegaly Lungs:     Clear to auscultation bilaterally; normal respiratory effort CV:         Regular rate and rhythm; no murmurs Abd:      + bowel sounds; soft, non-tender; no palpable masses, no distension Ext:    No edema; adequate peripheral perfusion Skin:      Warm and dry; no rash Neuro: alert and oriented x 3 Psych: normal mood and affect   Data Reviewed: Imaging: Screening CT chest 03/07/18- calcified right thyroid nodule.  Severe centrilobular emphysema, bronchial wall thickening.  Previously described 4.8 mm right lower lobe nodule now measures 2.9 mm.  Granulomatous splenic and liver calcification.  CT 10/20/2019-resolution of left lower lobe pulmonary nodule, no other lung abnormality   Low-dose screening CT 10/20/2020-new 5.9 mm right lower lobe pulmonary nodule, emphysema, atherosclerosis, gallstones.  Low-dose screening CT 04/24/2021-improvement of right lower lobe pulmonary nodule, emphysema I have reviewed the images personally.   PFTs (07/21/15) FVC 2.06 66%), FEV1 0.93 (39%), F/F 45, DLCO 36% Unable complete pleth. Severe obstructive lung disease, severe diffusion impairment  Spirometry 04/12/16 FVC 1.79 [5 7%), FEV1 0.65 (27%), F/F 36 Very severe obstructive airway disease  Labs: CBC 12/03/2018-WBC 9.2, eos 0% ID 10/16/18-1931 Alpha-1 antitrypsin 11/05/2018-171, PI MM  Assessment:  Pneumonia She has been treated with Augmentin and levofloxacin for pneumonia and is recovering slowly Chest x-ray today by my review shows improving infiltrates As she continues to be dyspneic with congestion I will give her prednisone 40 mg a day for 5 days  Severe COPD,Gold Stage D, Chronic bronchitis Continues on Breo and Spiriva.   I am not sure if she is generating enough inspiratory flow for the inhalers.  We discussed switching over to nebulizers but she would like to hold off on this change for now.  Has not tolerated Daliresp due to side effects Continue supplemental oxygen  She has  finished pulmonary rehab 2 times already.  Encouraged her to stay active at home  Pulmonary nodule Continue annual low-dose screening CT   Plan/Recommendations: - Continue Breo, Spiriva - Prednisone 40 mg a day for 5 days  Marshell Garfinkel MD [facility] 07/12/2021, 10:41 AM  CC: [name], MD

## 2021-07-12 NOTE — Patient Instructions (Addendum)
I am glad you are better I have reviewed a chest x-ray which shows some improvement in pneumonia   Will order prednisone 40 mg a day for 5 days Continue inhalers Follow-up in 6 months

## 2021-07-12 NOTE — Addendum Note (Signed)
Addended by: Elton Sin on: 07/12/2021 11:00 AM   Modules accepted: Orders

## 2021-07-12 NOTE — Telephone Encounter (Signed)
Error

## 2021-07-13 DIAGNOSIS — E785 Hyperlipidemia, unspecified: Secondary | ICD-10-CM | POA: Diagnosis not present

## 2021-07-13 DIAGNOSIS — K219 Gastro-esophageal reflux disease without esophagitis: Secondary | ICD-10-CM | POA: Diagnosis not present

## 2021-07-13 DIAGNOSIS — J441 Chronic obstructive pulmonary disease with (acute) exacerbation: Secondary | ICD-10-CM | POA: Diagnosis not present

## 2021-07-13 DIAGNOSIS — U071 COVID-19: Secondary | ICD-10-CM | POA: Diagnosis not present

## 2021-07-13 DIAGNOSIS — I700 Atherosclerosis of aorta: Secondary | ICD-10-CM | POA: Diagnosis not present

## 2021-07-13 DIAGNOSIS — J9621 Acute and chronic respiratory failure with hypoxia: Secondary | ICD-10-CM | POA: Diagnosis not present

## 2021-07-13 DIAGNOSIS — I350 Nonrheumatic aortic (valve) stenosis: Secondary | ICD-10-CM | POA: Diagnosis not present

## 2021-07-13 DIAGNOSIS — M810 Age-related osteoporosis without current pathological fracture: Secondary | ICD-10-CM | POA: Diagnosis not present

## 2021-07-13 DIAGNOSIS — D649 Anemia, unspecified: Secondary | ICD-10-CM | POA: Diagnosis not present

## 2021-07-18 ENCOUNTER — Other Ambulatory Visit (HOSPITAL_BASED_OUTPATIENT_CLINIC_OR_DEPARTMENT_OTHER): Payer: Self-pay

## 2021-07-18 ENCOUNTER — Telehealth: Payer: Self-pay | Admitting: Pulmonary Disease

## 2021-07-18 MED ORDER — PREDNISONE 10 MG PO TABS
ORAL_TABLET | ORAL | 0 refills | Status: DC
  Filled 2021-07-18: qty 42, 12d supply, fill #0

## 2021-07-18 NOTE — Telephone Encounter (Signed)
Tried calling the pt and there was no answer- LMTCB.  

## 2021-07-18 NOTE — Telephone Encounter (Signed)
Called and spoke with pt and she stated that she finished the prednisone yesterday and she stated that she feels that this did not help her at all.  She is still having SHOB and her cough is back.  She stated that she is able to cough up white/grey sputum at times, other times it will not come up.  She is wanting further recs from PM.  Please advise. Thanks  Allergies  Allergen Reactions   Hydrocodone Nausea And Vomiting   Propoxyphene N-Acetaminophen Nausea And Vomiting

## 2021-07-18 NOTE — Telephone Encounter (Signed)
I do not believe she will require additional antibiotics as chest x-ray shows improved infiltrates Give another round of prednisone starting at 60 mg.  Reduce dose by 10 mg every 2 days Use Mucinex and Delsym over-the-counter

## 2021-07-18 NOTE — Telephone Encounter (Signed)
Spoke with the pt and notified of response per Dr Vaughan Browner  She verbalized understanding  Rx was sent  Nothing further needed

## 2021-07-19 DIAGNOSIS — U071 COVID-19: Secondary | ICD-10-CM | POA: Diagnosis not present

## 2021-07-19 DIAGNOSIS — I700 Atherosclerosis of aorta: Secondary | ICD-10-CM | POA: Diagnosis not present

## 2021-07-19 DIAGNOSIS — J441 Chronic obstructive pulmonary disease with (acute) exacerbation: Secondary | ICD-10-CM | POA: Diagnosis not present

## 2021-07-19 DIAGNOSIS — I350 Nonrheumatic aortic (valve) stenosis: Secondary | ICD-10-CM | POA: Diagnosis not present

## 2021-07-19 DIAGNOSIS — E785 Hyperlipidemia, unspecified: Secondary | ICD-10-CM | POA: Diagnosis not present

## 2021-07-19 DIAGNOSIS — J9621 Acute and chronic respiratory failure with hypoxia: Secondary | ICD-10-CM | POA: Diagnosis not present

## 2021-07-19 DIAGNOSIS — M810 Age-related osteoporosis without current pathological fracture: Secondary | ICD-10-CM | POA: Diagnosis not present

## 2021-07-19 DIAGNOSIS — K219 Gastro-esophageal reflux disease without esophagitis: Secondary | ICD-10-CM | POA: Diagnosis not present

## 2021-07-19 DIAGNOSIS — D649 Anemia, unspecified: Secondary | ICD-10-CM | POA: Diagnosis not present

## 2021-07-20 DIAGNOSIS — I700 Atherosclerosis of aorta: Secondary | ICD-10-CM | POA: Diagnosis not present

## 2021-07-20 DIAGNOSIS — I350 Nonrheumatic aortic (valve) stenosis: Secondary | ICD-10-CM | POA: Diagnosis not present

## 2021-07-20 DIAGNOSIS — J9621 Acute and chronic respiratory failure with hypoxia: Secondary | ICD-10-CM | POA: Diagnosis not present

## 2021-07-20 DIAGNOSIS — D649 Anemia, unspecified: Secondary | ICD-10-CM | POA: Diagnosis not present

## 2021-07-20 DIAGNOSIS — U071 COVID-19: Secondary | ICD-10-CM | POA: Diagnosis not present

## 2021-07-20 DIAGNOSIS — M810 Age-related osteoporosis without current pathological fracture: Secondary | ICD-10-CM | POA: Diagnosis not present

## 2021-07-20 DIAGNOSIS — J441 Chronic obstructive pulmonary disease with (acute) exacerbation: Secondary | ICD-10-CM | POA: Diagnosis not present

## 2021-07-20 DIAGNOSIS — K219 Gastro-esophageal reflux disease without esophagitis: Secondary | ICD-10-CM | POA: Diagnosis not present

## 2021-07-20 DIAGNOSIS — E785 Hyperlipidemia, unspecified: Secondary | ICD-10-CM | POA: Diagnosis not present

## 2021-07-22 DIAGNOSIS — D649 Anemia, unspecified: Secondary | ICD-10-CM | POA: Diagnosis not present

## 2021-07-22 DIAGNOSIS — J9621 Acute and chronic respiratory failure with hypoxia: Secondary | ICD-10-CM | POA: Diagnosis not present

## 2021-07-22 DIAGNOSIS — I350 Nonrheumatic aortic (valve) stenosis: Secondary | ICD-10-CM | POA: Diagnosis not present

## 2021-07-22 DIAGNOSIS — U071 COVID-19: Secondary | ICD-10-CM | POA: Diagnosis not present

## 2021-07-22 DIAGNOSIS — J441 Chronic obstructive pulmonary disease with (acute) exacerbation: Secondary | ICD-10-CM | POA: Diagnosis not present

## 2021-07-22 DIAGNOSIS — K219 Gastro-esophageal reflux disease without esophagitis: Secondary | ICD-10-CM | POA: Diagnosis not present

## 2021-07-22 DIAGNOSIS — M810 Age-related osteoporosis without current pathological fracture: Secondary | ICD-10-CM | POA: Diagnosis not present

## 2021-07-22 DIAGNOSIS — E785 Hyperlipidemia, unspecified: Secondary | ICD-10-CM | POA: Diagnosis not present

## 2021-07-22 DIAGNOSIS — I700 Atherosclerosis of aorta: Secondary | ICD-10-CM | POA: Diagnosis not present

## 2021-07-25 DIAGNOSIS — I700 Atherosclerosis of aorta: Secondary | ICD-10-CM | POA: Diagnosis not present

## 2021-07-25 DIAGNOSIS — D649 Anemia, unspecified: Secondary | ICD-10-CM | POA: Diagnosis not present

## 2021-07-25 DIAGNOSIS — M810 Age-related osteoporosis without current pathological fracture: Secondary | ICD-10-CM | POA: Diagnosis not present

## 2021-07-25 DIAGNOSIS — E785 Hyperlipidemia, unspecified: Secondary | ICD-10-CM | POA: Diagnosis not present

## 2021-07-25 DIAGNOSIS — K219 Gastro-esophageal reflux disease without esophagitis: Secondary | ICD-10-CM | POA: Diagnosis not present

## 2021-07-25 DIAGNOSIS — J441 Chronic obstructive pulmonary disease with (acute) exacerbation: Secondary | ICD-10-CM | POA: Diagnosis not present

## 2021-07-25 DIAGNOSIS — J9621 Acute and chronic respiratory failure with hypoxia: Secondary | ICD-10-CM | POA: Diagnosis not present

## 2021-07-25 DIAGNOSIS — U071 COVID-19: Secondary | ICD-10-CM | POA: Diagnosis not present

## 2021-07-25 DIAGNOSIS — I350 Nonrheumatic aortic (valve) stenosis: Secondary | ICD-10-CM | POA: Diagnosis not present

## 2021-07-28 ENCOUNTER — Telehealth: Payer: Self-pay | Admitting: Pulmonary Disease

## 2021-07-28 ENCOUNTER — Other Ambulatory Visit (HOSPITAL_BASED_OUTPATIENT_CLINIC_OR_DEPARTMENT_OTHER): Payer: Self-pay

## 2021-07-28 MED ORDER — AZITHROMYCIN 250 MG PO TABS
ORAL_TABLET | ORAL | 0 refills | Status: DC
  Filled 2021-07-28: qty 6, 5d supply, fill #0

## 2021-07-28 NOTE — Telephone Encounter (Signed)
Pt calling to see if Mannam feels like she needs another abx. Pt states prednisone isn't working. Pt has a lot of "junk in her lungs" and cough, pt states she's been on prednisone for "awhile" and she wonders if another abx will help her knock the last bit of pna out. Please advise [phone]

## 2021-07-28 NOTE — Telephone Encounter (Signed)
Patient returned call. She stated that the prednisone is not helping her cough at all. The phlegm is still thick. She denied seeing any color changes or developing any new symptoms since she last called [place]. She has tried using Mucinex and Delsym and they have not helped at all.   She wants to know if Dr. Vaughan Browner will reconsider sending in an abx for her as she seems to think this will help get rid of the cough.   Pharmacy is [facility].   Dr. Vaughan Browner, can you please advise? Thanks!

## 2021-07-28 NOTE — Telephone Encounter (Signed)
Left message for patient to call back  

## 2021-07-28 NOTE — Telephone Encounter (Signed)
Please send in Z-Pak

## 2021-07-28 NOTE — Telephone Encounter (Signed)
Called patient but she did not answer. Left message for her to call us back.  

## 2021-07-28 NOTE — Telephone Encounter (Signed)
Patient called back. She verbalized understanding. Zpak has been sent in. Nothing further needed at time of call.

## 2021-08-01 ENCOUNTER — Telehealth: Payer: Self-pay | Admitting: Family Medicine

## 2021-08-01 DIAGNOSIS — M810 Age-related osteoporosis without current pathological fracture: Secondary | ICD-10-CM | POA: Diagnosis not present

## 2021-08-01 DIAGNOSIS — U071 COVID-19: Secondary | ICD-10-CM | POA: Diagnosis not present

## 2021-08-01 DIAGNOSIS — K219 Gastro-esophageal reflux disease without esophagitis: Secondary | ICD-10-CM | POA: Diagnosis not present

## 2021-08-01 DIAGNOSIS — E785 Hyperlipidemia, unspecified: Secondary | ICD-10-CM | POA: Diagnosis not present

## 2021-08-01 DIAGNOSIS — J441 Chronic obstructive pulmonary disease with (acute) exacerbation: Secondary | ICD-10-CM | POA: Diagnosis not present

## 2021-08-01 DIAGNOSIS — I350 Nonrheumatic aortic (valve) stenosis: Secondary | ICD-10-CM | POA: Diagnosis not present

## 2021-08-01 DIAGNOSIS — I700 Atherosclerosis of aorta: Secondary | ICD-10-CM | POA: Diagnosis not present

## 2021-08-01 DIAGNOSIS — D649 Anemia, unspecified: Secondary | ICD-10-CM | POA: Diagnosis not present

## 2021-08-01 DIAGNOSIS — J9621 Acute and chronic respiratory failure with hypoxia: Secondary | ICD-10-CM | POA: Diagnosis not present

## 2021-08-01 NOTE — Telephone Encounter (Signed)
Margaret Henry, from [facility], calling stating that the pt is having burning with urination w/ sweats and clammy feeling. Please advise.     [name]

## 2021-08-07 ENCOUNTER — Telehealth: Payer: Self-pay | Admitting: Pulmonary Disease

## 2021-08-07 DIAGNOSIS — U071 COVID-19: Secondary | ICD-10-CM | POA: Diagnosis not present

## 2021-08-07 DIAGNOSIS — I700 Atherosclerosis of aorta: Secondary | ICD-10-CM | POA: Diagnosis not present

## 2021-08-07 DIAGNOSIS — J9621 Acute and chronic respiratory failure with hypoxia: Secondary | ICD-10-CM | POA: Diagnosis not present

## 2021-08-07 DIAGNOSIS — E785 Hyperlipidemia, unspecified: Secondary | ICD-10-CM | POA: Diagnosis not present

## 2021-08-07 DIAGNOSIS — M810 Age-related osteoporosis without current pathological fracture: Secondary | ICD-10-CM | POA: Diagnosis not present

## 2021-08-07 DIAGNOSIS — I350 Nonrheumatic aortic (valve) stenosis: Secondary | ICD-10-CM | POA: Diagnosis not present

## 2021-08-07 DIAGNOSIS — K219 Gastro-esophageal reflux disease without esophagitis: Secondary | ICD-10-CM | POA: Diagnosis not present

## 2021-08-07 DIAGNOSIS — D649 Anemia, unspecified: Secondary | ICD-10-CM | POA: Diagnosis not present

## 2021-08-07 DIAGNOSIS — J441 Chronic obstructive pulmonary disease with (acute) exacerbation: Secondary | ICD-10-CM | POA: Diagnosis not present

## 2021-08-07 NOTE — Telephone Encounter (Signed)
Lm for Margaret Henry.

## 2021-08-07 NOTE — Telephone Encounter (Signed)
I called Terri back and I have made a MyChart Video visit for this patient and she is aware to go to the ER if symptoms are worse before the OV tomorrow. Nothing further needed.

## 2021-08-08 ENCOUNTER — Other Ambulatory Visit (HOSPITAL_BASED_OUTPATIENT_CLINIC_OR_DEPARTMENT_OTHER): Payer: Self-pay

## 2021-08-08 ENCOUNTER — Telehealth (INDEPENDENT_AMBULATORY_CARE_PROVIDER_SITE_OTHER): Payer: Medicare HMO | Admitting: Primary Care

## 2021-08-08 ENCOUNTER — Telehealth: Payer: Self-pay

## 2021-08-08 ENCOUNTER — Encounter: Payer: Self-pay | Admitting: Primary Care

## 2021-08-08 DIAGNOSIS — J441 Chronic obstructive pulmonary disease with (acute) exacerbation: Secondary | ICD-10-CM | POA: Diagnosis not present

## 2021-08-08 MED ORDER — BREZTRI AEROSPHERE 160-9-4.8 MCG/ACT IN AERO
2.0000 | INHALATION_SPRAY | Freq: Two times a day (BID) | RESPIRATORY_TRACT | 5 refills | Status: DC
  Filled 2021-08-08: qty 10.7, 30d supply, fill #0

## 2021-08-08 MED ORDER — GUAIFENESIN ER 600 MG PO TB12
1200.0000 mg | ORAL_TABLET | Freq: Two times a day (BID) | ORAL | 1 refills | Status: DC
  Filled 2021-08-08: qty 60, 15d supply, fill #0

## 2021-08-08 MED ORDER — PREDNISONE 10 MG PO TABS
ORAL_TABLET | ORAL | 0 refills | Status: DC
  Filled 2021-08-08: qty 30, 30d supply, fill #0

## 2021-08-08 NOTE — Progress Notes (Signed)
Virtual Visit via Video Note  I connected with Laurence Compton on 08/08/21 at 11:30 AM EDT by a video enabled telemedicine application and verified that I am speaking with the correct person using two identifiers.  Location: Patient: Home  Provider: Office   I discussed the limitations of evaluation and management by telemedicine and the availability of in person appointments. The patient expressed understanding and agreed to proceed.  History of Present Illness: 70 year old female, former smoker quit in January 2020 (73.5-pack-year history). Past medical history significant for COPD GOLD D, chronic respiratory failure, mild aortic insufficiency, coronary arteriosclerosis, hyperlipidemia. Patient of Dr. Vaughan Browner, last seen on 07/12/21.   Previous [facility] pulmonary encounter: Mrs. Yolanda Bonine is a 70 year old with history of COPD GOLD D (CAT score 34, multiple exacerbations) diagnosed in 2014. Her symptoms consist mainly of shortness of breath, daily cough with whitish sputum production, wheeze, dyspnea and exertion. She does not have any hemoptysis, chest pain, palpitations. No fevers, chills.   She is currently maintained on Spiriva and breo.  Tried on Trelegy inhaler in 2019 but had to go back to [place] and Spiriva since it made her breathing worse.  Also tried on Daliresp in 2019 but had to stop due to side effects of nausea   Continues on Breo and Spiriva She had tried Trelegy again in early 2022 but it made her breathing worse She got a course of prednisone in March 2022 for mild COPD exacerbation.   She had smoked one pack per day for nearly 25 years. Quit in 2020. 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 She does not recall any particular exposures at work or at home.   07/12/21- Dr. Vaughan Browner Had bilateral pneumonia on chest x-ray primary care and was given a course of Augmentin which did not improve symptoms.  She then got levofloxacin for our office which improved her  cough  States that dyspnea is getting better.  No fevers or chills Reports wheezing when she lays down at night   08/08/2021 - Interim hx  Patient contacted today for virtual video visit. Attempted video visit several times but were unsuccessful. Patient was treated for pneumonia in September with course of Augmentin and Levaquin. Repeat CXR on 07/13/21 showed COPD changes with chronic interstitial prominence. No acute abnormalities. She called our office on 10/11 and 10/21 for sob and cough. She had two rounds of prednisone in early October with no improvement. She recently completed zpack and improved temporarily but still has congested cough. Cough is mostly non-productive. She gets out of breath with coughing fits. She is compliant with Breo 265mcg and Spiriva. She is using albuterol nebulizer twice a day along with IS and flutter device. She experiences wheezing at night when lying down. No improvement with Mucinex and Delsym. She is wearing 2.5L oxygen. Denies fever, chills, sweats, chest discomfort.    Observations/Objective:  Patient reported:  BP 117/754; 96/52  O2 96% on 2.5L   Imaging: 04/24/21 LDCT >> Lungs/Pleura: The lesion of concern on the prior study (axial image 66 of series 3) has substantially decreased in size compared to the prior examination, currently with a volume derived mean diameter of 2.7 mm. No other new suspicious appearing pulmonary nodules or masses are noted. No acute consolidative airspace disease. No pleural effusions. Diffuse bronchial wall thickening with severe centrilobular and paraseptal emphysema.  06/13/21 CTA >> severe diffuse emphysema pattern and hyperinflation. Minro basilar scarring. No pleural abnormality.   Assessment and Plan:  COPD GOLD D: - Frequent exacerbations - Trial Breztri two puffs twice daily - Check respiratory sputum sample - Start 10mg  prednisone daily until follow-up  - Increase Mucinex 1200mg  twice daily  - Consider  adding daliresp in the future   Chronic respiratory failure: - Stable; O2 96% on 2.5L - Continue supplemental oxygen to maintain O2 >88-90%   Follow Up Instructions:  - 1 month with Dr. Vaughan Browner   I discussed the assessment and treatment plan with the patient. The patient was provided an opportunity to ask questions and all were answered. The patient agreed with the plan and demonstrated an understanding of the instructions.   The patient was advised to call back or seek an in-person evaluation if the symptoms worsen or if the condition fails to improve as anticipated.  I provided 25 minutes of non-face-to-face time during this encounter.   Martyn Ehrich, NP

## 2021-08-08 NOTE — Patient Instructions (Addendum)
   Recommendations: - Stop Breo and Spiriva  - Trial Breztri Aerosphere- take two puffs morning and evening - Start 10mg  prednisone daily until follow-up  - Take Mucinex 1,00mg  twice daily x 7 days; then take 600mg  twice daily for additional week  - Use flutter valve three times a day  - Come in for sputum sample if able  Follow-up: 1 month with Dr. Vaughan Browner

## 2021-08-08 NOTE — Telephone Encounter (Signed)
LVM pt was not signed on to [facility] video visit at 11:31am per NP Volanda Napoleon

## 2021-08-13 ENCOUNTER — Emergency Department (HOSPITAL_BASED_OUTPATIENT_CLINIC_OR_DEPARTMENT_OTHER)
Admission: EM | Admit: 2021-08-13 | Discharge: 2021-08-13 | Disposition: A | Discharge status: Home / Self Care | Payer: Medicare HMO | Attending: Emergency Medicine | Admitting: Emergency Medicine

## 2021-08-13 ENCOUNTER — Other Ambulatory Visit: Payer: Self-pay

## 2021-08-13 ENCOUNTER — Emergency Department (HOSPITAL_BASED_OUTPATIENT_CLINIC_OR_DEPARTMENT_OTHER): Payer: Medicare HMO

## 2021-08-13 ENCOUNTER — Encounter (HOSPITAL_BASED_OUTPATIENT_CLINIC_OR_DEPARTMENT_OTHER): Payer: Self-pay | Admitting: *Deleted

## 2021-08-13 DIAGNOSIS — R000 Tachycardia, unspecified: Secondary | ICD-10-CM | POA: Insufficient documentation

## 2021-08-13 DIAGNOSIS — J441 Chronic obstructive pulmonary disease with (acute) exacerbation: Secondary | ICD-10-CM | POA: Diagnosis not present

## 2021-08-13 DIAGNOSIS — J449 Chronic obstructive pulmonary disease, unspecified: Secondary | ICD-10-CM | POA: Diagnosis not present

## 2021-08-13 DIAGNOSIS — Z7951 Long term (current) use of inhaled steroids: Secondary | ICD-10-CM | POA: Insufficient documentation

## 2021-08-13 DIAGNOSIS — Z8616 Personal history of COVID-19: Secondary | ICD-10-CM | POA: Diagnosis not present

## 2021-08-13 DIAGNOSIS — Z7982 Long term (current) use of aspirin: Secondary | ICD-10-CM | POA: Insufficient documentation

## 2021-08-13 DIAGNOSIS — Z79899 Other long term (current) drug therapy: Secondary | ICD-10-CM | POA: Insufficient documentation

## 2021-08-13 DIAGNOSIS — R059 Cough, unspecified: Secondary | ICD-10-CM | POA: Diagnosis not present

## 2021-08-13 DIAGNOSIS — Z85828 Personal history of other malignant neoplasm of skin: Secondary | ICD-10-CM | POA: Insufficient documentation

## 2021-08-13 DIAGNOSIS — R0602 Shortness of breath: Secondary | ICD-10-CM | POA: Diagnosis not present

## 2021-08-13 DIAGNOSIS — J439 Emphysema, unspecified: Secondary | ICD-10-CM | POA: Diagnosis not present

## 2021-08-13 LAB — URINALYSIS, ROUTINE W REFLEX MICROSCOPIC
Bilirubin Urine: NEGATIVE
Glucose, UA: NEGATIVE mg/dL
Hgb urine dipstick: NEGATIVE
Ketones, ur: NEGATIVE mg/dL
Leukocytes,Ua: NEGATIVE
Nitrite: NEGATIVE
Protein, ur: NEGATIVE mg/dL
Specific Gravity, Urine: 1.01 (ref 1.005–1.030)
pH: 6.5 (ref 5.0–8.0)

## 2021-08-13 LAB — CBC WITH DIFFERENTIAL/PLATELET
Abs Immature Granulocytes: 0.04 10*3/uL (ref 0.00–0.07)
Basophils Absolute: 0 10*3/uL (ref 0.0–0.1)
Basophils Relative: 1 %
Eosinophils Absolute: 0 10*3/uL (ref 0.0–0.5)
Eosinophils Relative: 0 %
HCT: 33.5 % — ABNORMAL LOW (ref 36.0–46.0)
Hemoglobin: 10 g/dL — ABNORMAL LOW (ref 12.0–15.0)
Immature Granulocytes: 1 %
Lymphocytes Relative: 12 %
Lymphs Abs: 1 10*3/uL (ref 0.7–4.0)
MCH: 26.4 pg (ref 26.0–34.0)
MCHC: 29.9 g/dL — ABNORMAL LOW (ref 30.0–36.0)
MCV: 88.4 fL (ref 80.0–100.0)
Monocytes Absolute: 0.3 10*3/uL (ref 0.1–1.0)
Monocytes Relative: 3 %
Neutro Abs: 7.4 10*3/uL (ref 1.7–7.7)
Neutrophils Relative %: 83 %
Platelets: 417 10*3/uL — ABNORMAL HIGH (ref 150–400)
RBC: 3.79 MIL/uL — ABNORMAL LOW (ref 3.87–5.11)
RDW: 19 % — ABNORMAL HIGH (ref 11.5–15.5)
WBC: 8.8 10*3/uL (ref 4.0–10.5)
nRBC: 0 % (ref 0.0–0.2)

## 2021-08-13 LAB — COMPREHENSIVE METABOLIC PANEL
ALT: 14 U/L (ref 0–44)
AST: 19 U/L (ref 15–41)
Albumin: 4.3 g/dL (ref 3.5–5.0)
Alkaline Phosphatase: 68 U/L (ref 38–126)
Anion gap: 9 (ref 5–15)
BUN: 17 mg/dL (ref 8–23)
CO2: 25 mmol/L (ref 22–32)
Calcium: 9.3 mg/dL (ref 8.9–10.3)
Chloride: 106 mmol/L (ref 98–111)
Creatinine, Ser: 0.56 mg/dL (ref 0.44–1.00)
GFR, Estimated: >60 mL/min (ref >60)
Glucose, Bld: 135 mg/dL — ABNORMAL HIGH (ref 70–99)
Potassium: 4.5 mmol/L (ref 3.5–5.1)
Sodium: 140 mmol/L (ref 135–145)
Total Bilirubin: 0.3 mg/dL (ref 0.3–1.2)
Total Protein: 7.7 g/dL (ref 6.5–8.1)

## 2021-08-13 LAB — LACTIC ACID, PLASMA: Lactic Acid, Venous: 1.9 mmol/L (ref 0.5–1.9)

## 2021-08-13 LAB — PROTIME-INR
INR: 0.9 (ref 0.8–1.2)
Prothrombin Time: 12.3 seconds (ref 11.4–15.2)

## 2021-08-13 MED ORDER — IPRATROPIUM-ALBUTEROL 0.5-2.5 (3) MG/3ML IN SOLN
3.0000 mL | Freq: Once | RESPIRATORY_TRACT | Status: AC
  Administered 2021-08-13: 3 mL via RESPIRATORY_TRACT
  Filled 2021-08-13: qty 3

## 2021-08-13 NOTE — ED Notes (Signed)
PCXR at this time.

## 2021-08-13 NOTE — ED Notes (Signed)
Dr. Rogene Houston made aware of pt VS and condition

## 2021-08-13 NOTE — ED Triage Notes (Addendum)
Hx of COPD. Wears 2L home oxygen. Congested cough. States "it tries to come up but then it doesn't and I can't breathe". She was hospitalized in September for pneumonia. Pt also reports dysuria and frequency

## 2021-08-13 NOTE — ED Provider Notes (Signed)
[facility] Provider Note   CSN: [id number] Arrival date & time: 08/13/21  1541     History Chief Complaint  Patient presents with   Shortness of Breath    Margaret Henry is a 70 y.o. female.  Patient with known severe COPD.  On 2 L of oxygen at all times.  Patient had an admission September 6 following a fall had she had pneumonia.  Patient is followed closely by [facility] pulmonary medicine.  She has been on steroids.  Patient is felt that breathing is never been back to normal since being discharged and they are trying different therapeutic regimens to try to improve that.  Patient does have nebulizer treatments available at home.  Patient felt that the breathing is been worse the past few days and may have been the warm weather.  Patient feels as if she has a lot of phlegm in her bronchial airways.  And she is having difficulty getting it coughed up.      Past Medical History:  Diagnosis Date   Allergy    hayfever   Bronchitis    Cancer ([place])    skin cancer on chest   Colon polyps    Complication of anesthesia    pt states was given too much during nasal surgery 1989; difficulty getting awake   COPD (chronic obstructive pulmonary disease) ([place])    Coronary artery calcification    Diverticulitis    GERD (gastroesophageal reflux disease)    occasional   Hemorrhoids    High cholesterol    Hyperlipidemia LDL goal <70    Mild aortic insufficiency    Osteoporosis    Pneumonia    Shortness of breath dyspnea    with exertion    Thyroid goiter    bx benign   Tobacco abuse    Vertigo     Patient Active Problem List   Diagnosis Date Noted   Syncope and collapse 06/13/2021   COVID-19 virus infection 06/13/2021   Acute on chronic respiratory failure with hypoxia ([place]) 06/13/2021   Pneumonia 05/16/2021   Mild aortic insufficiency 05/16/2021   Hemorrhoids 05/16/2021   Diverticulitis 41/93/7902   Complication of anesthesia 05/16/2021   Colon  polyps 05/16/2021   Cancer ([place]) 05/16/2021   Bronchitis 05/16/2021   Allergy 05/16/2021   Acute respiratory failure with hypoxia ([place]) 12/03/2018   COPD, group D, by GOLD 2017 classification ([place]) 10/03/2018   Overactive bladder 03/17/2018   Chronic respiratory failure ([place]) 10/04/2017   Vertigo 04/21/2017   Dyspnea 06/27/2016   Seborrheic keratoses 06/12/2016   Chest pain 06/12/2016   Coronary arteriosclerosis 09/26/2015   COPD exacerbation ([place]) 06/01/2013   Unspecified vitamin D deficiency 03/02/2010   Allergic rhinitis 03/02/2010   BACK PAIN, LUMBAR 03/02/2010   TROCHANTERIC BURSITIS, BILATERAL 03/02/2010   THYROID NODULE, RIGHT 11/09/2009   Hyperlipidemia 02/25/2009   OSTEOPOROSIS 01/30/2009   COMPRESSION FRACTURE, SPINE 01/21/2009   Goiter, unspecified 10/08/1997    Past Surgical History:  Procedure Laterality Date   ABDOMINAL HYSTERECTOMY     BILATERAL OOPHORECTOMY  2001   for benign ovarian mass    BIOPSY THYROID     DENTAL SURGERY     dentures   NASAL SEPTUM SURGERY     PARTIAL HYSTERECTOMY  1976   for heavy menses    RECTAL EXAM UNDER ANESTHESIA N/A 12/14/2015   Procedure: RECTAL EXAM UNDER ANESTHESIA REMOVAL OF ANAL CANAL MASS; INTERNAL HEMORRHOID LIGATION, EXTERNAL HEMORRHOID LIGATION;  Surgeon: Michael Boston, MD;  Location: [facility];  Service: General;  Laterality: N/A;   TUBAL LIGATION     WISDOM TOOTH EXTRACTION       OB History   No obstetric history on file.     Family History  Problem Relation Age of Onset   Heart disease Mother    COPD Father    Lung cancer Maternal Grandfather    Liver cancer Paternal Grandmother    Colon cancer Neg Hx    Colon polyps Neg Hx    Esophageal cancer Neg Hx    Rectal cancer Neg Hx    Stomach cancer Neg Hx     Social History   Tobacco Use   Smoking status: Former    Packs/day: 1.50    Years: 49.00    Pack years: 73.50    Types: Cigarettes    Quit date: 11/03/2018    Years since quitting: 2.7   Smokeless  tobacco: Never  Vaping Use   Vaping Use: Never used  Substance Use Topics   Alcohol use: No    Alcohol/week: 0.0 standard drinks   Drug use: No    Home Medications Prior to Admission medications   Medication Sig Start Date End Date Taking? Authorizing Provider  acetaminophen (TYLENOL) 500 MG tablet Take 1,500 mg by mouth every 4 (four) hours as needed for headache.     [provider]  albuterol (PROVENTIL) (2.5 MG/3ML) 0.083% nebulizer solution INHALE THE CONTENTS OF 1 VIAL VIA NEBULIZER EVERY 6 HOURS AS NEEDED FOR WHEEZING OR SHORTNESS OF BREATH Patient taking differently: Take 2.5 mg by nebulization every 6 (six) hours as needed for shortness of breath or wheezing. INHALE THE CONTENTS OF 1 VIAL VIA NEBULIZER EVERY 6 HOURS AS NEEDED FOR WHEEZING OR SHORTNESS OF BREATH 03/22/21   [name], MD  albuterol (VENTOLIN HFA) 108 (90 Base) MCG/ACT inhaler Inhale 2 puffs into the lungs every 6 (six) hours as needed for wheezing or shortness of breath. 02/11/20   [name], MD  aspirin (ASPIRIN ADULT LOW STRENGTH) 81 MG EC tablet TAKE 1 TABLET (81 MG TOTAL) BY MOUTH DAILY. Patient taking differently: Take 81 mg by mouth at bedtime. 02/11/20   [name], MD  atorvastatin (LIPITOR) 40 MG tablet Take 1 tablet (40 mg total) by mouth daily. Patient taking differently: Take 40 mg by mouth at bedtime. 11/22/20 06/13/21  Dunn, Nedra Hai, PA-C  azithromycin (ZITHROMAX) 250 MG tablet Take 2 tablets by mouth on day 1, then take 1 tablet daily until finished. 07/28/21   Mannam, Hart Robinsons, MD  Budeson-Glycopyrrol-Formoterol (BREZTRI AEROSPHERE) 160-9-4.8 MCG/ACT AERO Inhale 2 puffs by mouth into the lungs in the morning and at bedtime. 08/08/21   Martyn Ehrich, NP  fluticasone (FLONASE) 50 MCG/ACT nasal spray USE 2 SPRAYS IN EACH NOSTRIL EVERY DAY Patient taking differently: Place 2 sprays into both nostrils daily as needed for allergies. 02/11/20   [name], MD  guaiFENesin (MUCINEX) 600  MG 12 hr tablet Take 2 tablets (1,200 mg total) by mouth 2 (two) times daily. 08/08/21   Martyn Ehrich, NP  loratadine (CLARITIN) 10 MG tablet TAKE 1 TABLET (10 MG TOTAL) BY MOUTH DAILY. 12/17/18   Mannam, Hart Robinsons, MD  OXYGEN Inhale 2 L into the lungs daily.    [provider]  pantoprazole (PROTONIX) 40 MG tablet Take 1 tablet (40 mg total) by mouth daily. 06/18/21   Elgergawy, Silver Huguenin, MD  predniSONE (DELTASONE) 10 MG tablet Take 1 tablet by mouth daily until follow-up 08/08/21  Martyn Ehrich, NP    Allergies    Hydrocodone and Propoxyphene n-acetaminophen  Review of Systems   Review of Systems  Constitutional:  Negative for chills and fever.  HENT:  Negative for ear pain and sore throat.   Eyes:  Negative for pain and visual disturbance.  Respiratory:  Positive for cough and shortness of breath.   Cardiovascular:  Negative for chest pain and palpitations.  Gastrointestinal:  Negative for abdominal pain and vomiting.  Genitourinary:  Negative for dysuria and hematuria.  Musculoskeletal:  Negative for arthralgias and back pain.  Skin:  Negative for color change and rash.  Neurological:  Negative for seizures and syncope.  All other systems reviewed and are negative.  Physical Exam Updated Vital Signs BP (!) 143/63   Pulse 95   Temp 97.7 F (36.5 C)   Resp 18   Ht 1.626 m (5\' 4" )   Wt 52.6 kg   SpO2 100%   BMI 19.91 kg/m   Physical Exam Vitals and nursing note reviewed.  Constitutional:      General: She is not in acute distress.    Appearance: Normal appearance. She is well-developed.  HENT:     Head: Normocephalic and atraumatic.  Eyes:     Conjunctiva/sclera: Conjunctivae normal.     Pupils: Pupils are equal, round, and reactive to light.  Cardiovascular:     Rate and Rhythm: Normal rate and regular rhythm.     Heart sounds: No murmur heard. Pulmonary:     Effort: Pulmonary effort is normal. No respiratory distress.     Breath sounds: Rhonchi  present. No wheezing or rales.  Abdominal:     Palpations: Abdomen is soft.     Tenderness: There is no abdominal tenderness.  Musculoskeletal:        General: Normal range of motion.     Cervical back: Normal range of motion and neck supple.  Skin:    General: Skin is warm and dry.  Neurological:     General: No focal deficit present.     Mental Status: She is alert and oriented to person, place, and time.     Cranial Nerves: No cranial nerve deficit.     Sensory: No sensory deficit.     Motor: No weakness.    ED Results / Procedures / Treatments   Labs (all labs ordered are listed, but only abnormal results are displayed) Labs Reviewed  COMPREHENSIVE METABOLIC PANEL - Abnormal; Notable for the following components:      Result Value   Glucose, Bld 135 (*)    All other components within normal limits  CBC WITH DIFFERENTIAL/PLATELET - Abnormal; Notable for the following components:   RBC 3.79 (*)    Hemoglobin 10.0 (*)    HCT 33.5 (*)    MCHC 29.9 (*)    RDW 19.0 (*)    Platelets 417 (*)    All other components within normal limits  URINALYSIS, ROUTINE W REFLEX MICROSCOPIC - Abnormal; Notable for the following components:   Color, Urine STRAW (*)    All other components within normal limits  CULTURE, BLOOD (ROUTINE X 2)  CULTURE, BLOOD (ROUTINE X 2)  LACTIC ACID, PLASMA  PROTIME-INR    EKG None  Radiology DG Chest Port 1 View  Result Date: 08/13/2021 CLINICAL DATA:  Shortness of breath.  History of COPD.  Cough. EXAM: PORTABLE CHEST 1 VIEW COMPARISON:  Chest radiograph 07/12/2021; CT chest 06/13/2021 FINDINGS: The heart size and mediastinal contours are within  normal limits. Tortuous thoracic aorta with calcific atherosclerosis. Emphysematous changes in the upper lobes with fine reticulation at the lung bases. Chronic hyperinflation consistent with history of COPD. No focal consolidation, pleural effusion, or pneumothorax. No acute osseous abnormality. IMPRESSION: No  acute cardiopulmonary abnormality.  Stable changes of COPD. Aortic Atherosclerosis (ICD10-I70.0) and Emphysema (ICD10-J43.9). Electronically Signed   By: Ileana Roup M.D.   On: 08/13/2021 17:08    Procedures Procedures   Medications Ordered in ED Medications  ipratropium-albuterol (DUONEB) 0.5-2.5 (3) MG/3ML nebulizer solution 3 mL (3 mLs Nebulization Given 08/13/21 1808)    ED Course  I have reviewed the triage vital signs and the nursing notes.  Pertinent labs & imaging results that were available during my care of the patient were reviewed by me and considered in my medical decision making (see chart for details).    MDM Rules/Calculators/A&P                           On exam patient without any wheezing but did have some decreased air movement bilaterally.  Patient's oxygen saturations on her 2 L of oxygen were actually in the upper 90s.  Patient was a little tachycardic blood pressures were stable no fever.  Patient initially had some concern may be for sepsis.  Triage had started sepsis protocol.  Blood cultures were done obviously still pending.  Complete metabolic panel without any acute findings CO2 was 25.  No leukocytosis.  Hemoglobin down a little bit at 10.  Renal function was normal.  Urinalysis was negative.  Chest x-ray had no acute cardiopulmonary abnormalities.  Patient given albuterol Atrovent nebulizer here.  Says she feels much much better.  Chest x-ray again did not show any evidence of pneumonia or anything worse.  Does have stable changes of COPD.  Clinically it sounds as if patient is really struggling with bronchitis component.  But she feels well enough to go home.  And she will follow back up with pulmonary medicine she will start using her nebulizer treatments at home on a regular basis. Final Clinical Impression(s) / ED Diagnoses Final diagnoses:  COPD exacerbation [facility])    Rx / DC Orders ED Discharge Orders     None        Fredia Sorrow,  MD 08/13/21 1952

## 2021-08-13 NOTE — ED Notes (Signed)
Pt discharged by Charge RN Valorie, P

## 2021-08-13 NOTE — Discharge Instructions (Signed)
Increase your nebulizer treatments at home as planned.  Make an appointment to follow back up with pulmonary medicine.  Chest x-ray today without any new evidence of pneumonia.  Return for any new or worse symptoms.

## 2021-08-14 ENCOUNTER — Encounter: Payer: Self-pay | Admitting: Family Medicine

## 2021-08-15 ENCOUNTER — Other Ambulatory Visit: Payer: Self-pay | Admitting: Family Medicine

## 2021-08-15 DIAGNOSIS — Z1231 Encounter for screening mammogram for malignant neoplasm of breast: Secondary | ICD-10-CM

## 2021-08-15 DIAGNOSIS — Z1211 Encounter for screening for malignant neoplasm of colon: Secondary | ICD-10-CM

## 2021-08-17 DIAGNOSIS — D649 Anemia, unspecified: Secondary | ICD-10-CM | POA: Diagnosis not present

## 2021-08-17 DIAGNOSIS — J441 Chronic obstructive pulmonary disease with (acute) exacerbation: Secondary | ICD-10-CM | POA: Diagnosis not present

## 2021-08-17 DIAGNOSIS — J9621 Acute and chronic respiratory failure with hypoxia: Secondary | ICD-10-CM | POA: Diagnosis not present

## 2021-08-17 DIAGNOSIS — I700 Atherosclerosis of aorta: Secondary | ICD-10-CM | POA: Diagnosis not present

## 2021-08-17 DIAGNOSIS — M810 Age-related osteoporosis without current pathological fracture: Secondary | ICD-10-CM | POA: Diagnosis not present

## 2021-08-17 DIAGNOSIS — I350 Nonrheumatic aortic (valve) stenosis: Secondary | ICD-10-CM | POA: Diagnosis not present

## 2021-08-17 DIAGNOSIS — K219 Gastro-esophageal reflux disease without esophagitis: Secondary | ICD-10-CM | POA: Diagnosis not present

## 2021-08-17 DIAGNOSIS — U071 COVID-19: Secondary | ICD-10-CM | POA: Diagnosis not present

## 2021-08-17 DIAGNOSIS — E785 Hyperlipidemia, unspecified: Secondary | ICD-10-CM | POA: Diagnosis not present

## 2021-08-18 ENCOUNTER — Telehealth: Payer: Self-pay | Admitting: Family Medicine

## 2021-08-18 LAB — CULTURE, BLOOD (ROUTINE X 2)
Culture: NO GROWTH
Culture: NO GROWTH
Special Requests: ADEQUATE
Special Requests: ADEQUATE

## 2021-08-18 NOTE — Telephone Encounter (Signed)
Home Health Verbal Orders - Caller/Agency: [facility] Number: [phone]  Requesting OT/PT/Skilled Nursing/Social Work/Speech Therapy: Home Health & PT re-certification   Frequency: 1w8

## 2021-08-18 NOTE — Telephone Encounter (Signed)
Yes .. ok to give orders

## 2021-08-18 NOTE — Telephone Encounter (Signed)
Called back /orders given

## 2021-08-21 DIAGNOSIS — M810 Age-related osteoporosis without current pathological fracture: Secondary | ICD-10-CM | POA: Diagnosis not present

## 2021-08-21 DIAGNOSIS — J9621 Acute and chronic respiratory failure with hypoxia: Secondary | ICD-10-CM | POA: Diagnosis not present

## 2021-08-21 DIAGNOSIS — J441 Chronic obstructive pulmonary disease with (acute) exacerbation: Secondary | ICD-10-CM | POA: Diagnosis not present

## 2021-08-21 DIAGNOSIS — E785 Hyperlipidemia, unspecified: Secondary | ICD-10-CM | POA: Diagnosis not present

## 2021-08-21 DIAGNOSIS — I700 Atherosclerosis of aorta: Secondary | ICD-10-CM | POA: Diagnosis not present

## 2021-08-21 DIAGNOSIS — D649 Anemia, unspecified: Secondary | ICD-10-CM | POA: Diagnosis not present

## 2021-08-21 DIAGNOSIS — I350 Nonrheumatic aortic (valve) stenosis: Secondary | ICD-10-CM | POA: Diagnosis not present

## 2021-08-21 DIAGNOSIS — U071 COVID-19: Secondary | ICD-10-CM | POA: Diagnosis not present

## 2021-08-21 DIAGNOSIS — K219 Gastro-esophageal reflux disease without esophagitis: Secondary | ICD-10-CM | POA: Diagnosis not present

## 2021-08-24 DIAGNOSIS — K219 Gastro-esophageal reflux disease without esophagitis: Secondary | ICD-10-CM | POA: Diagnosis not present

## 2021-08-24 DIAGNOSIS — J441 Chronic obstructive pulmonary disease with (acute) exacerbation: Secondary | ICD-10-CM | POA: Diagnosis not present

## 2021-08-24 DIAGNOSIS — I350 Nonrheumatic aortic (valve) stenosis: Secondary | ICD-10-CM | POA: Diagnosis not present

## 2021-08-24 DIAGNOSIS — M810 Age-related osteoporosis without current pathological fracture: Secondary | ICD-10-CM | POA: Diagnosis not present

## 2021-08-24 DIAGNOSIS — U071 COVID-19: Secondary | ICD-10-CM | POA: Diagnosis not present

## 2021-08-24 DIAGNOSIS — I700 Atherosclerosis of aorta: Secondary | ICD-10-CM | POA: Diagnosis not present

## 2021-08-24 DIAGNOSIS — J9621 Acute and chronic respiratory failure with hypoxia: Secondary | ICD-10-CM | POA: Diagnosis not present

## 2021-08-24 DIAGNOSIS — E785 Hyperlipidemia, unspecified: Secondary | ICD-10-CM | POA: Diagnosis not present

## 2021-08-24 DIAGNOSIS — D649 Anemia, unspecified: Secondary | ICD-10-CM | POA: Diagnosis not present

## 2021-08-28 DIAGNOSIS — D649 Anemia, unspecified: Secondary | ICD-10-CM | POA: Diagnosis not present

## 2021-08-28 DIAGNOSIS — K219 Gastro-esophageal reflux disease without esophagitis: Secondary | ICD-10-CM | POA: Diagnosis not present

## 2021-08-28 DIAGNOSIS — I350 Nonrheumatic aortic (valve) stenosis: Secondary | ICD-10-CM | POA: Diagnosis not present

## 2021-08-28 DIAGNOSIS — M810 Age-related osteoporosis without current pathological fracture: Secondary | ICD-10-CM | POA: Diagnosis not present

## 2021-08-28 DIAGNOSIS — J441 Chronic obstructive pulmonary disease with (acute) exacerbation: Secondary | ICD-10-CM | POA: Diagnosis not present

## 2021-08-28 DIAGNOSIS — J9621 Acute and chronic respiratory failure with hypoxia: Secondary | ICD-10-CM | POA: Diagnosis not present

## 2021-08-28 DIAGNOSIS — I700 Atherosclerosis of aorta: Secondary | ICD-10-CM | POA: Diagnosis not present

## 2021-08-28 DIAGNOSIS — U071 COVID-19: Secondary | ICD-10-CM | POA: Diagnosis not present

## 2021-08-28 DIAGNOSIS — E785 Hyperlipidemia, unspecified: Secondary | ICD-10-CM | POA: Diagnosis not present

## 2021-09-05 ENCOUNTER — Encounter: Payer: Self-pay | Admitting: Family Medicine

## 2021-09-05 ENCOUNTER — Ambulatory Visit: Payer: Medicare HMO | Attending: Family Medicine | Admitting: Family Medicine

## 2021-09-05 ENCOUNTER — Other Ambulatory Visit (HOSPITAL_BASED_OUTPATIENT_CLINIC_OR_DEPARTMENT_OTHER): Payer: Self-pay

## 2021-09-05 ENCOUNTER — Other Ambulatory Visit: Payer: Self-pay

## 2021-09-05 VITALS — BP 122/73 | HR 111 | Ht 64.0 in | Wt 118.4 lb

## 2021-09-05 DIAGNOSIS — J9611 Chronic respiratory failure with hypoxia: Secondary | ICD-10-CM

## 2021-09-05 DIAGNOSIS — J449 Chronic obstructive pulmonary disease, unspecified: Secondary | ICD-10-CM

## 2021-09-05 MED ORDER — IPRATROPIUM-ALBUTEROL 0.5-2.5 (3) MG/3ML IN SOLN
3.0000 mL | Freq: Four times a day (QID) | RESPIRATORY_TRACT | 0 refills | Status: DC | PRN
  Filled 2021-09-05: qty 180, 15d supply, fill #0

## 2021-09-05 NOTE — Progress Notes (Signed)
Subjective:  Patient ID: Margaret Henry, female    DOB: Aug 21, 1951  Age: 70 y.o. MRN: [id number]  CC: Hospitalization Follow-up and Cough   HPI Margaret Henry is a 70 y.o. year old female with a history of COPD, previous tobacco abuse, GERD, hyperlipidemia, chronic respiratory failure (of 2 L of oxygen at rest).  Interval History: She had an ED visit for COPD exacerbation at [facility] on 08/14/2019.  Chest x-ray revealed no acute cardiopulmonary abnormality, aortic atherosclerosis, emphysema.  She does not have a follow-up appointment with pulmonary. Last visit with Pulmonary was on 08/08/21 and she was prescribed Breztri which did not work for her palpation and she had to switch back to albuterol and Breo inhalers. She complains she has a chronic cough with associated mucus which has not improved with Mucinex and Prednisone.  She does have dyspnea on mild exertion.  States this is her third round of prednisone. She did receive a nebulizer treatment at the ED which did help with her mucus and on review of her chart it was DuoNeb she received.  Past Medical History:  Diagnosis Date   Allergy    hayfever   Bronchitis    Cancer ([place])    skin cancer on chest   Colon polyps    Complication of anesthesia    pt states was given too much during nasal surgery 1989; difficulty getting awake   COPD (chronic obstructive pulmonary disease) ([place])    Coronary artery calcification    Diverticulitis    GERD (gastroesophageal reflux disease)    occasional   Hemorrhoids    High cholesterol    Hyperlipidemia LDL goal <70    Mild aortic insufficiency    Osteoporosis    Pneumonia    Shortness of breath dyspnea    with exertion    Thyroid goiter    bx benign   Tobacco abuse    Vertigo     Past Surgical History:  Procedure Laterality Date   ABDOMINAL HYSTERECTOMY     BILATERAL OOPHORECTOMY  2001   for benign ovarian mass    BIOPSY THYROID     DENTAL SURGERY     dentures   NASAL  SEPTUM SURGERY     PARTIAL HYSTERECTOMY  1976   for heavy menses    RECTAL EXAM UNDER ANESTHESIA N/A 12/14/2015   Procedure: RECTAL EXAM UNDER ANESTHESIA REMOVAL OF ANAL CANAL MASS; INTERNAL HEMORRHOID LIGATION, EXTERNAL HEMORRHOID LIGATION;  Surgeon: Michael Boston, MD;  Location: [facility];  Service: General;  Laterality: N/A;   TUBAL LIGATION     WISDOM TOOTH EXTRACTION      Family History  Problem Relation Age of Onset   Heart disease Mother    COPD Father    Lung cancer Maternal Grandfather    Liver cancer Paternal Grandmother    Colon cancer Neg Hx    Colon polyps Neg Hx    Esophageal cancer Neg Hx    Rectal cancer Neg Hx    Stomach cancer Neg Hx     Allergies  Allergen Reactions   Hydrocodone Nausea And Vomiting   Propoxyphene N-Acetaminophen Nausea And Vomiting    Outpatient Medications Prior to Visit  Medication Sig Dispense Refill   acetaminophen (TYLENOL) 500 MG tablet Take 1,500 mg by mouth every 4 (four) hours as needed for headache.      albuterol (VENTOLIN HFA) 108 (90 Base) MCG/ACT inhaler Inhale 2 puffs into the lungs every 6 (six) hours as needed  for wheezing or shortness of breath. 18 g 2   aspirin (ASPIRIN ADULT LOW STRENGTH) 81 MG EC tablet TAKE 1 TABLET (81 MG TOTAL) BY MOUTH DAILY. (Patient taking differently: Take 81 mg by mouth at bedtime.) 90 tablet 1   fluticasone (FLONASE) 50 MCG/ACT nasal spray USE 2 SPRAYS IN EACH NOSTRIL EVERY DAY (Patient taking differently: Place 2 sprays into both nostrils daily as needed for allergies.) 16 g 6   guaiFENesin (MUCINEX) 600 MG 12 hr tablet Take 2 tablets (1,200 mg total) by mouth 2 (two) times daily. 60 tablet 1   loratadine (CLARITIN) 10 MG tablet TAKE 1 TABLET (10 MG TOTAL) BY MOUTH DAILY. 100 tablet 2   OXYGEN Inhale 2 L into the lungs daily.     predniSONE (DELTASONE) 10 MG tablet Take 1 tablet by mouth daily until follow-up 30 tablet 0   albuterol (PROVENTIL) (2.5 MG/3ML) 0.083% nebulizer solution INHALE THE  CONTENTS OF 1 VIAL VIA NEBULIZER EVERY 6 HOURS AS NEEDED FOR WHEEZING OR SHORTNESS OF BREATH (Patient taking differently: Take 2.5 mg by nebulization every 6 (six) hours as needed for shortness of breath or wheezing. INHALE THE CONTENTS OF 1 VIAL VIA NEBULIZER EVERY 6 HOURS AS NEEDED FOR WHEEZING OR SHORTNESS OF BREATH) 90 mL 0   atorvastatin (LIPITOR) 40 MG tablet Take 1 tablet (40 mg total) by mouth daily. (Patient taking differently: Take 40 mg by mouth at bedtime.) 90 tablet 3   azithromycin (ZITHROMAX) 250 MG tablet Take 2 tablets by mouth on day 1, then take 1 tablet daily until finished. (Patient not taking: Reported on 09/05/2021) 6 tablet 0   Budeson-Glycopyrrol-Formoterol (BREZTRI AEROSPHERE) 160-9-4.8 MCG/ACT AERO Inhale 2 puffs by mouth into the lungs in the morning and at bedtime. 10.7 g 5   pantoprazole (PROTONIX) 40 MG tablet Take 1 tablet (40 mg total) by mouth daily. 30 tablet 0   No facility-administered medications prior to visit.     ROS Review of Systems  Constitutional:  Negative for activity change, appetite change and fatigue.  HENT:  Negative for congestion, sinus pressure and sore throat.   Eyes:  Negative for visual disturbance.  Respiratory:  Positive for cough and shortness of breath. Negative for chest tightness and wheezing.   Cardiovascular:  Negative for chest pain and palpitations.  Gastrointestinal:  Negative for abdominal distention, abdominal pain and constipation.  Endocrine: Negative for polydipsia.  Genitourinary:  Negative for dysuria and frequency.  Musculoskeletal:  Negative for arthralgias and back pain.  Skin:  Negative for rash.  Neurological:  Negative for tremors, light-headedness and numbness.  Hematological:  Does not bruise/bleed easily.  Psychiatric/Behavioral:  Negative for agitation and behavioral problems.    Objective:  BP 122/73   Pulse (!) 111   Ht 5\' 4"  (1.626 m)   Wt 118 lb 6.4 oz (53.7 kg)   SpO2 94%   BMI 20.32 kg/m    BP/Weight 09/05/2021 08/13/2021 11/12/4268  Systolic BP 623 762 831  Diastolic BP 73 63 62  Wt. (Lbs) 118.4 116 114.4  BMI 20.32 19.91 19.64      Physical Exam Constitutional:      Appearance: She is well-developed.  HENT:     Head:     Comments: On 2L oxygen at rest. Cardiovascular:     Rate and Rhythm: Tachycardia present.     Heart sounds: Normal heart sounds. No murmur heard. Pulmonary:     Effort: Pulmonary effort is normal.     Breath sounds: Normal breath  sounds. No wheezing or rales.  Chest:     Chest wall: No tenderness.  Abdominal:     General: Bowel sounds are normal. There is no distension.     Palpations: Abdomen is soft. There is no mass.     Tenderness: There is no abdominal tenderness.  Musculoskeletal:        General: Normal range of motion.     Right lower leg: No edema.     Left lower leg: No edema.  Neurological:     Mental Status: She is alert and oriented to person, place, and time.  Psychiatric:        Mood and Affect: Mood normal.    CMP Latest Ref Rng & Units 08/13/2021 06/18/2021 06/17/2021  Glucose 70 - 99 mg/dL 135(H) 107(H) 114(H)  BUN 8 - 23 mg/dL 17 16 21   Creatinine 0.44 - 1.00 mg/dL 0.56 0.55 0.64  Sodium 135 - 145 mmol/L 140 139 137  Potassium 3.5 - 5.1 mmol/L 4.5 4.1 4.6  Chloride 98 - 111 mmol/L 106 104 104  CO2 22 - 32 mmol/L 25 28 26   Calcium 8.9 - 10.3 mg/dL 9.3 9.1 9.8  Total Protein 6.5 - 8.1 g/dL 7.7 5.5(L) 6.6  Total Bilirubin 0.3 - 1.2 mg/dL 0.3 0.6 0.7  Alkaline Phos 38 - 126 U/L 68 35(L) 46  AST 15 - 41 U/L 19 18 23   ALT 0 - 44 U/L 14 14 15     Lipid Panel     Component Value Date/Time   CHOL 147 02/15/2021 1139   TRIG 130 06/13/2021 1623   HDL 58 02/15/2021 1139   CHOLHDL 2.5 02/15/2021 1139   CHOLHDL 3.1 09/05/2016 1218   VLDL 39 (H) 09/05/2016 1218   LDLCALC 70 02/15/2021 1139   LDLDIRECT 166 (H) 09/26/2015 1001    CBC    Component Value Date/Time   WBC 8.8 08/13/2021 1623   RBC 3.79 (L) 08/13/2021  1623   HGB 10.0 (L) 08/13/2021 1623   HGB 10.1 (L) 11/18/2020 1201   HCT 33.5 (L) 08/13/2021 1623   HCT 32.4 (L) 11/18/2020 1201   PLT 417 (H) 08/13/2021 1623   PLT 373 11/18/2020 1201   MCV 88.4 08/13/2021 1623   MCV 83 11/18/2020 1201   MCH 26.4 08/13/2021 1623   MCHC 29.9 (L) 08/13/2021 1623   RDW 19.0 (H) 08/13/2021 1623   RDW 15.4 11/18/2020 1201   LYMPHSABS 1.0 08/13/2021 1623   MONOABS 0.3 08/13/2021 1623   EOSABS 0.0 08/13/2021 1623   BASOSABS 0.0 08/13/2021 1623    Lab Results  Component Value Date   HGBA1C 5.9 (H) 02/15/2021    Assessment & Plan:  1. COPD, group D, by GOLD 2017 classification ([place]) Uncontrolled She has tried Trelegy, Librarian, academic in the past with no improvement in symptoms Will substitute albuterol nebulizer with DuoNeb She does have 3 days left of prednisone and has been advised to complete this Continue with Mucinex If symptoms persist she will need to reach out to her pulmonologist - ipratropium-albuterol (DUONEB) 0.5-2.5 (3) MG/3ML SOLN; Take 3 mLs by nebulization every 6 (six) hours as needed.  Dispense: 180 mL; Refill: 0  2. Chronic respiratory failure with hypoxia (H[facility]) Currently on 2 L of oxygen at rest    Meds ordered this encounter  Medications   ipratropium-albuterol (DUONEB) 0.5-2.5 (3) MG/3ML SOLN    Sig: Take 3 mLs by nebulization every 6 (six) hours as needed.    Dispense:  180 mL  Refill:  0     Follow-up: Return in about 6 months (around 03/05/2022) for Chronic medical conditions.       [name], MD, FAAFP. [facility] and [place], [place]   09/05/2021, 5:53 PM

## 2021-09-05 NOTE — Progress Notes (Signed)
States she still has cough.

## 2021-09-06 ENCOUNTER — Other Ambulatory Visit (HOSPITAL_BASED_OUTPATIENT_CLINIC_OR_DEPARTMENT_OTHER): Payer: Self-pay

## 2021-09-07 DIAGNOSIS — E785 Hyperlipidemia, unspecified: Secondary | ICD-10-CM | POA: Diagnosis not present

## 2021-09-07 DIAGNOSIS — K219 Gastro-esophageal reflux disease without esophagitis: Secondary | ICD-10-CM | POA: Diagnosis not present

## 2021-09-07 DIAGNOSIS — U071 COVID-19: Secondary | ICD-10-CM | POA: Diagnosis not present

## 2021-09-07 DIAGNOSIS — M810 Age-related osteoporosis without current pathological fracture: Secondary | ICD-10-CM | POA: Diagnosis not present

## 2021-09-07 DIAGNOSIS — I700 Atherosclerosis of aorta: Secondary | ICD-10-CM | POA: Diagnosis not present

## 2021-09-07 DIAGNOSIS — J441 Chronic obstructive pulmonary disease with (acute) exacerbation: Secondary | ICD-10-CM | POA: Diagnosis not present

## 2021-09-07 DIAGNOSIS — D649 Anemia, unspecified: Secondary | ICD-10-CM | POA: Diagnosis not present

## 2021-09-07 DIAGNOSIS — J9621 Acute and chronic respiratory failure with hypoxia: Secondary | ICD-10-CM | POA: Diagnosis not present

## 2021-09-07 DIAGNOSIS — I350 Nonrheumatic aortic (valve) stenosis: Secondary | ICD-10-CM | POA: Diagnosis not present

## 2021-09-14 DIAGNOSIS — I700 Atherosclerosis of aorta: Secondary | ICD-10-CM | POA: Diagnosis not present

## 2021-09-14 DIAGNOSIS — J9621 Acute and chronic respiratory failure with hypoxia: Secondary | ICD-10-CM | POA: Diagnosis not present

## 2021-09-14 DIAGNOSIS — E785 Hyperlipidemia, unspecified: Secondary | ICD-10-CM | POA: Diagnosis not present

## 2021-09-14 DIAGNOSIS — M810 Age-related osteoporosis without current pathological fracture: Secondary | ICD-10-CM | POA: Diagnosis not present

## 2021-09-14 DIAGNOSIS — U071 COVID-19: Secondary | ICD-10-CM | POA: Diagnosis not present

## 2021-09-14 DIAGNOSIS — J441 Chronic obstructive pulmonary disease with (acute) exacerbation: Secondary | ICD-10-CM | POA: Diagnosis not present

## 2021-09-14 DIAGNOSIS — D649 Anemia, unspecified: Secondary | ICD-10-CM | POA: Diagnosis not present

## 2021-09-14 DIAGNOSIS — K219 Gastro-esophageal reflux disease without esophagitis: Secondary | ICD-10-CM | POA: Diagnosis not present

## 2021-09-14 DIAGNOSIS — I350 Nonrheumatic aortic (valve) stenosis: Secondary | ICD-10-CM | POA: Diagnosis not present

## 2021-09-19 ENCOUNTER — Other Ambulatory Visit: Payer: Self-pay

## 2021-09-19 ENCOUNTER — Encounter: Payer: Self-pay | Admitting: Pulmonary Disease

## 2021-09-19 ENCOUNTER — Telehealth: Payer: Self-pay | Admitting: Pulmonary Disease

## 2021-09-19 ENCOUNTER — Ambulatory Visit (INDEPENDENT_AMBULATORY_CARE_PROVIDER_SITE_OTHER): Payer: Medicare HMO | Admitting: Pulmonary Disease

## 2021-09-19 ENCOUNTER — Other Ambulatory Visit (HOSPITAL_BASED_OUTPATIENT_CLINIC_OR_DEPARTMENT_OTHER): Payer: Self-pay

## 2021-09-19 VITALS — BP 130/62 | HR 101 | Temp 98.0°F | Ht 64.0 in | Wt 120.0 lb

## 2021-09-19 DIAGNOSIS — J441 Chronic obstructive pulmonary disease with (acute) exacerbation: Secondary | ICD-10-CM

## 2021-09-19 DIAGNOSIS — R059 Cough, unspecified: Secondary | ICD-10-CM | POA: Diagnosis not present

## 2021-09-19 MED ORDER — PREDNISONE 10 MG PO TABS
10.0000 mg | ORAL_TABLET | Freq: Every day | ORAL | 2 refills | Status: DC
  Filled 2021-09-19: qty 30, 30d supply, fill #0

## 2021-09-19 MED ORDER — AZITHROMYCIN 250 MG PO TABS
250.0000 mg | ORAL_TABLET | Freq: Every day | ORAL | 5 refills | Status: DC
  Filled 2021-09-19: qty 3, 3d supply, fill #0
  Filled 2021-09-19: qty 27, 27d supply, fill #0

## 2021-09-19 MED ORDER — SODIUM CHLORIDE 3 % IN NEBU
INHALATION_SOLUTION | Freq: Two times a day (BID) | RESPIRATORY_TRACT | 11 refills | Status: DC
  Filled 2021-09-19: qty 750, 25d supply, fill #0

## 2021-09-19 NOTE — Patient Instructions (Signed)
We will start you on a daily azithromycin to 250 mg a day Start prednisone 10 mg a day Continue inhalers  Use the flutter valve every day.  Will add hypertonic saline nebs twice a day to mobilize the secretions Follow-up in 1 month

## 2021-09-19 NOTE — Progress Notes (Signed)
Margaret Henry    [id number]    [birth year]  Primary Care Physician:Newlin, Charlane Ferretti, MD  Referring Physician: [name], MD [place],  [place] [id number]  Chief complaint:   Follow up for COPD GOLD D  HPI: 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 Her symptoms consist mainly of shortness of breath, daily cough with whitish sputum production, wheeze, dyspnea and exertion. She does not have any hemoptysis, chest pain, palpitations. No fevers, chills.   She is currently maintained on Spiriva and breo.  Tried on Trelegy inhaler in 2019 but had to go back to [place] and Spiriva since it made her breathing worse.  Also tried on Daliresp in 2019 but had to stop due to side effects of nausea  Continues on Breo and Spiriva She had tried Trelegy again in early 2022 but it made her breathing worse She got a course of prednisone in March 2022 for mild COPD exacerbation.  She had smoked one pack per day for nearly 25 years. Quit in 2020. 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 She does not recall any particular exposures at work or at home.  Interim history: She has been struggling with recurrent COPD exacerbations for the past 3 months Had bilateral pneumonia on chest x-ray primary care in September 2022 and was given a course of Augmentin, levofloxacin with minimal improvement in symptoms.  She continues to struggle with daily cough.  She received several rounds of antibiotics and additional Z-Pak through our office Given breztri from the office but she did not like it and is back to [place] and Spiriva  Evaluated in the ED on 10/13/2020 with chest x-ray showing clear lungs   Outpatient Encounter Medications as of 09/19/2021  Medication Sig   acetaminophen (TYLENOL) 500 MG tablet Take 1,500 mg by mouth every 4 (four) hours as needed for headache.    albuterol (VENTOLIN  HFA) 108 (90 Base) MCG/ACT inhaler Inhale 2 puffs into the lungs every 6 (six) hours as needed for wheezing or shortness of breath.   aspirin (ASPIRIN ADULT LOW STRENGTH) 81 MG EC tablet TAKE 1 TABLET (81 MG TOTAL) BY MOUTH DAILY. (Patient taking differently: Take 81 mg by mouth at bedtime.)   fluticasone (FLONASE) 50 MCG/ACT nasal spray USE 2 SPRAYS IN EACH NOSTRIL EVERY DAY (Patient taking differently: Place 2 sprays into both nostrils daily as needed for allergies.)   fluticasone furoate-vilanterol (BREO ELLIPTA) 200-25 MCG/ACT AEPB Inhale 1 puff into the lungs daily.   ipratropium-albuterol (DUONEB) 0.5-2.5 (3) MG/3ML SOLN Take 3 mLs by nebulization every 6 (six) hours as needed.   loratadine (CLARITIN) 10 MG tablet TAKE 1 TABLET (10 MG TOTAL) BY MOUTH DAILY.   OXYGEN Inhale 2 L into the lungs daily.   Tiotropium Bromide Monohydrate (SPIRIVA RESPIMAT) 2.5 MCG/ACT AERS Inhale 2 puffs into the lungs daily.   atorvastatin (LIPITOR) 40 MG tablet Take 1 tablet (40 mg total) by mouth daily. (Patient taking differently: Take 40 mg by mouth at bedtime.)   azithromycin (ZITHROMAX) 250 MG tablet Take 2 tablets by mouth on day 1, then take 1 tablet daily until finished. (Patient not taking: Reported on 09/05/2021)   guaiFENesin (MUCINEX) 600 MG 12 hr tablet Take 2 tablets (1,200 mg total) by mouth 2 (two) times daily.   predniSONE (DELTASONE) 10 MG tablet Take 1 tablet by mouth  daily until follow-up   No facility-administered encounter medications on file as of 09/19/2021.   Physical Exam: Blood pressure 130/62, pulse (!) 101, temperature 98 F (36.7 C), temperature source Oral, height 5\' 4"  (1.626 m), weight 120 lb (54.4 kg), SpO2 95 %. Gen:      No acute distress HEENT:  EOMI, sclera anicteric Neck:     No masses; no thyromegaly Lungs:    Clear to auscultation bilaterally; normal respiratory effort CV:         Regular rate and rhythm; no murmurs Abd:      + bowel sounds; soft, non-tender; no  palpable masses, no distension Ext:    No edema; adequate peripheral perfusion Skin:      Warm and dry; no rash Neuro: alert and oriented x 3 Psych: normal mood and affect   Data Reviewed: Imaging: Screening CT chest 03/07/18- calcified right thyroid nodule.  Severe centrilobular emphysema, bronchial wall thickening.  Previously described 4.8 mm right lower lobe nodule now measures 2.9 mm.  Granulomatous splenic and liver calcification.  CT 10/20/2019-resolution of left lower lobe pulmonary nodule, no other lung abnormality   Low-dose screening CT 10/20/2020-new 5.9 mm right lower lobe pulmonary nodule, emphysema, atherosclerosis, gallstones.  Low-dose screening CT 04/24/2021-improvement of right lower lobe pulmonary nodule, emphysema  Chest x-ray 06/30/2021-bilateral infiltrates Chest x-ray 07/12/2021-improvement in infiltrates Chest x-ray 08/13/2021-clear lungs I have reviewed the images personally.    PFTs (07/21/15) FVC 2.06 66%), FEV1 0.93 (39%), F/F 45, DLCO 36% Unable complete pleth. Severe obstructive lung disease, severe diffusion impairment  Spirometry 04/12/16 FVC 1.79 [5 7%), FEV1 0.65 (27%), F/F 36 Very severe obstructive airway disease  Labs: CBC 12/03/2018-WBC 9.2, eos 0% ID 10/16/18-1931 Alpha-1 antitrypsin 11/05/2018-171, PI MM  Assessment:  Severe COPD,Gold Stage D, Chronic bronchitis Continues on Breo and Spiriva.   I am not sure if she is generating enough inspiratory flow for the inhalers.  We discussed switching over to nebulizers but she would like to hold off on this change for now. Has not tolerated Daliresp due to side effects Continue supplemental oxygen She has finished pulmonary rehab 2 times already.  Encouraged her to stay active at home  Unfortunately she is suffering with recurrent exacerbations over the past 3 months.  She is already received multiple rounds of antibiotics with recent clear x-ray.  Will avoid further antibiotic use Start daily  azithromycin, prednisone 10 mg a day Codeine cough syrup for persistent cough Increased use flutter valve daily Start hypertonic saline to help with mucociliary clearance  Pulmonary nodule Continue annual low-dose screening CT  Plan/Recommendations: - Continue Breo, Spiriva - Daily azithromycin, prednisone - Cough syrup - Flutter valve, hypertonic saline  Marshell Garfinkel MD [facility] 09/19/2021, 11:35 AM  CC: [name], MD

## 2021-09-19 NOTE — Telephone Encounter (Signed)
Dr. Vaughan Browner, please advise on this for pt about the cough syrup.

## 2021-09-20 ENCOUNTER — Other Ambulatory Visit (HOSPITAL_BASED_OUTPATIENT_CLINIC_OR_DEPARTMENT_OTHER): Payer: Self-pay

## 2021-09-20 DIAGNOSIS — D649 Anemia, unspecified: Secondary | ICD-10-CM | POA: Diagnosis not present

## 2021-09-20 DIAGNOSIS — J441 Chronic obstructive pulmonary disease with (acute) exacerbation: Secondary | ICD-10-CM | POA: Diagnosis not present

## 2021-09-20 DIAGNOSIS — U071 COVID-19: Secondary | ICD-10-CM | POA: Diagnosis not present

## 2021-09-20 DIAGNOSIS — I700 Atherosclerosis of aorta: Secondary | ICD-10-CM | POA: Diagnosis not present

## 2021-09-20 DIAGNOSIS — J9621 Acute and chronic respiratory failure with hypoxia: Secondary | ICD-10-CM | POA: Diagnosis not present

## 2021-09-20 DIAGNOSIS — M810 Age-related osteoporosis without current pathological fracture: Secondary | ICD-10-CM | POA: Diagnosis not present

## 2021-09-20 DIAGNOSIS — I350 Nonrheumatic aortic (valve) stenosis: Secondary | ICD-10-CM | POA: Diagnosis not present

## 2021-09-20 DIAGNOSIS — K219 Gastro-esophageal reflux disease without esophagitis: Secondary | ICD-10-CM | POA: Diagnosis not present

## 2021-09-20 DIAGNOSIS — E785 Hyperlipidemia, unspecified: Secondary | ICD-10-CM | POA: Diagnosis not present

## 2021-09-20 MED ORDER — HYDROCODONE BIT-HOMATROP MBR 5-1.5 MG/5ML PO SOLN
5.0000 mL | Freq: Four times a day (QID) | ORAL | 0 refills | Status: DC | PRN
  Filled 2021-09-20: qty 240, 12d supply, fill #0

## 2021-09-20 NOTE — Telephone Encounter (Signed)
It has been sent into the pharmacy

## 2021-09-20 NOTE — Telephone Encounter (Signed)
Called and spoke with patient. She is aware that the cough syrup has been sent in for her.   Nothing further needed at time of call.

## 2021-09-21 ENCOUNTER — Other Ambulatory Visit (HOSPITAL_BASED_OUTPATIENT_CLINIC_OR_DEPARTMENT_OTHER): Payer: Self-pay

## 2021-09-21 DIAGNOSIS — J441 Chronic obstructive pulmonary disease with (acute) exacerbation: Secondary | ICD-10-CM | POA: Diagnosis not present

## 2021-09-21 DIAGNOSIS — J9621 Acute and chronic respiratory failure with hypoxia: Secondary | ICD-10-CM | POA: Diagnosis not present

## 2021-09-21 DIAGNOSIS — K219 Gastro-esophageal reflux disease without esophagitis: Secondary | ICD-10-CM | POA: Diagnosis not present

## 2021-09-21 DIAGNOSIS — U071 COVID-19: Secondary | ICD-10-CM | POA: Diagnosis not present

## 2021-09-21 DIAGNOSIS — I350 Nonrheumatic aortic (valve) stenosis: Secondary | ICD-10-CM | POA: Diagnosis not present

## 2021-09-21 DIAGNOSIS — M810 Age-related osteoporosis without current pathological fracture: Secondary | ICD-10-CM | POA: Diagnosis not present

## 2021-09-21 DIAGNOSIS — E785 Hyperlipidemia, unspecified: Secondary | ICD-10-CM | POA: Diagnosis not present

## 2021-09-21 DIAGNOSIS — I700 Atherosclerosis of aorta: Secondary | ICD-10-CM | POA: Diagnosis not present

## 2021-09-21 DIAGNOSIS — D649 Anemia, unspecified: Secondary | ICD-10-CM | POA: Diagnosis not present

## 2021-09-25 ENCOUNTER — Other Ambulatory Visit (HOSPITAL_BASED_OUTPATIENT_CLINIC_OR_DEPARTMENT_OTHER): Payer: Self-pay

## 2021-09-26 ENCOUNTER — Other Ambulatory Visit: Payer: Self-pay

## 2021-09-26 ENCOUNTER — Ambulatory Visit (HOSPITAL_BASED_OUTPATIENT_CLINIC_OR_DEPARTMENT_OTHER)
Admission: RE | Admit: 2021-09-26 | Discharge: 2021-09-26 | Disposition: A | Discharge status: Home / Self Care | Payer: Medicare HMO | Source: Ambulatory Visit | Attending: Family Medicine | Admitting: Family Medicine

## 2021-09-26 ENCOUNTER — Encounter (HOSPITAL_BASED_OUTPATIENT_CLINIC_OR_DEPARTMENT_OTHER): Payer: Self-pay

## 2021-09-26 DIAGNOSIS — Z1231 Encounter for screening mammogram for malignant neoplasm of breast: Secondary | ICD-10-CM | POA: Insufficient documentation

## 2021-10-03 DIAGNOSIS — J441 Chronic obstructive pulmonary disease with (acute) exacerbation: Secondary | ICD-10-CM | POA: Diagnosis not present

## 2021-10-03 DIAGNOSIS — E785 Hyperlipidemia, unspecified: Secondary | ICD-10-CM | POA: Diagnosis not present

## 2021-10-03 DIAGNOSIS — D649 Anemia, unspecified: Secondary | ICD-10-CM | POA: Diagnosis not present

## 2021-10-03 DIAGNOSIS — J9621 Acute and chronic respiratory failure with hypoxia: Secondary | ICD-10-CM | POA: Diagnosis not present

## 2021-10-03 DIAGNOSIS — M810 Age-related osteoporosis without current pathological fracture: Secondary | ICD-10-CM | POA: Diagnosis not present

## 2021-10-03 DIAGNOSIS — I350 Nonrheumatic aortic (valve) stenosis: Secondary | ICD-10-CM | POA: Diagnosis not present

## 2021-10-03 DIAGNOSIS — K219 Gastro-esophageal reflux disease without esophagitis: Secondary | ICD-10-CM | POA: Diagnosis not present

## 2021-10-03 DIAGNOSIS — I700 Atherosclerosis of aorta: Secondary | ICD-10-CM | POA: Diagnosis not present

## 2021-10-03 DIAGNOSIS — U071 COVID-19: Secondary | ICD-10-CM | POA: Diagnosis not present

## 2021-10-05 ENCOUNTER — Telehealth: Payer: Self-pay | Admitting: *Deleted

## 2021-10-05 NOTE — Telephone Encounter (Signed)
I do not understand.  I do not see any message attached here.

## 2021-10-12 DIAGNOSIS — J441 Chronic obstructive pulmonary disease with (acute) exacerbation: Secondary | ICD-10-CM | POA: Diagnosis not present

## 2021-10-12 DIAGNOSIS — M810 Age-related osteoporosis without current pathological fracture: Secondary | ICD-10-CM | POA: Diagnosis not present

## 2021-10-12 DIAGNOSIS — J9621 Acute and chronic respiratory failure with hypoxia: Secondary | ICD-10-CM | POA: Diagnosis not present

## 2021-10-12 DIAGNOSIS — I700 Atherosclerosis of aorta: Secondary | ICD-10-CM | POA: Diagnosis not present

## 2021-10-12 DIAGNOSIS — K219 Gastro-esophageal reflux disease without esophagitis: Secondary | ICD-10-CM | POA: Diagnosis not present

## 2021-10-12 DIAGNOSIS — E785 Hyperlipidemia, unspecified: Secondary | ICD-10-CM | POA: Diagnosis not present

## 2021-10-12 DIAGNOSIS — U071 COVID-19: Secondary | ICD-10-CM | POA: Diagnosis not present

## 2021-10-12 DIAGNOSIS — D649 Anemia, unspecified: Secondary | ICD-10-CM | POA: Diagnosis not present

## 2021-10-12 DIAGNOSIS — I350 Nonrheumatic aortic (valve) stenosis: Secondary | ICD-10-CM | POA: Diagnosis not present

## 2021-10-18 DIAGNOSIS — I350 Nonrheumatic aortic (valve) stenosis: Secondary | ICD-10-CM | POA: Diagnosis not present

## 2021-10-18 DIAGNOSIS — K219 Gastro-esophageal reflux disease without esophagitis: Secondary | ICD-10-CM | POA: Diagnosis not present

## 2021-10-18 DIAGNOSIS — I700 Atherosclerosis of aorta: Secondary | ICD-10-CM | POA: Diagnosis not present

## 2021-10-18 DIAGNOSIS — J441 Chronic obstructive pulmonary disease with (acute) exacerbation: Secondary | ICD-10-CM | POA: Diagnosis not present

## 2021-10-18 DIAGNOSIS — M810 Age-related osteoporosis without current pathological fracture: Secondary | ICD-10-CM | POA: Diagnosis not present

## 2021-10-18 DIAGNOSIS — J9621 Acute and chronic respiratory failure with hypoxia: Secondary | ICD-10-CM | POA: Diagnosis not present

## 2021-10-18 DIAGNOSIS — E785 Hyperlipidemia, unspecified: Secondary | ICD-10-CM | POA: Diagnosis not present

## 2021-10-18 DIAGNOSIS — U071 COVID-19: Secondary | ICD-10-CM | POA: Diagnosis not present

## 2021-10-18 DIAGNOSIS — D649 Anemia, unspecified: Secondary | ICD-10-CM | POA: Diagnosis not present

## 2021-10-24 ENCOUNTER — Ambulatory Visit: Payer: Medicare HMO | Admitting: Pulmonary Disease

## 2021-10-31 ENCOUNTER — Other Ambulatory Visit (HOSPITAL_BASED_OUTPATIENT_CLINIC_OR_DEPARTMENT_OTHER): Payer: Self-pay

## 2021-10-31 ENCOUNTER — Ambulatory Visit (INDEPENDENT_AMBULATORY_CARE_PROVIDER_SITE_OTHER): Payer: Medicare HMO | Admitting: Pulmonary Disease

## 2021-10-31 ENCOUNTER — Encounter: Payer: Self-pay | Admitting: Pulmonary Disease

## 2021-10-31 ENCOUNTER — Other Ambulatory Visit: Payer: Self-pay

## 2021-10-31 VITALS — BP 130/62 | HR 102 | Temp 98.2°F | Ht 64.0 in | Wt 123.8 lb

## 2021-10-31 DIAGNOSIS — J449 Chronic obstructive pulmonary disease, unspecified: Secondary | ICD-10-CM | POA: Diagnosis not present

## 2021-10-31 MED ORDER — AZITHROMYCIN 250 MG PO TABS
250.0000 mg | ORAL_TABLET | Freq: Every day | ORAL | 3 refills | Status: DC
  Filled 2021-10-31: qty 30, 30d supply, fill #0
  Filled 2021-11-28: qty 30, 30d supply, fill #1
  Filled 2022-01-02: qty 30, 30d supply, fill #2
  Filled 2022-01-31: qty 30, 30d supply, fill #3

## 2021-10-31 MED ORDER — PREDNISONE 10 MG PO TABS
10.0000 mg | ORAL_TABLET | Freq: Every day | ORAL | 3 refills | Status: DC
  Filled 2021-10-31: qty 30, 30d supply, fill #0
  Filled 2021-11-28: qty 30, 30d supply, fill #1
  Filled 2022-01-02: qty 30, 30d supply, fill #2
  Filled 2022-01-31: qty 30, 30d supply, fill #3

## 2021-10-31 NOTE — Patient Instructions (Addendum)
We will resume the azithromycin to 250 mg a day and prednisone 10 mg a day.  Continue this indefinitely.  With We will call in 3 refills for each of these medication Continue Breo, Spiriva and flutter valve Follow-up in 3 to 4 months

## 2021-10-31 NOTE — Progress Notes (Signed)
Margaret Henry    [id number]    [birth year]  Primary Care Physician:Newlin, Charlane Ferretti, MD  Referring Physician: [name], MD [place],  [place] [address]  Chief complaint:   Follow up for COPD GOLD D  HPI: 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 Her symptoms consist mainly of shortness of breath, daily cough with whitish sputum production, wheeze, dyspnea and exertion. She does not have any hemoptysis, chest pain, palpitations. No fevers, chills.   She is currently maintained on Spiriva and breo.  Tried on Trelegy inhaler in 2019 but had to go back to [place] and Spiriva since it made her breathing worse.  Also tried on Daliresp in 2019 but had to stop due to side effects of nausea  Continues on Breo and Spiriva She had tried Trelegy again in early 2022 but it made her breathing worse She got a course of prednisone in March 2022 for mild COPD exacerbation.  She has been struggling with recurrent COPD exacerbations for the past 3 months Had bilateral pneumonia on chest x-ray primary care in September 2022 and was given a course of Augmentin, levofloxacin with minimal improvement in symptoms.  She continues to struggle with daily cough.  She received several rounds of antibiotics and additional Z-Pak through our office Given breztri from the office but she did not like it and is back to [place] and Spiriva  Evaluated in the ED on 08/13/2021 with chest x-ray showing clear lungs  She had smoked one pack per day for nearly 25 years. Quit in 2020. 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 She does not recall any particular exposures at work or at home.  Interim history: Started on chronic prednisone and azithromycin 250 mg at last visit for recurrent exacerbations.  She initially felt better but did not renew the medication after few weeks and has worsening  dyspnea  Complains of chronic dyspnea on exertion, cough with white mucus production.  Outpatient Encounter Medications as of 10/31/2021  Medication Sig   acetaminophen (TYLENOL) 500 MG tablet Take 1,500 mg by mouth every 4 (four) hours as needed for headache.    albuterol (VENTOLIN HFA) 108 (90 Base) MCG/ACT inhaler Inhale 2 puffs into the lungs every 6 (six) hours as needed for wheezing or shortness of breath.   aspirin (ASPIRIN ADULT LOW STRENGTH) 81 MG EC tablet TAKE 1 TABLET (81 MG TOTAL) BY MOUTH DAILY. (Patient taking differently: Take 81 mg by mouth at bedtime.)   azithromycin (ZITHROMAX) 250 MG tablet Take 1 tablet (250 mg total) by mouth daily.   fluticasone (FLONASE) 50 MCG/ACT nasal spray USE 2 SPRAYS IN EACH NOSTRIL EVERY DAY (Patient taking differently: Place 2 sprays into both nostrils daily as needed for allergies.)   fluticasone furoate-vilanterol (BREO ELLIPTA) 200-25 MCG/ACT AEPB Inhale 1 puff into the lungs daily.   HYDROcodone bit-homatropine (HYCODAN) 5-1.5 MG/5ML syrup Take 5 mLs by mouth every 6 (six) hours as needed for cough.   ipratropium-albuterol (DUONEB) 0.5-2.5 (3) MG/3ML SOLN Take 3 mLs by nebulization every 6 (six) hours as needed.   loratadine (CLARITIN) 10 MG tablet TAKE 1 TABLET (10 MG TOTAL) BY MOUTH DAILY.   OXYGEN Inhale 2.5 L into the lungs daily.   predniSONE (DELTASONE) 10 MG tablet Take 1 tablet (10 mg total) by mouth daily with breakfast.   sodium chloride HYPERTONIC  3 % nebulizer solution Take by nebulization 2 (two) times daily.   Tiotropium Bromide Monohydrate (SPIRIVA RESPIMAT) 2.5 MCG/ACT AERS Inhale 2 puffs into the lungs daily.   atorvastatin (LIPITOR) 40 MG tablet Take 1 tablet (40 mg total) by mouth daily. (Patient taking differently: Take 40 mg by mouth at bedtime.)   guaiFENesin (MUCINEX) 600 MG 12 hr tablet Take 2 tablets (1,200 mg total) by mouth 2 (two) times daily.   No facility-administered encounter medications on file as of 10/31/2021.    Physical Exam: Blood pressure 130/62, pulse (!) 102, temperature 98.2 F (36.8 C), temperature source Oral, height 5\' 4"  (1.626 m), weight 123 lb 12.8 oz (56.2 kg), SpO2 98 %. Gen:      No acute distress HEENT:  EOMI, sclera anicteric Neck:     No masses; no thyromegaly Lungs:    Clear to auscultation bilaterally; normal respiratory effort CV:         Regular rate and rhythm; no murmurs Abd:      + bowel sounds; soft, non-tender; no palpable masses, no distension Ext:    No edema; adequate peripheral perfusion Skin:      Warm and dry; no rash Neuro: alert and oriented x 3 Psych: normal mood and affect   Data Reviewed: Imaging: Screening CT chest 03/07/18- calcified right thyroid nodule.  Severe centrilobular emphysema, bronchial wall thickening.  Previously described 4.8 mm right lower lobe nodule now measures 2.9 mm.  Granulomatous splenic and liver calcification.  CT 10/20/2019-resolution of left lower lobe pulmonary nodule, no other lung abnormality   Low-dose screening CT 10/20/2020-new 5.9 mm right lower lobe pulmonary nodule, emphysema, atherosclerosis, gallstones.  Low-dose screening CT 04/24/2021-improvement of right lower lobe pulmonary nodule, emphysema  Chest x-ray 06/30/2021-bilateral infiltrates Chest x-ray 07/12/2021-improvement in infiltrates Chest x-ray 08/13/2021-clear lungs I have reviewed the images personally.  PFTs (07/21/15) FVC 2.06 66%), FEV1 0.93 (39%), F/F 45, DLCO 36% Unable complete pleth. Severe obstructive lung disease, severe diffusion impairment  Spirometry 04/12/16 FVC 1.79 [5 7%), FEV1 0.65 (27%), F/F 36 Very severe obstructive airway disease  Labs: CBC 12/03/2018-WBC 9.2, eos 0% ID 10/16/18-1931 Alpha-1 antitrypsin 11/05/2018-171, PI MM  Assessment:  Severe COPD,Gold Stage D, Chronic bronchitis Continues on Breo and Spiriva.   I am not sure if she is generating enough inspiratory flow for the inhalers.  We discussed switching over to nebulizers  but she would like to hold off on this change for now. Has not tolerated Daliresp due to side effects Continue supplemental oxygen She has finished pulmonary rehab 2 times already.  Encouraged her to stay active at home  Unfortunately she is suffering with recurrent exacerbations over the past 3 months.  She is already received multiple rounds of antibiotics with recent clear x-ray.  Will avoid further antibiotic use Resume daily azithromycin, prednisone 10 mg a day Codeine cough syrup for persistent cough Continue flutter valve daily and hypertonic saline to help with mucociliary clearance  Pulmonary nodule Continue annual low-dose screening CT  Plan/Recommendations: - Continue Breo, Spiriva - Daily azithromycin, prednisone - Cough syrup - Flutter valve, hypertonic saline  Marshell Garfinkel MD [facility] 10/31/2021, 3:10 PM  CC: [name], MD

## 2021-11-01 ENCOUNTER — Other Ambulatory Visit (HOSPITAL_BASED_OUTPATIENT_CLINIC_OR_DEPARTMENT_OTHER): Payer: Self-pay

## 2021-11-01 ENCOUNTER — Encounter: Payer: Self-pay | Admitting: Pulmonary Disease

## 2021-11-28 ENCOUNTER — Other Ambulatory Visit (HOSPITAL_BASED_OUTPATIENT_CLINIC_OR_DEPARTMENT_OTHER): Payer: Self-pay

## 2021-11-28 ENCOUNTER — Other Ambulatory Visit: Payer: Self-pay

## 2021-11-28 ENCOUNTER — Ambulatory Visit (INDEPENDENT_AMBULATORY_CARE_PROVIDER_SITE_OTHER): Payer: Medicare HMO | Admitting: Cardiology

## 2021-11-28 ENCOUNTER — Encounter: Payer: Self-pay | Admitting: Cardiology

## 2021-11-28 VITALS — BP 120/70 | HR 104 | Ht 64.0 in | Wt 123.0 lb

## 2021-11-28 DIAGNOSIS — J449 Chronic obstructive pulmonary disease, unspecified: Secondary | ICD-10-CM | POA: Diagnosis not present

## 2021-11-28 DIAGNOSIS — R0609 Other forms of dyspnea: Secondary | ICD-10-CM | POA: Diagnosis not present

## 2021-11-28 DIAGNOSIS — I2510 Atherosclerotic heart disease of native coronary artery without angina pectoris: Secondary | ICD-10-CM

## 2021-11-28 DIAGNOSIS — I351 Nonrheumatic aortic (valve) insufficiency: Secondary | ICD-10-CM | POA: Diagnosis not present

## 2021-11-28 DIAGNOSIS — J9611 Chronic respiratory failure with hypoxia: Secondary | ICD-10-CM

## 2021-11-28 NOTE — Progress Notes (Signed)
Cardiology Office Note:    Date:  11/28/2021   ID:  Margaret Henry, DOB 04-08-1951, MRN [id number]  PCP:  Margaret Rakes, MD  Cardiologist:  Margaret Campus, MD    Referring MD: Margaret Rakes, MD   No chief complaint on file. I was sick with pneumonia  History of Present Illness:    Margaret Henry is a 71 y.o. female with past medical history significant for advanced COPD.  She is oxygen dependent is also steroid-dependent.  She has been follow-up excellently by pulmonary.  Also history of coronary calcification initially diagnosed in 2019 shortly after that she had a stress test done which showed no evidence of ischemia.  Since that time the key is risk factors modifications.  Assessment overall is difficult based on clinical presentation because of advanced COPD, she does have a lot of shortness of breath.  She does have cough and sputum production.  Denies have any tightness squeezing pressure burning chest.  Does not do much because of COPD.  She tells me that she goes on to doctors offices and tests otherwise she does not leave her house.  Past Medical History:  Diagnosis Date   Allergy    hayfever   Bronchitis    Cancer ([facility])    skin cancer on chest   Colon polyps    Complication of anesthesia    pt states was given too much during nasal surgery 1989; difficulty getting awake   COPD (chronic obstructive pulmonary disease) (HCC)    Coronary artery calcification    Diverticulitis    GERD (gastroesophageal reflux disease)    occasional   Hemorrhoids    High cholesterol    Hyperlipidemia LDL goal <70    Mild aortic insufficiency    Osteoporosis    Pneumonia    Shortness of breath dyspnea    with exertion    Thyroid goiter    bx benign   Tobacco abuse    Vertigo     Past Surgical History:  Procedure Laterality Date   ABDOMINAL HYSTERECTOMY     BILATERAL OOPHORECTOMY  2001   for benign ovarian mass    BIOPSY THYROID     DENTAL SURGERY     dentures   NASAL  SEPTUM SURGERY     PARTIAL HYSTERECTOMY  1976   for heavy menses    RECTAL EXAM UNDER ANESTHESIA N/A 12/14/2015   Procedure: RECTAL EXAM UNDER ANESTHESIA REMOVAL OF ANAL CANAL MASS; INTERNAL HEMORRHOID LIGATION, EXTERNAL HEMORRHOID LIGATION;  Surgeon: Michael Boston, MD;  Location: [facility];  Service: General;  Laterality: N/A;   TUBAL LIGATION     WISDOM TOOTH EXTRACTION      Current Medications: Current Meds  Medication Sig   acetaminophen (TYLENOL) 500 MG tablet Take 1,500 mg by mouth every 4 (four) hours as needed for headache.    albuterol (VENTOLIN HFA) 108 (90 Base) MCG/ACT inhaler Inhale 2 puffs into the lungs every 6 (six) hours as needed for wheezing or shortness of breath.   aspirin (ASPIRIN ADULT LOW STRENGTH) 81 MG EC tablet TAKE 1 TABLET (81 MG TOTAL) BY MOUTH DAILY. (Patient taking differently: Take 81 mg by mouth at bedtime.)   atorvastatin (LIPITOR) 40 MG tablet Take 1 tablet (40 mg total) by mouth daily. (Patient taking differently: Take 40 mg by mouth at bedtime.)   azithromycin (ZITHROMAX) 250 MG tablet Take 1 tablet (250 mg total) by mouth daily.   fluticasone (FLONASE) 50 MCG/ACT nasal spray USE 2 SPRAYS IN [facility]  NOSTRIL EVERY DAY (Patient taking differently: Place 2 sprays into both nostrils daily as needed for allergies.)   fluticasone furoate-vilanterol (BREO ELLIPTA) 200-25 MCG/ACT AEPB Inhale 1 puff into the lungs daily.   ipratropium-albuterol (DUONEB) 0.5-2.5 (3) MG/3ML SOLN Take 3 mLs by nebulization every 6 (six) hours as needed. (Patient taking differently: Take 3 mLs by nebulization every 6 (six) hours as needed (Wheezing).)   loratadine (CLARITIN) 10 MG tablet TAKE 1 TABLET (10 MG TOTAL) BY MOUTH DAILY.   OXYGEN Inhale 2.5 L into the lungs daily.   predniSONE (DELTASONE) 10 MG tablet Take 1 tablet (10 mg total) by mouth daily with breakfast.   Tiotropium Bromide Monohydrate (SPIRIVA RESPIMAT) 2.5 MCG/ACT AERS Inhale 2 puffs into the lungs daily.     Allergies:    Hydrocodone and Propoxyphene n-acetaminophen   Social History   Socioeconomic History   Marital status: Widowed    Spouse name: Not on file   Number of children: Not on file   Years of education: Not on file   Highest education level: Not on file  Occupational History   Not on file  Tobacco Use   Smoking status: Former    Packs/day: 1.50    Years: 49.00    Pack years: 73.50    Types: Cigarettes    Quit date: 11/03/2018    Years since quitting: 3.0   Smokeless tobacco: Never  Vaping Use   Vaping Use: Never used  Substance and Sexual Activity   Alcohol use: No    Alcohol/week: 0.0 standard drinks   Drug use: No   Sexual activity: Not on file  Other Topics Concern   Not on file  Social History Narrative   Not on file   Social Determinants of Health   Financial Resource Strain: Not on file  Food Insecurity: Not on file  Transportation Needs: Not on file  Physical Activity: Not on file  Stress: Not on file  Social Connections: Not on file     Family History: The patient's family history includes COPD in her father; Heart disease in her mother; Liver cancer in her paternal grandmother; Lung cancer in her maternal grandfather. There is no history of Colon cancer, Colon polyps, Esophageal cancer, Rectal cancer, or Stomach cancer. ROS:   Please see the history of present illness.    All 14 point review of systems negative except as described per history of present illness  EKGs/Labs/Other Studies Reviewed:      Recent Labs: 06/13/2021: B Natriuretic Peptide 9.9 06/18/2021: Magnesium 2.2 08/13/2021: ALT 14; BUN 17; Creatinine, Ser 0.56; Hemoglobin 10.0; Platelets 417; Potassium 4.5; Sodium 140  Recent Lipid Panel    Component Value Date/Time   CHOL 147 02/15/2021 1139   TRIG 130 06/13/2021 1623   HDL 58 02/15/2021 1139   CHOLHDL 2.5 02/15/2021 1139   CHOLHDL 3.1 09/05/2016 1218   VLDL 39 (H) 09/05/2016 1218   LDLCALC 70 02/15/2021 1139   LDLDIRECT 166 (H)  09/26/2015 1001    Physical Exam:    VS:  BP 120/70 (BP Location: Left Arm)    Pulse (!) 104    Ht 5\' 4"  (1.626 m)    Wt 123 lb (55.8 kg)    SpO2 99%    BMI 21.11 kg/m     Wt Readings from Last 3 Encounters:  11/28/21 123 lb (55.8 kg)  10/31/21 123 lb 12.8 oz (56.2 kg)  09/19/21 120 lb (54.4 kg)     GEN:  Well nourished, well developed  in no acute distress HEENT: Normal NECK: No JVD; No carotid bruits LYMPHATICS: No lymphadenopathy CARDIAC: RRR, no murmurs, no rubs, no gallops RESPIRATORY: Poor air entry with diffuse wheezes and rhonchi ABDOMEN: Soft, non-tender, non-distended MUSCULOSKELETAL:  No edema; No deformity  SKIN: Warm and dry LOWER EXTREMITIES: no swelling NEUROLOGIC:  Alert and oriented x 3 PSYCHIATRIC:  Normal affect   ASSESSMENT:    1. Coronary arteriosclerosis   2. Mild aortic insufficiency   3. Chronic respiratory failure with hypoxia (HCC)   4. COPD, group D, by GOLD 2017 classification ([place])   5. Dyspnea on exertion    PLAN:    In order of problems listed above:  Coronary atherosclerosis.  Does not have any evidence of reactivation of the problem.  Stress test done few years ago was negative.  He does have a lot of symptomatology which is related to her COPD.  We will continue risk factors modifications which include antiplatelets therapy as well as statin. Mild aortic insufficiency.  Insignificant continue monitoring. COPD with recent pneumonia.  She is still on steroids as well as scheduled nebulizers.  She reports her heart speeding up when she is short of breath and I told her this compensation of her pulmonary issues Dyspnea exertion most likely related to her COPD   Medication Adjustments/Labs and Tests Ordered: Current medicines are reviewed at length with the patient today.  Concerns regarding medicines are outlined above.  No orders of the defined types were placed in this encounter.  Medication changes: No orders of the defined types were  placed in this encounter.   Signed, Park Liter, MD, [facility] 11/28/2021 2:07 PM    [place]

## 2021-11-28 NOTE — Patient Instructions (Signed)
Medication Instructions:  ?Your physician recommends that you continue on your current medications as directed. Please refer to the Current Medication list given to you today. ? ?*If you need a refill on your cardiac medications before your next appointment, please call your pharmacy* ? ? ?Lab Work: ?None ?If you have labs (blood work) drawn today and your tests are completely normal, you will receive your results only by: ?MyChart Message (if you have MyChart) OR ?A paper copy in the mail ?If you have any lab test that is abnormal or we need to change your treatment, we will call you to review the results. ? ? ?Testing/Procedures: ?None ? ? ?Follow-Up: ?At [facility], you and your health needs are our priority.  As part of our continuing mission to provide you with exceptional heart care, we have created designated Provider Care Teams.  These Care Teams include your primary Cardiologist (physician) and Advanced Practice Providers (APPs -  Physician Assistants and Nurse Practitioners) who all work together to provide you with the care you need, when you need it. ? ?We recommend signing up for the patient portal called "MyChart".  Sign up information is provided on this After Visit Summary.  MyChart is used to connect with patients for Virtual Visits (Telemedicine).  Patients are able to view lab/test results, encounter notes, upcoming appointments, etc.  Non-urgent messages can be sent to your provider as well.   ?To learn more about what you can do with MyChart, go to [email or web address].   ? ?Your next appointment:   ?6 month(s) ? ?The format for your next appointment:   ?In Person ? ?Provider:   ?Robert Krasowski, MD  ? ? ?Other Instructions ?None ? ?

## 2021-12-15 ENCOUNTER — Other Ambulatory Visit: Payer: Self-pay | Admitting: Physician Assistant

## 2021-12-15 ENCOUNTER — Other Ambulatory Visit: Payer: Self-pay | Admitting: Pulmonary Disease

## 2021-12-15 NOTE — Telephone Encounter (Signed)
This is Dr. Krasowski's pt 

## 2022-01-02 ENCOUNTER — Other Ambulatory Visit (HOSPITAL_BASED_OUTPATIENT_CLINIC_OR_DEPARTMENT_OTHER): Payer: Self-pay

## 2022-01-03 ENCOUNTER — Other Ambulatory Visit (HOSPITAL_BASED_OUTPATIENT_CLINIC_OR_DEPARTMENT_OTHER): Payer: Self-pay

## 2022-01-03 DIAGNOSIS — H2513 Age-related nuclear cataract, bilateral: Secondary | ICD-10-CM | POA: Diagnosis not present

## 2022-01-03 DIAGNOSIS — H524 Presbyopia: Secondary | ICD-10-CM | POA: Diagnosis not present

## 2022-01-04 DIAGNOSIS — H524 Presbyopia: Secondary | ICD-10-CM | POA: Diagnosis not present

## 2022-01-04 DIAGNOSIS — H2513 Age-related nuclear cataract, bilateral: Secondary | ICD-10-CM | POA: Diagnosis not present

## 2022-01-09 ENCOUNTER — Telehealth: Payer: Self-pay | Admitting: Pulmonary Disease

## 2022-01-09 NOTE — Telephone Encounter (Signed)
I called and spoke with the pt  ?She states coughing up black colored sputum  ?Denies fever ?She is scheduled with TP for tomorrow at 10 am  ?Advised to seek emergent care sooner if needed  ?

## 2022-01-10 ENCOUNTER — Ambulatory Visit (INDEPENDENT_AMBULATORY_CARE_PROVIDER_SITE_OTHER): Payer: Medicare HMO | Admitting: Adult Health

## 2022-01-10 ENCOUNTER — Ambulatory Visit (INDEPENDENT_AMBULATORY_CARE_PROVIDER_SITE_OTHER): Payer: Medicare HMO

## 2022-01-10 ENCOUNTER — Other Ambulatory Visit (HOSPITAL_BASED_OUTPATIENT_CLINIC_OR_DEPARTMENT_OTHER): Payer: Self-pay

## 2022-01-10 ENCOUNTER — Encounter: Payer: Self-pay | Admitting: Adult Health

## 2022-01-10 VITALS — BP 118/60 | HR 106 | Temp 98.6°F | Ht 64.0 in | Wt 123.0 lb

## 2022-01-10 DIAGNOSIS — E43 Unspecified severe protein-calorie malnutrition: Secondary | ICD-10-CM | POA: Diagnosis not present

## 2022-01-10 DIAGNOSIS — J441 Chronic obstructive pulmonary disease with (acute) exacerbation: Secondary | ICD-10-CM | POA: Diagnosis not present

## 2022-01-10 DIAGNOSIS — J9611 Chronic respiratory failure with hypoxia: Secondary | ICD-10-CM | POA: Diagnosis not present

## 2022-01-10 DIAGNOSIS — J439 Emphysema, unspecified: Secondary | ICD-10-CM | POA: Diagnosis not present

## 2022-01-10 DIAGNOSIS — K219 Gastro-esophageal reflux disease without esophagitis: Secondary | ICD-10-CM | POA: Diagnosis not present

## 2022-01-10 DIAGNOSIS — R5381 Other malaise: Secondary | ICD-10-CM | POA: Insufficient documentation

## 2022-01-10 DIAGNOSIS — J449 Chronic obstructive pulmonary disease, unspecified: Secondary | ICD-10-CM | POA: Diagnosis not present

## 2022-01-10 DIAGNOSIS — E46 Unspecified protein-calorie malnutrition: Secondary | ICD-10-CM | POA: Insufficient documentation

## 2022-01-10 MED ORDER — AMOXICILLIN-POT CLAVULANATE 875-125 MG PO TABS
1.0000 | ORAL_TABLET | Freq: Two times a day (BID) | ORAL | 0 refills | Status: AC
  Filled 2022-01-10: qty 14, 7d supply, fill #0

## 2022-01-10 NOTE — Progress Notes (Signed)
? ?'@Patient'$  ID: Margaret Henry, female    DOB: Jan 22, 1951, 71 y.o.   MRN: [id number] ? ?Chief Complaint  ?Patient presents with  ? Acute Visit  ? ? ?Referring provider: ?[name], MD ? ?HPI: ?71 yo female former smoker followed for very severe COPD and chronic respiratory failure on oxygen,  ?Medical history significant for diastolic dysfunction, anemia ? ?TEST/EVENTS :  ?2017 FEV1 27%  ? ?01/10/2022 Acute OV : COPD , O2 RF  ?Patient presents for an acute office visit.  She complains of 1 week of increased cough, congestion, thick brown mucus and increased shortness of breath . Decreased activity tolerance last 3 days.   Patient is prone to frequent exacerbations.  She is on an aggressive maintenance regimen with Breo, Spiriva, daily azithromycin 250 mg, prednisone 10 mg daily.  She does flutter valve Twice daily  and hypertonic saline nebs daily . Uses Duoneb Three times a day  .   Last chest x-ray November 2022 showed COPD changes no acute process. Remains on Oxygen 2.5lm with activity and At bedtime  .  ?She denies any fever, hemoptysis, chest pain, orthopnea or edema ?She participates in the lung cancer screening program.  She is due for CT in July 2023.  Last CT chest showed benign appearance with stable chronic changes. ?Appetite is good . No n/v/d.  ?Lives alone . Does not drive. Sedentary lifestyle,. Can do ADLS , no cooking or cleaning.  ?Complains of abdominal bloating and intermittent reflux.  Takes Pepto on occasion.  No bloody stools.  No vomiting. ?Accompanied by son today. ? ? ? ? ?Allergies  ?Allergen Reactions  ? Hydrocodone Nausea And Vomiting  ? Propoxyphene N-Acetaminophen Nausea And Vomiting  ? ? ?Immunization History  ?Administered Date(s) Administered  ? Fluad Quad(high Dose 65+) 07/20/2019, 07/12/2021  ? Influenza Split 07/07/2013, 07/05/2016  ? Influenza Whole 11/09/2009  ? Influenza,inj,Quad PF,6+ Mos 06/24/2015, 07/03/2017, 06/19/2018  ? Pneumococcal Conjugate-13 09/26/2015  ?  Pneumococcal Polysaccharide-23 10/08/1998, 07/08/2018  ? Td 10/08/2002  ? Tdap 09/10/2016  ? ? ?Past Medical History:  ?Diagnosis Date  ? Allergy   ? hayfever  ? Bronchitis   ? Cancer [facility])   ? skin cancer on chest  ? Colon polyps   ? Complication of anesthesia   ? pt states was given too much during nasal surgery 1989; difficulty getting awake  ? COPD (chronic obstructive pulmonary disease) ([place])   ? Coronary artery calcification   ? Diverticulitis   ? GERD (gastroesophageal reflux disease)   ? occasional  ? Hemorrhoids   ? High cholesterol   ? Hyperlipidemia LDL goal <70   ? Mild aortic insufficiency   ? Osteoporosis   ? Pneumonia   ? Shortness of breath dyspnea   ? with exertion   ? Thyroid goiter   ? bx benign  ? Tobacco abuse   ? Vertigo   ? ? ?Tobacco History: ?Social History  ? ?Tobacco Use  ?Smoking Status Former  ? Packs/day: 1.50  ? Years: 49.00  ? Pack years: 73.50  ? Types: Cigarettes  ? Quit date: 11/03/2018  ? Years since quitting: 3.1  ?Smokeless Tobacco Never  ? ?Counseling given: Not Answered ? ? ?Outpatient Medications Prior to Visit  ?Medication Sig Dispense Refill  ? acetaminophen (TYLENOL) 500 MG tablet Take 1,500 mg by mouth every 4 (four) hours as needed for headache.     ? albuterol (VENTOLIN HFA) 108 (90 Base) MCG/ACT inhaler Inhale 2 puffs into the lungs every  6 (six) hours as needed for wheezing or shortness of breath. 18 g 2  ? aspirin (ASPIRIN ADULT LOW STRENGTH) 81 MG EC tablet TAKE 1 TABLET (81 MG TOTAL) BY MOUTH DAILY. (Patient taking differently: Take 81 mg by mouth at bedtime.) 90 tablet 1  ? atorvastatin (LIPITOR) 40 MG tablet TAKE 1 TABLET EVERY DAY 90 tablet 3  ? azithromycin (ZITHROMAX) 250 MG tablet Take 1 tablet (250 mg total) by mouth daily. 30 tablet 3  ? fluticasone (FLONASE) 50 MCG/ACT nasal spray USE 2 SPRAYS IN EACH NOSTRIL EVERY DAY (Patient taking differently: Place 2 sprays into both nostrils daily as needed for allergies.) 16 g 6  ? fluticasone furoate-vilanterol  (BREO ELLIPTA) 200-25 MCG/ACT AEPB INHALE 1 PUFF EVERY DAY 180 each 3  ? ipratropium-albuterol (DUONEB) 0.5-2.5 (3) MG/3ML SOLN Take 3 mLs by nebulization every 6 (six) hours as needed. (Patient taking differently: Take 3 mLs by nebulization every 6 (six) hours as needed (Wheezing).) 180 mL 0  ? loratadine (CLARITIN) 10 MG tablet TAKE 1 TABLET (10 MG TOTAL) BY MOUTH DAILY. 100 tablet 2  ? OXYGEN Inhale 2.5 L into the lungs daily.    ? predniSONE (DELTASONE) 10 MG tablet Take 1 tablet (10 mg total) by mouth daily with breakfast. 30 tablet 3  ? Tiotropium Bromide Monohydrate (SPIRIVA RESPIMAT) 2.5 MCG/ACT AERS Inhale 2 puffs into the lungs daily.    ? ?No facility-administered medications prior to visit.  ? ? ? ?Review of Systems:  ? ?Constitutional:   No  weight loss, night sweats,  Fevers, chills,  ?+fatigue, or  lassitude. ? ?HEENT:   No headaches,  Difficulty swallowing,  Tooth/dental problems, or  Sore throat,  ?              No sneezing, itching, ear ache, nasal congestion, post nasal drip,  ? ?CV:  No chest pain,  Orthopnea, PND, swelling in lower extremities, anasarca, dizziness, palpitations, syncope.  ? ?GI  No , abdominal pain, nausea, vomiting, diarrhea, change in bowel habits, loss of appetite, bloody stools.  ? ?Resp: .  No chest wall deformity ? ?Skin: no rash or lesions. ? ?GU: no dysuria, change in color of urine, no urgency or frequency.  No flank pain, no hematuria  ? ?MS:  No joint pain or swelling.  No decreased range of motion.  No back pain. ? ? ? ?Physical Exam ? ?BP 118/60 (BP Location: Right Arm, Cuff Size: Normal)   Pulse (!) 106   Temp 98.6 ?F (37 ?C) (Temporal)   Ht '5\' 4"'$  (1.626 m)   Wt 123 lb (55.8 kg)   SpO2 98%   BMI 21.11 kg/m?  ? ?GEN: A/Ox3; pleasant , NAD, chronically ill-appearing, frail, in wheelchair, on oxygen ?  ?HEENT:  [place]/AT,  NOSE-clear, THROAT-clear, no lesions, no postnasal drip or exudate noted.  ? ?NECK:  Supple w/ fair ROM; no JVD; normal carotid impulses w/o  bruits; no thyromegaly or nodules palpated; no lymphadenopathy.   ? ?RESP  Clear  P & A; w/o, wheezes/ rales/ or rhonchi. no accessory muscle use, no dullness to percussion ? ?CARD:  RRR, no m/r/g, no peripheral edema, pulses intact, no cyanosis or clubbing. ? ?GI:   Soft & nt; nml bowel sounds; no organomegaly or masses detected.  ? ?Musco: Warm bil, no deformities or joint swelling noted.  ? ?Neuro: alert, no focal deficits noted.   ? ?Skin: Warm, no lesions or rashes ? ? ? ?Lab Results: ? ?CBC ? ? ?  BNP ? ? ?ProBNP ?No results found for: PROBNP ? ?Imaging: ?No results found. ? ? ? ? ?  Latest Ref Rng & Units 07/21/2015  ?  9:33 AM  ?PFT Results  ?FVC-Pre L 2.06    ?FVC-Predicted Pre % 66    ?FVC-Post L 2.23    ?FVC-Predicted Post % 72    ?Pre FEV1/FVC % % 45    ?Post FEV1/FCV % % 46    ?FEV1-Pre L 0.93    ?FEV1-Predicted Pre % 39    ?FEV1-Post L 1.03    ?DLCO uncorrected ml/min/mmHg 8.56    ?DLCO UNC% % 36    ?DLVA Predicted % 39    ? ? ?No results found for: NITRICOXIDE ? ? ? ? ? ?Assessment & Plan:  ? ?COPD, group D, by GOLD 2017 classification ([place]) ?Acute COPD exacerbation.  Patient is prone to frequent exacerbations despite an aggressive maintenance regimen with Breo Spiriva daily azithromycin daily prednisone hypertonic nebs and DuoNeb. ?We will check chest x-ray today.  Treat with empiric antibiotics and short prednisone burst. ?Patient has very severe COPD with an FEV1 at 27% in 2017.  Suspect this is even lower at this point. ?Did talk about quality of life issues patient is homebound and very sedentary on oxygen.  Discussed palliative care.  Patient declines.  Patient education was given. ?Check sputum culture  ? ?Plan  ?Patient Instructions  ?Augmentin '875mg'$  Twice daily  for 1 week -take w/ food . (Hold Azithromycin while taking and resume when done)  ?Begin Probiotic daily  ?Sputum culture  ?Begin Pepcid '20mg'$  At bedtime   ?Robitussin DM Twice daily  As needed  cough/congestion  ?Increase Flutter valve  Three times a day   ?Continue on Breo and Spiriva ?Albuterol inhaler or Duoneb As needed   ?Increase Prednisone '20mg'$  daily for 1 week , then resume prednisone '10mg'$  daily .  ?Chest xray today .  ?Continue on Oxygen 2.5l/m t

## 2022-01-10 NOTE — Assessment & Plan Note (Signed)
Recommend high-protein diet 

## 2022-01-10 NOTE — Assessment & Plan Note (Addendum)
Acute COPD exacerbation.  Patient is prone to frequent exacerbations despite an aggressive maintenance regimen with Breo Spiriva daily azithromycin daily prednisone hypertonic nebs and DuoNeb. ?We will check chest x-ray today.  Treat with empiric antibiotics and short prednisone burst. ?Patient has very severe COPD with an FEV1 at 27% in 2017.  Suspect this is even lower at this point. ?Did talk about quality of life issues patient is homebound and very sedentary on oxygen.  Discussed palliative care.  Patient declines.  Patient education was given. ?Check sputum culture  ? ?Plan  ?Patient Instructions  ?Augmentin '875mg'$  Twice daily  for 1 week -take w/ food . (Hold Azithromycin while taking and resume when done)  ?Begin Probiotic daily  ?Sputum culture  ?Begin Pepcid '20mg'$  At bedtime   ?Robitussin DM Twice daily  As needed  cough/congestion  ?Increase Flutter valve Three times a day   ?Continue on Breo and Spiriva ?Albuterol inhaler or Duoneb As needed   ?Increase Prednisone '20mg'$  daily for 1 week , then resume prednisone '10mg'$  daily .  ?Chest xray today .  ?Continue on Oxygen 2.5l/m to keep sats >88-90%.  ?Follow up with Dr. Vaughan Browner next month as planned and As needed   ?Please contact office for sooner follow up if symptoms do not improve or worsen or seek emergency care  ? ? ? ? ? ?  ? ?

## 2022-01-10 NOTE — Patient Instructions (Addendum)
Augmentin '875mg'$  Twice daily  for 1 week -take w/ food . (Hold Azithromycin while taking and resume when done)  ?Begin Probiotic daily  ?Sputum culture  ?Begin Pepcid '20mg'$  At bedtime   ?Robitussin DM Twice daily  As needed  cough/congestion  ?Increase Flutter valve Three times a day   ?Continue on Breo and Spiriva ?High protein diet  ?Albuterol inhaler or Duoneb As needed   ?Increase Prednisone '20mg'$  daily for 1 week , then resume prednisone '10mg'$  daily .  ?Chest xray today .  ?Continue on Oxygen 2.5l/m to keep sats >88-90%.  ?Follow up with Dr. Vaughan Browner next month as planned and As needed   ?Please contact office for sooner follow up if symptoms do not improve or worsen or seek emergency care  ? ? ? ? ? ?

## 2022-01-10 NOTE — Assessment & Plan Note (Signed)
GERD diet.  Add Pepcid 20 mg at bedtime.  Also add a probiotic as she is on daily antibiotics ?

## 2022-01-10 NOTE — Assessment & Plan Note (Addendum)
Continue with oxygen to maintain O2 saturations greater than 88 to 90%. ?Could consider checking an outpatient ABG for hypercarbia on return once she is stable to see if she would qualify for BiPAP or NIV to possibly help with frequent exacerbations ?

## 2022-01-10 NOTE — Assessment & Plan Note (Addendum)
Patient has significant physical deconditioning.  We discussed in-home assistance possible palliative care she declines. ?Advised on activity as tolerated ?

## 2022-01-11 LAB — RESPIRATORY CULTURE OR RESPIRATORY AND SPUTUM CULTURE: MICRO NUMBER:: 13225855

## 2022-01-15 NOTE — Progress Notes (Signed)
Spoke with the pt and notified of results/recs per Tammy. She verbalized understanding. Nothing further needed. Will keep planned ov with Dr Vaughan Browner and have cxr then.

## 2022-01-15 NOTE — Progress Notes (Signed)
ATC x1.  LVM to return call. 

## 2022-01-26 ENCOUNTER — Encounter: Payer: Self-pay | Admitting: Pulmonary Disease

## 2022-01-29 NOTE — Telephone Encounter (Signed)
Pt states her insurance requires her to get blood work done every year to check calcium, cholesterol etc to make sure all of her levels are where they need to be. Pt states she eats bananas for the leg cramps, and says the leg cramps has been going on for a couple of months. ? ?Pt next appt is 04/25 at 11:30 ?

## 2022-01-29 NOTE — Telephone Encounter (Signed)
Dr. Vaughan Browner, pt is requesting to have her calcium and cholesterol checked at [facility] tomorrow 4/25 with you. Pt states her insurance requires this blood draw. Pt aware this can be discussed at the [facility]. Thanks.  ?

## 2022-01-30 ENCOUNTER — Encounter: Payer: Self-pay | Admitting: Pulmonary Disease

## 2022-01-30 ENCOUNTER — Other Ambulatory Visit: Payer: Self-pay | Admitting: *Deleted

## 2022-01-30 ENCOUNTER — Ambulatory Visit (INDEPENDENT_AMBULATORY_CARE_PROVIDER_SITE_OTHER): Payer: Medicare HMO | Admitting: Pulmonary Disease

## 2022-01-30 ENCOUNTER — Other Ambulatory Visit (HOSPITAL_BASED_OUTPATIENT_CLINIC_OR_DEPARTMENT_OTHER): Payer: Self-pay

## 2022-01-30 ENCOUNTER — Ambulatory Visit (INDEPENDENT_AMBULATORY_CARE_PROVIDER_SITE_OTHER): Payer: Medicare HMO

## 2022-01-30 VITALS — BP 112/56 | HR 82 | Temp 98.1°F | Ht 64.0 in | Wt 124.6 lb

## 2022-01-30 DIAGNOSIS — J441 Chronic obstructive pulmonary disease with (acute) exacerbation: Secondary | ICD-10-CM | POA: Diagnosis not present

## 2022-01-30 DIAGNOSIS — J439 Emphysema, unspecified: Secondary | ICD-10-CM | POA: Diagnosis not present

## 2022-01-30 LAB — COMPREHENSIVE METABOLIC PANEL
ALT: 9 U/L (ref 0–35)
AST: 13 U/L (ref 0–37)
Albumin: 4.1 g/dL (ref 3.5–5.2)
Alkaline Phosphatase: 45 U/L (ref 39–117)
BUN: 11 mg/dL (ref 6–23)
CO2: 24 mEq/L (ref 19–32)
Calcium: 8.8 mg/dL (ref 8.4–10.5)
Chloride: 108 mEq/L (ref 96–112)
Creatinine, Ser: 0.56 mg/dL (ref 0.40–1.20)
GFR: 91.65 mL/min (ref >60.00)
Glucose, Bld: 89 mg/dL (ref 70–99)
Potassium: 4.3 mEq/L (ref 3.5–5.1)
Sodium: 140 mEq/L (ref 135–145)
Total Bilirubin: 0.3 mg/dL (ref 0.2–1.2)
Total Protein: 6.4 g/dL (ref 6.0–8.3)

## 2022-01-30 LAB — LIPID PANEL
Cholesterol: 143 mg/dL (ref 0–200)
HDL: 74.3 mg/dL (ref >39.00)
LDL Cholesterol: 42 mg/dL (ref 0–99)
NonHDL: 68.33
Total CHOL/HDL Ratio: 2
Triglycerides: 132 mg/dL (ref 0.0–149.0)
VLDL: 26.4 mg/dL (ref 0.0–40.0)

## 2022-01-30 MED ORDER — PREDNISONE 20 MG PO TABS
ORAL_TABLET | ORAL | 0 refills | Status: DC
  Filled 2022-01-30: qty 10, 5d supply, fill #0

## 2022-01-30 MED ORDER — LEVOFLOXACIN 750 MG PO TABS
750.0000 mg | ORAL_TABLET | Freq: Every day | ORAL | 0 refills | Status: DC
  Filled 2022-01-30: qty 7, 7d supply, fill #0

## 2022-01-30 NOTE — Telephone Encounter (Signed)
Pt did have some labs checked during today's OV 4/25 with Dr. Vaughan Browner. Nothing further needed. ?

## 2022-01-30 NOTE — Progress Notes (Signed)
? ?      ?[name]    [id number]    [birth year] ? ?Primary Care Physician:Newlin, Charlane Ferretti, MD ? ?Referring Physician: [name], MD ?[address] ?[address] ?[place],  [place] [address] ? ?Chief complaint:   ?Follow up for COPD GOLD D ? ?HPI: 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 Her symptoms consist mainly of shortness of breath, daily cough with whitish sputum production, wheeze, dyspnea and exertion. She does not have any hemoptysis, chest pain, palpitations. No fevers, chills. ?  ?She is currently maintained on Spiriva and breo.  Tried on Trelegy inhaler in 2019 but had to go back to [place] and Spiriva since it made her breathing worse.  Also tried on Daliresp in 2019 but had to stop due to side effects of nausea ? ?Continues on [place] and Spiriva ?She had tried Trelegy again in early 2022 but it made her breathing worse ?She got a course of prednisone in March 2022 for mild COPD exacerbation. ? ?She has been struggling with recurrent COPD exacerbations for the past 3 months ?Had bilateral pneumonia on chest x-ray primary care in September 2022 and was given a course of Augmentin, levofloxacin with minimal improvement in symptoms.  She continues to struggle with daily cough.  She received several rounds of antibiotics and additional Z-Pak through our office ?Given breztri from the office but she did not like it and is back to [place] and Spiriva ? ?Evaluated in the ED on 08/13/2021 with chest x-ray showing clear lungs ? ?She had smoked one pack per day for nearly 25 years. Quit in 2020. 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 She does not recall any particular exposures at work or at home. ? ?Interim history: ?On chronic prednisone and azithromycin to 50 mg a day ?Seen 2 weeks ago by Patricia Nettle, nurse practitioner for exacerbation and prescribed Levaquin and prednisone burst.  She is somewhat  better but continues to have cough with brown mucus and dyspnea.  Chief complaint is fatigue ? ? ?Outpatient Encounter Medications as of 01/30/2022  ?Medication Sig  ? acetaminophen (TYLENOL) 500 MG tablet Take 1,500 mg by mouth every 4 (four) hours as needed for headache.   ? albuterol (VENTOLIN HFA) 108 (90 Base) MCG/ACT inhaler Inhale 2 puffs into the lungs every 6 (six) hours as needed for wheezing or shortness of breath.  ? aspirin (ASPIRIN ADULT LOW STRENGTH) 81 MG EC tablet TAKE 1 TABLET (81 MG TOTAL) BY MOUTH DAILY. (Patient taking differently: Take 81 mg by mouth at bedtime.)  ? atorvastatin (LIPITOR) 40 MG tablet TAKE 1 TABLET EVERY DAY  ? azithromycin (ZITHROMAX) 250 MG tablet Take 1 tablet (250 mg total) by mouth daily.  ? fluticasone (FLONASE) 50 MCG/ACT nasal spray USE 2 SPRAYS IN EACH NOSTRIL EVERY DAY (Patient taking differently: Place 2 sprays into both nostrils daily as needed for allergies.)  ? fluticasone furoate-vilanterol (BREO ELLIPTA) 200-25 MCG/ACT AEPB INHALE 1 PUFF EVERY DAY  ? ipratropium-albuterol (DUONEB) 0.5-2.5 (3) MG/3ML SOLN Take 3 mLs by nebulization every 6 (six) hours as needed. (Patient taking differently: Take 3 mLs by nebulization every 6 (six) hours as needed (Wheezing).)  ? loratadine (CLARITIN) 10 MG tablet TAKE 1 TABLET (10 MG TOTAL) BY MOUTH DAILY.  ? OXYGEN Inhale 2.5 L into the lungs daily.  ? predniSONE (DELTASONE) 10 MG tablet Take 1 tablet (10 mg total)  by mouth daily with breakfast.  ? Tiotropium Bromide Monohydrate (SPIRIVA RESPIMAT) 2.5 MCG/ACT AERS Inhale 2 puffs into the lungs daily.  ? ?No facility-administered encounter medications on file as of 01/30/2022.  ? ?Physical Exam: ?Blood pressure 130/62, pulse (!) 102, temperature 98.2 ?F (36.8 ?C), temperature source Oral, height '5\' 4"'$  (1.626 m), weight 123 lb 12.8 oz (56.2 kg), SpO2 98 %. ?Gen:      No acute distress ?HEENT:  EOMI, sclera anicteric ?Neck:     No masses; no thyromegaly ?Lungs:    Clear to  auscultation bilaterally; normal respiratory effort ?CV:         Regular rate and rhythm; no murmurs ?Abd:      + bowel sounds; soft, non-tender; no palpable masses, no distension ?Ext:    No edema; adequate peripheral perfusion ?Skin:      Warm and dry; no rash ?Neuro: alert and oriented x 3 ?Psych: normal mood and affect  ? ?Data Reviewed: ?Imaging: ?Screening CT chest 03/07/18- calcified right thyroid nodule.  Severe centrilobular emphysema, bronchial wall thickening.  Previously described 4.8 mm right lower lobe nodule now measures 2.9 mm.  Granulomatous splenic and liver calcification. ? ?CT 10/20/2019-resolution of left lower lobe pulmonary nodule, no other lung abnormality ?  ?Low-dose screening CT 10/20/2020-new 5.9 mm right lower lobe pulmonary nodule, emphysema, atherosclerosis, gallstones. ? ?Low-dose screening CT 04/24/2021-improvement of right lower lobe pulmonary nodule, emphysema ? ?Chest x-ray 06/30/2021-bilateral infiltrates ?Chest x-ray 07/12/2021-improvement in infiltrates ?Chest x-ray 08/13/2021-clear lungs ?Chest x-ray 01/10/2022-reticular opacities in the lung.   ?I have reviewed the images personally.  ? ?PFTs (07/21/15) ?FVC 2.06 66%), FEV1 0.93 (39%), F/F 45, DLCO 36% ?Unable complete pleth. ?Severe obstructive lung disease, severe diffusion impairment ? ?Spirometry 04/12/16 ?FVC 1.79 [5 7%), FEV1 0.65 (27%), F/F 36 ?Very severe obstructive airway disease ? ?Labs: ?CBC 12/03/2018-WBC 9.2, eos 0% ?ID 10/16/18-1931 ?Alpha-1 antitrypsin 11/05/2018-171, PI MM ? ?Assessment:  ?Severe COPD,Gold Stage D, Chronic bronchitis ?Continues on [place].   ?I am not sure if she is generating enough inspiratory flow for the inhalers.  We discussed switching over to nebulizers but she would like to hold off on this change for now. ?Has not tolerated Daliresp due to side effects ?Continue supplemental oxygen ?She has finished pulmonary rehab 2 times already.  Encouraged her to stay active at home ? ?Unfortunately  she is suffering with recurrent exacerbations over the past year.  Now with ongoing exacerbation.  Chest x-ray from earlier this month reviewed with reticular opacities.  Repeat chest x-ray. ?Prescribe levofloxacin and another prednisone burst of 40 mg a day for 5 days ? ?On daily azithromycin, prednisone 10 mg a day ?Codeine cough syrup for persistent cough ?Continue flutter valve daily and hypertonic saline to help with mucociliary clearance ? ?Pulmonary nodule ?Continue annual low-dose screening CT ? ?Goals of care ?We discussed her overall worsening respiratory status and frequent exacerbations.  We will make referral to palliative care ?Does not want to consider DNI yet but does say that she would not want to be supported long-term if there is no recovery ? ?Plan/Recommendations: ?- Continue Breo, Spiriva ?- Levofloxacin for exacerbation ?- Daily azithromycin, prednisone ?- Cough syrup ?- Flutter valve, hypertonic saline ?- Repeat chest x-ray ? ?Marshell Garfinkel MD ?[facility] ?01/30/2022, 11:37 AM ? ?CC: [name], MD ? ? ?

## 2022-01-30 NOTE — Patient Instructions (Addendum)
We will prescribe levofloxacin 750 mg a day for 7 days ?Prednisone 40 mg a day for 5 days and then go back to your baseline dose of 10 mg ?Continue your inhalers and nebulizers as prescribed ? ?I will make a referral to palliative care ?Follow-up in 6 months. ?

## 2022-01-31 ENCOUNTER — Other Ambulatory Visit (HOSPITAL_BASED_OUTPATIENT_CLINIC_OR_DEPARTMENT_OTHER): Payer: Self-pay

## 2022-01-31 ENCOUNTER — Telehealth: Payer: Self-pay

## 2022-01-31 ENCOUNTER — Other Ambulatory Visit: Payer: Self-pay | Admitting: *Deleted

## 2022-01-31 DIAGNOSIS — J441 Chronic obstructive pulmonary disease with (acute) exacerbation: Secondary | ICD-10-CM

## 2022-01-31 NOTE — Telephone Encounter (Signed)
Attempted to contact patient's son Legrand Como to schedule a Palliative Care consult appointment. No answer left a message to return call.  ?

## 2022-02-01 ENCOUNTER — Other Ambulatory Visit (HOSPITAL_BASED_OUTPATIENT_CLINIC_OR_DEPARTMENT_OTHER): Payer: Self-pay

## 2022-02-01 ENCOUNTER — Telehealth: Payer: Self-pay

## 2022-02-01 NOTE — Telephone Encounter (Signed)
Spoke with patient and scheduled a Mychart Palliative Consult for 02/05/22 @ 2 PM.  ? ?Consent obtained; updated Netsmart, Team List and Epic.  ? ?

## 2022-02-05 ENCOUNTER — Telehealth: Payer: Medicaid Other | Admitting: Hospice

## 2022-02-05 DIAGNOSIS — R52 Pain, unspecified: Secondary | ICD-10-CM

## 2022-02-05 DIAGNOSIS — Z515 Encounter for palliative care: Secondary | ICD-10-CM

## 2022-02-05 DIAGNOSIS — J449 Chronic obstructive pulmonary disease, unspecified: Secondary | ICD-10-CM

## 2022-02-05 NOTE — Progress Notes (Signed)
? ? ?Manufacturing engineer ?Community Palliative Care Consult Note ?Telephone: [phone]  ?Fax: [phone] ? ?PATIENT NAME: Margaret Henry ?Margaret Henry [id number] ?[phone] (home)  ?DOB: 1951-03-06 ?MRN: [id number] ? ?PRIMARY CARE PROVIDER:    ?Margaret Rakes, MD,  ?[address] ?[place] [address] ?[phone] ? ?REFERRING PROVIDER:   ?Margaret Rakes, MD ?[address] ?[address] ?[place],  [place] [address] ?[phone] ? ?RESPONSIBLE PARTY:   Self ?Contact Information   ? ? Name Relation Home Work Mobile  ? Margaret Henry Son   [phone]  ? ?  ? ? ?TELEHEALTH VISIT STATEMENT ?Due to the COVID-19 crisis, this visit was done via telemedicine from my office and it was initiated and consent by this patient and or family.  ?I connected with patient OR PROXY by a telephone/video  and verified that I am speaking with the correct person. I discussed the limitations of evaluation and management by telemedicine. The patient expressed understanding and agreed to proceed. ?Palliative Care was asked to follow this patient to address advance care planning, complex medical decision making and goals of care clarification. This is the initial visit.  ? ?  ASSESSMENT AND / RECOMMENDATIONS:  ? ?Advance Care Planning: Our advance care planning conversation included a discussion about:    ?The value and importance of advance care planning  ?Difference between Hospice and Palliative care ?Exploration of goals of care in the event of a sudden injury or illness  ?Identification and preparation of a healthcare agent  ?Review and updating or creation of an  advance directive document . ?Decision not to resuscitate or to de-escalate disease focused treatments due to poor prognosis. ? ?CODE STATUS: Discussion on code status. Patient elects to be a Partial code. She wants chest compressions with ACLS medication, no intubation/mechanical ventillation.  ? ?Goals of Care: Goals  include to maximize quality of life and symptom management ? ?I spent 16 minutes providing this initial consultation. More than 50% of the time in this consultation was spent on counseling patient and coordinating communication. ?-------------------------------------------------------------------------------------------------------------------------------------- ? ?Symptom Management/Plan: ?COPD: Severe, recent exacerbation last week; treated with antibiotics and prednisone taper; dry coughing managed with Robitussin; currently on oxygen supplementation. Followed by Pulmonologist. Continue on prednisone as ordered, and Albuterol, Spiriva, Breo breathing treatments. Use flutter valve. Stay away from triggers.  ?Pain: Generalized, and sometimes bilateral lower extremity.  Tylenol is effective.  Encouraged activity/exercise as tolerated to optimize wellbeing. ? ?Follow up: Palliative care will continue to follow for complex medical decision making, advance care planning, and clarification of goals. Return 6 weeks or prn. Encouraged to call provider sooner with any concerns.  ? ?Family /Caregiver/Community Supports: Patient lives at home independently. Son and sister and brother inlaw  come by to help. Strong family support system identified.  ? ?HOSPICE ELIGIBILITY/DIAGNOSIS: TBD ? ?Chief Complaint: Initial Palliative care visit ? ?HISTORY OF PRESENT ILLNESS:  LYLIA Henry is a 71 y.o. year old female  with multiple morbidities requiring close monitoring and with high risk of complications and  mortality: COPD with recent exacerbation.  Patient reports occasional mild to moderate pain-generalized and sometimes to bilateral lower extremity for which she saw PCP last week.  Tylenol is effective.  She endorses cough, in no respiratory distress. ?History obtained from review of EMR, discussion with primary team, caregiver, family and/or Margaret Henry.  ?Review and summarization of Epic records shows history from other than  patient. Rest of 10 point ROS asked and negative.  ?  Independent interpretation of tests and reviewed as needed, available labs, patient records, imaging, studies and related documents from the EMR. ? ?No results for input(s): WBC, HGB, HCT, PLT, MCV in the last 168 hours. ?Recent Labs  ?Lab 01/30/22 ?1158  ?NA 140  ?K 4.3  ?CL 108  ?CO2 24  ?BUN 11  ?CREATININE 0.56  ?GLUCOSE 89  ? ?Latest GFR by [place] (not valid in AKI or ESRD) ?Estimated Creatinine Clearance: 55.7 mL/min (by C-G formula based on SCr of 0.56 mg/dL). ?Recent Labs  ?Lab 01/30/22 ?1158  ?AST 13  ?ALT 9  ?ALKPHOS 45  ? ? ? ?ROS ?General: NAD ?EYES: denies vision changes ?ENMT: denies dysphagia ?Cardiovascular: denies chest pain/discomfort ?Pulmonary: Endorses cough, nonproductive, shortness of breath on exertion ?Abdomen: endorses good appetite, denies constipation/diarrhea ?GU: denies dysuria, urinary frequency ?MSK:  endorses weakness,  no falls reported ?Skin: denies rashes or wounds ?Neurological: denies pain, denies insomnia ?Psych: Endorses positive mood ?Heme/lymph/immuno: denies bruises, abnormal bleeding ? ? ?PAST MEDICAL HISTORY:  ?Active Ambulatory Problems  ?  Diagnosis Date Noted  ? Goiter, unspecified 10/08/1997  ? THYROID NODULE, RIGHT 11/09/2009  ? Unspecified vitamin D deficiency 03/02/2010  ? Hyperlipidemia 02/25/2009  ? Allergic rhinitis 03/02/2010  ? GERD (gastroesophageal reflux disease) 02/25/2009  ? BACK PAIN, LUMBAR 03/02/2010  ? TROCHANTERIC BURSITIS, BILATERAL 03/02/2010  ? OSTEOPOROSIS 01/30/2009  ? COMPRESSION FRACTURE, SPINE 01/21/2009  ? COPD exacerbation ([place]) 06/01/2013  ? Coronary arteriosclerosis 09/26/2015  ? Seborrheic keratoses 06/12/2016  ? Chest pain 06/12/2016  ? Dyspnea 06/27/2016  ? Vertigo 04/21/2017  ? Chronic respiratory failure ([place]) 10/04/2017  ? Overactive bladder 03/17/2018  ? COPD, group D, by GOLD 2017 classification ([place]) 10/03/2018  ? Acute respiratory failure with hypoxia ([place]) 12/03/2018   ? Pneumonia 05/16/2021  ? Mild aortic insufficiency 05/16/2021  ? Hemorrhoids 05/16/2021  ? Diverticulitis 05/16/2021  ? Complication of anesthesia 05/16/2021  ? Colon polyps 05/16/2021  ? Cancer ([place]) 05/16/2021  ? Bronchitis 05/16/2021  ? Allergy 05/16/2021  ? Syncope and collapse 06/13/2021  ? COVID-19 virus infection 06/13/2021  ? Acute on chronic respiratory failure with hypoxia ([place]) 06/13/2021  ? Physical deconditioning 01/10/2022  ? Protein calorie malnutrition ([place]) 01/10/2022  ? ?Resolved Ambulatory Problems  ?  Diagnosis Date Noted  ? TOBACCO ABUSE 09/07/2008  ? DEPRESSION 11/09/2009  ? COPD (chronic obstructive pulmonary disease) ([place]) 09/07/2008  ? Cough 11/09/2009  ? LUQ PAIN 09/07/2008  ? Acute upper respiratory infection 03/26/2016  ? Pain and swelling of toe of right foot 06/12/2016  ? ?Past Medical History:  ?Diagnosis Date  ? Coronary artery calcification   ? High cholesterol   ? Hyperlipidemia LDL goal <70   ? Osteoporosis   ? Shortness of breath dyspnea   ? Thyroid goiter   ? Tobacco abuse   ? ? ?SOCIAL HX:  ?Social History  ? ?Tobacco Use  ? Smoking status: Former  ?  Packs/day: 1.50  ?  Years: 49.00  ?  Pack years: 73.50  ?  Types: Cigarettes  ?  Quit date: 11/03/2018  ?  Years since quitting: 3.2  ? Smokeless tobacco: Never  ?Substance Use Topics  ? Alcohol use: No  ?  Alcohol/week: 0.0 standard drinks  ? ?  ?FAMILY HX:  ?Family History  ?Problem Relation Age of Onset  ? Heart disease Mother   ? COPD Father   ? Lung cancer Maternal Grandfather   ? Liver cancer Paternal Grandmother   ? Colon cancer Neg Hx   ?  Colon polyps Neg Hx   ? Esophageal cancer Neg Hx   ? Rectal cancer Neg Hx   ? Stomach cancer Neg Hx   ?   ? ?ALLERGIES:  ?Allergies  ?Allergen Reactions  ? Hydrocodone Nausea And Vomiting  ? Propoxyphene N-Acetaminophen Nausea And Vomiting  ?   ? ?PERTINENT MEDICATIONS:  ?Outpatient Encounter Medications as of 02/05/2022  ?Medication Sig  ? acetaminophen (TYLENOL) 500 MG tablet Take 1,500  mg by mouth every 4 (four) hours as needed for headache.   ? albuterol (VENTOLIN HFA) 108 (90 Base) MCG/ACT inhaler Inhale 2 puffs into the lungs every 6 (six) hours as needed for wheezing or shortness of b

## 2022-02-07 DIAGNOSIS — H353130 Nonexudative age-related macular degeneration, bilateral, stage unspecified: Secondary | ICD-10-CM | POA: Diagnosis not present

## 2022-02-07 DIAGNOSIS — H02831 Dermatochalasis of right upper eyelid: Secondary | ICD-10-CM | POA: Diagnosis not present

## 2022-02-07 DIAGNOSIS — H02834 Dermatochalasis of left upper eyelid: Secondary | ICD-10-CM | POA: Diagnosis not present

## 2022-02-07 DIAGNOSIS — H25813 Combined forms of age-related cataract, bilateral: Secondary | ICD-10-CM | POA: Diagnosis not present

## 2022-02-19 DIAGNOSIS — H353130 Nonexudative age-related macular degeneration, bilateral, stage unspecified: Secondary | ICD-10-CM | POA: Diagnosis not present

## 2022-02-19 DIAGNOSIS — H25813 Combined forms of age-related cataract, bilateral: Secondary | ICD-10-CM | POA: Diagnosis not present

## 2022-02-22 DIAGNOSIS — H5703 Miosis: Secondary | ICD-10-CM | POA: Insufficient documentation

## 2022-02-22 DIAGNOSIS — H25811 Combined forms of age-related cataract, right eye: Secondary | ICD-10-CM | POA: Diagnosis not present

## 2022-02-22 DIAGNOSIS — H25813 Combined forms of age-related cataract, bilateral: Secondary | ICD-10-CM | POA: Diagnosis not present

## 2022-02-22 DIAGNOSIS — H02831 Dermatochalasis of right upper eyelid: Secondary | ICD-10-CM | POA: Diagnosis not present

## 2022-02-22 DIAGNOSIS — Z87891 Personal history of nicotine dependence: Secondary | ICD-10-CM | POA: Diagnosis not present

## 2022-02-22 DIAGNOSIS — H2181 Floppy iris syndrome: Secondary | ICD-10-CM | POA: Insufficient documentation

## 2022-02-22 DIAGNOSIS — J449 Chronic obstructive pulmonary disease, unspecified: Secondary | ICD-10-CM | POA: Diagnosis not present

## 2022-02-22 DIAGNOSIS — E785 Hyperlipidemia, unspecified: Secondary | ICD-10-CM | POA: Diagnosis not present

## 2022-02-22 DIAGNOSIS — H02834 Dermatochalasis of left upper eyelid: Secondary | ICD-10-CM | POA: Diagnosis not present

## 2022-02-22 DIAGNOSIS — Z90710 Acquired absence of both cervix and uterus: Secondary | ICD-10-CM | POA: Diagnosis not present

## 2022-02-22 DIAGNOSIS — Z7982 Long term (current) use of aspirin: Secondary | ICD-10-CM | POA: Diagnosis not present

## 2022-02-23 ENCOUNTER — Telehealth: Payer: Self-pay | Admitting: Pulmonary Disease

## 2022-02-23 ENCOUNTER — Other Ambulatory Visit (HOSPITAL_BASED_OUTPATIENT_CLINIC_OR_DEPARTMENT_OTHER): Payer: Self-pay

## 2022-02-23 MED ORDER — DOXYCYCLINE HYCLATE 100 MG PO TABS
100.0000 mg | ORAL_TABLET | Freq: Two times a day (BID) | ORAL | 0 refills | Status: DC
  Filled 2022-02-23: qty 14, 7d supply, fill #0

## 2022-02-23 MED ORDER — PREDNISONE 10 MG PO TABS
40.0000 mg | ORAL_TABLET | Freq: Every day | ORAL | 0 refills | Status: AC
  Filled 2022-02-23: qty 20, 5d supply, fill #0

## 2022-02-23 NOTE — Telephone Encounter (Signed)
Please call in doxycycline 100 mg twice daily and prednisone 40 mg a day for 5 days

## 2022-02-23 NOTE — Telephone Encounter (Signed)
Called and spoke with patient who states that she is having increased shortness of breath, cough and mucus. Would like antibiotic called in. Patient states that today her cough became productive with green sputum. Patient is requesting to not have same medication that was sent in last time because it was a "horse pill" and she was not able to swallow it. Pharmacy is [facility].  Dr. Vaughan Browner please advise

## 2022-02-23 NOTE — Telephone Encounter (Signed)
I called the patient and she is aware of the provider recommendations and prescriptions have been sent in. Nothing further needed.

## 2022-02-26 ENCOUNTER — Other Ambulatory Visit (HOSPITAL_BASED_OUTPATIENT_CLINIC_OR_DEPARTMENT_OTHER): Payer: Self-pay

## 2022-03-01 DIAGNOSIS — J449 Chronic obstructive pulmonary disease, unspecified: Secondary | ICD-10-CM | POA: Diagnosis not present

## 2022-03-01 DIAGNOSIS — Z7952 Long term (current) use of systemic steroids: Secondary | ICD-10-CM | POA: Diagnosis not present

## 2022-03-01 DIAGNOSIS — H25812 Combined forms of age-related cataract, left eye: Secondary | ICD-10-CM | POA: Diagnosis not present

## 2022-03-01 DIAGNOSIS — Z87891 Personal history of nicotine dependence: Secondary | ICD-10-CM | POA: Diagnosis not present

## 2022-03-01 DIAGNOSIS — E785 Hyperlipidemia, unspecified: Secondary | ICD-10-CM | POA: Diagnosis not present

## 2022-03-01 DIAGNOSIS — Z79899 Other long term (current) drug therapy: Secondary | ICD-10-CM | POA: Diagnosis not present

## 2022-03-01 DIAGNOSIS — H353130 Nonexudative age-related macular degeneration, bilateral, stage unspecified: Secondary | ICD-10-CM | POA: Diagnosis not present

## 2022-03-01 DIAGNOSIS — Z7982 Long term (current) use of aspirin: Secondary | ICD-10-CM | POA: Diagnosis not present

## 2022-03-01 DIAGNOSIS — Z9981 Dependence on supplemental oxygen: Secondary | ICD-10-CM | POA: Diagnosis not present

## 2022-03-07 ENCOUNTER — Telehealth: Payer: Self-pay | Admitting: Pulmonary Disease

## 2022-03-07 NOTE — Telephone Encounter (Signed)
Pt needs to be scheduled for an OV to further evaluate. Called and spoke with pt letting her know this and she verbalized understanding. Pt has been scheduled for an acute visit with JE 6/1. Nothing further needed.

## 2022-03-08 ENCOUNTER — Ambulatory Visit (INDEPENDENT_AMBULATORY_CARE_PROVIDER_SITE_OTHER): Payer: Medicare HMO | Admitting: Pulmonary Disease

## 2022-03-08 ENCOUNTER — Other Ambulatory Visit (HOSPITAL_BASED_OUTPATIENT_CLINIC_OR_DEPARTMENT_OTHER): Payer: Self-pay

## 2022-03-08 ENCOUNTER — Encounter: Payer: Self-pay | Admitting: Pulmonary Disease

## 2022-03-08 VITALS — BP 132/68 | HR 114 | Ht 64.0 in | Wt 121.2 lb

## 2022-03-08 DIAGNOSIS — J9611 Chronic respiratory failure with hypoxia: Secondary | ICD-10-CM

## 2022-03-08 DIAGNOSIS — J441 Chronic obstructive pulmonary disease with (acute) exacerbation: Secondary | ICD-10-CM | POA: Diagnosis not present

## 2022-03-08 MED ORDER — LEVOFLOXACIN 25 MG/ML PO SOLN
500.0000 mg | Freq: Two times a day (BID) | ORAL | 0 refills | Status: AC
Start: 2022-03-08 — End: 2022-03-17
  Filled 2022-03-08: qty 300, 8d supply, fill #0

## 2022-03-08 MED ORDER — BUDESONIDE 0.5 MG/2ML IN SUSP
0.5000 mg | Freq: Two times a day (BID) | RESPIRATORY_TRACT | 5 refills | Status: DC
  Filled 2022-03-08: qty 120, 30d supply, fill #0

## 2022-03-08 MED ORDER — FORMOTEROL FUMARATE 20 MCG/2ML IN NEBU
20.0000 ug | INHALATION_SOLUTION | Freq: Two times a day (BID) | RESPIRATORY_TRACT | 5 refills | Status: DC
Start: 2022-03-08 — End: 2022-05-23
  Filled 2022-03-08: qty 120, 30d supply, fill #0

## 2022-03-08 MED ORDER — PREDNISONE 10 MG PO TABS
40.0000 mg | ORAL_TABLET | Freq: Every day | ORAL | 0 refills | Status: AC
  Filled 2022-03-08: qty 20, 5d supply, fill #0

## 2022-03-08 NOTE — Progress Notes (Signed)
Margaret Henry    [id number]    1951/03/04  Primary Care Physician:Newlin, Charlane Ferretti, MD  Referring Physician: [name], MD [place],  [place] [id number]  Chief complaint:   Chief Complaint  Patient presents with   Follow-up     HPI: 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 Her symptoms consist mainly of shortness of breath, daily cough with whitish sputum production, wheeze, dyspnea and exertion. She does not have any hemoptysis, chest pain, palpitations. No fevers, chills.   She is currently maintained on Spiriva and breo.  Tried on Trelegy inhaler in 2019 but had to go back to [address] and Spiriva since it made her breathing worse.  Also tried on Daliresp in 2019 but had to stop due to side effects of nausea  Continues on Breo and Spiriva She had tried Trelegy again in early 2022 but it made her breathing worse She got a course of prednisone in March 2022 for mild COPD exacerbation.  She has been struggling with recurrent COPD exacerbations for the past 3 months Had bilateral pneumonia on chest x-ray primary care in September 2022 and was given a course of Augmentin, levofloxacin with minimal improvement in symptoms.  She continues to struggle with daily cough.  She received several rounds of antibiotics and additional Z-Pak through our office Given breztri from the office but she did not like it and is back to [place] and Spiriva  Evaluated in the ED on 08/13/2021 with chest x-ray showing clear lungs  She had smoked one pack per day for nearly 25 years. Quit in 2020. 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 She does not recall any particular exposures at work or at home.  Interim history: 71 year old former smoker who presents for acute visit. She is followed by Dr. Vaughan Browner for COPD.  Notes in EMR reviewed.  She was recently treated for COPD  exacerbation April x2.  On daily prednisone 10 mg and azithromycin.  On telephone encounter 5/19 she reported worsening shortness of breath, cough and mucus.  Doxycycline and prednisone was called in.  Her symptoms have persistent despite treatment. She has thick green sputum. Denies fevers, chills. She is currently on 2.5L O2 at home with O2 range 95%. Has difficulty bringing up sputum and persistent cough. Had eye surgery last week and thinks that this may have worsened her respiratory symptoms. She uses Duonebs four times a day. Compliant with Spiriva and Breo but does not feel like it is effective anymore.    Outpatient Encounter Medications as of 03/08/2022  Medication Sig   acetaminophen (TYLENOL) 500 MG tablet Take 1,500 mg by mouth every 4 (four) hours as needed for headache.    albuterol (VENTOLIN HFA) 108 (90 Base) MCG/ACT inhaler Inhale 2 puffs into the lungs every 6 (six) hours as needed for wheezing or shortness of breath.   aspirin (ASPIRIN ADULT LOW STRENGTH) 81 MG EC tablet TAKE 1 TABLET (81 MG TOTAL) BY MOUTH DAILY. (Patient taking differently: Take 81 mg by mouth at bedtime.)   atorvastatin (LIPITOR) 40 MG tablet TAKE 1 TABLET EVERY DAY   azithromycin (ZITHROMAX) 250 MG tablet Take 1 tablet (250 mg total) by mouth daily.   budesonide (PULMICORT) 0.5 MG/2ML nebulizer solution Take 2 mLs (0.5 mg total) by nebulization in the morning and at bedtime.   fluticasone (FLONASE)  50 MCG/ACT nasal spray USE 2 SPRAYS IN EACH NOSTRIL EVERY DAY (Patient taking differently: Place 2 sprays into both nostrils daily as needed for allergies.)   formoterol (PERFOROMIST) 20 MCG/2ML nebulizer solution Take 2 mLs (20 mcg total) by nebulization 2 (two) times daily.   ipratropium-albuterol (DUONEB) 0.5-2.5 (3) MG/3ML SOLN Take 3 mLs by nebulization every 6 (six) hours as needed. (Patient taking differently: Take 3 mLs by nebulization every 6 (six) hours as needed (Wheezing).)   levofloxacin (LEVAQUIN) 25 MG/ML  solution Take 20 mLs (500 mg total) by mouth in the morning and at bedtime for 7 days.   loratadine (CLARITIN) 10 MG tablet TAKE 1 TABLET (10 MG TOTAL) BY MOUTH DAILY.   OXYGEN Inhale 2.5 L into the lungs daily.   predniSONE (DELTASONE) 10 MG tablet Take 4 tablets (40 mg total) by mouth daily with breakfast for 5 days.   Tiotropium Bromide Monohydrate (SPIRIVA RESPIMAT) 2.5 MCG/ACT AERS Inhale 2 puffs into the lungs daily.   [DISCONTINUED] fluticasone furoate-vilanterol (BREO ELLIPTA) 200-25 MCG/ACT AEPB INHALE 1 PUFF EVERY DAY   [DISCONTINUED] levofloxacin (LEVAQUIN) 750 MG tablet Take 1 tablet (750 mg total) by mouth daily.   [DISCONTINUED] doxycycline (VIBRA-TABS) 100 MG tablet Take 1 tablet (100 mg total) by mouth 2 (two) times daily. (Patient not taking: Reported on 03/08/2022)   No facility-administered encounter medications on file as of 03/08/2022.   Physical Exam: Blood pressure 132/68, pulse (!) 114, height '5\' 4"'$  (1.626 m), weight 121 lb 3.2 oz (55 kg), SpO2 90 %.  Physical Exam: General: Chronically ill-appearing, no acute distress HENT: [place], AT Eyes: EOMI, no scleral icterus Respiratory: Bilateral rhonchi in the lower lobes, diminished air entry, scattered wheezes Cardiovascular: RRR, -M/R/G, no JVD Extremities:-Edema,-tenderness Neuro: AAO x4, CNII-XII grossly intact Psych: Normal mood, normal affect  Data Reviewed: Imaging: Screening CT chest 03/07/18- calcified right thyroid nodule.  Severe centrilobular emphysema, bronchial wall thickening.  Previously described 4.8 mm right lower lobe nodule now measures 2.9 mm.  Granulomatous splenic and liver calcification.  CT 10/20/2019-resolution of left lower lobe pulmonary nodule, no other lung abnormality   Low-dose screening CT 10/20/2020-new 5.9 mm right lower lobe pulmonary nodule, emphysema, atherosclerosis, gallstones.  Low-dose screening CT 04/24/2021-improvement of right lower lobe pulmonary nodule, emphysema  Chest x-ray  06/30/2021-bilateral infiltrates Chest x-ray 07/12/2021-improvement in infiltrates Chest x-ray 08/13/2021-clear lungs Chest x-ray 01/10/2022-reticular opacities in the lung.   I have reviewed images personally  PFTs (07/21/15) FVC 2.06 66%), FEV1 0.93 (39%), F/F 45, DLCO 36% Unable complete pleth. Severe obstructive lung disease, severe diffusion impairment  Spirometry 04/12/16 FVC 1.79 [5 7%), FEV1 0.65 (27%), F/F 36 Very severe obstructive airway disease  Labs: CBC 12/03/2018-WBC 9.2, eos 0% ID 10/16/18-1931 Alpha-1 antitrypsin 11/05/2018-171, PI MM  Assessment:  Severe COPD,Gold Stage D, Chronic bronchitis - exacerbation Unable to tolerate large PO pills. Will order suspension Has not tolerated Daliresp due to side effects Continue supplemental oxygen She has finished pulmonary rehab 2 times already.  Encouraged her to stay active at home  Unfortunately she is suffering with recurrent exacerbations over the past year.  Now with ongoing exacerbation.  Chest x-ray from earlier this month reviewed with reticular opacities.  Repeat chest x-ray. Prescribe levofloxacin and another prednisone burst of 40 mg a day for 5 days  On daily azithromycin, prednisone 10 mg a day Codeine cough syrup for persistent cough Continue flutter valve daily and hypertonic saline to help with mucociliary clearance  Pulmonary nodule Continue annual low-dose screening CT  Goals of care We discussed her overall worsening respiratory status and frequent exacerbations.  We will make referral to palliative care Does not want to consider DNI yet but does say that she would not want to be supported long-term if there is no recovery  Plan/Recommendations: - STOP inhalers - START prednisone 40 x 5 days - START PO suspension levofloxacin 500 mg BID x 7 days. Hold azithromycin - START nebulizer medications:  --Pulmicort TWICE daily  --Performist TWICE daily - Daily azithromycin, prednisone - Cough syrup -  Flutter valve, hypertonic saline  Follow-up with Dr. Vaughan Browner in 2-3 weeks  Rodman Pickle, MD [facility] 03/08/2022, 7:52 PM  CC: [name], MD

## 2022-03-08 NOTE — Patient Instructions (Signed)
Plan/Recommendations: - STOP inhalers - START prednisone 40 x 5 days - START PO suspension levofloxacin 500 mg BID x 7 days. Hold azithromycin - START nebulizer medications:  --Pulmicort TWICE daily  --Performist TWICE daily - Daily azithromycin, prednisone - Cough syrup - Flutter valve, hypertonic saline  Follow-up with Dr. Vaughan Browner or NP in 3 weeks

## 2022-03-09 ENCOUNTER — Other Ambulatory Visit (HOSPITAL_BASED_OUTPATIENT_CLINIC_OR_DEPARTMENT_OTHER): Payer: Self-pay

## 2022-03-11 ENCOUNTER — Other Ambulatory Visit (HOSPITAL_COMMUNITY): Payer: Self-pay

## 2022-03-11 ENCOUNTER — Telehealth: Payer: Self-pay | Admitting: Pharmacy Technician

## 2022-03-11 NOTE — Telephone Encounter (Signed)
Patient Advocate Encounter  Received notification from [place] that prior authorization for FORMOTEROL 20MCG (PERFOROMIST ) NEB SOL is required.   PA submitted on 6.4.23 [id number] Status is pending   [facility] will continue to follow  Luciano Cutter, CPhT Patient Advocate Phone: [phone]

## 2022-03-12 ENCOUNTER — Other Ambulatory Visit: Payer: Self-pay | Admitting: Family Medicine

## 2022-03-12 ENCOUNTER — Other Ambulatory Visit (HOSPITAL_BASED_OUTPATIENT_CLINIC_OR_DEPARTMENT_OTHER): Payer: Self-pay

## 2022-03-13 ENCOUNTER — Other Ambulatory Visit: Payer: Self-pay

## 2022-03-13 ENCOUNTER — Other Ambulatory Visit (HOSPITAL_BASED_OUTPATIENT_CLINIC_OR_DEPARTMENT_OTHER): Payer: Self-pay

## 2022-03-13 ENCOUNTER — Encounter: Payer: Self-pay | Admitting: Family Medicine

## 2022-03-13 DIAGNOSIS — J449 Chronic obstructive pulmonary disease, unspecified: Secondary | ICD-10-CM

## 2022-03-13 MED ORDER — IPRATROPIUM-ALBUTEROL 0.5-2.5 (3) MG/3ML IN SOLN: 3.0000 mL | Freq: Four times a day (QID) | RESPIRATORY_TRACT | 0 refills | Status: DC | PRN

## 2022-03-13 NOTE — Telephone Encounter (Signed)
Received notification that the request for prior authorization for FORMOTEROL 20 MCG/2ML NEB VL has been denied due to patient's part D plan not covering nebulizer solution, patient's part B plan will cover medication please send to DME supplier.

## 2022-03-20 ENCOUNTER — Telehealth: Payer: Self-pay | Admitting: Pulmonary Disease

## 2022-03-20 ENCOUNTER — Other Ambulatory Visit (HOSPITAL_BASED_OUTPATIENT_CLINIC_OR_DEPARTMENT_OTHER): Payer: Self-pay

## 2022-03-20 MED ORDER — PREDNISONE 20 MG PO TABS
40.0000 mg | ORAL_TABLET | Freq: Every day | ORAL | 0 refills | Status: DC
  Filled 2022-03-20: qty 10, 5d supply, fill #0

## 2022-03-20 MED ORDER — AMOXICILLIN-POT CLAVULANATE 875-125 MG PO TABS
1.0000 | ORAL_TABLET | Freq: Two times a day (BID) | ORAL | 0 refills | Status: DC
  Filled 2022-03-20: qty 14, 7d supply, fill #0

## 2022-03-20 NOTE — Telephone Encounter (Signed)
Called and spoke with pt who stated she has had increased SOB which began yesterday 6/13. Pt said that she is also wheezing and is coughing getting up green phlegm. Pt also stated that she has some swelling in her left foot. Asked pt if there was any pain when she touched her left foot and she said no but she said that she did have some pain in her right foot/leg which has no swelling at all to it.   Pt said that she had to use her rescue inhaler 3 times to get some relief, stated that she is using her nebs as scheduled as well as her spiriva. Pt said she is still using 2.5L O2 and said that her sats are ranging from 93-95%.  Pt feels that this might be due to being overloaded with fluid.   Dr. Vaughan Browner, please advise on this.

## 2022-03-20 NOTE — Telephone Encounter (Signed)
Called and spoke with pt letting her know the info per Dr. Vaughan Browner and she verbalized understanding. Verified preferred pharmacy and have sent meds to pharmacy for pt. Nothing further needed.

## 2022-03-20 NOTE — Telephone Encounter (Signed)
Please call in augmentin 875 mg bid for 7 days. Hold azithromycin while she is on the augmentin Send in prednsione 40 mg/day for 45 days and then continue at daily dose of '20mg'$ /day until her scheduled visit with me later this month.

## 2022-03-21 ENCOUNTER — Ambulatory Visit: Payer: Self-pay

## 2022-03-21 NOTE — Telephone Encounter (Signed)
Pt requesting LaceIt brace for left leg and foot swelling for 3-4 days. Pt stated her pulmonary doctor prescribed antibiotics. Pt mentioned aches in her legs.     Chief Complaint: Swelling to left foot only. On Prednisone and antibiotic from pulmonary. Asking for Lasix. Symptoms: Swelling Frequency: 3 days ago Pertinent Negatives: Patient denies redness or intense pain, no injury. Disposition: '[]'$ ED /'[]'$ Urgent Care (no appt availability in office) / '[]'$ Appointment(In office/virtual)/ '[]'$  [facility]/ '[]'$ Home Care/ '[]'$ Refused Recommended Disposition /'[]'$ [facility]/ '[x]'$  Follow-up with PCP Additional Notes: Please advise pt.  Answer Assessment - Initial Assessment Questions 1. ONSET: "When did the swelling start?" (e.g., minutes, hours, days)     3 days ago 2. LOCATION: "What part of the leg is swollen?"  "Are both legs swollen or just one leg?"     Left foot 3. SEVERITY: "How bad is the swelling?" (e.g., localized; mild, moderate, severe)  - Localized - small area of swelling localized to one leg  - MILD pedal edema - swelling limited to foot and ankle, pitting edema < 1/4 inch (6 mm) deep, rest and elevation eliminate most or all swelling  - MODERATE edema - swelling of lower leg to knee, pitting edema > 1/4 inch (6 mm) deep, rest and elevation only partially reduce swelling  - SEVERE edema - swelling extends above knee, facial or hand swelling present      Mild 4. REDNESS: "Does the swelling look red or infected?"     No 5. PAIN: "Is the swelling painful to touch?" If Yes, ask: "How painful is it?"   (Scale 1-10; mild, moderate or severe)     Mild - cramping 6. FEVER: "Do you have a fever?" If Yes, ask: "What is it, how was it measured, and when did it start?"      No 7. CAUSE: "What do you think is causing the leg swelling?"     Unsure 8. MEDICAL HISTORY: "Do you have a history of heart failure, kidney disease, liver failure, or cancer?"     No 9. RECURRENT SYMPTOM:  "Have you had leg swelling before?" If Yes, ask: "When was the last time?" "What happened that time?"     No 10. OTHER SYMPTOMS: "Do you have any other symptoms?" (e.g., chest pain, difficulty breathing)       No 11. PREGNANCY: "Is there any chance you are pregnant?" "When was your last menstrual period?"       No  Protocols used: Leg Swelling and Edema-A-AH

## 2022-03-23 ENCOUNTER — Other Ambulatory Visit (HOSPITAL_BASED_OUTPATIENT_CLINIC_OR_DEPARTMENT_OTHER): Payer: Self-pay

## 2022-03-23 ENCOUNTER — Telehealth: Payer: Self-pay | Admitting: Family Medicine

## 2022-03-23 MED ORDER — FUROSEMIDE 20 MG PO TABS
20.0000 mg | ORAL_TABLET | Freq: Every day | ORAL | 0 refills | Status: DC
  Filled 2022-03-23: qty 5, 5d supply, fill #0

## 2022-03-23 NOTE — Telephone Encounter (Signed)
Done

## 2022-03-23 NOTE — Telephone Encounter (Signed)
Pt spoke with NT and she stated she hasnt heard back about being able to get an RX for Lasix (waterpill) / she stated she doesnt need or didn't ask for  brace / she asked for a Rx for Lasix / please advise and [facility] pharmacy

## 2022-03-23 NOTE — Telephone Encounter (Signed)
Her insurance company would require office notes from me to justify need for brace. I also need to see her to determine what type of brace would be prescribed. She has not had a visit with me since 08/2021

## 2022-03-26 ENCOUNTER — Other Ambulatory Visit (HOSPITAL_BASED_OUTPATIENT_CLINIC_OR_DEPARTMENT_OTHER): Payer: Self-pay

## 2022-04-01 ENCOUNTER — Other Ambulatory Visit: Payer: Self-pay | Admitting: Family Medicine

## 2022-04-01 ENCOUNTER — Other Ambulatory Visit: Payer: Self-pay | Admitting: Pulmonary Disease

## 2022-04-02 ENCOUNTER — Ambulatory Visit (INDEPENDENT_AMBULATORY_CARE_PROVIDER_SITE_OTHER): Payer: Medicare HMO | Admitting: Pulmonary Disease

## 2022-04-02 ENCOUNTER — Encounter: Payer: Self-pay | Admitting: Pulmonary Disease

## 2022-04-02 ENCOUNTER — Other Ambulatory Visit (HOSPITAL_BASED_OUTPATIENT_CLINIC_OR_DEPARTMENT_OTHER): Payer: Self-pay

## 2022-04-02 VITALS — BP 118/58 | HR 110 | Temp 98.7°F | Ht 64.0 in | Wt 119.8 lb

## 2022-04-02 DIAGNOSIS — J441 Chronic obstructive pulmonary disease with (acute) exacerbation: Secondary | ICD-10-CM

## 2022-04-02 MED ORDER — AZITHROMYCIN 250 MG PO TABS
250.0000 mg | ORAL_TABLET | Freq: Every day | ORAL | 3 refills | Status: DC
  Filled 2022-04-02: qty 30, 30d supply, fill #0

## 2022-04-03 ENCOUNTER — Telehealth: Payer: Self-pay | Admitting: Pulmonary Disease

## 2022-04-03 ENCOUNTER — Other Ambulatory Visit (HOSPITAL_BASED_OUTPATIENT_CLINIC_OR_DEPARTMENT_OTHER): Payer: Self-pay

## 2022-04-03 MED ORDER — FUROSEMIDE 20 MG PO TABS
20.0000 mg | ORAL_TABLET | Freq: Every day | ORAL | 0 refills | Status: DC
  Filled 2022-04-03: qty 5, 5d supply, fill #0

## 2022-04-04 ENCOUNTER — Other Ambulatory Visit (HOSPITAL_BASED_OUTPATIENT_CLINIC_OR_DEPARTMENT_OTHER): Payer: Self-pay

## 2022-04-04 ENCOUNTER — Other Ambulatory Visit: Payer: Medicare HMO

## 2022-04-04 DIAGNOSIS — J441 Chronic obstructive pulmonary disease with (acute) exacerbation: Secondary | ICD-10-CM | POA: Diagnosis not present

## 2022-04-04 MED ORDER — PREDNISONE 10 MG PO TABS
10.0000 mg | ORAL_TABLET | Freq: Every day | ORAL | 11 refills | Status: DC
  Filled 2022-04-04: qty 30, 30d supply, fill #0
  Filled 2022-06-14: qty 30, 30d supply, fill #1
  Filled 2022-07-10: qty 30, 30d supply, fill #2

## 2022-04-04 NOTE — Telephone Encounter (Signed)
RX has been sent in for patient. Nothing further needed at this time.

## 2022-04-04 NOTE — Telephone Encounter (Signed)
She is on prednisone of 10 mg for long term, indefinitely

## 2022-04-05 ENCOUNTER — Other Ambulatory Visit: Payer: Self-pay | Admitting: Pulmonary Disease

## 2022-04-05 ENCOUNTER — Ambulatory Visit: Payer: Self-pay

## 2022-04-05 NOTE — Telephone Encounter (Signed)
  Chief Complaint: Right foot numbness and pain  Symptoms: ibid  Frequency: Past week Pertinent Negatives: Patient denies  Disposition: '[]'$ ED /'[x]'$ Urgent Care (no appt availability in office) / '[]'$ Appointment(In office/virtual)/ '[]'$  [facility]/ '[]'$ Home Care/ '[]'$ Refused Recommended Disposition /'[]'$ [facility]/ '[]'$  Follow-up with PCP Additional Notes: Pt reports that she has had pain in right foot for awhile, but now has numbness as well. Pt also reports a lot of cramping.    Summary: Referral request pt unsure of type   Pt is having issues with right foot going numb and her leg also aches. She has not seen PCP for issue, not seen PCP since 11/22. She is requesting a referral but not sure if ortho or a vascular/vein referral. Pls Call pt to discuss or advise/ [phone]      Reason for Disposition  [1] Numbness (i.e., loss of sensation) of the face, arm / hand, or leg / foot on one side of the body AND [2] gradual onset (e.g., days to weeks) AND [3] present now  Answer Assessment - Initial Assessment Questions 1. SYMPTOM: "What is the main symptom you are concerned about?" (e.g., weakness, numbness)     Right foot painful and numb 2. ONSET: "When did this start?" (minutes, hours, days; while sleeping)     1 week 3. LAST NORMAL: "When was the last time you (the patient) were normal (no symptoms)?"     unsure 4. PATTERN "Does this come and go, or has it been constant since it started?"  "Is it present now?"     Comes and goes 5. CARDIAC SYMPTOMS: "Have you had any of the following symptoms: chest pain, difficulty breathing, palpitations?"     COPD 6. NEUROLOGIC SYMPTOMS: "Have you had any of the following symptoms: headache, dizziness, vision loss, double vision, changes in speech, unsteady on your feet?"     Nope 7. OTHER SYMPTOMS: "Do you have any other symptoms?"     no 8. PREGNANCY: "Is there any chance you are pregnant?" "When was your last menstrual period?"      na  Protocols used: Neurologic Deficit-A-AH

## 2022-04-06 ENCOUNTER — Inpatient Hospital Stay (HOSPITAL_BASED_OUTPATIENT_CLINIC_OR_DEPARTMENT_OTHER)
Admission: EM | Admit: 2022-04-06 | Discharge: 2022-04-12 | DRG: 812 | Disposition: A | Discharge status: Home Health | Payer: Medicare HMO | Attending: Internal Medicine | Admitting: Internal Medicine

## 2022-04-06 ENCOUNTER — Emergency Department (HOSPITAL_BASED_OUTPATIENT_CLINIC_OR_DEPARTMENT_OTHER): Payer: Medicare HMO

## 2022-04-06 ENCOUNTER — Other Ambulatory Visit: Payer: Self-pay

## 2022-04-06 ENCOUNTER — Encounter (HOSPITAL_BASED_OUTPATIENT_CLINIC_OR_DEPARTMENT_OTHER): Payer: Self-pay | Admitting: Emergency Medicine

## 2022-04-06 DIAGNOSIS — E876 Hypokalemia: Secondary | ICD-10-CM | POA: Diagnosis present

## 2022-04-06 DIAGNOSIS — E785 Hyperlipidemia, unspecified: Secondary | ICD-10-CM | POA: Diagnosis present

## 2022-04-06 DIAGNOSIS — I2510 Atherosclerotic heart disease of native coronary artery without angina pectoris: Secondary | ICD-10-CM | POA: Diagnosis present

## 2022-04-06 DIAGNOSIS — Z7951 Long term (current) use of inhaled steroids: Secondary | ICD-10-CM | POA: Diagnosis not present

## 2022-04-06 DIAGNOSIS — Z79899 Other long term (current) drug therapy: Secondary | ICD-10-CM | POA: Diagnosis not present

## 2022-04-06 DIAGNOSIS — Z888 Allergy status to other drugs, medicaments and biological substances status: Secondary | ICD-10-CM

## 2022-04-06 DIAGNOSIS — E559 Vitamin D deficiency, unspecified: Secondary | ICD-10-CM | POA: Diagnosis present

## 2022-04-06 DIAGNOSIS — E782 Mixed hyperlipidemia: Secondary | ICD-10-CM | POA: Diagnosis not present

## 2022-04-06 DIAGNOSIS — Z85828 Personal history of other malignant neoplasm of skin: Secondary | ICD-10-CM | POA: Diagnosis not present

## 2022-04-06 DIAGNOSIS — J9611 Chronic respiratory failure with hypoxia: Secondary | ICD-10-CM | POA: Diagnosis present

## 2022-04-06 DIAGNOSIS — D649 Anemia, unspecified: Secondary | ICD-10-CM | POA: Diagnosis present

## 2022-04-06 DIAGNOSIS — M25551 Pain in right hip: Secondary | ICD-10-CM | POA: Diagnosis not present

## 2022-04-06 DIAGNOSIS — J441 Chronic obstructive pulmonary disease with (acute) exacerbation: Secondary | ICD-10-CM | POA: Diagnosis present

## 2022-04-06 DIAGNOSIS — Z885 Allergy status to narcotic agent status: Secondary | ICD-10-CM | POA: Diagnosis not present

## 2022-04-06 DIAGNOSIS — Z9981 Dependence on supplemental oxygen: Secondary | ICD-10-CM | POA: Diagnosis not present

## 2022-04-06 DIAGNOSIS — M79604 Pain in right leg: Secondary | ICD-10-CM | POA: Diagnosis not present

## 2022-04-06 DIAGNOSIS — R600 Localized edema: Secondary | ICD-10-CM | POA: Diagnosis present

## 2022-04-06 DIAGNOSIS — K219 Gastro-esophageal reflux disease without esophagitis: Secondary | ICD-10-CM | POA: Diagnosis present

## 2022-04-06 DIAGNOSIS — J449 Chronic obstructive pulmonary disease, unspecified: Secondary | ICD-10-CM | POA: Diagnosis not present

## 2022-04-06 DIAGNOSIS — Z8616 Personal history of COVID-19: Secondary | ICD-10-CM

## 2022-04-06 DIAGNOSIS — Z90710 Acquired absence of both cervix and uterus: Secondary | ICD-10-CM | POA: Diagnosis not present

## 2022-04-06 DIAGNOSIS — D509 Iron deficiency anemia, unspecified: Secondary | ICD-10-CM

## 2022-04-06 DIAGNOSIS — R200 Anesthesia of skin: Secondary | ICD-10-CM | POA: Diagnosis not present

## 2022-04-06 DIAGNOSIS — R609 Edema, unspecified: Secondary | ICD-10-CM | POA: Diagnosis not present

## 2022-04-06 DIAGNOSIS — N3281 Overactive bladder: Secondary | ICD-10-CM | POA: Diagnosis present

## 2022-04-06 DIAGNOSIS — Z825 Family history of asthma and other chronic lower respiratory diseases: Secondary | ICD-10-CM

## 2022-04-06 DIAGNOSIS — G629 Polyneuropathy, unspecified: Secondary | ICD-10-CM | POA: Diagnosis present

## 2022-04-06 DIAGNOSIS — Z90722 Acquired absence of ovaries, bilateral: Secondary | ICD-10-CM | POA: Diagnosis not present

## 2022-04-06 DIAGNOSIS — D519 Vitamin B12 deficiency anemia, unspecified: Principal | ICD-10-CM

## 2022-04-06 DIAGNOSIS — Z7982 Long term (current) use of aspirin: Secondary | ICD-10-CM

## 2022-04-06 DIAGNOSIS — J9610 Chronic respiratory failure, unspecified whether with hypoxia or hypercapnia: Secondary | ICD-10-CM | POA: Diagnosis present

## 2022-04-06 DIAGNOSIS — Z87891 Personal history of nicotine dependence: Secondary | ICD-10-CM

## 2022-04-06 DIAGNOSIS — D508 Other iron deficiency anemias: Secondary | ICD-10-CM | POA: Diagnosis not present

## 2022-04-06 DIAGNOSIS — J9621 Acute and chronic respiratory failure with hypoxia: Secondary | ICD-10-CM

## 2022-04-06 DIAGNOSIS — M810 Age-related osteoporosis without current pathological fracture: Secondary | ICD-10-CM | POA: Diagnosis present

## 2022-04-06 DIAGNOSIS — R0602 Shortness of breath: Secondary | ICD-10-CM | POA: Diagnosis not present

## 2022-04-06 DIAGNOSIS — S7001XA Contusion of right hip, initial encounter: Secondary | ICD-10-CM | POA: Diagnosis present

## 2022-04-06 DIAGNOSIS — R2242 Localized swelling, mass and lump, left lower limb: Secondary | ICD-10-CM | POA: Diagnosis not present

## 2022-04-06 LAB — CBC
HCT: 23.5 % — ABNORMAL LOW (ref 36.0–46.0)
Hemoglobin: 6.1 g/dL — CL (ref 12.0–15.0)
MCH: 18.2 pg — ABNORMAL LOW (ref 26.0–34.0)
MCHC: 26 g/dL — ABNORMAL LOW (ref 30.0–36.0)
MCV: 70.1 fL — ABNORMAL LOW (ref 80.0–100.0)
Platelets: 364 10*3/uL (ref 150–400)
RBC: 3.35 MIL/uL — ABNORMAL LOW (ref 3.87–5.11)
RDW: 20.1 % — ABNORMAL HIGH (ref 11.5–15.5)
WBC: 9.2 10*3/uL (ref 4.0–10.5)
nRBC: 0.2 % (ref 0.0–0.2)

## 2022-04-06 LAB — RETICULOCYTES
Immature Retic Fract: 25.7 % — ABNORMAL HIGH (ref 2.3–15.9)
RBC.: 3.23 MIL/uL — ABNORMAL LOW (ref 3.87–5.11)
Retic Count, Absolute: 67.2 10*3/uL (ref 19.0–186.0)
Retic Ct Pct: 2.1 % (ref 0.4–3.1)

## 2022-04-06 LAB — BASIC METABOLIC PANEL
Anion gap: 7 (ref 5–15)
BUN: 7 mg/dL — ABNORMAL LOW (ref 8–23)
CO2: 27 mmol/L (ref 22–32)
Calcium: 8.8 mg/dL — ABNORMAL LOW (ref 8.9–10.3)
Chloride: 105 mmol/L (ref 98–111)
Creatinine, Ser: 0.58 mg/dL (ref 0.44–1.00)
GFR, Estimated: >60 mL/min (ref >60)
Glucose, Bld: 101 mg/dL — ABNORMAL HIGH (ref 70–99)
Potassium: 3.6 mmol/L (ref 3.5–5.1)
Sodium: 139 mmol/L (ref 135–145)

## 2022-04-06 LAB — ABO/RH: ABO/RH(D): A POS

## 2022-04-06 LAB — PREPARE RBC (CROSSMATCH)

## 2022-04-06 LAB — OCCULT BLOOD X 1 CARD TO LAB, STOOL: Fecal Occult Bld: NEGATIVE

## 2022-04-06 LAB — FOLATE: Folate: 16.1 ng/mL (ref >5.9)

## 2022-04-06 MED ORDER — TIOTROPIUM BROMIDE MONOHYDRATE 2.5 MCG/ACT IN AERS: INHALATION_SPRAY | Freq: Every day | RESPIRATORY_TRACT | Status: DC

## 2022-04-06 MED ORDER — PREDNISONE 5 MG PO TABS
10.0000 mg | ORAL_TABLET | Freq: Every day | ORAL | Status: DC
  Administered 2022-04-07 – 2022-04-12 (×6): 10 mg via ORAL
  Filled 2022-04-06 (×6): qty 2

## 2022-04-06 MED ORDER — ALBUTEROL SULFATE (2.5 MG/3ML) 0.083% IN NEBU
3.0000 mL | INHALATION_SOLUTION | Freq: Four times a day (QID) | RESPIRATORY_TRACT | Status: DC | PRN
  Administered 2022-04-07: 3 mL via RESPIRATORY_TRACT
  Filled 2022-04-06: qty 3

## 2022-04-06 MED ORDER — IPRATROPIUM-ALBUTEROL 0.5-2.5 (3) MG/3ML IN SOLN
3.0000 mL | Freq: Four times a day (QID) | RESPIRATORY_TRACT | Status: DC
  Administered 2022-04-06: 3 mL via RESPIRATORY_TRACT
  Filled 2022-04-06: qty 3

## 2022-04-06 MED ORDER — UMECLIDINIUM BROMIDE 62.5 MCG/ACT IN AEPB
1.0000 | INHALATION_SPRAY | Freq: Every day | RESPIRATORY_TRACT | Status: DC
  Filled 2022-04-06: qty 7

## 2022-04-06 MED ORDER — ASPIRIN 81 MG PO TBEC
81.0000 mg | DELAYED_RELEASE_TABLET | Freq: Every day | ORAL | Status: DC
  Administered 2022-04-07: 81 mg via ORAL
  Filled 2022-04-06 (×2): qty 1

## 2022-04-06 MED ORDER — ARFORMOTEROL TARTRATE 15 MCG/2ML IN NEBU: 15.0000 ug | INHALATION_SOLUTION | Freq: Two times a day (BID) | RESPIRATORY_TRACT | Status: DC

## 2022-04-06 MED ORDER — FUROSEMIDE 20 MG PO TABS
20.0000 mg | ORAL_TABLET | Freq: Every day | ORAL | Status: DC
  Administered 2022-04-07 – 2022-04-12 (×6): 20 mg via ORAL
  Filled 2022-04-06 (×6): qty 1

## 2022-04-06 MED ORDER — SODIUM CHLORIDE 0.9% FLUSH
3.0000 mL | Freq: Two times a day (BID) | INTRAVENOUS | Status: DC
Start: 2022-04-06 — End: 2022-04-12
  Administered 2022-04-06 – 2022-04-12 (×12): 3 mL via INTRAVENOUS

## 2022-04-06 MED ORDER — SODIUM CHLORIDE 0.9% IV SOLUTION: Freq: Once | INTRAVENOUS | Status: DC

## 2022-04-06 MED ORDER — ATORVASTATIN CALCIUM 40 MG PO TABS
40.0000 mg | ORAL_TABLET | Freq: Every day | ORAL | Status: DC
  Administered 2022-04-07 – 2022-04-12 (×6): 40 mg via ORAL
  Filled 2022-04-06 (×6): qty 1

## 2022-04-06 MED ORDER — AZITHROMYCIN 250 MG PO TABS
250.0000 mg | ORAL_TABLET | Freq: Every day | ORAL | Status: DC
  Administered 2022-04-07 – 2022-04-12 (×6): 250 mg via ORAL
  Filled 2022-04-06 (×6): qty 1

## 2022-04-06 MED ORDER — FLUTICASONE FUROATE-VILANTEROL 200-25 MCG/ACT IN AEPB
1.0000 | INHALATION_SPRAY | Freq: Every day | RESPIRATORY_TRACT | Status: DC
  Filled 2022-04-06: qty 28

## 2022-04-06 MED ORDER — ACETAMINOPHEN 325 MG PO TABS
650.0000 mg | ORAL_TABLET | Freq: Four times a day (QID) | ORAL | Status: DC | PRN
  Administered 2022-04-07 – 2022-04-12 (×7): 650 mg via ORAL
  Filled 2022-04-06 (×8): qty 2

## 2022-04-06 NOTE — ED Notes (Signed)
ED TO INPATIENT HANDOFF REPORT  ED Nurse Name and Phone #:  Margaret Henry Name/Age/Gender Margaret Henry 71 y.o. female Room/Bed: MH05/MH05  Code Status   Code Status: Prior  Home/SNF/Other Home Patient oriented to: self, place, time, and situation Is this baseline? Yes   Triage Complete: Triage complete  Chief Complaint Symptomatic anemia [D64.9]  Triage Note Patient presents to ED via POV from [facility]. Patient reports right leg numbness x 1 week. No slurred speech. No facial droop. Also reports bilateral leg pain "for months".   Allergies Allergies  Allergen Reactions   Hydrocodone Nausea And Vomiting   Propoxyphene N-Acetaminophen Nausea And Vomiting    Level of Care/Admitting Diagnosis ED Disposition     ED Disposition  Admit   Condition  --   Comment  Hospital Area: [facility] [[id number]]  Level of Care: Telemetry [5]  Admit to tele based on following criteria: Complex arrhythmia (Bradycardia/Tachycardia)  Interfacility transfer: Yes  May place patient in observation at [facility] or [facility] if equivalent level of care is available:: Yes  Covid Evaluation: Asymptomatic - no recent exposure (last 10 days) testing not required  Diagnosis: Symptomatic anemia [6283662]  Admitting Physician: Antonieta Pert [[id number]]  Attending Physician: Lennice Sites [[id number]]          B Medical/Surgery History Past Medical History:  Diagnosis Date   Allergy    hayfever   Bronchitis    Cancer ([place])    skin cancer on chest   Colon polyps    Complication of anesthesia    pt states was given too much during nasal surgery 1989; difficulty getting awake   COPD (chronic obstructive pulmonary disease) ([place])    Coronary artery calcification    Diverticulitis    GERD (gastroesophageal reflux disease)    occasional   Hemorrhoids    High cholesterol    Hyperlipidemia LDL goal <70    Mild aortic insufficiency    Osteoporosis    Pneumonia     Shortness of breath dyspnea    with exertion    Thyroid goiter    bx benign   Tobacco abuse    Vertigo    Past Surgical History:  Procedure Laterality Date   ABDOMINAL HYSTERECTOMY     BILATERAL OOPHORECTOMY  2001   for benign ovarian mass    BIOPSY THYROID     DENTAL SURGERY     dentures   NASAL SEPTUM SURGERY     PARTIAL HYSTERECTOMY  1976   for heavy menses    RECTAL EXAM UNDER ANESTHESIA N/A 12/14/2015   Procedure: RECTAL EXAM UNDER ANESTHESIA REMOVAL OF ANAL CANAL MASS; INTERNAL HEMORRHOID LIGATION, EXTERNAL HEMORRHOID LIGATION;  Surgeon: Michael Boston, MD;  Location: [facility];  Service: General;  Laterality: N/A;   TUBAL LIGATION     WISDOM TOOTH EXTRACTION       A IV Location/Drains/Wounds Patient Lines/Drains/Airways Status     Active Line/Drains/Airways     Name Placement date Placement time Site Days   Peripheral IV 04/06/22 20 G Right Antecubital 04/06/22  1504  Antecubital  less than 1            Intake/Output Last 24 hours No intake or output data in the 24 hours ending 04/06/22 1749  Labs/Imaging Results for orders placed or performed during the hospital encounter of 04/06/22 (from the past 48 hour(s))  CBC     Status: Abnormal   Collection Time: 04/06/22  3:00 PM  Result Value  Ref Range   WBC 9.2 4.0 - 10.5 K/uL   RBC 3.35 (L) 3.87 - 5.11 MIL/uL   Hemoglobin 6.1 (LL) 12.0 - 15.0 g/dL    Comment: REPEATED TO VERIFY THIS CRITICAL RESULT HAS VERIFIED AND BEEN CALLED TO NANNY,M RN BY CAMILLA EDENS ON 06 30 2023 AT 1540, AND HAS BEEN READ BACK.     HCT 23.5 (L) 36.0 - 46.0 %   MCV 70.1 (L) 80.0 - 100.0 fL   MCH 18.2 (L) 26.0 - 34.0 pg   MCHC 26.0 (L) 30.0 - 36.0 g/dL   RDW 20.1 (H) 11.5 - 15.5 %   Platelets 364 150 - 400 K/uL   nRBC 0.2 0.0 - 0.2 %    Comment: Performed at [facility], [address] [place], [place] [address]  Basic metabolic panel     Status: Abnormal   Collection Time: 04/06/22  3:00 PM  Result Value Ref Range    Sodium 139 135 - 145 mmol/L   Potassium 3.6 3.5 - 5.1 mmol/L   Chloride 105 98 - 111 mmol/L   CO2 27 22 - 32 mmol/L   Glucose, Bld 101 (H) 70 - 99 mg/dL    Comment: Glucose reference range applies only to samples taken after fasting for at least 8 hours.   BUN 7 (L) 8 - 23 mg/dL   Creatinine, Ser 0.58 0.44 - 1.00 mg/dL   Calcium 8.8 (L) 8.9 - 10.3 mg/dL   GFR, Estimated >60 >60 mL/min    Comment: (NOTE) Calculated using the CKD-EPI Creatinine Equation (2021)    Anion gap 7 5 - 15    Comment: Performed at [facility], [address] [place], [place] [address]  Occult blood card to lab, stool     Status: None   Collection Time: 04/06/22  3:55 PM  Result Value Ref Range   Fecal Occult Bld NEGATIVE NEGATIVE    Comment: Performed at [facility], [address], [place], [place] [address]   DG Chest 2 View  Result Date: 04/06/2022 CLINICAL DATA:  Shortness of breath.  Right leg numbness. EXAM: CHEST - 2 VIEW COMPARISON:  Two-view chest x-ray 01/30/2022. FINDINGS: Heart size normal. Atherosclerotic calcifications are present in the aortic arch. Changes of COPD noted. No edema or effusion is present. No focal airspace disease is present. IMPRESSION: 1. COPD. 2. No acute cardiopulmonary disease. Electronically Signed   By: San Morelle M.D.   On: 04/06/2022 15:16   US Venous Img Lower Right (DVT Study)  Result Date: 04/06/2022 CLINICAL DATA:  Right leg pain EXAM: RIGHT LOWER EXTREMITY VENOUS DOPPLER ULTRASOUND TECHNIQUE: Gray-scale sonography with graded compression, as well as color Doppler and duplex ultrasound were performed to evaluate the lower extremity deep venous systems from the level of the common femoral vein and including the common femoral, femoral, profunda femoral, popliteal and calf veins including the posterior tibial, peroneal and gastrocnemius veins when visible. Spectral Doppler was utilized to evaluate flow at rest and with distal augmentation  maneuvers in the common femoral, femoral and popliteal veins. COMPARISON:  None Available. FINDINGS: Contralateral Common Femoral Vein: Respiratory phasicity is normal and symmetric with the symptomatic side. No evidence of thrombus. Normal compressibility. Common Femoral Vein: No evidence of thrombus. Normal compressibility, respiratory phasicity and response to augmentation. Saphenofemoral Junction: No evidence of thrombus. Normal compressibility and flow on color Doppler imaging. Profunda Femoral Vein: No evidence of thrombus. Normal compressibility and flow on  color Doppler imaging. Femoral Vein: No evidence of thrombus. Normal compressibility, respiratory phasicity and response to augmentation. Popliteal Vein: No evidence of thrombus. Normal compressibility, respiratory phasicity and response to augmentation. Calf Veins: No evidence of thrombus. Normal compressibility and flow on color Doppler imaging. IMPRESSION: No evidence of deep venous thrombosis. Electronically Signed   By: Jerilynn Mages.  Shick M.D.   On: 04/06/2022 14:33    Pending Labs Unresulted Labs (From admission, onward)     Start     Ordered   04/06/22 1612  Vitamin B12  (Anemia Panel (PNL))  Once,   URGENT        04/06/22 1611   04/06/22 1612  Folate  (Anemia Panel (PNL))  Once,   URGENT        04/06/22 1611   04/06/22 1612  Iron and TIBC  (Anemia Panel (PNL))  Once,   URGENT        04/06/22 1611   04/06/22 1612  Ferritin  (Anemia Panel (PNL))  Once,   URGENT        04/06/22 1611   04/06/22 1612  Reticulocytes  (Anemia Panel (PNL))  Once,   URGENT        04/06/22 1611            Vitals/Pain Today's Vitals   04/06/22 1435 04/06/22 1623 04/06/22 1630 04/06/22 1700  BP: (!) 125/50 (!) 119/56 (!) 147/55   Pulse: 88 95 93 99  Resp: '13 15 20 18  '$ Temp:      TempSrc:      SpO2: 98% 100% 100% 100%  PainSc:        Isolation Precautions No active isolations  Medications Medications - No data to display  Mobility walks with  device Low fall risk   Focused Assessments.  O2 at 3 l/[place]     R Recommendations: See Admitting Provider Note  Report given to:   Additional Notes:  # 20 right AC Type and screen needs to be collected

## 2022-04-06 NOTE — ED Notes (Signed)
Patient place on 3lpm [place], patient home 02 cylinder running low.

## 2022-04-06 NOTE — Hospital Course (Addendum)
70-yof with history of COPD with chronic hypoxia on nasal cannula 2.5 L every now and then history of COVID infection,HLD,GERD,thyroid goiter,colon polyp/hemorrhoids,mild aortic insufficiency on aspirin 81, Lipitor 40 came to the ED med [place] with right leg numbness x1 week,no speech disease facial droop, has bilateral leg pain "for months"-called her PCP who advised her to go to urgent care.Has been short of breath for several days, diagnosed with pneumonia several months ago. In the ED x-ray considered COPD no consolidation or effusion, labs shows microcytic anemia hemoglobin 6.1 stable BMP BUN 7 Hemoccult negative. Leg [place] neg for Dvt. Anemai panel pending. Requesting admission for symptomatic anemia.

## 2022-04-06 NOTE — ED Provider Notes (Cosign Needed Addendum)
[facility] EMERGENCY DEPARTMENT Provider Note   CSN: [id number] Arrival date & time: 04/06/22  1224     History  Chief Complaint  Patient presents with   Numbness   Shortness of Breath    Margaret Henry is a 71 y.o. female who presents to the emergency department with multiple complaints.  Patient initially came in complaining of right leg pain for several months, with right foot numbness for the past week.  Patient had called her primary doctor and they recommended that she go to urgent care.  They could not obtain the ultrasound, so they sent her to the ER for evaluation.  She is also complaining of some more shortness of breath.  History of COPD and states that she has been more short of breath for several days.  Was diagnosed with pneumonia several months ago, and states that she is "been battling it ever since".  Still has a productive cough lingering from September.  Normally wears 2-1/2 L oxygen at home every now and then, but has been requiring 3 L today.  HPI     Home Medications Prior to Admission medications   Medication Sig Start Date End Date Taking? Authorizing Provider  acetaminophen (TYLENOL) 500 MG tablet Take 1,500 mg by mouth every 4 (four) hours as needed for headache.     [provider]  albuterol (VENTOLIN HFA) 108 (90 Base) MCG/ACT inhaler Inhale 2 puffs into the lungs every 6 (six) hours as needed for wheezing or shortness of breath. 02/11/20   [name], MD  aspirin (ASPIRIN ADULT LOW STRENGTH) 81 MG EC tablet TAKE 1 TABLET (81 MG TOTAL) BY MOUTH DAILY. Patient taking differently: Take 81 mg by mouth at bedtime. 02/11/20   [name], MD  atorvastatin (LIPITOR) 40 MG tablet TAKE 1 TABLET EVERY DAY 12/15/21   Park Liter, MD  azithromycin (ZITHROMAX) 250 MG tablet Take 1 tablet (250 mg total) by mouth daily. 04/02/22   Mannam, Hart Robinsons, MD  budesonide (PULMICORT) 0.5 MG/2ML nebulizer solution Take 2 mLs (0.5 mg total) by  nebulization in the morning and at bedtime. Patient not taking: Reported on 04/02/2022 03/08/22   Margaretha Seeds, MD  fluticasone [facility]) 50 MCG/ACT nasal spray USE 2 SPRAYS IN [facility] NOSTRIL EVERY DAY Patient taking differently: Place 2 sprays into both nostrils daily as needed for allergies. 02/11/20   [name], MD  formoterol (PERFOROMIST) 20 MCG/2ML nebulizer solution Take 2 mLs (20 mcg total) by nebulization 2 (two) times daily. 03/08/22   Margaretha Seeds, MD  furosemide (LASIX) 20 MG tablet Take 1 tablet (20 mg total) by mouth daily. 04/03/22   [name], MD  ipratropium-albuterol (DUONEB) 0.5-2.5 (3) MG/3ML SOLN Take 3 mLs by nebulization every 6 (six) hours as needed. Patient not taking: Reported on 04/02/2022 03/13/22   [name], MD  levofloxacin [facility]) 25 MG/ML solution  03/19/22   [provider]  loratadine (CLARITIN) 10 MG tablet TAKE 1 TABLET (10 MG TOTAL) BY MOUTH DAILY. 12/17/18   Mannam, Hart Robinsons, MD  OXYGEN Inhale 2.5 L into the lungs daily.    [provider]  predniSONE (DELTASONE) 10 MG tablet Take 1 tablet (10 mg total) by mouth daily with breakfast. 04/04/22   Mannam, Praveen, MD  Tiotropium Bromide Monohydrate (SPIRIVA RESPIMAT) 2.5 MCG/ACT AERS INHALE 2 PUFFS EVERY DAY 04/05/22   Marshell Garfinkel, MD      Allergies    Hydrocodone and Propoxyphene n-acetaminophen    Review of Systems  Review of Systems  Constitutional:  Negative for fever.  Respiratory:  Positive for cough and shortness of breath.   Cardiovascular:  Positive for leg swelling. Negative for chest pain and palpitations.  Gastrointestinal:  Negative for abdominal pain.  All other systems reviewed and are negative.   Physical Exam Updated Vital Signs BP (!) 119/56   Pulse 95   Temp 99.2 F (37.3 C) (Oral)   Resp 15   SpO2 100%  Physical Exam Vitals and nursing note reviewed.  Constitutional:      Appearance: Normal appearance.  HENT:     Head: Normocephalic and  atraumatic.  Eyes:     Conjunctiva/sclera: Conjunctivae normal.  Cardiovascular:     Rate and Rhythm: Normal rate and regular rhythm.     Pulses:          Dorsalis pedis pulses are 2+ on the right side and 2+ on the left side.  Pulmonary:     Effort: Pulmonary effort is normal. No respiratory distress.     Comments: Distant lung sounds, productive cough Abdominal:     General: There is no distension.     Palpations: Abdomen is soft.     Tenderness: There is no abdominal tenderness.  Musculoskeletal:     Right lower leg: 1+ Pitting Edema present.     Left lower leg: No edema.  Skin:    General: Skin is warm and dry.     Comments: Overlying skin changes to the bilateral lower legs.  No temperature change.  Neurological:     General: No focal deficit present.     Mental Status: She is alert.     ED Results / Procedures / Treatments   Labs (all labs ordered are listed, but only abnormal results are displayed) Labs Reviewed  CBC - Abnormal; Notable for the following components:      Result Value   RBC 3.35 (*)    Hemoglobin 6.1 (*)    HCT 23.5 (*)    MCV 70.1 (*)    MCH 18.2 (*)    MCHC 26.0 (*)    RDW 20.1 (*)    All other components within normal limits  BASIC METABOLIC PANEL - Abnormal; Notable for the following components:   Glucose, Bld 101 (*)    BUN 7 (*)    Calcium 8.8 (*)    All other components within normal limits  OCCULT BLOOD X 1 CARD TO LAB, STOOL  VITAMIN B12  FOLATE  IRON AND TIBC  FERRITIN  RETICULOCYTES  POC OCCULT BLOOD, ED    EKG None  Radiology DG Chest 2 View  Result Date: 04/06/2022 CLINICAL DATA:  Shortness of breath.  Right leg numbness. EXAM: CHEST - 2 VIEW COMPARISON:  Two-view chest x-ray 01/30/2022. FINDINGS: Heart size normal. Atherosclerotic calcifications are present in the aortic arch. Changes of COPD noted. No edema or effusion is present. No focal airspace disease is present. IMPRESSION: 1. COPD. 2. No acute cardiopulmonary  disease. Electronically Signed   By: San Morelle M.D.   On: 04/06/2022 15:16   US Venous Img Lower Right (DVT Study)  Result Date: 04/06/2022 CLINICAL DATA:  Right leg pain EXAM: RIGHT LOWER EXTREMITY VENOUS DOPPLER ULTRASOUND TECHNIQUE: Gray-scale sonography with graded compression, as well as color Doppler and duplex ultrasound were performed to evaluate the lower extremity deep venous systems from the level of the common femoral vein and including the common femoral, femoral, profunda femoral, popliteal and calf veins including the posterior tibial, peroneal  and gastrocnemius veins when visible. Spectral Doppler was utilized to evaluate flow at rest and with distal augmentation maneuvers in the common femoral, femoral and popliteal veins. COMPARISON:  None Available. FINDINGS: Contralateral Common Femoral Vein: Respiratory phasicity is normal and symmetric with the symptomatic side. No evidence of thrombus. Normal compressibility. Common Femoral Vein: No evidence of thrombus. Normal compressibility, respiratory phasicity and response to augmentation. Saphenofemoral Junction: No evidence of thrombus. Normal compressibility and flow on color Doppler imaging. Profunda Femoral Vein: No evidence of thrombus. Normal compressibility and flow on color Doppler imaging. Femoral Vein: No evidence of thrombus. Normal compressibility, respiratory phasicity and response to augmentation. Popliteal Vein: No evidence of thrombus. Normal compressibility, respiratory phasicity and response to augmentation. Calf Veins: No evidence of thrombus. Normal compressibility and flow on color Doppler imaging. IMPRESSION: No evidence of deep venous thrombosis. Electronically Signed   By: Jerilynn Mages.  Shick M.D.   On: 04/06/2022 14:33    Procedures Procedures    Medications Ordered in ED Medications - No data to display  ED Course/ Medical Decision Making/ A&P                           Medical Decision Making Amount and/or  Complexity of Data Reviewed Labs: ordered. Radiology: ordered.  Risk Decision regarding hospitalization.   This patient is a 71 y.o. female  who presents to the ED for concern of right leg pain and shortness of breath.   Differential diagnoses prior to evaluation: The emergent differential diagnosis includes, but is not limited to,  Cardiac (heart failure, pericardial effusion/tamponade, arrhythmias, ischemia), Respiratory (COPD, asthma, bronchitis, pneumonia, pneumothorax, pulmonary HTN, pulmonary embolism), Hematologic (anemia), DVT, PVD, arterial occlusion. This is not an exhaustive differential.   Past Medical History / Co-morbidities: Hyperlipidemia, CAD, COPD (chronically on 2.5 L nasal cannula at home)  Additional history: Chart reviewed. Pertinent results include: Patient was admitted back in September 2022 with COPD exacerbation and acute on chronic respiratory failure likely caused by COVID-19 infection.  Also had echocardiogram at that time with preserved ejection fraction and grade 1 diastolic dysfunction.  Physical Exam: Physical exam performed. The pertinent findings include: Patient with no increased respiratory effort.  Normal oxygen saturation with 3 L nasal cannula, slightly up from patient's baseline.  Distant lung sounds, without significant adventitious sounds.  1+ pitting edema of the right leg, without overlying skin changes.  Normal distal pulses and sensation intact.  Lab Tests/Imaging studies: I personally interpreted labs/imaging and the pertinent results include: Hemoglobin of 6.1, significantly decreased compared to prior.  Electrolytes grossly within normal limits.  Anemia panel pending.  Hemoccult negative.   DVT study ordered from triage with no evidence of DVT.  X-ray with findings consistent of COPD, no focal consolidation or effusions. I agree with the radiologist interpretation.  As we do not have blood products at our location, I have ordered anemia  panel, but unable to order type and screen until patient is admitted and transferred.  Consultations obtained: I discussed with hospitalist Dr. Antonieta Pert who will admit   Disposition: After consideration of the diagnostic results and the patients response to treatment, I feel that patient is requiring medical admission for treatment and evaluation of symptomatic anemia. The patient appears reasonably stabilized for admission considering the current resources, flow, and capabilities available in the ED at this time, and I doubt any other [facility] requiring further screening and/or treatment in the ED prior to admission.  Final Clinical Impression(s) /  ED Diagnoses Final diagnoses:  Symptomatic anemia    Rx / DC Orders ED Discharge Orders     None      Portions of this report may have been transcribed using voice recognition software. Every effort was made to ensure accuracy; however, inadvertent computerized transcription errors may be present.    Kateri Plummer, PA-C 04/06/22 [place], [place], DO 04/09/22 [phone]

## 2022-04-06 NOTE — Telephone Encounter (Signed)
She will need a visit with any available Clinician or the mobile unit for further evaluation to determine appropriate management and referral if indicated

## 2022-04-06 NOTE — ED Notes (Addendum)
Pt assisted to [facility] with staff. Increased SOB with exertion. Sinus tach at 133. O2 remains at 3 L vis [place] 97%

## 2022-04-06 NOTE — ED Notes (Signed)
Blood obtained and sent to lab.

## 2022-04-06 NOTE — H&P (Signed)
History and Physical   Margaret Henry EXB:[id number] DOB: May 06, 1951 DOA: 04/06/2022  PCP: [name], MD   Patient coming from: Home  Chief Complaint: Shortness of breath, leg pain  HPI: Margaret Henry is a 71 y.o. female with medical history significant of hyperlipidemia, CAD, GERD, COPD, chronic respiratory failure, overactive bladder presenting with shortness of breath and leg pain.  Initially was primarily concerned for her right leg pain ongoing for several months with a week of right foot numbness.  Contacted her PCP who recommended urgent care visit who recommended ED visit due to needing to check ultrasound study.  Patient also reporting several days of increased shortness of breath having to increase her home oxygen from 2 and half to 3 L.  Did see pulmonology on 6/26 and began treatment for possible exacerbation.  Reports she had pneumonia several months ago and has had issues ever since with continued cough.  She denies fevers, chills, chest pain, abdominal pain, constipation, diarrhea, nausea, vomiting.  ED Course: Vital signs in the ED significant for requiring 3 L to maintain saturations, heart rate in the 90s to 100s, blood pressure in the 102V to 253G systolic.  Lab work-up included BMP with glucose 101, calcium 8.8.  CBC showed hemoglobin of 6.1 down from baseline of around 9 with MCV of 70.  FOBT was negative.  Anemia panel obtained including B12, folate, iron, ferritin which are all pending.  Reticulocyte count inappropriately normal.  Chest x-ray showed changes of COPD but no acute abnormality.  DVT study on the right was negative.  Patient received DuoNeb in the ED.  Review of Systems: As per HPI otherwise all other systems reviewed and are negative.  Past Medical History:  Diagnosis Date   Allergy    hayfever   Bronchitis    Cancer ([place])    skin cancer on chest   Chest pain 06/12/2016   Colon polyps    Complication of anesthesia    pt states was given too much  during nasal surgery 1989; difficulty getting awake   Complication of anesthesia 05/16/2021   pt states was given too much during nasal surgery 1989; difficulty getting awake   COPD (chronic obstructive pulmonary disease) ([place])    COPD exacerbation ([place]) 06/01/2013   Coronary artery calcification    Diverticulitis    Diverticulitis 05/16/2021   occasional   Dyspnea 06/27/2016   GERD (gastroesophageal reflux disease)    occasional   Hemorrhoids    High cholesterol    Hyperlipidemia LDL goal <70    Mild aortic insufficiency    Osteoporosis    Pneumonia    Shortness of breath dyspnea    with exertion    Thyroid goiter    bx benign   Tobacco abuse    Vertigo    Vertigo 04/21/2017    Past Surgical History:  Procedure Laterality Date   ABDOMINAL HYSTERECTOMY     BILATERAL OOPHORECTOMY  2001   for benign ovarian mass    BIOPSY THYROID     DENTAL SURGERY     dentures   NASAL SEPTUM SURGERY     PARTIAL HYSTERECTOMY  1976   for heavy menses    RECTAL EXAM UNDER ANESTHESIA N/A 12/14/2015   Procedure: RECTAL EXAM UNDER ANESTHESIA REMOVAL OF ANAL CANAL MASS; INTERNAL HEMORRHOID LIGATION, EXTERNAL HEMORRHOID LIGATION;  Surgeon: Michael Boston, MD;  Location: [facility];  Service: General;  Laterality: N/A;   TUBAL LIGATION     WISDOM TOOTH EXTRACTION  Social History  reports that she quit smoking about 3 years ago. Her smoking use included cigarettes. She has a 73.50 pack-year smoking history. She has never used smokeless tobacco. She reports that she does not drink alcohol and does not use drugs.  Allergies  Allergen Reactions   Hydrocodone Nausea And Vomiting   Propoxyphene N-Acetaminophen Nausea And Vomiting    Family History  Problem Relation Age of Onset   Heart disease Mother    COPD Father    Lung cancer Maternal Grandfather    Liver cancer Paternal Grandmother    Colon cancer Neg Hx    Colon polyps Neg Hx    Esophageal cancer Neg Hx    Rectal cancer Neg Hx    Stomach  cancer Neg Hx   Reviewed on admission  Prior to Admission medications   Medication Sig Start Date End Date Taking? Authorizing Provider  acetaminophen (TYLENOL) 500 MG tablet Take 1,500 mg by mouth every 4 (four) hours as needed for headache.     [provider]  albuterol (VENTOLIN HFA) 108 (90 Base) MCG/ACT inhaler Inhale 2 puffs into the lungs every 6 (six) hours as needed for wheezing or shortness of breath. 02/11/20   [name], MD  aspirin (ASPIRIN ADULT LOW STRENGTH) 81 MG EC tablet TAKE 1 TABLET (81 MG TOTAL) BY MOUTH DAILY. Patient taking differently: Take 81 mg by mouth at bedtime. 02/11/20   [name], MD  atorvastatin (LIPITOR) 40 MG tablet TAKE 1 TABLET EVERY DAY 12/15/21   Park Liter, MD  azithromycin (ZITHROMAX) 250 MG tablet Take 1 tablet (250 mg total) by mouth daily. 04/02/22   Mannam, Hart Robinsons, MD  fluticasone (FLONASE) 50 MCG/ACT nasal spray USE 2 SPRAYS IN EACH NOSTRIL EVERY DAY Patient taking differently: Place 2 sprays into both nostrils daily as needed for allergies. 02/11/20   [name], MD  formoterol (PERFOROMIST) 20 MCG/2ML nebulizer solution Take 2 mLs (20 mcg total) by nebulization 2 (two) times daily. 03/08/22   Margaretha Seeds, MD  furosemide (LASIX) 20 MG tablet Take 1 tablet (20 mg total) by mouth daily. 04/03/22   [name], MD  ipratropium-albuterol (DUONEB) 0.5-2.5 (3) MG/3ML SOLN Take 3 mLs by nebulization every 6 (six) hours as needed. Patient not taking: Reported on 04/02/2022 03/13/22   [name], MD  levofloxacin [facility]) 25 MG/ML solution  03/19/22   [provider]  loratadine (CLARITIN) 10 MG tablet TAKE 1 TABLET (10 MG TOTAL) BY MOUTH DAILY. 12/17/18   Mannam, Hart Robinsons, MD  OXYGEN Inhale 2.5 L into the lungs daily.    [provider]  predniSONE (DELTASONE) 10 MG tablet Take 1 tablet (10 mg total) by mouth daily with breakfast. 04/04/22   Mannam, Hart Robinsons, MD  Tiotropium Bromide Monohydrate (SPIRIVA  RESPIMAT) 2.5 MCG/ACT AERS INHALE 2 PUFFS EVERY DAY 04/05/22   Marshell Garfinkel, MD    Physical Exam: Vitals:   04/06/22 1930 04/06/22 2000 04/06/22 2030 04/06/22 2139  BP: 128/60 122/60 119/60 (!) 131/57  Pulse: (!) 103 (!) 106 (!) 107 (!) 110  Resp: '16 14 18 18  '$ Temp:    98.2 F (36.8 C)  TempSrc:    Oral  SpO2: 100% 100% 100% 100%    Physical Exam Constitutional:      General: She is not in acute distress.    Appearance: Normal appearance.  HENT:     Head: Normocephalic and atraumatic.     Mouth/Throat:     Mouth: Mucous membranes are moist.  Pharynx: Oropharynx is clear.  Eyes:     Extraocular Movements: Extraocular movements intact.     Pupils: Pupils are equal, round, and reactive to light.  Cardiovascular:     Rate and Rhythm: Normal rate and regular rhythm.     Pulses: Normal pulses.     Heart sounds: Normal heart sounds.  Pulmonary:     Effort: Pulmonary effort is normal. No respiratory distress.     Breath sounds: Normal breath sounds.  Abdominal:     General: Bowel sounds are normal. There is no distension.     Palpations: Abdomen is soft.     Tenderness: There is no abdominal tenderness.  Musculoskeletal:        General: No swelling or deformity.     Left lower leg: Edema (Trace pedal) present.  Skin:    General: Skin is warm and dry.  Neurological:     General: No focal deficit present.     Mental Status: Mental status is at baseline.    Labs on Admission: I have personally reviewed following labs and imaging studies  CBC: Recent Labs  Lab 04/06/22 1500  WBC 9.2  HGB 6.1*  HCT 23.5*  MCV 70.1*  PLT 580    Basic Metabolic Panel: Recent Labs  Lab 04/06/22 1500  NA 139  K 3.6  CL 105  CO2 27  GLUCOSE 101*  BUN 7*  CREATININE 0.58  CALCIUM 8.8*    GFR: Estimated Creatinine Clearance: 55.3 mL/min (by C-G formula based on SCr of 0.58 mg/dL).  Liver Function Tests: No results for input(s): "AST", "ALT", "ALKPHOS", "BILITOT",  "PROT", "ALBUMIN" in the last 168 hours.  Urine analysis:    Component Value Date/Time   COLORURINE STRAW (A) 08/13/2021 1603   APPEARANCEUR CLEAR 08/13/2021 1603   LABSPEC 1.010 08/13/2021 1603   PHURINE 6.5 08/13/2021 1603   GLUCOSEU NEGATIVE 08/13/2021 1603   HGBUR NEGATIVE 08/13/2021 1603   HGBUR trace-lysed 01/21/2009 0954   BILIRUBINUR NEGATIVE 08/13/2021 1603   KETONESUR NEGATIVE 08/13/2021 1603   PROTEINUR NEGATIVE 08/13/2021 1603   UROBILINOGEN 0.2 01/21/2009 0954   NITRITE NEGATIVE 08/13/2021 1603   LEUKOCYTESUR NEGATIVE 08/13/2021 1603    Radiological Exams on Admission: DG Chest 2 View  Result Date: 04/06/2022 CLINICAL DATA:  Shortness of breath.  Right leg numbness. EXAM: CHEST - 2 VIEW COMPARISON:  Two-view chest x-ray 01/30/2022. FINDINGS: Heart size normal. Atherosclerotic calcifications are present in the aortic arch. Changes of COPD noted. No edema or effusion is present. No focal airspace disease is present. IMPRESSION: 1. COPD. 2. No acute cardiopulmonary disease. Electronically Signed   By: San Morelle M.D.   On: 04/06/2022 15:16   US Venous Img Lower Right (DVT Study)  Result Date: 04/06/2022 CLINICAL DATA:  Right leg pain EXAM: RIGHT LOWER EXTREMITY VENOUS DOPPLER ULTRASOUND TECHNIQUE: Gray-scale sonography with graded compression, as well as color Doppler and duplex ultrasound were performed to evaluate the lower extremity deep venous systems from the level of the common femoral vein and including the common femoral, femoral, profunda femoral, popliteal and calf veins including the posterior tibial, peroneal and gastrocnemius veins when visible. Spectral Doppler was utilized to evaluate flow at rest and with distal augmentation maneuvers in the common femoral, femoral and popliteal veins. COMPARISON:  None Available. FINDINGS: Contralateral Common Femoral Vein: Respiratory phasicity is normal and symmetric with the symptomatic side. No evidence of thrombus.  Normal compressibility. Common Femoral Vein: No evidence of thrombus. Normal compressibility, respiratory phasicity and response to  augmentation. Saphenofemoral Junction: No evidence of thrombus. Normal compressibility and flow on color Doppler imaging. Profunda Femoral Vein: No evidence of thrombus. Normal compressibility and flow on color Doppler imaging. Femoral Vein: No evidence of thrombus. Normal compressibility, respiratory phasicity and response to augmentation. Popliteal Vein: No evidence of thrombus. Normal compressibility, respiratory phasicity and response to augmentation. Calf Veins: No evidence of thrombus. Normal compressibility and flow on color Doppler imaging. IMPRESSION: No evidence of deep venous thrombosis. Electronically Signed   By: Jerilynn Mages.  Shick M.D.   On: 04/06/2022 14:33    EKG: Independently reviewed.  Sinus tachycardia at 109 bpm.  Nonspecific T wave changes.  Similar to previous.  Assessment/Plan Principal Problem:   Symptomatic anemia Active Problems:   Hyperlipidemia   Coronary arteriosclerosis   Chronic respiratory failure (HCC)   COPD, group D, by GOLD 2017 classification ([place])   Acute on chronic respiratory failure with hypoxia (HCC)   Symptomatic anemia Acute on chronic respiratory failure with hypoxia > Patient with known history of COPD and chronic respiratory failure with increasing shortness of breath and requiring 3 L / 2.5 L to maintain saturations. > Had symptoms for the past several days seen by pulmonology a few days ago and started on treatment for possible exacerbation. > In the ED noted to have hemoglobin of 6.1 down from previous of 9, suspect symptoms are due to symptomatic anemia not from COPD exacerbation. > FOBT negative in ED.  Anemia work-up including B12, folate, iron, ferritin are pending.  Reticulocyte count inappropriately normal.  Suspicious for iron deficiency. - Monitor on telemetry - Transfuse 1 unit - Trend hemoglobin - Wean oxygen as  tolerated  Left lower extremity edema > Ongoing for few months. Also reports some numbness in R foot, ?abnormal RLS with ?Fe deficiency. Negative DVT study in the ED. -Continue to monitor  COPD > Advanced COPD on home oxygen and chronic steroids. > Seen recently by pulmonology as above suspicion for COPD exacerbation at that time due to worsening shortness of breath however this appears to be more likely symptomatic anemia as above. - Continue home oxygen, wean down to 2 and half liters as tolerated - Continue home Spiriva - Continue home Breo - As needed albuterol - Continue daily prednisone - Continue daily azithromycin, stop daily Levaquin  CAD Hyperlipidemia - Continue home aspirin, atorvastatin  DVT prophylaxis: SCDs for now Code Status:   Full, would like to try Family Communication:  Updated at bedside  Disposition Plan:   Patient is from:  Home  Anticipated DC to:  Home  Anticipated DC date:  1 to 3 days  Anticipated DC barriers: None  Consults called:  None Admission status:  Observation, telemetry  Severity of Illness: The appropriate patient status for this patient is OBSERVATION. Observation status is judged to be reasonable and necessary in order to provide the required intensity of service to ensure the patient's safety. The patient's presenting symptoms, physical exam findings, and initial radiographic and laboratory data in the context of their medical condition is felt to place them at decreased risk for further clinical deterioration. Furthermore, it is anticipated that the patient will be medically stable for discharge from the hospital within 2 midnights of admission.    Marcelyn Bruins MD [facility]  How to contact the [facility] Attending or Consulting provider [place] or covering provider during after hours [place], for this patient?   Check the care team in [facility] and look for a) attending/consulting [facility] provider listed  and b) the [facility] team listed Log  into [email or web address] and use [place]'s universal password to access. If you do not have the password, please contact the hospital operator. Locate the [facility] provider you are looking for under [facility] and page to a number that you can be directly reached. If you still have difficulty reaching the provider, please page the [facility] (Director on Call) for the Hospitalists listed on amion for assistance.  04/06/2022, 10:42 PM

## 2022-04-06 NOTE — ED Notes (Signed)
CRITICAL LAB RESULTS: Hgb 6.1, ED MD informed

## 2022-04-06 NOTE — ED Triage Notes (Signed)
Patient presents to ED via POV from [facility]. Patient reports right leg numbness x 1 week. No slurred speech. No facial droop. Also reports bilateral leg pain "for months".

## 2022-04-06 NOTE — ED Notes (Signed)
[facility] report given to [place], Therapist, sports. Truck en route now

## 2022-04-06 NOTE — ED Notes (Signed)
Report given to Margaret Henry on [place]

## 2022-04-06 NOTE — ED Notes (Addendum)
Floor called to notify of [facility] transport shortly. Report already given by dayshift RN

## 2022-04-07 ENCOUNTER — Observation Stay (HOSPITAL_COMMUNITY): Payer: Medicare HMO

## 2022-04-07 DIAGNOSIS — Z90722 Acquired absence of ovaries, bilateral: Secondary | ICD-10-CM | POA: Diagnosis not present

## 2022-04-07 DIAGNOSIS — Z85828 Personal history of other malignant neoplasm of skin: Secondary | ICD-10-CM | POA: Diagnosis not present

## 2022-04-07 DIAGNOSIS — E559 Vitamin D deficiency, unspecified: Secondary | ICD-10-CM | POA: Diagnosis present

## 2022-04-07 DIAGNOSIS — E785 Hyperlipidemia, unspecified: Secondary | ICD-10-CM | POA: Diagnosis present

## 2022-04-07 DIAGNOSIS — R609 Edema, unspecified: Secondary | ICD-10-CM

## 2022-04-07 DIAGNOSIS — Z8616 Personal history of COVID-19: Secondary | ICD-10-CM | POA: Diagnosis not present

## 2022-04-07 DIAGNOSIS — M25551 Pain in right hip: Secondary | ICD-10-CM | POA: Diagnosis not present

## 2022-04-07 DIAGNOSIS — D649 Anemia, unspecified: Secondary | ICD-10-CM | POA: Diagnosis present

## 2022-04-07 DIAGNOSIS — Z90710 Acquired absence of both cervix and uterus: Secondary | ICD-10-CM | POA: Diagnosis not present

## 2022-04-07 DIAGNOSIS — Z9981 Dependence on supplemental oxygen: Secondary | ICD-10-CM | POA: Diagnosis not present

## 2022-04-07 DIAGNOSIS — J9611 Chronic respiratory failure with hypoxia: Secondary | ICD-10-CM | POA: Diagnosis present

## 2022-04-07 DIAGNOSIS — J9621 Acute and chronic respiratory failure with hypoxia: Secondary | ICD-10-CM | POA: Diagnosis not present

## 2022-04-07 DIAGNOSIS — Z885 Allergy status to narcotic agent status: Secondary | ICD-10-CM | POA: Diagnosis not present

## 2022-04-07 DIAGNOSIS — M810 Age-related osteoporosis without current pathological fracture: Secondary | ICD-10-CM | POA: Diagnosis present

## 2022-04-07 DIAGNOSIS — D519 Vitamin B12 deficiency anemia, unspecified: Secondary | ICD-10-CM | POA: Diagnosis present

## 2022-04-07 DIAGNOSIS — Z7951 Long term (current) use of inhaled steroids: Secondary | ICD-10-CM | POA: Diagnosis not present

## 2022-04-07 DIAGNOSIS — R600 Localized edema: Secondary | ICD-10-CM | POA: Diagnosis present

## 2022-04-07 DIAGNOSIS — Z888 Allergy status to other drugs, medicaments and biological substances status: Secondary | ICD-10-CM | POA: Diagnosis not present

## 2022-04-07 DIAGNOSIS — S7001XA Contusion of right hip, initial encounter: Secondary | ICD-10-CM | POA: Diagnosis present

## 2022-04-07 DIAGNOSIS — J449 Chronic obstructive pulmonary disease, unspecified: Secondary | ICD-10-CM | POA: Diagnosis not present

## 2022-04-07 DIAGNOSIS — Z7982 Long term (current) use of aspirin: Secondary | ICD-10-CM | POA: Diagnosis not present

## 2022-04-07 DIAGNOSIS — D508 Other iron deficiency anemias: Secondary | ICD-10-CM | POA: Diagnosis not present

## 2022-04-07 DIAGNOSIS — J441 Chronic obstructive pulmonary disease with (acute) exacerbation: Secondary | ICD-10-CM | POA: Diagnosis present

## 2022-04-07 DIAGNOSIS — N3281 Overactive bladder: Secondary | ICD-10-CM | POA: Diagnosis present

## 2022-04-07 DIAGNOSIS — G629 Polyneuropathy, unspecified: Secondary | ICD-10-CM | POA: Diagnosis present

## 2022-04-07 DIAGNOSIS — E782 Mixed hyperlipidemia: Secondary | ICD-10-CM | POA: Diagnosis not present

## 2022-04-07 DIAGNOSIS — K219 Gastro-esophageal reflux disease without esophagitis: Secondary | ICD-10-CM | POA: Diagnosis present

## 2022-04-07 DIAGNOSIS — I2510 Atherosclerotic heart disease of native coronary artery without angina pectoris: Secondary | ICD-10-CM | POA: Diagnosis present

## 2022-04-07 DIAGNOSIS — D509 Iron deficiency anemia, unspecified: Secondary | ICD-10-CM

## 2022-04-07 DIAGNOSIS — Z79899 Other long term (current) drug therapy: Secondary | ICD-10-CM | POA: Diagnosis not present

## 2022-04-07 DIAGNOSIS — E876 Hypokalemia: Secondary | ICD-10-CM | POA: Diagnosis present

## 2022-04-07 HISTORY — DX: Polyneuropathy, unspecified: G62.9

## 2022-04-07 LAB — FERRITIN: Ferritin: 6 ng/mL — ABNORMAL LOW (ref 11–307)

## 2022-04-07 LAB — COMPREHENSIVE METABOLIC PANEL
ALT: 12 U/L (ref 0–44)
AST: 15 U/L (ref 15–41)
Albumin: 3.5 g/dL (ref 3.5–5.0)
Alkaline Phosphatase: 49 U/L (ref 38–126)
Anion gap: 8 (ref 5–15)
BUN: 8 mg/dL (ref 8–23)
CO2: 26 mmol/L (ref 22–32)
Calcium: 8.7 mg/dL — ABNORMAL LOW (ref 8.9–10.3)
Chloride: 109 mmol/L (ref 98–111)
Creatinine, Ser: 0.59 mg/dL (ref 0.44–1.00)
GFR, Estimated: >60 mL/min (ref >60)
Glucose, Bld: 93 mg/dL (ref 70–99)
Potassium: 3.6 mmol/L (ref 3.5–5.1)
Sodium: 143 mmol/L (ref 135–145)
Total Bilirubin: 1.1 mg/dL (ref 0.3–1.2)
Total Protein: 6 g/dL — ABNORMAL LOW (ref 6.5–8.1)

## 2022-04-07 LAB — IRON AND TIBC
Iron: 8 ug/dL — ABNORMAL LOW (ref 28–170)
Saturation Ratios: 2 % — ABNORMAL LOW (ref 10.4–31.8)
TIBC: 409 ug/dL (ref 250–450)
UIBC: 401 ug/dL

## 2022-04-07 LAB — PREPARE RBC (CROSSMATCH)

## 2022-04-07 LAB — RESPIRATORY CULTURE OR RESPIRATORY AND SPUTUM CULTURE
MICRO NUMBER:: 13583346
SPECIMEN QUALITY:: ADEQUATE

## 2022-04-07 LAB — CBC
HCT: 27.6 % — ABNORMAL LOW (ref 36.0–46.0)
Hemoglobin: 7.7 g/dL — ABNORMAL LOW (ref 12.0–15.0)
MCH: 20.6 pg — ABNORMAL LOW (ref 26.0–34.0)
MCHC: 27.9 g/dL — ABNORMAL LOW (ref 30.0–36.0)
MCV: 73.8 fL — ABNORMAL LOW (ref 80.0–100.0)
Platelets: 344 10*3/uL (ref 150–400)
RBC: 3.74 MIL/uL — ABNORMAL LOW (ref 3.87–5.11)
RDW: 19.5 % — ABNORMAL HIGH (ref 11.5–15.5)
WBC: 8.3 10*3/uL (ref 4.0–10.5)
nRBC: 0 % (ref 0.0–0.2)

## 2022-04-07 LAB — VITAMIN B12: Vitamin B-12: 170 pg/mL — ABNORMAL LOW (ref 180–914)

## 2022-04-07 MED ORDER — ALBUTEROL SULFATE (2.5 MG/3ML) 0.083% IN NEBU
2.5000 mg | INHALATION_SOLUTION | RESPIRATORY_TRACT | Status: DC | PRN
Start: 2022-04-07 — End: 2022-04-12
  Filled 2022-04-07: qty 3

## 2022-04-07 MED ORDER — ALBUTEROL SULFATE (2.5 MG/3ML) 0.083% IN NEBU
3.0000 mL | INHALATION_SOLUTION | Freq: Three times a day (TID) | RESPIRATORY_TRACT | Status: DC
Start: 2022-04-07 — End: 2022-04-11
  Administered 2022-04-07 – 2022-04-10 (×11): 3 mL via RESPIRATORY_TRACT
  Filled 2022-04-07 (×11): qty 3

## 2022-04-07 MED ORDER — SODIUM CHLORIDE 0.9 % IV SOLN
250.0000 mg | Freq: Once | INTRAVENOUS | Status: AC
  Administered 2022-04-07: 250 mg via INTRAVENOUS
  Filled 2022-04-07: qty 20

## 2022-04-07 MED ORDER — SODIUM CHLORIDE 0.9% IV SOLUTION: Freq: Once | INTRAVENOUS | Status: AC

## 2022-04-07 MED ORDER — POTASSIUM CHLORIDE 20 MEQ PO PACK
20.0000 meq | PACK | Freq: Once | ORAL | Status: AC
  Administered 2022-04-07: 20 meq via ORAL
  Filled 2022-04-07: qty 1

## 2022-04-07 MED ORDER — FUROSEMIDE 10 MG/ML IJ SOLN
20.0000 mg | Freq: Once | INTRAMUSCULAR | Status: AC
  Administered 2022-04-07: 20 mg via INTRAVENOUS
  Filled 2022-04-07: qty 2

## 2022-04-07 MED ORDER — CYANOCOBALAMIN 1000 MCG/ML IJ SOLN
1000.0000 ug | Freq: Every day | INTRAMUSCULAR | Status: DC
  Administered 2022-04-07 – 2022-04-10 (×4): 1000 ug via INTRAMUSCULAR
  Filled 2022-04-07 (×5): qty 1

## 2022-04-07 NOTE — Progress Notes (Addendum)
PROGRESS NOTE    YUMI INSALACO  UXN:[id number] DOB: 11-17-1950 DOA: 04/06/2022 PCP: [name], MD   Brief Narrative: 71 year old with past medical history significant for hyperlipidemia CAD, GERD, COPD, chronic respiratory failure, overactive bladder presents with shortness of breath and lower extremity pain.  She reports leg pain for several months, right foot numbness.  She also reports several days of increased shortness of breath, her oxygen requirement increased from 2L to 3 L.  On 6/26 she is started treatment for possible COPD exacerbation by her pulmonologist.  She was found to have hemoglobin down to 6.1 from baseline 9.  FOBT negative.  Chest x-ray showed changes of COPD but no other acute abnormalities.  Doppler right lower extremity was negative.    Assessment & Plan:   Principal Problem:   B12 deficiency anemia Active Problems:   Hyperlipidemia   Coronary arteriosclerosis   Chronic respiratory failure (HCC)   COPD, group D, by GOLD 2017 classification ([place])   Acute on chronic respiratory failure with hypoxia (HCC)   Iron deficiency anemia   Neuropathy  1-Symptomatic anemia: Iron deficiency and B 12  -Anemia Panel: Iron 8, ferritin 6, folate 16, B12 170 -1 unit of packed red blood cells ordered. She received one unit PRBC on admission.  -She denies melena, hematochezia, Occult blood negative.  -She will need follow up with GI for further evaluation of iron deficiency anemia.  Repeat Hb in am.  -She received IV iron.  Hb 6---7---  2-Acute on chronic respiratory failure with hypoxia: Acute COPD exacerbation, advanced COPD on chronic steroid at home oxygen Oxygen requirement increased to 3 L Continue with azithromycin, prednisone, Incruse, Breo In setting of anemia.  Continue with inhaler. Schedule Nebulizer.   3-Left lower extremity edema: Doppler left LE negative for DVT  Right foot numbness:  Right Doppler: Right lower extremity Doppler negative for  DVT Suspect neuropathy form B 12 deficiency. Also replete iron.   CAD, hyperlipidemia: Continue with aspirin and atorvastatin    Estimated body mass index is 20.56 kg/m as calculated from the following:   Height as of 04/02/22: '5\' 4"'$  (1.626 m).   Weight as of 04/02/22: 54.3 kg.   DVT prophylaxis: SCD Code Status: Full code Family Communication: Care discussed with patient.  Disposition Plan:  Status is: Observation The patient will require care spanning > 2 midnights and should be moved to inpatient because: admit for symptomatic anemia.     Consultants:  None  Procedures:  Doppler  Antimicrobials:    Subjective: She report some improvement of dyspnea.  She denies melena, hematochezia.  She report left LE edema. Right foot pain numbness.   Objective: Vitals:   04/07/22 1048 04/07/22 1330 04/07/22 1452 04/07/22 1529  BP:  (!) 143/83 138/68 (!) 110/53  Pulse:  (!) 108 (!) 102 (!) 103  Resp:  '18 19 19  '$ Temp:  98 F (36.7 C) 97.8 F (36.6 C) 98.6 F (37 C)  TempSrc:  Oral Oral Axillary  SpO2: 100% 100% 97% 97%    Intake/Output Summary (Last 24 hours) at 04/07/2022 1536 Last data filed at 04/07/2022 1400 Gross per 24 hour  Intake 878 ml  Output --  Net 878 ml   There were no vitals filed for this visit.  Examination:  General exam: Appears calm and comfortable  Respiratory system: Clear to auscultation. No wheezing.  Cardiovascular system: S1 & S2 heard, RRR. No JVD, murmurs, rubs, gallops or clicks. No pedal edema. Gastrointestinal system: Abdomen is nondistended,  soft and nontender. No organomegaly or masses felt. Normal bowel sounds heard. Central nervous system: Alert and oriented.  Extremities: Symmetric 5 x 5 power.   Data Reviewed: I have personally reviewed following labs and imaging studies  CBC: Recent Labs  Lab 04/06/22 1500 04/07/22 0759  WBC 9.2 8.3  HGB 6.1* 7.7*  HCT 23.5* 27.6*  MCV 70.1* 73.8*  PLT 364 956   Basic Metabolic  Panel: Recent Labs  Lab 04/06/22 1500 04/07/22 0759  NA 139 143  K 3.6 3.6  CL 105 109  CO2 27 26  GLUCOSE 101* 93  BUN 7* 8  CREATININE 0.58 0.59  CALCIUM 8.8* 8.7*   GFR: Estimated Creatinine Clearance: 55.3 mL/min (by C-G formula based on SCr of 0.59 mg/dL). Liver Function Tests: Recent Labs  Lab 04/07/22 0759  AST 15  ALT 12  ALKPHOS 49  BILITOT 1.1  PROT 6.0*  ALBUMIN 3.5   No results for input(s): "LIPASE", "AMYLASE" in the last 168 hours. No results for input(s): "AMMONIA" in the last 168 hours. Coagulation Profile: No results for input(s): "INR", "PROTIME" in the last 168 hours. Cardiac Enzymes: No results for input(s): "CKTOTAL", "CKMB", "CKMBINDEX", "TROPONINI" in the last 168 hours. BNP (last 3 results) No results for input(s): "PROBNP" in the last 8760 hours. HbA1C: No results for input(s): "HGBA1C" in the last 72 hours. CBG: No results for input(s): "GLUCAP" in the last 168 hours. Lipid Profile: No results for input(s): "CHOL", "HDL", "LDLCALC", "TRIG", "CHOLHDL", "LDLDIRECT" in the last 72 hours. Thyroid Function Tests: No results for input(s): "TSH", "T4TOTAL", "FREET4", "T3FREE", "THYROIDAB" in the last 72 hours. Anemia Panel: Recent Labs    04/06/22 1646  VITAMINB12 170*  FOLATE 16.1  FERRITIN 6*  TIBC 409  IRON 8*  RETICCTPCT 2.1   Sepsis Labs: No results for input(s): "PROCALCITON", "LATICACIDVEN" in the last 168 hours.  Recent Results (from the past 240 hour(s))  Respiratory or Resp and Sputum Culture     Status: Abnormal   Collection Time: 04/04/22 10:51 AM   Specimen: Sputum  Result Value Ref Range Status   MICRO NUMBER: [id number]  Final   SPECIMEN QUALITY: Adequate  Final   Source SPUTUM  Final   STATUS: FINAL  Final   GRAM STAIN:   Final    Moderate Polymorphonuclear leukocytes Moderate epithelial cells No organisms seen   ISOLATE 2: Yeast Isolated. (A)  Final    Comment: Moderate growth of Yeast Isolated. Please contact the  laboratory within 3 days if further identification is desired.         Radiology Studies: VAS [place] LOWER EXTREMITY VENOUS (DVT)  Result Date: 04/07/2022  Lower Venous DVT Study Patient Name:  Margaret Henry  Date of Exam:   04/07/2022 Medical Rec #: [id number]       Accession #:    [id number] Date of Birth: 1951-02-03       Patient Gender: F Patient Age:   10 years Exam Location:  [facility] Procedure:      VAS [place] LOWER EXTREMITY VENOUS (DVT) Referring Phys: Jerald Kief Bradlee Bridgers --------------------------------------------------------------------------------  Indications: Edema.  Risk Factors: None identified. Comparison Study: No prior studies. Performing Technologist: Oliver Hum RVT  Examination Guidelines: A complete evaluation includes B-mode imaging, spectral Doppler, color Doppler, and power Doppler as needed of all accessible portions of each vessel. Bilateral testing is considered an integral part of a complete examination. Limited examinations for reoccurring indications may be performed as noted. The reflux portion of the  exam is performed with the patient in reverse Trendelenburg.  +-----+---------------+---------+-----------+----------+--------------+ RIGHTCompressibilityPhasicitySpontaneityPropertiesThrombus Aging +-----+---------------+---------+-----------+----------+--------------+ CFV  Full           Yes      Yes                                 +-----+---------------+---------+-----------+----------+--------------+   +---------+---------------+---------+-----------+----------+--------------+ LEFT     CompressibilityPhasicitySpontaneityPropertiesThrombus Aging +---------+---------------+---------+-----------+----------+--------------+ CFV      Full           Yes      Yes                                 +---------+---------------+---------+-----------+----------+--------------+ SFJ      Full                                                         +---------+---------------+---------+-----------+----------+--------------+ FV Prox  Full                                                        +---------+---------------+---------+-----------+----------+--------------+ FV Mid   Full                                                        +---------+---------------+---------+-----------+----------+--------------+ FV DistalFull                                                        +---------+---------------+---------+-----------+----------+--------------+ PFV      Full                                                        +---------+---------------+---------+-----------+----------+--------------+ POP      Full           Yes      Yes                                 +---------+---------------+---------+-----------+----------+--------------+ PTV      Full                                                        +---------+---------------+---------+-----------+----------+--------------+ PERO     Full                                                        +---------+---------------+---------+-----------+----------+--------------+  Summary: RIGHT: - No evidence of common femoral vein obstruction.  LEFT: - There is no evidence of deep vein thrombosis in the lower extremity.  - No cystic structure found in the popliteal fossa.  *See table(s) above for measurements and observations.    Preliminary    DG Chest 2 View  Result Date: 04/06/2022 CLINICAL DATA:  Shortness of breath.  Right leg numbness. EXAM: CHEST - 2 VIEW COMPARISON:  Two-view chest x-ray 01/30/2022. FINDINGS: Heart size normal. Atherosclerotic calcifications are present in the aortic arch. Changes of COPD noted. No edema or effusion is present. No focal airspace disease is present. IMPRESSION: 1. COPD. 2. No acute cardiopulmonary disease. Electronically Signed   By: San Morelle M.D.   On: 04/06/2022 15:16   US Venous Img Lower Right (DVT  Study)  Result Date: 04/06/2022 CLINICAL DATA:  Right leg pain EXAM: RIGHT LOWER EXTREMITY VENOUS DOPPLER ULTRASOUND TECHNIQUE: Gray-scale sonography with graded compression, as well as color Doppler and duplex ultrasound were performed to evaluate the lower extremity deep venous systems from the level of the common femoral vein and including the common femoral, femoral, profunda femoral, popliteal and calf veins including the posterior tibial, peroneal and gastrocnemius veins when visible. Spectral Doppler was utilized to evaluate flow at rest and with distal augmentation maneuvers in the common femoral, femoral and popliteal veins. COMPARISON:  None Available. FINDINGS: Contralateral Common Femoral Vein: Respiratory phasicity is normal and symmetric with the symptomatic side. No evidence of thrombus. Normal compressibility. Common Femoral Vein: No evidence of thrombus. Normal compressibility, respiratory phasicity and response to augmentation. Saphenofemoral Junction: No evidence of thrombus. Normal compressibility and flow on color Doppler imaging. Profunda Femoral Vein: No evidence of thrombus. Normal compressibility and flow on color Doppler imaging. Femoral Vein: No evidence of thrombus. Normal compressibility, respiratory phasicity and response to augmentation. Popliteal Vein: No evidence of thrombus. Normal compressibility, respiratory phasicity and response to augmentation. Calf Veins: No evidence of thrombus. Normal compressibility and flow on color Doppler imaging. IMPRESSION: No evidence of deep venous thrombosis. Electronically Signed   By: Jerilynn Mages.  Shick M.D.   On: 04/06/2022 14:33        Scheduled Meds:  albuterol  3 mL Inhalation TID   aspirin EC  81 mg Oral QHS   atorvastatin  40 mg Oral Daily   azithromycin  250 mg Oral Daily   cyanocobalamin  1,000 mcg Intramuscular Daily   fluticasone furoate-vilanterol  1 puff Inhalation Daily   furosemide  20 mg Intravenous Once   furosemide  20 mg  Oral Daily   predniSONE  10 mg Oral Q breakfast   sodium chloride flush  3 mL Intravenous Q12H   umeclidinium bromide  1 puff Inhalation Daily   Continuous Infusions:   LOS: 0 days    Time spent: 35 minutes.     Elmarie Shiley, MD [facility]   If 7PM-7AM, please contact night-coverage [email or web address]  04/07/2022, 3:36 PM

## 2022-04-07 NOTE — Progress Notes (Signed)
Left lower extremity venous duplex has been completed. Preliminary results can be found in CV Proc through chart review.   04/07/22 11:23 AM Margaret Henry RVT

## 2022-04-08 ENCOUNTER — Inpatient Hospital Stay (HOSPITAL_COMMUNITY): Payer: Medicare HMO

## 2022-04-08 DIAGNOSIS — D519 Vitamin B12 deficiency anemia, unspecified: Secondary | ICD-10-CM | POA: Diagnosis not present

## 2022-04-08 LAB — BASIC METABOLIC PANEL
Anion gap: 6 (ref 5–15)
BUN: 8 mg/dL (ref 8–23)
CO2: 28 mmol/L (ref 22–32)
Calcium: 8.5 mg/dL — ABNORMAL LOW (ref 8.9–10.3)
Chloride: 109 mmol/L (ref 98–111)
Creatinine, Ser: 0.51 mg/dL (ref 0.44–1.00)
GFR, Estimated: >60 mL/min (ref >60)
Glucose, Bld: 85 mg/dL (ref 70–99)
Potassium: 3.1 mmol/L — ABNORMAL LOW (ref 3.5–5.1)
Sodium: 143 mmol/L (ref 135–145)

## 2022-04-08 LAB — CBC
HCT: 30.7 % — ABNORMAL LOW (ref 36.0–46.0)
HCT: 30.2 % — ABNORMAL LOW (ref 36.0–46.0)
Hemoglobin: 9 g/dL — ABNORMAL LOW (ref 12.0–15.0)
Hemoglobin: 8.8 g/dL — ABNORMAL LOW (ref 12.0–15.0)
MCH: 22.5 pg — ABNORMAL LOW (ref 26.0–34.0)
MCH: 22.7 pg — ABNORMAL LOW (ref 26.0–34.0)
MCHC: 29.3 g/dL — ABNORMAL LOW (ref 30.0–36.0)
MCHC: 29.1 g/dL — ABNORMAL LOW (ref 30.0–36.0)
MCV: 76.8 fL — ABNORMAL LOW (ref 80.0–100.0)
MCV: 77.8 fL — ABNORMAL LOW (ref 80.0–100.0)
Platelets: 314 10*3/uL (ref 150–400)
Platelets: 303 10*3/uL (ref 150–400)
RBC: 4 MIL/uL (ref 3.87–5.11)
RBC: 3.88 MIL/uL (ref 3.87–5.11)
RDW: 20.6 % — ABNORMAL HIGH (ref 11.5–15.5)
RDW: 21.1 % — ABNORMAL HIGH (ref 11.5–15.5)
WBC: 8.9 10*3/uL (ref 4.0–10.5)
WBC: 14.9 10*3/uL — ABNORMAL HIGH (ref 4.0–10.5)
nRBC: 0.2 % (ref 0.0–0.2)
nRBC: 0 % (ref 0.0–0.2)

## 2022-04-08 LAB — VITAMIN D 25 HYDROXY (VIT D DEFICIENCY, FRACTURES): Vit D, 25-Hydroxy: 17.72 ng/mL — ABNORMAL LOW (ref 30–100)

## 2022-04-08 MED ORDER — POTASSIUM CHLORIDE CRYS ER 20 MEQ PO TBCR: 40.0000 meq | EXTENDED_RELEASE_TABLET | Freq: Once | ORAL | Status: DC

## 2022-04-08 MED ORDER — POTASSIUM CHLORIDE CRYS ER 20 MEQ PO TBCR
40.0000 meq | EXTENDED_RELEASE_TABLET | Freq: Once | ORAL | Status: AC
  Administered 2022-04-08: 40 meq via ORAL
  Filled 2022-04-08: qty 2

## 2022-04-08 MED ORDER — POTASSIUM CHLORIDE 20 MEQ PO PACK
40.0000 meq | PACK | Freq: Once | ORAL | Status: DC
  Filled 2022-04-08: qty 2

## 2022-04-08 MED ORDER — GUAIFENESIN ER 600 MG PO TB12
600.0000 mg | ORAL_TABLET | Freq: Two times a day (BID) | ORAL | Status: DC
  Filled 2022-04-08 (×5): qty 1

## 2022-04-08 MED ORDER — VITAMIN D (ERGOCALCIFEROL) 1.25 MG (50000 UNIT) PO CAPS
50000.0000 [IU] | ORAL_CAPSULE | ORAL | Status: DC
  Administered 2022-04-08: 50000 [IU] via ORAL
  Filled 2022-04-08: qty 1

## 2022-04-08 NOTE — Progress Notes (Signed)
.   Transition of Care [facility]) Screening Note   Patient Details  Name: Margaret Henry Date of Birth: 10/19/1950   Transition of Care [facility] CM/SW Contact:    Illene Regulus, LCSW Phone Number: 04/08/2022, 9:19 AM    Transition of Care Department [facility]) has reviewed patient and no TOC needs have been identified at this time. We will continue to monitor patient advancement through interdisciplinary progression rounds. If new patient transition needs arise, please place a TOC consult.

## 2022-04-08 NOTE — Progress Notes (Signed)
   04/08/22 2046  Assess: MEWS Score  Temp 99.2 F (37.3 C)  BP (!) 124/54  MAP (mmHg) 76  Pulse Rate (!) 117  Resp 20  SpO2 97 %  O2 Device Nasal Cannula  O2 Flow Rate (L/min) 3 L/min  Assess: MEWS Score  MEWS Temp 0  MEWS Systolic 0  MEWS Pulse 2  MEWS RR 0  MEWS LOC 0  MEWS Score 2  MEWS Score Color Yellow  Assess: if the MEWS score is Yellow or Red  Were vital signs taken at a resting state? Yes  Focused Assessment No change from prior assessment  Does the patient meet 2 or more of the SIRS criteria? No  MEWS guidelines implemented *See Row Information* Yes  Take Vital Signs  Increase Vital Sign Frequency  Yellow: Q 2hr X 2 then Q 4hr X 2, if remains yellow, continue Q 4hrs  Escalate  MEWS: Escalate Yellow: discuss with charge nurse/RN and consider discussing with provider and RRT  Notify: Charge Nurse/RN  Name of Charge Nurse/RN Notified Carmin Richmond, RN  Date Charge Nurse/RN Notified 04/08/22  Time Charge Nurse/RN Notified 2100  Document  Patient Outcome Other (Comment) (stable on floor)  Assess: SIRS CRITERIA  SIRS Temperature  0  SIRS Pulse 1  SIRS Respirations  0  SIRS WBC 0  SIRS Score Sum  1

## 2022-04-08 NOTE — Progress Notes (Addendum)
PROGRESS NOTE    Margaret Henry  RKY:[id number] DOB: May 21, 1951 DOA: 04/06/2022 PCP: [name], MD   Brief Narrative: 71 year old with past medical history significant for hyperlipidemia CAD, GERD, COPD, chronic respiratory failure, overactive bladder presents with shortness of breath and lower extremity pain.  She reports leg pain for several months, right foot numbness.  She also reports several days of increased shortness of breath, her oxygen requirement increased from 2L to 3 L.  On 6/26 she is started treatment for possible COPD exacerbation by her pulmonologist.  She was found to have hemoglobin down to 6.1 from baseline 9.  FOBT negative.  Chest x-ray showed changes of COPD but no other acute abnormalities.  Doppler right lower extremity was negative.    Assessment & Plan:   Principal Problem:   B12 deficiency anemia Active Problems:   Hyperlipidemia   Coronary arteriosclerosis   Chronic respiratory failure (HCC)   COPD, group D, by GOLD 2017 classification ([place])   Acute on chronic respiratory failure with hypoxia (HCC)   Iron deficiency anemia   Neuropathy  1-Symptomatic anemia: Iron deficiency and B 12 deficiency  -Anemia Panel: Iron 8, ferritin 6, folate 16, B12 170 -She has received 2 units PRBC.   -She denies melena, hematochezia, Occult blood negative.  -She will need follow up with GI for further evaluation of iron deficiency anemia.  -She received IV iron.  Hb 6---7---9  2-Acute on chronic respiratory failure with hypoxia: Acute COPD exacerbation, advanced COPD on chronic steroid at home oxygen Oxygen requirement increased to 3 L Continue with azithromycin, prednisone, Incruse, Breo In setting of anemia.  Continue with inhaler. Schedule Nebulizer.  Start guaifenesin.   3-Left lower extremity edema: Doppler left LE negative for DVT  Right foot numbness:  Right Doppler: Right lower extremity Doppler negative for DVT Suspect neuropathy form B 12  deficiency. Also replete iron.  Pain resolved.   CAD, hyperlipidemia: Continue with aspirin and atorvastatin  Right hip hematoma; she doesn't remember how this happen.  Notice today.  Plan to check X ray and monitor hb.  Cold compress.  Monitor hb.  Hold aspirin  Hypokalemia; replete orally.  Vitamin D deficiency; replete orally.    Estimated body mass index is 20.56 kg/m as calculated from the following:   Height as of this encounter: '5\' 4"'$  (1.626 m).   Weight as of 04/02/22: 54.3 kg.   DVT prophylaxis: SCD Code Status: Full code Family Communication: Care discussed with patient.  Disposition Plan:  Status is: Observation The patient will require care spanning > 2 midnights and should be moved to inpatient because: admit for symptomatic anemia.     Consultants:  None  Procedures:  Doppler  Antimicrobials:    Subjective: She report right hip swelling notice today. She doesn't know how it happens.    Objective: Vitals:   04/08/22 0458 04/08/22 0735 04/08/22 1314 04/08/22 1403  BP: (!) 97/45  118/62   Pulse: 93  (!) 108   Resp: 18  18   Temp: 98.6 F (37 C)  97.9 F (36.6 C)   TempSrc: Oral  Oral   SpO2: 99% 98% 99% 97%  Height:        Intake/Output Summary (Last 24 hours) at 04/08/2022 1840 Last data filed at 04/07/2022 2013 Gross per 24 hour  Intake 462.1 ml  Output --  Net 462.1 ml    There were no vitals filed for this visit.  Examination:  General exam: NAD Respiratory system: CTA Cardiovascular  system: S 1, S 2 RRR Gastrointestinal system: BS present, soft,  Extremities: right hip with hematoma swelling.    Data Reviewed: I have personally reviewed following labs and imaging studies  CBC: Recent Labs  Lab 04/06/22 1500 04/07/22 0759 04/08/22 0644  WBC 9.2 8.3 8.9  HGB 6.1* 7.7* 9.0*  HCT 23.5* 27.6* 30.7*  MCV 70.1* 73.8* 76.8*  PLT 364 344 341    Basic Metabolic Panel: Recent Labs  Lab 04/06/22 1500 04/07/22 0759  04/08/22 0644  NA 139 143 143  K 3.6 3.6 3.1*  CL 105 109 109  CO2 '27 26 28  '$ GLUCOSE 101* 93 85  BUN 7* 8 8  CREATININE 0.58 0.59 0.51  CALCIUM 8.8* 8.7* 8.5*    GFR: Estimated Creatinine Clearance: 55.3 mL/min (by C-G formula based on SCr of 0.51 mg/dL). Liver Function Tests: Recent Labs  Lab 04/07/22 0759  AST 15  ALT 12  ALKPHOS 49  BILITOT 1.1  PROT 6.0*  ALBUMIN 3.5    No results for input(s): "LIPASE", "AMYLASE" in the last 168 hours. No results for input(s): "AMMONIA" in the last 168 hours. Coagulation Profile: No results for input(s): "INR", "PROTIME" in the last 168 hours. Cardiac Enzymes: No results for input(s): "CKTOTAL", "CKMB", "CKMBINDEX", "TROPONINI" in the last 168 hours. BNP (last 3 results) No results for input(s): "PROBNP" in the last 8760 hours. HbA1C: No results for input(s): "HGBA1C" in the last 72 hours. CBG: No results for input(s): "GLUCAP" in the last 168 hours. Lipid Profile: No results for input(s): "CHOL", "HDL", "LDLCALC", "TRIG", "CHOLHDL", "LDLDIRECT" in the last 72 hours. Thyroid Function Tests: No results for input(s): "TSH", "T4TOTAL", "FREET4", "T3FREE", "THYROIDAB" in the last 72 hours. Anemia Panel: Recent Labs    04/06/22 1646  VITAMINB12 170*  FOLATE 16.1  FERRITIN 6*  TIBC 409  IRON 8*  RETICCTPCT 2.1    Sepsis Labs: No results for input(s): "PROCALCITON", "LATICACIDVEN" in the last 168 hours.  Recent Results (from the past 240 hour(s))  Respiratory or Resp and Sputum Culture     Status: Abnormal   Collection Time: 04/04/22 10:51 AM   Specimen: Sputum  Result Value Ref Range Status   MICRO NUMBER: [id number]  Final   SPECIMEN QUALITY: Adequate  Final   Source SPUTUM  Final   STATUS: FINAL  Final   GRAM STAIN:   Final    Moderate Polymorphonuclear leukocytes Moderate epithelial cells No organisms seen   ISOLATE 2: Yeast Isolated. (A)  Final    Comment: Moderate growth of Yeast Isolated. Please contact the  laboratory within 3 days if further identification is desired.         Radiology Studies: DG HIP UNILAT WITH PELVIS 2-3 VIEWS RIGHT  Result Date: 04/08/2022 CLINICAL DATA:  RIGHT hip pain, fell recently EXAM: DG HIP (WITH OR WITHOUT PELVIS) 2-3V RIGHT COMPARISON:  None FINDINGS: Osseous demineralization. Hip and SI joint spaces preserved. No acute fracture, dislocation, or bone destruction. Significant soft tissue swelling laterally at RIGHT hip with a rounded soft tissue opacity 6.3 x 4.9 cm question hematoma versus mass. Scattered atherosclerotic calcifications. IMPRESSION: Osseous demineralization without acute bony findings. Lateral soft tissue swelling with 6.3 x 4.9 cm soft tissue opacity lateral to the hip, question soft tissue hematoma versus mass; consider ultrasound evaluation. Electronically Signed   By: Lavonia Dana M.D.   On: 04/08/2022 13:27   VAS [place] LOWER EXTREMITY VENOUS (DVT)  Result Date: 04/08/2022  Lower Venous DVT Study Patient Name:  JENDAYI  Loura Back  Date of Exam:   04/07/2022 Medical Rec #: [id number]       Accession #:    [id number] Date of Birth: 1951/07/26       Patient Gender: F Patient Age:   58 years Exam Location:  [facility] Procedure:      VAS [place] LOWER EXTREMITY VENOUS (DVT) Referring Phys: Jerald Kief Shenay Torti --------------------------------------------------------------------------------  Indications: Edema.  Risk Factors: None identified. Comparison Study: No prior studies. Performing Technologist: Oliver Hum RVT  Examination Guidelines: A complete evaluation includes B-mode imaging, spectral Doppler, color Doppler, and power Doppler as needed of all accessible portions of each vessel. Bilateral testing is considered an integral part of a complete examination. Limited examinations for reoccurring indications may be performed as noted. The reflux portion of the exam is performed with the patient in reverse Trendelenburg.   +-----+---------------+---------+-----------+----------+--------------+ RIGHTCompressibilityPhasicitySpontaneityPropertiesThrombus Aging +-----+---------------+---------+-----------+----------+--------------+ CFV  Full           Yes      Yes                                 +-----+---------------+---------+-----------+----------+--------------+   +---------+---------------+---------+-----------+----------+--------------+ LEFT     CompressibilityPhasicitySpontaneityPropertiesThrombus Aging +---------+---------------+---------+-----------+----------+--------------+ CFV      Full           Yes      Yes                                 +---------+---------------+---------+-----------+----------+--------------+ SFJ      Full                                                        +---------+---------------+---------+-----------+----------+--------------+ FV Prox  Full                                                        +---------+---------------+---------+-----------+----------+--------------+ FV Mid   Full                                                        +---------+---------------+---------+-----------+----------+--------------+ FV DistalFull                                                        +---------+---------------+---------+-----------+----------+--------------+ PFV      Full                                                        +---------+---------------+---------+-----------+----------+--------------+ POP      Full           Yes  Yes                                 +---------+---------------+---------+-----------+----------+--------------+ PTV      Full                                                        +---------+---------------+---------+-----------+----------+--------------+ PERO     Full                                                         +---------+---------------+---------+-----------+----------+--------------+    Summary: RIGHT: - No evidence of common femoral vein obstruction.  LEFT: - There is no evidence of deep vein thrombosis in the lower extremity.  - No cystic structure found in the popliteal fossa.  *See table(s) above for measurements and observations. Electronically signed by Jamelle Haring on 04/08/2022 at 12:16:08 PM.    Final         Scheduled Meds:  albuterol  3 mL Inhalation TID   atorvastatin  40 mg Oral Daily   azithromycin  250 mg Oral Daily   cyanocobalamin  1,000 mcg Intramuscular Daily   fluticasone furoate-vilanterol  1 puff Inhalation Daily   furosemide  20 mg Oral Daily   guaiFENesin  600 mg Oral BID   predniSONE  10 mg Oral Q breakfast   sodium chloride flush  3 mL Intravenous Q12H   umeclidinium bromide  1 puff Inhalation Daily   Continuous Infusions:   LOS: 1 day    Time spent: 35 minutes.     Elmarie Shiley, MD [facility]   If 7PM-7AM, please contact night-coverage [email or web address]  04/08/2022, 6:40 PM

## 2022-04-09 ENCOUNTER — Telehealth: Payer: Self-pay | Admitting: Pulmonary Disease

## 2022-04-09 ENCOUNTER — Other Ambulatory Visit: Payer: Medicaid Other | Admitting: Hospice

## 2022-04-09 DIAGNOSIS — D519 Vitamin B12 deficiency anemia, unspecified: Secondary | ICD-10-CM | POA: Diagnosis not present

## 2022-04-09 LAB — TYPE AND SCREEN
ABO/RH(D): A POS
Antibody Screen: NEGATIVE
Unit division: 0
Unit division: 0

## 2022-04-09 LAB — BASIC METABOLIC PANEL
Anion gap: 7 (ref 5–15)
BUN: 11 mg/dL (ref 8–23)
CO2: 26 mmol/L (ref 22–32)
Calcium: 8.4 mg/dL — ABNORMAL LOW (ref 8.9–10.3)
Chloride: 108 mmol/L (ref 98–111)
Creatinine, Ser: 0.54 mg/dL (ref 0.44–1.00)
GFR, Estimated: >60 mL/min (ref >60)
Glucose, Bld: 92 mg/dL (ref 70–99)
Potassium: 3.2 mmol/L — ABNORMAL LOW (ref 3.5–5.1)
Sodium: 141 mmol/L (ref 135–145)

## 2022-04-09 LAB — BPAM RBC
Blood Product Expiration Date: 202307202359
Blood Product Expiration Date: 202307202359
ISSUE DATE / TIME: 202307010004
ISSUE DATE / TIME: 202307011508
Unit Type and Rh: 6200
Unit Type and Rh: 6200

## 2022-04-09 LAB — CBC
HCT: 27.4 % — ABNORMAL LOW (ref 36.0–46.0)
HCT: 29.7 % — ABNORMAL LOW (ref 36.0–46.0)
HCT: 28.4 % — ABNORMAL LOW (ref 36.0–46.0)
Hemoglobin: 7.8 g/dL — ABNORMAL LOW (ref 12.0–15.0)
Hemoglobin: 8.3 g/dL — ABNORMAL LOW (ref 12.0–15.0)
Hemoglobin: 8 g/dL — ABNORMAL LOW (ref 12.0–15.0)
MCH: 22.6 pg — ABNORMAL LOW (ref 26.0–34.0)
MCH: 22.4 pg — ABNORMAL LOW (ref 26.0–34.0)
MCH: 22.8 pg — ABNORMAL LOW (ref 26.0–34.0)
MCHC: 28.5 g/dL — ABNORMAL LOW (ref 30.0–36.0)
MCHC: 27.9 g/dL — ABNORMAL LOW (ref 30.0–36.0)
MCHC: 28.2 g/dL — ABNORMAL LOW (ref 30.0–36.0)
MCV: 79.4 fL — ABNORMAL LOW (ref 80.0–100.0)
MCV: 80.3 fL (ref 80.0–100.0)
MCV: 80.9 fL (ref 80.0–100.0)
Platelets: 290 10*3/uL (ref 150–400)
Platelets: 288 10*3/uL (ref 150–400)
Platelets: 292 10*3/uL (ref 150–400)
RBC: 3.45 MIL/uL — ABNORMAL LOW (ref 3.87–5.11)
RBC: 3.7 MIL/uL — ABNORMAL LOW (ref 3.87–5.11)
RBC: 3.51 MIL/uL — ABNORMAL LOW (ref 3.87–5.11)
RDW: 21.5 % — ABNORMAL HIGH (ref 11.5–15.5)
RDW: 22.2 % — ABNORMAL HIGH (ref 11.5–15.5)
RDW: 22.6 % — ABNORMAL HIGH (ref 11.5–15.5)
WBC: 10.2 10*3/uL (ref 4.0–10.5)
WBC: 13.4 10*3/uL — ABNORMAL HIGH (ref 4.0–10.5)
WBC: 11.6 10*3/uL — ABNORMAL HIGH (ref 4.0–10.5)
nRBC: 0 % (ref 0.0–0.2)
nRBC: 0 % (ref 0.0–0.2)
nRBC: 0 % (ref 0.0–0.2)

## 2022-04-09 MED ORDER — POTASSIUM CHLORIDE 20 MEQ PO PACK
40.0000 meq | PACK | Freq: Once | ORAL | Status: AC
  Administered 2022-04-09: 40 meq via ORAL
  Filled 2022-04-09: qty 2

## 2022-04-09 MED ORDER — POTASSIUM CHLORIDE CRYS ER 20 MEQ PO TBCR
40.0000 meq | EXTENDED_RELEASE_TABLET | Freq: Once | ORAL | Status: DC
  Filled 2022-04-09: qty 2

## 2022-04-09 NOTE — Progress Notes (Signed)
Orthopedic Tech Progress Note Patient Details:  Margaret Henry 08/21/1951 [id number]  Ortho Devices Type of Ortho Device: Ace wrap Ortho Device/Splint Location: Hip  spica wrap Ortho Device/Splint Interventions: Application   Post Interventions Patient Tolerated: Well Instructions Provided: Care of device  [place] Pink 04/09/2022, 4:58 PM

## 2022-04-09 NOTE — Consult Note (Signed)
Reason for Consult:  right thigh swelling Referring Physician: Dr. Berle Mull Margaret Henry is an 71 y.o. female.  HPI: 71 year old female with a past medical history significant for COPD complains of right proximal thigh swelling since Saturday.  She denies any history of injury.  She woke with a start early Saturday morning feeling somewhat disoriented.  She complains of some numbness in both feet worse on the right than on the left.  Her primary care provider instructed her to go to urgent care and then she was sent to the emergency room for an ultrasound.  She denies any history of falls.  She does not take any blood thinners.  Over the last couple of days she has developed ecchymosis at her buttock and proximal thigh.  She denies any pain when standing or walking.  She reports that the sense of numbness and weakness in her legs has resolved since being in the hospital.  Past Medical History:  Diagnosis Date   Allergy    hayfever   Bronchitis    Cancer ([place])    skin cancer on chest   Chest pain 06/12/2016   Colon polyps    Complication of anesthesia    pt states was given too much during nasal surgery 1989; difficulty getting awake   Complication of anesthesia 05/16/2021   pt states was given too much during nasal surgery 1989; difficulty getting awake   COPD (chronic obstructive pulmonary disease) ([place])    COPD exacerbation ([place]) 06/01/2013   Coronary artery calcification    Diverticulitis    Diverticulitis 05/16/2021   occasional   Dyspnea 06/27/2016   GERD (gastroesophageal reflux disease)    occasional   Hemorrhoids    High cholesterol    Hyperlipidemia LDL goal <70    Mild aortic insufficiency    Osteoporosis    Pneumonia    Shortness of breath dyspnea    with exertion    Thyroid goiter    bx benign   Tobacco abuse    Vertigo    Vertigo 04/21/2017    Past Surgical History:  Procedure Laterality Date   ABDOMINAL HYSTERECTOMY     BILATERAL OOPHORECTOMY  2001   for benign  ovarian mass    BIOPSY THYROID     DENTAL SURGERY     dentures   NASAL SEPTUM SURGERY     PARTIAL HYSTERECTOMY  1976   for heavy menses    RECTAL EXAM UNDER ANESTHESIA N/A 12/14/2015   Procedure: RECTAL EXAM UNDER ANESTHESIA REMOVAL OF ANAL CANAL MASS; INTERNAL HEMORRHOID LIGATION, EXTERNAL HEMORRHOID LIGATION;  Surgeon: Michael Boston, MD;  Location: [facility];  Service: General;  Laterality: N/A;   TUBAL LIGATION     WISDOM TOOTH EXTRACTION      Family History  Problem Relation Age of Onset   Heart disease Mother    COPD Father    Lung cancer Maternal Grandfather    Liver cancer Paternal Grandmother    Colon cancer Neg Hx    Colon polyps Neg Hx    Esophageal cancer Neg Hx    Rectal cancer Neg Hx    Stomach cancer Neg Hx     Social History:  reports that she quit smoking about 3 years ago. Her smoking use included cigarettes. She has a 73.50 pack-year smoking history. She has never used smokeless tobacco. She reports that she does not drink alcohol and does not use drugs.  Allergies:  Allergies  Allergen Reactions   Hydrocodone Nausea And Vomiting  Propoxyphene N-Acetaminophen Nausea And Vomiting    Medications: I have reviewed the patient's current medications.  Results for orders placed or performed during the hospital encounter of 04/06/22 (from the past 48 hour(s))  VITAMIN D 25 Hydroxy (Vit-D Deficiency, Fractures)     Status: Abnormal   Collection Time: 04/08/22  6:44 AM  Result Value Ref Range   Vit D, 25-Hydroxy 17.72 (L) 30 - 100 ng/mL    Comment: (NOTE) Vitamin D deficiency has been defined by the [place] practice guideline as a level of serum 25-OH  vitamin D less than 20 ng/mL (1,2). The Endocrine Society went on to  further define vitamin D insufficiency as a level between 21 and 29  ng/mL (2).  1. IOM (Institute of Medicine). 2010. Dietary reference intakes for  calcium and D. [place]: [employer]. 2. Holick MF, Binkley [place], Bischoff-Ferrari HA, et al. Evaluation,  treatment, and prevention of vitamin D deficiency: an Endocrine  Society clinical practice guideline, JCEM. 2011 Jul; 96(7): 1911-30.  Performed at [facility], [place] [address], [place], [place] [address]   CBC     Status: Abnormal   Collection Time: 04/08/22  6:44 AM  Result Value Ref Range   WBC 8.9 4.0 - 10.5 K/uL   RBC 4.00 3.87 - 5.11 MIL/uL   Hemoglobin 9.0 (L) 12.0 - 15.0 g/dL   HCT 30.7 (L) 36.0 - 46.0 %   MCV 76.8 (L) 80.0 - 100.0 fL   MCH 22.5 (L) 26.0 - 34.0 pg   MCHC 29.3 (L) 30.0 - 36.0 g/dL   RDW 20.6 (H) 11.5 - 15.5 %   Platelets 314 150 - 400 K/uL   nRBC 0.2 0.0 - 0.2 %    Comment: Performed at [facility], [place] [address] [place], [place] [address]  Basic metabolic panel     Status: Abnormal   Collection Time: 04/08/22  6:44 AM  Result Value Ref Range   Sodium 143 135 - 145 mmol/L   Potassium 3.1 (L) 3.5 - 5.1 mmol/L   Chloride 109 98 - 111 mmol/L   CO2 28 22 - 32 mmol/L   Glucose, Bld 85 70 - 99 mg/dL    Comment: Glucose reference range applies only to samples taken after fasting for at least 8 hours.   BUN 8 8 - 23 mg/dL   Creatinine, Ser 0.51 0.44 - 1.00 mg/dL   Calcium 8.5 (L) 8.9 - 10.3 mg/dL   GFR, Estimated >60 >60 mL/min    Comment: (NOTE) Calculated using the CKD-EPI Creatinine Equation (2021)    Anion gap 6 5 - 15    Comment: Performed at [facility], [place] [address], [place], [place] [address]  CBC     Status: Abnormal   Collection Time: 04/08/22  7:01 PM  Result Value Ref Range   WBC 14.9 (H) 4.0 - 10.5 K/uL   RBC 3.88 3.87 - 5.11 MIL/uL   Hemoglobin 8.8 (L) 12.0 - 15.0 g/dL    Comment: Reticulocyte Hemoglobin testing may be clinically indicated, consider ordering this additional test [id number]    HCT 30.2 (L) 36.0 - 46.0 %   MCV 77.8 (L) 80.0 - 100.0 fL   MCH 22.7 (L) 26.0 - 34.0 pg   MCHC 29.1 (L) 30.0 - 36.0 g/dL    RDW 21.1 (H) 11.5 - 15.5 %   Platelets 303 150 - 400 K/uL   nRBC 0.0  0.0 - 0.2 %    Comment: Performed at [facility], [place] [address], [place], [place] [address]  CBC     Status: Abnormal   Collection Time: 04/09/22  5:33 AM  Result Value Ref Range   WBC 10.2 4.0 - 10.5 K/uL   RBC 3.45 (L) 3.87 - 5.11 MIL/uL   Hemoglobin 7.8 (L) 12.0 - 15.0 g/dL   HCT 27.4 (L) 36.0 - 46.0 %   MCV 79.4 (L) 80.0 - 100.0 fL   MCH 22.6 (L) 26.0 - 34.0 pg   MCHC 28.5 (L) 30.0 - 36.0 g/dL   RDW 21.5 (H) 11.5 - 15.5 %   Platelets 290 150 - 400 K/uL   nRBC 0.0 0.0 - 0.2 %    Comment: Performed at [facility], [place] [address], [place], [place] [address]  Basic metabolic panel     Status: Abnormal   Collection Time: 04/09/22  5:33 AM  Result Value Ref Range   Sodium 141 135 - 145 mmol/L   Potassium 3.2 (L) 3.5 - 5.1 mmol/L   Chloride 108 98 - 111 mmol/L   CO2 26 22 - 32 mmol/L   Glucose, Bld 92 70 - 99 mg/dL    Comment: Glucose reference range applies only to samples taken after fasting for at least 8 hours.   BUN 11 8 - 23 mg/dL   Creatinine, Ser 0.54 0.44 - 1.00 mg/dL   Calcium 8.4 (L) 8.9 - 10.3 mg/dL   GFR, Estimated >60 >60 mL/min    Comment: (NOTE) Calculated using the CKD-EPI Creatinine Equation (2021)    Anion gap 7 5 - 15    Comment: Performed at [facility], [place] [address] [place], [place] [address]  CBC     Status: Abnormal   Collection Time: 04/09/22 11:57 AM  Result Value Ref Range   WBC 13.4 (H) 4.0 - 10.5 K/uL   RBC 3.70 (L) 3.87 - 5.11 MIL/uL   Hemoglobin 8.3 (L) 12.0 - 15.0 g/dL   HCT 29.7 (L) 36.0 - 46.0 %   MCV 80.3 80.0 - 100.0 fL   MCH 22.4 (L) 26.0 - 34.0 pg   MCHC 27.9 (L) 30.0 - 36.0 g/dL   RDW 22.2 (H) 11.5 - 15.5 %   Platelets 288 150 - 400 K/uL   nRBC 0.0 0.0 - 0.2 %    Comment: Performed at [facility], [place] [address], [place],  [address]    DG HIP UNILAT WITH PELVIS 2-3 VIEWS  RIGHT  Result Date: 04/08/2022 CLINICAL DATA:  RIGHT hip pain, fell recently EXAM: DG HIP (WITH OR WITHOUT PELVIS) 2-3V RIGHT COMPARISON:  None FINDINGS: Osseous demineralization. Hip and SI joint spaces preserved. No acute fracture, dislocation, or bone destruction. Significant soft tissue swelling laterally at RIGHT hip with a rounded soft tissue opacity 6.3 x 4.9 cm question hematoma versus mass. Scattered atherosclerotic calcifications. IMPRESSION: Osseous demineralization without acute bony findings. Lateral soft tissue swelling with 6.3 x 4.9 cm soft tissue opacity lateral to the hip, question soft tissue hematoma versus mass; consider ultrasound evaluation. Electronically Signed   By: Lavonia Dana M.D.   On: 04/08/2022 13:27    ROS:  no recent f/c/n/v/wt loss.  10 system review o/w negative other than as stated above PE:  Blood pressure (!) 101/55, pulse 95, temperature 98.4 F (36.9 C), temperature source Oral, resp. rate (!) 21, height '5\' 4"'$  (1.626 m), SpO2 97 %. Well-nourished well-developed woman in  no apparent distress.  Alert and oriented x4.  Normal mood and affect.  Extraocular motions are intact.  Respirations are unlabored.  Gait is normal.  She is on oxygen by nasal cannula.  The right hip has localized swelling just posterior and lateral from the greater trochanter.  Slightly tender to palpation in this area.  Ecchymosis over the buttock and medial thigh.  1+ dorsalis pedis pulse in both feet.  Active plantarflexion and dorsiflexion strength at the toes and ankles is 5 out of 5.  No lymphadenopathy.  No gross deformity of the right lower extremity aside from swelling.  Intact sensibility to light touch dorsally and plantarly at the forefoot.  Assessment/Plan: Right hip contusion and hematoma -source of the patient's injury is unclear.  However she seems to have a contusion at the hip with hematoma.  No evidence of fracture or dislocation.  The radiology report describes a subcutaneous  hematoma versus mass.  We would anticipate that hematoma would resolve over the coming few weeks.  If the mass does not decrease in size then an MRI could be ordered as an outpatient.  In the meantime she is safe to bear weight as tolerated.  I discussed a hip spica dressing with Dr. Tyrell Antonio and the Ortho tech on-call to compress the hematoma and decrease any additional localized bleeding.  I confirmed that the spica dressing was indeed correctly applied.  He can be changed daily and as needed.  The patient can follow-up with me in clinic in a few weeks.  I will sign off.  Please call [phone] with any questions.  Wylene Simmer 04/09/2022, 8:37 PM

## 2022-04-09 NOTE — Progress Notes (Signed)
[facility] [address] [facility]) Hospital Liaison note:  This patient is currently enrolled in [facility] outpatient-based Palliative Care. Will continue to follow for disposition.  Please call with any outpatient palliative questions or concerns.  Thank you, Lorelee Market, LPN [facility] Liaison [phone]

## 2022-04-09 NOTE — Telephone Encounter (Signed)
Pt is currently admitted to [facility] and states her doctor at the hospital mentioned the pt had a yeast infection that was producing her green sputum. Pt stated doctor stated she should talk to her lung doctor about that. Pt was informed by me that pt would have to have a pulmonary consult to be treated while admitted. Pt stated understanding.   Routing to Dr. Vaughan Browner as Juluis Rainier

## 2022-04-09 NOTE — Progress Notes (Signed)
PROGRESS NOTE    Margaret Henry  NAT:[id number] DOB: May 28, 1951 DOA: 04/06/2022 PCP: [name], MD   Brief Narrative: 71 year old with past medical history significant for hyperlipidemia CAD, GERD, COPD, chronic respiratory failure, overactive bladder presents with shortness of breath and lower extremity pain.  She reports leg pain for several months, right foot numbness.  She also reports several days of increased shortness of breath, her oxygen requirement increased from 2L to 3 L.  On 6/26 she is started treatment for possible COPD exacerbation by her pulmonologist.  She was found to have hemoglobin down to 6.1 from baseline 9.  FOBT negative.  Chest x-ray showed changes of COPD but no other acute abnormalities.  Doppler right lower extremity was negative.    Assessment & Plan:   Principal Problem:   B12 deficiency anemia Active Problems:   Hyperlipidemia   Coronary arteriosclerosis   Chronic respiratory failure (HCC)   COPD, group D, by GOLD 2017 classification ([place])   Acute on chronic respiratory failure with hypoxia (HCC)   Iron deficiency anemia   Neuropathy  1-Symptomatic anemia: Iron deficiency and B 12 deficiency  -Anemia Panel: Iron 8, ferritin 6, folate 16, B12 170 -She has received 2 units PRBC.   -She denies melena, hematochezia, Occult blood negative.  -She will need follow up with GI for further evaluation of iron deficiency anemia.  -She received IV iron.  Hb 6---7---9---7.8---8.3  2-Acute on chronic respiratory failure with hypoxia: Acute COPD exacerbation, advanced COPD on chronic steroid at home oxygen Oxygen requirement increased to 3 L Continue with azithromycin, prednisone, Incruse, Breo In setting of anemia.  Continue with inhaler. Schedule Nebulizer.  Continue with  guaifenesin.   3-Right Hip Hematoma;  Unclear how it happens , patient doesn't remember, she said happen in the hospital.  Dr Doran Durand consulted, he recommend SPICA dressing. Ortho  tech to placed it.  Monitor hb. Transfuse as needed.  X- ray: Lateral soft tissue swelling with 6.3 x 4.9 cm soft tissue opacity lateral to the hip, question soft tissue hematoma versus mass; consider ultrasound evaluation. Aspirin discontinue.,   Left lower extremity edema: Doppler left LE negative for DVT  Right foot numbness:  Right Doppler: Right lower extremity Doppler negative for DVT Suspect neuropathy form B 12 deficiency. Also replete iron.  Pain resolved.   CAD, hyperlipidemia: Continue with aspirin and atorvastatin    Hypokalemia; replete orally.  Vitamin D deficiency; replacement ordered.    Estimated body mass index is 20.56 kg/m as calculated from the following:   Height as of this encounter: '5\' 4"'$  (1.626 m).   Weight as of 04/02/22: 54.3 kg.   DVT prophylaxis: SCD Code Status: Full code Family Communication: Care discussed with patient.  Disposition Plan:  Status is: Observation The patient will require care spanning > 2 midnights and should be moved to inpatient because: admit for symptomatic anemia.     Consultants:  None  Procedures:  Doppler  Antimicrobials:    Subjective: She denies worsening hip pain. She is breathing better.   Objective: Vitals:   04/09/22 0520 04/09/22 0755 04/09/22 0950 04/09/22 1353  BP: (!) 98/52  (!) 101/53   Pulse: 96  (!) 105   Resp: 14  20   Temp: 98.2 F (36.8 C)  98.6 F (37 C)   TempSrc: Oral  Oral   SpO2: 100% 99% 99% 97%  Height:        Intake/Output Summary (Last 24 hours) at 04/09/2022 1526 Last data filed at  04/09/2022 0950 Gross per 24 hour  Intake 240 ml  Output --  Net 240 ml    There were no vitals filed for this visit.  Examination:  General exam: NAD Respiratory system: CTA Cardiovascular system: S 1, S2 RRR Gastrointestinal system: BS present, soft, nt Extremities: right hip with hematoma swelling.    Data Reviewed: I have personally reviewed following labs and imaging  studies  CBC: Recent Labs  Lab 04/07/22 0759 04/08/22 0644 04/08/22 1901 04/09/22 0533 04/09/22 1157  WBC 8.3 8.9 14.9* 10.2 13.4*  HGB 7.7* 9.0* 8.8* 7.8* 8.3*  HCT 27.6* 30.7* 30.2* 27.4* 29.7*  MCV 73.8* 76.8* 77.8* 79.4* 80.3  PLT 344 314 303 290 191    Basic Metabolic Panel: Recent Labs  Lab 04/06/22 1500 04/07/22 0759 04/08/22 0644 04/09/22 0533  NA 139 143 143 141  K 3.6 3.6 3.1* 3.2*  CL 105 109 109 108  CO2 '27 26 28 26  '$ GLUCOSE 101* 93 85 92  BUN 7* '8 8 11  '$ CREATININE 0.58 0.59 0.51 0.54  CALCIUM 8.8* 8.7* 8.5* 8.4*    GFR: Estimated Creatinine Clearance: 55.3 mL/min (by C-G formula based on SCr of 0.54 mg/dL). Liver Function Tests: Recent Labs  Lab 04/07/22 0759  AST 15  ALT 12  ALKPHOS 49  BILITOT 1.1  PROT 6.0*  ALBUMIN 3.5    No results for input(s): "LIPASE", "AMYLASE" in the last 168 hours. No results for input(s): "AMMONIA" in the last 168 hours. Coagulation Profile: No results for input(s): "INR", "PROTIME" in the last 168 hours. Cardiac Enzymes: No results for input(s): "CKTOTAL", "CKMB", "CKMBINDEX", "TROPONINI" in the last 168 hours. BNP (last 3 results) No results for input(s): "PROBNP" in the last 8760 hours. HbA1C: No results for input(s): "HGBA1C" in the last 72 hours. CBG: No results for input(s): "GLUCAP" in the last 168 hours. Lipid Profile: No results for input(s): "CHOL", "HDL", "LDLCALC", "TRIG", "CHOLHDL", "LDLDIRECT" in the last 72 hours. Thyroid Function Tests: No results for input(s): "TSH", "T4TOTAL", "FREET4", "T3FREE", "THYROIDAB" in the last 72 hours. Anemia Panel: Recent Labs    04/06/22 1646  VITAMINB12 170*  FOLATE 16.1  FERRITIN 6*  TIBC 409  IRON 8*  RETICCTPCT 2.1    Sepsis Labs: No results for input(s): "PROCALCITON", "LATICACIDVEN" in the last 168 hours.  Recent Results (from the past 240 hour(s))  Respiratory or Resp and Sputum Culture     Status: Abnormal   Collection Time: 04/04/22 10:51  AM   Specimen: Sputum  Result Value Ref Range Status   MICRO NUMBER: [id number]  Final   SPECIMEN QUALITY: Adequate  Final   Source SPUTUM  Final   STATUS: FINAL  Final   GRAM STAIN:   Final    Moderate Polymorphonuclear leukocytes Moderate epithelial cells No organisms seen   ISOLATE 2: Yeast Isolated. (A)  Final    Comment: Moderate growth of Yeast Isolated. Please contact the laboratory within 3 days if further identification is desired.         Radiology Studies: DG HIP UNILAT WITH PELVIS 2-3 VIEWS RIGHT  Result Date: 04/08/2022 CLINICAL DATA:  RIGHT hip pain, fell recently EXAM: DG HIP (WITH OR WITHOUT PELVIS) 2-3V RIGHT COMPARISON:  None FINDINGS: Osseous demineralization. Hip and SI joint spaces preserved. No acute fracture, dislocation, or bone destruction. Significant soft tissue swelling laterally at RIGHT hip with a rounded soft tissue opacity 6.3 x 4.9 cm question hematoma versus mass. Scattered atherosclerotic calcifications. IMPRESSION: Osseous demineralization without acute bony  findings. Lateral soft tissue swelling with 6.3 x 4.9 cm soft tissue opacity lateral to the hip, question soft tissue hematoma versus mass; consider ultrasound evaluation. Electronically Signed   By: Lavonia Dana M.D.   On: 04/08/2022 13:27        Scheduled Meds:  albuterol  3 mL Inhalation TID   atorvastatin  40 mg Oral Daily   azithromycin  250 mg Oral Daily   cyanocobalamin  1,000 mcg Intramuscular Daily   fluticasone furoate-vilanterol  1 puff Inhalation Daily   furosemide  20 mg Oral Daily   guaiFENesin  600 mg Oral BID   predniSONE  10 mg Oral Q breakfast   sodium chloride flush  3 mL Intravenous Q12H   umeclidinium bromide  1 puff Inhalation Daily   Vitamin D (Ergocalciferol)  50,000 Units Oral Q7 days   Continuous Infusions:   LOS: 2 days    Time spent: 35 minutes.     Elmarie Shiley, MD [facility]   If 7PM-7AM, please contact  night-coverage [email or web address]  04/09/2022, 3:26 PM

## 2022-04-10 DIAGNOSIS — D519 Vitamin B12 deficiency anemia, unspecified: Secondary | ICD-10-CM | POA: Diagnosis not present

## 2022-04-10 LAB — CBC
HCT: 27.1 % — ABNORMAL LOW (ref 36.0–46.0)
HCT: 31.2 % — ABNORMAL LOW (ref 36.0–46.0)
HCT: 30 % — ABNORMAL LOW (ref 36.0–46.0)
Hemoglobin: 7.5 g/dL — ABNORMAL LOW (ref 12.0–15.0)
Hemoglobin: 8.7 g/dL — ABNORMAL LOW (ref 12.0–15.0)
Hemoglobin: 8.4 g/dL — ABNORMAL LOW (ref 12.0–15.0)
MCH: 22.7 pg — ABNORMAL LOW (ref 26.0–34.0)
MCH: 22.9 pg — ABNORMAL LOW (ref 26.0–34.0)
MCH: 22.8 pg — ABNORMAL LOW (ref 26.0–34.0)
MCHC: 27.7 g/dL — ABNORMAL LOW (ref 30.0–36.0)
MCHC: 27.9 g/dL — ABNORMAL LOW (ref 30.0–36.0)
MCHC: 28 g/dL — ABNORMAL LOW (ref 30.0–36.0)
MCV: 81.9 fL (ref 80.0–100.0)
MCV: 82.1 fL (ref 80.0–100.0)
MCV: 81.3 fL (ref 80.0–100.0)
Platelets: 274 10*3/uL (ref 150–400)
Platelets: 298 10*3/uL (ref 150–400)
Platelets: 306 10*3/uL (ref 150–400)
RBC: 3.31 MIL/uL — ABNORMAL LOW (ref 3.87–5.11)
RBC: 3.8 MIL/uL — ABNORMAL LOW (ref 3.87–5.11)
RBC: 3.69 MIL/uL — ABNORMAL LOW (ref 3.87–5.11)
RDW: 23 % — ABNORMAL HIGH (ref 11.5–15.5)
RDW: 23.4 % — ABNORMAL HIGH (ref 11.5–15.5)
RDW: 23.9 % — ABNORMAL HIGH (ref 11.5–15.5)
WBC: 9.7 10*3/uL (ref 4.0–10.5)
WBC: 11.3 10*3/uL — ABNORMAL HIGH (ref 4.0–10.5)
WBC: 10.3 10*3/uL (ref 4.0–10.5)
nRBC: 0 % (ref 0.0–0.2)
nRBC: 0 % (ref 0.0–0.2)
nRBC: 0 % (ref 0.0–0.2)

## 2022-04-10 LAB — BASIC METABOLIC PANEL
Anion gap: 7 (ref 5–15)
BUN: 11 mg/dL (ref 8–23)
CO2: 25 mmol/L (ref 22–32)
Calcium: 8.8 mg/dL — ABNORMAL LOW (ref 8.9–10.3)
Chloride: 111 mmol/L (ref 98–111)
Creatinine, Ser: 0.52 mg/dL (ref 0.44–1.00)
GFR, Estimated: >60 mL/min (ref >60)
Glucose, Bld: 85 mg/dL (ref 70–99)
Potassium: 3.7 mmol/L (ref 3.5–5.1)
Sodium: 143 mmol/L (ref 135–145)

## 2022-04-10 NOTE — Progress Notes (Addendum)
PROGRESS NOTE    Margaret Henry  FOY:[id number] DOB: 08/02/1951 DOA: 04/06/2022 PCP: [name], MD   Brief Narrative: 71 year old with past medical history significant for hyperlipidemia CAD, GERD, COPD, chronic respiratory failure, overactive bladder presents with shortness of breath and lower extremity pain.  She reports leg pain for several months, right foot numbness.  She also reports several days of increased shortness of breath, her oxygen requirement increased from 2L to 3 L.  On 6/26 she is started treatment for possible COPD exacerbation by her pulmonologist.  She was found to have hemoglobin down to 6.1 from baseline 9.  FOBT negative.  Chest x-ray showed changes of COPD but no other acute abnormalities.  Doppler right lower extremity was negative.    Assessment & Plan:   Principal Problem:   B12 deficiency anemia Active Problems:   Hyperlipidemia   Coronary arteriosclerosis   Chronic respiratory failure (HCC)   COPD, group D, by GOLD 2017 classification ([place])   Acute on chronic respiratory failure with hypoxia (HCC)   Iron deficiency anemia   Neuropathy  1-Symptomatic anemia: Iron deficiency and B 12 deficiency  -Anemia Panel: Iron 8, ferritin 6, folate 16, B12 170 -She has received 2 units PRBC.   -She denies melena, hematochezia, Occult blood negative.  -She will need follow up with GI for further evaluation of iron deficiency anemia.  -She received IV iron.  Hb 6---7---9---7.8---8.3--7.5--8.7 If hemoglobin  remain stable tomorrow, patient might be able to be discharge.   2-Chronic respiratory failure with hypoxia: Acute COPD exacerbation, advanced COPD on chronic steroid at home oxygen Oxygen requirement increased to 3 L Continue with azithromycin, prednisone, Incruse, Breo In setting of anemia.  Continue with inhaler. Schedule Nebulizer.  Continue with  guaifenesin.  Sputum ordered by Dr Vaughan Browner on 6/28: growing Yeast. I discussed with Dr.  Verlee Monte, he  will discussed with Dr Vaughan Browner next step and will let us know.   3-Right Hip Hematoma;  Unclear how it happens , patient doesn't remember, she said happen in the hospital.  Dr Doran Durand consulted, he recommend SPICA dressing. Ortho tech to placed it.  Monitor hb. Transfuse as needed.  X- ray: Lateral soft tissue swelling with 6.3 x 4.9 cm soft tissue opacity lateral to the hip, question soft tissue hematoma versus mass; consider ultrasound evaluation. Aspirin discontinue. Hb this am 7.5----now 8.7 plan to monitor overnight hb level.   Left lower extremity edema: Doppler left LE negative for DVT  Right foot numbness:  Right Doppler: Right lower extremity Doppler negative for DVT Suspect neuropathy form B 12 deficiency. Also replete iron.  Pain resolved.   CAD, hyperlipidemia: Continue with aspirin and atorvastatin  Hypokalemia; Replaced.  Vitamin D deficiency; replacement ordered.    Estimated body mass index is 20.56 kg/m as calculated from the following:   Height as of this encounter: '5\' 4"'$  (1.626 m).   Weight as of 04/02/22: 54.3 kg.   DVT prophylaxis: SCD Code Status: Full code Family Communication: Care discussed with patient.  Disposition Plan:  Status is: Observation The patient will require care spanning > 2 midnights and should be moved to inpatient because: admit for symptomatic anemia.     Consultants:  None  Procedures:  Doppler  Antimicrobials:    Subjective: She is breathing ok, denies worsening dyspnea. She continue to have productive cough, yellowish phlegm. Right hip pain is better.   Objective: Vitals:   04/09/22 2016 04/09/22 2045 04/10/22 0418 04/10/22 0821  BP:  (!) 119/49 Marland Kitchen)  99/43   Pulse:  97 87   Resp:  18 18   Temp:  97.7 F (36.5 C) 98 F (36.7 C)   TempSrc:  Oral Oral   SpO2: 97% 99% 98% 99%  Height:        Intake/Output Summary (Last 24 hours) at 04/10/2022 1244 Last data filed at 04/10/2022 0959 Gross per 24 hour  Intake 363 ml   Output --  Net 363 ml    There were no vitals filed for this visit.  Examination:  General exam: NAD Respiratory system: CTA Cardiovascular system: S 1, S 2 RRR Gastrointestinal system: BS present, soft,nt Extremities: Right hip with large hematoma   Data Reviewed: I have personally reviewed following labs and imaging studies  CBC: Recent Labs  Lab 04/09/22 0533 04/09/22 1157 04/09/22 2148 04/10/22 0535 04/10/22 1136  WBC 10.2 13.4* 11.6* 9.7 11.3*  HGB 7.8* 8.3* 8.0* 7.5* 8.7*  HCT 27.4* 29.7* 28.4* 27.1* 31.2*  MCV 79.4* 80.3 80.9 81.9 82.1  PLT 290 288 292 274 176    Basic Metabolic Panel: Recent Labs  Lab 04/06/22 1500 04/07/22 0759 04/08/22 0644 04/09/22 0533 04/10/22 0535  NA 139 143 143 141 143  K 3.6 3.6 3.1* 3.2* 3.7  CL 105 109 109 108 111  CO2 '27 26 28 26 25  '$ GLUCOSE 101* 93 85 92 85  BUN 7* '8 8 11 11  '$ CREATININE 0.58 0.59 0.51 0.54 0.52  CALCIUM 8.8* 8.7* 8.5* 8.4* 8.8*    GFR: Estimated Creatinine Clearance: 55.3 mL/min (by C-G formula based on SCr of 0.52 mg/dL). Liver Function Tests: Recent Labs  Lab 04/07/22 0759  AST 15  ALT 12  ALKPHOS 49  BILITOT 1.1  PROT 6.0*  ALBUMIN 3.5    No results for input(s): "LIPASE", "AMYLASE" in the last 168 hours. No results for input(s): "AMMONIA" in the last 168 hours. Coagulation Profile: No results for input(s): "INR", "PROTIME" in the last 168 hours. Cardiac Enzymes: No results for input(s): "CKTOTAL", "CKMB", "CKMBINDEX", "TROPONINI" in the last 168 hours. BNP (last 3 results) No results for input(s): "PROBNP" in the last 8760 hours. HbA1C: No results for input(s): "HGBA1C" in the last 72 hours. CBG: No results for input(s): "GLUCAP" in the last 168 hours. Lipid Profile: No results for input(s): "CHOL", "HDL", "LDLCALC", "TRIG", "CHOLHDL", "LDLDIRECT" in the last 72 hours. Thyroid Function Tests: No results for input(s): "TSH", "T4TOTAL", "FREET4", "T3FREE", "THYROIDAB" in the last  72 hours. Anemia Panel: No results for input(s): "VITAMINB12", "FOLATE", "FERRITIN", "TIBC", "IRON", "RETICCTPCT" in the last 72 hours.  Sepsis Labs: No results for input(s): "PROCALCITON", "LATICACIDVEN" in the last 168 hours.  Recent Results (from the past 240 hour(s))  Respiratory or Resp and Sputum Culture     Status: Abnormal   Collection Time: 04/04/22 10:51 AM   Specimen: Sputum  Result Value Ref Range Status   MICRO NUMBER: [id number]  Final   SPECIMEN QUALITY: Adequate  Final   Source SPUTUM  Final   STATUS: FINAL  Final   GRAM STAIN:   Final    Moderate Polymorphonuclear leukocytes Moderate epithelial cells No organisms seen   ISOLATE 2: Yeast Isolated. (A)  Final    Comment: Moderate growth of Yeast Isolated. Please contact the laboratory within 3 days if further identification is desired.         Radiology Studies: DG HIP UNILAT WITH PELVIS 2-3 VIEWS RIGHT  Result Date: 04/08/2022 CLINICAL DATA:  RIGHT hip pain, fell recently EXAM: DG HIP (  WITH OR WITHOUT PELVIS) 2-3V RIGHT COMPARISON:  None FINDINGS: Osseous demineralization. Hip and SI joint spaces preserved. No acute fracture, dislocation, or bone destruction. Significant soft tissue swelling laterally at RIGHT hip with a rounded soft tissue opacity 6.3 x 4.9 cm question hematoma versus mass. Scattered atherosclerotic calcifications. IMPRESSION: Osseous demineralization without acute bony findings. Lateral soft tissue swelling with 6.3 x 4.9 cm soft tissue opacity lateral to the hip, question soft tissue hematoma versus mass; consider ultrasound evaluation. Electronically Signed   By: Lavonia Dana M.D.   On: 04/08/2022 13:27        Scheduled Meds:  albuterol  3 mL Inhalation TID   atorvastatin  40 mg Oral Daily   azithromycin  250 mg Oral Daily   cyanocobalamin  1,000 mcg Intramuscular Daily   fluticasone furoate-vilanterol  1 puff Inhalation Daily   furosemide  20 mg Oral Daily   guaiFENesin  600 mg Oral BID    predniSONE  10 mg Oral Q breakfast   sodium chloride flush  3 mL Intravenous Q12H   umeclidinium bromide  1 puff Inhalation Daily   Vitamin D (Ergocalciferol)  50,000 Units Oral Q7 days   Continuous Infusions:   LOS: 3 days    Time spent: 35 minutes.     Elmarie Shiley, MD [facility]   If 7PM-7AM, please contact night-coverage [email or web address]  04/10/2022, 12:44 PM

## 2022-04-10 NOTE — Progress Notes (Signed)
Orthopedic Tech Progress Note Patient Details:  Margaret Henry 1950/11/10 [id number]  Ortho Devices Type of Ortho Device: Ace wrap Ortho Device/Splint Location: Hip Spica Wrap Ortho Device/Splint Interventions: Ordered, Application, Adjustment   Post Interventions Patient Tolerated: Well Instructions Provided: Care of device, Adjustment of device New wrap applied. [place] A Shevelle Smither 04/10/2022, 1:32 PM

## 2022-04-10 NOTE — Evaluation (Signed)
Physical Therapy Evaluation Patient Details Name: SHELITHA MAGLEY MRN: [id number] DOB: [birth year] Today's Date: 04/10/2022  History of Present Illness   Patient is 71 y/o female who presents to ED from home with Rt proximal thigh swelling, reporting no injury, and numbness in Bil feet. PMH significant for hyperlipidemia, bronchitis, Cancer, colon polyps, COPD, diverticulitis, osterporosis, dyspnea, SOB, goiter, vertigo.   Clinical Impression  Pt presents with functional deficits due to above HPI. PTA pt lives in mobile home, sister helps with ADL's and son assists with mobility. Pt uses O2 tank. Currently pt requires Supervision for transfers and Min assist for gait. Pt is limited by cardiopulmonary restrictions causing her SOB during mobility activities and LE muscle fatigue. Pt will benefit from skilled therapy to address above impairments and increase strength. Recommending HHPT, will progress as able.       Recommendations for follow up therapy are one component of a multi-disciplinary discharge planning process, led by the attending physician.  Recommendations may be updated based on patient status, additional functional criteria and insurance authorization.  Follow Up Recommendations Home health PT      Assistance Recommended at Discharge Intermittent Supervision/Assistance  Patient can return home with the following  A little help with walking and/or transfers;A little help with bathing/dressing/bathroom;Assistance with cooking/housework;Assist for transportation;Help with stairs or ramp for entrance    Equipment Recommendations None recommended by PT  Recommendations for Other Services       Functional Status Assessment Patient has had a recent decline in their functional status and demonstrates the ability to make significant improvements in function in a reasonable and predictable amount of time.     Precautions / Restrictions Precautions Precautions: Fall Restrictions Weight  Bearing Restrictions: No      Mobility  Bed Mobility               General bed mobility comments: Pt standing at sink upon arrival, washing teeth.    Transfers Overall transfer level: Needs assistance Equipment used: Rolling walker (2 wheels) Transfers: Sit to/from Stand Sit to Stand: Supervision           General transfer comment: Pt able to stand, needing cues for safety only.    Ambulation/Gait Ambulation/Gait assistance: Min assist Gait Distance (Feet): 150 Feet Assistive device: Rolling walker (2 wheels) Gait Pattern/deviations: Step-through pattern, Decreased stride length, Trunk flexed Gait velocity: decr     General Gait Details: Pt became SOB throughout gait, needing to take breaks in standing. HR increased to 118 bpm during one break, 129 bpm for second break. Ambulated with RW and O2 tank at 3 L, SP02=95.  Stairs            Wheelchair Mobility    Modified Rankin (Stroke Patients Only)       Balance Overall balance assessment: Needs assistance Sitting-balance support: No upper extremity supported Sitting balance-Leahy Scale: Good     Standing balance support: Bilateral upper extremity supported, During functional activity, Reliant on assistive device for balance Standing balance-Leahy Scale: Fair Standing balance comment: Pt relies on Bil UE external support from RW, demonstrates trunk flexion in standing.                             Pertinent Vitals/Pain      Home Living Family/patient expects to be discharged to:: Private residence Living Arrangements: Alone Available Help at Discharge: Family (Son) Type of Home: Mobile home (Mobile home) Home Access: Stairs to enter  Entrance Stairs-Number of Steps: 3   Home Layout: One level Home Equipment: Shower seat Additional Comments: O2 tank    Prior Function Prior Level of Function : Needs assist       Physical Assist : ADLs (physical)   ADLs (physical):  Bathing;Dressing   ADLs Comments: Pt reports sister helps bathe her and asissts with dressing.     Hand Dominance   Dominant Hand: Right    Extremity/Trunk Assessment   Upper Extremity Assessment Upper Extremity Assessment: Defer to OT evaluation    Lower Extremity Assessment Lower Extremity Assessment: Generalized weakness    Cervical / Trunk Assessment Cervical / Trunk Assessment: Kyphotic  Communication   Communication: No difficulties  Cognition Arousal/Alertness: Awake/alert Behavior During Therapy: WFL for tasks assessed/performed Overall Cognitive Status: Within Functional Limits for tasks assessed                                 General Comments: Pt alert and anticipatory towards her needs moving around room.        General Comments      Exercises     Assessment/Plan    PT Assessment Patient needs continued PT services  PT Problem List Decreased activity tolerance;Decreased balance;Decreased mobility;Cardiopulmonary status limiting activity       PT Treatment Interventions Functional mobility training;Therapeutic activities;Therapeutic exercise;Balance training;Patient/family education    PT Goals (Current goals can be found in the Care Plan section)  Acute Rehab PT Goals Patient Stated Goal: To go home PT Goal Formulation: With patient Time For Goal Achievement: 04/24/22 Potential to Achieve Goals: Good    Frequency Min 2X/week     Co-evaluation               AM-PAC PT "6 Clicks" Mobility  Outcome Measure Help needed turning from your back to your side while in a flat bed without using bedrails?: None Help needed moving from lying on your back to sitting on the side of a flat bed without using bedrails?: None Help needed moving to and from a bed to a chair (including a wheelchair)?: A Little Help needed standing up from a chair using your arms (e.g., wheelchair or bedside chair)?: A Little Help needed to walk in hospital  room?: A Little Help needed climbing 3-5 steps with a railing? : A Little 6 Click Score: 20    End of Session Equipment Utilized During Treatment: Gait belt;Oxygen Activity Tolerance: Patient tolerated treatment well Patient left: in chair;with call bell/phone within reach Nurse Communication: Mobility status PT Visit Diagnosis: Unsteadiness on feet (R26.81);Other abnormalities of gait and mobility (R26.89)    Time: 8315-1761 PT Time Calculation (min) (ACUTE ONLY): 28 min   Charges:   PT Evaluation $PT Eval Low Complexity: 1 Low PT Treatments $Gait Training: 8-22 mins        Margie Ege, SPT Acute [place] 04/10/2022, 10:48 AM

## 2022-04-11 DIAGNOSIS — J9621 Acute and chronic respiratory failure with hypoxia: Secondary | ICD-10-CM

## 2022-04-11 DIAGNOSIS — D508 Other iron deficiency anemias: Secondary | ICD-10-CM

## 2022-04-11 DIAGNOSIS — J449 Chronic obstructive pulmonary disease, unspecified: Secondary | ICD-10-CM | POA: Diagnosis not present

## 2022-04-11 DIAGNOSIS — J9611 Chronic respiratory failure with hypoxia: Secondary | ICD-10-CM | POA: Diagnosis not present

## 2022-04-11 DIAGNOSIS — E782 Mixed hyperlipidemia: Secondary | ICD-10-CM

## 2022-04-11 DIAGNOSIS — G629 Polyneuropathy, unspecified: Secondary | ICD-10-CM

## 2022-04-11 DIAGNOSIS — D519 Vitamin B12 deficiency anemia, unspecified: Secondary | ICD-10-CM | POA: Diagnosis not present

## 2022-04-11 DIAGNOSIS — I2510 Atherosclerotic heart disease of native coronary artery without angina pectoris: Secondary | ICD-10-CM

## 2022-04-11 LAB — CBC
HCT: 27.1 % — ABNORMAL LOW (ref 36.0–46.0)
Hemoglobin: 7.6 g/dL — ABNORMAL LOW (ref 12.0–15.0)
MCH: 23.2 pg — ABNORMAL LOW (ref 26.0–34.0)
MCHC: 28 g/dL — ABNORMAL LOW (ref 30.0–36.0)
MCV: 82.6 fL (ref 80.0–100.0)
Platelets: 274 10*3/uL (ref 150–400)
RBC: 3.28 MIL/uL — ABNORMAL LOW (ref 3.87–5.11)
RDW: 23.9 % — ABNORMAL HIGH (ref 11.5–15.5)
WBC: 8.5 10*3/uL (ref 4.0–10.5)
nRBC: 0 % (ref 0.0–0.2)

## 2022-04-11 LAB — PREPARE RBC (CROSSMATCH)

## 2022-04-11 MED ORDER — VITAMIN B-12 100 MCG PO TABS: 100.0000 ug | ORAL_TABLET | Freq: Every day | ORAL | Status: DC

## 2022-04-11 MED ORDER — VITAMIN B-12 1000 MCG PO TABS
1000.0000 ug | ORAL_TABLET | Freq: Every day | ORAL | Status: DC
Start: 2022-04-11 — End: 2022-04-12
  Administered 2022-04-11 – 2022-04-12 (×2): 1000 ug via ORAL
  Filled 2022-04-11 (×2): qty 1

## 2022-04-11 MED ORDER — IPRATROPIUM-ALBUTEROL 0.5-2.5 (3) MG/3ML IN SOLN
3.0000 mL | Freq: Three times a day (TID) | RESPIRATORY_TRACT | Status: DC
Start: 2022-04-11 — End: 2022-04-12
  Administered 2022-04-11 – 2022-04-12 (×5): 3 mL via RESPIRATORY_TRACT
  Filled 2022-04-11 (×5): qty 3

## 2022-04-11 MED ORDER — SODIUM CHLORIDE 0.9 % IV SOLN
250.0000 mg | Freq: Every day | INTRAVENOUS | Status: AC
  Administered 2022-04-11 – 2022-04-12 (×2): 250 mg via INTRAVENOUS
  Filled 2022-04-11 (×2): qty 20

## 2022-04-11 MED ORDER — SODIUM CHLORIDE 0.9% IV SOLUTION: Freq: Once | INTRAVENOUS | Status: AC

## 2022-04-11 NOTE — Progress Notes (Signed)
Orthopedic Tech Progress Note Patient Details:  Margaret Henry Jan 23, 1951 [id number] Patient was supposed to have her ace bandage hip spica rewrapped but did not want to at this time. Previous spica was removed per patient's request.  Patient ID: Margaret Henry, female   DOB: 1951/04/23, 71 y.o.   MRN: [id number]  Margaret Henry 04/11/2022, 2:57 PM

## 2022-04-11 NOTE — Progress Notes (Signed)
PROGRESS NOTE    Margaret Henry  NKN:[id number] DOB: [birth year] DOA: 04/06/2022 PCP: [name], MD    Brief Narrative:   Margaret Henry is a 71 y.o. female with past medical history significant for chronic hypoxic respiratory failure on 3L [place] baseline, COPD, CAD, HLD, GERD, overactive bladder who presented from [facility] on 6/30 via POV to [facility] ED complaining of right leg pain with numbness over the last week.  Also reports increased shortness of breath.  Given urgent care unable to perform ultrasound, patient was sent to the ED for further evaluation.  Seen by pulmonology outpatient on 6/26 and began treatment for possible COPD exacerbation.  Also reports treated for pneumonia several months ago with continued cough.  Patient denies fevers, no chills, no chest pain, no abdominal pain, no constipation/diarrhea, no nausea/vomiting.  In the ED, temperature 99.2 F, HR 95, RR 15, BP 119/56, SPO2 100% on 3 L nasal cannula.  WBC 9.2, hemoglobin 6.1, MCV 70.1, platelets 364.  Sodium 139, potassium 3.6, chloride 105, CO2 27, glucose 101, BUN 7, creatinine 0.58.  Chest x-ray with no acute cardiopulmonary disease process, noted COPD changes.  Vascular duplex ultrasound right lower extremity no evidence of DVT.  FOBT negative.  Patient was transferred to [facility] long hospital under [facility] hospital service for further evaluation of symptomatic anemia.  Assessment & Plan:   Symptomatic anemia Iron and B12 deficiency Patient presenting with a hemoglobin of 6.1.  FOBT negative.  Anemia panel with iron 8, TIBC 409, ferritin 6, folate 16.1, vitamin B12 170. --Transfused 2 unit PRBCs on 7/1. --Hgb 6.1>>9.0>>8.7>8.4>7.6 --Transfuse 1 additional unit PRBC today --IV iron daily x2 --B12 1000 mcg p.o. daily --CBC daily  Right hip hematoma Right hip/pelvis with lateral soft tissue swelling with 6.3 x 4.9 cm soft tissue opacity lateral to the hip, question hematoma versus mass.  Patient denies  trauma.  On aspirin at baseline, no other anticoagulants.  Seen by orthopedics, Dr. Doran Durand on 7/3.  Recommended Spica brace and weightbearing as tolerates.  Outpatient follow-up orthopedic clinic in a few weeks.  If mass does not decrease in size, consider outpatient MRI.  Continue to hold aspirin.  Chronic hypoxic respiratory failure Advanced COPD Follows with pulmonology outpatient, Dr. Vaughan Browner.  On 3 L nasal cannula daily. --Azithromycin 250 mg p.o. daily; chronically on outpatient per pulmonology --Prednisone 10 mg p.o. daily --Breo Ellipta 1 puff daily --Incruse Ellipta 1 puff daily --Duo nebs 3 times daily --Albuterol nebs q2h PRN wheezing/shortness of breath  Left lower extremity edema/right foot pain/numbness Duplex ultrasound bilateral lower extremity negative for DVT.  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 --Continue iron and B12 repletion  Hypokalemia Repleted.  Vitamin D deficiency Vitamin D 25-hydroxy 17.72. --Vitamin D 50,000 units p.o. weekly  CAD Hyperlipidemia --Atorvastatin --Aspirin discontinued as above for hip hematoma   DVT prophylaxis: Place TED hose Start: 04/07/22 1533 SCDs Start: 04/06/22 2157    Code Status: Full Code Family Communication: No family present at bedside this morning  Disposition Plan:  Level of care: Telemetry Status is: Inpatient Remains inpatient appropriate because: Transfusion PRBC, IV iron today, if hemoglobin stable tomorrow anticipate discharge home at that time    Consultants:  Orthopedics, Dr. Doran Durand  Procedures:  Vascular duplex ultrasound bilateral lower extremities  Antimicrobials:  Azithromycin 7/1>>   Subjective:   Objective: Vitals:   04/11/22 1346 04/11/22 1421 04/11/22 1445 04/11/22 1453  BP: (!) 100/49 (!) 124/58  (!) 133/55  Pulse: 100 (!) 102  Marland Kitchen)  107  Resp: '18 18 20 18  '$ Temp: 98.4 F (36.9 C) 99.1 F (37.3 C)  98.3 F (36.8 C)  TempSrc: Oral Oral    SpO2: 99%  98%   Height:         Intake/Output Summary (Last 24 hours) at 04/11/2022 1618 Last data filed at 04/11/2022 0900 Gross per 24 hour  Intake 480 ml  Output --  Net 480 ml   There were no vitals filed for this visit.  Examination:  Physical Exam: GEN: NAD, alert and oriented x 3, wd/wn HEENT: NCAT, PERRL, EOMI, sclera clear, MMM PULM: CTAB w/o wheezes/crackles, normal respiratory effort CV: RRR w/o M/G/R GI: abd soft, NTND, NABS, no R/G/M MSK: no peripheral edema, muscle strength globally intact 5/5 bilateral upper/lower extremities NEURO: CN II-XII intact, no focal deficits, sensation to light touch intact PSYCH: normal mood/affect Integumentary: dry/intact, no rashes or wounds    Data Reviewed: I have personally reviewed following labs and imaging studies  CBC: Recent Labs  Lab 04/09/22 2148 04/10/22 0535 04/10/22 1136 04/10/22 2048 04/11/22 0549  WBC 11.6* 9.7 11.3* 10.3 8.5  HGB 8.0* 7.5* 8.7* 8.4* 7.6*  HCT 28.4* 27.1* 31.2* 30.0* 27.1*  MCV 80.9 81.9 82.1 81.3 82.6  PLT 292 274 298 306 993   Basic Metabolic Panel: Recent Labs  Lab 04/06/22 1500 04/07/22 0759 04/08/22 0644 04/09/22 0533 04/10/22 0535  NA 139 143 143 141 143  K 3.6 3.6 3.1* 3.2* 3.7  CL 105 109 109 108 111  CO2 '27 26 28 26 25  '$ GLUCOSE 101* 93 85 92 85  BUN 7* '8 8 11 11  '$ CREATININE 0.58 0.59 0.51 0.54 0.52  CALCIUM 8.8* 8.7* 8.5* 8.4* 8.8*   GFR: Estimated Creatinine Clearance: 55.3 mL/min (by C-G formula based on SCr of 0.52 mg/dL). Liver Function Tests: Recent Labs  Lab 04/07/22 0759  AST 15  ALT 12  ALKPHOS 49  BILITOT 1.1  PROT 6.0*  ALBUMIN 3.5   No results for input(s): "LIPASE", "AMYLASE" in the last 168 hours. No results for input(s): "AMMONIA" in the last 168 hours. Coagulation Profile: No results for input(s): "INR", "PROTIME" in the last 168 hours. Cardiac Enzymes: No results for input(s): "CKTOTAL", "CKMB", "CKMBINDEX", "TROPONINI" in the last 168 hours. BNP (last 3 results) No  results for input(s): "PROBNP" in the last 8760 hours. HbA1C: No results for input(s): "HGBA1C" in the last 72 hours. CBG: No results for input(s): "GLUCAP" in the last 168 hours. Lipid Profile: No results for input(s): "CHOL", "HDL", "LDLCALC", "TRIG", "CHOLHDL", "LDLDIRECT" in the last 72 hours. Thyroid Function Tests: No results for input(s): "TSH", "T4TOTAL", "FREET4", "T3FREE", "THYROIDAB" in the last 72 hours. Anemia Panel: No results for input(s): "VITAMINB12", "FOLATE", "FERRITIN", "TIBC", "IRON", "RETICCTPCT" in the last 72 hours. Sepsis Labs: No results for input(s): "PROCALCITON", "LATICACIDVEN" in the last 168 hours.  Recent Results (from the past 240 hour(s))  Respiratory or Resp and Sputum Culture     Status: Abnormal   Collection Time: 04/04/22 10:51 AM   Specimen: Sputum  Result Value Ref Range Status   MICRO NUMBER: [id number]  Final   SPECIMEN QUALITY: Adequate  Final   Source SPUTUM  Final   STATUS: FINAL  Final   GRAM STAIN:   Final    Moderate Polymorphonuclear leukocytes Moderate epithelial cells No organisms seen   ISOLATE 2: Yeast Isolated. (A)  Final    Comment: Moderate growth of Yeast Isolated. Please contact the laboratory within 3 days if further  identification is desired.         Radiology Studies: No results found.      Scheduled Meds:  atorvastatin  40 mg Oral Daily   azithromycin  250 mg Oral Daily   fluticasone furoate-vilanterol  1 puff Inhalation Daily   furosemide  20 mg Oral Daily   guaiFENesin  600 mg Oral BID   ipratropium-albuterol  3 mL Nebulization TID   predniSONE  10 mg Oral Q breakfast   sodium chloride flush  3 mL Intravenous Q12H   umeclidinium bromide  1 puff Inhalation Daily   vitamin B-12  1,000 mcg Oral Daily   Followed by   Derrill Memo ON 04/15/2022] vitamin B-12  100 mcg Oral Daily   Vitamin D (Ergocalciferol)  50,000 Units Oral Q7 days   Continuous Infusions:  ferric gluconate (FERRLECIT) IVPB 250 mg (04/11/22 1121)      LOS: 4 days    Time spent: 42 minutes spent on chart review, discussion with nursing staff, consultants, updating family and interview/physical exam; more than 50% of that time was spent in counseling and/or coordination of care.    Dolton Shaker J [place] DO [facility] Available via Epic secure chat 7am-7pm After these hours, please refer to coverage provider listed on [email or web address] 04/11/2022, 4:18 PM

## 2022-04-11 NOTE — Telephone Encounter (Signed)
Left message on voicemail to return call.   Per Dr. Margarita Rana: She will need a visit with any available Clinician or the mobile unit for further evaluation to determine appropriate management and referral if indicated

## 2022-04-11 NOTE — Telephone Encounter (Signed)
Patient currently admitted at [facility] hospital.  Low Fe and Hgb levels.

## 2022-04-12 ENCOUNTER — Other Ambulatory Visit (HOSPITAL_BASED_OUTPATIENT_CLINIC_OR_DEPARTMENT_OTHER): Payer: Self-pay

## 2022-04-12 DIAGNOSIS — J9611 Chronic respiratory failure with hypoxia: Secondary | ICD-10-CM | POA: Diagnosis not present

## 2022-04-12 DIAGNOSIS — E559 Vitamin D deficiency, unspecified: Secondary | ICD-10-CM

## 2022-04-12 DIAGNOSIS — J449 Chronic obstructive pulmonary disease, unspecified: Secondary | ICD-10-CM | POA: Diagnosis not present

## 2022-04-12 DIAGNOSIS — J9621 Acute and chronic respiratory failure with hypoxia: Secondary | ICD-10-CM | POA: Diagnosis not present

## 2022-04-12 DIAGNOSIS — D519 Vitamin B12 deficiency anemia, unspecified: Secondary | ICD-10-CM | POA: Diagnosis not present

## 2022-04-12 LAB — BASIC METABOLIC PANEL
Anion gap: 5 (ref 5–15)
BUN: 10 mg/dL (ref 8–23)
CO2: 30 mmol/L (ref 22–32)
Calcium: 8.9 mg/dL (ref 8.9–10.3)
Chloride: 108 mmol/L (ref 98–111)
Creatinine, Ser: 0.52 mg/dL (ref 0.44–1.00)
GFR, Estimated: >60 mL/min (ref >60)
Glucose, Bld: 90 mg/dL (ref 70–99)
Potassium: 4.1 mmol/L (ref 3.5–5.1)
Sodium: 143 mmol/L (ref 135–145)

## 2022-04-12 LAB — CBC
HCT: 32.2 % — ABNORMAL LOW (ref 36.0–46.0)
Hemoglobin: 9.5 g/dL — ABNORMAL LOW (ref 12.0–15.0)
MCH: 24.8 pg — ABNORMAL LOW (ref 26.0–34.0)
MCHC: 29.5 g/dL — ABNORMAL LOW (ref 30.0–36.0)
MCV: 84.1 fL (ref 80.0–100.0)
Platelets: 299 10*3/uL (ref 150–400)
RBC: 3.83 MIL/uL — ABNORMAL LOW (ref 3.87–5.11)
RDW: 23.6 % — ABNORMAL HIGH (ref 11.5–15.5)
WBC: 10.3 10*3/uL (ref 4.0–10.5)
nRBC: 0 % (ref 0.0–0.2)

## 2022-04-12 LAB — MAGNESIUM: Magnesium: 2.4 mg/dL (ref 1.7–2.4)

## 2022-04-12 LAB — TYPE AND SCREEN
ABO/RH(D): A POS
Antibody Screen: NEGATIVE
Unit division: 0

## 2022-04-12 LAB — BPAM RBC
Blood Product Expiration Date: 202307262359
ISSUE DATE / TIME: 202307051431
Unit Type and Rh: 6200

## 2022-04-12 MED ORDER — VITAMIN D (ERGOCALCIFEROL) 1.25 MG (50000 UNIT) PO CAPS
50000.0000 [IU] | ORAL_CAPSULE | ORAL | 2 refills | Status: DC
  Filled 2022-04-12: qty 4, 28d supply, fill #0
  Filled 2022-05-29: qty 4, 28d supply, fill #1
  Filled 2022-07-10: qty 4, 28d supply, fill #2

## 2022-04-12 MED ORDER — ASPIRIN 81 MG PO TBEC: 81.0000 mg | DELAYED_RELEASE_TABLET | Freq: Every day | ORAL | Status: DC

## 2022-04-12 MED ORDER — FERROUS SULFATE 325 (65 FE) MG PO TABS
325.0000 mg | ORAL_TABLET | Freq: Two times a day (BID) | ORAL | 2 refills | Status: DC
  Filled 2022-04-12: qty 100, 50d supply, fill #0

## 2022-04-12 MED ORDER — VITAMIN B-12 1000 MCG PO TABS
1000.0000 ug | ORAL_TABLET | Freq: Every day | ORAL | 2 refills | Status: AC
  Filled 2022-04-12: qty 100, 100d supply, fill #0
  Filled 2022-07-10: qty 100, 100d supply, fill #1

## 2022-04-12 NOTE — Discharge Instructions (Signed)
Continue to hold home aspirin for an additional 2 weeks due to anemia and hip hematoma.  Please call make an appoint with Dr. Doran Durand, orthopedics for 2-week follow-up appointment for hip hematoma.  Continue iron, vitamin D, vitamin B12 supplementation as this is a significant contributing factor to your anemia.  Will need outpatient follow-up with primary care physician in 1-2 weeks with repeat CBC to reassess hemoglobin level.

## 2022-04-12 NOTE — Care Management Important Message (Signed)
Important Message  Patient Details IM Letter placed in Patients room. Name: Margaret Henry MRN: [id number] Date of Birth: [birth year]   Medicare Important Message Given:  Yes     Kerin Salen 04/12/2022, 10:13 AM

## 2022-04-12 NOTE — Discharge Summary (Signed)
Physician Discharge Summary  Margaret Henry DOB: [birth year] DOA: 04/06/2022  PCP: [name], MD  Admit date: 04/06/2022 Discharge date: 04/12/2022  Admitted From: Home Disposition: Home  Recommendations for Outpatient Follow-up:  Follow up with PCP in 1-2 weeks Follow-up with orthopedics, Dr. Doran Durand 2 weeks for recheck right hip hematoma Started on vitamin D, iron and B12 supplements  Please obtain CBC in one week to assess hemoglobin level If right hip hematoma/mass does not decrease in size, consider outpatient MRI for further evaluation Continue to hold aspirin for another 2 weeks before consideration of restarting  Home Health: PT Equipment/Devices: Spica splint  Discharge Condition: Stable CODE STATUS: Full code Diet recommendation:   History of present illness:  Margaret Henry is a 71 y.o. female with past medical history significant for chronic hypoxic respiratory failure on 3L [place] baseline, COPD, CAD, HLD, GERD, overactive bladder who presented from [facility] on 6/30 via POV to [facility] ED complaining of right leg pain with numbness over the last week.  Also reports increased shortness of breath.  Given urgent care unable to perform ultrasound, patient was sent to the ED for further evaluation.   Seen by pulmonology outpatient on 6/26 and began treatment for possible COPD exacerbation.  Also reports treated for pneumonia several months ago with continued cough.   Patient denies fevers, no chills, no chest pain, no abdominal pain, no constipation/diarrhea, no nausea/vomiting.   In the ED, temperature 99.2 F, HR 95, RR 15, BP 119/56, SPO2 100% on 3 L nasal cannula. WBC 9.2, hemoglobin 6.1, MCV 70.1, platelets 364.  Sodium 139, potassium 3.6, chloride 105, CO2 27, glucose 101, BUN 7, creatinine 0.58.  Chest x-ray with no acute cardiopulmonary disease process, noted COPD changes.  Vascular duplex ultrasound right lower extremity no evidence of DVT.  FOBT  negative.  Patient was transferred to [facility] long hospital under [facility] hospital service for further evaluation of symptomatic anemia.  Hospital course:  Symptomatic anemia Iron and B12 deficiency Patient presenting with a hemoglobin of 6.1.  FOBT negative.  Anemia panel with iron 8, TIBC 409, ferritin 6, folate 16.1, vitamin B12 170.  Patient was transfused units of PRBCs during hospitalization.  Patient received 2 doses of IV iron.  Patient was started on B12 and iron supplementation.  Recommend repeat CBC 1 week to assess hemoglobin level which was 9.5 at time of discharge.  Recommend to continue to follow B12 and iron levels outpatient.   Right hip hematoma Right hip/pelvis with lateral soft tissue swelling with 6.3 x 4.9 cm soft tissue opacity lateral to the hip, question hematoma versus mass.  Patient denies trauma.  On aspirin at baseline, no other anticoagulants.  Seen by orthopedics, Dr. Doran Durand on 7/3.  Recommended Spica brace and weightbearing as tolerates.  Outpatient follow-up orthopedic clinic in a few weeks.  If mass does not decrease in size, consider outpatient MRI.  Continue to hold aspirin.   Chronic hypoxic respiratory failure Advanced COPD Follows with pulmonology outpatient, Dr. Vaughan Browner.  On 3 L nasal cannula daily.  Continue azithromycin 250 mg p.o. daily; chronically on outpatient per pulmonology.  Continue prednisone 10 mg p.o. daily, Breo Ellipta inhaler 1 puff daily, formoterol nebs twice daily, DuoNeb/albuterol nebs as needed, Spiriva Respimat 2 puffs daily.   Left lower extremity edema/right foot pain/numbness Duplex ultrasound bilateral lower extremity negative for DVT.  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 Continue iron and B12 repletion   Hypokalemia Repleted during hospitalization.   Vitamin D  deficiency Vitamin D 25-hydroxy 17.72. Vitamin D 50,000 units p.o. weekly   CAD Hyperlipidemia Continue atorvastatin.  Continue to hold aspirin on discharge for 2 weeks  for consideration of restarting due to hip hematoma.    Discharge Diagnoses:  Principal Problem:   B12 deficiency anemia Active Problems:   Hyperlipidemia   Coronary arteriosclerosis   Chronic respiratory failure (HCC)   COPD, group D, by GOLD 2017 classification ([place])   Acute on chronic respiratory failure with hypoxia (HCC)   Iron deficiency anemia   Neuropathy    Discharge Instructions  Discharge Instructions     Diet - low sodium heart healthy   Complete by: As directed    Increase activity slowly   Complete by: As directed       Allergies as of 04/12/2022       Reactions   Hydrocodone Nausea And Vomiting   Propoxyphene N-acetaminophen Nausea And Vomiting        Medication List     TAKE these medications    acetaminophen 500 MG tablet Commonly known as: TYLENOL Take 1,500 mg by mouth every 4 (four) hours as needed for headache.   albuterol (2.5 MG/3ML) 0.083% nebulizer solution Commonly known as: PROVENTIL Take 2.5 mg by nebulization every 6 (six) hours as needed for wheezing or shortness of breath. What changed: Another medication with the same name was removed. Continue taking this medication, and follow the directions you see here.   aspirin EC 81 MG tablet Commonly known as: Aspirin Adult Low Strength Take 1 tablet (81 mg total) by mouth at bedtime. Start taking on: April 26, 2022 What changed:  how much to take how to take this when to take this additional instructions These instructions start on April 26, 2022. If you are unsure what to do until then, ask your doctor or other care provider.   atorvastatin 40 MG tablet Commonly known as: LIPITOR TAKE 1 TABLET EVERY DAY   azithromycin 250 MG tablet Commonly known as: ZITHROMAX Take 1 tablet (250 mg total) by mouth daily.   Breo Ellipta 200-25 MCG/ACT Aepb Generic drug: fluticasone furoate-vilanterol Inhale 1 puff into the lungs daily.   ferrous sulfate 325 (65 FE) MG tablet Take 1 tablet  (325 mg total) by mouth 2 (two) times daily with a meal.   fluticasone 50 MCG/ACT nasal spray Commonly known as: FLONASE USE 2 SPRAYS IN EACH NOSTRIL EVERY DAY What changed:  how much to take how to take this when to take this reasons to take this additional instructions   formoterol 20 MCG/2ML nebulizer solution Commonly known as: Perforomist Take 2 mLs (20 mcg total) by nebulization 2 (two) times daily.   furosemide 20 MG tablet Commonly known as: LASIX Take 1 tablet (20 mg total) by mouth daily.   ipratropium-albuterol 0.5-2.5 (3) MG/3ML Soln Commonly known as: DUONEB Take 3 mLs by nebulization every 6 (six) hours as needed.   loratadine 10 MG tablet Commonly known as: CLARITIN TAKE 1 TABLET (10 MG TOTAL) BY MOUTH DAILY.   OXYGEN Inhale 2.5 L into the lungs daily.   predniSONE 10 MG tablet Commonly known as: DELTASONE Take 1 tablet (10 mg total) by mouth daily with breakfast.   Spiriva Respimat 2.5 MCG/ACT Aers Generic drug: Tiotropium Bromide Monohydrate INHALE 2 PUFFS EVERY DAY What changed: See the new instructions.   vitamin B-12 1000 MCG tablet Commonly known as: CYANOCOBALAMIN Take 1 tablet (1,000 mcg total) by mouth daily.   Vitamin D (Ergocalciferol) 1.25 MG (50000  UNIT) Caps capsule Commonly known as: DRISDOL Take 1 capsule (50,000 Units total) by mouth every 7 (seven) days. Start taking on: April 15, 2022        Follow-up Information     Wylene Simmer, MD. Call in 2 week(s).   Specialty: Orthopedic Surgery Contact information: [address] [place] [address] [phone]         [name], MD. Schedule an appointment as soon as possible for a visit in 1 week(s).   Specialty: Family Medicine Contact information: [address] [place] [address] [phone]                Allergies  Allergen Reactions   Hydrocodone Nausea And Vomiting   Propoxyphene N-Acetaminophen Nausea And  Vomiting    Consultations: Orthopedics, Dr. Doran Durand   Procedures/Studies: DG HIP UNILAT WITH PELVIS 2-3 VIEWS RIGHT  Result Date: 04/08/2022 CLINICAL DATA:  RIGHT hip pain, fell recently EXAM: DG HIP (WITH OR WITHOUT PELVIS) 2-3V RIGHT COMPARISON:  None FINDINGS: Osseous demineralization. Hip and SI joint spaces preserved. No acute fracture, dislocation, or bone destruction. Significant soft tissue swelling laterally at RIGHT hip with a rounded soft tissue opacity 6.3 x 4.9 cm question hematoma versus mass. Scattered atherosclerotic calcifications. IMPRESSION: Osseous demineralization without acute bony findings. Lateral soft tissue swelling with 6.3 x 4.9 cm soft tissue opacity lateral to the hip, question soft tissue hematoma versus mass; consider ultrasound evaluation. Electronically Signed   By: Lavonia Dana M.D.   On: 04/08/2022 13:27   VAS [place] LOWER EXTREMITY VENOUS (DVT)  Result Date: 04/08/2022  Lower Venous DVT Study Patient Name:  Margaret Henry  Date of Exam:   04/07/2022 Medical Rec #: [id number]       Accession #:    [id number] Date of Birth: 12-10-1950       Patient Gender: F Patient Age:   2 years Exam Location:  [facility] Procedure:      VAS [place] LOWER EXTREMITY VENOUS (DVT) Referring Phys: Jerald Kief REGALADO --------------------------------------------------------------------------------  Indications: Edema.  Risk Factors: None identified. Comparison Study: No prior studies. Performing Technologist: Oliver Hum RVT  Examination Guidelines: A complete evaluation includes B-mode imaging, spectral Doppler, color Doppler, and power Doppler as needed of all accessible portions of each vessel. Bilateral testing is considered an integral part of a complete examination. Limited examinations for reoccurring indications may be performed as noted. The reflux portion of the exam is performed with the patient in reverse Trendelenburg.   +-----+---------------+---------+-----------+----------+--------------+ RIGHTCompressibilityPhasicitySpontaneityPropertiesThrombus Aging +-----+---------------+---------+-----------+----------+--------------+ CFV  Full           Yes      Yes                                 +-----+---------------+---------+-----------+----------+--------------+   +---------+---------------+---------+-----------+----------+--------------+ LEFT     CompressibilityPhasicitySpontaneityPropertiesThrombus Aging +---------+---------------+---------+-----------+----------+--------------+ CFV      Full           Yes      Yes                                 +---------+---------------+---------+-----------+----------+--------------+ SFJ      Full                                                        +---------+---------------+---------+-----------+----------+--------------+  FV Prox  Full                                                        +---------+---------------+---------+-----------+----------+--------------+ FV Mid   Full                                                        +---------+---------------+---------+-----------+----------+--------------+ FV DistalFull                                                        +---------+---------------+---------+-----------+----------+--------------+ PFV      Full                                                        +---------+---------------+---------+-----------+----------+--------------+ POP      Full           Yes      Yes                                 +---------+---------------+---------+-----------+----------+--------------+ PTV      Full                                                        +---------+---------------+---------+-----------+----------+--------------+ PERO     Full                                                         +---------+---------------+---------+-----------+----------+--------------+    Summary: RIGHT: - No evidence of common femoral vein obstruction.  LEFT: - There is no evidence of deep vein thrombosis in the lower extremity.  - No cystic structure found in the popliteal fossa.  *See table(s) above for measurements and observations. Electronically signed by Jamelle Haring on 04/08/2022 at 12:16:08 PM.    Final    DG Chest 2 View  Result Date: 04/06/2022 CLINICAL DATA:  Shortness of breath.  Right leg numbness. EXAM: CHEST - 2 VIEW COMPARISON:  Two-view chest x-ray 01/30/2022. FINDINGS: Heart size normal. Atherosclerotic calcifications are present in the aortic arch. Changes of COPD noted. No edema or effusion is present. No focal airspace disease is present. IMPRESSION: 1. COPD. 2. No acute cardiopulmonary disease. Electronically Signed   By: San Morelle M.D.   On: 04/06/2022 15:16   US Venous Img Lower Right (DVT Study)  Result Date: 04/06/2022 CLINICAL DATA:  Right leg pain EXAM: RIGHT LOWER EXTREMITY VENOUS DOPPLER ULTRASOUND TECHNIQUE: Gray-scale sonography with graded compression, as  well as color Doppler and duplex ultrasound were performed to evaluate the lower extremity deep venous systems from the level of the common femoral vein and including the common femoral, femoral, profunda femoral, popliteal and calf veins including the posterior tibial, peroneal and gastrocnemius veins when visible. Spectral Doppler was utilized to evaluate flow at rest and with distal augmentation maneuvers in the common femoral, femoral and popliteal veins. COMPARISON:  None Available. FINDINGS: Contralateral Common Femoral Vein: Respiratory phasicity is normal and symmetric with the symptomatic side. No evidence of thrombus. Normal compressibility. Common Femoral Vein: No evidence of thrombus. Normal compressibility, respiratory phasicity and response to augmentation. Saphenofemoral Junction: No evidence of thrombus.  Normal compressibility and flow on color Doppler imaging. Profunda Femoral Vein: No evidence of thrombus. Normal compressibility and flow on color Doppler imaging. Femoral Vein: No evidence of thrombus. Normal compressibility, respiratory phasicity and response to augmentation. Popliteal Vein: No evidence of thrombus. Normal compressibility, respiratory phasicity and response to augmentation. Calf Veins: No evidence of thrombus. Normal compressibility and flow on color Doppler imaging. IMPRESSION: No evidence of deep venous thrombosis. Electronically Signed   By: Jerilynn Mages.  Shick M.D.   On: 04/06/2022 14:33     Subjective: Patient seen examined bedside, resting comfortably.  No specific complaints this morning.  Hemoglobin up to 9.5 today.  Discussed will discharge home with B12, vitamin D, iron supplementation.  Also discussed will need follow-up appoint with orthopedics in 2 weeks for reassessment of right hip hematoma.  No other questions or concerns at this time.  Denies headache, no dizziness, no chest pain, palpitations, no shortness of breath, no abdominal pain, no focal weakness, no fever/chills/night sweats, no nausea/vomiting/diarrhea.  No acute events overnight per nursing staff.  Discharge Exam: Vitals:   04/12/22 0535 04/12/22 0945  BP: (!) 96/39   Pulse: 83   Resp: 14   Temp: (!) 97.5 F (36.4 C)   SpO2: 98% 99%   Vitals:   04/11/22 1959 04/11/22 2129 04/12/22 0535 04/12/22 0945  BP:  128/64 (!) 96/39   Pulse:  90 83   Resp:  16 14   Temp:  98 F (36.7 C) (!) 97.5 F (36.4 C)   TempSrc:  Oral Oral   SpO2: 98% 100% 98% 99%  Height:        Physical Exam: GEN: NAD, alert and oriented x 3, chronically ill in appearance HEENT: NCAT, PERRL, EOMI, sclera clear, MMM PULM: CTAB w/o wheezes/crackles, normal respiratory effort, on 3 L nasal cannula which is her baseline oxygen requirement CV: RRR w/o M/G/R GI: abd soft, NTND, NABS, no R/G/M MSK: no peripheral edema, muscle strength  globally intact 5/5 bilateral upper/lower extremities NEURO: CN II-XII intact, no focal deficits, sensation to light touch intact PSYCH: normal mood/affect Integumentary: dry/intact, no rashes or wounds    The results of significant diagnostics from this hospitalization (including imaging, microbiology, ancillary and laboratory) are listed below for reference.     Microbiology: Recent Results (from the past 240 hour(s))  Respiratory or Resp and Sputum Culture     Status: Abnormal   Collection Time: 04/04/22 10:51 AM   Specimen: Sputum  Result Value Ref Range Status   MICRO NUMBER: [id number]  Final   SPECIMEN QUALITY: Adequate  Final   Source SPUTUM  Final   STATUS: FINAL  Final   GRAM STAIN:   Final    Moderate Polymorphonuclear leukocytes Moderate epithelial cells No organisms seen   ISOLATE 2: Yeast Isolated. (A)  Final  Comment: Moderate growth of Yeast Isolated. Please contact the laboratory within 3 days if further identification is desired.     Labs: BNP (last 3 results) Recent Labs    06/13/21 1241  BNP 9.9   Basic Metabolic Panel: Recent Labs  Lab 04/07/22 0759 04/08/22 0644 04/09/22 0533 04/10/22 0535 04/12/22 0655  NA 143 143 141 143 143  K 3.6 3.1* 3.2* 3.7 4.1  CL 109 109 108 111 108  CO2 '26 28 26 25 30  '$ GLUCOSE 93 85 92 85 90  BUN '8 8 11 11 10  '$ CREATININE 0.59 0.51 0.54 0.52 0.52  CALCIUM 8.7* 8.5* 8.4* 8.8* 8.9  MG  --   --   --   --  2.4   Liver Function Tests: Recent Labs  Lab 04/07/22 0759  AST 15  ALT 12  ALKPHOS 49  BILITOT 1.1  PROT 6.0*  ALBUMIN 3.5   No results for input(s): "LIPASE", "AMYLASE" in the last 168 hours. No results for input(s): "AMMONIA" in the last 168 hours. CBC: Recent Labs  Lab 04/10/22 0535 04/10/22 1136 04/10/22 2048 04/11/22 0549 04/12/22 0655  WBC 9.7 11.3* 10.3 8.5 10.3  HGB 7.5* 8.7* 8.4* 7.6* 9.5*  HCT 27.1* 31.2* 30.0* 27.1* 32.2*  MCV 81.9 82.1 81.3 82.6 84.1  PLT 274 298 306 274 299    Cardiac Enzymes: No results for input(s): "CKTOTAL", "CKMB", "CKMBINDEX", "TROPONINI" in the last 168 hours. BNP: Invalid input(s): "POCBNP" CBG: No results for input(s): "GLUCAP" in the last 168 hours. D-Dimer No results for input(s): "DDIMER" in the last 72 hours. Hgb A1c No results for input(s): "HGBA1C" in the last 72 hours. Lipid Profile No results for input(s): "CHOL", "HDL", "LDLCALC", "TRIG", "CHOLHDL", "LDLDIRECT" in the last 72 hours. Thyroid function studies No results for input(s): "TSH", "T4TOTAL", "T3FREE", "THYROIDAB" in the last 72 hours.  Invalid input(s): "FREET3" Anemia work up No results for input(s): "VITAMINB12", "FOLATE", "FERRITIN", "TIBC", "IRON", "RETICCTPCT" in the last 72 hours. Urinalysis    Component Value Date/Time   COLORURINE STRAW (A) 08/13/2021 1603   APPEARANCEUR CLEAR 08/13/2021 1603   LABSPEC 1.010 08/13/2021 1603   PHURINE 6.5 08/13/2021 1603   GLUCOSEU NEGATIVE 08/13/2021 1603   HGBUR NEGATIVE 08/13/2021 1603   HGBUR trace-lysed 01/21/2009 0954   BILIRUBINUR NEGATIVE 08/13/2021 1603   KETONESUR NEGATIVE 08/13/2021 1603   PROTEINUR NEGATIVE 08/13/2021 1603   UROBILINOGEN 0.2 01/21/2009 0954   NITRITE NEGATIVE 08/13/2021 1603   LEUKOCYTESUR NEGATIVE 08/13/2021 1603   Sepsis Labs Recent Labs  Lab 04/10/22 1136 04/10/22 2048 04/11/22 0549 04/12/22 0655  WBC 11.3* 10.3 8.5 10.3   Microbiology Recent Results (from the past 240 hour(s))  Respiratory or Resp and Sputum Culture     Status: Abnormal   Collection Time: 04/04/22 10:51 AM   Specimen: Sputum  Result Value Ref Range Status   MICRO NUMBER: [id number]  Final   SPECIMEN QUALITY: Adequate  Final   Source SPUTUM  Final   STATUS: FINAL  Final   GRAM STAIN:   Final    Moderate Polymorphonuclear leukocytes Moderate epithelial cells No organisms seen   ISOLATE 2: Yeast Isolated. (A)  Final    Comment: Moderate growth of Yeast Isolated. Please contact the laboratory within 3  days if further identification is desired.     Time coordinating discharge: Over 30 minutes  SIGNED:   Jovonna Nickell J [place] DO  [facility] 04/12/2022, 10:08 AM

## 2022-04-12 NOTE — Progress Notes (Signed)
Provided patient with AVS discharge instructions and went through information packets and information about medications. Patient had no further questions upon discharge.

## 2022-04-12 NOTE — TOC Progression Note (Signed)
Transition of Care [facility]) - Progression Note    Patient Details  Name: Margaret Henry MRN: [id number] Date of Birth: [birth year]  [facility]) CM/SW Contact  Colbey Wirtanen, Marjie Skiff, RN Phone Number: 04/12/2022, 10:42 AM  Clinical Narrative:    Spoke with pt at bedside for dc planning. Choice offered for home health services and [place] chosen. [place] liaison contacted for referral. Pt states she has 02 through [facility] and her son will bring a travel tank for her at dc.   Expected Discharge Plan: [place] Barriers to Discharge: No Barriers Identified  Expected Discharge Plan and Services Expected Discharge Plan: [place]   Discharge Planning Services: CM Consult Post Acute Care Choice: [place] arrangements for the past 2 months: Single Family Home Expected Discharge Date: 04/12/22                         HH Arranged: PT HH Agency: [place] Date [place]: 04/12/22 Time [place]: 1039 Representative spoke with at [place]: [place] ([place]) Interventions    Readmission Risk Interventions     No data to display

## 2022-04-12 NOTE — Progress Notes (Signed)
Physical Therapy Treatment Patient Details Name: Margaret Henry MRN: [id number] DOB: August 23, 1951 Today's Date: 04/12/2022   History of Present Illness Patient is 71 y/o female who presents to ED from home with Rt proximal thigh swelling, reporting no injury, and numbness in Bil feet. PMH significant for hyperlipidemia, bronchitis, Cancer, colon polyps, COPD, diverticulitis, osterporosis, dyspnea, SOB, goiter, vertigo.    PT Comments    Pt is progressing well with therapy. She ambulated ~180 ft today and completed LE strengthening exercises. Currently pt requires Supervision/Min guard for transfers and gait. Educated pt on importance of staying mobile everyday. Encouraged pt to continue to walk when assistance is available and to continue HEP to increase muscle strength. Recommending HHPT upon DC from hospital and will progress as able in acute setting.    Recommendations for follow up therapy are one component of a multi-disciplinary discharge planning process, led by the attending physician.  Recommendations may be updated based on patient status, additional functional criteria and insurance authorization.  Follow Up Recommendations  Home health PT     Assistance Recommended at Discharge Intermittent Supervision/Assistance  Patient can return home with the following A little help with walking and/or transfers;A little help with bathing/dressing/bathroom;Assistance with cooking/housework;Assist for transportation;Help with stairs or ramp for entrance   Equipment Recommendations  None recommended by PT    Recommendations for Other Services       Precautions / Restrictions Precautions Precautions: Fall Restrictions Weight Bearing Restrictions: No     Mobility  Bed Mobility               General bed mobility comments: Pt sitting at EOB upon arrival, no bed mobility demonstrated.    Transfers Overall transfer level: Needs assistance Equipment used: Rolling walker (2  wheels) Transfers: Sit to/from Stand Sit to Stand: Supervision           General transfer comment: Pt relies on RW for standing after power up, otherwise able to stand.    Ambulation/Gait Ambulation/Gait assistance: Min guard Gait Distance (Feet): 180 Feet Assistive device: Rolling walker (2 wheels) Gait Pattern/deviations: Step-through pattern, Decreased stride length, Trunk flexed Gait velocity: decr     General Gait Details: Pt required 3 rest breaks during ambulation needing to activate closed chain breathing over RW distributing UE weight. HR=122 bpm, SpO2=90%. Pt did not appear to demonstrate SOB during gait.   Stairs             Wheelchair Mobility    Modified Rankin (Stroke Patients Only)       Balance Overall balance assessment: Needs assistance Sitting-balance support: No upper extremity supported Sitting balance-Leahy Scale: Good     Standing balance support: Bilateral upper extremity supported, During functional activity, Reliant on assistive device for balance Standing balance-Leahy Scale: Fair Standing balance comment: Pt relies on Bil UE external support from RW. Demonstrated good carryover with straightening back while in standing.                            Cognition Arousal/Alertness: Awake/alert Behavior During Therapy: WFL for tasks assessed/performed Overall Cognitive Status: Within Functional Limits for tasks assessed                                 General Comments: Pt is aware and alert.        Exercises General Exercises - Lower Extremity Ankle Circles/Pumps: AROM, Both, 20  reps, Seated Long Arc Quad: 20 reps, AROM, Seated, Both Hip Flexion/Marching: AROM, Strengthening, Both, 15 reps, Seated    General Comments        Pertinent Vitals/Pain      Home Living                          Prior Function            PT Goals (current goals can now be found in the care plan section) Acute  Rehab PT Goals Patient Stated Goal: To go home PT Goal Formulation: With patient Time For Goal Achievement: 04/24/22 Potential to Achieve Goals: Good Progress towards PT goals: Progressing toward goals    Frequency    Min 2X/week      PT Plan Current plan remains appropriate    Co-evaluation              AM-PAC PT "6 Clicks" Mobility   Outcome Measure  Help needed turning from your back to your side while in a flat bed without using bedrails?: None Help needed moving from lying on your back to sitting on the side of a flat bed without using bedrails?: None Help needed moving to and from a bed to a chair (including a wheelchair)?: A Little Help needed standing up from a chair using your arms (e.g., wheelchair or bedside chair)?: A Little Help needed to walk in hospital room?: A Little Help needed climbing 3-5 steps with a railing? : A Little 6 Click Score: 20    End of Session Equipment Utilized During Treatment: Gait belt;Oxygen Activity Tolerance: Patient tolerated treatment well Patient left: in bed;with call bell/phone within reach;with nursing/sitter in room Nurse Communication: Mobility status PT Visit Diagnosis: Unsteadiness on feet (R26.81);Other abnormalities of gait and mobility (R26.89)     Time: 1426-1450 PT Time Calculation (min) (ACUTE ONLY): 24 min  Charges:  $Gait Training: 8-22 mins $Therapeutic Exercise: 8-22 mins                     Margie Ege, SPT [place] 04/12/2022, 3:18 PM

## 2022-04-16 ENCOUNTER — Telehealth: Payer: Self-pay

## 2022-04-16 NOTE — Telephone Encounter (Signed)
We do not treat yeast in the sputum as it is considered a colonizer and not a source of infection. I will discuss in detail a time of return visit

## 2022-04-16 NOTE — Telephone Encounter (Signed)
Transition Care Management Follow-up Telephone Call Date of discharge and from where: 04/12/2022, [facility] How have you been since you were released from the hospital? She stated she is feeling much better Any questions or concerns? No  Items Reviewed: Did the pt receive and understand the discharge instructions provided? Yes  Medications obtained and verified? Yes - she said she has all of her medications and a nebulizer and did not have any questions about the med regime.  Other? No  Any new allergies since your discharge? No  Dietary orders reviewed? No Do you have support at home? Yes   Home Care and Equipment/Supplies: Were home health services ordered? yes If so, what is the name of the agency? [facility] Has the agency set up a time to come to the patient's home? yes Were any new equipment or medical supplies ordered?  No What is the name of the medical supply agency? N/a Were you able to get the supplies/equipment? not applicable Do you have any questions related to the use of the equipment or supplies? No  She already has a shower chair  Functional Questionnaire: (I = Independent and D = Dependent) ADLs: independent with ambulation and personal care.  She has home O2 that she uses @ 3L at night. She said during the day she is able to go 3-4 hours without it.     Follow up appointments reviewed:  PCP Hospital f/u appt confirmed? Yes  Scheduled to see Dr Margarita Rana - 04/17/2022.  She wanted to be seen before her scheduled appointment on 05/15/2022,  [facility] f/u appt confirmed? Yes  Scheduled to see cardiology - 05/25/2022.   Are transportation arrangements needed? No  If their condition worsens, is the pt aware to call PCP or go to the Emergency Dept.? Yes Was the patient provided with contact information for the PCP's office or ED? Yes Was to pt encouraged to call back with questions or concerns? Yes

## 2022-04-16 NOTE — Telephone Encounter (Signed)
From the discharge call:    She stated she is feeling much better.  she said she has all of her medications and a nebulizer and did not have any questions about the med regime.   independent with ambulation and personal care.  She has home O2 that she uses @ 3L at night. She said during the day she is able to go 3-4 hours without it.     Scheduled to see Dr Margarita Rana - 04/17/2022.  She wanted to be seen before her scheduled appointment on 05/15/2022,

## 2022-04-16 NOTE — Telephone Encounter (Signed)
Called and spoke with patient to let her know the recs from Dr. Vaughan Browner. She expressed understanding. Nothing further needed at this time.

## 2022-04-17 ENCOUNTER — Ambulatory Visit: Payer: Medicare HMO | Attending: Family Medicine | Admitting: Family Medicine

## 2022-04-17 ENCOUNTER — Encounter: Payer: Self-pay | Admitting: Family Medicine

## 2022-04-17 ENCOUNTER — Other Ambulatory Visit (HOSPITAL_BASED_OUTPATIENT_CLINIC_OR_DEPARTMENT_OTHER): Payer: Self-pay

## 2022-04-17 VITALS — BP 119/70 | HR 92 | Temp 97.7°F | Ht 64.0 in | Wt 121.0 lb

## 2022-04-17 DIAGNOSIS — R2241 Localized swelling, mass and lump, right lower limb: Secondary | ICD-10-CM

## 2022-04-17 DIAGNOSIS — R053 Chronic cough: Secondary | ICD-10-CM | POA: Diagnosis not present

## 2022-04-17 DIAGNOSIS — J449 Chronic obstructive pulmonary disease, unspecified: Secondary | ICD-10-CM | POA: Diagnosis not present

## 2022-04-17 DIAGNOSIS — L0390 Cellulitis, unspecified: Secondary | ICD-10-CM

## 2022-04-17 DIAGNOSIS — D649 Anemia, unspecified: Secondary | ICD-10-CM | POA: Diagnosis not present

## 2022-04-17 MED ORDER — LORATADINE 10 MG PO TABS
10.0000 mg | ORAL_TABLET | Freq: Every day | ORAL | 1 refills | Status: DC
  Filled 2022-04-17: qty 100, 90d supply, fill #0
  Filled 2022-10-23: qty 100, 90d supply, fill #1

## 2022-04-17 MED ORDER — CEPHALEXIN 500 MG PO CAPS
500.0000 mg | ORAL_CAPSULE | Freq: Two times a day (BID) | ORAL | 0 refills | Status: DC
  Filled 2022-04-17: qty 14, 7d supply, fill #0

## 2022-04-17 NOTE — Progress Notes (Signed)
Subjective:  Patient ID: Margaret Henry, female    DOB: Feb 08, 1951  Age: 71 y.o. MRN: [id number]  CC: Hospitalization Follow-up   HPI Margaret Henry is a 71 y.o. year old female with a history of COPD, previous tobacco abuse, GERD, hyperlipidemia, chronic respiratory failure (of 2 L of oxygen at rest). She presents today for transition of care visit. Hospitalized 04/06/2022 through 04/12/2022 for symptomatic anemia after she presented with hemoglobin of 6.1, b12 deficiency.  Also found to have right hip hematoma versus mass.  Transfused 3 units PRBC and IV iron.   R hip xray revealed: IMPRESSION: Osseous demineralization without acute bony findings.   Lateral soft tissue swelling with 6.3 x 4.9 cm soft tissue opacity lateral to the hip, question soft tissue hematoma versus mass; consider ultrasound evaluation.  She was also treated for COPD exacerbation. Aspirin placed on hold at discharge.  Interval History:  She presents today accompanied by her son and reports doing well and is doing well on her ferrous sulfate. She still is unsure of how her R hip mass came about as she has no history of trauma but states the bruising has improved she has no pain. She needs a referral to Dr Doran Durand whom she has an upcoming appointment to follow up on R hip mass.  She continues to cough and this is productive of yellowish greenish sputum which has been present for years. She continues to take her antitussive, Prednisone, Azithromycin as per Pulmonary recommendations. Endorses adherence with her inhalers and nebulizers. She has questions about her sputum culture which was done by Pulmonary and yielded yeast organism. Reviewed chart and notes from Pulmonary indicate this is a colonizer and no treatment indicated.  She has noticed some redness on the toes of both feet. Past Medical History:  Diagnosis Date   Allergy    hayfever   Bronchitis    Cancer ([place])    skin cancer on chest   Chest pain  06/12/2016   Colon polyps    Complication of anesthesia    pt states was given too much during nasal surgery 1989; difficulty getting awake   Complication of anesthesia 05/16/2021   pt states was given too much during nasal surgery 1989; difficulty getting awake   COPD (chronic obstructive pulmonary disease) ([place])    COPD exacerbation ([place]) 06/01/2013   Coronary artery calcification    Diverticulitis    Diverticulitis 05/16/2021   occasional   Dyspnea 06/27/2016   GERD (gastroesophageal reflux disease)    occasional   Hemorrhoids    High cholesterol    Hyperlipidemia LDL goal <70    Mild aortic insufficiency    Osteoporosis    Pneumonia    Shortness of breath dyspnea    with exertion    Thyroid goiter    bx benign   Tobacco abuse    Vertigo    Vertigo 04/21/2017    Past Surgical History:  Procedure Laterality Date   ABDOMINAL HYSTERECTOMY     BILATERAL OOPHORECTOMY  2001   for benign ovarian mass    BIOPSY THYROID     DENTAL SURGERY     dentures   NASAL SEPTUM SURGERY     PARTIAL HYSTERECTOMY  1976   for heavy menses    RECTAL EXAM UNDER ANESTHESIA N/A 12/14/2015   Procedure: RECTAL EXAM UNDER ANESTHESIA REMOVAL OF ANAL CANAL MASS; INTERNAL HEMORRHOID LIGATION, EXTERNAL HEMORRHOID LIGATION;  Surgeon: Michael Boston, MD;  Location: [facility];  Service: General;  Laterality:  N/A;   TUBAL LIGATION     WISDOM TOOTH EXTRACTION      Family History  Problem Relation Age of Onset   Heart disease Mother    COPD Father    Lung cancer Maternal Grandfather    Liver cancer Paternal Grandmother    Colon cancer Neg Hx    Colon polyps Neg Hx    Esophageal cancer Neg Hx    Rectal cancer Neg Hx    Stomach cancer Neg Hx     Social History   Socioeconomic History   Marital status: Widowed    Spouse name: Not on file   Number of children: Not on file   Years of education: Not on file   Highest education level: Not on file  Occupational History   Not on file  Tobacco Use   Smoking  status: Former    Packs/day: 1.50    Years: 49.00    Total pack years: 73.50    Types: Cigarettes    Quit date: 11/03/2018    Years since quitting: 3.4   Smokeless tobacco: Never  Vaping Use   Vaping Use: Never used  Substance and Sexual Activity   Alcohol use: No    Alcohol/week: 0.0 standard drinks of alcohol   Drug use: No   Sexual activity: Not on file  Other Topics Concern   Not on file  Social History Narrative   Not on file   Social Determinants of Health   Financial Resource Strain: Not on file  Food Insecurity: Not on file  Transportation Needs: Not on file  Physical Activity: Not on file  Stress: Not on file  Social Connections: Not on file    Allergies  Allergen Reactions   Hydrocodone Nausea And Vomiting   Propoxyphene N-Acetaminophen Nausea And Vomiting    Outpatient Medications Prior to Visit  Medication Sig Dispense Refill   acetaminophen (TYLENOL) 500 MG tablet Take 1,500 mg by mouth every 4 (four) hours as needed for headache.      albuterol (PROVENTIL) (2.5 MG/3ML) 0.083% nebulizer solution Take 2.5 mg by nebulization every 6 (six) hours as needed for wheezing or shortness of breath.     atorvastatin (LIPITOR) 40 MG tablet TAKE 1 TABLET EVERY DAY (Patient taking differently: Take 40 mg by mouth daily.) 90 tablet 3   azithromycin (ZITHROMAX) 250 MG tablet Take 1 tablet (250 mg total) by mouth daily. 30 tablet 3   BREO ELLIPTA 200-25 MCG/ACT AEPB Inhale 1 puff into the lungs daily.     ferrous sulfate 325 (65 FE) MG tablet Take 1 tablet (325 mg total) by mouth 2 (two) times daily with a meal. 100 tablet 2   fluticasone (FLONASE) 50 MCG/ACT nasal spray USE 2 SPRAYS IN EACH NOSTRIL EVERY DAY (Patient taking differently: Place 2 sprays into both nostrils daily as needed for allergies.) 16 g 6   formoterol (PERFOROMIST) 20 MCG/2ML nebulizer solution Take 2 mLs (20 mcg total) by nebulization 2 (two) times daily. 120 mL 5   furosemide (LASIX) 20 MG tablet Take 1  tablet (20 mg total) by mouth daily. 5 tablet 0   ipratropium-albuterol (DUONEB) 0.5-2.5 (3) MG/3ML SOLN Take 3 mLs by nebulization every 6 (six) hours as needed. 180 mL 0   OXYGEN Inhale 2.5 L into the lungs daily.     predniSONE (DELTASONE) 10 MG tablet Take 1 tablet (10 mg total) by mouth daily with breakfast. 30 tablet 11   Tiotropium Bromide Monohydrate (SPIRIVA RESPIMAT) 2.5 MCG/ACT AERS  INHALE 2 PUFFS EVERY DAY (Patient taking differently: Take 2 each by mouth daily.) 12 g 3   vitamin B-12 (CYANOCOBALAMIN) 1000 MCG tablet Take 1 tablet (1,000 mcg total) by mouth daily. 100 tablet 2   Vitamin D, Ergocalciferol, (DRISDOL) 1.25 MG (50000 UNIT) CAPS capsule Take 1 capsule (50,000 Units total) by mouth every 7 (seven) days. 4 capsule 2   loratadine (CLARITIN) 10 MG tablet TAKE 1 TABLET (10 MG TOTAL) BY MOUTH DAILY. 100 tablet 2   [START ON 04/26/2022] aspirin EC (ASPIRIN ADULT LOW STRENGTH) 81 MG tablet Take 1 tablet (81 mg total) by mouth at bedtime. (Patient not taking: Reported on 04/17/2022)     No facility-administered medications prior to visit.     ROS Review of Systems  Constitutional:  Negative for activity change, appetite change and fatigue.  HENT:  Negative for congestion, sinus pressure and sore throat.   Eyes:  Negative for visual disturbance.  Respiratory:  Positive for cough. Negative for chest tightness, shortness of breath and wheezing.   Cardiovascular:  Negative for chest pain and palpitations.  Gastrointestinal:  Negative for abdominal distention, abdominal pain and constipation.  Endocrine: Negative for polydipsia.  Genitourinary:  Negative for dysuria and frequency.  Musculoskeletal:        See HPI  Skin:  Negative for rash.  Neurological:  Negative for tremors, light-headedness and numbness.  Hematological:  Does not bruise/bleed easily.  Psychiatric/Behavioral:  Negative for agitation and behavioral problems.     Objective:  BP 119/70   Pulse 92   Temp 97.7  F (36.5 C) (Oral)   Ht '5\' 4"'$  (1.626 m)   Wt 121 lb (54.9 kg)   SpO2 92%   BMI 20.77 kg/m      04/17/2022    3:02 PM 04/12/2022    1:28 PM 04/12/2022    5:35 AM  BP/Weight  Systolic BP 443 154 96  Diastolic BP 70 53 39  Wt. (Lbs) 121    BMI 20.77 kg/m2        Physical Exam Constitutional:      Appearance: She is well-developed.  HENT:     Nose:     Comments: On 3 L via nasal canula Cardiovascular:     Rate and Rhythm: Normal rate.     Pulses: Normal pulses.     Heart sounds: Normal heart sounds. No murmur heard. Pulmonary:     Effort: Pulmonary effort is normal.     Breath sounds: Normal breath sounds. No wheezing or rales.  Chest:     Chest wall: No tenderness.  Abdominal:     General: Bowel sounds are normal. There is no distension.     Palpations: Abdomen is soft. There is no mass.     Tenderness: There is no abdominal tenderness.  Musculoskeletal:     Right lower leg: No edema.     Left lower leg: No edema.     Comments: Large R hip hard mass non TTP with surrounding bleeding of the thigh Bilateral great toes, R second toe with erythema around upper part of toe Normal Dorsalis pedis and Posterior tibialis pulses  Neurological:     Mental Status: She is alert and oriented to person, place, and time.  Psychiatric:        Mood and Affect: Mood normal.        Latest Ref Rng & Units 04/12/2022    6:55 AM 04/10/2022    5:35 AM 04/09/2022    5:33 AM  CMP  Glucose 70 - 99 mg/dL 90  85  92   BUN 8 - 23 mg/dL '10  11  11   '$ Creatinine 0.44 - 1.00 mg/dL 0.52  0.52  0.54   Sodium 135 - 145 mmol/L 143  143  141   Potassium 3.5 - 5.1 mmol/L 4.1  3.7  3.2   Chloride 98 - 111 mmol/L 108  111  108   CO2 22 - 32 mmol/L '30  25  26   '$ Calcium 8.9 - 10.3 mg/dL 8.9  8.8  8.4     Lipid Panel     Component Value Date/Time   CHOL 143 01/30/2022 1158   CHOL 147 02/15/2021 1139   TRIG 132.0 01/30/2022 1158   HDL 74.30 01/30/2022 1158   HDL 58 02/15/2021 1139   CHOLHDL 2  01/30/2022 1158   VLDL 26.4 01/30/2022 1158   LDLCALC 42 01/30/2022 1158   LDLCALC 70 02/15/2021 1139   LDLDIRECT 166 (H) 09/26/2015 1001    CBC    Component Value Date/Time   WBC 10.3 04/12/2022 0655   RBC 3.83 (L) 04/12/2022 0655   HGB 9.5 (L) 04/12/2022 0655   HGB 10.1 (L) 11/18/2020 1201   HCT 32.2 (L) 04/12/2022 0655   HCT 32.4 (L) 11/18/2020 1201   PLT 299 04/12/2022 0655   PLT 373 11/18/2020 1201   MCV 84.1 04/12/2022 0655   MCV 83 11/18/2020 1201   MCH 24.8 (L) 04/12/2022 0655   MCHC 29.5 (L) 04/12/2022 0655   RDW 23.6 (H) 04/12/2022 0655   RDW 15.4 11/18/2020 1201   LYMPHSABS 1.0 08/13/2021 1623   MONOABS 0.3 08/13/2021 1623   EOSABS 0.0 08/13/2021 1623   BASOSABS 0.0 08/13/2021 1623    Lab Results  Component Value Date   HGBA1C 5.9 (H) 02/15/2021    Assessment & Plan:   1. Anemia, unspecified type Hemoglobin at discharge was 9.5 Will repeat CBC Continue ferrous sulfate - CBC with Differential/Platelet  2. Cellulitis, unspecified cellulitis site Erythema of toes suspicious for cellulitis Pedal pulses are normal - cephALEXin (KEFLEX) 500 MG capsule; Take 1 capsule (500 mg total) by mouth 2 (two) times daily.  Dispense: 14 capsule; Refill: 0  3. Mass of right hip region Mass vs hematoma - AMB referral to orthopedics  4. Chronic cough Uncontrolled - secondary to COPD Most recent cxr is negative for acute process Currently on Prednisone, antitussive, inhalers Follow up with Pulmonary  5. COPD, group D, by GOLD 2017 classification ([place]) Symptomatic See #4   Meds ordered this encounter  Medications   loratadine (CLARITIN) 10 MG tablet    Sig: Take 1 tablet (10 mg total) by mouth daily.    Dispense:  100 tablet    Refill:  1   cephALEXin (KEFLEX) 500 MG capsule    Sig: Take 1 capsule (500 mg total) by mouth 2 (two) times daily.    Dispense:  14 capsule    Refill:  0    Follow-up: Return for follow up, keep previously scheduled appointment.        [name], MD, FAAFP. [facility] [place], [place]   04/17/2022, 6:51 PM

## 2022-04-17 NOTE — Progress Notes (Signed)
Constant cough with mucus.

## 2022-04-17 NOTE — Patient Instructions (Signed)

## 2022-04-18 LAB — CBC WITH DIFFERENTIAL/PLATELET
Basophils Absolute: 0.1 10*3/uL (ref 0.0–0.2)
Basos: 1 %
EOS (ABSOLUTE): 0 10*3/uL (ref 0.0–0.4)
Eos: 0 %
Hematocrit: 38.7 % (ref 34.0–46.6)
Hemoglobin: 11.5 g/dL (ref 11.1–15.9)
Immature Grans (Abs): 0.1 10*3/uL (ref 0.0–0.1)
Immature Granulocytes: 1 %
Lymphocytes Absolute: 0.7 10*3/uL (ref 0.7–3.1)
Lymphs: 7 %
MCH: 25.1 pg — ABNORMAL LOW (ref 26.6–33.0)
MCHC: 29.7 g/dL — ABNORMAL LOW (ref 31.5–35.7)
MCV: 85 fL (ref 79–97)
Monocytes Absolute: 0.2 10*3/uL (ref 0.1–0.9)
Monocytes: 2 %
Neutrophils Absolute: 8.8 10*3/uL — ABNORMAL HIGH (ref 1.4–7.0)
Neutrophils: 89 %
Platelets: 423 10*3/uL (ref 150–450)
RBC: 4.58 x10E6/uL (ref 3.77–5.28)
RDW: 25.6 % — ABNORMAL HIGH (ref 11.7–15.4)
WBC: 9.9 10*3/uL (ref 3.4–10.8)

## 2022-04-23 ENCOUNTER — Telehealth: Payer: Self-pay | Admitting: Emergency Medicine

## 2022-04-23 DIAGNOSIS — S7001XD Contusion of right hip, subsequent encounter: Secondary | ICD-10-CM | POA: Diagnosis not present

## 2022-04-23 NOTE — Telephone Encounter (Signed)
Copied from [place] [id number]. Topic: Quick Communication - Home Health Verbal Orders >> Apr 23, 2022  8:34 AM Leilani Able wrote: Caller/Agency: [facility] [place] Number: [phone] Requesting PT  Frequency: 1 time a wk for 2 wk /2 time a wk for 4 wk and 1 time a wk for 3 wk

## 2022-04-24 ENCOUNTER — Ambulatory Visit (HOSPITAL_BASED_OUTPATIENT_CLINIC_OR_DEPARTMENT_OTHER)
Admission: RE | Admit: 2022-04-24 | Discharge: 2022-04-24 | Disposition: A | Discharge status: Home / Self Care | Payer: Medicare HMO | Source: Ambulatory Visit | Attending: Family Medicine | Admitting: Family Medicine

## 2022-04-24 DIAGNOSIS — Z87891 Personal history of nicotine dependence: Secondary | ICD-10-CM | POA: Diagnosis not present

## 2022-04-24 NOTE — Telephone Encounter (Signed)
Call placed to gracie and VM was left informing her to return phone call to get patients information for verbal orders.

## 2022-04-26 ENCOUNTER — Telehealth: Payer: Self-pay | Admitting: Pulmonary Disease

## 2022-04-26 NOTE — Telephone Encounter (Signed)
Received call report from [facility] with [facility] on patient's lung cancer screen CT done on 04/24/22. Sarah please review the result/impression copied below:  IMPRESSION: 1. New irregular solid subpleural pulmonary nodule of the left lower lobe measuring 7.2 mm. Lung-RADS 4A, suspicious. Follow up low-dose chest CT without contrast in 3 months (please use the following order, CT CHEST LCS NODULE FOLLOW-UP W/O CM) is recommended. Alternatively, PET may be considered when there is a solid component 47m or larger. 2. New linear opacity of the right middle lobe, likely due to atelectasis or scarring. Recommend attention on follow-up. 3. Severe three-vessel coronary artery calcifications. 4. Aortic Atherosclerosis (ICD10-I70.0) and Emphysema (ICD10-J43.9).  Please advise, thank you.

## 2022-04-26 NOTE — Telephone Encounter (Signed)
Margaret Henry with [facility] called in stating she missed a call from [place]. She is just needing to get the verbal orders previously requested for patients PT. Please assist further

## 2022-04-26 NOTE — Telephone Encounter (Signed)
Gracie was called and VM was left informing her of verbal orders for patient's PT.

## 2022-04-27 ENCOUNTER — Telehealth: Payer: Self-pay | Admitting: Acute Care

## 2022-04-27 DIAGNOSIS — R911 Solitary pulmonary nodule: Secondary | ICD-10-CM

## 2022-04-27 DIAGNOSIS — Z87891 Personal history of nicotine dependence: Secondary | ICD-10-CM

## 2022-04-27 NOTE — Telephone Encounter (Signed)
I have called the patient with the results of her low dose CT Chest. I explained that her scan was read as a Lung RADS 4 A : suspicious findings, either short term follow up in 3 months or alternatively  PET Scan evaluation may be considered when there is a solid component of  8 mm or larger. I told her there is a new 7.2 mm nodule in the left lower lobe that we need to take a look at again in 3 months. She is in agreement with this plan, and has verbalized understanding of the above.  Denise, please place order for 3 month follow up low dose and fax the results to PCP . I will copy Dr. Vaughan Browner on this so he is aware.  Repeat scan due Middle of 07/2022. Thanks so much

## 2022-04-28 ENCOUNTER — Encounter (HOSPITAL_BASED_OUTPATIENT_CLINIC_OR_DEPARTMENT_OTHER): Payer: Self-pay | Admitting: Pharmacy Technician

## 2022-04-28 ENCOUNTER — Other Ambulatory Visit: Payer: Self-pay

## 2022-04-28 ENCOUNTER — Emergency Department (HOSPITAL_BASED_OUTPATIENT_CLINIC_OR_DEPARTMENT_OTHER)
Admission: EM | Admit: 2022-04-28 | Discharge: 2022-04-28 | Disposition: A | Discharge status: Home / Self Care | Payer: Medicare HMO | Attending: Emergency Medicine | Admitting: Emergency Medicine

## 2022-04-28 ENCOUNTER — Emergency Department (HOSPITAL_BASED_OUTPATIENT_CLINIC_OR_DEPARTMENT_OTHER): Payer: Medicare HMO

## 2022-04-28 DIAGNOSIS — X500XXA Overexertion from strenuous movement or load, initial encounter: Secondary | ICD-10-CM | POA: Diagnosis not present

## 2022-04-28 DIAGNOSIS — R110 Nausea: Secondary | ICD-10-CM | POA: Insufficient documentation

## 2022-04-28 DIAGNOSIS — Z7982 Long term (current) use of aspirin: Secondary | ICD-10-CM | POA: Diagnosis not present

## 2022-04-28 DIAGNOSIS — S39012A Strain of muscle, fascia and tendon of lower back, initial encounter: Secondary | ICD-10-CM | POA: Insufficient documentation

## 2022-04-28 DIAGNOSIS — M545 Low back pain, unspecified: Secondary | ICD-10-CM | POA: Diagnosis not present

## 2022-04-28 DIAGNOSIS — S3992XA Unspecified injury of lower back, initial encounter: Secondary | ICD-10-CM | POA: Diagnosis present

## 2022-04-28 DIAGNOSIS — M549 Dorsalgia, unspecified: Secondary | ICD-10-CM | POA: Diagnosis not present

## 2022-04-28 MED ORDER — LIDOCAINE 5 % EX PTCH
1.0000 | MEDICATED_PATCH | Freq: Once | CUTANEOUS | Status: DC
  Administered 2022-04-28: 1 via TRANSDERMAL
  Filled 2022-04-28: qty 1

## 2022-04-28 MED ORDER — DEXAMETHASONE SODIUM PHOSPHATE 10 MG/ML IJ SOLN: 10.0000 mg | Freq: Once | INTRAMUSCULAR | Status: DC

## 2022-04-28 MED ORDER — ONDANSETRON HCL 4 MG PO TABS
4.0000 mg | ORAL_TABLET | Freq: Three times a day (TID) | ORAL | 0 refills | Status: DC | PRN
  Filled 2022-04-28: qty 8, 3d supply, fill #0

## 2022-04-28 MED ORDER — MORPHINE SULFATE (PF) 4 MG/ML IV SOLN: 4.0000 mg | Freq: Once | INTRAVENOUS | Status: DC

## 2022-04-28 MED ORDER — LIDOCAINE 5 % EX PTCH
1.0000 | MEDICATED_PATCH | CUTANEOUS | 0 refills | Status: DC
Start: 2022-04-28 — End: 2022-05-23
  Filled 2022-04-28: qty 30, 30d supply, fill #0

## 2022-04-28 MED ORDER — DEXAMETHASONE SODIUM PHOSPHATE 10 MG/ML IJ SOLN
10.0000 mg | Freq: Once | INTRAMUSCULAR | Status: AC
  Administered 2022-04-28: 10 mg via INTRAMUSCULAR
  Filled 2022-04-28: qty 1

## 2022-04-28 MED ORDER — MORPHINE SULFATE (PF) 4 MG/ML IV SOLN
4.0000 mg | Freq: Once | INTRAVENOUS | Status: AC
  Administered 2022-04-28: 4 mg via INTRAMUSCULAR
  Filled 2022-04-28: qty 1

## 2022-04-28 MED ORDER — ONDANSETRON 4 MG PO TBDP
4.0000 mg | ORAL_TABLET | Freq: Once | ORAL | Status: AC
  Administered 2022-04-28: 4 mg via ORAL
  Filled 2022-04-28: qty 1

## 2022-04-28 NOTE — Discharge Instructions (Addendum)
It was a pleasure taking care of you today!   Your CT scan didn't show any fractures or dislocations of your back.  At home you may take 500 mg Tylenol every 6 hours as needed for your symptoms.  You may place ice or heat to the affected area for up to 15 minutes at a time.  Ensure to place a barrier between your skin and the ice or heat.  You will also be sent a prescription for Lidoderm patch take as prescribed.  You have refills of prednisone available at your pharmacy, you may take the prednisone as prescribed for your symptoms.  You also be sent a prescription for Zofran to use as needed for nausea or vomiting.  Ensure to maintain small sips of fluids after taking the Zofran.  You may follow-up with your primary care provider as needed.  Return to the ED if you are experiencing increasing/worsening back pain, inability to walk, worsening symptoms.

## 2022-04-28 NOTE — ED Notes (Signed)
Pt called out feeling nauseated. PA made aware. New order for Zofran received.

## 2022-04-28 NOTE — ED Triage Notes (Signed)
Pt here via ems from home with reports of back pain after getting out of bed and stretching. Pt states she felt a pop and believes she pulled a muscle. VSS with ems.

## 2022-04-28 NOTE — ED Notes (Signed)
Pt endorses ongoing nausea. Pt tolerating PO without issue.

## 2022-04-28 NOTE — ED Provider Notes (Signed)
[facility] EMERGENCY DEPARTMENT Provider Note   CSN: [id number] Arrival date & time: 04/28/22  1108     History  Chief Complaint  Patient presents with   Back Pain    Margaret Henry is a 71 y.o. female who presents to the ED complaining of lower back pain after getting out of bed and stretching.  She thinks that she popped and pulled a muscle.  Denies recent injury or trauma.  Patient notes that she was able to ambulate to the toilet as well as to the door after the incident.  No meds tried prior to arrival.  Has a history of similar symptoms.  Denies numbness, tingling, weakness, bowel/bladder incontinence, urinary symptoms.  The history is provided by the patient. No language interpreter was used.       Home Medications Prior to Admission medications   Medication Sig Start Date End Date Taking? Authorizing Provider  acetaminophen (TYLENOL) 500 MG tablet Take 1,500 mg by mouth every 4 (four) hours as needed for headache.     [provider]  albuterol (PROVENTIL) (2.5 MG/3ML) 0.083% nebulizer solution Take 2.5 mg by nebulization every 6 (six) hours as needed for wheezing or shortness of breath.    [provider]  aspirin EC (ASPIRIN ADULT LOW STRENGTH) 81 MG tablet Take 1 tablet (81 mg total) by mouth at bedtime. Patient not taking: Reported on 04/17/2022 04/26/22   [place] Donnamarie Poag, DO  atorvastatin (LIPITOR) 40 MG tablet TAKE 1 TABLET EVERY DAY Patient taking differently: Take 40 mg by mouth daily. 12/15/21   Park Liter, MD  azithromycin (ZITHROMAX) 250 MG tablet Take 1 tablet (250 mg total) by mouth daily. 04/02/22   Mannam, Hart Robinsons, MD  BREO ELLIPTA 200-25 MCG/ACT AEPB Inhale 1 puff into the lungs daily. 04/05/22   [provider]  cephALEXin (KEFLEX) 500 MG capsule Take 1 capsule (500 mg total) by mouth 2 (two) times daily. 04/17/22   [name], MD  ferrous sulfate 325 (65 FE) MG tablet Take 1 tablet (325 mg total) by mouth 2  (two) times daily with a meal. 04/12/22 07/11/22  [place] Eric J, DO  fluticasone (FLONASE) 50 MCG/ACT nasal spray USE 2 SPRAYS IN EACH NOSTRIL EVERY DAY Patient taking differently: Place 2 sprays into both nostrils daily as needed for allergies. 02/11/20   [name], MD  formoterol (PERFOROMIST) 20 MCG/2ML nebulizer solution Take 2 mLs (20 mcg total) by nebulization 2 (two) times daily. 03/08/22   Margaretha Seeds, MD  furosemide (LASIX) 20 MG tablet Take 1 tablet (20 mg total) by mouth daily. 04/03/22   [name], MD  ipratropium-albuterol (DUONEB) 0.5-2.5 (3) MG/3ML SOLN Take 3 mLs by nebulization every 6 (six) hours as needed. 03/13/22   [name], MD  loratadine (CLARITIN) 10 MG tablet Take 1 tablet (10 mg total) by mouth daily. 04/17/22   [name], MD  OXYGEN Inhale 2.5 L into the lungs daily.    [provider]  predniSONE (DELTASONE) 10 MG tablet Take 1 tablet (10 mg total) by mouth daily with breakfast. 04/04/22   Mannam, Praveen, MD  Tiotropium Bromide Monohydrate (SPIRIVA RESPIMAT) 2.5 MCG/ACT AERS INHALE 2 PUFFS EVERY DAY Patient taking differently: Take 2 each by mouth daily. 04/05/22   Mannam, Hart Robinsons, MD  vitamin B-12 (CYANOCOBALAMIN) 1000 MCG tablet Take 1 tablet (1,000 mcg total) by mouth daily. 04/12/22 07/21/22  [place] Eric J, DO  Vitamin D, Ergocalciferol, (DRISDOL) 1.25 MG (50000 UNIT) CAPS capsule Take 1  capsule (50,000 Units total) by mouth every 7 (seven) days. 04/15/22 07/14/22  [place] Eric J, DO      Allergies    Hydrocodone and Propoxyphene n-acetaminophen    Review of Systems   Review of Systems  Musculoskeletal:  Positive for back pain. Negative for gait problem.  Skin:  Negative for color change and wound.  Neurological:  Negative for weakness and numbness.       -Tingling  All other systems reviewed and are negative.   Physical Exam Updated Vital Signs BP 139/62   Pulse 93   Temp 97.9 F (36.6 C)   Resp 18   SpO2 99%  Physical  Exam Vitals and nursing note reviewed.  Constitutional:      General: She is not in acute distress.    Appearance: She is not diaphoretic.  HENT:     Head: Normocephalic and atraumatic.     Mouth/Throat:     Pharynx: No oropharyngeal exudate.  Eyes:     General: No scleral icterus.    Conjunctiva/sclera: Conjunctivae normal.  Cardiovascular:     Rate and Rhythm: Normal rate and regular rhythm.     Pulses: Normal pulses.     Heart sounds: Normal heart sounds.  Pulmonary:     Effort: Pulmonary effort is normal. No respiratory distress.     Breath sounds: Normal breath sounds. No wheezing.  Abdominal:     General: Bowel sounds are normal.     Palpations: Abdomen is soft. There is no mass.     Tenderness: There is no abdominal tenderness. There is no guarding or rebound.  Musculoskeletal:        General: Normal range of motion.     Cervical back: Normal range of motion and neck supple.     Comments: Lumbar spinal tenderness to palpation.  No tenderness to palpation noted to lumbar musculature of back.  No C, T spinal tenderness to palpation.  Negative straight leg raise bilaterally.  Sensation intact to bilateral lower extremities. No CVA TTP noted bilaterally.  Skin:    General: Skin is warm and dry.  Neurological:     Mental Status: She is alert.  Psychiatric:        Behavior: Behavior normal.     ED Results / Procedures / Treatments   Labs (all labs ordered are listed, but only abnormal results are displayed) Labs Reviewed - No data to display  EKG None  Radiology CT Lumbar Spine Wo Contrast  Result Date: 04/28/2022 CLINICAL DATA:  Back pain after getting out of bed and stretching. Popping sensation EXAM: CT LUMBAR SPINE WITHOUT CONTRAST TECHNIQUE: Multidetector CT imaging of the lumbar spine was performed without intravenous contrast administration. Multiplanar CT image reconstructions were also generated. RADIATION DOSE REDUCTION: This exam was performed according to  the departmental dose-optimization program which includes automated exposure control, adjustment of the mA and/or kV according to patient size and/or use of iterative reconstruction technique. COMPARISON:  None Available. FINDINGS: Segmentation: 5 lumbar type vertebrae Alignment: Normal Vertebrae: Chronic wedging of the T12 vertebral body with mild height loss. No evidence of acute fracture or aggressive bone lesion Paraspinal and other soft tissues: Cholelithiasis and extensive atheromatous plaque. No perispinal hematoma or masslike finding. Granulomatous calcifications in the spleen. Disc levels: Diffusely preserved disc height for age. No significant facet spurring. No neural impingement. IMPRESSION: 1. No acute finding.  No visible impingement. 2. Prominent osteopenia and atherosclerosis. 3. Remote T12 compression fracture. 4. Cholelithiasis. Electronically Signed   By:  Jorje Guild M.D.   On: 04/28/2022 12:49    Procedures Procedures    Medications Ordered in ED Medications - No data to display  ED Course/ Medical Decision Making/ A&P Clinical Course as of 04/28/22 1846  Sat Apr 28, 2022  1347 Re-evaluated and discussed CT findings with patient at bedside. Pt noted nausea. Pt given Zofran previously. Provided patient with ginger ale per her request. Will re-check patient. [SB]  [facility] Notified by RN that patient is tolerating ginger ale without difficulty in the ED.  Patient appears safe for discharge at this time. [SB]  1503 Patient reevaluated and patient noted that she was able to tolerate the ginger ale however she still has nausea.  Discussed with patient that is why she received a second dose of the Zofran to aid with her symptoms.  Discussed with patient that we can send her home with medications for nausea. Pt voiced that she wouldn't want a medication for nausea at this time. Discussed with patient that I could send her a prescription for her back pain, at that time patient notes that  she would like a prescription for her back pain.  Discussed with patient that the pharmacy that she has is closed on the weekend.  Patient notes that she still would like her medications sent to the [facility] and she will pick up her medications on Monday. Pt appears safe for discharge at this time.  [SB]    Clinical Course User Index [SB] Dashawn Golda A, PA-C                           Medical Decision Making Amount and/or Complexity of Data Reviewed Radiology: ordered.  Risk Prescription drug management.   Patient with lumbar back pain without radiation onset this morning. Denies recent injury, trauma, fall. Pt afebrile. On exam, patient with lumbar spinal TTP. No tenderness to palpation noted to lumbar musculature of back.  No C, T spinal tenderness to palpation.  Negative straight leg raise bilaterally.  Sensation intact to bilateral lower extremities. No acute cardiovascular or respiratory exam findings. Differential diagnosis includes but is not limited to fracture, herniation, muscle strain, pyelonephritis, nephrolithiasis.     Imaging: I ordered imaging studies including CT lumbar spine I independently visualized and interpreted imaging which showed:  1. No acute finding.  No visible impingement.  2. Prominent osteopenia and atherosclerosis.  3. Remote T12 compression fracture.  4. Cholelithiasis.   I agree with the radiologist interpretation  Medications:  I ordered medication including Zofran and morphine for pain management Reevaluation of the patient after these medicines and interventions, I reevaluated the patient and found that they have improved I have reviewed the patients home medicines and have made adjustments as needed Able to tolerate p.o. intake prior to discharge without difficulty.    Disposition: Presentation suspicious for muscle strain of lumbar region.  Doubt acute fracture or herniation at this time.  Doubt nephrolithiasis or pyelonephritis  at this time, no CVA tenderness to palpation noted bilaterally.. After consideration of the diagnostic results and the patients response to treatment, I feel that the patient would benefit from Discharge home.  Patient sent a prescription for Lidoderm and Zofran.  Patient has refills at her pharmacy of prednisone.  Discussed with patient to pick up the prednisone refill and take as directed for her symptoms.  Also discussed with patient to follow-up with her primary care provider regarding today's ED visit.  Supportive care measures and strict return precautions discussed with patient at bedside. Pt acknowledges and verbalizes understanding. Pt appears safe for discharge. Follow up as indicated in discharge paperwork.    This chart was dictated using voice recognition software, Dragon. Despite the best efforts of this provider to proofread and correct errors, errors may still occur which can change documentation meaning.   Final Clinical Impression(s) / ED Diagnoses Final diagnoses:  Strain of lumbar region, initial encounter    Rx / DC Orders ED Discharge Orders          Ordered    lidocaine (LIDODERM) 5 %  Every 24 hours        04/28/22 1510    ondansetron (ZOFRAN) 4 MG tablet  Every 8 hours PRN        04/28/22 1510              Karyme Mcconathy A, PA-C 04/28/22 1847    Regan Lemming, MD 04/29/22 1255

## 2022-04-30 ENCOUNTER — Other Ambulatory Visit (HOSPITAL_BASED_OUTPATIENT_CLINIC_OR_DEPARTMENT_OTHER): Payer: Self-pay

## 2022-04-30 NOTE — Telephone Encounter (Signed)
Results faxed to PCP with f/u plans included. Order placed for 3 mth nodule f/u CT.

## 2022-05-01 ENCOUNTER — Ambulatory Visit: Payer: Self-pay

## 2022-05-01 NOTE — Telephone Encounter (Signed)
Pt called saying Sat am she stretched and when she did she pulled a muscle.  She went to the [facility] and they told her it was a lumbar sprain.  She is asking for something for pain.  They called in Lidocaine patches.  Her insurance would not pay for them so she bought some patches otc.  She is taking tylenol but it doesn't help much.  I told her she would probably need an appt but she declined.   Med center [facility] pharmacy is what she uses.   CB#  [phone]     Chief Complaint: Low back pain, seen at [facility] this weekend. Insurance will not pay for Lidocaine patches. Using OTC patches.Tylenol not helping. Asking for pain medication. Symptoms: Low back pain Frequency: Weekend Pertinent Negatives: Patient denies radiation of pain Disposition: '[]'$ ED /'[]'$ Urgent Care (no appt availability in office) / '[]'$ Appointment(In office/virtual)/ '[]'$  [facility]/ '[]'$ Home Care/ '[]'$ Refused Recommended Disposition /'[]'$ [facility]/ '[x]'$  Follow-up with PCP Additional Notes: Please advise pt.  Answer Assessment - Initial Assessment Questions 1. ONSET: "When did the pain begin?"      Weekend 2. LOCATION: "Where does it hurt?" (upper, mid or lower back)     Low 3. SEVERITY: "How bad is the pain?"  (e.g., Scale 1-10; mild, moderate, or severe)   - MILD (1-3): Doesn't interfere with normal activities.    - MODERATE (4-7): Interferes with normal activities or awakens from sleep.    - SEVERE (8-10): Excruciating pain, unable to do any normal activities.      8 4. PATTERN: "Is the pain constant?" (e.g., yes, no; constant, intermittent)      Constant 5. RADIATION: "Does the pain shoot into your legs or somewhere else?"     No 6. CAUSE:  "What do you think is causing the back pain?"      Sprain 7. BACK OVERUSE:  "Any recent lifting of heavy objects, strenuous work or exercise?"     No 8. MEDICINES: "What have you taken so far for the pain?" (e.g., nothing, acetaminophen, NSAIDS)      Tylenol 9. NEUROLOGIC SYMPTOMS: "Do you have any weakness, numbness, or problems with bowel/bladder control?"     No 10. OTHER SYMPTOMS: "Do you have any other symptoms?" (e.g., fever, abdomen pain, burning with urination, blood in urine)       No 11. PREGNANCY: "Is there any chance you are pregnant?" "When was your last menstrual period?"       No  Protocols used: Back Pain-A-AH

## 2022-05-02 ENCOUNTER — Telehealth: Payer: Self-pay | Admitting: Family Medicine

## 2022-05-02 NOTE — Telephone Encounter (Signed)
Pt called to see if Dr. Margarita Rana received her message from the nurse about needed pain medication for her Lumbar sprain / please advise asap

## 2022-05-03 ENCOUNTER — Other Ambulatory Visit (HOSPITAL_BASED_OUTPATIENT_CLINIC_OR_DEPARTMENT_OTHER): Payer: Self-pay

## 2022-05-03 MED ORDER — METHOCARBAMOL 500 MG PO TABS
500.0000 mg | ORAL_TABLET | Freq: Three times a day (TID) | ORAL | 0 refills | Status: DC | PRN
  Filled 2022-05-03: qty 60, 20d supply, fill #0

## 2022-05-03 NOTE — Telephone Encounter (Signed)
Prescription sent to pharmacy.

## 2022-05-03 NOTE — Addendum Note (Signed)
Addended by: [name] on: 05/03/2022 03:01 PM   Modules accepted: Orders

## 2022-05-03 NOTE — Telephone Encounter (Signed)
Duplicate message. 

## 2022-05-15 ENCOUNTER — Telehealth: Payer: Self-pay | Admitting: Pulmonary Disease

## 2022-05-15 ENCOUNTER — Ambulatory Visit: Payer: Medicare HMO | Admitting: Family Medicine

## 2022-05-15 DIAGNOSIS — R053 Chronic cough: Secondary | ICD-10-CM

## 2022-05-15 NOTE — Telephone Encounter (Signed)
Yes okay to order chest x-ray.  I think she needs to be seen in the office by either Dr. Vaughan Browner or APP to review and decide next steps.

## 2022-05-15 NOTE — Telephone Encounter (Signed)
Called and spoke with pt letting her know the info per RB and she verbalized understanding. Order for cxr has been placed and appt scheduled for pt with [facility]. Nothing further needed.

## 2022-05-15 NOTE — Telephone Encounter (Signed)
Called and spoke with pt who states that she has been coughing since September 2023 and states now when she coughs, she has pain on her right side and also in her back from coughing. Pt said that she is coughing up phlegm that is still green in color. Has complaints of wheezing at night.  Pt is still taking prednisone every day as prescribed and is also taking cephalexin abx as prescribed.  Due to no relief from meds, pt wants to know if it is okay to have a cxr performed to make sure the pna has not come back.  With Dr. Vaughan Browner not being available, routing to provider of the day. Dr. Lamonte Sakai, please advise if you are fine with [place] ordering pt a cxr for it to be done at [facility] as it is closest to where she lives.

## 2022-05-15 NOTE — Telephone Encounter (Signed)
Pt would like this order to be sent into med center as this is closest to her house

## 2022-05-18 ENCOUNTER — Ambulatory Visit (HOSPITAL_BASED_OUTPATIENT_CLINIC_OR_DEPARTMENT_OTHER)
Admission: RE | Admit: 2022-05-18 | Discharge: 2022-05-18 | Disposition: A | Discharge status: Home / Self Care | Payer: Medicare HMO | Source: Ambulatory Visit | Attending: Emergency Medicine | Admitting: Emergency Medicine

## 2022-05-18 DIAGNOSIS — R053 Chronic cough: Secondary | ICD-10-CM | POA: Insufficient documentation

## 2022-05-18 DIAGNOSIS — R059 Cough, unspecified: Secondary | ICD-10-CM | POA: Diagnosis not present

## 2022-05-23 ENCOUNTER — Encounter: Payer: Self-pay | Admitting: Nurse Practitioner

## 2022-05-23 ENCOUNTER — Ambulatory Visit (INDEPENDENT_AMBULATORY_CARE_PROVIDER_SITE_OTHER): Payer: Medicare HMO | Admitting: Nurse Practitioner

## 2022-05-23 VITALS — BP 126/60 | HR 104 | Temp 98.4°F | Ht 64.0 in | Wt 120.6 lb

## 2022-05-23 DIAGNOSIS — R911 Solitary pulmonary nodule: Secondary | ICD-10-CM

## 2022-05-23 DIAGNOSIS — J441 Chronic obstructive pulmonary disease with (acute) exacerbation: Secondary | ICD-10-CM | POA: Diagnosis not present

## 2022-05-23 DIAGNOSIS — J9611 Chronic respiratory failure with hypoxia: Secondary | ICD-10-CM

## 2022-05-23 MED ORDER — BREZTRI AEROSPHERE 160-9-4.8 MCG/ACT IN AERO: 2.0000 | INHALATION_SPRAY | Freq: Two times a day (BID) | RESPIRATORY_TRACT | 0 refills | Status: DC

## 2022-05-23 NOTE — Patient Instructions (Addendum)
Stop Breo and Spiriva. Trial Breztri 2 puffs Twice daily. Brush tongue and rinse mouth afterwards. Use with spacer Continue Albuterol inhaler 2 puffs or 3 mL neb every 6 hours as needed for shortness of breath or wheezing. Notify if symptoms persist despite rescue inhaler/neb use. Continue claritin 1 tab daily for allergies  Continue flonase nasal spray 2 sprays each nostril daily Continue azithromycin 250 mg daily. Hold while you are taking the levaquin Continue supplemental oxygen 2.5 lpm for goal oxygen >88-90%  Levaquin 500 mg daily for 7 days. Take with food. Take a daily probiotic while taking Prednisone taper. 4 tabs for 2 days, then 3 tabs for 2 days, 2 tabs for 2 days, then return to 1 tab (10 mg) daily. Take in AM with food  Mucinex 600 mg Twice daily for chest congestion Flutter valve 2-3 times a day   Labs today  Sputum culture - collect and return as directed  Follow up in 2 weeks with Dr. Vaughan Browner or Katie Beauregard Jarrells,NP. If symptoms do not improve or worsen, please contact office for sooner follow up or seek emergency care.

## 2022-05-23 NOTE — Assessment & Plan Note (Signed)
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  She is on maximal inhaler regimen, daily azithromycin and chronic prednisone; despite this, seems to be having another flare today.  She did have a chest x-ray on 8/12 which was ordered by on-call provider.  There is a hazy opacity in the left lower lobe, which is the same location as the nodule.  Question possible infectious process.  Recommended that we repeat her sputum culture.  We are going to treat her with empiric Levaquin course x7 days and prednisone taper.  Trial change to [facility]; concerned she is unable to generate the airflow for DPI with Breo and has not had good success with Trelegy or triple therapy nebs in the past.  Check CBC with differential to evaluate for leukocytosis and eosinophils to see if she would be candidate for biologic therapy.  Continue mucociliary clearance therapies.  If she does not have any improvement in her symptoms, may need to consider repeating CT chest sooner.  Patient Instructions  Stop Breo and Spiriva. Trial Breztri 2 puffs Twice daily. Brush tongue and rinse mouth afterwards. Use with spacer Continue Albuterol inhaler 2 puffs or 3 mL neb every 6 hours as needed for shortness of breath or wheezing. Notify if symptoms persist despite rescue inhaler/neb use. Continue claritin 1 tab daily for allergies  Continue flonase nasal spray 2 sprays each nostril daily Continue azithromycin 250 mg daily. Hold while you are taking the levaquin Continue supplemental oxygen 2.5 lpm for goal oxygen >88-90%  Levaquin 500 mg daily for 7 days. Take with food. Take a daily probiotic while taking Prednisone taper. 4 tabs for 2 days, then 3 tabs for 2 days, 2 tabs for 2 days, then return to 1 tab (10 mg) daily. Take in AM with food  Mucinex 600 mg Twice daily for chest congestion Flutter valve 2-3 times a day   Labs today  Sputum culture - collect and return as directed  Follow up in 2 weeks with Dr. Vaughan Browner or  Katie Dwayna Kentner,NP. If symptoms do not improve or worsen, please contact office for sooner follow up or seek emergency care.

## 2022-05-23 NOTE — Assessment & Plan Note (Signed)
There is a concerning irregular, solid nodule in the left lower lobe on recent low-dose lung cancer screening.  It measured 7.2 mm, which falls below the PET threshold.  Recommendation was for 74-monthfollow-up.  See above plan.

## 2022-05-23 NOTE — Progress Notes (Addendum)
$'@Patient'X$  ID: Margaret Henry, female    DOB: 1951-09-30, 71 y.o.   MRN: [id number]  Chief Complaint  Patient presents with   Follow-up    Patient here for results from xray.     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  She is a patient of Dr. Matilde Bash and last seen 04/02/2022.  Past medical history significant for mild aortic insufficiency, allergic rhinitis, GERD, thyroid goiter, OA, HLD, anemia.  TEST/EVENTS:  07/21/2015 PFTs: FVC 66, FEV1 39, ratio 46, DLCO 36 04/24/2022 LDCT chest: central airways are patent. Severe centrilobular emphysema. New irregular solid subpleural pulmonary nodule of the LLL measuring 7.2 mm. Lung RADS 4A. New linear consolidation of the RML. Other nodules are stable.  05/18/2022 CXR 2 view: hyperinflation, consistent with COPD. Mild atelectasis in the left lung base.   04/02/2022: OV with Dr. Vaughan Browner.  Currently maintained on Spiriva and [place].  Tried Trelegy in 2019 and 2022 but had to go back on Breo and Spiriva since it made her breathing worse.  She was also tried on Daliresp in 2019 but had to stop due to side effects.  She has been struggling with recurrent COPD exacerbations over the past few years.  She is on maximal therapy with inhalers, chronic prednisone and daily azithromycin.  She continues to have a productive cough with green mucus.  She has been treated with Levaquin and Augmentin multiple times without any improvement.  Concerned unable to generate enough airflow for the inhalers; discussed switching over to triple therapy nebs but she would like to hold off.  Has finished pulmonary rehab 2 times already.  Treated with Levaquin course for ongoing exacerbation.  Hold off on additional prednisone has no evidence of bronchospasm on exam.  Sputum culture ordered for further evaluation.  Continue flutter valve daily and  hypertonic saline to help with mucociliary clearance.  Continue low-dose CT chest for lung cancer screenings.  Palliative care visit pending.  05/23/2022: Today-acute Patient presents today with her husband for worsening shortness of breath and increased productive cough.  She has been struggling for quite some time now with recurrent exacerbations.  She has been treated with numerous different antibiotics and was eventually started on daily azithromycin and prednisone.  Her most recent antibiotic course was in June.  She did initially feel a little bit better.  However, over the past few weeks her cough has gotten more productive with green sputum and she had more chest congestion.  She also feels like her breathing is worse.  Gets winded more easily, even which is going to the bathroom.  She has noticed an occasional wheeze.  Denies any hemoptysis, anorexia, fevers, night sweats, weight loss, lower extremity swelling, orthopnea.  She is using Breo and Spiriva.  She does not feel like Memory Dance works that well for her anymore.  Feels like she cannot really get the medicine in but she does notice a difference when she does not use it.  She had been transition to triple therapy nebulizers after one of her hospitalizations.  They state that they made her extremely jittery and shaky.  She did have recent low-dose CT chest with a new irregular, solid nodule in the left lower lobe measuring 7.2 mm; lung RADS 4A.  There was also a new linear opacity in the right middle lobe.  She has follow-up CT scheduled in 3 months.  Oxygen has been  stable on 2.5 L/min.  Allergies  Allergen Reactions   Hydrocodone Nausea And Vomiting   Propoxyphene N-Acetaminophen Nausea And Vomiting    Immunization History  Administered Date(s) Administered   Fluad Quad(high Dose 65+) 07/20/2019, 07/12/2021   Influenza Split 07/07/2013, 07/05/2016   Influenza Whole 11/09/2009   Influenza,inj,Quad PF,6+ Mos 06/24/2015, 07/03/2017, 06/19/2018    Pneumococcal Conjugate-13 09/26/2015   Pneumococcal Polysaccharide-23 10/08/1998, 07/08/2018   Td 10/08/2002   Tdap 09/10/2016    Past Medical History:  Diagnosis Date   Allergy    hayfever   Bronchitis    Cancer ([place])    skin cancer on chest   Chest pain 06/12/2016   Colon polyps    Complication of anesthesia    pt states was given too much during nasal surgery 1989; difficulty getting awake   Complication of anesthesia 05/16/2021   pt states was given too much during nasal surgery 1989; difficulty getting awake   COPD (chronic obstructive pulmonary disease) ([place])    COPD exacerbation ([place]) 06/01/2013   Coronary artery calcification    Diverticulitis    Diverticulitis 05/16/2021   occasional   Dyspnea 06/27/2016   GERD (gastroesophageal reflux disease)    occasional   Hemorrhoids    High cholesterol    Hyperlipidemia LDL goal <70    Mild aortic insufficiency    Osteoporosis    Pneumonia    Shortness of breath dyspnea    with exertion    Thyroid goiter    bx benign   Tobacco abuse    Vertigo    Vertigo 04/21/2017    Tobacco History: Social History   Tobacco Use  Smoking Status Former   Packs/day: 1.50   Years: 49.00   Total pack years: 73.50   Types: Cigarettes   Quit date: 11/03/2018   Years since quitting: 3.5  Smokeless Tobacco Never   Counseling given: Not Answered   Outpatient Medications Prior to Visit  Medication Sig Dispense Refill   acetaminophen (TYLENOL) 500 MG tablet Take 1,500 mg by mouth every 4 (four) hours as needed for headache.      albuterol (PROVENTIL) (2.5 MG/3ML) 0.083% nebulizer solution Take 2.5 mg by nebulization every 6 (six) hours as needed for wheezing or shortness of breath.     atorvastatin (LIPITOR) 40 MG tablet TAKE 1 TABLET EVERY DAY (Patient taking differently: Take 40 mg by mouth daily.) 90 tablet 3   azithromycin (ZITHROMAX) 250 MG tablet Take 1 tablet (250 mg total) by mouth daily. 30 tablet 3   BREO ELLIPTA 200-25 MCG/ACT  AEPB Inhale 1 puff into the lungs daily.     ferrous sulfate 325 (65 FE) MG tablet Take 1 tablet (325 mg total) by mouth 2 (two) times daily with a meal. 100 tablet 2   fluticasone (FLONASE) 50 MCG/ACT nasal spray USE 2 SPRAYS IN EACH NOSTRIL EVERY DAY (Patient taking differently: Place 2 sprays into both nostrils daily as needed for allergies.) 16 g 6   furosemide (LASIX) 20 MG tablet Take 1 tablet (20 mg total) by mouth daily. 5 tablet 0   loratadine (CLARITIN) 10 MG tablet Take 1 tablet (10 mg total) by mouth daily. 100 tablet 1   OXYGEN Inhale 2.5 L into the lungs daily.     predniSONE (DELTASONE) 10 MG tablet Take 1 tablet (10 mg total) by mouth daily with breakfast. 30 tablet 11   Tiotropium Bromide Monohydrate (SPIRIVA RESPIMAT) 2.5 MCG/ACT AERS INHALE 2 PUFFS EVERY DAY (Patient taking differently: Take 2 each  by mouth daily.) 12 g 3   vitamin B-12 (CYANOCOBALAMIN) 1000 MCG tablet Take 1 tablet (1,000 mcg total) by mouth daily. 100 tablet 2   Vitamin D, Ergocalciferol, (DRISDOL) 1.25 MG (50000 UNIT) CAPS capsule Take 1 capsule (50,000 Units total) by mouth every 7 (seven) days. 4 capsule 2   cephALEXin (KEFLEX) 500 MG capsule Take 1 capsule (500 mg total) by mouth 2 (two) times daily. 14 capsule 0   aspirin EC (ASPIRIN ADULT LOW STRENGTH) 81 MG tablet Take 1 tablet (81 mg total) by mouth at bedtime. (Patient not taking: Reported on 04/17/2022)     ipratropium-albuterol (DUONEB) 0.5-2.5 (3) MG/3ML SOLN Take 3 mLs by nebulization every 6 (six) hours as needed. (Patient not taking: Reported on 05/23/2022) 180 mL 0   methocarbamol (ROBAXIN) 500 MG tablet Take 1 tablet (500 mg total) by mouth every 8 (eight) hours as needed for muscle spasms. (Patient not taking: Reported on 05/23/2022) 60 tablet 0   formoterol (PERFOROMIST) 20 MCG/2ML nebulizer solution Take 2 mLs (20 mcg total) by nebulization 2 (two) times daily. (Patient not taking: Reported on 05/23/2022) 120 mL 5   lidocaine (LIDODERM) 5 % Place 1  patch onto the skin daily. Remove & Discard patch within 12 hours or as directed by MD 30 patch 0   ondansetron (ZOFRAN) 4 MG tablet Take 1 tablet (4 mg total) by mouth every 8 (eight) hours as needed for nausea or vomiting. 8 tablet 0   No facility-administered medications prior to visit.     Review of Systems:   Constitutional: No weight loss or gain, night sweats, fevers, chills, or lassitude. +fatigue HEENT: No headaches, difficulty swallowing, tooth/dental problems, or sore throat. No sneezing, itching, ear ache, nasal congestion, or post nasal drip CV:  No chest pain, orthopnea, PND, swelling in lower extremities, anasarca, dizziness, palpitations, syncope Resp: +shortness of breath with exertion; wheezing; productive cough; chest congestion. No hemoptysis.  No chest wall deformity GI:  No heartburn, indigestion, abdominal pain, nausea, vomiting, diarrhea, change in bowel habits, loss of appetite, bloody stools.  GU: No dysuria, change in color of urine, urgency or frequency.  No flank pain, no hematuria  Skin: No rash, lesions, ulcerations MSK:  No joint pain or swelling.  No decreased range of motion.  No back pain. Neuro: No dizziness or lightheadedness.  Psych: No depression or anxiety. Mood stable.     Physical Exam:  BP 126/60 (BP Location: Right Arm, Patient Position: Sitting, Cuff Size: Normal)   Pulse (!) 104   Temp 98.4 F (36.9 C) (Oral)   Ht '5\' 4"'$  (1.626 m)   Wt 120 lb 9.6 oz (54.7 kg)   SpO2 96%   BMI 20.70 kg/m   GEN: Pleasant, interactive, chronically-ill appearing; in no acute distress HEENT:  Normocephalic and atraumatic. PERRLA. Sclera white. Nasal turbinates pink, moist and patent bilaterally. No rhinorrhea present. Oropharynx pink and moist, without exudate or edema. No lesions, ulcerations, or postnasal drip.  NECK:  Supple w/ fair ROM. No JVD present. Normal carotid impulses w/o bruits. Thyroid symmetrical with no goiter or nodules palpated. No  lymphadenopathy.   CV: RRR, no m/r/g, no peripheral edema. Pulses intact, +2 bilaterally. No cyanosis, pallor or clubbing. PULMONARY:  Unlabored, regular breathing. Diminished with minimal scattered rhonchi bilaterally A&P. Congested cough. No accessory muscle use. No dullness to percussion. GI: BS present and normoactive. Soft, non-tender to palpation. No organomegaly or masses detected. No CVA tenderness. MSK: No erythema, warmth or tenderness. Cap refil <2  sec all extrem. No deformities or joint swelling noted. Muscle wasting Neuro: A/Ox3. No focal deficits noted.   Skin: Warm, no lesions or rashe Psych: Normal affect and behavior. Judgement and thought content appropriate.     Lab Results:  CBC    Component Value Date/Time   WBC 9.9 04/17/2022 1552   WBC 10.3 04/12/2022 0655   RBC 4.58 04/17/2022 1552   RBC 3.83 (L) 04/12/2022 0655   HGB 11.5 04/17/2022 1552   HCT 38.7 04/17/2022 1552   PLT 423 04/17/2022 1552   MCV 85 04/17/2022 1552   MCH 25.1 (L) 04/17/2022 1552   MCH 24.8 (L) 04/12/2022 0655   MCHC 29.7 (L) 04/17/2022 1552   MCHC 29.5 (L) 04/12/2022 0655   RDW 25.6 (H) 04/17/2022 1552   LYMPHSABS 0.7 04/17/2022 1552   MONOABS 0.3 08/13/2021 1623   EOSABS 0.0 04/17/2022 1552   BASOSABS 0.1 04/17/2022 1552    BMET    Component Value Date/Time   NA 143 04/12/2022 0655   NA 140 11/18/2020 1201   K 4.1 04/12/2022 0655   CL 108 04/12/2022 0655   CO2 30 04/12/2022 0655   GLUCOSE 90 04/12/2022 0655   BUN 10 04/12/2022 0655   BUN 9 11/18/2020 1201   CREATININE 0.52 04/12/2022 0655   CREATININE 0.60 09/05/2016 1218   CALCIUM 8.9 04/12/2022 0655   GFRNONAA >60 04/12/2022 0655   GFRNONAA >89 03/26/2016 1022   GFRAA 109 11/18/2020 1201   GFRAA >89 03/26/2016 1022    BNP    Component Value Date/Time   BNP 9.9 06/13/2021 1241     Imaging:  DG Chest 2 View  Result Date: 05/19/2022 CLINICAL DATA:  Productive cough.  History of pneumonia. EXAM: CHEST - 2 VIEW  COMPARISON:  April 06, 2022 FINDINGS: Mild haziness in the left base is likely atelectasis. Flattening of the diaphragms and hyperinflation of the lungs identified. The heart, hila, mediastinum, lungs, and pleura are otherwise unremarkable. Calcified atherosclerotic changes are identified in the thoracic aorta. IMPRESSION: 1. Hyperinflation of the lungs suggesting COPD or emphysema. Mild atelectasis in the lateral left lung base. No other acute abnormalities. Electronically Signed   By: Dorise Bullion III M.D.   On: 05/19/2022 18:06   CT Lumbar Spine Wo Contrast  Result Date: 04/28/2022 CLINICAL DATA:  Back pain after getting out of bed and stretching. Popping sensation EXAM: CT LUMBAR SPINE WITHOUT CONTRAST TECHNIQUE: Multidetector CT imaging of the lumbar spine was performed without intravenous contrast administration. Multiplanar CT image reconstructions were also generated. RADIATION DOSE REDUCTION: This exam was performed according to the departmental dose-optimization program which includes automated exposure control, adjustment of the mA and/or kV according to patient size and/or use of iterative reconstruction technique. COMPARISON:  None Available. FINDINGS: Segmentation: 5 lumbar type vertebrae Alignment: Normal Vertebrae: Chronic wedging of the T12 vertebral body with mild height loss. No evidence of acute fracture or aggressive bone lesion Paraspinal and other soft tissues: Cholelithiasis and extensive atheromatous plaque. No perispinal hematoma or masslike finding. Granulomatous calcifications in the spleen. Disc levels: Diffusely preserved disc height for age. No significant facet spurring. No neural impingement. IMPRESSION: 1. No acute finding.  No visible impingement. 2. Prominent osteopenia and atherosclerosis. 3. Remote T12 compression fracture. 4. Cholelithiasis. Electronically Signed   By: Jorje Guild M.D.   On: 04/28/2022 12:49   CT CHEST LUNG CA SCREEN LOW DOSE W/O CM  Result Date:  04/25/2022 CLINICAL DATA:  Former smoker with 50 pack-year history EXAM: CT  CHEST WITHOUT CONTRAST LOW-DOSE FOR LUNG CANCER SCREENING TECHNIQUE: Multidetector CT imaging of the chest was performed following the standard protocol without IV contrast. RADIATION DOSE REDUCTION: This exam was performed according to the departmental dose-optimization program which includes automated exposure control, adjustment of the mA and/or kV according to patient size and/or use of iterative reconstruction technique. COMPARISON:  CT chest angio dated April 24, 2021 lung cancer screening CT dated October 20, 2020 FINDINGS: Cardiovascular: Normal heart size. No pericardial effusion. Severe three-vessel coronary artery calcifications. Normal caliber thoracic aorta with severe atherosclerotic disease. Mediastinum/Nodes: Esophagus is unremarkable. Unchanged calcified nodule of the right thyroid lobe. No pathologically enlarged lymph nodes seen in the chest. Lungs/Pleura: Central airways are patent. Severe centrilobular emphysema. No consolidation, pleural effusion or pneumothorax.New irregular solid subpleural pulmonary nodule of the left lower lobe measuring 7.2 mm in mean diameter. New linear consolidation of the right middle lobe, likely due to atelectasis or scarring. Other previously seen nodules are stable or resolved, reference solid pulmonary nodule of the right upper lobe measuring 3.4 mm on image 79. Upper Abdomen: Gallstones.  No acute abnormality. Musculoskeletal: No chest wall mass or suspicious bone lesions identified. IMPRESSION: 1. New irregular solid subpleural pulmonary nodule of the left lower lobe measuring 7.2 mm. Lung-RADS 4A, suspicious. Follow up low-dose chest CT without contrast in 3 months (please use the following order, CT CHEST LCS NODULE FOLLOW-UP W/O CM) is recommended. Alternatively, PET may be considered when there is a solid component 16m or larger. 2. New linear opacity of the right middle lobe, likely  due to atelectasis or scarring. Recommend attention on follow-up. 3. Severe three-vessel coronary artery calcifications. 4. Aortic Atherosclerosis (ICD10-I70.0) and Emphysema (ICD10-J43.9). These results will be called to the ordering clinician or representative by the Radiologist Assistant, and communication documented in the PACS or [employer] Electronically Signed   By: LYetta GlassmanM.D.   On: 04/25/2022 17:29         Latest Ref Rng & Units 07/21/2015    9:33 AM  PFT Results  FVC-Pre L 2.06   FVC-Predicted Pre % 66   FVC-Post L 2.23   FVC-Predicted Post % 72   Pre FEV1/FVC % % 45   Post FEV1/FCV % % 46   FEV1-Pre L 0.93   FEV1-Predicted Pre % 39   FEV1-Post L 1.03   DLCO uncorrected ml/min/mmHg 8.56   DLCO [facility]% % 36   DLVA Predicted % 39     No results found for: "NITRICOXIDE"      Assessment & Plan:   COPD with acute exacerbation (HCC) She has very severe COPD and is prone to frequent exacerbations.  She is on maximal inhaler regimen, daily azithromycin and chronic prednisone; despite this, seems to be having another flare today.  She did have a chest x-ray on 8/12 which was ordered by on-call provider.  There is a hazy opacity in the left lower lobe, which is the same location as the nodule.  Question possible infectious process.  Recommended that we repeat her sputum culture.  We are going to treat her with empiric Levaquin course x7 days and prednisone taper.  [facility] she is unable to generate the airflow for DPI with Breo and has not had good success with Trelegy or triple therapy nebs in the past.  Check CBC with differential to evaluate for leukocytosis and eosinophils to see if she would be candidate for biologic therapy.  Continue mucociliary clearance therapies.  If she  does not have any improvement in her symptoms, may need to consider repeating CT chest sooner.  Patient Instructions  Stop Breo and Spiriva. Trial Breztri 2 puffs  Twice daily. Brush tongue and rinse mouth afterwards. Use with spacer Continue Albuterol inhaler 2 puffs or 3 mL neb every 6 hours as needed for shortness of breath or wheezing. Notify if symptoms persist despite rescue inhaler/neb use. Continue claritin 1 tab daily for allergies  Continue flonase nasal spray 2 sprays each nostril daily Continue azithromycin 250 mg daily. Hold while you are taking the levaquin Continue supplemental oxygen 2.5 lpm for goal oxygen >88-90%  Levaquin 500 mg daily for 7 days. Take with food. Take a daily probiotic while taking Prednisone taper. 4 tabs for 2 days, then 3 tabs for 2 days, 2 tabs for 2 days, then return to 1 tab (10 mg) daily. Take in AM with food  Mucinex 600 mg Twice daily for chest congestion Flutter valve 2-3 times a day   Labs today  Sputum culture - collect and return as directed  Follow up in 2 weeks with Dr. Vaughan Browner or Katie Sue Fernicola,NP. If symptoms do not improve or worsen, please contact office for sooner follow up or seek emergency care.     Chronic respiratory failure (HCC) Stable without any increased O2 demand.  Continue 2.5 L/min for goal greater than 88 to 90%.  Lung nodule There is a concerning irregular, solid nodule in the left lower lobe on recent low-dose lung cancer screening.  It measured 7.2 mm, which falls below the PET threshold.  Recommendation was for 36-monthfollow-up.  See above plan.   I spent 42 minutes of dedicated to the care of this patient on the date of this encounter to include pre-visit review of records, face-to-face time with the patient discussing conditions above, post visit ordering of testing, clinical documentation with the electronic health record, making appropriate referrals as documented, and communicating necessary findings to members of the patients care team.  KClayton Bibles NP 05/23/2022  Pt aware and understands NP's role.

## 2022-05-23 NOTE — Assessment & Plan Note (Signed)
Stable without any increased O2 demand.  Continue 2.5 L/min for goal greater than 88 to 90%.

## 2022-05-24 ENCOUNTER — Encounter: Payer: Self-pay | Admitting: Nurse Practitioner

## 2022-05-24 ENCOUNTER — Telehealth: Payer: Self-pay | Admitting: Nurse Practitioner

## 2022-05-24 ENCOUNTER — Other Ambulatory Visit (HOSPITAL_BASED_OUTPATIENT_CLINIC_OR_DEPARTMENT_OTHER): Payer: Self-pay

## 2022-05-24 LAB — CBC WITH DIFFERENTIAL/PLATELET
Basophils Absolute: 0.1 10*3/uL (ref 0.0–0.1)
Basophils Relative: 0.9 % (ref 0.0–3.0)
Eosinophils Absolute: 0.3 10*3/uL (ref 0.0–0.7)
Eosinophils Relative: 3.8 % (ref 0.0–5.0)
HCT: 36.9 % (ref 36.0–46.0)
Hemoglobin: 12.2 g/dL (ref 12.0–15.0)
Lymphocytes Relative: 23.9 % (ref 12.0–46.0)
Lymphs Abs: 1.7 10*3/uL (ref 0.7–4.0)
MCHC: 33 g/dL (ref 30.0–36.0)
MCV: 88.6 fl (ref 78.0–100.0)
Monocytes Absolute: 0.9 10*3/uL (ref 0.1–1.0)
Monocytes Relative: 12.3 % — ABNORMAL HIGH (ref 3.0–12.0)
Neutro Abs: 4.1 10*3/uL (ref 1.4–7.7)
Neutrophils Relative %: 59.1 % (ref 43.0–77.0)
Platelets: 321 10*3/uL (ref 150.0–400.0)
RBC: 4.17 Mil/uL (ref 3.87–5.11)
RDW: 26.4 % — ABNORMAL HIGH (ref 11.5–15.5)
WBC: 7 10*3/uL (ref 4.0–10.5)

## 2022-05-24 MED ORDER — LEVOFLOXACIN 500 MG PO TABS
500.0000 mg | ORAL_TABLET | Freq: Every day | ORAL | 0 refills | Status: DC
  Filled 2022-05-24: qty 7, 7d supply, fill #0

## 2022-05-24 MED ORDER — PREDNISONE 10 MG PO TABS
ORAL_TABLET | ORAL | 0 refills | Status: AC
  Filled 2022-05-24: qty 20, 6d supply, fill #0

## 2022-05-24 NOTE — Telephone Encounter (Signed)
Entered medication orders per AVS for prednisone and Levaquin. Notified pt medications were called in and pt stated understanding. Nothing further needed at this time.

## 2022-05-24 NOTE — Addendum Note (Signed)
Addended by: Gavin Potters R on: 05/24/2022 04:18 PM   Modules accepted: Orders

## 2022-05-25 ENCOUNTER — Ambulatory Visit (INDEPENDENT_AMBULATORY_CARE_PROVIDER_SITE_OTHER): Payer: Medicare HMO | Admitting: Cardiology

## 2022-05-25 ENCOUNTER — Encounter: Payer: Self-pay | Admitting: Cardiology

## 2022-05-25 VITALS — BP 138/54 | HR 110 | Ht 64.0 in | Wt 118.0 lb

## 2022-05-25 DIAGNOSIS — I2510 Atherosclerotic heart disease of native coronary artery without angina pectoris: Secondary | ICD-10-CM

## 2022-05-25 DIAGNOSIS — J441 Chronic obstructive pulmonary disease with (acute) exacerbation: Secondary | ICD-10-CM

## 2022-05-25 DIAGNOSIS — I351 Nonrheumatic aortic (valve) insufficiency: Secondary | ICD-10-CM

## 2022-05-25 DIAGNOSIS — E782 Mixed hyperlipidemia: Secondary | ICD-10-CM

## 2022-05-25 NOTE — Progress Notes (Unsigned)
Cardiology Office Note:    Date:  05/25/2022   ID:  Margaret Henry, DOB 11/13/1950, MRN [id number]  PCP:  [name], MD  Cardiologist:  Jenne Campus, MD    Referring MD: [name], MD   No chief complaint on file. I am feeling better  History of Present Illness:    Margaret Henry is a 71 y.o. female with past medical history significant for advanced COPD she is oxygen and steroid-dependent, she has follow-up follow-up excellently by pulmonary, history of coronary calcification initially diagnosed in 2019, shortly after that she had a stress test which showed no evidence of ischemia.  She comes today to my office for follow-up.  Since the time I seen her last time she had a being in the hospital severe anemia, her hemoglobin went down to 6 she was transfused 3 units also iron in different vitamins has been supplemented and she started doing well denies have any cardiac complaints.  There is no chest pain tightness squeezing pressure burning chest.  Shortness of breath is still there which is probably related to her COPD.  Past Medical History:  Diagnosis Date   Allergy    hayfever   Bronchitis    Cancer ([place])    skin cancer on chest   Chest pain 06/12/2016   Colon polyps    Complication of anesthesia    pt states was given too much during nasal surgery 1989; difficulty getting awake   Complication of anesthesia 05/16/2021   pt states was given too much during nasal surgery 1989; difficulty getting awake   COPD (chronic obstructive pulmonary disease) (HCC)    COPD exacerbation ([place]) 06/01/2013   Coronary artery calcification    Diverticulitis    Diverticulitis 05/16/2021   occasional   Dyspnea 06/27/2016   GERD (gastroesophageal reflux disease)    occasional   Hemorrhoids    High cholesterol    Hyperlipidemia LDL goal <70    Mild aortic insufficiency    Osteoporosis    Pneumonia    Shortness of breath dyspnea    with exertion    Thyroid goiter    bx benign    Tobacco abuse    Vertigo    Vertigo 04/21/2017    Past Surgical History:  Procedure Laterality Date   ABDOMINAL HYSTERECTOMY     BILATERAL OOPHORECTOMY  2001   for benign ovarian mass    BIOPSY THYROID     DENTAL SURGERY     dentures   NASAL SEPTUM SURGERY     PARTIAL HYSTERECTOMY  1976   for heavy menses    RECTAL EXAM UNDER ANESTHESIA N/A 12/14/2015   Procedure: RECTAL EXAM UNDER ANESTHESIA REMOVAL OF ANAL CANAL MASS; INTERNAL HEMORRHOID LIGATION, EXTERNAL HEMORRHOID LIGATION;  Surgeon: Michael Boston, MD;  Location: [facility];  Service: General;  Laterality: N/A;   TUBAL LIGATION     WISDOM TOOTH EXTRACTION      Current Medications: Current Meds  Medication Sig   acetaminophen (TYLENOL) 500 MG tablet Take 1,500 mg by mouth every 4 (four) hours as needed for headache.    albuterol (PROVENTIL) (2.5 MG/3ML) 0.083% nebulizer solution Take 2.5 mg by nebulization every 6 (six) hours as needed for wheezing or shortness of breath.   aspirin EC (ASPIRIN ADULT LOW STRENGTH) 81 MG tablet Take 1 tablet (81 mg total) by mouth at bedtime. (Patient taking differently: Take 81 mg by mouth every other day.)   atorvastatin (LIPITOR) 40 MG tablet TAKE 1 TABLET EVERY DAY (  Patient taking differently: Take 40 mg by mouth daily.)   azithromycin (ZITHROMAX) 250 MG tablet Take 1 tablet (250 mg total) by mouth daily.   BREO ELLIPTA 200-25 MCG/ACT AEPB Inhale 1 puff into the lungs daily.   Budeson-Glycopyrrol-Formoterol (BREZTRI AEROSPHERE) 160-9-4.8 MCG/ACT AERO Inhale 2 puffs into the lungs in the morning and at bedtime.   ferrous sulfate 325 (65 FE) MG tablet Take 1 tablet (325 mg total) by mouth 2 (two) times daily with a meal.   fluticasone (FLONASE) 50 MCG/ACT nasal spray USE 2 SPRAYS IN EACH NOSTRIL EVERY DAY (Patient taking differently: Place 2 sprays into both nostrils daily as needed for allergies.)   ipratropium-albuterol (DUONEB) 0.5-2.5 (3) MG/3ML SOLN Take 3 mLs by nebulization every 6 (six) hours as  needed.   levofloxacin (LEVAQUIN) 500 MG tablet Take 1 tablet (500 mg total) by mouth daily.   loratadine (CLARITIN) 10 MG tablet Take 1 tablet (10 mg total) by mouth daily.   OXYGEN Inhale 3 L into the lungs daily.   predniSONE (DELTASONE) 10 MG tablet Take 1 tablet (10 mg total) by mouth daily with breakfast.   predniSONE (DELTASONE) 10 MG tablet Take 4 tablets (40 mg total) by mouth daily with breakfast for 2 days, THEN 3 tablets (30 mg total) daily with breakfast for 2 days, THEN 2 tablets (20 mg total) daily with breakfast for 2 days.   Tiotropium Bromide Monohydrate (SPIRIVA RESPIMAT) 2.5 MCG/ACT AERS INHALE 2 PUFFS EVERY DAY (Patient taking differently: Take 2 each by mouth daily.)   vitamin B-12 (CYANOCOBALAMIN) 1000 MCG tablet Take 1 tablet (1,000 mcg total) by mouth daily.   Vitamin D, Ergocalciferol, (DRISDOL) 1.25 MG (50000 UNIT) CAPS capsule Take 1 capsule (50,000 Units total) by mouth every 7 (seven) days.     Allergies:   Propoxyphene, Hydrocodone, and Propoxyphene n-acetaminophen   Social History   Socioeconomic History   Marital status: Widowed    Spouse name: Not on file   Number of children: Not on file   Years of education: Not on file   Highest education level: Not on file  Occupational History   Not on file  Tobacco Use   Smoking status: Former    Packs/day: 1.50    Years: 49.00    Total pack years: 73.50    Types: Cigarettes    Quit date: 11/03/2018    Years since quitting: 3.5   Smokeless tobacco: Never  Vaping Use   Vaping Use: Never used  Substance and Sexual Activity   Alcohol use: No    Alcohol/week: 0.0 standard drinks of alcohol   Drug use: No   Sexual activity: Not on file  Other Topics Concern   Not on file  Social History Narrative   Not on file   Social Determinants of Health   Financial Resource Strain: Not on file  Food Insecurity: Not on file  Transportation Needs: Not on file  Physical Activity: Not on file  Stress: Not on file   Social Connections: Not on file     Family History: The patient's family history includes COPD in her father; Heart disease in her mother; Liver cancer in her paternal grandmother; Lung cancer in her maternal grandfather. There is no history of Colon cancer, Colon polyps, Esophageal cancer, Rectal cancer, or Stomach cancer. ROS:   Please see the history of present illness.    All 14 point review of systems negative except as described per history of present illness  EKGs/Labs/Other Studies Reviewed:  Recent Labs: 06/13/2021: B Natriuretic Peptide 9.9 04/07/2022: ALT 12 04/12/2022: BUN 10; Creatinine, Ser 0.52; Magnesium 2.4; Potassium 4.1; Sodium 143 05/23/2022: Hemoglobin 12.2; Platelets 321.0  Recent Lipid Panel    Component Value Date/Time   CHOL 143 01/30/2022 1158   CHOL 147 02/15/2021 1139   TRIG 132.0 01/30/2022 1158   HDL 74.30 01/30/2022 1158   HDL 58 02/15/2021 1139   CHOLHDL 2 01/30/2022 1158   VLDL 26.4 01/30/2022 1158   LDLCALC 42 01/30/2022 1158   LDLCALC 70 02/15/2021 1139   LDLDIRECT 166 (H) 09/26/2015 1001    Physical Exam:    VS:  BP (!) 138/54 (BP Location: Right Arm, Patient Position: Sitting, Cuff Size: Normal)   Pulse (!) 110   Ht '5\' 4"'$  (1.626 m)   Wt 118 lb (53.5 kg)   SpO2 98% Comment: 3 liters [place]  BMI 20.25 kg/m     Wt Readings from Last 3 Encounters:  05/25/22 118 lb (53.5 kg)  05/23/22 120 lb 9.6 oz (54.7 kg)  04/17/22 121 lb (54.9 kg)     GEN:  Well nourished, well developed in no acute distress HEENT: Normal NECK: No JVD; No carotid bruits LYMPHATICS: No lymphadenopathy CARDIAC: RRR, no murmurs, no rubs, no gallops RESPIRATORY: Poor air to bilaterally ABDOMEN: Soft, non-tender, non-distended MUSCULOSKELETAL:  No edema; No deformity  SKIN: Warm and dry LOWER EXTREMITIES: no swelling NEUROLOGIC:  Alert and oriented x 3 PSYCHIATRIC:  Normal affect   ASSESSMENT:    1. Coronary arteriosclerosis   2. COPD with acute exacerbation  ([place])   3. Mild aortic insufficiency   4. Mixed hyperlipidemia    PLAN:    In order of problems listed above:  Coronary artery disease she does have calcification of the coronary arteries stress test done few years ago negative, on top of that she survive her hemoglobin going down to 6 without any cardiac consequences.  I do not think we dealing with any critical obstructive coronary artery disease, therefore, risk factors modification will be continued. COPD follow-up excellently by pulmonary. Mild aortic insufficiency she will require echocardiogram in about a year Dyslipidemia I did review her K PN which show me her LDL of 42 and HDL 74 this is from April 25 of this year.  She is on high intense statin form of Lipitor 40 that I will continue.   Medication Adjustments/Labs and Tests Ordered: Current medicines are reviewed at length with the patient today.  Concerns regarding medicines are outlined above.  No orders of the defined types were placed in this encounter.  Medication changes: No orders of the defined types were placed in this encounter.   Signed, Park Liter, MD, [facility] 05/25/2022 11:21 AM    [place]

## 2022-05-25 NOTE — Patient Instructions (Signed)

## 2022-05-28 ENCOUNTER — Emergency Department (HOSPITAL_COMMUNITY): Payer: Medicare HMO

## 2022-05-28 ENCOUNTER — Other Ambulatory Visit (HOSPITAL_BASED_OUTPATIENT_CLINIC_OR_DEPARTMENT_OTHER): Payer: Self-pay

## 2022-05-28 ENCOUNTER — Other Ambulatory Visit: Payer: Self-pay

## 2022-05-28 ENCOUNTER — Emergency Department (HOSPITAL_COMMUNITY)
Admission: EM | Admit: 2022-05-28 | Discharge: 2022-05-28 | Disposition: A | Discharge status: Home / Self Care | Payer: Medicare HMO | Attending: Emergency Medicine | Admitting: Emergency Medicine

## 2022-05-28 DIAGNOSIS — R000 Tachycardia, unspecified: Secondary | ICD-10-CM | POA: Diagnosis not present

## 2022-05-28 DIAGNOSIS — I10 Essential (primary) hypertension: Secondary | ICD-10-CM | POA: Diagnosis not present

## 2022-05-28 DIAGNOSIS — Z20822 Contact with and (suspected) exposure to covid-19: Secondary | ICD-10-CM | POA: Insufficient documentation

## 2022-05-28 DIAGNOSIS — R0602 Shortness of breath: Secondary | ICD-10-CM | POA: Diagnosis not present

## 2022-05-28 DIAGNOSIS — J441 Chronic obstructive pulmonary disease with (acute) exacerbation: Secondary | ICD-10-CM | POA: Insufficient documentation

## 2022-05-28 DIAGNOSIS — Z7982 Long term (current) use of aspirin: Secondary | ICD-10-CM | POA: Insufficient documentation

## 2022-05-28 DIAGNOSIS — Z7951 Long term (current) use of inhaled steroids: Secondary | ICD-10-CM | POA: Insufficient documentation

## 2022-05-28 DIAGNOSIS — R079 Chest pain, unspecified: Secondary | ICD-10-CM | POA: Diagnosis not present

## 2022-05-28 DIAGNOSIS — R059 Cough, unspecified: Secondary | ICD-10-CM | POA: Diagnosis not present

## 2022-05-28 DIAGNOSIS — M419 Scoliosis, unspecified: Secondary | ICD-10-CM | POA: Diagnosis not present

## 2022-05-28 LAB — TROPONIN I (HIGH SENSITIVITY)
Troponin I (High Sensitivity): 4 ng/L (ref <18)
Troponin I (High Sensitivity): 6 ng/L (ref <18)

## 2022-05-28 LAB — CBC
HCT: 41.3 % (ref 36.0–46.0)
Hemoglobin: 13.1 g/dL (ref 12.0–15.0)
MCH: 29.2 pg (ref 26.0–34.0)
MCHC: 31.7 g/dL (ref 30.0–36.0)
MCV: 92 fL (ref 80.0–100.0)
Platelets: 328 10*3/uL (ref 150–400)
RBC: 4.49 MIL/uL (ref 3.87–5.11)
RDW: 21.5 % — ABNORMAL HIGH (ref 11.5–15.5)
WBC: 9.7 10*3/uL (ref 4.0–10.5)
nRBC: 0 % (ref 0.0–0.2)

## 2022-05-28 LAB — BASIC METABOLIC PANEL
Anion gap: 8 (ref 5–15)
BUN: 9 mg/dL (ref 8–23)
CO2: 26 mmol/L (ref 22–32)
Calcium: 9.3 mg/dL (ref 8.9–10.3)
Chloride: 108 mmol/L (ref 98–111)
Creatinine, Ser: 0.62 mg/dL (ref 0.44–1.00)
GFR, Estimated: >60 mL/min (ref >60)
Glucose, Bld: 111 mg/dL — ABNORMAL HIGH (ref 70–99)
Potassium: 3.5 mmol/L (ref 3.5–5.1)
Sodium: 142 mmol/L (ref 135–145)

## 2022-05-28 LAB — SARS CORONAVIRUS 2 BY RT PCR: SARS Coronavirus 2 by RT PCR: NEGATIVE

## 2022-05-28 MED ORDER — IPRATROPIUM-ALBUTEROL 0.5-2.5 (3) MG/3ML IN SOLN
3.0000 mL | Freq: Once | RESPIRATORY_TRACT | Status: AC
  Administered 2022-05-28: 3 mL via RESPIRATORY_TRACT
  Filled 2022-05-28: qty 3

## 2022-05-28 MED ORDER — AZITHROMYCIN 250 MG PO TABS
500.0000 mg | ORAL_TABLET | Freq: Once | ORAL | Status: AC
  Administered 2022-05-28: 500 mg via ORAL
  Filled 2022-05-28: qty 2

## 2022-05-28 MED ORDER — AZITHROMYCIN 250 MG PO TABS
250.0000 mg | ORAL_TABLET | Freq: Every day | ORAL | 3 refills | Status: DC
  Filled 2022-05-28: qty 30, 25d supply, fill #0
  Filled 2022-06-27: qty 30, 25d supply, fill #1
  Filled 2022-07-26: qty 30, 25d supply, fill #2

## 2022-05-28 MED ORDER — METHYLPREDNISOLONE SODIUM SUCC 125 MG IJ SOLR
125.0000 mg | Freq: Once | INTRAMUSCULAR | Status: AC
  Administered 2022-05-28: 125 mg via INTRAVENOUS
  Filled 2022-05-28: qty 2

## 2022-05-28 MED ORDER — IBUPROFEN 200 MG PO TABS
400.0000 mg | ORAL_TABLET | Freq: Once | ORAL | Status: DC
  Filled 2022-05-28: qty 2

## 2022-05-28 MED ORDER — ACETAMINOPHEN 500 MG PO TABS
1000.0000 mg | ORAL_TABLET | Freq: Once | ORAL | Status: DC
  Filled 2022-05-28: qty 2

## 2022-05-28 MED ORDER — GUAIFENESIN 100 MG/5ML PO LIQD
5.0000 mL | Freq: Once | ORAL | Status: AC
  Administered 2022-05-28: 5 mL via ORAL
  Filled 2022-05-28: qty 10

## 2022-05-28 NOTE — ED Notes (Addendum)
Patient refused Ibuprofen, stating that the medication is too difficult to swallow.

## 2022-05-28 NOTE — ED Triage Notes (Signed)
Pt. BIB [facility] from home. Pt. Has been c/o increased SOB an a non-productive cough. Per EMS Pt. C/o a pulled muscle feeling after coughing and increased pain with movent. Pt. regularly wears 3 L of [place] @ home . Pt. Has a Hx of COPD. Per EMS Pt. Has recent chest scan that showed lung nodules.

## 2022-05-28 NOTE — Discharge Instructions (Addendum)
You were evaluated in the emergency department for shortness of breath and chest pain.  Based on the results of your evaluation, I think a COPD exacerbation is causing your symptoms.  It does not appear as though you are having a heart attack.  It does not look like you have pneumonia.  Your COVID test was negative.  Please follow-up with your pulmonologist as soon as possible.  Start taking two 250 mg azithromycin tablets daily for 5 days.  Then, resume taking 1 tablet daily.  Start taking four 10 mg prednisone tablets daily for 5 days.  Then, resume taking 1 tablet daily.  Please return to the emergency department for signs and symptoms of worsening illness including, but not limited to: New or worsening shortness of breath, cough with bloody sputum, new or worsening chest pain, confusion, dizziness, fainting, fevers greater than 100.4 F.

## 2022-05-28 NOTE — ED Provider Notes (Signed)
[facility] DEPT Provider Note   CSN: [id number] Arrival date & time: 05/28/22  6269     History  Chief Complaint  Patient presents with   Shortness of Breath    Margaret Henry is a 71 y.o. female with a history of severe COPD who presents with acutely worsened dyspnea and chest pain that started around 5 AM this morning.  Chest pain is described as a sharp, left-sided pain that is worse with deep breathing, coughing, walking.  Started suddenly this morning after coughing fit.  Patient's dyspnea has also worsened acutely this morning, but has been steadily worsening since September of last year, which she attributes to frequent bouts of pneumonia.  She also has a cough productive of scant green sputum.  She used her inhalers this morning.  She received a breathing treatment with EMS.  She denies fevers, chills, cold sweats, nausea, vomiting, abdominal pain.  Medical history: COPD on 3 L at home   Shortness of Breath      Home Medications Prior to Admission medications   Medication Sig Start Date End Date Taking? Authorizing Provider  acetaminophen (TYLENOL) 500 MG tablet Take 1,500 mg by mouth every 4 (four) hours as needed for headache.     [provider]  albuterol (PROVENTIL) (2.5 MG/3ML) 0.083% nebulizer solution Take 2.5 mg by nebulization every 6 (six) hours as needed for wheezing or shortness of breath.    [provider]  aspirin EC (ASPIRIN ADULT LOW STRENGTH) 81 MG tablet Take 1 tablet (81 mg total) by mouth at bedtime. Patient taking differently: Take 81 mg by mouth every other day. 04/26/22   [place] Margaret J, DO  atorvastatin (LIPITOR) 40 MG tablet TAKE 1 TABLET EVERY DAY Patient taking differently: Take 40 mg by mouth daily. 12/15/21   Park Liter, MD  azithromycin (ZITHROMAX) 250 MG tablet Take 2 tablets by mouth daily for 5 days. Then, resume taking 1 tablet daily. 05/28/22   [name], MD  BREO ELLIPTA  200-25 MCG/ACT AEPB Inhale 1 puff into the lungs daily. 04/05/22   [provider]  Budeson-Glycopyrrol-Formoterol (BREZTRI AEROSPHERE) 160-9-4.8 MCG/ACT AERO Inhale 2 puffs into the lungs in the morning and at bedtime. 05/23/22   Cobb, [name], NP  ferrous sulfate 325 (65 FE) MG tablet Take 1 tablet (325 mg total) by mouth 2 (two) times daily with a meal. 04/12/22 07/11/22  [place] Margaret J, DO  fluticasone (FLONASE) 50 MCG/ACT nasal spray USE 2 SPRAYS IN EACH NOSTRIL EVERY DAY Patient taking differently: Place 2 sprays into both nostrils daily as needed for allergies. 02/11/20   [name], MD  furosemide (LASIX) 20 MG tablet Take 1 tablet (20 mg total) by mouth daily. Patient not taking: Reported on 05/25/2022 04/03/22   [name], MD  ipratropium-albuterol (DUONEB) 0.5-2.5 (3) MG/3ML SOLN Take 3 mLs by nebulization every 6 (six) hours as needed. 03/13/22   [name], MD  levofloxacin (LEVAQUIN) 500 MG tablet Take 1 tablet (500 mg total) by mouth daily. 05/24/22   Cobb, [name], NP  loratadine (CLARITIN) 10 MG tablet Take 1 tablet (10 mg total) by mouth daily. 04/17/22   [name], MD  OXYGEN Inhale 3 L into the lungs daily.    [provider]  predniSONE (DELTASONE) 10 MG tablet Take 1 tablet (10 mg total) by mouth daily with breakfast. 04/04/22   Mannam, Praveen, MD  predniSONE (DELTASONE) 10 MG tablet Take 4 tablets (40 mg total) by mouth  daily with breakfast for 2 days, THEN 3 tablets (30 mg total) daily with breakfast for 2 days, THEN 2 tablets (20 mg total) daily with breakfast for 2 days. 05/24/22 05/31/22  Cobb, [name], NP  Tiotropium Bromide Monohydrate (SPIRIVA RESPIMAT) 2.5 MCG/ACT AERS INHALE 2 PUFFS EVERY DAY Patient taking differently: Take 2 each by mouth daily. 04/05/22   Mannam, Hart Robinsons, MD  vitamin B-12 (CYANOCOBALAMIN) 1000 MCG tablet Take 1 tablet (1,000 mcg total) by mouth daily. 04/12/22 07/21/22  [place] Margaret J, DO  Vitamin D,  Ergocalciferol, (DRISDOL) 1.25 MG (50000 UNIT) CAPS capsule Take 1 capsule (50,000 Units total) by mouth every 7 (seven) days. 04/15/22 07/14/22  [place] Margaret J, DO      Allergies    Propoxyphene, Hydrocodone, and Propoxyphene n-acetaminophen    Review of Systems   Review of Systems  Respiratory:  Positive for shortness of breath.     Physical Exam Updated Vital Signs BP (!) 163/69   Pulse (!) 110   Temp 97.9 F (36.6 C)   Resp 18   Ht '5\' 4"'$  (1.626 m)   Wt 53.5 kg   SpO2 95%   BMI 20.25 kg/m  Physical Exam Vitals and nursing note reviewed.  Constitutional:      General: She is in acute distress.     Appearance: She is well-developed.  HENT:     Head: Normocephalic and atraumatic.  Eyes:     Conjunctiva/sclera: Conjunctivae normal.  Cardiovascular:     Rate and Rhythm: Normal rate and regular rhythm.     Heart sounds: No murmur heard. Pulmonary:     Effort: Tachypnea present. No respiratory distress.     Breath sounds: Decreased breath sounds present.  Chest:     Chest wall: Tenderness present.  Abdominal:     Palpations: Abdomen is soft.     Tenderness: There is no abdominal tenderness.  Musculoskeletal:        General: No swelling.     Cervical back: Neck supple.  Skin:    General: Skin is warm and dry.     Capillary Refill: Capillary refill takes less than 2 seconds.  Neurological:     Mental Status: She is alert.  Psychiatric:        Mood and Affect: Mood normal.     ED Results / Procedures / Treatments   Labs (all labs ordered are listed, but only abnormal results are displayed) Labs Reviewed  BASIC METABOLIC PANEL - Abnormal; Notable for the following components:      Result Value   Glucose, Bld 111 (*)    All other components within normal limits  CBC - Abnormal; Notable for the following components:   RDW 21.5 (*)    All other components within normal limits  SARS CORONAVIRUS 2 BY RT PCR  TROPONIN I (HIGH SENSITIVITY)  TROPONIN I (HIGH  SENSITIVITY)    EKG None  Radiology DG Chest Port 1 View  Result Date: 05/28/2022 CLINICAL DATA:  Chest pain/SOB EXAM: PORTABLE CHEST 1 VIEW COMPARISON:  chest x-ray 08/27/2022. FINDINGS: Chronic hyperinflation. No consolidation. No visible pleural effusions or pneumothorax. Cardiomediastinal silhouette is within normal limits. Similar mild scoliosis. IMPRESSION: Chronic hyperinflation, suggestive of emphysema. No evidence of acute cardiopulmonary disease. Electronically Signed   By: Margaretha Sheffield M.D.   On: 05/28/2022 10:31    Procedures Procedures  Normal sinus rhythm and sinus tachycardia  Medications Ordered in ED Medications  acetaminophen (TYLENOL) tablet 1,000 mg (1,000 mg Oral Not Given 05/28/22  1400)  ibuprofen (ADVIL) tablet 400 mg (400 mg Oral Not Given 05/28/22 1603)  ipratropium-albuterol (DUONEB) 0.5-2.5 (3) MG/3ML nebulizer solution 3 mL (3 mLs Nebulization Given 05/28/22 1051)  methylPREDNISolone sodium succinate (SOLU-MEDROL) 125 mg/2 mL injection 125 mg (125 mg Intravenous Given 05/28/22 1351)  azithromycin (ZITHROMAX) tablet 500 mg (500 mg Oral Given 05/28/22 1349)  guaiFENesin (ROBITUSSIN) 100 MG/5ML liquid 5 mL (5 mLs Oral Given 05/28/22 1412)    ED Course/ Medical Decision Making/ A&P Clinical Course as of 05/28/22 1652  Mon May 28, 2022  1034 DG Chest [place] 1 View CXR shows hyperinflated lungs. No new infiltrate or effusion to explain the patient's current clinical syndrome. [MM]  1129 Troponin I (High Sensitivity): 4 [MM]  1323 EKG 12-Lead Sinus tachycardia. No evidence of ischemia. [MM]  1323 Cardiac workup is reassuring. Don't suspect ACS given nature of chest pain, negative troponin, non-ischemic EKG. Will treat as COPD exacerbation with IV solumedrol and azithromycin. [MM]  1436 SARS Coronavirus 2 by RT PCR: NEGATIVE [MM]  1436 Troponin I (High Sensitivity): 6 [MM]    Clinical Course User Index [MM] [name], MD                            Medical Decision Making Amount and/or Complexity of Data Reviewed Labs: ordered. Decision-making details documented in ED Course. Radiology: ordered. Decision-making details documented in ED Course. ECG/medicine tests:  Decision-making details documented in ED Course.  Risk OTC drugs. Prescription drug management.   This patient is a 71 year old female with a history of severe COPD, nonobstructing coronary artery disease, who presents with acutely worsened shortness of breath and new onset chest pain.  Chest pain started after coughing fit.  It is pleuritic.  On exam she has a tender chest.  Appears dyspneic on 4 L.  Lungs without significant wheezing or crackles.  Ordered chest x-ray, troponin, basic labs.  We will start DuoNeb.  Suspect this is likely secondary to COPD exacerbation.  Less likely MI given nonobstructing CAD and atypical symptoms.  Less likely PE as patient's signs and symptoms are more consistent with COPD exacerbation.  Do not suspect aortic dissection at this time.  Co morbidities that complicate the patient evaluation  COPD CAD   Social Determinants of Health:  Lives at home alone   Additional history obtained:  Additional history and/or information obtained from chart review External records from outside source obtained and reviewed including charts from prior encounters   Lab Tests:  I Ordered (or co-signed), and personally interpreted labs.  The pertinent results include:   COVID negative BMP and CBC WNL Troponin negative x2   Imaging Studies ordered:  I ordered (or co-signed) imaging studies including CXR  I independently visualized and interpreted imaging which showed hyperinflated lungs but nothing consistent with a new or worsening pneumonia. I agree with the radiologist interpretation   Cardiac Monitoring:  The patient was maintained on a cardiac monitor.  The cardiac monitored showed an rhythm of NSR v sinus tachycardia. The patient was  also maintained on pulse oximetry. The readings were typically > 94%.   Medicines ordered and prescription drug management:  I ordered medication including guaifenesin, Solu-Medrol, azithromycin, DuoNeb Reevaluation of the patient after these medicines showed that the patient improved  Reevaluation:  After the interventions noted above, I reevaluated the patient and found that they have :improved.    Dispostion:  After consideration of the diagnostic results and the patients response  to treatment, I feel that the patent would benefit from discharge home with 40 mg prednisone daily, 500 mg azithromycin daily, instructions to follow-up with pulmonology.          Final Clinical Impression(s) / ED Diagnoses Final diagnoses:  COPD exacerbation [facility])    Rx / DC Orders ED Discharge Orders          Ordered    azithromycin (ZITHROMAX) 250 MG tablet  Daily        05/28/22 1538              [name], MD 05/28/22 1653    Carmin Muskrat, MD 06/04/22 1704

## 2022-05-29 ENCOUNTER — Ambulatory Visit: Payer: Medicare HMO | Admitting: Adult Health

## 2022-05-29 ENCOUNTER — Other Ambulatory Visit (HOSPITAL_BASED_OUTPATIENT_CLINIC_OR_DEPARTMENT_OTHER): Payer: Self-pay

## 2022-05-29 DIAGNOSIS — J441 Chronic obstructive pulmonary disease with (acute) exacerbation: Secondary | ICD-10-CM | POA: Diagnosis not present

## 2022-05-30 ENCOUNTER — Other Ambulatory Visit (HOSPITAL_BASED_OUTPATIENT_CLINIC_OR_DEPARTMENT_OTHER): Payer: Self-pay

## 2022-05-30 ENCOUNTER — Other Ambulatory Visit: Payer: Medicare HMO

## 2022-05-30 DIAGNOSIS — J441 Chronic obstructive pulmonary disease with (acute) exacerbation: Secondary | ICD-10-CM

## 2022-05-31 ENCOUNTER — Other Ambulatory Visit (HOSPITAL_BASED_OUTPATIENT_CLINIC_OR_DEPARTMENT_OTHER): Payer: Self-pay

## 2022-05-31 ENCOUNTER — Other Ambulatory Visit: Payer: Self-pay | Admitting: Nurse Practitioner

## 2022-05-31 ENCOUNTER — Telehealth: Payer: Self-pay | Admitting: Nurse Practitioner

## 2022-05-31 ENCOUNTER — Ambulatory Visit (INDEPENDENT_AMBULATORY_CARE_PROVIDER_SITE_OTHER): Payer: Medicare HMO | Admitting: Nurse Practitioner

## 2022-05-31 ENCOUNTER — Encounter: Payer: Self-pay | Admitting: Nurse Practitioner

## 2022-05-31 VITALS — BP 136/60 | HR 105 | Temp 98.4°F | Ht 64.0 in | Wt 118.2 lb

## 2022-05-31 DIAGNOSIS — J9600 Acute respiratory failure, unspecified whether with hypoxia or hypercapnia: Secondary | ICD-10-CM | POA: Diagnosis not present

## 2022-05-31 DIAGNOSIS — J9611 Chronic respiratory failure with hypoxia: Secondary | ICD-10-CM | POA: Diagnosis not present

## 2022-05-31 DIAGNOSIS — J441 Chronic obstructive pulmonary disease with (acute) exacerbation: Secondary | ICD-10-CM | POA: Diagnosis not present

## 2022-05-31 DIAGNOSIS — R0602 Shortness of breath: Secondary | ICD-10-CM

## 2022-05-31 DIAGNOSIS — J3489 Other specified disorders of nose and nasal sinuses: Secondary | ICD-10-CM | POA: Diagnosis not present

## 2022-05-31 DIAGNOSIS — R911 Solitary pulmonary nodule: Secondary | ICD-10-CM | POA: Diagnosis not present

## 2022-05-31 DIAGNOSIS — R918 Other nonspecific abnormal finding of lung field: Secondary | ICD-10-CM

## 2022-05-31 LAB — D-DIMER, QUANTITATIVE: D-Dimer, Quant: 0.69 mcg/mL FEU — ABNORMAL HIGH (ref <0.50)

## 2022-05-31 MED ORDER — BENZONATATE 200 MG PO CAPS
200.0000 mg | ORAL_CAPSULE | Freq: Three times a day (TID) | ORAL | 1 refills | Status: DC | PRN
  Filled 2022-05-31: qty 30, 10d supply, fill #0

## 2022-05-31 MED ORDER — MUPIROCIN 2 % EX OINT
1.0000 | TOPICAL_OINTMENT | Freq: Two times a day (BID) | CUTANEOUS | 0 refills | Status: DC
  Filled 2022-05-31: qty 22, 11d supply, fill #0

## 2022-05-31 NOTE — Addendum Note (Signed)
Addended by: Clayton Bibles on: 05/31/2022 12:02 PM   Modules accepted: Orders

## 2022-05-31 NOTE — Assessment & Plan Note (Signed)
Increased to 3 lpm after ED visit. Stable on this. Advised to monitor oxygen for goal >88-90%.

## 2022-05-31 NOTE — Progress Notes (Signed)
$'@Patient'K$  ID: Margaret Henry, female    DOB: June 05, 1951, 71 y.o.   MRN: [id number]  Chief Complaint  Patient presents with   Follow-up    Pt states she is still having SOB. Using 3L's of Oxygen at home.    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  She is a patient of Dr. Matilde Henry and last seen 05/23/2022 by Margaret Cruise NP.  Past medical history significant for mild aortic insufficiency, allergic rhinitis, GERD, thyroid goiter, OA, HLD, anemia.  TEST/EVENTS:  07/21/2015 PFTs: FVC 66, FEV1 39, ratio 46, DLCO 36 04/24/2022 LDCT chest: central airways are patent. Severe centrilobular emphysema. New irregular solid subpleural pulmonary nodule of the LLL measuring 7.2 mm. Lung RADS 4A. New linear consolidation of the RML. Other nodules are stable.  05/18/2022 CXR 2 view: hyperinflation, consistent with COPD. Mild atelectasis in the left lung base.   04/02/2022: OV with Dr. Vaughan Henry.  Currently maintained on Spiriva and [place].  Tried Trelegy in 2019 and 2022 but had to go back on Breo and Spiriva since it made her breathing worse.  She was also tried on Daliresp in 2019 but had to stop due to side effects.  She has been struggling with recurrent COPD exacerbations over the past few years.  She is on maximal therapy with inhalers, chronic prednisone and daily azithromycin.  She continues to have a productive cough with green mucus.  She has been treated with Levaquin and Augmentin multiple times without any improvement.  Concerned unable to generate enough airflow for the inhalers; discussed switching over to triple therapy nebs but she would like to hold off.  Has finished pulmonary rehab 2 times already.  Treated with Levaquin course for ongoing exacerbation.  Hold off on additional prednisone has no evidence of bronchospasm on exam.  Sputum culture ordered for further evaluation.   Continue flutter valve daily and hypertonic saline to help with mucociliary clearance.  Continue low-dose CT chest for lung cancer screenings.  Palliative care visit pending.  05/23/2022: OV with Margaret Jaffer NP for worsening shortness of breath and increased productive cough.  She has been struggling for quite some time now with recurrent exacerbations.  She has been treated with numerous different antibiotics and was eventually started on daily azithromycin and prednisone.  Her most recent antibiotic course was in June.  She did initially feel a little bit better.  However, over the past few weeks her cough has gotten more productive with green sputum and she had more chest congestion.  She also feels like her breathing is worse.  Gets winded more easily, even which is going to the bathroom.  She has noticed an occasional wheeze.  Denies any hemoptysis, anorexia, fevers, night sweats, weight loss, lower extremity swelling, orthopnea.  She is using Breo and Spiriva.  She does not feel like Memory Dance works that well for her anymore.  Feels like she cannot really get the medicine in but she does notice a difference when she does not use it.  She had been transition to triple therapy nebulizers after one of her hospitalizations.  They state that they made her extremely jittery and shaky.  She did have recent low-dose CT chest with a new irregular, solid nodule in the left lower lobe measuring 7.2 mm; lung RADS 4A.  There was also a new linear opacity in the right middle lobe.  She has follow-up CT scheduled in  3 months.  Oxygen has been stable on 2.5 L/min. Treated for AECOPD with levaquin x 7 days and prednisone taper. Trial change to [employer]. CBC without leukocytosis; eosinophils 300. Mucociliary clearance therapies advised. Sputum culture ordered  05/31/2022: Today - follow up Patient presents today for follow up with her husband.  After she was seen here last, she called EMS on Monday 8/21 and then went to the ED due to  chest pain and increased shortness of breath.  She underwent work-up which was negative for ACS.  Suspected that her chest pain was pleuritic in nature.  They obtained CXR which was stable.  No work-up for PE was completed.  They treated her with nebulizer and steroid injection; she had some improvement and was discharged home on increased prednisone and increased azithromycin (500 mg) for 5 days.  Today, she reports that she is still having increased shortness of breath from her baseline.  She is also still having chest discomfort, usually in the middle of her chest , and worse when she coughs.  Cough is relatively unchanged and still with green sputum production. Levaquin didn't seem to impact this. She did collect the sputum culture previously ordered but they returned it at the end of the day and it sat in the fridge overnight, so not sure this will yield accurate results. She does feel little bit better compared to when she was in the ED. Denies any hemoptysis, calf pain/swelling, orthopnea, fevers, chills, anorexia. She stopped the [place]; made her jittery. She is back on Breo and Spiriva. Using her neb three times a day. She is taking mucinex twice a day and using her flutter valve. She's currently on 40 mg of prednisone, which she has two days left of. She's on azithromycin 500 mg daily; plan to go back to 250 mg in 2 days. They did increase her oxygen after her ED visit to 3 lpm. She has maintained in the 90's on this.   Allergies  Allergen Reactions   Propoxyphene Nausea And Vomiting   Hydrocodone Nausea And Vomiting   Propoxyphene N-Acetaminophen Nausea And Vomiting    Immunization History  Administered Date(s) Administered   Fluad Quad(high Dose 65+) 07/20/2019, 07/12/2021   Influenza Split 07/07/2013, 07/05/2016   Influenza Whole 11/09/2009   Influenza,inj,Quad PF,6+ Mos 06/24/2015, 07/03/2017, 06/19/2018   Pneumococcal Conjugate-13 09/26/2015   Pneumococcal Polysaccharide-23  10/08/1998, 07/08/2018   Td 10/08/2002   Tdap 09/10/2016    Past Medical History:  Diagnosis Date   Allergy    hayfever   Bronchitis    Cancer ([place])    skin cancer on chest   Chest pain 06/12/2016   Colon polyps    Complication of anesthesia    pt states was given too much during nasal surgery 1989; difficulty getting awake   Complication of anesthesia 05/16/2021   pt states was given too much during nasal surgery 1989; difficulty getting awake   COPD (chronic obstructive pulmonary disease) ([place])    COPD exacerbation ([place]) 06/01/2013   Coronary artery calcification    Diverticulitis    Diverticulitis 05/16/2021   occasional   Dyspnea 06/27/2016   GERD (gastroesophageal reflux disease)    occasional   Hemorrhoids    High cholesterol    Hyperlipidemia LDL goal <70    Mild aortic insufficiency    Osteoporosis    Pneumonia    Shortness of breath dyspnea    with exertion    Thyroid goiter    bx benign   Tobacco abuse  Vertigo    Vertigo 04/21/2017    Tobacco History: Social History   Tobacco Use  Smoking Status Former   Packs/day: 1.50   Years: 49.00   Total pack years: 73.50   Types: Cigarettes   Quit date: 11/03/2018   Years since quitting: 3.5  Smokeless Tobacco Never   Counseling given: Not Answered   Outpatient Medications Prior to Visit  Medication Sig Dispense Refill   acetaminophen (TYLENOL) 500 MG tablet Take 1,500 mg by mouth every 4 (four) hours as needed for headache.      albuterol (PROVENTIL) (2.5 MG/3ML) 0.083% nebulizer solution Take 2.5 mg by nebulization every 6 (six) hours as needed for wheezing or shortness of breath.     aspirin EC (ASPIRIN ADULT LOW STRENGTH) 81 MG tablet Take 1 tablet (81 mg total) by mouth at bedtime. (Patient taking differently: Take 81 mg by mouth every other day.)     atorvastatin (LIPITOR) 40 MG tablet TAKE 1 TABLET EVERY DAY (Patient taking differently: Take 40 mg by mouth daily.) 90 tablet 3   azithromycin (ZITHROMAX)  250 MG tablet Take 2 tablets by mouth daily for 5 days. Then, resume taking 1 tablet daily. 30 tablet 3   BREO ELLIPTA 200-25 MCG/ACT AEPB Inhale 1 puff into the lungs daily.     ferrous sulfate 325 (65 FE) MG tablet Take 1 tablet (325 mg total) by mouth 2 (two) times daily with a meal. 100 tablet 2   fluticasone (FLONASE) 50 MCG/ACT nasal spray USE 2 SPRAYS IN EACH NOSTRIL EVERY DAY (Patient taking differently: Place 2 sprays into both nostrils daily as needed for allergies.) 16 g 6   furosemide (LASIX) 20 MG tablet Take 1 tablet (20 mg total) by mouth daily. 5 tablet 0   ipratropium-albuterol (DUONEB) 0.5-2.5 (3) MG/3ML SOLN Take 3 mLs by nebulization every 6 (six) hours as needed. 180 mL 0   loratadine (CLARITIN) 10 MG tablet Take 1 tablet (10 mg total) by mouth daily. 100 tablet 1   OXYGEN Inhale 3 L into the lungs daily.     predniSONE (DELTASONE) 10 MG tablet Take 1 tablet (10 mg total) by mouth daily with breakfast. 30 tablet 11   predniSONE (DELTASONE) 10 MG tablet Take 4 tablets (40 mg total) by mouth daily with breakfast for 2 days, THEN 3 tablets (30 mg total) daily with breakfast for 2 days, THEN 2 tablets (20 mg total) daily with breakfast for 2 days. 20 tablet 0   Tiotropium Bromide Monohydrate (SPIRIVA RESPIMAT) 2.5 MCG/ACT AERS INHALE 2 PUFFS EVERY DAY (Patient taking differently: Take 2 each by mouth daily.) 12 g 3   vitamin B-12 (CYANOCOBALAMIN) 1000 MCG tablet Take 1 tablet (1,000 mcg total) by mouth daily. 100 tablet 2   Vitamin D, Ergocalciferol, (DRISDOL) 1.25 MG (50000 UNIT) CAPS capsule Take 1 capsule (50,000 Units total) by mouth every 7 (seven) days. 4 capsule 2   Budeson-Glycopyrrol-Formoterol (BREZTRI AEROSPHERE) 160-9-4.8 MCG/ACT AERO Inhale 2 puffs into the lungs in the morning and at bedtime. 10.7 g 0   levofloxacin (LEVAQUIN) 500 MG tablet Take 1 tablet (500 mg total) by mouth daily. 7 tablet 0   No facility-administered medications prior to visit.     Review of  Systems:   Constitutional: No weight loss or gain, night sweats, fevers, chills, or lassitude. +fatigue HEENT: No headaches, difficulty swallowing, tooth/dental problems, or sore throat. No sneezing, itching, ear ache, nasal congestion, or post nasal drip CV:  No chest pain, orthopnea, PND, swelling  in lower extremities, anasarca, dizziness, palpitations, syncope Resp: +shortness of breath with exertion; wheezing; productive cough; chest congestion; pleuritic pain. No hemoptysis.  No chest wall deformity GI:  No heartburn, indigestion, abdominal pain, nausea, vomiting, diarrhea, change in bowel habits, loss of appetite, bloody stools.  GU: No dysuria, change in color of urine, urgency or frequency.  No flank pain, no hematuria  Skin: No rash, lesions, ulcerations MSK:  No joint pain or swelling.  No decreased range of motion.  No back pain. Neuro: No dizziness or lightheadedness.  Psych: No depression or anxiety. Mood stable.     Physical Exam:  BP 136/60 (BP Location: Right Arm, Patient Position: Sitting, Cuff Size: Normal)   Pulse (!) 105   Temp 98.4 F (36.9 C) (Oral)   Ht '5\' 4"'$  (1.626 m)   Wt 118 lb 3.2 oz (53.6 kg)   SpO2 94%   BMI 20.29 kg/m   GEN: Pleasant, interactive, chronically-ill appearing; in no acute distress HEENT:  Normocephalic and atraumatic. PERRLA. Sclera white. Nasal turbinates pink, moist and patent bilaterally. No rhinorrhea present. Oropharynx pink and moist, without exudate or edema. No lesions, ulcerations, or postnasal drip.  NECK:  Supple w/ fair ROM. No JVD present. Normal carotid impulses w/o bruits. Thyroid symmetrical with no goiter or nodules palpated. No lymphadenopathy.   CV: RRR, no m/r/g, no peripheral edema. Pulses intact, +2 bilaterally. No cyanosis, pallor or clubbing. PULMONARY:  Unlabored, regular breathing. Diminished bilaterally A&P w/o wheezes/rales/rhonchi. Congested cough. No accessory muscle use. No dullness to percussion. GI: BS  present and normoactive. Soft, non-tender to palpation. No organomegaly or masses detected. No CVA tenderness. MSK: No erythema, warmth or tenderness. Cap refil <2 sec all extrem. No deformities or joint swelling noted. Muscle wasting Neuro: A/Ox3. No focal deficits noted.   Skin: Warm, no lesions or rashe Psych: Normal affect and behavior. Judgement and thought content appropriate.     Lab Results:  CBC    Component Value Date/Time   WBC 9.7 05/28/2022 1032   RBC 4.49 05/28/2022 1032   HGB 13.1 05/28/2022 1032   HGB 11.5 04/17/2022 1552   HCT 41.3 05/28/2022 1032   HCT 38.7 04/17/2022 1552   PLT 328 05/28/2022 1032   PLT 423 04/17/2022 1552   MCV 92.0 05/28/2022 1032   MCV 85 04/17/2022 1552   MCH 29.2 05/28/2022 1032   MCHC 31.7 05/28/2022 1032   RDW 21.5 (H) 05/28/2022 1032   RDW 25.6 (H) 04/17/2022 1552   LYMPHSABS 1.7 05/23/2022 1657   LYMPHSABS 0.7 04/17/2022 1552   MONOABS 0.9 05/23/2022 1657   EOSABS 0.3 05/23/2022 1657   EOSABS 0.0 04/17/2022 1552   BASOSABS 0.1 05/23/2022 1657   BASOSABS 0.1 04/17/2022 1552    BMET    Component Value Date/Time   NA 142 05/28/2022 1032   NA 140 11/18/2020 1201   K 3.5 05/28/2022 1032   CL 108 05/28/2022 1032   CO2 26 05/28/2022 1032   GLUCOSE 111 (H) 05/28/2022 1032   BUN 9 05/28/2022 1032   BUN 9 11/18/2020 1201   CREATININE 0.62 05/28/2022 1032   CREATININE 0.60 09/05/2016 1218   CALCIUM 9.3 05/28/2022 1032   GFRNONAA >60 05/28/2022 1032   GFRNONAA >89 03/26/2016 1022   GFRAA 109 11/18/2020 1201   GFRAA >89 03/26/2016 1022    BNP    Component Value Date/Time   BNP 9.9 06/13/2021 1241     Imaging:  DG Chest Port 1 View  Result Date: 05/28/2022 CLINICAL  DATA:  Chest pain/SOB EXAM: PORTABLE CHEST 1 VIEW COMPARISON:  chest x-ray 08/27/2022. FINDINGS: Chronic hyperinflation. No consolidation. No visible pleural effusions or pneumothorax. Cardiomediastinal silhouette is within normal limits. Similar mild  scoliosis. IMPRESSION: Chronic hyperinflation, suggestive of emphysema. No evidence of acute cardiopulmonary disease. Electronically Signed   By: Margaretha Sheffield M.D.   On: 05/28/2022 10:31   DG Chest 2 View  Result Date: 05/19/2022 CLINICAL DATA:  Productive cough.  History of pneumonia. EXAM: CHEST - 2 VIEW COMPARISON:  April 06, 2022 FINDINGS: Mild haziness in the left base is likely atelectasis. Flattening of the diaphragms and hyperinflation of the lungs identified. The heart, hila, mediastinum, lungs, and pleura are otherwise unremarkable. Calcified atherosclerotic changes are identified in the thoracic aorta. IMPRESSION: 1. Hyperinflation of the lungs suggesting COPD or emphysema. Mild atelectasis in the lateral left lung base. No other acute abnormalities. Electronically Signed   By: Dorise Bullion III M.D.   On: 05/19/2022 18:06         Latest Ref Rng & Units 07/21/2015    9:33 AM  PFT Results  FVC-Pre L 2.06   FVC-Predicted Pre % 66   FVC-Post L 2.23   FVC-Predicted Post % 72   Pre FEV1/FVC % % 45   Post FEV1/FCV % % 46   FEV1-Pre L 0.93   FEV1-Predicted Pre % 39   FEV1-Post L 1.03   DLCO uncorrected ml/min/mmHg 8.56   DLCO UNC% % 36   DLVA Predicted % 39     No results found for: "NITRICOXIDE"      Assessment & Plan:   COPD with acute exacerbation ([place]) She has very severe COPD with high symptom burden and prone to recurrent exacerbation. Slow to resolve exacerbation. She has been treated with levaquin course, which she completed, and 5 day course of 500 mg azithromycin. She is on extended prednisone taper. She has had very slow improvement since her ED visit on 8/21. No evidence of superimposed infection on repeat imaging. Unable to tolerate Breztri. Currently on triple therapy with Breo and Spiriva. Continue mucociliary clearance therapies. Still with purulent sputum production; unsure if previous sputum culture will yield appropriate results given timeline. We will  repeat culture today. Pleuritic pain with coughing spells; having difficulty sleeping due to this - provided with tessalon perles PRN. Close follow up and strict ED precautions.  Patient Instructions  Continue Breo 1 puff daily. Brush tongue and rinse mouth afterwards Continue Spiriva 2 puffs daily  Continue Albuterol inhaler 2 puffs or 3 mL neb every 6 hours as needed for shortness of breath or wheezing. Notify if symptoms persist despite rescue inhaler/neb use. Use your neb treatments at least twice daily until symptoms improve  Continue claritin 1 tab daily for allergies  Continue flonase nasal spray 2 sprays each nostril daily Continue azithromycin 500 mg for two more days then return to 250 mg daily Continue supplemental oxygen 3 lpm for goal oxygen >88-90% Continue Flutter valve 2-3 times a day  Continue robitussin as directed   Continue prednisone 40 mg for 3 days then decrease to 30 mg for 3 days then 20 mg for 3 days then return to you 10 mg daily   Tessalon perles 1 capsule Three times a day as needed for severe cough   Labs today   Follow up in 1 week with Dr. Vaughan Henry or Katie Suetta Hoffmeister,NP. If symptoms do not improve or worsen, please contact office for sooner follow up or seek emergency care.  Chronic respiratory failure ([facility]) Increased to 3 lpm after ED visit. Stable on this. Advised to monitor oxygen for goal >88-90%.   Shortness of breath Persistent shortness of breath despite steroids and abx. She appears to be slowly improving but still has increased dyspnea from baseline therefore has been more immobile, now with pleuritic pain and mild tachycardia. She also required increased from 2.5 lpm to 3 lpm O2. Given this, there is a moderate probability of PE. We will obtain d dimer and if positive, move forward with CTA chest.   Lung nodule There is a concerning irregular, solid nodule in the left lower lobe on recent low-dose lung cancer screening from July 2023.  It measured  7.2 mm, which falls below the PET threshold.  Recommendation was for 81-monthfollow-up.  If she does not improve, we will repeat imaging sooner.     I spent 42 minutes of dedicated to the care of this patient on the date of this encounter to include pre-visit review of records, face-to-face time with the patient discussing conditions above, post visit ordering of testing, clinical documentation with the electronic health record, making appropriate referrals as documented, and communicating necessary findings to members of the patients care team.  KClayton Bibles NP 05/31/2022  Pt aware and understands NP's role.

## 2022-05-31 NOTE — Patient Instructions (Addendum)
Continue Breo 1 puff daily. Brush tongue and rinse mouth afterwards Continue Spiriva 2 puffs daily  Continue Albuterol inhaler 2 puffs or 3 mL neb every 6 hours as needed for shortness of breath or wheezing. Notify if symptoms persist despite rescue inhaler/neb use. Use your neb treatments at least twice daily until symptoms improve  Continue claritin 1 tab daily for allergies  Continue flonase nasal spray 2 sprays each nostril daily Continue azithromycin 500 mg for two more days then return to 250 mg daily Continue supplemental oxygen 3 lpm for goal oxygen >88-90% Continue Flutter valve 2-3 times a day  Continue robitussin as directed   Continue prednisone 40 mg for 3 days then decrease to 30 mg for 3 days then 20 mg for 3 days then return to you 10 mg daily   Tessalon perles 1 capsule Three times a day as needed for severe cough   Labs today   Follow up in 1 week with Dr. Vaughan Browner or Katie Eriyonna Matsushita,NP. If symptoms do not improve or worsen, please contact office for sooner follow up or seek emergency care.

## 2022-05-31 NOTE — Telephone Encounter (Signed)
Please advise on name of nose cream mentioned at [facility]

## 2022-05-31 NOTE — Assessment & Plan Note (Signed)
Persistent shortness of breath despite steroids and abx. She appears to be slowly improving but still has increased dyspnea from baseline therefore has been more immobile, now with pleuritic pain and mild tachycardia. She also required increased from 2.5 lpm to 3 lpm O2. Given this, there is a moderate probability of PE. We will obtain d dimer and if positive, move forward with CTA chest.

## 2022-05-31 NOTE — Progress Notes (Signed)
Please notify patient d dimer positive. I have ordered STAT CTA chest to rule out blood clot in her lung. Thanks.

## 2022-05-31 NOTE — Assessment & Plan Note (Addendum)
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 Slow to resolve exacerbation. She has been treated with levaquin course, which she completed, and 5 day course of 500 mg azithromycin. She is on extended prednisone taper. She has had very slow improvement since her ED visit on 8/21. No evidence of superimposed infection on repeat imaging. Unable to tolerate Breztri. Currently on triple therapy with Breo and Spiriva. Continue mucociliary clearance therapies. Still with purulent sputum production; unsure if previous sputum culture will yield appropriate results given timeline. We will repeat culture today. Pleuritic pain with coughing spells; having difficulty sleeping due to this - provided with tessalon perles PRN. Close follow up and strict ED precautions.  Patient Instructions  Continue Breo 1 puff daily. Brush tongue and rinse mouth afterwards Continue Spiriva 2 puffs daily  Continue Albuterol inhaler 2 puffs or 3 mL neb every 6 hours as needed for shortness of breath or wheezing. Notify if symptoms persist despite rescue inhaler/neb use. Use your neb treatments at least twice daily until symptoms improve  Continue claritin 1 tab daily for allergies  Continue flonase nasal spray 2 sprays each nostril daily Continue azithromycin 500 mg for two more days then return to 250 mg daily Continue supplemental oxygen 3 lpm for goal oxygen >88-90% Continue Flutter valve 2-3 times a day  Continue robitussin as directed   Continue prednisone 40 mg for 3 days then decrease to 30 mg for 3 days then 20 mg for 3 days then return to you 10 mg daily   Tessalon perles 1 capsule Three times a day as needed for severe cough   Labs today   Follow up in 1 week with Dr. Vaughan Browner or Katie Javaria Knapke,NP. If symptoms do not improve or worsen, please contact office for sooner follow up or seek emergency care.

## 2022-05-31 NOTE — Assessment & Plan Note (Signed)
There is a concerning irregular, solid nodule in the left lower lobe on recent low-dose lung cancer screening from July 2023.  It measured 7.2 mm, which falls below the PET threshold.  Recommendation was for 53-monthfollow-up.  If she does not improve, we will repeat imaging sooner.

## 2022-06-01 ENCOUNTER — Encounter (HOSPITAL_BASED_OUTPATIENT_CLINIC_OR_DEPARTMENT_OTHER): Payer: Self-pay

## 2022-06-01 ENCOUNTER — Ambulatory Visit (HOSPITAL_BASED_OUTPATIENT_CLINIC_OR_DEPARTMENT_OTHER)
Admission: RE | Admit: 2022-06-01 | Discharge: 2022-06-01 | Disposition: A | Discharge status: Home / Self Care | Payer: Medicare HMO | Source: Ambulatory Visit | Attending: Nurse Practitioner | Admitting: Nurse Practitioner

## 2022-06-01 DIAGNOSIS — R0602 Shortness of breath: Secondary | ICD-10-CM | POA: Diagnosis not present

## 2022-06-01 DIAGNOSIS — R079 Chest pain, unspecified: Secondary | ICD-10-CM | POA: Diagnosis not present

## 2022-06-01 DIAGNOSIS — J439 Emphysema, unspecified: Secondary | ICD-10-CM | POA: Diagnosis not present

## 2022-06-01 MED ORDER — IOHEXOL 350 MG/ML SOLN
100.0000 mL | Freq: Once | INTRAVENOUS | Status: AC | PRN
  Administered 2022-06-01: 75 mL via INTRAVENOUS

## 2022-06-01 NOTE — Progress Notes (Signed)
Notified patient CTA chest negative for PE and acute process. She does have a rib fracture of the left third rib, which would correlate with her chest discomfort. Recommended supportive care measures. She will still need to complete 3 month f/u LDCT chest for the suspicious lung nodule on previous imaging. This is scheduled for October.

## 2022-06-02 LAB — RESPIRATORY CULTURE OR RESPIRATORY AND SPUTUM CULTURE
MICRO NUMBER:: 13820395
SPECIMEN QUALITY:: ADEQUATE

## 2022-06-04 ENCOUNTER — Telehealth: Payer: Self-pay

## 2022-06-04 ENCOUNTER — Telehealth: Payer: Self-pay | Admitting: *Deleted

## 2022-06-04 NOTE — Telephone Encounter (Signed)
     Patient  visit [facility]  on  05/30/2022 was for SOB  Have you been able to follow up with your primary care physician? Patient has seen a pulmonary doctor and is feeling better  The patient was  able  to obtain any needed medicine or equipment.  Are there diet recommendations that you are having difficulty following?  Patient expresses understanding of discharge instructions and education provided has no other needs at this time.    [facility] [phone] [address] , [place] [address] Email : [email or web address]

## 2022-06-04 NOTE — Telephone Encounter (Signed)
(  12:59 pm ) SW scheduled a follow-up visit with patient post hospitalization. Patient is scheduled to visit with patient on 06/07/22 @ 11:30 am

## 2022-06-06 NOTE — Progress Notes (Signed)
Called and spoke with patient, advised of results/recommendations per Roxan Diesel.  She verbalized understanding.   She said she was feeling somewhat better, she is still short winded and tired.  Advised to call us if anything changed before Tuesday. Nothing further needed.

## 2022-06-06 NOTE — Progress Notes (Signed)
Please notify patient that her sputum culture grew out pseudomonas, which is a type of bacteria that can cause respiratory infections. She was treated with levaquin, which would cover for this, and CT chest showed resolved opacity so I would not recommend any more abx at this time. I hope she is starting to feel better. If not, please have her come back in for sooner follow up. Otherwise, I'll see her Tuesday! Thanks!

## 2022-06-07 ENCOUNTER — Other Ambulatory Visit: Payer: Medicaid Other | Admitting: *Deleted

## 2022-06-07 ENCOUNTER — Other Ambulatory Visit: Payer: Medicaid Other

## 2022-06-07 DIAGNOSIS — Z515 Encounter for palliative care: Secondary | ICD-10-CM

## 2022-06-10 NOTE — Progress Notes (Incomplete)
COMMUNITY PALLIATIVE CARE SW NOTE  PATIENT NAME: Margaret Henry DOB: Nov 19, 1950 MRN: [id number]  PRIMARY CARE PROVIDER: [name], MD  RESPONSIBLE PARTY:  Acct ID - Guarantor Home Phone Work Phone Relationship Acct Type  [id number] Margaret Henry, Margaret [phone]  Self P/F     [place], [place], [place] [address]   [place] and RN-M. Nadara Mustard completed a post hospitalization follow-up with patient at her home. Her son-Mike was present with her. Patient was sitting on the couch, wearing her o2 (4L continuous) via nasal canula.   [address] [place], [place]

## 2022-06-10 NOTE — Progress Notes (Unsigned)
COMMUNITY PALLIATIVE CARE SW NOTE  PATIENT NAME: Margaret Henry DOB: 03/22/1951 MRN: [id number]  PRIMARY CARE PROVIDER: [name], MD  RESPONSIBLE PARTY:  Acct ID - Guarantor Home Phone Work Phone Relationship Acct Type  [id number] Margaret Henry, [phone]  Self P/F     [place], [place], [place] [address]   [place] and RN-M. Nadara Mustard completed a post hospitalization follow-up with patient at her home. Her son-Margaret Henry was present with her. Patient was sitting on the couch, wearing her o2 (4L continuous) via nasal canula. Patient report that since her hospitalizations, she continues to have shortness of breath, a productive cough that produces green phlegm.Patient report that her appetite is fair. Her fluid intake is good. She occasionally have Boost protein drink, usually in the mornings, when her appetite is not good. Patient report that she often has nausea with the smell of food. Patient is continent of bowel and bladder, but has occasional accidents.Patient is able to dress herself, but needs assistance with showers and utilizes a shower chair. Patient lives alone with daily contact and have several family members that live on her street and help her when needed.  Her son or other family members also provide transportation to/from medical appointments. Patient request assistance with obtaining in-home care and mobile meals.  SW to assist/follow-up. The team to follow-up in 4-6 weeks.  Duration of visit and documentation: 60 minutes.   [address] [place], [place]

## 2022-06-12 ENCOUNTER — Ambulatory Visit (INDEPENDENT_AMBULATORY_CARE_PROVIDER_SITE_OTHER): Payer: Medicare HMO | Admitting: Nurse Practitioner

## 2022-06-12 ENCOUNTER — Encounter: Payer: Self-pay | Admitting: Nurse Practitioner

## 2022-06-12 DIAGNOSIS — R911 Solitary pulmonary nodule: Secondary | ICD-10-CM

## 2022-06-12 DIAGNOSIS — J9611 Chronic respiratory failure with hypoxia: Secondary | ICD-10-CM

## 2022-06-12 DIAGNOSIS — J441 Chronic obstructive pulmonary disease with (acute) exacerbation: Secondary | ICD-10-CM | POA: Diagnosis not present

## 2022-06-12 DIAGNOSIS — J189 Pneumonia, unspecified organism: Secondary | ICD-10-CM

## 2022-06-12 NOTE — Assessment & Plan Note (Signed)
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 Slowly resolving AECOPD; clinically improved today. She has been treated with 7 day levaquin course for possible CAP, which cleared on imaging, and 5 day course of 500 mg azithromycin. She is back on daily 250 mg azithromycin and 10 mg prednisone. Activity encouraged. Close follow up. Continue triple therapy.  Patient Instructions  Continue Breo 1 puff daily. Brush tongue and rinse mouth afterwards Continue Spiriva 2 puffs daily  Continue Albuterol inhaler 2 puffs or 3 mL neb every 6 hours as needed for shortness of breath or wheezing. Notify if symptoms persist despite rescue inhaler/neb use.  Continue claritin 1 tab daily for allergies  Continue flonase nasal spray 2 sprays each nostril daily Continue azithromycin 250 mg daily Continue supplemental oxygen 3 lpm for goal oxygen >88-90% Continue Flutter valve 2-3 times a day  Continue prednisone 10 mg daily   Stay as active as possible.    Follow up in 4 weeks with Dr. Vaughan Browner or Alanson Aly. If symptoms do not improve or worsen, please contact office for sooner follow up or seek emergency care.

## 2022-06-12 NOTE — Progress Notes (Signed)
$'@Patient'f$  ID: Margaret Henry, female    DOB: 1951/07/04, 71 y.o.   MRN: [id number]  Chief Complaint  Patient presents with   Follow-up    Pt ishere for follow up for SOB. Pt states she is feeling a little bit better. She states that she is still having her SOB episodes. Pt states she is on 3L of oxygen. Pt is on daily prednisone. Pt is on Breo and Sprivai daily with no issues noted so far.     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  She is a patient of Dr. Matilde Bash and last seen 05/31/2022 by Belenda Cruise NP.  Past medical history significant for mild aortic insufficiency, allergic rhinitis, GERD, thyroid goiter, OA, HLD, anemia.  TEST/EVENTS:  07/21/2015 PFTs: FVC 66, FEV1 39, ratio 46, DLCO 36 04/24/2022 LDCT chest: central airways are patent. Severe centrilobular emphysema. New irregular solid subpleural pulmonary nodule of the LLL measuring 7.2 mm. Lung RADS 4A. New linear consolidation of the RML. Other nodules are stable.  05/18/2022 CXR 2 view: hyperinflation, consistent with COPD. Mild atelectasis in the left lung base.  05/29/2022 sputum culture: pseudomonas 06/01/2022 CTA chest: no evidence of PE. Moderate CAD. No LAD. There are emphysematous changes b/l. No acute pulmonary disease. The left posterior basilar opacity has resolved. There is a probable healing subacute fracture involving the anterior portion of the left third rib.  04/02/2022: OV with Dr. Vaughan Browner.  Currently maintained on Spiriva and Breo.  Tried Trelegy in 2019 and 2022 but had to go back on Breo and Spiriva since it made her breathing worse.  She was also tried on Daliresp in 2019 but had to stop due to side effects.  She has been struggling with recurrent COPD exacerbations over the past few years.  She is on maximal therapy with inhalers, chronic prednisone and daily azithromycin.  She continues  to have a productive cough with green mucus.  She has been treated with Levaquin and Augmentin multiple times without any improvement.  Concerned unable to generate enough airflow for the inhalers; discussed switching over to triple therapy nebs but she would like to hold off.  Has finished pulmonary rehab 2 times already.  Treated with Levaquin course for ongoing exacerbation.  Hold off on additional prednisone has no evidence of bronchospasm on exam.  Sputum culture ordered for further evaluation.  Continue flutter valve daily and hypertonic saline to help with mucociliary clearance.  Continue low-dose CT chest for lung cancer screenings.  Palliative care visit pending.  05/23/2022: OV with Kennetha Pearman NP for worsening shortness of breath and increased productive cough.  She has been struggling for quite some time now with recurrent exacerbations.  She has been treated with numerous different antibiotics and was eventually started on daily azithromycin and prednisone.  Her most recent antibiotic course was in June.  She did initially feel a little bit better.  However, over the past few weeks her cough has gotten more productive with green sputum and she had more chest congestion.  She also feels like her breathing is worse.  Gets winded more easily, even which is going to the bathroom.  She has noticed an occasional wheeze.  Denies any hemoptysis, anorexia, fevers, night sweats, weight loss, lower extremity swelling, orthopnea.  She is using Breo and Spiriva.  She does not feel like Memory Dance works that well for her anymore.  Feels like she cannot  really get the medicine in but she does notice a difference when she does not use it.  She had been transition to triple therapy nebulizers after one of her hospitalizations.  They state that they made her extremely jittery and shaky.  She did have recent low-dose CT chest with a new irregular, solid nodule in the left lower lobe measuring 7.2 mm; lung RADS 4A.  There was also a  new linear opacity in the right middle lobe.  She has follow-up CT scheduled in 3 months.  Oxygen has been stable on 2.5 L/min. Treated for AECOPD with levaquin x 7 days and prednisone taper. Trial change to [employer]. CBC without leukocytosis; eosinophils 300. Mucociliary clearance therapies advised. Sputum culture ordered  05/31/2022: OV with Shatyra Becka NP for follow up with her husband.  After she was seen here last, she called EMS on Monday 8/21 and then went to the ED due to chest pain and increased shortness of breath.  She underwent work-up which was negative for ACS.  Suspected that her chest pain was pleuritic in nature.  They obtained CXR which was stable.  No work-up for PE was completed.  They treated her with nebulizer and steroid injection; she had some improvement and was discharged home on increased prednisone and increased azithromycin (500 mg) for 5 days.  Today, she reports that she is still having increased shortness of breath from her baseline.  She is also still having chest discomfort, usually in the middle of her chest , and worse when she coughs.  Cough is relatively unchanged and still with green sputum production. Levaquin didn't seem to impact this. She did collect the sputum culture previously ordered but they returned it at the end of the day and it sat in the fridge overnight, so not sure this will yield accurate results. She does feel little bit better compared to when she was in the ED. Denies any hemoptysis, calf pain/swelling, orthopnea, fevers, chills, anorexia. She stopped the [place]; made her jittery. She is back on Breo and Spiriva. Using her neb three times a day. She is taking mucinex twice a day and using her flutter valve. She's currently on 40 mg of prednisone, which she has two days left of. She's on azithromycin 500 mg daily; plan to go back to 250 mg in 2 days. They did increase her oxygen after her ED visit to 3 lpm. She has maintained in the 90's on this.  D dimer ordered,  which was positive. STAT CTA chest for further evaluation - negative for PE or pna. Previous sputum culture collected 8/17 grew out pseudomonas; completed levaquin course.   06/12/2022: Today - follow up Patient presents today with her husband for follow up. She is starting to feel better. Feels like her breathing is slowly improving and she has been able to do more of her normal activities without getting winded. She is coughing less often and sputum is starting to clear up, less thick and not as copious. She hasn't noticed much wheezing. Chest congestion is also improved. Oxygen levels have been >90% on 3 lpm. She denies any fevers, chills, increased fatigue, hemoptysis, leg swelling, orthopnea, anorexia. She continues on [place], which she preferred over breztri.   Allergies  Allergen Reactions   Propoxyphene Nausea And Vomiting   Hydrocodone Nausea And Vomiting   Propoxyphene N-Acetaminophen Nausea And Vomiting    Immunization History  Administered Date(s) Administered   Fluad Quad(high Dose 65+) 07/20/2019, 07/12/2021   Influenza Split 07/07/2013,  07/05/2016   Influenza Whole 11/09/2009   Influenza,inj,Quad PF,6+ Mos 06/24/2015, 07/03/2017, 06/19/2018   Pneumococcal Conjugate-13 09/26/2015   Pneumococcal Polysaccharide-23 10/08/1998, 07/08/2018   Td 10/08/2002   Tdap 09/10/2016    Past Medical History:  Diagnosis Date   Allergy    hayfever   Bronchitis    Cancer ([facility])    skin cancer on chest   Chest pain 06/12/2016   Colon polyps    Complication of anesthesia    pt states was given too much during nasal surgery 1989; difficulty getting awake   Complication of anesthesia 05/16/2021   pt states was given too much during nasal surgery 1989; difficulty getting awake   COPD (chronic obstructive pulmonary disease) (HCC)    COPD exacerbation ([place]) 06/01/2013   Coronary artery calcification    Diverticulitis    Diverticulitis 05/16/2021   occasional   Dyspnea 06/27/2016   GERD  (gastroesophageal reflux disease)    occasional   Hemorrhoids    High cholesterol    Hyperlipidemia LDL goal <70    Mild aortic insufficiency    Osteoporosis    Pneumonia    Shortness of breath dyspnea    with exertion    Thyroid goiter    bx benign   Tobacco abuse    Vertigo    Vertigo 04/21/2017    Tobacco History: Social History   Tobacco Use  Smoking Status Former   Packs/day: 1.50   Years: 49.00   Total pack years: 73.50   Types: Cigarettes   Quit date: 11/03/2018   Years since quitting: 3.6  Smokeless Tobacco Never   Counseling given: Not Answered   Outpatient Medications Prior to Visit  Medication Sig Dispense Refill   acetaminophen (TYLENOL) 500 MG tablet Take 1,500 mg by mouth every 4 (four) hours as needed for headache.      albuterol (PROVENTIL) (2.5 MG/3ML) 0.083% nebulizer solution Take 2.5 mg by nebulization every 6 (six) hours as needed for wheezing or shortness of breath.     aspirin EC (ASPIRIN ADULT LOW STRENGTH) 81 MG tablet Take 1 tablet (81 mg total) by mouth at bedtime. (Patient taking differently: Take 81 mg by mouth every other day.)     atorvastatin (LIPITOR) 40 MG tablet TAKE 1 TABLET EVERY DAY (Patient taking differently: Take 40 mg by mouth daily.) 90 tablet 3   azithromycin (ZITHROMAX) 250 MG tablet Take 2 tablets by mouth daily for 5 days. Then, resume taking 1 tablet daily. 30 tablet 3   benzonatate (TESSALON) 200 MG capsule Take 1 capsule (200 mg total) by mouth 3 (three) times daily as needed for cough. 30 capsule 1   BREO ELLIPTA 200-25 MCG/ACT AEPB Inhale 1 puff into the lungs daily.     ferrous sulfate 325 (65 FE) MG tablet Take 1 tablet (325 mg total) by mouth 2 (two) times daily with a meal. 100 tablet 2   fluticasone (FLONASE) 50 MCG/ACT nasal spray USE 2 SPRAYS IN EACH NOSTRIL EVERY DAY (Patient taking differently: Place 2 sprays into both nostrils daily as needed for allergies.) 16 g 6   ipratropium-albuterol (DUONEB) 0.5-2.5 (3)  MG/3ML SOLN Take 3 mLs by nebulization every 6 (six) hours as needed. 180 mL 0   loratadine (CLARITIN) 10 MG tablet Take 1 tablet (10 mg total) by mouth daily. 100 tablet 1   mupirocin ointment (BACTROBAN) 2 % Apply 1 Application topically 2 (two) times daily. To affected nostril 22 g 0   OXYGEN Inhale 3 L into the  lungs daily.     predniSONE (DELTASONE) 10 MG tablet Take 1 tablet (10 mg total) by mouth daily with breakfast. 30 tablet 11   Tiotropium Bromide Monohydrate (SPIRIVA RESPIMAT) 2.5 MCG/ACT AERS INHALE 2 PUFFS EVERY DAY (Patient taking differently: Take 2 each by mouth daily.) 12 g 3   vitamin B-12 (CYANOCOBALAMIN) 1000 MCG tablet Take 1 tablet (1,000 mcg total) by mouth daily. 100 tablet 2   Vitamin D, Ergocalciferol, (DRISDOL) 1.25 MG (50000 UNIT) CAPS capsule Take 1 capsule (50,000 Units total) by mouth every 7 (seven) days. 4 capsule 2   furosemide (LASIX) 20 MG tablet Take 1 tablet (20 mg total) by mouth daily. 5 tablet 0   No facility-administered medications prior to visit.     Review of Systems:   Constitutional: No weight loss or gain, night sweats, fevers, chills, or lassitude. +fatigue (improving) HEENT: No headaches, difficulty swallowing, tooth/dental problems, or sore throat. No sneezing, itching, ear ache, nasal congestion, or post nasal drip CV:  No chest pain, orthopnea, PND, swelling in lower extremities, anasarca, dizziness, palpitations, syncope Resp: +shortness of breath with exertion (improving); productive cough (improving). Pleuritic pain (resolved). No wheezing. No hemoptysis.  No chest wall deformity GI:  No heartburn, indigestion, abdominal pain, nausea, vomiting, diarrhea, change in bowel habits, loss of appetite, bloody stools.  MSK:  No joint pain or swelling.  No decreased range of motion.  No back pain. Neuro: No dizziness or lightheadedness.  Psych: No depression or anxiety. Mood stable.     Physical Exam:  BP 122/60 (BP Location: Left Arm,  Patient Position: Sitting, Cuff Size: Normal)   Pulse 86   Ht '5\' 4"'$  (1.626 m)   Wt 118 lb (53.5 kg)   SpO2 91%   BMI 20.25 kg/m   GEN: Pleasant, interactive, chronically-ill appearing; in no acute distress HEENT:  Normocephalic and atraumatic. PERRLA. Sclera white. Nasal turbinates pink, moist and patent bilaterally. No rhinorrhea present. Oropharynx pink and moist, without exudate or edema. No lesions, ulcerations, or postnasal drip.  NECK:  Supple w/ fair ROM. No JVD present. Normal carotid impulses w/o bruits. Thyroid symmetrical with no goiter or nodules palpated. No lymphadenopathy.   CV: RRR, no m/r/g, no peripheral edema. Pulses intact, +2 bilaterally. No cyanosis, pallor or clubbing. PULMONARY:  Unlabored, regular breathing. Diminished bilaterally A&P w/o wheezes/rales/rhonchi. No accessory muscle use. No dullness to percussion. GI: BS present and normoactive. Soft, non-tender to palpation. No organomegaly or masses detected. No CVA tenderness. MSK: No erythema, warmth or tenderness. Cap refil <2 sec all extrem. No deformities or joint swelling noted. Muscle wasting Neuro: A/Ox3. No focal deficits noted.   Skin: Warm, no lesions or rashe Psych: Normal affect and behavior. Judgement and thought content appropriate.     Lab Results:  CBC    Component Value Date/Time   WBC 9.7 05/28/2022 1032   RBC 4.49 05/28/2022 1032   HGB 13.1 05/28/2022 1032   HGB 11.5 04/17/2022 1552   HCT 41.3 05/28/2022 1032   HCT 38.7 04/17/2022 1552   PLT 328 05/28/2022 1032   PLT 423 04/17/2022 1552   MCV 92.0 05/28/2022 1032   MCV 85 04/17/2022 1552   MCH 29.2 05/28/2022 1032   MCHC 31.7 05/28/2022 1032   RDW 21.5 (H) 05/28/2022 1032   RDW 25.6 (H) 04/17/2022 1552   LYMPHSABS 1.7 05/23/2022 1657   LYMPHSABS 0.7 04/17/2022 1552   MONOABS 0.9 05/23/2022 1657   EOSABS 0.3 05/23/2022 1657   EOSABS 0.0 04/17/2022 1552  BASOSABS 0.1 05/23/2022 1657   BASOSABS 0.1 04/17/2022 1552    BMET     Component Value Date/Time   NA 142 05/28/2022 1032   NA 140 11/18/2020 1201   K 3.5 05/28/2022 1032   CL 108 05/28/2022 1032   CO2 26 05/28/2022 1032   GLUCOSE 111 (H) 05/28/2022 1032   BUN 9 05/28/2022 1032   BUN 9 11/18/2020 1201   CREATININE 0.62 05/28/2022 1032   CREATININE 0.60 09/05/2016 1218   CALCIUM 9.3 05/28/2022 1032   GFRNONAA >60 05/28/2022 1032   GFRNONAA >89 03/26/2016 1022   GFRAA 109 11/18/2020 1201   GFRAA >89 03/26/2016 1022    BNP    Component Value Date/Time   BNP 9.9 06/13/2021 1241     Imaging:  CT Angio Chest Pulmonary Embolism (PE) W or WO Contrast  Result Date: 06/01/2022 CLINICAL DATA:  Shortness of breath.  Left-sided chest pain. EXAM: CT ANGIOGRAPHY CHEST WITH CONTRAST TECHNIQUE: Multidetector CT imaging of the chest was performed using the standard protocol during bolus administration of intravenous contrast. Multiplanar CT image reconstructions and MIPs were obtained to evaluate the vascular anatomy. RADIATION DOSE REDUCTION: This exam was performed according to the departmental dose-optimization program which includes automated exposure control, adjustment of the mA and/or kV according to patient size and/or use of iterative reconstruction technique. CONTRAST:  68m OMNIPAQUE IOHEXOL 350 MG/ML SOLN COMPARISON:  April 24, 2022. FINDINGS: Cardiovascular: Satisfactory opacification of the pulmonary arteries to the segmental level. No evidence of pulmonary embolism. Normal heart size. No pericardial effusion. Atherosclerosis of thoracic aorta is noted without aneurysm or dissection. Moderate coronary artery calcifications are noted. Mediastinum/Nodes: No enlarged mediastinal, hilar, or axillary lymph nodes. Thyroid gland, trachea, and esophagus demonstrate no significant findings. Lungs/Pleura: No pneumothorax or pleural effusion is noted. Emphysematous disease is noted bilaterally. No acute pulmonary abnormality is seen. Left posterior basilar opacity noted  on prior exam has resolved. Upper Abdomen: Cholelithiasis. Musculoskeletal: Probable healing subacute fracture is seen involving the anterior portion of the left third rib. Stable old lower thoracic compression fracture is noted. Review of the MIP images confirms the above findings. IMPRESSION: No definite evidence of pulmonary embolus. Probable healing subacute fracture involving the anterior portion of the left third rib. Moderate coronary artery calcifications are noted. Cholelithiasis. Aortic Atherosclerosis (ICD10-I70.0) and Emphysema (ICD10-J43.9). Electronically Signed   By: JMarijo ConceptionM.D.   On: 06/01/2022 11:01   DG Chest Port 1 View  Result Date: 05/28/2022 CLINICAL DATA:  Chest pain/SOB EXAM: PORTABLE CHEST 1 VIEW COMPARISON:  chest x-ray 08/27/2022. FINDINGS: Chronic hyperinflation. No consolidation. No visible pleural effusions or pneumothorax. Cardiomediastinal silhouette is within normal limits. Similar mild scoliosis. IMPRESSION: Chronic hyperinflation, suggestive of emphysema. No evidence of acute cardiopulmonary disease. Electronically Signed   By: FMargaretha SheffieldM.D.   On: 05/28/2022 10:31   DG Chest 2 View  Result Date: 05/19/2022 CLINICAL DATA:  Productive cough.  History of pneumonia. EXAM: CHEST - 2 VIEW COMPARISON:  April 06, 2022 FINDINGS: Mild haziness in the left base is likely atelectasis. Flattening of the diaphragms and hyperinflation of the lungs identified. The heart, hila, mediastinum, lungs, and pleura are otherwise unremarkable. Calcified atherosclerotic changes are identified in the thoracic aorta. IMPRESSION: 1. Hyperinflation of the lungs suggesting COPD or emphysema. Mild atelectasis in the lateral left lung base. No other acute abnormalities. Electronically Signed   By: DDorise BullionIII M.D.   On: 05/19/2022 18:06         Latest  Ref Rng & Units 07/21/2015    9:33 AM  PFT Results  FVC-Pre L 2.06   FVC-Predicted Pre % 66   FVC-Post L 2.23    FVC-Predicted Post % 72   Pre FEV1/FVC % % 45   Post FEV1/FCV % % 46   FEV1-Pre L 0.93   FEV1-Predicted Pre % 39   FEV1-Post L 1.03   DLCO uncorrected ml/min/mmHg 8.56   DLCO UNC% % 36   DLVA Predicted % 39     No results found for: "NITRICOXIDE"      Assessment & Plan:   COPD with acute exacerbation (HCC) She has very severe COPD with high symptom burden and prone to recurrent exacerbation. Slowly resolving AECOPD; clinically improved today. She has been treated with 7 day levaquin course for possible CAP, which cleared on imaging, and 5 day course of 500 mg azithromycin. She is back on daily 250 mg azithromycin and 10 mg prednisone. Activity encouraged. Close follow up. Continue triple therapy.  Patient Instructions  Continue Breo 1 puff daily. Brush tongue and rinse mouth afterwards Continue Spiriva 2 puffs daily  Continue Albuterol inhaler 2 puffs or 3 mL neb every 6 hours as needed for shortness of breath or wheezing. Notify if symptoms persist despite rescue inhaler/neb use.  Continue claritin 1 tab daily for allergies  Continue flonase nasal spray 2 sprays each nostril daily Continue azithromycin 250 mg daily Continue supplemental oxygen 3 lpm for goal oxygen >88-90% Continue Flutter valve 2-3 times a day  Continue prednisone 10 mg daily   Stay as active as possible.    Follow up in 4 weeks with Dr. Vaughan Browner or Alanson Aly. If symptoms do not improve or worsen, please contact office for sooner follow up or seek emergency care.    Chronic respiratory failure (HCC) Stable on 3 lpm. Advised to continue monitoring for goal >88-90%. If she is back to baseline and sats are stable, she can try to wean back to 2.5 lpm.   CAP (community acquired pneumonia) LLL opacity on CXR from 8/11. She was seen 8/16 and started on levaquin course. Sputum culture collected on this date resulted on 8/22 and positive for pseudomonas, sensitive to levaquin. Resolved on CTA chest 8/25. See  above.   Lung nodule There is a concerning irregular, solid nodule in the left lower lobe on recent low-dose lung cancer screening from July 2023.  It measured 7.2 mm, which falls below the PET threshold.  Not fully appreciated on CTA chest. Discussed with Eric Form, NP, who manages lung cancer screening program. Recommendation was to keep LDCT chest in October 2023 for 22-monthfollow-up.      I spent 28 minutes of dedicated to the care of this patient on the date of this encounter to include pre-visit review of records, face-to-face time with the patient discussing conditions above, post visit ordering of testing, clinical documentation with the electronic health record, making appropriate referrals as documented, and communicating necessary findings to members of the patients care team.  KClayton Bibles NP 06/12/2022  Pt aware and understands NP's role.

## 2022-06-12 NOTE — Assessment & Plan Note (Signed)
There is a concerning irregular, solid nodule in the left lower lobe on recent low-dose lung cancer screening from July 2023. It measured 7.2 mm, which falls below the PET threshold. Not fully appreciated on CTA chest. Discussed with Eric Form, NP, who manages lung cancer screening program. Recommendation was to keep LDCT chest in October 2023 for 22-monthfollow-up.

## 2022-06-12 NOTE — Assessment & Plan Note (Addendum)
Stable on 3 lpm. Advised to continue monitoring for goal >88-90%. If she is back to baseline and sats are stable, she can try to wean back to 2.5 lpm.

## 2022-06-12 NOTE — Patient Instructions (Addendum)
Continue Breo 1 puff daily. Brush tongue and rinse mouth afterwards Continue Spiriva 2 puffs daily  Continue Albuterol inhaler 2 puffs or 3 mL neb every 6 hours as needed for shortness of breath or wheezing. Notify if symptoms persist despite rescue inhaler/neb use.  Continue claritin 1 tab daily for allergies  Continue flonase nasal spray 2 sprays each nostril daily Continue azithromycin 250 mg daily Continue supplemental oxygen 3 lpm for goal oxygen >88-90% Continue Flutter valve 2-3 times a day  Continue prednisone 10 mg daily   Stay as active as possible.    Follow up in 4 weeks with Dr. Vaughan Browner or Alanson Aly. If symptoms do not improve or worsen, please contact office for sooner follow up or seek emergency care.

## 2022-06-12 NOTE — Assessment & Plan Note (Signed)
LLL opacity on CXR from 8/11. She was seen 8/16 and started on levaquin course. Sputum culture collected on this date resulted on 8/22 and positive for pseudomonas, sensitive to levaquin. Resolved on CTA chest 8/25. See above.

## 2022-06-13 ENCOUNTER — Other Ambulatory Visit (HOSPITAL_BASED_OUTPATIENT_CLINIC_OR_DEPARTMENT_OTHER): Payer: Self-pay

## 2022-06-13 ENCOUNTER — Telehealth: Payer: Self-pay | Admitting: Nurse Practitioner

## 2022-06-13 ENCOUNTER — Other Ambulatory Visit: Payer: Self-pay | Admitting: Nurse Practitioner

## 2022-06-14 ENCOUNTER — Other Ambulatory Visit (HOSPITAL_BASED_OUTPATIENT_CLINIC_OR_DEPARTMENT_OTHER): Payer: Self-pay

## 2022-06-14 NOTE — Telephone Encounter (Signed)
Patient called back regarding prednisone refill- she is completely out and should be taking '10MG'$  a day. Please advise- call back [phone].

## 2022-06-14 NOTE — Telephone Encounter (Signed)
I called the pharmacy and per Pam they have refills on her 10 mg Prednisone.   I have let the patient know that she had refills on file of her Prednisone. She voices understanding. Nothing further needed.

## 2022-06-15 ENCOUNTER — Telehealth: Payer: Self-pay | Admitting: Family Medicine

## 2022-06-15 NOTE — Telephone Encounter (Signed)
Verbal order were given for patient. 

## 2022-06-15 NOTE — Telephone Encounter (Signed)
Shirlee Limerick calling from [facility] is calling for verbal order for [facility] PT recertification for Frequency 1 time a week for 9 weeks [phone] verbal ok on vm

## 2022-06-19 ENCOUNTER — Other Ambulatory Visit: Payer: Self-pay | Admitting: Family Medicine

## 2022-06-19 DIAGNOSIS — J449 Chronic obstructive pulmonary disease, unspecified: Secondary | ICD-10-CM

## 2022-06-21 ENCOUNTER — Telehealth: Payer: Self-pay

## 2022-06-21 NOTE — Telephone Encounter (Signed)
(  2:27 pm) PC SW left a message for patient  and her son-Mike advising that she had completed referral for mobile meals and personal care services [facility]) application was forwarded to pulmonology for signature.

## 2022-06-27 ENCOUNTER — Other Ambulatory Visit (HOSPITAL_BASED_OUTPATIENT_CLINIC_OR_DEPARTMENT_OTHER): Payer: Self-pay

## 2022-07-04 ENCOUNTER — Ambulatory Visit: Payer: Medicare HMO | Attending: Family Medicine | Admitting: Family Medicine

## 2022-07-04 ENCOUNTER — Encounter: Payer: Self-pay | Admitting: Family Medicine

## 2022-07-04 ENCOUNTER — Other Ambulatory Visit (HOSPITAL_BASED_OUTPATIENT_CLINIC_OR_DEPARTMENT_OTHER): Payer: Self-pay

## 2022-07-04 VITALS — BP 124/68 | HR 114 | Temp 98.4°F | Ht 64.0 in

## 2022-07-04 DIAGNOSIS — Z0001 Encounter for general adult medical examination with abnormal findings: Secondary | ICD-10-CM

## 2022-07-04 DIAGNOSIS — Z23 Encounter for immunization: Secondary | ICD-10-CM

## 2022-07-04 DIAGNOSIS — B351 Tinea unguium: Secondary | ICD-10-CM | POA: Diagnosis not present

## 2022-07-04 DIAGNOSIS — Z1211 Encounter for screening for malignant neoplasm of colon: Secondary | ICD-10-CM

## 2022-07-04 DIAGNOSIS — R350 Frequency of micturition: Secondary | ICD-10-CM | POA: Diagnosis not present

## 2022-07-04 DIAGNOSIS — Z0000 Encounter for general adult medical examination without abnormal findings: Secondary | ICD-10-CM

## 2022-07-04 DIAGNOSIS — M545 Low back pain, unspecified: Secondary | ICD-10-CM

## 2022-07-04 DIAGNOSIS — Z13228 Encounter for screening for other metabolic disorders: Secondary | ICD-10-CM

## 2022-07-04 DIAGNOSIS — Z2911 Encounter for prophylactic immunotherapy for respiratory syncytial virus (RSV): Secondary | ICD-10-CM

## 2022-07-04 LAB — POCT URINALYSIS DIP (CLINITEK)
Bilirubin, UA: NEGATIVE
Glucose, UA: 100 mg/dL — AB
Ketones, POC UA: NEGATIVE mg/dL
Leukocytes, UA: NEGATIVE
Nitrite, UA: NEGATIVE
POC PROTEIN,UA: NEGATIVE
Spec Grav, UA: 1.015 (ref 1.010–1.025)
Urobilinogen, UA: 0.2 E.U./dL
pH, UA: 7 (ref 5.0–8.0)

## 2022-07-04 MED ORDER — MELOXICAM 7.5 MG PO TABS
7.5000 mg | ORAL_TABLET | Freq: Every day | ORAL | 1 refills | Status: DC
  Filled 2022-07-04: qty 30, 30d supply, fill #0
  Filled 2022-08-06: qty 30, 30d supply, fill #1

## 2022-07-04 MED ORDER — AREXVY 120 MCG/0.5ML IM SUSR: 0.5000 mL | Freq: Once | INTRAMUSCULAR | 0 refills | Status: AC

## 2022-07-04 MED ORDER — TERBINAFINE HCL 250 MG PO TABS
250.0000 mg | ORAL_TABLET | Freq: Every day | ORAL | 1 refills | Status: DC
  Filled 2022-07-04: qty 30, 30d supply, fill #0
  Filled 2022-08-06: qty 30, 30d supply, fill #1

## 2022-07-04 NOTE — Progress Notes (Signed)
Subjective:   Margaret Henry is a 71 y.o. female who presents for Medicare Annual (Subsequent) preventive examination.  Her lower back has been hurting x1 week after she attempted getting up from from the toilet seat and felt a sharp pain. She noticed urinary frequency almost every 10 minutes since then but urinary frequency has improved slightly but is still present. She is unable to lie supine due to pain and has to use the recliner. She has no numbness in her legs, no loss of sphincteric function. Her son has been applying Aspercreme. She did try a muscle relaxant which made her nauseous.  She has questions about having a chronic bacteria in her lungs which she was informed would not be treated.  She is concerned because she continues to cough and sometimes sputum is greenish.  Last visit with pulmonary was on 06/12/2022.  Last month she had received a prescription for Levaquin for possible CAP and positive sputum culture of Pseudomonas.  She is currently on antibiotic prophylaxis with azithromycin.  Sees pulmonary tomorrow.  She complains of discoloration of her great toenails bilaterally which she has had for a few months.  At her last visit she had received a prescription for Keflex due to the fact that she did have erythema of the surrounding great toe tissue.  Review of Systems    General: negative for fever, weight loss, appetite change Eyes: no visual symptoms. ENT: no ear symptoms, no sinus tenderness, no nasal congestion or sore throat. Neck: no pain  Respiratory: no wheezing, +shortness of breath, +cough Cardiovascular: no chest pain, +dyspnea on exertion, no pedal edema, no orthopnea. Gastrointestinal: no abdominal pain, no diarrhea, no constipation Genito-Urinary: See HPI  Hematologic: + Bruise on anterior left ankle Endocrine: no cold or heat intolerance Neurological: no headaches, no seizures, no tremors Musculoskeletal: See HPI Skin: no pruritus, +skin rash Psychological:  no depression, no anxiety,   Cardiac Risk Factors include: none     Objective:    Today's Vitals   07/04/22 1527  BP: 124/68  Pulse: (!) 114  Temp: 98.4 F (36.9 C)  TempSrc: Oral  SpO2: 93%  Height: 5' 4"  (1.626 m)  PainSc: 10-Worst pain ever   Body mass index is 20.25 kg/m.     07/04/2022    3:30 PM 04/06/2022   11:00 PM 08/13/2021    3:54 PM 06/13/2021   12:17 PM 06/10/2020    2:45 PM 02/25/2020    8:40 AM 12/03/2018    8:53 AM  Advanced Directives  Does Patient Have a Medical Advance Directive? No No No No No No No  Would patient like information on creating a medical advance directive?  No - Patient declined  No - Patient declined  No - Guardian declined No - Patient declined    Current Medications (verified) Outpatient Encounter Medications as of 07/04/2022  Medication Sig   acetaminophen (TYLENOL) 500 MG tablet Take 1,500 mg by mouth every 4 (four) hours as needed for headache.    albuterol (PROVENTIL) (2.5 MG/3ML) 0.083% nebulizer solution Take 2.5 mg by nebulization every 6 (six) hours as needed for wheezing or shortness of breath.   aspirin EC (ASPIRIN ADULT LOW STRENGTH) 81 MG tablet Take 1 tablet (81 mg total) by mouth at bedtime. (Patient taking differently: Take 81 mg by mouth every other day.)   atorvastatin (LIPITOR) 40 MG tablet TAKE 1 TABLET EVERY DAY (Patient taking differently: Take 40 mg by mouth daily.)   azithromycin (ZITHROMAX) 250 MG tablet  Take 2 tablets by mouth daily for 5 days. Then, resume taking 1 tablet daily.   benzonatate (TESSALON) 200 MG capsule Take 1 capsule (200 mg total) by mouth 3 (three) times daily as needed for cough.   BREO ELLIPTA 200-25 MCG/ACT AEPB Inhale 1 puff into the lungs daily.   ferrous sulfate 325 (65 FE) MG tablet Take 1 tablet (325 mg total) by mouth 2 (two) times daily with a meal.   fluticasone (FLONASE) 50 MCG/ACT nasal spray USE 2 SPRAYS IN EACH NOSTRIL EVERY DAY (Patient taking differently: Place 2 sprays into both  nostrils daily as needed for allergies.)   ipratropium-albuterol (DUONEB) 0.5-2.5 (3) MG/3ML SOLN INHALE THE CONTENTS OF 1 VIAL VIA NEBULIZER EVERY 6 HOURS AS NEEDED   loratadine (CLARITIN) 10 MG tablet Take 1 tablet (10 mg total) by mouth daily.   mupirocin ointment (BACTROBAN) 2 % Apply 1 Application topically 2 (two) times daily. To affected nostril   OXYGEN Inhale 3 L into the lungs daily.   predniSONE (DELTASONE) 10 MG tablet Take 1 tablet (10 mg total) by mouth daily with breakfast.   Tiotropium Bromide Monohydrate (SPIRIVA RESPIMAT) 2.5 MCG/ACT AERS INHALE 2 PUFFS EVERY DAY (Patient taking differently: Take 2 each by mouth daily.)   vitamin B-12 (CYANOCOBALAMIN) 1000 MCG tablet Take 1 tablet (1,000 mcg total) by mouth daily.   Vitamin D, Ergocalciferol, (DRISDOL) 1.25 MG (50000 UNIT) CAPS capsule Take 1 capsule (50,000 Units total) by mouth every 7 (seven) days.   No facility-administered encounter medications on file as of 07/04/2022.    Allergies (verified) Propoxyphene, Hydrocodone, and Propoxyphene n-acetaminophen   History: Past Medical History:  Diagnosis Date   Allergy    hayfever   Anemia 04/06/22   Blood transfusion without reported diagnosis 6/23   Bronchitis    Cancer ([place])    skin cancer on chest   Cataract 5/23   Chest pain 06/12/2016   Colon polyps    Complication of anesthesia    pt states was given too much during nasal surgery 1989; difficulty getting awake   Complication of anesthesia 05/16/2021   pt states was given too much during nasal surgery 1989; difficulty getting awake   COPD (chronic obstructive pulmonary disease) ([place])    COPD exacerbation ([place]) 06/01/2013   Coronary artery calcification    Diverticulitis    Diverticulitis 05/16/2021   occasional   Dyspnea 06/27/2016   Emphysema of lung ([facility])    GERD (gastroesophageal reflux disease)    occasional   Hemorrhoids    High cholesterol    Hyperlipidemia LDL goal <70    Mild aortic  insufficiency    Osteoporosis    Oxygen deficiency    Pneumonia    Shortness of breath dyspnea    with exertion    Thyroid goiter    bx benign   Tobacco abuse    Vertigo    Vertigo 04/21/2017   Past Surgical History:  Procedure Laterality Date   ABDOMINAL HYSTERECTOMY     APPENDECTOMY  1973   BILATERAL OOPHORECTOMY  10/09/1999   for benign ovarian mass    BIOPSY THYROID     DENTAL SURGERY     dentures   NASAL SEPTUM SURGERY     PARTIAL HYSTERECTOMY  10/08/1974   for heavy menses    RECTAL EXAM UNDER ANESTHESIA N/A 12/14/2015   Procedure: RECTAL EXAM UNDER ANESTHESIA REMOVAL OF ANAL CANAL MASS; INTERNAL HEMORRHOID LIGATION, EXTERNAL HEMORRHOID LIGATION;  Surgeon: Michael Boston, MD;  Location: [facility];  Service: General;  Laterality: N/A;   TUBAL LIGATION     WISDOM TOOTH EXTRACTION     Family History  Problem Relation Age of Onset   Heart disease Mother    Alcohol abuse Mother    Asthma Mother    COPD Father    Heart disease Father    Lung cancer Maternal Grandfather    Cancer Maternal Grandfather    Liver cancer Paternal Grandmother    Cancer Paternal Grandmother    Diabetes Paternal Aunt    Colon cancer Neg Hx    Colon polyps Neg Hx    Esophageal cancer Neg Hx    Rectal cancer Neg Hx    Stomach cancer Neg Hx    Social History   Socioeconomic History   Marital status: Widowed    Spouse name: Not on file   Number of children: Not on file   Years of education: Not on file   Highest education level: Not on file  Occupational History   Not on file  Tobacco Use   Smoking status: Former    Packs/day: 1.50    Years: 50.00    Total pack years: 75.00    Types: Cigarettes    Quit date: 11/03/2018    Years since quitting: 3.6   Smokeless tobacco: Never  Vaping Use   Vaping Use: Never used  Substance and Sexual Activity   Alcohol use: No   Drug use: No   Sexual activity: Never  Other Topics Concern   Not on file  Social History Narrative   Not on file    Social Determinants of Health   Financial Resource Strain: Low Risk  (07/04/2022)   Overall Financial Resource Strain (CARDIA)    Difficulty of Paying Living Expenses: Not very hard  Food Insecurity: No Food Insecurity (07/04/2022)   Hunger Vital Sign    Worried About Running Out of Food in the Last Year: Never true    [place] in the Last Year: Never true  Transportation Needs: No Transportation Needs (07/04/2022)   PRAPARE - Hydrologist (Medical): No    Lack of Transportation (Non-Medical): No  Physical Activity: Insufficiently Active (07/04/2022)   Exercise Vital Sign    Days of Exercise per Week: 2 days    Minutes of Exercise per Session: 60 min  Stress: No Stress Concern Present (07/04/2022)   [place]    Feeling of Stress : Not at all  Social Connections: Socially Isolated (07/04/2022)   Social Connection and Isolation Panel [NHANES]    Frequency of Communication with Friends and Family: More than three times a week    Frequency of Social Gatherings with Friends and Family: Three times a week    Attends Religious Services: Never    Active Member of Clubs or Organizations: No    Attends Archivist Meetings: Never    Marital Status: Widowed    Tobacco Counseling Counseling given: Not Answered   Clinical Intake:  Pre-visit preparation completed: No  Pain : 0-10 Pain Score: 10-Worst pain ever Pain Location: Back Pain Orientation: Lower     Diabetes: No  How often do you need to have someone help you when you read instructions, pamphlets, or other written materials from your doctor or pharmacy?: 1 - Never  Diabetic?No  Interpreter Needed?: No      Activities of Daily Living    07/04/2022    3:30 PM  06/30/2022    2:22 PM  In your present state of health, do you have any difficulty performing the following activities:  Hearing? 0 0  Vision? 0 0   Difficulty concentrating or making decisions? 0 0  Walking or climbing stairs? 0 1  Dressing or bathing? 1 1  Doing errands, shopping? 1 1  Preparing Food and eating ? N Y  Using the Toilet? N N  In the past six months, have you accidently leaked urine? Y Y  Do you have problems with loss of bowel control? N Y  Managing your Medications? N N  Managing your Finances? N N  Housekeeping or managing your Housekeeping? N Y    Patient Care Team: [name], MD as PCP - General (Family Medicine) Dorothy Spark, MD as PCP - Cardiology (Cardiology) Michael Boston, MD as Consulting Physician (General Surgery) Pyrtle, Lajuan Lines, MD as Consulting Physician (Gastroenterology) Marshell Garfinkel, MD as Consulting Physician (Pulmonary Disease)  Indicate any recent Medical Services you may have received from other than [facility] providers in the past year (date may be approximate).     Assessment:   This is a routine wellness examination for Margaret Henry.  Hearing/Vision screen No results found.  Dietary issues and exercise activities discussed: Current Exercise Habits: The patient does not participate in regular exercise at present, Exercise limited by: None identified   Goals Addressed   None    Depression Screen    07/04/2022    3:38 PM 04/17/2022    3:06 PM 10/22/2018    3:52 PM 03/17/2018   11:18 AM 01/03/2018    1:32 PM 10/04/2017    1:54 PM 05/30/2017   10:38 AM  PHQ 2/9 Scores  PHQ - 2 Score 0 0 0 0 0 0 0  PHQ- 9 Score  0 3 3 3 1 2     Fall Risk    07/04/2022    3:30 PM 06/30/2022    2:22 PM 04/17/2022    3:02 PM 09/05/2021    2:24 PM 09/29/2020   10:38 AM  Fall Risk   Falls in the past year? 0 0 0 0 0  Number falls in past yr: 0 0 0 0 0  Injury with Fall? 0 0 0 0 0  Risk for fall due to : No Fall Risks        FALL RISK PREVENTION PERTAINING TO THE HOME:  Any stairs in or around the home? No  If so, are there any without handrails?  Not applicable Home free of loose throw  rugs in walkways, pet beds, electrical cords, etc? Yes  Adequate lighting in your home to reduce risk of falls? Yes   ASSISTIVE DEVICES UTILIZED TO PREVENT FALLS:  Life alert? No  Use of a cane, walker or w/c? Yes  Grab bars in the bathroom? Yes  Shower chair or bench in shower? Yes  Elevated toilet seat or a handicapped toilet? No   TIMED UP AND GO:  Was the test performed? No .  Length of time to ambulate 10 feet: Not performed sec.     Cognitive Function:        Immunizations Immunization History  Administered Date(s) Administered   Fluad Quad(high Dose 65+) 07/20/2019, 07/12/2021   Influenza Split 07/07/2013, 07/05/2016   Influenza Whole 11/09/2009   Influenza,inj,Quad PF,6+ Mos 06/24/2015, 07/03/2017, 06/19/2018   Pneumococcal Conjugate-13 09/26/2015   Pneumococcal Polysaccharide-23 10/08/1998, 07/08/2018   Td 10/08/2002   Td (Adult), 2 Lf Tetanus Toxid, Preservative Free  10/08/2002   Tdap 09/10/2016    Screening Tests Health Maintenance  Topic Date Due   COVID-19 Vaccine (1) Never done   COLONOSCOPY (Pts 45-16yr Insurance coverage will need to be confirmed)  10/10/2020   INFLUENZA VACCINE  05/08/2022   Zoster Vaccines- Shingrix (1 of 2) 10/03/2022 (Originally 05/25/1970)   MAMMOGRAM  09/27/2023   TETANUS/TDAP  09/10/2026   Pneumonia Vaccine 72 Years old  Completed   DEXA SCAN  Completed   Hepatitis C Screening  Completed   HPV VACCINES  Aged Out    Health Maintenance  Health Maintenance Due  Topic Date Due   COVID-19 Vaccine (1) Never done   COLONOSCOPY (Pts 45-474yrInsurance coverage will need to be confirmed)  10/10/2020   INFLUENZA VACCINE  05/08/2022    Colorectal cancer screening: Type of screening: Cologuard. Completed no, ordered today. Repeat every 3 years  Mammogram status: Completed 09/2021. Repeat every year  Bone density study: From 2010 revealed osteoporosis  Lung Cancer Screening: (Low Dose CT Chest recommended if Age 72-80years, 30 pack-year currently smoking OR have quit w/in 15years.) does qualify.   Lung Cancer Screening Referral: CT scheduled for 07/2022  Additional Screening:  Hepatitis C Screening: does qualify; Completed 09/2015 Dental Screening: Recommended annual dental exams for proper oral hygiene   Constitutional: normal appearing, sitting comfortably in the wheelchair Eyes: PERRLA HEENT: Head is atraumatic, normal sinuses, normal oropharynx, normal appearing tonsils and palate, tympanic membrane is normal bilaterally.  On oxygen via nasal cannula Neck: normal range of motion, no thyromegaly, no JVD Cardiovascular: Tachycardic rate and normal rhythm, normal heart sounds, no murmurs, rub or gallop, no pedal edema Respiratory: Normal breath sounds, clear to auscultation bilaterally, no wheezes, no rales, no rhonchi Abdomen: soft, not tender to palpation, normal bowel sounds, no enlarged organs Musculoskeletal: Tenderness on palpation of lumbar spine more on the left lumbar region with associated immobile bumps on spine Skin: warm and dry, no lesions. Neurological: alert, oriented x3, cranial nerves I-XII grossly intact , normal motor strength, normal sensation. Psychological: normal mood.  Community Resource Referral / Chronic Care Management: CRR required this visit?  No   CCM required this visit?  No      Plan:   1. Encounter for Medicare annual wellness exam Counseled on age-appropriate healthcare maintenance  2. Lumbar spine pain We will need to exclude fracture Previous bone density did reveal presence of osteoporosis We will review lumbar spine x-ray report and discuss initiating Fosamax UA is negative for UTI - DG Lumbar Spine Complete; Future - meloxicam (MOBIC) 7.5 MG tablet; Take 1 tablet (7.5 mg total) by mouth daily.  Dispense: 30 tablet; Refill: 1 - POCT URINALYSIS DIP (CLINITEK)  3. Onychomycosis - terbinafine (LAMISIL) 250 MG tablet; Take 1 tablet (250 mg total) by  mouth daily.  Dispense: 30 tablet; Refill: 1 - Ambulatory referral to Podiatry  4. Screening for metabolic disorder - [id number]. Screening for colon cancer - Cologuard  6. Urinary frequency UA negative for UTI We will screen for diabetes - Hemoglobin A1c  7. Flu vaccine need - Flu Vaccine QUAD High Dose(Fluad)  8. Need for RSV immunization She is high risk and will benefit from RSV immunization - RSV vaccine recomb adjuvanted (AREXVY) 120 MCG/0.5ML injection; Inject 0.5 mLs into the muscle once for 1 dose.  Dispense: 0.5 mL; Refill: 0   I have personally reviewed and noted the following in the patient's chart:   Medical and social history Use of alcohol,  tobacco or illicit drugs  Current medications and supplements including opioid prescriptions. Patient is not currently taking opioid prescriptions. Functional ability and status Nutritional status Physical activity Advanced directives List of other physicians Hospitalizations, surgeries, and ER visits in previous 12 months Vitals Screenings to include cognitive, depression, and falls Referrals and appointments  In addition, I have reviewed and discussed with patient certain preventive protocols, quality metrics, and best practice recommendations. A written personalized care plan for preventive services as well as general preventive health recommendations were provided to patient.     [name], MD   07/04/2022

## 2022-07-04 NOTE — Patient Instructions (Signed)
  Ms. Margaret Henry , Thank you for taking time to come for your Medicare Wellness Visit. I appreciate your ongoing commitment to your health goals. Please review the following plan we discussed and let me know if I can assist you in the future.   These are the goals we discussed:  Goals      Quit smoking / using tobacco        This is a list of the screening recommended for you and due dates:  Health Maintenance  Topic Date Due   COVID-19 Vaccine (1) Never done   Cologuard (Stool DNA test)  Never done   Flu Shot  05/08/2022   Zoster (Shingles) Vaccine (1 of 2) 10/03/2022*   Mammogram  09/27/2023   Tetanus Vaccine  09/10/2026   Pneumonia Vaccine  Completed   DEXA scan (bone density measurement)  Completed   Hepatitis C Screening: USPSTF Recommendation to screen - Ages 23-79 yo.  Completed   HPV Vaccine  Aged Out   Colon Cancer Screening  Discontinued  *Topic was postponed. The date shown is not the original due date.

## 2022-07-04 NOTE — Progress Notes (Signed)
AWV

## 2022-07-05 ENCOUNTER — Encounter: Payer: Self-pay | Admitting: Pulmonary Disease

## 2022-07-05 ENCOUNTER — Ambulatory Visit (HOSPITAL_BASED_OUTPATIENT_CLINIC_OR_DEPARTMENT_OTHER)
Admission: RE | Admit: 2022-07-05 | Discharge: 2022-07-05 | Disposition: A | Discharge status: Home / Self Care | Payer: Medicare HMO | Source: Ambulatory Visit | Attending: Family Medicine | Admitting: Family Medicine

## 2022-07-05 ENCOUNTER — Other Ambulatory Visit (HOSPITAL_BASED_OUTPATIENT_CLINIC_OR_DEPARTMENT_OTHER): Payer: Self-pay

## 2022-07-05 ENCOUNTER — Ambulatory Visit (INDEPENDENT_AMBULATORY_CARE_PROVIDER_SITE_OTHER): Payer: Medicare HMO | Admitting: Pulmonary Disease

## 2022-07-05 VITALS — BP 120/62 | HR 104 | Ht 64.0 in

## 2022-07-05 DIAGNOSIS — J449 Chronic obstructive pulmonary disease, unspecified: Secondary | ICD-10-CM

## 2022-07-05 DIAGNOSIS — M545 Low back pain, unspecified: Secondary | ICD-10-CM | POA: Diagnosis not present

## 2022-07-05 LAB — CMP14+EGFR
ALT: 15 IU/L (ref 0–32)
AST: 16 IU/L (ref 0–40)
Albumin/Globulin Ratio: 1.9 (ref 1.2–2.2)
Albumin: 4.3 g/dL (ref 3.8–4.8)
Alkaline Phosphatase: 121 IU/L (ref 44–121)
BUN/Creatinine Ratio: 20 (ref 12–28)
BUN: 12 mg/dL (ref 8–27)
Bilirubin Total: <0.2 mg/dL (ref 0.0–1.2)
CO2: 23 mmol/L (ref 20–29)
Calcium: 9.6 mg/dL (ref 8.7–10.3)
Chloride: 104 mmol/L (ref 96–106)
Creatinine, Ser: 0.59 mg/dL (ref 0.57–1.00)
Globulin, Total: 2.3 g/dL (ref 1.5–4.5)
Glucose: 109 mg/dL — ABNORMAL HIGH (ref 70–99)
Potassium: 4.5 mmol/L (ref 3.5–5.2)
Sodium: 144 mmol/L (ref 134–144)
Total Protein: 6.6 g/dL (ref 6.0–8.5)
eGFR: 96 mL/min/{1.73_m2} (ref >59)

## 2022-07-05 LAB — HEMOGLOBIN A1C
Est. average glucose Bld gHb Est-mCnc: 111 mg/dL
Hgb A1c MFr Bld: 5.5 % (ref 4.8–5.6)

## 2022-07-05 MED ORDER — AREXVY 120 MCG/0.5ML IM SUSR
INTRAMUSCULAR | 0 refills | Status: DC
  Filled 2022-07-05: qty 1, 1d supply, fill #0

## 2022-07-05 NOTE — Progress Notes (Signed)
LAYLONI FAHRNER    [id number]    May 28, 1951  Primary Care Physician:Newlin, Charlane Ferretti, MD  Referring Physician: [name], MD [place],  [place] [address]  Chief complaint:   Follow up for COPD GOLD D  HPI: [name] is a 71 y.o. 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 Her symptoms consist mainly of shortness of breath, daily cough with whitish sputum production, wheeze, dyspnea and exertion. She does not have any hemoptysis, chest pain, palpitations. No fevers, chills.   She is currently maintained on Spiriva and breo.  Tried on Trelegy inhaler in 2019 but had to go back to [place] and Spiriva since it made her breathing worse.  Also tried on Daliresp in 2019 but had to stop due to side effects of nausea  Continues on Breo and Spiriva She had tried Trelegy again in early 2022 but it made her breathing worse She got a course of prednisone in March 2022 for mild COPD exacerbation.  She has been struggling with recurrent COPD exacerbations over the past few years. On maximal therapy with inhalers, chronic prednisone and daily azithromycin She had smoked one pack per day for nearly 25 years. Quit in 2020. 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 She does not recall any particular exposures at work or at home.  Interim history: Seen by Roxan Diesel, nurse practitioner in early September for COPD exacerbation.  Sputum grew out Pseudomonas.  Treated treated with Levaquin and prednisone  On chronic prednisone and azithromycin 250 mg a day States that she has been stable over the past week  She is going to get chest x-ray of spine from primary care due to back pain.  Outpatient Encounter Medications as of 07/05/2022  Medication Sig   acetaminophen (TYLENOL) 500 MG tablet Take 1,500 mg by mouth every 4 (four) hours as needed for headache.    albuterol (PROVENTIL) (2.5 MG/3ML) 0.083%  nebulizer solution Take 2.5 mg by nebulization every 6 (six) hours as needed for wheezing or shortness of breath.   aspirin EC (ASPIRIN ADULT LOW STRENGTH) 81 MG tablet Take 1 tablet (81 mg total) by mouth at bedtime. (Patient taking differently: Take 81 mg by mouth every other day.)   atorvastatin (LIPITOR) 40 MG tablet TAKE 1 TABLET EVERY DAY (Patient taking differently: Take 40 mg by mouth daily.)   azithromycin (ZITHROMAX) 250 MG tablet Take 2 tablets by mouth daily for 5 days. Then, resume taking 1 tablet daily.   benzonatate (TESSALON) 200 MG capsule Take 1 capsule (200 mg total) by mouth 3 (three) times daily as needed for cough.   BREO ELLIPTA 200-25 MCG/ACT AEPB Inhale 1 puff into the lungs daily.   ferrous sulfate 325 (65 FE) MG tablet Take 1 tablet (325 mg total) by mouth 2 (two) times daily with a meal.   fluticasone (FLONASE) 50 MCG/ACT nasal spray USE 2 SPRAYS IN EACH NOSTRIL EVERY DAY (Patient taking differently: Place 2 sprays into both nostrils daily as needed for allergies.)   ipratropium-albuterol (DUONEB) 0.5-2.5 (3) MG/3ML SOLN INHALE THE CONTENTS OF 1 VIAL VIA NEBULIZER EVERY 6 HOURS AS NEEDED   loratadine (CLARITIN) 10 MG tablet Take 1 tablet (10 mg total) by mouth daily.   meloxicam (MOBIC) 7.5 MG tablet Take 1 tablet (7.5 mg total) by mouth daily.   mupirocin ointment (BACTROBAN) 2 % Apply 1 Application topically  2 (two) times daily. To affected nostril   OXYGEN Inhale 3 L into the lungs daily.   predniSONE (DELTASONE) 10 MG tablet Take 1 tablet (10 mg total) by mouth daily with breakfast.   terbinafine (LAMISIL) 250 MG tablet Take 1 tablet (250 mg total) by mouth daily.   Tiotropium Bromide Monohydrate (SPIRIVA RESPIMAT) 2.5 MCG/ACT AERS INHALE 2 PUFFS EVERY DAY (Patient taking differently: Take 2 each by mouth daily.)   vitamin B-12 (CYANOCOBALAMIN) 1000 MCG tablet Take 1 tablet (1,000 mcg total) by mouth daily.   Vitamin D, Ergocalciferol, (DRISDOL) 1.25 MG (50000 UNIT)  CAPS capsule Take 1 capsule (50,000 Units total) by mouth every 7 (seven) days.   No facility-administered encounter medications on file as of 07/05/2022.   Physical Exam: Blood pressure 120/62, pulse (!) 104, height '5\' 4"'$  (1.626 m), SpO2 94 %. Gen:      No acute distress HEENT:  EOMI, sclera anicteric Neck:     No masses; no thyromegaly Lungs:    Clear to auscultation bilaterally; normal respiratory effort CV:         Regular rate and rhythm; no murmurs Abd:      + bowel sounds; soft, non-tender; no palpable masses, no distension Ext:    No edema; adequate peripheral perfusion Skin:      Warm and dry; no rash Neuro: alert and oriented x 3 Psych: normal mood and affect   Data Reviewed: Imaging: Screening CT chest 03/07/18- calcified right thyroid nodule.  Severe centrilobular emphysema, bronchial wall thickening.  Previously described 4.8 mm right lower lobe nodule now measures 2.9 mm.  Granulomatous splenic and liver calcification.  CT 10/20/2019-resolution of left lower lobe pulmonary nodule, no other lung abnormality   Low-dose screening CT 10/20/2020-new 5.9 mm right lower lobe pulmonary nodule, emphysema, atherosclerosis, gallstones.  Low-dose screening CT 04/24/2021-improvement of right lower lobe pulmonary nodule, emphysema  Chest x-ray 06/30/2021-bilateral infiltrates Chest x-ray 07/12/2021-improvement in infiltrates Chest x-ray 08/13/2021-clear lungs Chest x-ray 01/10/2022-reticular opacities in the lung.   Chest x-ray 05/28/2022-chronic hyperinflation, no infiltrate. I have reviewed the images personally.   PFTs (07/21/15) FVC 2.06 66%), FEV1 0.93 (39%), F/F 45, DLCO 36% Unable complete pleth. Severe obstructive lung disease, severe diffusion impairment  Spirometry 04/12/16 FVC 1.79 [5 7%), FEV1 0.65 (27%), F/F 36 Very severe obstructive airway disease  Labs: CBC 12/03/2018-WBC 9.2, eos 0% ID 10/16/18-1931 Alpha-1 antitrypsin 11/05/2018-171, PI MM  Sputum culture  05/29/2022-Pseudomonas  Assessment:  Severe COPD,Gold Stage D, Chronic bronchitis Continues on Breo and Spiriva.   I am not sure if she is generating enough inspiratory flow for the inhalers.  We discussed switching over to nebulizers but she would like to hold off on this change for now. Has not tolerated Daliresp due to side effects Continue supplemental oxygen She has finished pulmonary rehab 2 times already.  Encouraged her to stay active at home  Unfortunately she is suffering with recurrent exacerbations over the past year.  On daily azithromycin Continue flutter valve daily and hypertonic saline to help with mucociliary clearance  Pulmonary nodule Continue annual low-dose screening CT  Goals of care We discussed her overall worsening respiratory status and frequent exacerbations.  Palliative care visit is pending. Does not want to consider DNI yet but does say that she would not want to be supported long-term if there is no recovery  Plan/Recommendations: - Continue Breo, Spiriva - Daily azithromycin, prednisone - Cough syrup - Flutter valve, hypertonic saline  Marshell Garfinkel MD [facility]  07/05/2022, 11:46 AM  CC: [name], MD

## 2022-07-05 NOTE — Patient Instructions (Signed)
I am glad you are stable with regard to your breathing Continue the inhalers, azithromycin and prednisone as prescribed Follow-up in 3 to 4 months with video visit or in person visit with me or APP

## 2022-07-09 ENCOUNTER — Other Ambulatory Visit: Payer: Self-pay | Admitting: Family Medicine

## 2022-07-09 DIAGNOSIS — M4856XA Collapsed vertebra, not elsewhere classified, lumbar region, initial encounter for fracture: Secondary | ICD-10-CM

## 2022-07-09 DIAGNOSIS — M810 Age-related osteoporosis without current pathological fracture: Secondary | ICD-10-CM

## 2022-07-09 MED ORDER — ALENDRONATE SODIUM 70 MG PO TABS: 70.0000 mg | ORAL_TABLET | ORAL | 6 refills | Status: DC

## 2022-07-10 ENCOUNTER — Other Ambulatory Visit (HOSPITAL_BASED_OUTPATIENT_CLINIC_OR_DEPARTMENT_OTHER): Payer: Self-pay

## 2022-07-12 DIAGNOSIS — Z1211 Encounter for screening for malignant neoplasm of colon: Secondary | ICD-10-CM | POA: Diagnosis not present

## 2022-07-16 ENCOUNTER — Encounter: Payer: Self-pay | Admitting: Orthopedic Surgery

## 2022-07-16 ENCOUNTER — Ambulatory Visit (INDEPENDENT_AMBULATORY_CARE_PROVIDER_SITE_OTHER): Payer: Medicare HMO | Admitting: Orthopedic Surgery

## 2022-07-16 ENCOUNTER — Ambulatory Visit: Payer: Medicare HMO | Admitting: Podiatry

## 2022-07-16 ENCOUNTER — Ambulatory Visit (INDEPENDENT_AMBULATORY_CARE_PROVIDER_SITE_OTHER): Payer: Medicare HMO

## 2022-07-16 VITALS — BP 180/79 | HR 125 | Ht 64.0 in | Wt 118.0 lb

## 2022-07-16 DIAGNOSIS — M545 Low back pain, unspecified: Secondary | ICD-10-CM

## 2022-07-16 NOTE — Progress Notes (Signed)
Orthopedic Spine Surgery Office Note  Assessment: Patient is a 71 y.o. female with atraumatic L1 compression fracture in the setting of long smoking history and a lung nodule   Plan: -I told her that she has a compression fracture at L1 and what looks like an old compression fracture at T12.  I explained that the fact that this occurred atraumatically and with her long history of smoking, there is a concern that this is possibly due to tumor -I want to see her back in 4 weeks to see how she is doing.  At that time, we will get repeat x-rays and may elect to do an MRI if there is still concern about tumor -She has been prescribed osteoporosis medication by her PCP which I agree with since compression fracture gives her a diagnosis of osteoporosis -I recommended ibuprofen and Tylenol.  She can also try Voltaren gel -Patient should return to office in 4 weeks, repeat x-rays of lumbar spine at next visit: thoracolumbar AP and lateral   Patient expressed understanding of the plan and all questions were answered to their satisfaction.   ___________________________________________________________________________   History:  Patient is a 71 y.o. female who presents today for lumbar spine.  Patient states that she was sitting on the toilet about 3 weeks ago and got up and noted acute onset of low back pain.  She states that she may have twisted wrong.  She feels the pain in her lower back and in the paraspinal muscles immediately adjacent to it.  She has no pain that radiates down the leg.  She has been having difficulty ambulating or doing any activity due to the pain.  She has a long history of urinary incontinence with no recent changes.  No bowel incontinence.  She gets some relief with laying down and sitting down.  Has a long history of smoking and a nodule that her PCP has been monitoring.  Has a history of skin cancer.   Weakness: denies Symptoms of imbalance: denies Paresthesias and  numbness: sometimes in her feet but improves when she stands up or gets off of her buttocks Bowel or bladder incontinence: has long history of urinary incontinence that is unchanged, no bowel incontinence Saddle anesthesia: denies  Treatments tried: tylenol, activity modifications, topical medications  Review of systems: Denies fevers and chills, night sweats, unexplained weight loss. Has had pain from this injury that wakes her at night.   Past medical history: CAD Aortic insufficiency COPD on 3L Allergic rhinitis  Lung nodule GERD Colon polyps Thyroid nodule Overactive bladder HLD Vit D deficiency Osteoporosis B12 deficiency Iron deficiency  Allergies: propoxyphene, hydrocodone  Past surgical history:  Hysterectomy Appendectomy Dental surgery Nasal septum surgery Tubal ligation  Social history: Denies use of nicotine product (smoking, vaping, patches, smokeless), former smoker Alcohol use: denies Denies recreational drug use    Physical Exam:  General: no acute distress, appears stated age Neurologic: alert, answering questions appropriately, following commands Respiratory: unlabored breathing on room air, symmetric chest rise Psychiatric: appropriate affect, normal cadence to speech   MSK (spine):  -Strength exam      Left  Right EHL    5/5  5/5 TA    5/5  5/5 GSC    5/5  5/5 Knee extension  5/5  5/5 Hip flexion   5/5  5/5  -Sensory exam    Sensation intact to light touch in L3-S1 nerve distributions of bilateral lower extremities  -Achilles DTR: 1/4 on the left, 1/4 on the  right -Patellar tendon DTR: 1/4 on the left, 1/4 on the right  -Seated straight leg raise: negative -Contralateral seated straight leg raise: negative -Clonus: no beats bilaterally  -Left hip exam: no pain through range of motion, negative stinchfield -Right hip exam: no pain through range of motion, negative stinchfield  Imaging: XR of the lumbar spine from 07/16/2022  was independently reviewed and interpreted, showing L1 compression fracture with anterior height loss.no other acute osseous abnormalities.   XR of the lumbar spine from 07/05/2022 was independently reviewed and interpreted, showing an irregularity at the superior endplate of L1 that may represent a compression fracture but the diaphragm is at that level which makes it difficult to interpret. No other acute osseous abnormalities.   CT from 04/28/2022 does not show any compression fracture, osteoblastic or osteolytic lesions within the L1 vertebra. There is anterior height loss at T12 with sclerosis on that endplate so this may represent a chronic compression fracture.    Patient name: Margaret Henry Patient MRN: [id number] Date of visit: 07/16/22

## 2022-07-18 LAB — COLOGUARD: COLOGUARD: POSITIVE — AB

## 2022-07-19 ENCOUNTER — Other Ambulatory Visit: Payer: Self-pay | Admitting: Family Medicine

## 2022-07-19 ENCOUNTER — Encounter: Payer: Self-pay | Admitting: Nurse Practitioner

## 2022-07-19 DIAGNOSIS — R195 Other fecal abnormalities: Secondary | ICD-10-CM

## 2022-07-25 ENCOUNTER — Ambulatory Visit (INDEPENDENT_AMBULATORY_CARE_PROVIDER_SITE_OTHER): Payer: Medicare HMO | Admitting: Podiatry

## 2022-07-25 DIAGNOSIS — M79674 Pain in right toe(s): Secondary | ICD-10-CM

## 2022-07-25 DIAGNOSIS — M79675 Pain in left toe(s): Secondary | ICD-10-CM | POA: Diagnosis not present

## 2022-07-25 DIAGNOSIS — B351 Tinea unguium: Secondary | ICD-10-CM | POA: Diagnosis not present

## 2022-07-25 NOTE — Progress Notes (Signed)
   Chief Complaint  Patient presents with   Nail Problem    Patient is here for discoloration of the toe nails that she states tat she has had for 2-3 months.    SUBJECTIVE Patient presents to office today complaining of elongated, thickened nails that cause pain while ambulating in shoes.  Patient is unable to trim their own nails. Patient is here for further evaluation and treatment.  Past Medical History:  Diagnosis Date   Allergy    hayfever   Anemia 04/06/22   Blood transfusion without reported diagnosis 6/23   Bronchitis    Cancer ([place])    skin cancer on chest   Cataract 5/23   Chest pain 06/12/2016   Colon polyps    Complication of anesthesia    pt states was given too much during nasal surgery 1989; difficulty getting awake   Complication of anesthesia 05/16/2021   pt states was given too much during nasal surgery 1989; difficulty getting awake   COPD (chronic obstructive pulmonary disease) ([place])    COPD exacerbation ([place]) 06/01/2013   Coronary artery calcification    Diverticulitis    Diverticulitis 05/16/2021   occasional   Dyspnea 06/27/2016   Emphysema of lung ([facility])    GERD (gastroesophageal reflux disease)    occasional   Hemorrhoids    High cholesterol    Hyperlipidemia LDL goal <70    Mild aortic insufficiency    Osteoporosis    Oxygen deficiency    Pneumonia    Shortness of breath dyspnea    with exertion    Thyroid goiter    bx benign   Tobacco abuse    Vertigo    Vertigo 04/21/2017    Allergies  Allergen Reactions   Propoxyphene Nausea And Vomiting   Hydrocodone Nausea And Vomiting   Propoxyphene N-Acetaminophen Nausea And Vomiting     OBJECTIVE General Patient is awake, alert, and oriented x 3 and in no acute distress. Derm Skin is dry and supple bilateral. Negative open lesions or macerations. Remaining integument unremarkable. Nails are tender, long, thickened and dystrophic with subungual debris, consistent with onychomycosis, 1-5  bilateral. No signs of infection noted. Vasc  DP and PT pedal pulses palpable bilaterally. Temperature gradient within normal limits.  Neuro Epicritic and protective threshold sensation grossly intact bilaterally.  Musculoskeletal Exam No symptomatic pedal deformities noted bilateral. Muscular strength within normal limits.  ASSESSMENT 1.  Pain due to onychomycosis of toenails both  PLAN OF CARE 1. Patient evaluated today.  2. Instructed to maintain good pedal hygiene and foot care.  3. Mechanical debridement of nails 1-5 bilaterally performed using a nail nipper. Filed with dremel without incident.  4.  Continue oral Lamisil that was prescribed from her PCP  5.  OTC Tolcylen antifungal topical dispensed at checkout  6.  Return to clinic in 3 mos.    Edrick Kins, DPM [facility]  Dr. Edrick Kins, DPM    [address], [place] [address]                Office [phone]  Fax [phone]

## 2022-07-26 ENCOUNTER — Other Ambulatory Visit (HOSPITAL_BASED_OUTPATIENT_CLINIC_OR_DEPARTMENT_OTHER): Payer: Self-pay

## 2022-07-27 ENCOUNTER — Ambulatory Visit (HOSPITAL_BASED_OUTPATIENT_CLINIC_OR_DEPARTMENT_OTHER)
Admission: RE | Admit: 2022-07-27 | Discharge: 2022-07-27 | Disposition: A | Discharge status: Home / Self Care | Payer: Medicare HMO | Source: Ambulatory Visit | Attending: Acute Care | Admitting: Acute Care

## 2022-07-27 ENCOUNTER — Other Ambulatory Visit (HOSPITAL_BASED_OUTPATIENT_CLINIC_OR_DEPARTMENT_OTHER): Payer: Self-pay

## 2022-07-27 DIAGNOSIS — Z87891 Personal history of nicotine dependence: Secondary | ICD-10-CM | POA: Diagnosis not present

## 2022-07-27 DIAGNOSIS — J432 Centrilobular emphysema: Secondary | ICD-10-CM | POA: Diagnosis not present

## 2022-07-27 DIAGNOSIS — R911 Solitary pulmonary nodule: Secondary | ICD-10-CM

## 2022-08-01 ENCOUNTER — Other Ambulatory Visit (HOSPITAL_BASED_OUTPATIENT_CLINIC_OR_DEPARTMENT_OTHER): Payer: Self-pay

## 2022-08-01 ENCOUNTER — Telehealth: Payer: Self-pay | Admitting: Orthopedic Surgery

## 2022-08-01 MED ORDER — TRAMADOL HCL 50 MG PO TABS
50.0000 mg | ORAL_TABLET | Freq: Four times a day (QID) | ORAL | 0 refills | Status: AC | PRN
  Filled 2022-08-01: qty 20, 5d supply, fill #0

## 2022-08-01 MED ORDER — ACETAMINOPHEN-CODEINE 300-30 MG PO TABS
1.0000 | ORAL_TABLET | ORAL | 0 refills | Status: DC | PRN
  Filled 2022-08-01: qty 30, 5d supply, fill #0

## 2022-08-01 NOTE — Telephone Encounter (Signed)
Patient called advised she hurt her back again last night bending over putting some trash in the trash can. Patient said she has taken 6 tylenol since 6:30 this morning. Patient asked if something could be called into the pharmacy for pain. Patient asked if a cortisone injection would work. Patient said she would try anything to get the pain to stop. Hydrocodone and anything with Codeine in it. The number to contact patient is [phone]

## 2022-08-01 NOTE — Telephone Encounter (Signed)
Pt called stating Dr Laurance Flatten called in Tylenol 3 for her. Pt states she is allergic to codine and can not take those meds. Please send a new script into pharmacy on file. Pt phone number is [phone]

## 2022-08-01 NOTE — Telephone Encounter (Signed)
IC LMVM 

## 2022-08-02 ENCOUNTER — Telehealth: Payer: Self-pay | Admitting: Orthopedic Surgery

## 2022-08-02 ENCOUNTER — Other Ambulatory Visit (HOSPITAL_BASED_OUTPATIENT_CLINIC_OR_DEPARTMENT_OTHER): Payer: Self-pay

## 2022-08-02 ENCOUNTER — Telehealth: Payer: Self-pay | Admitting: Acute Care

## 2022-08-02 ENCOUNTER — Other Ambulatory Visit: Payer: Self-pay

## 2022-08-02 DIAGNOSIS — R911 Solitary pulmonary nodule: Secondary | ICD-10-CM

## 2022-08-02 DIAGNOSIS — Z87891 Personal history of nicotine dependence: Secondary | ICD-10-CM

## 2022-08-02 NOTE — Telephone Encounter (Signed)
Order placed for 3 month nodule follow up LCS CT and results/plan faxed to PCP

## 2022-08-02 NOTE — Telephone Encounter (Signed)
Orthopedic Telephone Note  Patient has an L1 compression fracture. I spoke to her this afternoon. She notes that her pain had been getting better but then she bent over to pick up some trash and noted worsening of her low back pain. She also has pain in her calves. Pain improves in her calves if she sits down. Her back pain is about the same though. She has no noticed any weakness. No bowel or bladder incontinence. No saddle anesthesia. No numbness or paresthesias. She has been doing tylenol and topical creams. She has not tried the tramadol that I prescribed. I told her to take that as prescribed to help with the severe pain. I would expect the pain to decrease in the next couple of days. I told her that her leg symptoms could be coming from her back and if they persist, I would get an MRI to evaluate for stenosis. I explained that she should talk to me or go to ER if she develops weakness, incontinence, new numbness or paresthesias.   We also talked about osteoporosis and she said her primary care doctor is treating her for that.  She expressed understanding of the plan. All her questions were answered to her satisfaction. She is scheduled to see me on  11/06.   Callie Fielding Orthopedic Spine Surgeon

## 2022-08-02 NOTE — Telephone Encounter (Signed)
Pt called requesting a call back from dr. Laurance Flatten Pt states she is in extreme pain and son has been putting cream on her back and her son noticed lumps and bruising. Please call pt about this matter at [phone].

## 2022-08-02 NOTE — Telephone Encounter (Signed)
I have called the patient with the results of her low-dose screening CT.  I explained her scan has been read as a lung RADS 4A, suspicious with a 57-monthfollow-up suggested. This was a 381-monthollow-up for Feb 11, 2022 scan which showed a 7.2 mm left lower lobe subpleural nodule which has subsequently resolved. There is a new 7.4 mm left upper lobe nodule that will require an additional 3-20-monthllow-up. Patient verbalized understanding and agrees to follow-up scan in 3 months. There was also notation of a severe L1 compression fracture initially noted 07/16/2022.  She is followed by orthopedics for that at present time.  She states this is causing her some pain at present and she has follow-up scheduled with her orthopedist. [address]angley [address] place order for 3-m73-month-dose CT screening follow-up and fax results to PCP letting them know I will get a repeat CT in 3 months. Thank you so much

## 2022-08-06 ENCOUNTER — Other Ambulatory Visit (HOSPITAL_BASED_OUTPATIENT_CLINIC_OR_DEPARTMENT_OTHER): Payer: Self-pay

## 2022-08-09 ENCOUNTER — Other Ambulatory Visit (HOSPITAL_BASED_OUTPATIENT_CLINIC_OR_DEPARTMENT_OTHER): Payer: Self-pay

## 2022-08-10 ENCOUNTER — Other Ambulatory Visit: Payer: Self-pay | Admitting: Family Medicine

## 2022-08-10 ENCOUNTER — Other Ambulatory Visit (HOSPITAL_BASED_OUTPATIENT_CLINIC_OR_DEPARTMENT_OTHER): Payer: Self-pay

## 2022-08-10 MED ORDER — VITAMIN D (ERGOCALCIFEROL) 1.25 MG (50000 UNIT) PO CAPS
50000.0000 [IU] | ORAL_CAPSULE | ORAL | 2 refills | Status: AC
  Filled 2022-08-10: qty 4, 28d supply, fill #0
  Filled 2022-10-23: qty 4, 28d supply, fill #1
  Filled 2022-12-24: qty 4, 28d supply, fill #2

## 2022-08-10 NOTE — Telephone Encounter (Signed)
Requested medication (s) are due for refill today: yes  Requested medication (s) are on the active medication list: no  Last refill:  04/12/22 #4 2 RF  Future visit scheduled: no  Notes to clinic:  med not delegated to NT to refuse-med end date 08/08/22   Requested Prescriptions  Pending Prescriptions Disp Refills   Vitamin D, Ergocalciferol, (DRISDOL) 1.25 MG (50000 UNIT) CAPS capsule 4 capsule 2    Sig: Take 1 capsule (50,000 Units total) by mouth every 7 (seven) days.     Endocrinology:  Vitamins - Vitamin D Supplementation 2 Failed - 08/10/2022  9:54 AM      Failed - Manual Review: Route requests for 50,000 IU strength to the provider      Failed - Vitamin D in normal range and within 360 days    Vit D, 25-Hydroxy  Date Value Ref Range Status  04/08/2022 17.72 (L) 30 - 100 ng/mL Final    Comment:    (NOTE) Vitamin D deficiency has been defined by the [place] practice guideline as a level of serum 25-OH  vitamin D less than 20 ng/mL (1,2). The Endocrine Society went on to  further define vitamin D insufficiency as a level between 21 and 29  ng/mL (2).  1. IOM (Institute of Medicine). 2010. Dietary reference intakes for  calcium and D. [place]: The [employer]. 2. Holick MF, Binkley [place], Bischoff-Ferrari HA, et al. Evaluation,  treatment, and prevention of vitamin D deficiency: an Endocrine  Society clinical practice guideline, JCEM. 2011 Jul; 96(7): 1911-30.  Performed at [facility], [place] [address] [place], [place] [address]          Passed - Ca in normal range and within 360 days    Calcium  Date Value Ref Range Status  07/04/2022 9.6 8.7 - 10.3 mg/dL Final         Passed - Valid encounter within last 12 months    Recent Outpatient Visits           1 month ago Lumbar spine pain   [place], Charlane Ferretti, MD   3 months ago Anemia, unspecified type   [place], Charlane Ferretti, MD   11 months ago COPD, group D, by GOLD 2017 classification [facility])   [place], MD   1 year ago History of COVID-19   [facility], Kriste Basque, NP   1 year ago Anemia, unspecified type   [place], [place], MD       Future Appointments             In 5 days Callie Fielding, MD [facility]   In 3 months Park Liter, MD [facility]   In 4 months [name], MD [place]

## 2022-08-13 ENCOUNTER — Ambulatory Visit: Payer: Medicare HMO | Admitting: Orthopedic Surgery

## 2022-08-14 NOTE — Telephone Encounter (Signed)
(  3:16 pm) PC SW completed a telephone call to patient's son-Michael to discuss patient status and program changes. Legrand Como advised that patient has been doing fine. She continues to have breathing issues, but it is being managed. SW advised Legrand Como that because patient's status appears stable, palliative care will sign off at this time. He verbalized understanding. SW encouraged him to call with changes in patient's condition/decline. No other concerns noted.   *Palliative care will sign off as part of patient's care at this time.

## 2022-08-15 ENCOUNTER — Other Ambulatory Visit (HOSPITAL_BASED_OUTPATIENT_CLINIC_OR_DEPARTMENT_OTHER): Payer: Self-pay

## 2022-08-15 ENCOUNTER — Ambulatory Visit (INDEPENDENT_AMBULATORY_CARE_PROVIDER_SITE_OTHER): Payer: Medicare HMO | Admitting: Orthopedic Surgery

## 2022-08-15 ENCOUNTER — Ambulatory Visit (INDEPENDENT_AMBULATORY_CARE_PROVIDER_SITE_OTHER): Payer: Medicare HMO

## 2022-08-15 DIAGNOSIS — M545 Low back pain, unspecified: Secondary | ICD-10-CM

## 2022-08-15 MED ORDER — TRAMADOL HCL 50 MG PO TABS
50.0000 mg | ORAL_TABLET | Freq: Four times a day (QID) | ORAL | 0 refills | Status: AC | PRN
  Filled 2022-08-15: qty 20, 5d supply, fill #0

## 2022-08-15 MED ORDER — GABAPENTIN 100 MG PO CAPS
100.0000 mg | ORAL_CAPSULE | Freq: Three times a day (TID) | ORAL | 0 refills | Status: DC
  Filled 2022-08-15: qty 90, 30d supply, fill #0

## 2022-08-15 NOTE — Progress Notes (Signed)
Orthopedic Spine Surgery Office Note  Assessment: Patient is a 71 y.o. female with low back pain who I had initially seen for a L1 compression fracture.  On her radiographs today though, she has the appearance of a new L3 compression fracture that could explain her recent increase in low back pain   Plan: -Since she has a new fracture with worsening pain, I prescribed her gabapentin and refilled tramadol.  I also gave her a intramuscular Toradol injection in the office. -She is getting treated with Fosamax for her osteoporosis.  We discussed work-up of secondary osteoporosis (hyperparathyroidism, hypercortisolism, hyperparathyroidism, renal failure, etc).  With her primary care provider since she now has a new compression fracture with no trauma at L3. -Patient has tried tramadol, activity modification, Tylenol, topical medications -We discussed kyphoplasty as a potential option should her pain not get better.  She is not interested in any procedural intervention at this time -If her pain fails to get better by the next visit in 1 month, I would recommend an MRI of the lumbar spine.  We discussed getting an MRI at today's visit but she was not wanting to do that -Patient should return to office in 4 weeks, repeat x-rays of lumbar spine at next visit: AP/lateral lumbar films  Injection note: After discussing the risks, benefits, and alternatives of a intramuscular Toradol injection, patient elected to proceed with the injection.  The left gluteus muscle was sterilized with an alcohol based prep.  The area was anesthetized with ethyl chloride.  A 30 mg intramuscular injection of Toradol was performed of the left gluteus muscle under standard sterile technique.  Needle was withdrawn and a Band-Aid was applied over the area.  Patient tolerated the injection well.   Patient expressed understanding of the plan and all questions were answered to the patient's satisfaction.    ___________________________________________________________________________  History: Patient is a 71 y.o. female who has been previously seen in the office for symptoms consistent with L1 compression fracture.  She had been doing better after I last saw her but then noticed recent worsening of her pain.  There was no injury or trauma that brought on this acute worsening of pain.  She states that she feels it is worse if she bends over, coughs, or really does any activity.  If she sits down in the right position it does feel better but does not completely go away.  The tramadol has helped.  Tylenol does not seem to be helping.  She has had some chronic pain in her calves but no new pain radiating down her legs.  She has a long history of urinary incontinence with no recent changes.  No bowel incontinence.  No new weakness.  Previous treatments: Tramadol, Tylenol, activity modification, topical medication  COPY OF PRIOR NOTE Patient is a 71 y.o. female who presents today for lumbar spine.  Patient states that she was sitting on the toilet about 3 weeks ago and got up and noted acute onset of low back pain.  She states that she may have twisted wrong.  She feels the pain in her lower back and in the paraspinal muscles immediately adjacent to it.  She has no pain that radiates down the leg.  She has been having difficulty ambulating or doing any activity due to the pain.  She has a long history of urinary incontinence with no recent changes.  No bowel incontinence.  She gets some relief with laying down and sitting down.  Has a long  history of smoking and a nodule that her PCP has been monitoring.  Has a history of skin cancer.     Weakness: denies Symptoms of imbalance: denies Paresthesias and numbness: sometimes in her feet but improves when she stands up or gets off of her buttocks Bowel or bladder incontinence: has long history of urinary incontinence that is unchanged, no bowel  incontinence Saddle anesthesia: denies END OF COPY   Physical Exam:  General: no acute distress, appears stated age Neurologic: alert, answering questions appropriately, following commands Respiratory: unlabored breathing on room air, symmetric chest rise Psychiatric: appropriate affect, normal cadence to speech   MSK (spine):  -Strength exam      Left  Right EHL    5/5  5/5 TA    5/5  5/5 GSC    5/5  5/5 Knee extension  5/5  5/5 Hip flexion   5/5  5/5  -Sensory exam    Sensation intact to light touch in L3-S1 nerve distributions of bilateral lower extremities  -Straight leg raise: Negative -Contralateral straight leg raise: Negative -Clonus: no beats bilaterally  Imaging: XR of the lumbar spine from 08/15/2022 was independently reviewed and interpreted, showing L1 compression fracture with an irregularity in the L3 vertebral body that appears to be new compression fracture when compared to the films obtained in office on 07/16/2022.   Patient name: Margaret Henry Patient MRN: [id number] Date of visit: 08/15/22

## 2022-08-16 ENCOUNTER — Telehealth: Payer: Self-pay | Admitting: Emergency Medicine

## 2022-08-16 DIAGNOSIS — M810 Age-related osteoporosis without current pathological fracture: Secondary | ICD-10-CM

## 2022-08-16 NOTE — Telephone Encounter (Signed)
Routing to PCP for review.

## 2022-08-16 NOTE — Telephone Encounter (Signed)
Copied from [place] [id number]. Topic: General - Inquiry >> Aug 16, 2022  8:28 AM Devoria Glassing wrote: Reason for CRM: pt states Dr Laurance Flatten, at [facility] told her to call Dr Margarita Rana and have her do some extensive blood work to find out why she is having compression fractures in her L1 and L3.  That is all the info she could give me.  She also wants to know if there is a place closer to her she can get labs.  [facility] is close to her.

## 2022-08-16 NOTE — Telephone Encounter (Signed)
Pt was called and informed that order has been placed in epic and she states that she will reach out to [place] [facility] to see if she can have the lab work done there.

## 2022-08-16 NOTE — Telephone Encounter (Signed)
I have placed orders in epic.

## 2022-08-16 NOTE — Addendum Note (Signed)
Addended by: [name] on: 08/16/2022 01:56 PM   Modules accepted: Orders

## 2022-08-17 ENCOUNTER — Encounter (HOSPITAL_COMMUNITY): Payer: Self-pay

## 2022-08-17 ENCOUNTER — Inpatient Hospital Stay (HOSPITAL_COMMUNITY)
Admission: EM | Admit: 2022-08-17 | Discharge: 2022-08-20 | DRG: 191 | Disposition: A | Discharge status: Home Health | Payer: Medicare HMO | Attending: Family Medicine | Admitting: Family Medicine

## 2022-08-17 ENCOUNTER — Emergency Department (HOSPITAL_COMMUNITY): Payer: Medicare HMO

## 2022-08-17 ENCOUNTER — Other Ambulatory Visit: Payer: Self-pay

## 2022-08-17 DIAGNOSIS — R231 Pallor: Secondary | ICD-10-CM | POA: Diagnosis not present

## 2022-08-17 DIAGNOSIS — Z791 Long term (current) use of non-steroidal anti-inflammatories (NSAID): Secondary | ICD-10-CM

## 2022-08-17 DIAGNOSIS — E559 Vitamin D deficiency, unspecified: Secondary | ICD-10-CM | POA: Diagnosis present

## 2022-08-17 DIAGNOSIS — R000 Tachycardia, unspecified: Secondary | ICD-10-CM | POA: Diagnosis not present

## 2022-08-17 DIAGNOSIS — Z87891 Personal history of nicotine dependence: Secondary | ICD-10-CM

## 2022-08-17 DIAGNOSIS — R350 Frequency of micturition: Secondary | ICD-10-CM | POA: Diagnosis present

## 2022-08-17 DIAGNOSIS — K219 Gastro-esophageal reflux disease without esophagitis: Secondary | ICD-10-CM | POA: Diagnosis not present

## 2022-08-17 DIAGNOSIS — G8929 Other chronic pain: Secondary | ICD-10-CM | POA: Diagnosis present

## 2022-08-17 DIAGNOSIS — Z792 Long term (current) use of antibiotics: Secondary | ICD-10-CM | POA: Diagnosis not present

## 2022-08-17 DIAGNOSIS — J441 Chronic obstructive pulmonary disease with (acute) exacerbation: Secondary | ICD-10-CM | POA: Diagnosis not present

## 2022-08-17 DIAGNOSIS — R339 Retention of urine, unspecified: Secondary | ICD-10-CM | POA: Insufficient documentation

## 2022-08-17 DIAGNOSIS — R42 Dizziness and giddiness: Secondary | ICD-10-CM | POA: Diagnosis not present

## 2022-08-17 DIAGNOSIS — Z811 Family history of alcohol abuse and dependence: Secondary | ICD-10-CM | POA: Diagnosis not present

## 2022-08-17 DIAGNOSIS — J9611 Chronic respiratory failure with hypoxia: Secondary | ICD-10-CM | POA: Diagnosis present

## 2022-08-17 DIAGNOSIS — Z7952 Long term (current) use of systemic steroids: Secondary | ICD-10-CM

## 2022-08-17 DIAGNOSIS — M4856XA Collapsed vertebra, not elsewhere classified, lumbar region, initial encounter for fracture: Secondary | ICD-10-CM | POA: Diagnosis not present

## 2022-08-17 DIAGNOSIS — Z1152 Encounter for screening for COVID-19: Secondary | ICD-10-CM

## 2022-08-17 DIAGNOSIS — E7800 Pure hypercholesterolemia, unspecified: Secondary | ICD-10-CM | POA: Diagnosis present

## 2022-08-17 DIAGNOSIS — Z885 Allergy status to narcotic agent status: Secondary | ICD-10-CM

## 2022-08-17 DIAGNOSIS — Z800 Family history of malignant neoplasm of digestive organs: Secondary | ICD-10-CM

## 2022-08-17 DIAGNOSIS — J439 Emphysema, unspecified: Secondary | ICD-10-CM | POA: Diagnosis not present

## 2022-08-17 DIAGNOSIS — M810 Age-related osteoporosis without current pathological fracture: Secondary | ICD-10-CM | POA: Diagnosis present

## 2022-08-17 DIAGNOSIS — J9621 Acute and chronic respiratory failure with hypoxia: Secondary | ICD-10-CM | POA: Diagnosis not present

## 2022-08-17 DIAGNOSIS — Z833 Family history of diabetes mellitus: Secondary | ICD-10-CM

## 2022-08-17 DIAGNOSIS — Z8249 Family history of ischemic heart disease and other diseases of the circulatory system: Secondary | ICD-10-CM

## 2022-08-17 DIAGNOSIS — S32010A Wedge compression fracture of first lumbar vertebra, initial encounter for closed fracture: Secondary | ICD-10-CM | POA: Diagnosis not present

## 2022-08-17 DIAGNOSIS — E785 Hyperlipidemia, unspecified: Secondary | ICD-10-CM | POA: Diagnosis not present

## 2022-08-17 DIAGNOSIS — R0602 Shortness of breath: Secondary | ICD-10-CM | POA: Diagnosis not present

## 2022-08-17 DIAGNOSIS — Z9981 Dependence on supplemental oxygen: Secondary | ICD-10-CM | POA: Diagnosis not present

## 2022-08-17 DIAGNOSIS — Z79899 Other long term (current) drug therapy: Secondary | ICD-10-CM | POA: Diagnosis not present

## 2022-08-17 DIAGNOSIS — Z801 Family history of malignant neoplasm of trachea, bronchus and lung: Secondary | ICD-10-CM

## 2022-08-17 DIAGNOSIS — Z825 Family history of asthma and other chronic lower respiratory diseases: Secondary | ICD-10-CM | POA: Diagnosis not present

## 2022-08-17 DIAGNOSIS — Z7951 Long term (current) use of inhaled steroids: Secondary | ICD-10-CM

## 2022-08-17 DIAGNOSIS — Z7982 Long term (current) use of aspirin: Secondary | ICD-10-CM

## 2022-08-17 DIAGNOSIS — Z7983 Long term (current) use of bisphosphonates: Secondary | ICD-10-CM

## 2022-08-17 DIAGNOSIS — Z85828 Personal history of other malignant neoplasm of skin: Secondary | ICD-10-CM | POA: Diagnosis not present

## 2022-08-17 DIAGNOSIS — M545 Low back pain, unspecified: Secondary | ICD-10-CM | POA: Diagnosis present

## 2022-08-17 DIAGNOSIS — Z888 Allergy status to other drugs, medicaments and biological substances status: Secondary | ICD-10-CM

## 2022-08-17 DIAGNOSIS — Z90711 Acquired absence of uterus with remaining cervical stump: Secondary | ICD-10-CM

## 2022-08-17 DIAGNOSIS — R0689 Other abnormalities of breathing: Secondary | ICD-10-CM | POA: Diagnosis not present

## 2022-08-17 DIAGNOSIS — R338 Other retention of urine: Secondary | ICD-10-CM | POA: Insufficient documentation

## 2022-08-17 LAB — COMPREHENSIVE METABOLIC PANEL
ALT: 10 U/L (ref 0–44)
AST: 15 U/L (ref 15–41)
Albumin: 3.4 g/dL — ABNORMAL LOW (ref 3.5–5.0)
Alkaline Phosphatase: 92 U/L (ref 38–126)
Anion gap: 5 (ref 5–15)
BUN: 12 mg/dL (ref 8–23)
CO2: 28 mmol/L (ref 22–32)
Calcium: 8.4 mg/dL — ABNORMAL LOW (ref 8.9–10.3)
Chloride: 108 mmol/L (ref 98–111)
Creatinine, Ser: 0.47 mg/dL (ref 0.44–1.00)
GFR, Estimated: >60 mL/min (ref >60)
Glucose, Bld: 109 mg/dL — ABNORMAL HIGH (ref 70–99)
Potassium: 3.5 mmol/L (ref 3.5–5.1)
Sodium: 141 mmol/L (ref 135–145)
Total Bilirubin: 0.6 mg/dL (ref 0.3–1.2)
Total Protein: 6.5 g/dL (ref 6.5–8.1)

## 2022-08-17 LAB — URINALYSIS, ROUTINE W REFLEX MICROSCOPIC
Bilirubin Urine: NEGATIVE
Glucose, UA: NEGATIVE mg/dL
Ketones, ur: 5 mg/dL — AB
Leukocytes,Ua: NEGATIVE
Nitrite: NEGATIVE
Protein, ur: NEGATIVE mg/dL
Specific Gravity, Urine: 1.01 (ref 1.005–1.030)
pH: 6 (ref 5.0–8.0)

## 2022-08-17 LAB — CBC WITH DIFFERENTIAL/PLATELET
Abs Immature Granulocytes: 0.04 10*3/uL (ref 0.00–0.07)
Basophils Absolute: 0.1 10*3/uL (ref 0.0–0.1)
Basophils Relative: 1 %
Eosinophils Absolute: 0.2 10*3/uL (ref 0.0–0.5)
Eosinophils Relative: 2 %
HCT: 36 % (ref 36.0–46.0)
Hemoglobin: 11.5 g/dL — ABNORMAL LOW (ref 12.0–15.0)
Immature Granulocytes: 0 %
Lymphocytes Relative: 7 %
Lymphs Abs: 0.7 10*3/uL (ref 0.7–4.0)
MCH: 32.1 pg (ref 26.0–34.0)
MCHC: 31.9 g/dL (ref 30.0–36.0)
MCV: 100.6 fL — ABNORMAL HIGH (ref 80.0–100.0)
Monocytes Absolute: 0.9 10*3/uL (ref 0.1–1.0)
Monocytes Relative: 9 %
Neutro Abs: 8.3 10*3/uL — ABNORMAL HIGH (ref 1.7–7.7)
Neutrophils Relative %: 81 %
Platelets: 290 10*3/uL (ref 150–400)
RBC: 3.58 MIL/uL — ABNORMAL LOW (ref 3.87–5.11)
RDW: 13.2 % (ref 11.5–15.5)
WBC: 10.2 10*3/uL (ref 4.0–10.5)
nRBC: 0 % (ref 0.0–0.2)

## 2022-08-17 LAB — RESP PANEL BY RT-PCR (FLU A&B, COVID) ARPGX2
Influenza A by PCR: NEGATIVE
Influenza B by PCR: NEGATIVE
SARS Coronavirus 2 by RT PCR: NEGATIVE

## 2022-08-17 LAB — CBG MONITORING, ED: Glucose-Capillary: 97 mg/dL (ref 70–99)

## 2022-08-17 MED ORDER — IPRATROPIUM BROMIDE 0.02 % IN SOLN
0.5000 mg | Freq: Once | RESPIRATORY_TRACT | Status: AC
  Administered 2022-08-17: 0.5 mg via RESPIRATORY_TRACT
  Filled 2022-08-17: qty 2.5

## 2022-08-17 MED ORDER — SODIUM CHLORIDE 0.9 % IV BOLUS
500.0000 mL | Freq: Once | INTRAVENOUS | Status: AC
  Administered 2022-08-17: 500 mL via INTRAVENOUS

## 2022-08-17 MED ORDER — METHYLPREDNISOLONE SODIUM SUCC 125 MG IJ SOLR
125.0000 mg | Freq: Once | INTRAMUSCULAR | Status: AC
  Administered 2022-08-17: 125 mg via INTRAVENOUS
  Filled 2022-08-17: qty 2

## 2022-08-17 MED ORDER — ALBUTEROL SULFATE (2.5 MG/3ML) 0.083% IN NEBU
5.0000 mg | INHALATION_SOLUTION | Freq: Once | RESPIRATORY_TRACT | Status: AC
  Administered 2022-08-17: 5 mg via RESPIRATORY_TRACT
  Filled 2022-08-17: qty 6

## 2022-08-17 NOTE — ED Provider Notes (Signed)
[facility] DEPT Provider Note   CSN: [id number] Arrival date & time: 08/17/22  9485     History  Chief Complaint  Patient presents with   Shortness of Breath    Margaret Henry is a 71 y.o. female.   Shortness of Breath Patient with severe COPD at baseline.  On chronic oxygen.  States that her oxygen will desat to the upper 80s at baseline over the last few months.  States more shortness of breath over the last couple week.  States she felt worse today and felt dizzy trying to get up.  Has had a cough with white sputum production but that is chronic for her.  No sick contacts.  Is on chronic steroids and chronic antibiotics.    Past Medical History:  Diagnosis Date   Allergy    hayfever   Anemia 04/06/22   Blood transfusion without reported diagnosis 6/23   Bronchitis    Cancer ([place])    skin cancer on chest   Cataract 5/23   Chest pain 06/12/2016   Colon polyps    Complication of anesthesia    pt states was given too much during nasal surgery 1989; difficulty getting awake   Complication of anesthesia 05/16/2021   pt states was given too much during nasal surgery 1989; difficulty getting awake   COPD (chronic obstructive pulmonary disease) ([place])    COPD exacerbation ([place]) 06/01/2013   Coronary artery calcification    Diverticulitis    Diverticulitis 05/16/2021   occasional   Dyspnea 06/27/2016   Emphysema of lung ([facility])    GERD (gastroesophageal reflux disease)    occasional   Hemorrhoids    High cholesterol    Hyperlipidemia LDL goal <70    Mild aortic insufficiency    Osteoporosis    Oxygen deficiency    Pneumonia    Shortness of breath dyspnea    with exertion    Thyroid goiter    bx benign   Tobacco abuse    Vertigo    Vertigo 04/21/2017    Home Medications Prior to Admission medications   Medication Sig Start Date End Date Taking? Authorizing Provider  acetaminophen (TYLENOL) 500 MG tablet Take 1,500 mg by mouth  every 4 (four) hours as needed for headache.    Yes [provider]  albuterol (PROVENTIL) (2.5 MG/3ML) 0.083% nebulizer solution Take 2.5 mg by nebulization every 6 (six) hours as needed for wheezing or shortness of breath.   Yes [provider]  alendronate (FOSAMAX) 70 MG tablet Take 1 tablet (70 mg total) by mouth every 7 (seven) days. Take with a full glass of water on an empty stomach. Patient taking differently: Take 70 mg by mouth every 7 (seven) days. Take with a full glass of water on an empty stomach. Sunday 07/09/22  Yes [name], MD  aspirin EC (ASPIRIN ADULT LOW STRENGTH) 81 MG tablet Take 1 tablet (81 mg total) by mouth at bedtime. Patient taking differently: Take 81 mg by mouth every other day. 04/26/22  Yes [place] Eric J, DO  atorvastatin (LIPITOR) 40 MG tablet TAKE 1 TABLET EVERY DAY Patient taking differently: Take 40 mg by mouth daily. 12/15/21  Yes Park Liter, MD  azithromycin (ZITHROMAX) 250 MG tablet Take 250 mg by mouth daily.   Yes [provider]  benzonatate (TESSALON) 200 MG capsule Take 1 capsule (200 mg total) by mouth 3 (three) times daily as needed for cough. 05/31/22  Yes Clayton Bibles, NP  bismuth subsalicylate (PEPTO BISMOL) 262 MG/15ML suspension Take 30 mLs by mouth every 6 (six) hours as needed for indigestion.   Yes [provider]  BREO ELLIPTA 200-25 MCG/ACT AEPB Inhale 1 puff into the lungs daily. 04/05/22  Yes [provider]  cyanocobalamin (VITAMIN B12) 1000 MCG tablet Take 1 tablet (1,000 mcg total) by mouth daily. 04/12/22 10/29/22 Yes [place] Eric J, DO  ferrous sulfate 325 (65 FE) MG tablet Take 1 tablet (325 mg total) by mouth 2 (two) times daily with a meal. Patient taking differently: Take 325 mg by mouth daily with breakfast. 04/12/22 08/17/22 Yes [place] Eric J, DO  fluticasone (FLONASE) 50 MCG/ACT nasal spray USE 2 SPRAYS IN EACH NOSTRIL EVERY DAY Patient taking differently: Place 2  sprays into both nostrils daily as needed for allergies. 02/11/20  Yes [name], MD  gabapentin (NEURONTIN) 100 MG capsule Take 1 capsule (100 mg total) by mouth 3 (three) times daily. 08/15/22 09/15/22 Yes Callie Fielding, MD  ipratropium-albuterol (DUONEB) 0.5-2.5 (3) MG/3ML SOLN INHALE THE CONTENTS OF 1 VIAL VIA NEBULIZER EVERY 6 HOURS AS NEEDED Patient taking differently: Take 3 mLs by nebulization every 6 (six) hours as needed (shortness of breath). 06/19/22  Yes [name], MD  loratadine (CLARITIN) 10 MG tablet Take 1 tablet (10 mg total) by mouth daily. 04/17/22  Yes [name], MD  meloxicam (MOBIC) 7.5 MG tablet Take 1 tablet (7.5 mg total) by mouth daily. 07/04/22  Yes [name], MD  mupirocin ointment (BACTROBAN) 2 % Apply 1 Application topically 2 (two) times daily. To affected nostril 05/31/22  Yes Cobb, [name], NP  OXYGEN Inhale 3 L into the lungs daily.   Yes [provider]  predniSONE (DELTASONE) 10 MG tablet Take 1 tablet (10 mg total) by mouth daily with breakfast. 04/04/22  Yes Mannam, Praveen, MD  terbinafine (LAMISIL) 250 MG tablet Take 1 tablet (250 mg total) by mouth daily. 07/04/22  Yes [name], MD  Tiotropium Bromide Monohydrate (SPIRIVA RESPIMAT) 2.5 MCG/ACT AERS INHALE 2 PUFFS EVERY DAY Patient taking differently: Take 2 each by mouth daily. 04/05/22  Yes Mannam, Praveen, MD  traMADol (ULTRAM) 50 MG tablet Take 1 tablet (50 mg total) by mouth every 6 (six) hours as needed for up to 5 days. 08/15/22 08/21/22 Yes Callie Fielding, MD  Vitamin D, Ergocalciferol, (DRISDOL) 1.25 MG (50000 UNIT) CAPS capsule Take 1 capsule (50,000 Units total) by mouth every 7 (seven) days. Patient taking differently: Take 50,000 Units by mouth every 7 (seven) days. Sunday 08/10/22 11/08/22 Yes [name], MD  RSV vaccine recomb adjuvanted (AREXVY) 120 MCG/0.5ML injection Inject into the muscle. 07/05/22         Allergies    Propoxyphene, Hydrocodone, and  Propoxyphene n-acetaminophen    Review of Systems   Review of Systems  Respiratory:  Positive for shortness of breath.     Physical Exam Updated Vital Signs BP (!) 134/57 (BP Location: Right Arm)   Pulse (!) 106   Temp 97.6 F (36.4 C) (Oral)   Resp 19   Ht '5\' 4"'$  (1.626 m)   Wt 53.5 kg   SpO2 99%   BMI 20.25 kg/m  Physical Exam Vitals reviewed.  Cardiovascular:     Rate and Rhythm: Regular rhythm. Tachycardia present.  Pulmonary:     Comments: Tachypnea with some pursed lip breathing.  Decreased breath sounds throughout. Abdominal:     Tenderness: There is no abdominal tenderness.  Musculoskeletal:     Right  lower leg: No edema.     Left lower leg: No edema.  Skin:    Capillary Refill: Capillary refill takes less than 2 seconds.  Neurological:     Mental Status: She is alert.     ED Results / Procedures / Treatments   Labs (all labs ordered are listed, but only abnormal results are displayed) Labs Reviewed  CBC WITH DIFFERENTIAL/PLATELET - Abnormal; Notable for the following components:      Result Value   RBC 3.58 (*)    Hemoglobin 11.5 (*)    MCV 100.6 (*)    Neutro Abs 8.3 (*)    All other components within normal limits  COMPREHENSIVE METABOLIC PANEL - Abnormal; Notable for the following components:   Glucose, Bld 109 (*)    Calcium 8.4 (*)    Albumin 3.4 (*)    All other components within normal limits  RESP PANEL BY RT-PCR (FLU A&B, COVID) [id number]  CBG MONITORING, ED    EKG None  Radiology DG Chest Portable 1 View  Result Date: 08/17/2022 CLINICAL DATA:  71 year old female with shortness of breath. EXAM: PORTABLE CHEST 1 VIEW COMPARISON:  Low-dose screening chest CT 07/27/2022 and earlier. FINDINGS: Portable AP semi upright view at 0907 hours. Pulmonary hyperinflation and emphysema as demonstrated on the CT last month. Calcified aortic atherosclerosis. Mediastinal contours remain normal. No pneumothorax, pulmonary edema, pleural effusion or acute  pulmonary opacity. No acute osseous abnormality identified. IMPRESSION: Emphysema (ICD10-J43.9). No acute cardiopulmonary abnormality. Electronically Signed   By: Genevie Ann M.D.   On: 08/17/2022 09:16    Procedures Procedures    Medications Ordered in ED Medications  albuterol (PROVENTIL) (2.5 MG/3ML) 0.083% nebulizer solution 5 mg (5 mg Nebulization Given 08/17/22 0859)  ipratropium (ATROVENT) nebulizer solution 0.5 mg (0.5 mg Nebulization Given 08/17/22 0858)  methylPREDNISolone sodium succinate (SOLU-MEDROL) 125 mg/2 mL injection 125 mg (125 mg Intravenous Given 08/17/22 1043)  sodium chloride 0.9 % bolus 500 mL (500 mLs Intravenous New Bag/Given 08/17/22 1042)    ED Course/ Medical Decision Making/ A&P                           Medical Decision Making Amount and/or Complexity of Data Reviewed Labs: ordered. Radiology: ordered.  Risk Prescription drug management. Decision regarding hospitalization.   Patient with shortness of breath.  Acute on chronic.  Has severe COPD at baseline.  Had mild increase in her sputum production.  States felt more dizzy today.  Reportedly sats in the 80s for EMS.  On nonrebreather upon arrival.  Will get chest x-ray give breathing treatment and COVID testing.  Differential diagnosis includes COPD, infection.  Pulm embolism felt less likely. Patient's son is here now.  States that she took Ambien for the first time last night.  Continued dyspnea.  Less dizziness than before.  X-ray shows emphysema.  Lab work overall reassuring.  However with continued dyspnea and baseline poor respiratory status with increased shortness of breath I feel she would benefit from mission to the hospital for a COPD exacerbation.  Will discuss with hospitalist.         Final Clinical Impression(s) / ED Diagnoses Final diagnoses:  COPD exacerbation [facility])    Rx / DC Orders ED Discharge Orders     None         Davonna Belling, MD 08/17/22 1309

## 2022-08-17 NOTE — ED Triage Notes (Signed)
Pt coming from home with c/o worsening SOB x2 weeks and new dizziness today. Hx COPD. 3L O2 baseline

## 2022-08-17 NOTE — ED Notes (Addendum)
Bladder scan results were 353 ml.

## 2022-08-17 NOTE — H&P (Signed)
History and Physical    Patient: Margaret Henry UJW:[id number] DOB: [birth year] DOA: 08/17/2022 DOS: the patient was seen and examined on 08/17/2022 PCP: [name], MD  Patient coming from: Home  Chief Complaint:  Chief Complaint  Patient presents with   Shortness of Breath   HPI: Margaret Henry is a 71 y.o. female with medical history significant of COPD, chronic hypoxic respiratory failure on 3L [place] at baseline, HLD, chronic back pain, GERD. Presenting with shortness of breath and increased mucous production. She's has worsening dyspnea and increased cough over the last week. Initially she was able to make things better with her nebulizer. She didn't have any fever or sick contacts. However, over the last couple of days, she's been dizzy when trying to move. When she checks her pulse ox; her heart rates are very high and her SpO2 is in the 80s. She reports that she is on chronic steroids and chronic azithromycin. She has been compliant with her medications and she denies current tobacco use. When her symptoms did not improve this morning, she decided to come to the ED for assistance. Of note, she reports that she's had increased urinary frequency lately. She is not sure if she is completely emptying when she goes to the bathroom. She hasn't had any pain or burning with urination. She denies any change in color. She otherwise denies any aggravating or alleviating factors.    Review of Systems: As mentioned in the history of present illness. All other systems reviewed and are negative. Past Medical History:  Diagnosis Date   Allergy    hayfever   Anemia 04/06/22   Blood transfusion without reported diagnosis 6/23   Bronchitis    Cancer ([place])    skin cancer on chest   Cataract 5/23   Chest pain 06/12/2016   Colon polyps    Complication of anesthesia    pt states was given too much during nasal surgery 1989; difficulty getting awake   Complication of anesthesia 05/16/2021   pt  states was given too much during nasal surgery 1989; difficulty getting awake   COPD (chronic obstructive pulmonary disease) ([place])    COPD exacerbation ([place]) 06/01/2013   Coronary artery calcification    Diverticulitis    Diverticulitis 05/16/2021   occasional   Dyspnea 06/27/2016   Emphysema of lung ([facility])    GERD (gastroesophageal reflux disease)    occasional   Hemorrhoids    High cholesterol    Hyperlipidemia LDL goal <70    Mild aortic insufficiency    Osteoporosis    Oxygen deficiency    Pneumonia    Shortness of breath dyspnea    with exertion    Thyroid goiter    bx benign   Tobacco abuse    Vertigo    Vertigo 04/21/2017   Past Surgical History:  Procedure Laterality Date   ABDOMINAL HYSTERECTOMY     APPENDECTOMY  1973   BILATERAL OOPHORECTOMY  10/09/1999   for benign ovarian mass    BIOPSY THYROID     DENTAL SURGERY     dentures   NASAL SEPTUM SURGERY     PARTIAL HYSTERECTOMY  10/08/1974   for heavy menses    RECTAL EXAM UNDER ANESTHESIA N/A 12/14/2015   Procedure: RECTAL EXAM UNDER ANESTHESIA REMOVAL OF ANAL CANAL MASS; INTERNAL HEMORRHOID LIGATION, EXTERNAL HEMORRHOID LIGATION;  Surgeon: Michael Boston, MD;  Location: [facility];  Service: General;  Laterality: N/A;   TUBAL LIGATION     WISDOM TOOTH EXTRACTION  Social History:  reports that she quit smoking about 3 years ago. Her smoking use included cigarettes. She has a 75.00 pack-year smoking history. She has never used smokeless tobacco. She reports that she does not drink alcohol and does not use drugs.  Allergies  Allergen Reactions   Propoxyphene Nausea And Vomiting   Hydrocodone Nausea And Vomiting   Propoxyphene N-Acetaminophen Nausea And Vomiting    Family History  Problem Relation Age of Onset   Heart disease Mother    Alcohol abuse Mother    Asthma Mother    COPD Father    Heart disease Father    Lung cancer Maternal Grandfather    Cancer Maternal Grandfather    Liver cancer Paternal  Grandmother    Cancer Paternal Grandmother    Diabetes Paternal Aunt    Colon cancer Neg Hx    Colon polyps Neg Hx    Esophageal cancer Neg Hx    Rectal cancer Neg Hx    Stomach cancer Neg Hx     Prior to Admission medications   Medication Sig Start Date End Date Taking? Authorizing Provider  acetaminophen (TYLENOL) 500 MG tablet Take 1,500 mg by mouth every 4 (four) hours as needed for headache.    Yes [provider]  albuterol (PROVENTIL) (2.5 MG/3ML) 0.083% nebulizer solution Take 2.5 mg by nebulization every 6 (six) hours as needed for wheezing or shortness of breath.   Yes [provider]  alendronate (FOSAMAX) 70 MG tablet Take 1 tablet (70 mg total) by mouth every 7 (seven) days. Take with a full glass of water on an empty stomach. Patient taking differently: Take 70 mg by mouth every 7 (seven) days. Take with a full glass of water on an empty stomach. Sunday 07/09/22  Yes [name], MD  aspirin EC (ASPIRIN ADULT LOW STRENGTH) 81 MG tablet Take 1 tablet (81 mg total) by mouth at bedtime. Patient taking differently: Take 81 mg by mouth every other day. 04/26/22  Yes [place] Eric J, DO  atorvastatin (LIPITOR) 40 MG tablet TAKE 1 TABLET EVERY DAY Patient taking differently: Take 40 mg by mouth daily. 12/15/21  Yes Park Liter, MD  azithromycin (ZITHROMAX) 250 MG tablet Take 250 mg by mouth daily.   Yes [provider]  benzonatate (TESSALON) 200 MG capsule Take 1 capsule (200 mg total) by mouth 3 (three) times daily as needed for cough. 05/31/22  Yes Cobb, [name], NP  bismuth subsalicylate (PEPTO BISMOL) 262 MG/15ML suspension Take 30 mLs by mouth every 6 (six) hours as needed for indigestion.   Yes [provider]  BREO ELLIPTA 200-25 MCG/ACT AEPB Inhale 1 puff into the lungs daily. 04/05/22  Yes [provider]  cyanocobalamin (VITAMIN B12) 1000 MCG tablet Take 1 tablet (1,000 mcg total) by mouth daily. 04/12/22 10/29/22 Yes  [place] Eric J, DO  ferrous sulfate 325 (65 FE) MG tablet Take 1 tablet (325 mg total) by mouth 2 (two) times daily with a meal. Patient taking differently: Take 325 mg by mouth daily with breakfast. 04/12/22 08/17/22 Yes [place] Eric J, DO  fluticasone (FLONASE) 50 MCG/ACT nasal spray USE 2 SPRAYS IN EACH NOSTRIL EVERY DAY Patient taking differently: Place 2 sprays into both nostrils daily as needed for allergies. 02/11/20  Yes [name], MD  gabapentin (NEURONTIN) 100 MG capsule Take 1 capsule (100 mg total) by mouth 3 (three) times daily. 08/15/22 09/15/22 Yes Callie Fielding, MD  ipratropium-albuterol (DUONEB) 0.5-2.5 (3) MG/3ML SOLN INHALE THE  CONTENTS OF 1 VIAL VIA NEBULIZER EVERY 6 HOURS AS NEEDED Patient taking differently: Take 3 mLs by nebulization every 6 (six) hours as needed (shortness of breath). 06/19/22  Yes [name], MD  loratadine (CLARITIN) 10 MG tablet Take 1 tablet (10 mg total) by mouth daily. 04/17/22  Yes [name], MD  meloxicam (MOBIC) 7.5 MG tablet Take 1 tablet (7.5 mg total) by mouth daily. 07/04/22  Yes [name], MD  mupirocin ointment (BACTROBAN) 2 % Apply 1 Application topically 2 (two) times daily. To affected nostril 05/31/22  Yes Cobb, [name], NP  OXYGEN Inhale 3 L into the lungs daily.   Yes [provider]  predniSONE (DELTASONE) 10 MG tablet Take 1 tablet (10 mg total) by mouth daily with breakfast. 04/04/22  Yes Mannam, Praveen, MD  terbinafine (LAMISIL) 250 MG tablet Take 1 tablet (250 mg total) by mouth daily. 07/04/22  Yes [name], MD  Tiotropium Bromide Monohydrate (SPIRIVA RESPIMAT) 2.5 MCG/ACT AERS INHALE 2 PUFFS EVERY DAY Patient taking differently: Take 2 each by mouth daily. 04/05/22  Yes Mannam, Praveen, MD  traMADol (ULTRAM) 50 MG tablet Take 1 tablet (50 mg total) by mouth every 6 (six) hours as needed for up to 5 days. 08/15/22 08/21/22 Yes Callie Fielding, MD  Vitamin D, Ergocalciferol, (DRISDOL) 1.25 MG  (50000 UNIT) CAPS capsule Take 1 capsule (50,000 Units total) by mouth every 7 (seven) days. Patient taking differently: Take 50,000 Units by mouth every 7 (seven) days. Sunday 08/10/22 11/08/22 Yes [name], MD  RSV vaccine recomb adjuvanted (AREXVY) 120 MCG/0.5ML injection Inject into the muscle. 07/05/22       Physical Exam: Vitals:   08/17/22 1103 08/17/22 1130 08/17/22 1247 08/17/22 1330  BP:  (!) 140/65 (!) 134/57 125/73  Pulse: (!) 110 (!) 118 (!) 106 (!) 103  Resp: (!) '21 10 19 16  '$ Temp:   97.6 F (36.4 C)   TempSrc:   Oral   SpO2: 99% 97% 99% 99%  Weight:      Height:       General: 71 y.o. female resting in bed in NAD Eyes: PERRL, normal sclera ENMT: Nares patent w/o discharge, orophaynx clear, dentition normal, ears w/o discharge/lesions/ulcers Neck: Supple, trachea midline Cardiovascular: tachy, +S1, S2, no m/g/r, equal pulses throughout Respiratory: decreased movement at bases, soft exp wheeze, no r/r, increased WOB on 3L GI: BS+, NDNT, no masses noted, no organomegaly noted MSK: No e/c/c Neuro: A&O x 3, no focal deficits Psyc: Appropriate interaction and affect, calm/cooperative  Data Reviewed:  Results for orders placed or performed during the hospital encounter of 08/17/22 (from the past 24 hour(s))  CBC with Differential     Status: Abnormal   Collection Time: 08/17/22  8:42 AM  Result Value Ref Range   WBC 10.2 4.0 - 10.5 K/uL   RBC 3.58 (L) 3.87 - 5.11 MIL/uL   Hemoglobin 11.5 (L) 12.0 - 15.0 g/dL   HCT 36.0 36.0 - 46.0 %   MCV 100.6 (H) 80.0 - 100.0 fL   MCH 32.1 26.0 - 34.0 pg   MCHC 31.9 30.0 - 36.0 g/dL   RDW 13.2 11.5 - 15.5 %   Platelets 290 150 - 400 K/uL   nRBC 0.0 0.0 - 0.2 %   Neutrophils Relative % 81 %   Neutro Abs 8.3 (H) 1.7 - 7.7 K/uL   Lymphocytes Relative 7 %   Lymphs Abs 0.7 0.7 - 4.0 K/uL   Monocytes Relative 9 %  Monocytes Absolute 0.9 0.1 - 1.0 K/uL   Eosinophils Relative 2 %   Eosinophils Absolute 0.2 0.0 - 0.5 K/uL    Basophils Relative 1 %   Basophils Absolute 0.1 0.0 - 0.1 K/uL   Immature Granulocytes 0 %   Abs Immature Granulocytes 0.04 0.00 - 0.07 K/uL  Resp Panel by RT-PCR (Flu A&B, Covid) Anterior Nasal Swab     Status: None   Collection Time: 08/17/22  8:42 AM   Specimen: Anterior Nasal Swab  Result Value Ref Range   SARS Coronavirus 2 by RT PCR NEGATIVE NEGATIVE   Influenza A by PCR NEGATIVE NEGATIVE   Influenza B by PCR NEGATIVE NEGATIVE  Comprehensive metabolic panel     Status: Abnormal   Collection Time: 08/17/22  9:42 AM  Result Value Ref Range   Sodium 141 135 - 145 mmol/L   Potassium 3.5 3.5 - 5.1 mmol/L   Chloride 108 98 - 111 mmol/L   CO2 28 22 - 32 mmol/L   Glucose, Bld 109 (H) 70 - 99 mg/dL   BUN 12 8 - 23 mg/dL   Creatinine, Ser 0.47 0.44 - 1.00 mg/dL   Calcium 8.4 (L) 8.9 - 10.3 mg/dL   Total Protein 6.5 6.5 - 8.1 g/dL   Albumin 3.4 (L) 3.5 - 5.0 g/dL   AST 15 15 - 41 U/L   ALT 10 0 - 44 U/L   Alkaline Phosphatase 92 38 - 126 U/L   Total Bilirubin 0.6 0.3 - 1.2 mg/dL   GFR, Estimated >60 >60 mL/min   Anion gap 5 5 - 15  CBG monitoring, ED     Status: None   Collection Time: 08/17/22 11:25 AM  Result Value Ref Range   Glucose-Capillary 97 70 - 99 mg/dL   CXR: Emphysema (ICD10-J43.9). No acute cardiopulmonary abnormality.   Assessment and Plan: COPD exacerbation Acute on chronic respiratory failure     - place in obs, tele     - continue steroids, nebs     - add guaifenesin     - fluids     - continue her home azithro  Urinary retention     - during interview, she complained of urinary frequency and not feeling as if she completely emptied     - bladder scan shows 350+ retained     - place foley, check UA/UCx  GERD     - PPI  HLD     - continue home regimen when confirmed  Chronic back pain     - continue home regimen when confirmed  Advance Care Planning:   Code Status: FULL  Consults: None  Family Communication: w/ son at bedside  Severity of  Illness: The appropriate patient status for this patient is OBSERVATION. Observation status is judged to be reasonable and necessary in order to provide the required intensity of service to ensure the patient's safety. The patient's presenting symptoms, physical exam findings, and initial radiographic and laboratory data in the context of their medical condition is felt to place them at decreased risk for further clinical deterioration. Furthermore, it is anticipated that the patient will be medically stable for discharge from the hospital within 2 midnights of admission.   Author: Jonnie Finner, DO 08/17/2022 1:59 PM  For on call review [email or web address].

## 2022-08-18 ENCOUNTER — Observation Stay (HOSPITAL_COMMUNITY): Payer: Medicare HMO

## 2022-08-18 DIAGNOSIS — Z85828 Personal history of other malignant neoplasm of skin: Secondary | ICD-10-CM | POA: Diagnosis not present

## 2022-08-18 DIAGNOSIS — Z9981 Dependence on supplemental oxygen: Secondary | ICD-10-CM | POA: Diagnosis not present

## 2022-08-18 DIAGNOSIS — R350 Frequency of micturition: Secondary | ICD-10-CM | POA: Diagnosis present

## 2022-08-18 DIAGNOSIS — J9611 Chronic respiratory failure with hypoxia: Secondary | ICD-10-CM | POA: Diagnosis present

## 2022-08-18 DIAGNOSIS — Z792 Long term (current) use of antibiotics: Secondary | ICD-10-CM | POA: Diagnosis not present

## 2022-08-18 DIAGNOSIS — Z1152 Encounter for screening for COVID-19: Secondary | ICD-10-CM | POA: Diagnosis not present

## 2022-08-18 DIAGNOSIS — J9621 Acute and chronic respiratory failure with hypoxia: Secondary | ICD-10-CM | POA: Diagnosis not present

## 2022-08-18 DIAGNOSIS — Z825 Family history of asthma and other chronic lower respiratory diseases: Secondary | ICD-10-CM | POA: Diagnosis not present

## 2022-08-18 DIAGNOSIS — M545 Low back pain, unspecified: Secondary | ICD-10-CM | POA: Diagnosis not present

## 2022-08-18 DIAGNOSIS — Z801 Family history of malignant neoplasm of trachea, bronchus and lung: Secondary | ICD-10-CM | POA: Diagnosis not present

## 2022-08-18 DIAGNOSIS — S32010A Wedge compression fracture of first lumbar vertebra, initial encounter for closed fracture: Secondary | ICD-10-CM

## 2022-08-18 DIAGNOSIS — J439 Emphysema, unspecified: Secondary | ICD-10-CM | POA: Diagnosis present

## 2022-08-18 DIAGNOSIS — G8929 Other chronic pain: Secondary | ICD-10-CM | POA: Diagnosis present

## 2022-08-18 DIAGNOSIS — K219 Gastro-esophageal reflux disease without esophagitis: Secondary | ICD-10-CM | POA: Diagnosis present

## 2022-08-18 DIAGNOSIS — Z79899 Other long term (current) drug therapy: Secondary | ICD-10-CM | POA: Diagnosis not present

## 2022-08-18 DIAGNOSIS — M810 Age-related osteoporosis without current pathological fracture: Secondary | ICD-10-CM | POA: Diagnosis present

## 2022-08-18 DIAGNOSIS — Z87891 Personal history of nicotine dependence: Secondary | ICD-10-CM | POA: Diagnosis not present

## 2022-08-18 DIAGNOSIS — Z8249 Family history of ischemic heart disease and other diseases of the circulatory system: Secondary | ICD-10-CM | POA: Diagnosis not present

## 2022-08-18 DIAGNOSIS — Z811 Family history of alcohol abuse and dependence: Secondary | ICD-10-CM | POA: Diagnosis not present

## 2022-08-18 DIAGNOSIS — Z7952 Long term (current) use of systemic steroids: Secondary | ICD-10-CM | POA: Diagnosis not present

## 2022-08-18 DIAGNOSIS — J441 Chronic obstructive pulmonary disease with (acute) exacerbation: Secondary | ICD-10-CM | POA: Diagnosis present

## 2022-08-18 DIAGNOSIS — R42 Dizziness and giddiness: Secondary | ICD-10-CM | POA: Diagnosis present

## 2022-08-18 DIAGNOSIS — Z800 Family history of malignant neoplasm of digestive organs: Secondary | ICD-10-CM | POA: Diagnosis not present

## 2022-08-18 DIAGNOSIS — E559 Vitamin D deficiency, unspecified: Secondary | ICD-10-CM | POA: Diagnosis present

## 2022-08-18 DIAGNOSIS — R339 Retention of urine, unspecified: Secondary | ICD-10-CM | POA: Diagnosis present

## 2022-08-18 DIAGNOSIS — E7800 Pure hypercholesterolemia, unspecified: Secondary | ICD-10-CM | POA: Diagnosis present

## 2022-08-18 DIAGNOSIS — E785 Hyperlipidemia, unspecified: Secondary | ICD-10-CM | POA: Diagnosis present

## 2022-08-18 DIAGNOSIS — M4856XA Collapsed vertebra, not elsewhere classified, lumbar region, initial encounter for fracture: Secondary | ICD-10-CM | POA: Diagnosis present

## 2022-08-18 LAB — URINE CULTURE: Culture: NO GROWTH

## 2022-08-18 LAB — CBC
HCT: 32.5 % — ABNORMAL LOW (ref 36.0–46.0)
Hemoglobin: 10.2 g/dL — ABNORMAL LOW (ref 12.0–15.0)
MCH: 31.7 pg (ref 26.0–34.0)
MCHC: 31.4 g/dL (ref 30.0–36.0)
MCV: 100.9 fL — ABNORMAL HIGH (ref 80.0–100.0)
Platelets: 284 10*3/uL (ref 150–400)
RBC: 3.22 MIL/uL — ABNORMAL LOW (ref 3.87–5.11)
RDW: 13 % (ref 11.5–15.5)
WBC: 7.9 10*3/uL (ref 4.0–10.5)
nRBC: 0 % (ref 0.0–0.2)

## 2022-08-18 LAB — COMPREHENSIVE METABOLIC PANEL
ALT: 11 U/L (ref 0–44)
AST: 15 U/L (ref 15–41)
Albumin: 3.3 g/dL — ABNORMAL LOW (ref 3.5–5.0)
Alkaline Phosphatase: 89 U/L (ref 38–126)
Anion gap: 6 (ref 5–15)
BUN: 21 mg/dL (ref 8–23)
CO2: 25 mmol/L (ref 22–32)
Calcium: 9.1 mg/dL (ref 8.9–10.3)
Chloride: 111 mmol/L (ref 98–111)
Creatinine, Ser: 0.51 mg/dL (ref 0.44–1.00)
GFR, Estimated: >60 mL/min (ref >60)
Glucose, Bld: 129 mg/dL — ABNORMAL HIGH (ref 70–99)
Potassium: 4.4 mmol/L (ref 3.5–5.1)
Sodium: 142 mmol/L (ref 135–145)
Total Bilirubin: 0.7 mg/dL (ref 0.3–1.2)
Total Protein: 6.3 g/dL — ABNORMAL LOW (ref 6.5–8.1)

## 2022-08-18 MED ORDER — ENOXAPARIN SODIUM 40 MG/0.4ML IJ SOSY
40.0000 mg | PREFILLED_SYRINGE | INTRAMUSCULAR | Status: DC
  Administered 2022-08-18 – 2022-08-20 (×3): 40 mg via SUBCUTANEOUS
  Filled 2022-08-18 (×3): qty 0.4

## 2022-08-18 MED ORDER — METHYLPREDNISOLONE SODIUM SUCC 40 MG IJ SOLR
40.0000 mg | Freq: Two times a day (BID) | INTRAMUSCULAR | Status: AC
  Administered 2022-08-18 (×2): 40 mg via INTRAVENOUS
  Filled 2022-08-18 (×2): qty 1

## 2022-08-18 MED ORDER — ACETAMINOPHEN 325 MG PO TABS
650.0000 mg | ORAL_TABLET | Freq: Four times a day (QID) | ORAL | Status: DC | PRN
  Administered 2022-08-18: 650 mg via ORAL
  Filled 2022-08-18 (×2): qty 2

## 2022-08-18 MED ORDER — IPRATROPIUM-ALBUTEROL 0.5-2.5 (3) MG/3ML IN SOLN
3.0000 mL | Freq: Four times a day (QID) | RESPIRATORY_TRACT | Status: DC
  Administered 2022-08-18 – 2022-08-20 (×6): 3 mL via RESPIRATORY_TRACT
  Filled 2022-08-18 (×8): qty 3

## 2022-08-18 MED ORDER — IPRATROPIUM-ALBUTEROL 0.5-2.5 (3) MG/3ML IN SOLN
3.0000 mL | RESPIRATORY_TRACT | Status: DC | PRN
  Administered 2022-08-20: 3 mL via RESPIRATORY_TRACT
  Filled 2022-08-18: qty 3

## 2022-08-18 MED ORDER — GUAIFENESIN ER 600 MG PO TB12
600.0000 mg | ORAL_TABLET | Freq: Two times a day (BID) | ORAL | Status: DC
  Filled 2022-08-18 (×7): qty 1

## 2022-08-18 MED ORDER — GABAPENTIN 100 MG PO CAPS
100.0000 mg | ORAL_CAPSULE | Freq: Three times a day (TID) | ORAL | Status: DC
  Administered 2022-08-18 – 2022-08-20 (×8): 100 mg via ORAL
  Filled 2022-08-18 (×8): qty 1

## 2022-08-18 MED ORDER — ACETAMINOPHEN 650 MG RE SUPP: 650.0000 mg | Freq: Four times a day (QID) | RECTAL | Status: DC | PRN

## 2022-08-18 MED ORDER — ASPIRIN 81 MG PO TBEC
81.0000 mg | DELAYED_RELEASE_TABLET | ORAL | Status: DC
  Administered 2022-08-18 – 2022-08-20 (×2): 81 mg via ORAL
  Filled 2022-08-18 (×2): qty 1

## 2022-08-18 MED ORDER — BENZONATATE 100 MG PO CAPS: 200.0000 mg | ORAL_CAPSULE | Freq: Three times a day (TID) | ORAL | Status: DC | PRN

## 2022-08-18 MED ORDER — MELOXICAM 15 MG PO TABS
7.5000 mg | ORAL_TABLET | Freq: Every day | ORAL | Status: DC
  Administered 2022-08-18: 7.5 mg via ORAL
  Filled 2022-08-18 (×4): qty 1

## 2022-08-18 MED ORDER — PREDNISONE 20 MG PO TABS
40.0000 mg | ORAL_TABLET | Freq: Every day | ORAL | Status: DC
  Administered 2022-08-19 – 2022-08-20 (×2): 40 mg via ORAL
  Filled 2022-08-18 (×2): qty 2

## 2022-08-18 MED ORDER — SODIUM CHLORIDE 0.9 % IV SOLN: INTRAVENOUS | Status: DC

## 2022-08-18 MED ORDER — ATORVASTATIN CALCIUM 40 MG PO TABS
40.0000 mg | ORAL_TABLET | Freq: Every day | ORAL | Status: DC
  Administered 2022-08-18 – 2022-08-20 (×3): 40 mg via ORAL
  Filled 2022-08-18 (×4): qty 1

## 2022-08-18 MED ORDER — ORAL CARE MOUTH RINSE: 15.0000 mL | OROMUCOSAL | Status: DC | PRN

## 2022-08-18 MED ORDER — PANTOPRAZOLE SODIUM 40 MG PO TBEC
40.0000 mg | DELAYED_RELEASE_TABLET | Freq: Every day | ORAL | Status: DC
  Administered 2022-08-18 – 2022-08-20 (×3): 40 mg via ORAL
  Filled 2022-08-18 (×3): qty 1

## 2022-08-18 MED ORDER — AZITHROMYCIN 250 MG PO TABS
250.0000 mg | ORAL_TABLET | Freq: Every day | ORAL | Status: DC
  Administered 2022-08-18 – 2022-08-20 (×3): 250 mg via ORAL
  Filled 2022-08-18 (×4): qty 1

## 2022-08-18 NOTE — Progress Notes (Signed)
PROGRESS NOTE    Margaret Henry  QIO:[id number] DOB: 1950/11/01 DOA: 08/17/2022 PCP: [name], MD   Brief Narrative: No notes on file   Assessment and Plan:  COPD exacerbation Appears to be mild. Patient not at functional baseline as she is having dyspnea on exertion worse than her baseline. Associated productive cough with green sputum. Started empirically on steroids and azithromycin. -Start Duonebs -Continue prednisone and azithromycin -Sputum culture  Chronic respiratory failure Patient without acute failure. Patient is currently on 3 L/min and stable.  L1, L3 vertebral fracture Chronic back pain Patient follows with an orthopedic surgeon. Associated back pain. No neurologic deficits. Patient with concern for osteoporosis. On treatment for vitamin D deficiency. -Analgesic therapy; patient has allergies/intolerances to opiates -intact PTH  Urinary retention Unclear etiology. Foley catheter placed on admission. Urinalysis mostly unremarkable.  GERD -Continue Protonix  Hyperlipidemia -Continue Lipitor   DVT prophylaxis: Lovenox Code Status:   Code Status: Full Code Family Communication: None at bedside Disposition Plan: Discharge home likely in 1-3 days pending ability to function near baseline   Consultants:  None  Procedures:  Foley catheter insertion  Antimicrobials: Azithromycin    Subjective: Patient with continued cough and dyspnea on exertion. Sputum production.  Objective: BP (!) 156/66 (BP Location: Right Arm)   Pulse (!) 113   Temp 98.2 F (36.8 C)   Resp 18   Ht '5\' 4"'$  (1.626 m)   Wt 53.5 kg   SpO2 98%   BMI 20.25 kg/m   Examination:  General exam: Appears calm and comfortable Respiratory system: Clear to auscultation but diminished. Respiratory effort normal. Cardiovascular system: S1 & S2 heard, RRR. Gastrointestinal system: Abdomen is nondistended, soft and nontender. Normal bowel sounds heard. Central nervous system:  Alert and oriented. No focal neurological deficits. Musculoskeletal: No calf tenderness Skin: No cyanosis. No rashes Psychiatry: Judgement and insight appear normal. Mood & affect appropriate.    Data Reviewed: I have personally reviewed following labs and imaging studies  CBC Lab Results  Component Value Date   WBC 7.9 08/18/2022   RBC 3.22 (L) 08/18/2022   HGB 10.2 (L) 08/18/2022   HCT 32.5 (L) 08/18/2022   MCV 100.9 (H) 08/18/2022   MCH 31.7 08/18/2022   PLT 284 08/18/2022   MCHC 31.4 08/18/2022   RDW 13.0 08/18/2022   LYMPHSABS 0.7 08/17/2022   MONOABS 0.9 08/17/2022   EOSABS 0.2 08/17/2022   BASOSABS 0.1 32/44/0102     Last metabolic panel Lab Results  Component Value Date   NA 142 08/18/2022   K 4.4 08/18/2022   CL 111 08/18/2022   CO2 25 08/18/2022   BUN 21 08/18/2022   CREATININE 0.51 08/18/2022   GLUCOSE 129 (H) 08/18/2022   GFRNONAA >60 08/18/2022   GFRAA 109 11/18/2020   CALCIUM 9.1 08/18/2022   PHOS 3.5 06/18/2021   PROT 6.3 (L) 08/18/2022   ALBUMIN 3.3 (L) 08/18/2022   LABGLOB 2.3 07/04/2022   AGRATIO 1.9 07/04/2022   BILITOT 0.7 08/18/2022   ALKPHOS 89 08/18/2022   AST 15 08/18/2022   ALT 11 08/18/2022   ANIONGAP 6 08/18/2022    GFR: Estimated Creatinine Clearance: 53.7 mL/min (by C-G formula based on SCr of 0.51 mg/dL).  Recent Results (from the past 240 hour(s))  Resp Panel by RT-PCR (Flu A&B, Covid) Anterior Nasal Swab     Status: None   Collection Time: 08/17/22  8:42 AM   Specimen: Anterior Nasal Swab  Result Value Ref Range Status   SARS  Coronavirus 2 by RT PCR NEGATIVE NEGATIVE Final    Comment: (NOTE) SARS-CoV-2 target nucleic acids are NOT DETECTED.  The SARS-CoV-2 RNA is generally detectable in upper respiratory specimens during the acute phase of infection. The lowest concentration of SARS-CoV-2 viral copies this assay can detect is 138 copies/mL. A negative result does not preclude SARS-Cov-2 infection and should not be  used as the sole basis for treatment or other patient management decisions. A negative result may occur with  improper specimen collection/handling, submission of specimen other than nasopharyngeal swab, presence of viral mutation(s) within the areas targeted by this assay, and inadequate number of viral copies(<138 copies/mL). A negative result must be combined with clinical observations, patient history, and epidemiological information. The expected result is Negative.  Fact Sheet for Patients:  EntrepreneurPulse.com.au  Fact Sheet for Healthcare Providers:  IncredibleEmployment.be  This test is no t yet approved or cleared by the [place] FDA and  has been authorized for detection and/or diagnosis of SARS-CoV-2 by FDA under an Emergency Use Authorization (EUA). This EUA will remain  in effect (meaning this test can be used) for the duration of the COVID-19 declaration under Section 564(b)(1) of the Act, 21 U.S.C.section 360bbb-3(b)(1), unless the authorization is terminated  or revoked sooner.       Influenza A by PCR NEGATIVE NEGATIVE Final   Influenza B by PCR NEGATIVE NEGATIVE Final    Comment: (NOTE) The Xpert Xpress SARS-CoV-2/FLU/RSV plus assay is intended as an aid in the diagnosis of influenza from Nasopharyngeal swab specimens and should not be used as a sole basis for treatment. Nasal washings and aspirates are unacceptable for Xpert Xpress SARS-CoV-2/FLU/RSV testing.  Fact Sheet for Patients: EntrepreneurPulse.com.au  Fact Sheet for Healthcare Providers: IncredibleEmployment.be  This test is not yet approved or cleared by the [place] FDA and has been authorized for detection and/or diagnosis of SARS-CoV-2 by FDA under an Emergency Use Authorization (EUA). This EUA will remain in effect (meaning this test can be used) for the duration of the COVID-19 declaration under Section  564(b)(1) of the Act, 21 U.S.C. section 360bbb-3(b)(1), unless the authorization is terminated or revoked.  Performed at [facility], [place] [address] [place], [place] [address]   Urine Culture     Status: None   Collection Time: 08/17/22  2:44 PM   Specimen: Urine, Catheterized  Result Value Ref Range Status   Specimen Description   Final    URINE, CATHETERIZED Performed at [facility] [address], [place], [place] [address]    Special Requests   Final    NONE Performed at [facility], [place] [address], [place], [place] [address]    Culture   Final    NO GROWTH Performed at [facility], [place] [address], [place], [place] [address]    Report Status 08/18/2022 FINAL  Final      Radiology Studies: DG Lumbar Spine Complete  Result Date: 08/18/2022 CLINICAL DATA:  Chronic low back pain. History of compression fractures. EXAM: LUMBAR SPINE - COMPLETE 4+ VIEW COMPARISON:  07/05/2022 and more recently 08/15/2022. FINDINGS: The alignment of the lumbar spine is normal. There is a compression fracture involving the L1 vertebral body which has progressed when compared with 07/05/2022. There is now approximately 50% anterior vertebral body height loss. Compression fracture involving scratch set mild superior endplate compression fracture is noted at the L3 level with loss of approximately 10% of the vertebral body height. This is a  new finding compared with 07/05/2022 but appears unchanged from 08/15/2022. There is mild disc space narrowing at L5-S1. Aortic atherosclerotic calcifications.  Gallstones. IMPRESSION: 1. Since 07/05/2022 there is been progression of L1 compression fracture with approximately 50% anterior vertebral body height loss. This is stable however compared with more recent study from 08/15/2022 2. Mild superior endplate compression fracture at L3, unchanged from 08/15/2022 but new compared with 07/05/2022. 3.  Cholelithiasis. 4.  Aortic Atherosclerosis (ICD10-I70.0). Electronically Signed   By: Kerby Moors M.D.   On: 08/18/2022 14:15   DG Chest Portable 1 View  Result Date: 08/17/2022 CLINICAL DATA:  71 year old female with shortness of breath. EXAM: PORTABLE CHEST 1 VIEW COMPARISON:  Low-dose screening chest CT 07/27/2022 and earlier. FINDINGS: Portable AP semi upright view at 0907 hours. Pulmonary hyperinflation and emphysema as demonstrated on the CT last month. Calcified aortic atherosclerosis. Mediastinal contours remain normal. No pneumothorax, pulmonary edema, pleural effusion or acute pulmonary opacity. No acute osseous abnormality identified. IMPRESSION: Emphysema (ICD10-J43.9). No acute cardiopulmonary abnormality. Electronically Signed   By: Genevie Ann M.D.   On: 08/17/2022 09:16      LOS: 0 days    Cordelia Poche, MD [facility] 08/18/2022, 3:24 PM   If 7PM-7AM, please contact night-coverage [email or web address]

## 2022-08-18 NOTE — Progress Notes (Signed)
Pt arrived to the unit safely with all belongings. Moved tot he bed, placed on tele and vitals stable.

## 2022-08-19 DIAGNOSIS — J9611 Chronic respiratory failure with hypoxia: Secondary | ICD-10-CM | POA: Diagnosis not present

## 2022-08-19 DIAGNOSIS — S32010A Wedge compression fracture of first lumbar vertebra, initial encounter for closed fracture: Secondary | ICD-10-CM | POA: Diagnosis not present

## 2022-08-19 DIAGNOSIS — J441 Chronic obstructive pulmonary disease with (acute) exacerbation: Secondary | ICD-10-CM | POA: Diagnosis not present

## 2022-08-19 MED ORDER — CHLORHEXIDINE GLUCONATE CLOTH 2 % EX PADS
6.0000 | MEDICATED_PAD | Freq: Every day | CUTANEOUS | Status: DC
  Administered 2022-08-20: 6 via TOPICAL

## 2022-08-19 NOTE — Progress Notes (Addendum)
PROGRESS NOTE    Margaret Henry  FIE:[id number] DOB: [birth year] DOA: 08/17/2022 PCP: [name], MD   Brief Narrative: Margaret Henry is a 70 y.o. female with a history of COPD, chronic respiratory failure on 3 L/min of oxygen, hyperlipidemia, chronic back pain, GERD. Patient presented secondary to shortness of breath and found to have evidence of COPD exacerbation. Steroids, bronchodilators and antibiotics initiated. Discharge complicated by compromised functional capacity.   Assessment and Plan:  COPD exacerbation Appears to be mild. Patient not at functional baseline as she is having dyspnea on exertion worse than her baseline. Associated productive cough with green sputum. Started empirically on steroids and azithromycin. -Switch to Xopenex to hopefully avoid worsening tachycardia -Continue prednisone and azithromycin -Sputum culture -Continue PT/ambulation  Chronic respiratory failure Patient without acute failure. Patient is currently on 3 L/min and stable.  L1, L3 vertebral fracture Chronic back pain Patient follows with an orthopedic surgeon. Associated back pain. No neurologic deficits. Patient with concern for osteoporosis. On treatment for vitamin D deficiency. -Analgesic therapy; patient has allergies/intolerances to opiates -intact PTH pending  Urinary retention Unclear etiology. Foley catheter placed on admission. Urinalysis mostly unremarkable. -Voiding trial today; remove foley  GERD -Continue Protonix  Hyperlipidemia -Continue Lipitor   DVT prophylaxis: Lovenox Code Status:   Code Status: Full Code Family Communication: None at bedside Disposition Plan: Discharge home likely in 1-2 days pending ability to function near baseline   Consultants:  None  Procedures:  Foley catheter insertion  Antimicrobials: Azithromycin    Subjective: Patient reports continued productive cough. Symptoms stable at rest.  Objective: BP (!) 146/72 (BP  Location: Right Arm)   Pulse 90   Temp 98.2 F (36.8 C) (Oral)   Resp 19   Ht '5\' 4"'$  (1.626 m)   Wt 53.5 kg   SpO2 98%   BMI 20.25 kg/m   Examination:  General exam: Appears calm and comfortable Respiratory system: Clear to auscultation. Respiratory effort normal. Cardiovascular system: S1 & S2 heard, RRR. Gastrointestinal system: Abdomen is nondistended, soft and nontender. Normal bowel sounds heard. Central nervous system: Alert and oriented. No focal neurological deficits. Musculoskeletal: No calf tenderness Psychiatry: Judgement and insight appear normal. Mood & affect appropriate.    Data Reviewed: I have personally reviewed following labs and imaging studies  CBC Lab Results  Component Value Date   WBC 7.9 08/18/2022   RBC 3.22 (L) 08/18/2022   HGB 10.2 (L) 08/18/2022   HCT 32.5 (L) 08/18/2022   MCV 100.9 (H) 08/18/2022   MCH 31.7 08/18/2022   PLT 284 08/18/2022   MCHC 31.4 08/18/2022   RDW 13.0 08/18/2022   LYMPHSABS 0.7 08/17/2022   MONOABS 0.9 08/17/2022   EOSABS 0.2 08/17/2022   BASOSABS 0.1 16/60/6301     Last metabolic panel Lab Results  Component Value Date   NA 142 08/18/2022   K 4.4 08/18/2022   CL 111 08/18/2022   CO2 25 08/18/2022   BUN 21 08/18/2022   CREATININE 0.51 08/18/2022   GLUCOSE 129 (H) 08/18/2022   GFRNONAA >60 08/18/2022   GFRAA 109 11/18/2020   CALCIUM 9.1 08/18/2022   PHOS 3.5 06/18/2021   PROT 6.3 (L) 08/18/2022   ALBUMIN 3.3 (L) 08/18/2022   LABGLOB 2.3 07/04/2022   AGRATIO 1.9 07/04/2022   BILITOT 0.7 08/18/2022   ALKPHOS 89 08/18/2022   AST 15 08/18/2022   ALT 11 08/18/2022   ANIONGAP 6 08/18/2022    GFR: Estimated Creatinine Clearance: 53.7 mL/min (by C-G formula  based on SCr of 0.51 mg/dL).  Recent Results (from the past 240 hour(s))  Resp Panel by RT-PCR (Flu A&B, Covid) Anterior Nasal Swab     Status: None   Collection Time: 08/17/22  8:42 AM   Specimen: Anterior Nasal Swab  Result Value Ref Range Status    SARS Coronavirus 2 by RT PCR NEGATIVE NEGATIVE Final    Comment: (NOTE) SARS-CoV-2 target nucleic acids are NOT DETECTED.  The SARS-CoV-2 RNA is generally detectable in upper respiratory specimens during the acute phase of infection. The lowest concentration of SARS-CoV-2 viral copies this assay can detect is 138 copies/mL. A negative result does not preclude SARS-Cov-2 infection and should not be used as the sole basis for treatment or other patient management decisions. A negative result may occur with  improper specimen collection/handling, submission of specimen other than nasopharyngeal swab, presence of viral mutation(s) within the areas targeted by this assay, and inadequate number of viral copies(<138 copies/mL). A negative result must be combined with clinical observations, patient history, and epidemiological information. The expected result is Negative.  Fact Sheet for Patients:  EntrepreneurPulse.com.au  Fact Sheet for Healthcare Providers:  IncredibleEmployment.be  This test is no t yet approved or cleared by the [place] FDA and  has been authorized for detection and/or diagnosis of SARS-CoV-2 by FDA under an Emergency Use Authorization (EUA). This EUA will remain  in effect (meaning this test can be used) for the duration of the COVID-19 declaration under Section 564(b)(1) of the Act, 21 U.S.C.section 360bbb-3(b)(1), unless the authorization is terminated  or revoked sooner.       Influenza A by PCR NEGATIVE NEGATIVE Final   Influenza B by PCR NEGATIVE NEGATIVE Final    Comment: (NOTE) The Xpert Xpress SARS-CoV-2/FLU/RSV plus assay is intended as an aid in the diagnosis of influenza from Nasopharyngeal swab specimens and should not be used as a sole basis for treatment. Nasal washings and aspirates are unacceptable for Xpert Xpress SARS-CoV-2/FLU/RSV testing.  Fact Sheet for  Patients: EntrepreneurPulse.com.au  Fact Sheet for Healthcare Providers: IncredibleEmployment.be  This test is not yet approved or cleared by the [place] FDA and has been authorized for detection and/or diagnosis of SARS-CoV-2 by FDA under an Emergency Use Authorization (EUA). This EUA will remain in effect (meaning this test can be used) for the duration of the COVID-19 declaration under Section 564(b)(1) of the Act, 21 U.S.C. section 360bbb-3(b)(1), unless the authorization is terminated or revoked.  Performed at [facility], [place] [address], [place], [place] [address]   Urine Culture     Status: None   Collection Time: 08/17/22  2:44 PM   Specimen: Urine, Catheterized  Result Value Ref Range Status   Specimen Description   Final    URINE, CATHETERIZED Performed at [facility] [address], [place], [place] [address]    Special Requests   Final    NONE Performed at [facility], [place] [address], [place], [place] [address]    Culture   Final    NO GROWTH Performed at [facility], [place] [address], [place], [place] [address]    Report Status 08/18/2022 FINAL  Final      Radiology Studies: DG Lumbar Spine Complete  Result Date: 08/18/2022 CLINICAL DATA:  Chronic low back pain. History of compression fractures. EXAM: LUMBAR SPINE - COMPLETE 4+ VIEW COMPARISON:  07/05/2022 and more recently 08/15/2022. FINDINGS: The alignment of the lumbar spine is normal. There is  a compression fracture involving the L1 vertebral body which has progressed when compared with 07/05/2022. There is now approximately 50% anterior vertebral body height loss. Compression fracture involving scratch set mild superior endplate compression fracture is noted at the L3 level with loss of approximately 10% of the vertebral body height. This is a new finding compared with 07/05/2022 but appears unchanged  from 08/15/2022. There is mild disc space narrowing at L5-S1. Aortic atherosclerotic calcifications.  Gallstones. IMPRESSION: 1. Since 07/05/2022 there is been progression of L1 compression fracture with approximately 50% anterior vertebral body height loss. This is stable however compared with more recent study from 08/15/2022 2. Mild superior endplate compression fracture at L3, unchanged from 08/15/2022 but new compared with 07/05/2022. 3. Cholelithiasis. 4.  Aortic Atherosclerosis (ICD10-I70.0). Electronically Signed   By: Kerby Moors M.D.   On: 08/18/2022 14:15      LOS: 1 day    Cordelia Poche, MD [facility] 08/19/2022, 1:52 PM   If 7PM-7AM, please contact night-coverage [email or web address]

## 2022-08-19 NOTE — Evaluation (Signed)
Physical Therapy Evaluation Patient Details Name: Margaret Henry MRN: [id number] DOB: [birth year] Today's Date: 08/19/2022  History of Present Illness  71 y.o. female Presenting with shortness of breath, admitted with COPD exacerbation and urinary retention. PMH: COPD, chronic hypoxic respiratory failure on 3L  at baseline, HLD, chronic back pain, GERD.  Clinical Impression  Pt admitted with above diagnosis.  Pt is weaker than her current baseline, requiring min assist for transfers and assist to amb ~ 10' with HR elevated to 139 bpm after 10', resting in low 100s. SpO2=93% on 3L.  Pt would benefit from HHPT and rollator for home .  Pt currently with functional limitations due to the deficits listed below (see PT Problem List). Pt will benefit from skilled PT to increase their independence and safety with mobility to allow discharge to the venue listed below.          Recommendations for follow up therapy are one component of a multi-disciplinary discharge planning process, led by the attending physician.  Recommendations may be updated based on patient status, additional functional criteria and insurance authorization.  Follow Up Recommendations Home health PT      Assistance Recommended at Discharge Intermittent Supervision/Assistance  Patient can return home with the following  Help with stairs or ramp for entrance;Assist for transportation;Assistance with cooking/housework    Equipment Recommendations Rollator (4 wheels)  Recommendations for Other Services       Functional Status Assessment Patient has had a recent decline in their functional status and demonstrates the ability to make significant improvements in function in a reasonable and predictable amount of time.     Precautions / Restrictions Precautions Precautions: Fall Restrictions Weight Bearing Restrictions: No      Mobility  Bed Mobility               General bed mobility comments: in recliner     Transfers Overall transfer level: Needs assistance Equipment used: Rolling walker (2 wheels) Transfers: Sit to/from Stand Sit to Stand: Min assist, Min guard           General transfer comment: light assist to rise, pt significantly reliant on UEs, incr time    Ambulation/Gait Ambulation/Gait assistance: Min assist, Min guard Gait Distance (Feet): 10 Feet (x2) Assistive device: None, 1 person hand held assist Gait Pattern/deviations: Step-through pattern, Decreased stride length, Trunk flexed       General Gait Details: min assist to steady initially, unsteady gait without UE support; on 3L with SpO2=93%, HR max 139 after amb 10', resting HR in low 100s  Stairs            Wheelchair Mobility    Modified Rankin (Stroke Patients Only)       Balance Overall balance assessment: Needs assistance   Sitting balance-Leahy Scale: Fair     Standing balance support: Reliant on assistive device for balance, During functional activity Standing balance-Leahy Scale: Fair                               Pertinent Vitals/Pain Pain Assessment Pain Assessment: Faces Faces Pain Scale: Hurts little more Pain Location: back Pain Descriptors / Indicators: Grimacing Pain Intervention(s): Limited activity within patient's tolerance, Monitored during session, Repositioned    Home Living Family/patient expects to be discharged to:: Private residence Living Arrangements: Alone Available Help at Discharge: Family (nephew checks on her) Type of Home: Mobile home Home Access: Stairs to enter   Entrance Stairs-Number  of Steps: 3   Home Layout: One level Home Equipment: None Additional Comments: O2    Prior Function Prior Level of Function : Needs assist             Mobility Comments: IND household amb, wears 3L O2 all the time       Hand Dominance        Extremity/Trunk Assessment   Upper Extremity Assessment Upper Extremity Assessment: Generalized  weakness    Lower Extremity Assessment Lower Extremity Assessment: Generalized weakness    Cervical / Trunk Assessment Cervical / Trunk Assessment: Kyphotic  Communication   Communication: No difficulties  Cognition Arousal/Alertness: Awake/alert Behavior During Therapy: WFL for tasks assessed/performed Overall Cognitive Status: Within Functional Limits for tasks assessed                                          General Comments      Exercises     Assessment/Plan    PT Assessment Patient needs continued PT services  PT Problem List Decreased strength;Decreased range of motion;Decreased activity tolerance;Decreased balance;Decreased mobility;Decreased knowledge of precautions;Decreased knowledge of use of DME       PT Treatment Interventions DME instruction;Therapeutic exercise;Gait training;Functional mobility training;Therapeutic activities;Patient/family education    PT Goals (Current goals can be found in the Care Plan section)  Acute Rehab PT Goals Patient Stated Goal: home when feeling better PT Goal Formulation: With patient Time For Goal Achievement: 09/02/22 Potential to Achieve Goals: Good    Frequency Min 3X/week     Co-evaluation               AM-PAC PT "6 Clicks" Mobility  Outcome Measure Help needed turning from your back to your side while in a flat bed without using bedrails?: A Little Help needed moving from lying on your back to sitting on the side of a flat bed without using bedrails?: A Little Help needed moving to and from a bed to a chair (including a wheelchair)?: A Little Help needed standing up from a chair using your arms (e.g., wheelchair or bedside chair)?: A Little Help needed to walk in hospital room?: A Little Help needed climbing 3-5 steps with a railing? : A Little 6 Click Score: 18    End of Session   Activity Tolerance: Patient limited by fatigue Patient left: in chair;with call bell/phone within  reach Nurse Communication: Mobility status PT Visit Diagnosis: Other abnormalities of gait and mobility (R26.89);Difficulty in walking, not elsewhere classified (R26.2)    Time: 3893-7342 PT Time Calculation (min) (ACUTE ONLY): 20 min   Charges:   PT Evaluation $PT Eval Low Complexity: Petronila, PT  Acute Rehab Dept [facility]) [phone]  [facility] Weekend Pager [facility] only)  [phone]  08/19/2022   [facility] 08/19/2022, 2:38 PM

## 2022-08-19 NOTE — Progress Notes (Signed)
Pt has arrived to Room 1510 from [address]. Alert and oriented.  Telemetry started.

## 2022-08-19 NOTE — Hospital Course (Signed)
Margaret Henry is a 71 y.o. female with a history of COPD, chronic respiratory failure on 3 L/min of oxygen, hyperlipidemia, chronic back pain, GERD. Patient presented secondary to shortness of breath and found to have evidence of COPD exacerbation. Steroids, bronchodilators and antibiotics initiated. Discharge complicated by compromised functional capacity.

## 2022-08-20 ENCOUNTER — Other Ambulatory Visit (HOSPITAL_BASED_OUTPATIENT_CLINIC_OR_DEPARTMENT_OTHER): Payer: Self-pay

## 2022-08-20 DIAGNOSIS — J9621 Acute and chronic respiratory failure with hypoxia: Secondary | ICD-10-CM | POA: Diagnosis not present

## 2022-08-20 DIAGNOSIS — R339 Retention of urine, unspecified: Secondary | ICD-10-CM | POA: Diagnosis not present

## 2022-08-20 DIAGNOSIS — K219 Gastro-esophageal reflux disease without esophagitis: Secondary | ICD-10-CM | POA: Diagnosis not present

## 2022-08-20 DIAGNOSIS — J441 Chronic obstructive pulmonary disease with (acute) exacerbation: Secondary | ICD-10-CM | POA: Diagnosis not present

## 2022-08-20 DIAGNOSIS — E785 Hyperlipidemia, unspecified: Secondary | ICD-10-CM

## 2022-08-20 MED ORDER — LEVALBUTEROL HCL 1.25 MG/0.5ML IN NEBU: 1.2500 mg | INHALATION_SOLUTION | Freq: Three times a day (TID) | RESPIRATORY_TRACT | Status: DC

## 2022-08-20 MED ORDER — POLYETHYLENE GLYCOL 3350 17 G PO PACK
17.0000 g | PACK | Freq: Two times a day (BID) | ORAL | Status: DC
  Administered 2022-08-20: 17 g via ORAL
  Filled 2022-08-20: qty 1

## 2022-08-20 MED ORDER — ACETAMINOPHEN 500 MG PO TABS: 500.0000 mg | ORAL_TABLET | ORAL | 0 refills | Status: DC | PRN

## 2022-08-20 MED ORDER — LEVALBUTEROL HCL 1.25 MG/0.5ML IN NEBU
1.2500 mg | INHALATION_SOLUTION | Freq: Three times a day (TID) | RESPIRATORY_TRACT | Status: DC
  Administered 2022-08-20: 1.25 mg via RESPIRATORY_TRACT
  Filled 2022-08-20: qty 0.5

## 2022-08-20 MED ORDER — GUAIFENESIN ER 600 MG PO TB12
600.0000 mg | ORAL_TABLET | Freq: Two times a day (BID) | ORAL | 0 refills | Status: AC
  Filled 2022-08-20: qty 10, 5d supply, fill #0

## 2022-08-20 MED ORDER — PREDNISONE 10 MG PO TABS
ORAL_TABLET | ORAL | 0 refills | Status: DC
  Filled 2022-08-20: qty 45, 36d supply, fill #0

## 2022-08-20 MED ORDER — BISACODYL 10 MG RE SUPP
10.0000 mg | Freq: Once | RECTAL | Status: AC
  Administered 2022-08-20: 10 mg via RECTAL
  Filled 2022-08-20: qty 1

## 2022-08-20 NOTE — Discharge Instructions (Signed)
Margaret Henry,  You were in the hospital with a COPD exacerbation and have improved with steroids, antibiotics and breathing treatments. Please follow-up with your PCP. You also were found to have incomplete bladder emptying. I recommended a catheter but you have declined. Please follow-up with the urologist. Please return if you cannot pee/urinate.

## 2022-08-20 NOTE — Plan of Care (Signed)
  Problem: Education: Goal: Knowledge of disease or condition will improve Outcome: Adequate for Discharge Goal: Knowledge of the prescribed therapeutic regimen will improve Outcome: Adequate for Discharge Goal: Individualized Educational Video(s) Outcome: Adequate for Discharge   Problem: Activity: Goal: Ability to tolerate increased activity will improve Outcome: Adequate for Discharge Goal: Will verbalize the importance of balancing activity with adequate rest periods Outcome: Adequate for Discharge   Problem: Respiratory: Goal: Ability to maintain a clear airway will improve Outcome: Adequate for Discharge Goal: Levels of oxygenation will improve Outcome: Adequate for Discharge Goal: Ability to maintain adequate ventilation will improve Outcome: Adequate for Discharge   Problem: Education: Goal: Knowledge of General Education information will improve Description: Including pain rating scale, medication(s)/side effects and non-pharmacologic comfort measures Outcome: Adequate for Discharge   Problem: Health Behavior/Discharge Planning: Goal: Ability to manage health-related needs will improve Outcome: Adequate for Discharge   Problem: Clinical Measurements: Goal: Ability to maintain clinical measurements within normal limits will improve Outcome: Adequate for Discharge Goal: Will remain free from infection Outcome: Adequate for Discharge Goal: Diagnostic test results will improve Outcome: Adequate for Discharge Goal: Respiratory complications will improve Outcome: Adequate for Discharge Goal: Cardiovascular complication will be avoided Outcome: Adequate for Discharge   Problem: Activity: Goal: Risk for activity intolerance will decrease Outcome: Adequate for Discharge   Problem: Nutrition: Goal: Adequate nutrition will be maintained Outcome: Adequate for Discharge

## 2022-08-20 NOTE — Progress Notes (Signed)
MD ordered post void I&O cath d/t retention and frequency, I&O cath done post void had 400 ml output, patient still complaining her bladder was full. Bladder scan done showed 20m. MD orders to place indwelling urethral catheter for patient to d/c home with. Went to place cath patient refused. Patient was educated on urinary retention and the high risk for infection. Patient also was informed that a HHRN could come to her home and help with the cath. Patient verbalizes understanding of the risk and states she will just deal with having to go pee all the time. MD made aware and is coming back to floor to see patient. The plan is for patient to discharge this afternoon.  Jennel Mara, MTivis Ringer RN

## 2022-08-20 NOTE — Progress Notes (Signed)
Physical Therapy Treatment Patient Details Name: Margaret Henry MRN: [id number] DOB: 08/31/1951 Today's Date: 08/20/2022   History of Present Illness 71 y.o. female Presenting with shortness of breath, admitted with COPD exacerbation and urinary retention. PMH: COPD, chronic hypoxic respiratory failure on 3L [place] at baseline, HLD, chronic back pain, GERD.    PT Comments    Pt is slowly progressing towards goals. Pt reported she does not know when she will be d/c home. Her primary concern is inability to mobilize due to elevated HR with minimal activity. Pt was able to ambulate this morning 37f with RW min guard assist HR 99-110 and O2 sats 88-96% on 3L. Pt declined a second walk. Reviewed HEP with pt and she reported she knows the exercises and can do them on her own. Pt will continue to benefit from continued acute skilled PT to maximize mobility and independence for return home.Encourage frequent ambulation with staff to increase acitivity tolerance.  Recommendations for follow up therapy are one component of a multi-disciplinary discharge planning process, led by the attending physician.  Recommendations may be updated based on patient status, additional functional criteria and insurance authorization.  Follow Up Recommendations  Home health PT     Assistance Recommended at Discharge Intermittent Supervision/Assistance  Patient can return home with the following Help with stairs or ramp for entrance;Assist for transportation;Assistance with cooking/housework   Equipment Recommendations  Rollator (4 wheels)    Recommendations for Other Services       Precautions / Restrictions Precautions Precautions: Fall Precaution Comments: dyspnea, back pain due to recent compression FX-no brace per pt     Mobility  Bed Mobility               General bed mobility comments: in recliner    Transfers Overall transfer level: Modified independent Equipment used: Rolling walker (2  wheels) Transfers: Sit to/from Stand Sit to Stand: Modified independent (Device/Increase time)                Ambulation/Gait Ambulation/Gait assistance: Min guard Gait Distance (Feet): 65 Feet Assistive device: Rolling walker (2 wheels) Gait Pattern/deviations: Step-through pattern, Decreased stride length, Trunk flexed Gait velocity: decreased     General Gait Details: O2 sats HR 99-110 with Gait O2 96-88% with gait, declined second walk   Stairs             Wheelchair Mobility    Modified Rankin (Stroke Patients Only)       Balance Overall balance assessment: Mild deficits observed, not formally tested         Standing balance support: Reliant on assistive device for balance, During functional activity Standing balance-Leahy Scale: Fair                              Cognition Arousal/Alertness: Awake/alert Behavior During Therapy: WFL for tasks assessed/performed Overall Cognitive Status: Within Functional Limits for tasks assessed                                          Exercises      General Comments        Pertinent Vitals/Pain Pain Assessment Pain Assessment: 0-10 Pain Score: 4  Pain Location: back Pain Descriptors / Indicators: Discomfort Pain Intervention(s): Limited activity within patient's tolerance    Home Living  Prior Function            PT Goals (current goals can now be found in the care plan section) Acute Rehab PT Goals Patient Stated Goal: home when feeling better Potential to Achieve Goals: Good Progress towards PT goals: Progressing toward goals    Frequency    Min 3X/week      PT Plan Current plan remains appropriate    Co-evaluation              AM-PAC PT "6 Clicks" Mobility   Outcome Measure  Help needed turning from your back to your side while in a flat bed without using bedrails?: A Little Help needed moving from lying on  your back to sitting on the side of a flat bed without using bedrails?: A Little Help needed moving to and from a bed to a chair (including a wheelchair)?: A Little Help needed standing up from a chair using your arms (e.g., wheelchair or bedside chair)?: None Help needed to walk in hospital room?: A Little Help needed climbing 3-5 steps with a railing? : A Little 6 Click Score: 19    End of Session Equipment Utilized During Treatment: Gait belt Activity Tolerance: Patient limited by fatigue Patient left: in chair;with call bell/phone within reach Nurse Communication: Mobility status PT Visit Diagnosis: Other abnormalities of gait and mobility (R26.89);Difficulty in walking, not elsewhere classified (R26.2)     Time: 3709-6438 PT Time Calculation (min) (ACUTE ONLY): 18 min  Charges:  $Gait Training: 8-22 mins                        Lelon Mast 08/20/2022, 8:59 AM

## 2022-08-20 NOTE — TOC Initial Note (Signed)
Transition of Care [facility]) - Initial/Assessment Note    Patient Details  Name: Margaret Henry MRN: [id number] Date of Birth: Feb 18, 1951  Transition of Care [facility]) CM/SW Contact:    Vassie Moselle, LCSW Phone Number: 08/20/2022, 1:32 PM  Clinical Narrative:                 Met with pt and confirmed plans for home health. Pt shares she has a Education officer, museum and physical therapist with [facility]. CSW contacted [facility] to confirm these services and added OT/aide to her services. PT will be receiving HHPT/OT/Aide/SW from [facility].  Pt agreeable to having rollator ordered.  CSW contacted [employer] who will deliver Rollator to pt's room prior to discharge.  Expected Discharge Plan: [place] Barriers to Discharge: No Barriers Identified   Patient Goals and CMS Choice Patient states their goals for this hospitalization and ongoing recovery are:: To go home CMS Medicare.gov Compare Post Acute Care list provided to:: Patient Choice offered to / list presented to : Patient  Expected Discharge Plan and Services Expected Discharge Plan: [place] In-house Referral: NA Discharge Planning Services: CM Consult Post Acute Care Choice: Home Health, Durable Medical Equipment Living arrangements for the past 2 months: Apartment                 DME Arranged: Walker rolling with seat DME Agency: [employer] Date DME Agency Contacted: 08/20/22 Time DME Agency Contacted: 58 Representative spoke with at DME Agency: [place]: PT, OT, Nurse's Aide, Social Work, Therapist, sports HH Agency: [facility] Date [place]: 08/20/22 Time [place]: [place] Representative spoke with at [place]: [place] Arrangements/Services Living arrangements for the past 2 months: [place] with:: Self Patient language and need for interpreter reviewed:: Yes Do you feel safe going back to the place where you live?: Yes      Need for Family  Participation in Patient Care: No (Comment) Care giver support system in place?: Yes (comment) Current home services: DME, Home PT, Other (comment) (Social work) Architect Involvement Pertinent to Current Situation/Hospitalization: No - Comment as needed  Activities of Daily [place] Devices/Equipment: Shower chair with back, Nebulizer, Oxygen, Dentures (specify type) (full set dentures) ADL Screening (condition at time of admission) Patient's cognitive ability adequate to safely complete daily activities?: Yes Is the patient deaf or have difficulty hearing?: No Does the patient have difficulty seeing, even when wearing glasses/contacts?: No Does the patient have difficulty concentrating, remembering, or making decisions?: No Patient able to express need for assistance with ADLs?: Yes Does the patient have difficulty dressing or bathing?: Yes Independently performs ADLs?: No Communication: Independent Dressing (OT): Needs assistance Is this a change from baseline?: Change from baseline, expected to last >3 days Grooming: Independent Feeding: Independent Bathing: Needs assistance Is this a change from baseline?: Change from baseline, expected to last >3 days Toileting: Needs assistance Is this a change from baseline?: Change from baseline, expected to last >3days In/Out Bed: Needs assistance Is this a change from baseline?: Change from baseline, expected to last >3 days Walks in Home: Needs assistance Is this a change from baseline?: Change from baseline, expected to last >3 days Does the patient have difficulty walking or climbing stairs?: Yes Weakness of Legs: Both Weakness of Arms/Hands: Both  Permission Sought/Granted Permission sought to share information with : Case Manager Permission granted to share information with : No  Emotional Assessment Appearance:: Appears stated age Attitude/Demeanor/Rapport: Engaged Affect (typically  observed): Accepting Orientation: : Oriented to Self, Oriented to Place, Oriented to  Time, Oriented to Situation Alcohol / Substance Use: Not Applicable Psych Involvement: No (comment)  Admission diagnosis:  COPD exacerbation ([place]) [J44.1] Patient Active Problem List   Diagnosis Date Noted   Acute urinary retention 08/17/2022   COPD exacerbation ([place]) 08/17/2022   CAP (community acquired pneumonia) 06/12/2022   Shortness of breath 05/31/2022   Lung nodule 05/23/2022   Iron deficiency anemia 04/07/2022   Neuropathy 04/07/2022   B12 deficiency anemia 04/06/2022   Protein calorie malnutrition ([place]) 01/10/2022   Syncope and collapse 06/13/2021   COVID-19 virus infection 06/13/2021   Acute on chronic respiratory failure with hypoxia ([place]) 06/13/2021   Mild aortic insufficiency 05/16/2021   Hemorrhoids 05/16/2021   Colon polyps 05/16/2021   Cancer ([place]) 05/16/2021   Bronchitis 05/16/2021   Allergy 05/16/2021   COPD with acute exacerbation ([place]) 10/03/2018   Overactive bladder 03/17/2018   Chronic respiratory failure ([place]) 10/04/2017   Seborrheic keratoses 06/12/2016   Coronary arteriosclerosis 09/26/2015   Unspecified vitamin D deficiency 03/02/2010   Allergic rhinitis 03/02/2010   BACK PAIN, LUMBAR 03/02/2010   TROCHANTERIC BURSITIS, BILATERAL 03/02/2010   THYROID NODULE, RIGHT 11/09/2009   Hyperlipidemia 02/25/2009   GERD (gastroesophageal reflux disease) 02/25/2009   Osteoporosis 01/30/2009   COMPRESSION FRACTURE, SPINE 01/21/2009   Goiter, unspecified 10/08/1997   PCP:  [name], MD Pharmacy:   [facility] [place], [place] [address] Phone: [phone] Fax: [place] [address], [place] [address] Phone: [phone] Fax: [phone]     Social Determinants of Health (SDOH) Interventions     Readmission Risk Interventions    08/20/2022    1:29 PM  Readmission Risk Prevention Plan  Post Dischage Appt Complete  Medication Screening Complete  Transportation Screening Complete

## 2022-08-20 NOTE — Discharge Summary (Signed)
Physician Discharge Summary   Patient: Margaret Henry MRN: [id number] DOB: 05-04-1951  Admit date:     08/17/2022  Discharge date: 08/20/22  Discharge Physician: Cordelia Poche, MD   PCP: [name], MD   Recommendations at discharge:  PCP follow-up Urology follow-up for incomplete bladder emptying Follow-up intact PTH  Discharge Diagnoses: Principal Problem:   COPD with acute exacerbation ([place]) Active Problems:   Hyperlipidemia   GERD (gastroesophageal reflux disease)   BACK PAIN, LUMBAR   Acute on chronic respiratory failure with hypoxia ([place])   Incomplete bladder emptying   COPD exacerbation ([place])  Resolved Problems:   * No resolved hospital problems. *  Hospital Course: Margaret Henry is a 71 y.o. female with a history of COPD, chronic respiratory failure on 3 L/min of oxygen, hyperlipidemia, chronic back pain, GERD. Patient presented secondary to shortness of breath and found to have evidence of COPD exacerbation. Steroids, bronchodilators and antibiotics initiated. Discharge complicated by compromised functional capacity, however patient improved enough with therapy to be safe for discharge home with home health services. Patient with evidence of incomplete bladder emptying and declined catheter management; risks discussed.  Assessment and Plan:  COPD exacerbation Appears to be mild. Patient not at functional baseline as she is having dyspnea on exertion worse than her baseline. Associated productive cough with green sputum. Started empirically on steroids and azithromycin with improvement of symptoms. Patient to continue azithromycin already prescribed. Prednisone taper back to chronic dose on discharge. PT/OT recommending home health therapy.   Chronic respiratory failure Patient without acute failure. Patient is currently on 3 L/min and stable.   L1, L3 vertebral fracture Chronic back pain Patient follows with an orthopedic surgeon. Associated back pain. No  neurologic deficits. Patient with concern for osteoporosis. On treatment for vitamin D deficiency. Intact PTH pending per patient's request from PCP order in setting of acute fracture.   Incomplete bladder emptying Unclear etiology. Foley catheter placed on admission. Urinalysis mostly unremarkable.patient failed voiding trial but declined to have foley catheter replaced and declined to attempt self intermittent catheterizations. Patient set up for an appointment with urology.   GERD Continue Protonix   Hyperlipidemia Continue Lipitor   Consultants: None Procedures performed: None  Disposition: Home Diet recommendation: Regular diet  DISCHARGE MEDICATION: Allergies as of 08/20/2022       Reactions   Propoxyphene Nausea And Vomiting   Hydrocodone Nausea And Vomiting   Propoxyphene N-acetaminophen Nausea And Vomiting        Medication List     TAKE these medications    acetaminophen 500 MG tablet Commonly known as: TYLENOL Take 1 tablet (500 mg total) by mouth every 4 (four) hours as needed for headache. What changed: how much to take   albuterol (2.5 MG/3ML) 0.083% nebulizer solution Commonly known as: PROVENTIL Take 2.5 mg by nebulization every 6 (six) hours as needed for wheezing or shortness of breath.   alendronate 70 MG tablet Commonly known as: FOSAMAX Take 1 tablet (70 mg total) by mouth every 7 (seven) days. Take with a full glass of water on an empty stomach. What changed: additional instructions   Arexvy 120 MCG/0.5ML injection Generic drug: RSV vaccine recomb adjuvanted Inject into the muscle.   aspirin EC 81 MG tablet Commonly known as: Aspirin Adult Low Strength Take 1 tablet (81 mg total) by mouth at bedtime. What changed: when to take this   atorvastatin 40 MG tablet Commonly known as: LIPITOR TAKE 1 TABLET EVERY DAY   azithromycin 250  MG tablet Commonly known as: ZITHROMAX Take 250 mg by mouth daily.   benzonatate 200 MG capsule Commonly  known as: TESSALON Take 1 capsule (200 mg total) by mouth 3 (three) times daily as needed for cough.   bismuth subsalicylate 026 VZ/85YI suspension Commonly known as: PEPTO BISMOL Take 30 mLs by mouth every 6 (six) hours as needed for indigestion.   Breo Ellipta 200-25 MCG/ACT Aepb Generic drug: fluticasone furoate-vilanterol Inhale 1 puff into the lungs daily.   cyanocobalamin 1000 MCG tablet Commonly known as: VITAMIN B12 Take 1 tablet (1,000 mcg total) by mouth daily.   FeroSul 325 (65 FE) MG tablet Generic drug: ferrous sulfate Take 1 tablet (325 mg total) by mouth 2 (two) times daily with a meal. What changed: when to take this   fluticasone 50 MCG/ACT nasal spray Commonly known as: FLONASE USE 2 SPRAYS IN EACH NOSTRIL EVERY DAY What changed:  how much to take how to take this when to take this reasons to take this additional instructions   gabapentin 100 MG capsule Commonly known as: NEURONTIN Take 1 capsule (100 mg total) by mouth 3 (three) times daily.   guaiFENesin 600 MG 12 hr tablet Commonly known as: MUCINEX Take 1 tablet (600 mg total) by mouth 2 (two) times daily for 5 days.   ipratropium-albuterol 0.5-2.5 (3) MG/3ML Soln Commonly known as: DUONEB INHALE THE CONTENTS OF 1 VIAL VIA NEBULIZER EVERY 6 HOURS AS NEEDED What changed: See the new instructions.   loratadine 10 MG tablet Commonly known as: CLARITIN Take 1 tablet (10 mg total) by mouth daily.   meloxicam 7.5 MG tablet Commonly known as: MOBIC Take 1 tablet (7.5 mg total) by mouth daily.   mupirocin ointment 2 % Commonly known as: BACTROBAN Apply 1 Application topically 2 (two) times daily. To affected nostril   OXYGEN Inhale 3 L into the lungs daily.   predniSONE 10 MG tablet Commonly known as: DELTASONE Take 3 tablets (30 mg total) by mouth daily with breakfast for 3 days, THEN 2 tablets (20 mg total) daily with breakfast for 3 days, THEN 1 tablet (10 mg total) daily with  breakfast. Start taking on: August 20, 2022 What changed: See the new instructions.   Spiriva Respimat 2.5 MCG/ACT Aers Generic drug: Tiotropium Bromide Monohydrate INHALE 2 PUFFS EVERY DAY What changed: See the new instructions.   terbinafine 250 MG tablet Commonly known as: LAMISIL Take 1 tablet (250 mg total) by mouth daily.   traMADol 50 MG tablet Commonly known as: ULTRAM Take 1 tablet (50 mg total) by mouth every 6 (six) hours as needed for up to 5 days.   Vitamin D (Ergocalciferol) 1.25 MG (50000 UNIT) Caps capsule Commonly known as: DRISDOL Take 1 capsule (50,000 Units total) by mouth every 7 (seven) days. What changed: additional instructions               Durable Medical Equipment  (From admission, onward)           Start     Ordered   08/20/22 0942  For home use only DME 4 wheeled rolling walker with seat  Once       Question Answer Comment  Patient needs a walker to treat with the following condition COPD exacerbation ([place])   Patient needs a walker to treat with the following condition Chronic respiratory failure ([place])      08/20/22 0941   08/19/22 1441  For home use only DME Walker rolling  Once  Question Answer Comment  Walker: Other   Comments rollator   Patient needs a walker to treat with the following condition Generalized weakness      08/19/22 1440            Follow-up Information     [name], MD. Schedule an appointment as soon as possible for a visit in 1 week(s).   Specialty: Family Medicine Why: For hospital follow-up Contact information: [place] [address] [phone]         Irine Seal, MD. Schedule an appointment as soon as possible for a visit in 1 week(s).   Specialty: Urology Why: Acute urinary retention Contact information: [place] [address] [phone]                Discharge Exam: BP (!) 159/72 (BP Location: Right Arm)   Pulse  99   Temp 97.9 F (36.6 C) (Oral)   Resp 18   Ht '5\' 4"'$  (1.626 m)   Wt 53.5 kg   SpO2 96%   BMI 20.25 kg/m   General exam: Appears calm and comfortable Respiratory system: Mild wheeze. Respiratory effort normal. Cardiovascular system: S1 & S2 heard, RRR. Gastrointestinal system: Abdomen is nondistended, soft and nontender. Normal bowel sounds heard. Central nervous system: Alert and oriented. No focal neurological deficits. Musculoskeletal: No edema. No calf tenderness Skin: No cyanosis. No rashes Psychiatry: Judgement and insight appear normal. Mood & affect appropriate.   Condition at discharge: stable  The results of significant diagnostics from this hospitalization (including imaging, microbiology, ancillary and laboratory) are listed below for reference.   Imaging Studies: DG Lumbar Spine Complete  Result Date: 08/18/2022 CLINICAL DATA:  Chronic low back pain. History of compression fractures. EXAM: LUMBAR SPINE - COMPLETE 4+ VIEW COMPARISON:  07/05/2022 and more recently 08/15/2022. FINDINGS: The alignment of the lumbar spine is normal. There is a compression fracture involving the L1 vertebral body which has progressed when compared with 07/05/2022. There is now approximately 50% anterior vertebral body height loss. Compression fracture involving scratch set mild superior endplate compression fracture is noted at the L3 level with loss of approximately 10% of the vertebral body height. This is a new finding compared with 07/05/2022 but appears unchanged from 08/15/2022. There is mild disc space narrowing at L5-S1. Aortic atherosclerotic calcifications.  Gallstones. IMPRESSION: 1. Since 07/05/2022 there is been progression of L1 compression fracture with approximately 50% anterior vertebral body height loss. This is stable however compared with more recent study from 08/15/2022 2. Mild superior endplate compression fracture at L3, unchanged from 08/15/2022 but new compared with  07/05/2022. 3. Cholelithiasis. 4.  Aortic Atherosclerosis (ICD10-I70.0). Electronically Signed   By: Kerby Moors M.D.   On: 08/18/2022 14:15   DG Chest Portable 1 View  Result Date: 08/17/2022 CLINICAL DATA:  71 year old female with shortness of breath. EXAM: PORTABLE CHEST 1 VIEW COMPARISON:  Low-dose screening chest CT 07/27/2022 and earlier. FINDINGS: Portable AP semi upright view at 0907 hours. Pulmonary hyperinflation and emphysema as demonstrated on the CT last month. Calcified aortic atherosclerosis. Mediastinal contours remain normal. No pneumothorax, pulmonary edema, pleural effusion or acute pulmonary opacity. No acute osseous abnormality identified. IMPRESSION: Emphysema (ICD10-J43.9). No acute cardiopulmonary abnormality. Electronically Signed   By: Genevie Ann M.D.   On: 08/17/2022 09:16   CT CHEST LCS NODULE F/U LOW DOSE WO CONTRAST  Result Date: 07/30/2022 CLINICAL DATA:  Lung cancer screening. Former asymptomatic smoker. Fifty pack-year history. EXAM: CT  CHEST WITHOUT CONTRAST FOR LUNG CANCER SCREENING NODULE FOLLOW-UP TECHNIQUE: Multidetector CT imaging of the chest was performed following the standard protocol without IV contrast. RADIATION DOSE REDUCTION: This exam was performed according to the departmental dose-optimization program which includes automated exposure control, adjustment of the mA and/or kV according to patient size and/or use of iterative reconstruction technique. COMPARISON:  04/24/2022 FINDINGS: Cardiovascular: Normal heart size. Aortic and coronary artery atherosclerotic calcifications. No pericardial effusion. Mediastinum/Nodes: No enlarged mediastinal, hilar, or axillary lymph nodes. Thyroid gland, trachea, and esophagus demonstrate no significant findings. Lungs/Pleura: Severe centrilobular and paraseptal emphysema. Diffuse bronchial wall thickening. No airspace consolidation. There is been interval resolution of the previous left lower lobe subpleural nodule  measuring 7.2 mm compatible with a benign postinflammatory process. There is a new nodular density within the anteromedial left upper lobe which has a mean derived diameter of 7.4 mm. Upper Abdomen: No acute abnormality. Aortic atherosclerotic calcifications. Calcified splenic granulomas. Gallstones. Musculoskeletal: No acute or suspicious osseous findings. Remote left anterior rib fracture. Unchanged appearance of severe L1 compression fracture compared with 07/16/2021. Mild compression deformities at T11 and T12 are also unchanged when compared with 06/01/2022. IMPRESSION: 1. Lung-RADS 4A, suspicious. Follow up low-dose chest CT without contrast in 3 months (please use the following order, "CT CHEST LCS NODULE FOLLOW-UP W/O CM") is recommended. Alternatively, PET may be considered when there is a solid component 43m or larger. 2. Coronary artery calcifications. 3. Gallstones. Aortic Atherosclerosis (ICD10-I70.0) and Emphysema (ICD10-J43.9). Electronically Signed   By: TKerby MoorsM.D.   On: 07/30/2022 06:52   Microbiology: Results for orders placed or performed during the hospital encounter of 08/17/22  Resp Panel by RT-PCR (Flu A&B, Covid) Anterior Nasal Swab     Status: None   Collection Time: 08/17/22  8:42 AM   Specimen: Anterior Nasal Swab  Result Value Ref Range Status   SARS Coronavirus 2 by RT PCR NEGATIVE NEGATIVE Final    Comment: (NOTE) SARS-CoV-2 target nucleic acids are NOT DETECTED.  The SARS-CoV-2 RNA is generally detectable in upper respiratory specimens during the acute phase of infection. The lowest concentration of SARS-CoV-2 viral copies this assay can detect is 138 copies/mL. A negative result does not preclude SARS-Cov-2 infection and should not be used as the sole basis for treatment or other patient management decisions. A negative result may occur with  improper specimen collection/handling, submission of specimen other than nasopharyngeal swab, presence of viral  mutation(s) within the areas targeted by this assay, and inadequate number of viral copies(<138 copies/mL). A negative result must be combined with clinical observations, patient history, and epidemiological information. The expected result is Negative.  Fact Sheet for Patients:  hEntrepreneurPulse.com.au Fact Sheet for Healthcare Providers:  hIncredibleEmployment.be This test is no t yet approved or cleared by the [place] and  has been authorized for detection and/or diagnosis of SARS-CoV-2 by FDA under an Emergency Use Authorization (EUA). This EUA will remain  in effect (meaning this test can be used) for the duration of the COVID-19 declaration under Section 564(b)(1) of the Act, 21 U.S.C.section 360bbb-3(b)(1), unless the authorization is terminated  or revoked sooner.       Influenza A by PCR NEGATIVE NEGATIVE Final   Influenza B by PCR NEGATIVE NEGATIVE Final    Comment: (NOTE) The Xpert Xpress SARS-CoV-2/FLU/RSV plus assay is intended as an aid in the diagnosis of influenza from Nasopharyngeal swab specimens and should not be used as a sole basis for treatment. Nasal washings  and aspirates are unacceptable for Xpert Xpress SARS-CoV-2/FLU/RSV testing.  Fact Sheet for Patients: EntrepreneurPulse.com.au  Fact Sheet for Healthcare Providers: IncredibleEmployment.be  This test is not yet approved or cleared by the [place] FDA and has been authorized for detection and/or diagnosis of SARS-CoV-2 by FDA under an Emergency Use Authorization (EUA). This EUA will remain in effect (meaning this test can be used) for the duration of the COVID-19 declaration under Section 564(b)(1) of the Act, 21 U.S.C. section 360bbb-3(b)(1), unless the authorization is terminated or revoked.  Performed at [facility], [place] [address], [place], [place] [address]   Urine Culture      Status: None   Collection Time: 08/17/22  2:44 PM   Specimen: Urine, Catheterized  Result Value Ref Range Status   Specimen Description   Final    URINE, CATHETERIZED Performed at [facility] [address], [place], [place] [address]    Special Requests   Final    NONE Performed at [facility], [place] [address] [place], [place] [address]    Culture   Final    NO GROWTH Performed at [facility], [place] [address], [place],  [address]    Report Status 08/18/2022 FINAL  Final    Labs: CBC: Recent Labs  Lab 08/17/22 0842 08/18/22 0417  WBC 10.2 7.9  NEUTROABS 8.3*  --   HGB 11.5* 10.2*  HCT 36.0 32.5*  MCV 100.6* 100.9*  PLT 290 492   Basic Metabolic Panel: Recent Labs  Lab 08/17/22 0942 08/18/22 0417  NA 141 142  K 3.5 4.4  CL 108 111  CO2 28 25  GLUCOSE 109* 129*  BUN 12 21  CREATININE 0.47 0.51  CALCIUM 8.4* 9.1   Liver Function Tests: Recent Labs  Lab 08/17/22 0942 08/18/22 0417  AST 15 15  ALT 10 11  ALKPHOS 92 89  BILITOT 0.6 0.7  PROT 6.5 6.3*  ALBUMIN 3.4* 3.3*   CBG: Recent Labs  Lab 08/17/22 1125  GLUCAP 97    Discharge time spent: 35 minutes.  Signed: Cordelia Poche, MD [facility] 08/20/2022

## 2022-08-21 ENCOUNTER — Ambulatory Visit: Payer: Medicare HMO | Admitting: Nurse Practitioner

## 2022-08-21 ENCOUNTER — Telehealth: Payer: Self-pay | Admitting: Emergency Medicine

## 2022-08-21 ENCOUNTER — Telehealth: Payer: Self-pay

## 2022-08-21 NOTE — Telephone Encounter (Signed)
Copied from [place] [id number]. Topic: General - Other >> Aug 21, 2022  1:24 PM Leitha Schuller wrote: Caller needing to verify if pt is a current pt and additional information  Please assist further

## 2022-08-21 NOTE — Patient Outreach (Signed)
  Care Coordination TOC Note Transition Care Management Follow-up Telephone Call Date of discharge and from where: 08/20/22-[facility] How have you been since you were released from the hospital? Patient reports she is doing well since returning home yesterday. She rested well and has ben up moving around and getting ready to eat breakfast. She denies any SOB at present-wearing oxygen 24/7. Any questions or concerns? Yes-Patient states she does not know why she is on high dosage of Prednisone. She is concerned about the affects of med on her bones. Discussed with patient that she is on tapering dose and explained the purpose of med in helping manage resp status. She voices understanding. She will discuss any further concerns with MD.   Items Reviewed: Did the pt receive and understand the discharge instructions provided? Yes  Medications obtained and verified? Yes  Other? Yes  Any new allergies since your discharge? No  Dietary orders reviewed? Yes Do you have support at home? Yes   Home Care and Equipment/Supplies: Were home health services ordered? yes If so, what is the name of the agency? [place]  Has the agency set up a time to come to the patient's home? Yes-patient states she spoke with Gracie at agency and they will call her back with visit date/time Were any new equipment or medical supplies ordered?  No What is the name of the medical supply agency? N/A Were you able to get the supplies/equipment? not applicable Do you have any questions related to the use of the equipment or supplies? No  Functional Questionnaire: (I = Independent and D = Dependent) ADLs: I  Bathing/Dressing- I  Meal Prep- I  Eating- I  Maintaining continence- I  Transferring/Ambulation- I  Managing Meds- I  Follow up appointments reviewed:  PCP Hospital f/u appt confirmed? Patient called office and is awaiting a return call for appt date/time. [facility] f/u appt confirmed?   Patient called office and is awaiting a return call for appt date/time . Are transportation arrangements needed? No  If their condition worsens, is the pt aware to call PCP or go to the Emergency Dept.? Yes Was the patient provided with contact information for the PCP's office or ED? Yes Was to pt encouraged to call back with questions or concerns? Yes  SDOH assessments and interventions completed:   Yes  Care Coordination Interventions Activated:  Yes   Care Coordination Interventions:  Education provided    Encounter Outcome:  Pt. Visit Completed    Enzo Montgomery, RN,BSN,CCM [place] Management Telephonic Care Management Coordinator Direct Phone: [phone] Toll Free: [phone] Fax: [phone]

## 2022-08-23 ENCOUNTER — Telehealth: Payer: Self-pay | Admitting: Family Medicine

## 2022-08-23 NOTE — Telephone Encounter (Unsigned)
Copied from [place] [phone]. Topic: Quick Communication - Home Health Verbal Orders >> Aug 23, 2022 12:01 PM Cyndi Bender wrote: Caller/Agency: Ig[facility] with [facility] Callback Number: [phone] Requesting OT/PT/Skilled Nursing/Social Work/Speech Therapy: skilled nursing Frequency: 1 x 3 weeks and 1 x a week every other week for 4 weeks. Also requests 1 PRN visit

## 2022-08-23 NOTE — Telephone Encounter (Signed)
Verbal orders were given for the patient

## 2022-08-27 ENCOUNTER — Ambulatory Visit: Payer: Self-pay

## 2022-08-27 ENCOUNTER — Ambulatory Visit: Payer: Medicare HMO | Attending: Family Medicine

## 2022-08-27 DIAGNOSIS — M810 Age-related osteoporosis without current pathological fracture: Secondary | ICD-10-CM

## 2022-08-27 DIAGNOSIS — R3914 Feeling of incomplete bladder emptying: Secondary | ICD-10-CM | POA: Diagnosis not present

## 2022-08-27 NOTE — Telephone Encounter (Signed)
  Chief Complaint: urinary retention Symptoms: urinary discomfort, urgency, frequency Frequency: ongoing since 08/17/22 Pertinent Negatives: NA Disposition: '[x]'$ ED /'[]'$ Urgent Care (no appt availability in office) / '[]'$ Appointment(In office/virtual)/ '[]'$  [place] [facility]/ '[]'$ Home Care/ '[]'$ Refused Recommended Disposition /'[]'$ [place] [facility]/ '[]'$  Follow-up with PCP Additional Notes: pt states she was told in ED that she has acute urinary retention and was being sent home with cath in place but pt didn't feel comfortable coming home with it so they advised her if sx returned she may have to come in for I&O cath at ED. Pt has appt with Urology on 09/12/22 so pt was asking if she needed I&O cath can she come in office. I spoke with Carilyn Goodpasture, RN and she advised that they dont do that in the office and pt would have to go to ED. I advised pt of this information and also encouraged her to call Urology back to see if they can work her in sooner or if they could do I&O cath prior to appt. Also recommended pt can call UC to see if they are capable as well since pt doesn't want to go back to ED if she doesn't have to.   Summary: Catherization Advice   Pt is calling to report that she having vaginal discomfort and urge to go. Pt was told in the hospital that her bladder is not emptying. Pt has appt with urology 09/12/22. Pt would like to know can she be catherized in the office? Please advise     Reason for Disposition  All other urine symptoms  Answer Assessment - Initial Assessment Questions 1. SYMPTOM: "What's the main symptom you're concerned about?" (e.g., frequency, incontinence)     Urgency and pain  2. ONSET: "When did the  sx  start?"     Ongoing 08/17/22 3. PAIN: "Is there any pain?" If Yes, ask: "How bad is it?" (Scale: 1-10; mild, moderate, severe)     Yes in lower abdomen  4. CAUSE: "What do you think is causing the symptoms?"     Urinary retention  5. OTHER SYMPTOMS: "Do you have any other  symptoms?" (e.g., blood in urine, fever, flank pain, pain with urination)     Pain with urination and urgency  Protocols used: Urinary Symptoms-A-AH

## 2022-08-28 ENCOUNTER — Other Ambulatory Visit: Payer: Self-pay | Admitting: Family Medicine

## 2022-08-28 DIAGNOSIS — E2749 Other adrenocortical insufficiency: Secondary | ICD-10-CM

## 2022-08-28 LAB — PTH, INTACT AND CALCIUM
Calcium: 9.1 mg/dL (ref 8.7–10.3)
PTH: 35 pg/mL (ref 15–65)

## 2022-08-28 LAB — CORTISOL: Cortisol: 3.3 ug/dL — ABNORMAL LOW (ref 6.2–19.4)

## 2022-08-28 LAB — VITAMIN D 25 HYDROXY (VIT D DEFICIENCY, FRACTURES): Vit D, 25-Hydroxy: 48 ng/mL (ref 30.0–100.0)

## 2022-09-03 IMAGING — DX DG CHEST 2V
2 series · 2 of 2 positions shown · non-contrast
Comparison: Chest radiograph January 10, 2022.

CLINICAL DATA: History of COPD.

EXAM:
CHEST - 2 VIEW

[chest pa]
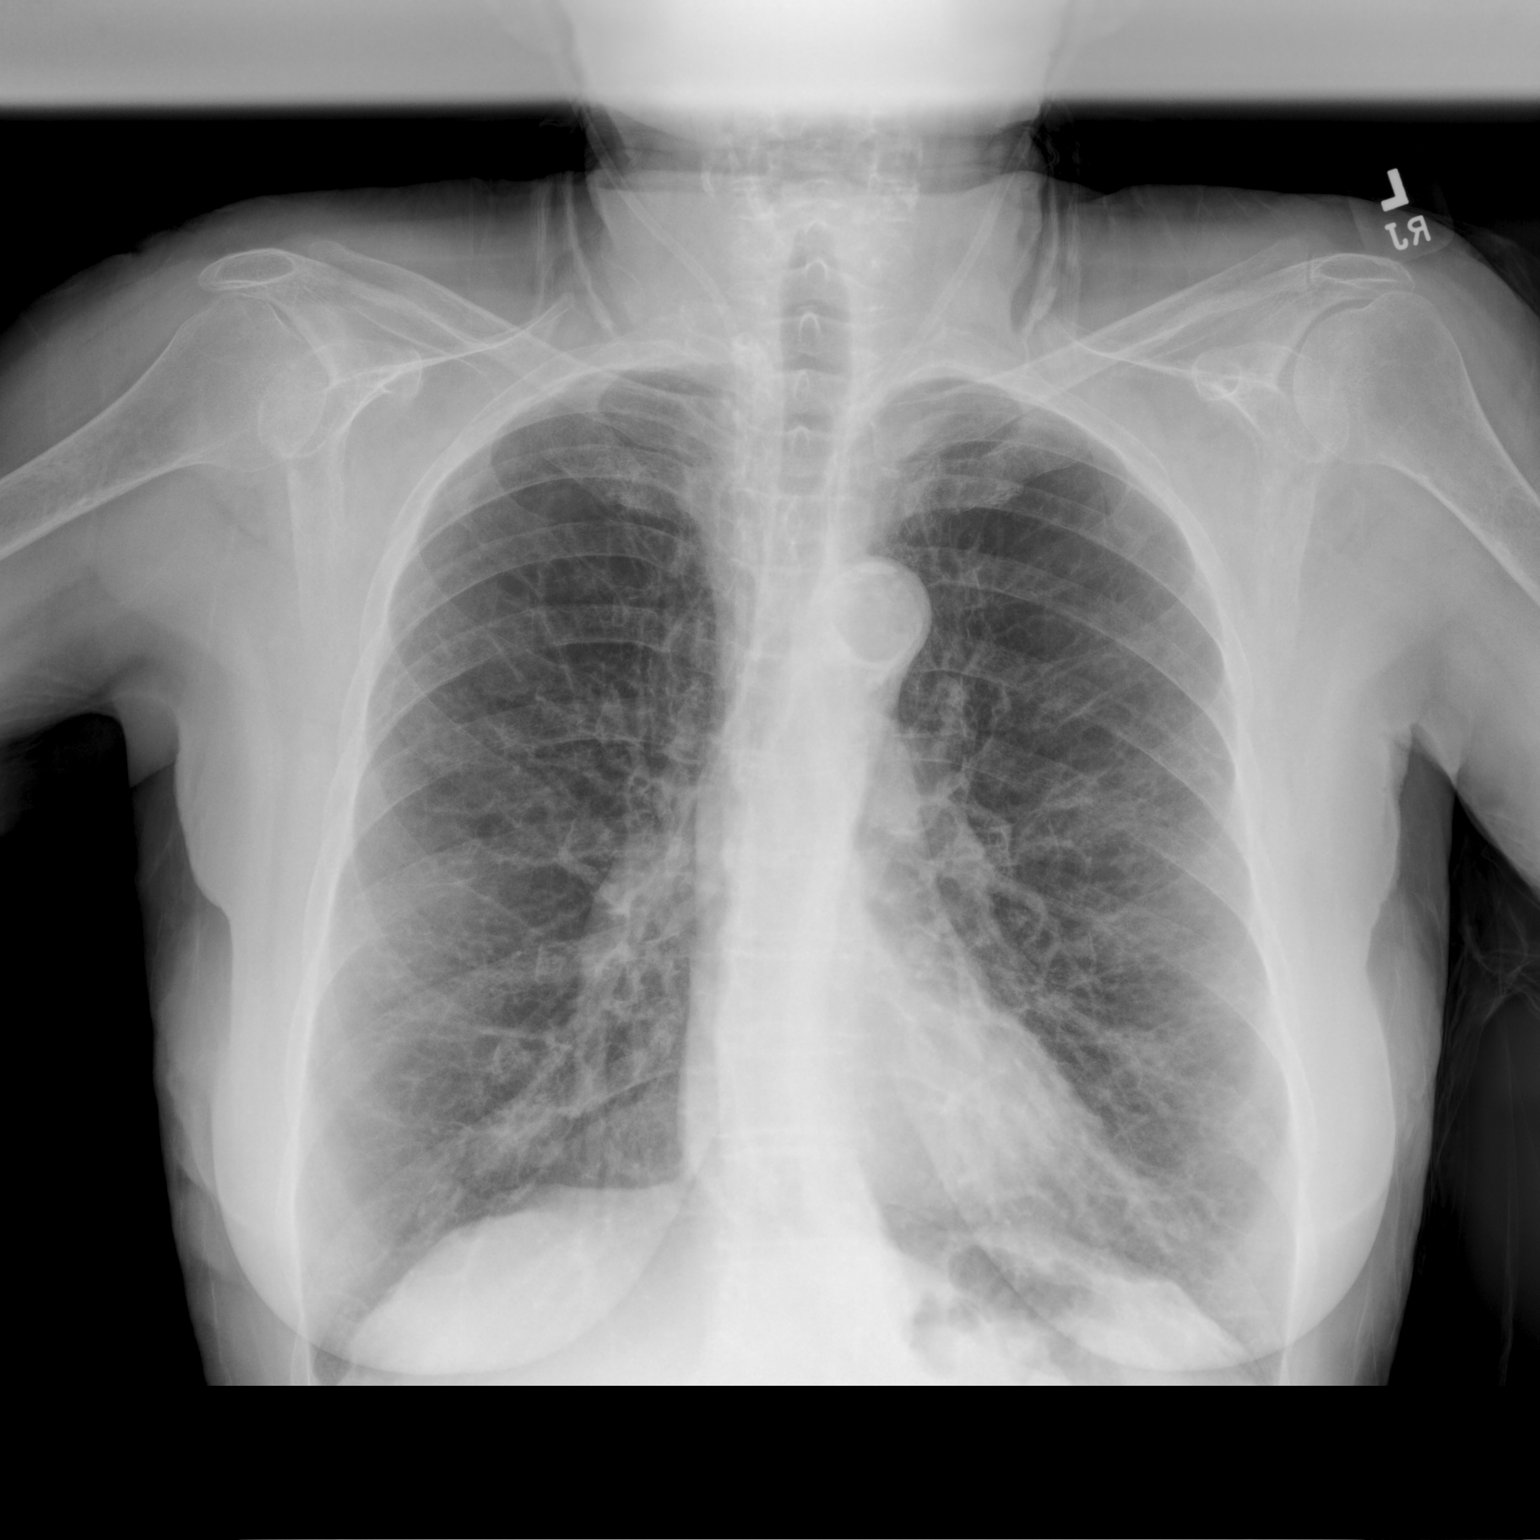

[chest lat]
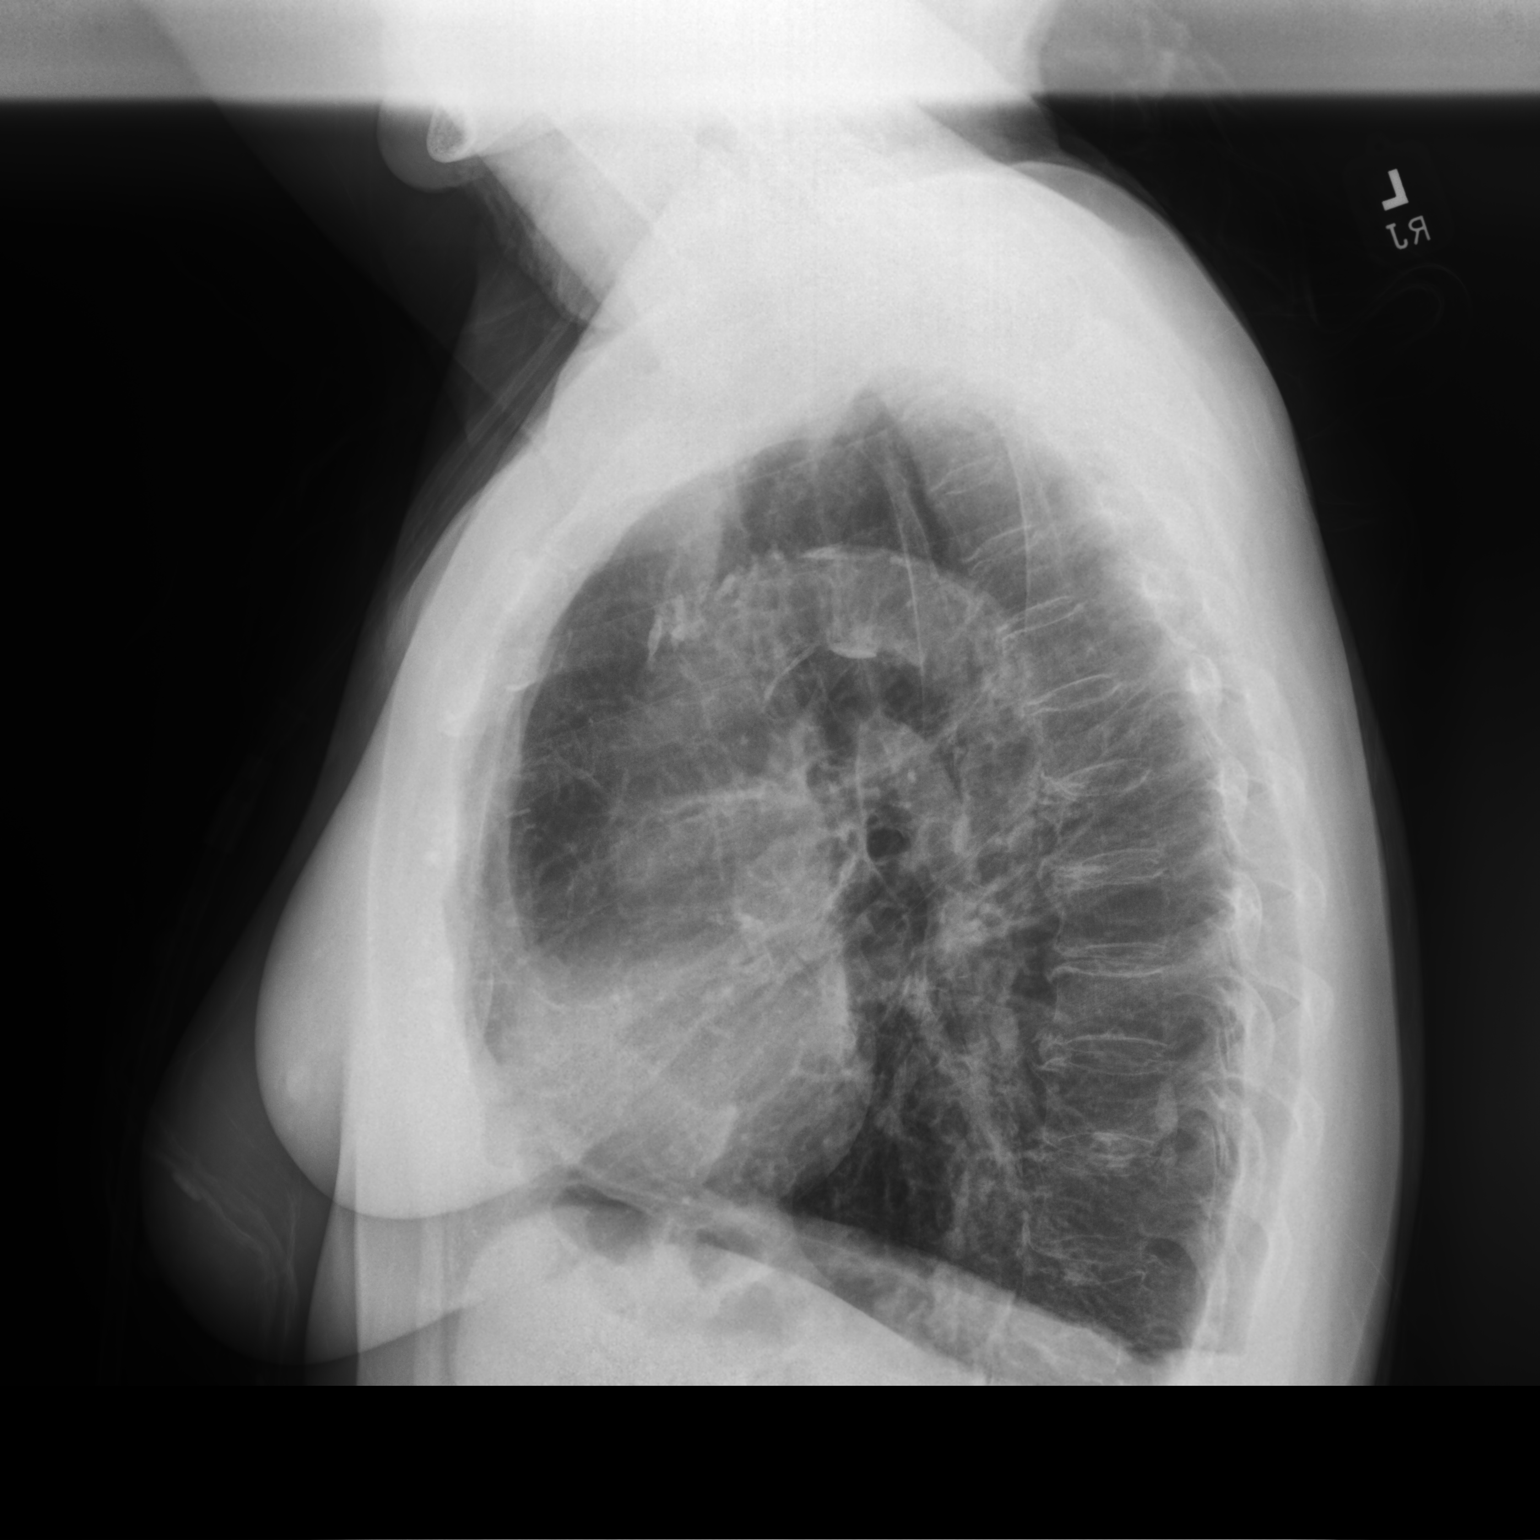

[2 of 2 positions shown; findings below may reference images not displayed]

FINDINGS: Stable cardiac and mediastinal contours. Aortic atherosclerosis.
Emphysematous changes. Patchy area of consolidation right lung base.
No pleural effusion or pneumothorax. Thoracic spine degenerative
changes.
IMPRESSION: Patchy consolidation right lung base may represent atelectasis.
Underlying infection not excluded. Recommend short-term follow-up
chest radiograph to ensure resolution.

## 2022-09-03 NOTE — Telephone Encounter (Signed)
Gracee calling with [facility] called and stated that patient has declined PT at this time. Can not tolerate PT   She would like verbal orders for medical social worker. Please advise   [phone]

## 2022-09-03 NOTE — Telephone Encounter (Signed)
FYI

## 2022-09-04 NOTE — Telephone Encounter (Signed)
Okay to give verbal orders for Education officer, museum.

## 2022-09-04 NOTE — Telephone Encounter (Signed)
Call paced to Gracee and VM was left informing her to return phone call for verbal orders.

## 2022-09-13 ENCOUNTER — Other Ambulatory Visit: Payer: Self-pay | Admitting: Radiation Oncology

## 2022-09-13 ENCOUNTER — Other Ambulatory Visit (HOSPITAL_BASED_OUTPATIENT_CLINIC_OR_DEPARTMENT_OTHER): Payer: Self-pay

## 2022-09-13 ENCOUNTER — Other Ambulatory Visit: Payer: Self-pay | Admitting: Family Medicine

## 2022-09-13 DIAGNOSIS — M545 Low back pain, unspecified: Secondary | ICD-10-CM

## 2022-09-13 MED ORDER — MELOXICAM 7.5 MG PO TABS
7.5000 mg | ORAL_TABLET | Freq: Every day | ORAL | 0 refills | Status: DC
  Filled 2022-09-13: qty 90, 90d supply, fill #0

## 2022-09-13 NOTE — Telephone Encounter (Signed)
Requested Prescriptions  Pending Prescriptions Disp Refills   meloxicam (MOBIC) 7.5 MG tablet 90 tablet 0    Sig: Take 1 tablet (7.5 mg total) by mouth daily.     Analgesics:  COX2 Inhibitors Failed - 09/13/2022  9:19 AM      Failed - Manual Review: Labs are only required if the patient has taken medication for more than 8 weeks.      Failed - HGB in normal range and within 360 days    Hemoglobin  Date Value Ref Range Status  08/18/2022 10.2 (L) 12.0 - 15.0 g/dL Final  04/17/2022 11.5 11.1 - 15.9 g/dL Final         Failed - HCT in normal range and within 360 days    HCT  Date Value Ref Range Status  08/18/2022 32.5 (L) 36.0 - 46.0 % Final   Hematocrit  Date Value Ref Range Status  04/17/2022 38.7 34.0 - 46.6 % Final         Passed - Cr in normal range and within 360 days    Creat  Date Value Ref Range Status  09/05/2016 0.60 0.50 - 0.99 mg/dL Final    Comment:      For patients > or = 72 years of age: The upper reference limit for Creatinine is approximately 13% higher for people identified as African-American.      Creatinine, Ser  Date Value Ref Range Status  08/18/2022 0.51 0.44 - 1.00 mg/dL Final         Passed - AST in normal range and within 360 days    AST  Date Value Ref Range Status  08/18/2022 15 15 - 41 U/L Final         Passed - ALT in normal range and within 360 days    ALT  Date Value Ref Range Status  08/18/2022 11 0 - 44 U/L Final         Passed - eGFR is 30 or above and within 360 days    GFR, Est African American  Date Value Ref Range Status  03/26/2016 >89 >=60 mL/min Final   GFR calc Af Amer  Date Value Ref Range Status  11/18/2020 109 >59 mL/min/1.73 Final    Comment:    **In accordance with recommendations from the NKF-ASN Task force,**   [facility] is in the process of updating its eGFR calculation to the   2021 CKD-EPI creatinine equation that estimates kidney function   without a race variable.    GFR, Est Non African American   Date Value Ref Range Status  03/26/2016 >89 >=60 mL/min Final   GFR, Estimated  Date Value Ref Range Status  08/18/2022 >60 >60 mL/min Final    Comment:    (NOTE) Calculated using the CKD-EPI Creatinine Equation (2021)    GFR  Date Value Ref Range Status  01/30/2022 91.65 >60.00 mL/min Final    Comment:    Calculated using the CKD-EPI Creatinine Equation (2021)   eGFR  Date Value Ref Range Status  07/04/2022 96 >59 mL/min/1.73 Final         Passed - Patient is not pregnant      Passed - Valid encounter within last 12 months    Recent Outpatient Visits           2 months ago Lumbar spine pain   [place], Charlane Ferretti, MD   4 months ago Anemia, unspecified type   [facility] [name], MD   1 year ago COPD, group D, by GOLD 2017 classification [facility])   [place], MD   1 year ago History of COVID-19   [facility], Kriste Basque, NP   1 year ago Anemia, unspecified type   [place], [place], MD       Future Appointments             Tomorrow Laurance Flatten, Venia Carbon, MD [place]   In 3 weeks [name], MD [place]   In 1 month Marshell Garfinkel, MD [facility]   In 2 months Agustin Cree, Marily Lente, MD [place] [facility] [place]   In 3 months [name], MD [place]

## 2022-09-14 ENCOUNTER — Other Ambulatory Visit (HOSPITAL_BASED_OUTPATIENT_CLINIC_OR_DEPARTMENT_OTHER): Payer: Self-pay

## 2022-09-14 ENCOUNTER — Inpatient Hospital Stay (HOSPITAL_COMMUNITY)
Admission: EM | Admit: 2022-09-14 | Discharge: 2022-09-21 | DRG: 190 | Disposition: A | Discharge status: Home Health | Payer: Medicare HMO | Attending: Internal Medicine | Admitting: Internal Medicine

## 2022-09-14 ENCOUNTER — Other Ambulatory Visit: Payer: Self-pay | Admitting: Student

## 2022-09-14 ENCOUNTER — Ambulatory Visit (INDEPENDENT_AMBULATORY_CARE_PROVIDER_SITE_OTHER): Payer: Medicare HMO

## 2022-09-14 ENCOUNTER — Other Ambulatory Visit: Payer: Self-pay | Admitting: Pulmonary Disease

## 2022-09-14 ENCOUNTER — Other Ambulatory Visit: Payer: Self-pay

## 2022-09-14 ENCOUNTER — Ambulatory Visit (INDEPENDENT_AMBULATORY_CARE_PROVIDER_SITE_OTHER): Payer: Medicare HMO | Admitting: Orthopedic Surgery

## 2022-09-14 ENCOUNTER — Emergency Department (HOSPITAL_COMMUNITY): Payer: Medicare HMO

## 2022-09-14 DIAGNOSIS — Z86010 Personal history of colonic polyps: Secondary | ICD-10-CM | POA: Diagnosis not present

## 2022-09-14 DIAGNOSIS — M545 Low back pain, unspecified: Secondary | ICD-10-CM

## 2022-09-14 DIAGNOSIS — G8929 Other chronic pain: Secondary | ICD-10-CM | POA: Diagnosis not present

## 2022-09-14 DIAGNOSIS — I10 Essential (primary) hypertension: Secondary | ICD-10-CM | POA: Diagnosis not present

## 2022-09-14 DIAGNOSIS — E7800 Pure hypercholesterolemia, unspecified: Secondary | ICD-10-CM | POA: Diagnosis not present

## 2022-09-14 DIAGNOSIS — Z90711 Acquired absence of uterus with remaining cervical stump: Secondary | ICD-10-CM

## 2022-09-14 DIAGNOSIS — Z825 Family history of asthma and other chronic lower respiratory diseases: Secondary | ICD-10-CM | POA: Diagnosis not present

## 2022-09-14 DIAGNOSIS — J441 Chronic obstructive pulmonary disease with (acute) exacerbation: Secondary | ICD-10-CM | POA: Diagnosis not present

## 2022-09-14 DIAGNOSIS — Z885 Allergy status to narcotic agent status: Secondary | ICD-10-CM

## 2022-09-14 DIAGNOSIS — Z7951 Long term (current) use of inhaled steroids: Secondary | ICD-10-CM

## 2022-09-14 DIAGNOSIS — Z7952 Long term (current) use of systemic steroids: Secondary | ICD-10-CM | POA: Diagnosis not present

## 2022-09-14 DIAGNOSIS — R000 Tachycardia, unspecified: Secondary | ICD-10-CM | POA: Diagnosis not present

## 2022-09-14 DIAGNOSIS — M4856XA Collapsed vertebra, not elsewhere classified, lumbar region, initial encounter for fracture: Secondary | ICD-10-CM | POA: Diagnosis not present

## 2022-09-14 DIAGNOSIS — Z20822 Contact with and (suspected) exposure to covid-19: Secondary | ICD-10-CM | POA: Diagnosis present

## 2022-09-14 DIAGNOSIS — J439 Emphysema, unspecified: Principal | ICD-10-CM | POA: Diagnosis present

## 2022-09-14 DIAGNOSIS — E785 Hyperlipidemia, unspecified: Secondary | ICD-10-CM | POA: Diagnosis not present

## 2022-09-14 DIAGNOSIS — Z85828 Personal history of other malignant neoplasm of skin: Secondary | ICD-10-CM

## 2022-09-14 DIAGNOSIS — Z9981 Dependence on supplemental oxygen: Secondary | ICD-10-CM | POA: Diagnosis not present

## 2022-09-14 DIAGNOSIS — R0603 Acute respiratory distress: Secondary | ICD-10-CM | POA: Diagnosis not present

## 2022-09-14 DIAGNOSIS — Z79899 Other long term (current) drug therapy: Secondary | ICD-10-CM

## 2022-09-14 DIAGNOSIS — M810 Age-related osteoporosis without current pathological fracture: Secondary | ICD-10-CM | POA: Diagnosis present

## 2022-09-14 DIAGNOSIS — J9621 Acute and chronic respiratory failure with hypoxia: Secondary | ICD-10-CM | POA: Diagnosis not present

## 2022-09-14 DIAGNOSIS — Z9851 Tubal ligation status: Secondary | ICD-10-CM | POA: Diagnosis not present

## 2022-09-14 DIAGNOSIS — Z90722 Acquired absence of ovaries, bilateral: Secondary | ICD-10-CM

## 2022-09-14 DIAGNOSIS — Z8701 Personal history of pneumonia (recurrent): Secondary | ICD-10-CM

## 2022-09-14 DIAGNOSIS — K219 Gastro-esophageal reflux disease without esophagitis: Secondary | ICD-10-CM | POA: Diagnosis present

## 2022-09-14 DIAGNOSIS — S32030G Wedge compression fracture of third lumbar vertebra, subsequent encounter for fracture with delayed healing: Secondary | ICD-10-CM

## 2022-09-14 DIAGNOSIS — R32 Unspecified urinary incontinence: Secondary | ICD-10-CM | POA: Diagnosis present

## 2022-09-14 DIAGNOSIS — R0602 Shortness of breath: Secondary | ICD-10-CM | POA: Diagnosis not present

## 2022-09-14 DIAGNOSIS — J9611 Chronic respiratory failure with hypoxia: Secondary | ICD-10-CM | POA: Diagnosis present

## 2022-09-14 DIAGNOSIS — Z87891 Personal history of nicotine dependence: Secondary | ICD-10-CM

## 2022-09-14 LAB — CBC WITH DIFFERENTIAL/PLATELET
Abs Immature Granulocytes: 0.07 10*3/uL (ref 0.00–0.07)
Basophils Absolute: 0.1 10*3/uL (ref 0.0–0.1)
Basophils Relative: 1 %
Eosinophils Absolute: 0.3 10*3/uL (ref 0.0–0.5)
Eosinophils Relative: 3 %
HCT: 38.7 % (ref 36.0–46.0)
Hemoglobin: 12.2 g/dL (ref 12.0–15.0)
Immature Granulocytes: 1 %
Lymphocytes Relative: 16 %
Lymphs Abs: 1.8 10*3/uL (ref 0.7–4.0)
MCH: 31.7 pg (ref 26.0–34.0)
MCHC: 31.5 g/dL (ref 30.0–36.0)
MCV: 100.5 fL — ABNORMAL HIGH (ref 80.0–100.0)
Monocytes Absolute: 1.3 10*3/uL — ABNORMAL HIGH (ref 0.1–1.0)
Monocytes Relative: 12 %
Neutro Abs: 7.5 10*3/uL (ref 1.7–7.7)
Neutrophils Relative %: 67 %
Platelets: 277 10*3/uL (ref 150–400)
RBC: 3.85 MIL/uL — ABNORMAL LOW (ref 3.87–5.11)
RDW: 13.2 % (ref 11.5–15.5)
WBC: 11 10*3/uL — ABNORMAL HIGH (ref 4.0–10.5)
nRBC: 0 % (ref 0.0–0.2)

## 2022-09-14 LAB — BASIC METABOLIC PANEL
Anion gap: 9 (ref 5–15)
BUN: 10 mg/dL (ref 8–23)
CO2: 26 mmol/L (ref 22–32)
Calcium: 8.9 mg/dL (ref 8.9–10.3)
Chloride: 108 mmol/L (ref 98–111)
Creatinine, Ser: 0.34 mg/dL — ABNORMAL LOW (ref 0.44–1.00)
GFR, Estimated: >60 mL/min (ref >60)
Glucose, Bld: 109 mg/dL — ABNORMAL HIGH (ref 70–99)
Potassium: 3.8 mmol/L (ref 3.5–5.1)
Sodium: 143 mmol/L (ref 135–145)

## 2022-09-14 LAB — RESP PANEL BY RT-PCR (RSV, FLU A&B, COVID)  RVPGX2
Influenza A by PCR: NEGATIVE
Influenza B by PCR: NEGATIVE
Resp Syncytial Virus by PCR: NEGATIVE
SARS Coronavirus 2 by RT PCR: NEGATIVE

## 2022-09-14 MED ORDER — IPRATROPIUM-ALBUTEROL 0.5-2.5 (3) MG/3ML IN SOLN
3.0000 mL | Freq: Four times a day (QID) | RESPIRATORY_TRACT | Status: DC
  Administered 2022-09-14 – 2022-09-15 (×4): 3 mL via RESPIRATORY_TRACT
  Filled 2022-09-14 (×3): qty 3

## 2022-09-14 MED ORDER — ENOXAPARIN SODIUM 40 MG/0.4ML IJ SOSY
40.0000 mg | PREFILLED_SYRINGE | INTRAMUSCULAR | Status: DC
  Administered 2022-09-14 – 2022-09-20 (×7): 40 mg via SUBCUTANEOUS
  Filled 2022-09-14 (×7): qty 0.4

## 2022-09-14 MED ORDER — SENNOSIDES-DOCUSATE SODIUM 8.6-50 MG PO TABS: 1.0000 | ORAL_TABLET | Freq: Every evening | ORAL | Status: DC | PRN

## 2022-09-14 MED ORDER — ACETAMINOPHEN 325 MG PO TABS: 650.0000 mg | ORAL_TABLET | Freq: Four times a day (QID) | ORAL | Status: DC | PRN

## 2022-09-14 MED ORDER — ACETAMINOPHEN 650 MG RE SUPP: 650.0000 mg | Freq: Four times a day (QID) | RECTAL | Status: DC | PRN

## 2022-09-14 MED ORDER — ONDANSETRON HCL 4 MG/2ML IJ SOLN: 4.0000 mg | Freq: Four times a day (QID) | INTRAMUSCULAR | Status: DC | PRN

## 2022-09-14 MED ORDER — AZITHROMYCIN 250 MG PO TABS
250.0000 mg | ORAL_TABLET | Freq: Every day | ORAL | 1 refills | Status: DC
  Filled 2022-09-14: qty 30, 30d supply, fill #0
  Filled 2022-10-23: qty 30, 30d supply, fill #1

## 2022-09-14 MED ORDER — ALBUTEROL SULFATE (2.5 MG/3ML) 0.083% IN NEBU
5.0000 mg | INHALATION_SOLUTION | Freq: Once | RESPIRATORY_TRACT | Status: DC
  Filled 2022-09-14: qty 6

## 2022-09-14 MED ORDER — TIOTROPIUM BROMIDE MONOHYDRATE 2.5 MCG/ACT IN AERS: 2.0000 | INHALATION_SPRAY | Freq: Every day | RESPIRATORY_TRACT | Status: DC

## 2022-09-14 MED ORDER — METHYLPREDNISOLONE SODIUM SUCC 40 MG IJ SOLR
40.0000 mg | Freq: Two times a day (BID) | INTRAMUSCULAR | Status: DC
  Administered 2022-09-14: 40 mg via INTRAVENOUS
  Filled 2022-09-14: qty 1

## 2022-09-14 MED ORDER — BENZONATATE 100 MG PO CAPS: 200.0000 mg | ORAL_CAPSULE | Freq: Three times a day (TID) | ORAL | Status: DC | PRN

## 2022-09-14 MED ORDER — ONDANSETRON HCL 4 MG PO TABS: 4.0000 mg | ORAL_TABLET | Freq: Four times a day (QID) | ORAL | Status: DC | PRN

## 2022-09-14 MED ORDER — TRAMADOL HCL 50 MG PO TABS
50.0000 mg | ORAL_TABLET | Freq: Four times a day (QID) | ORAL | Status: DC | PRN
  Administered 2022-09-15 – 2022-09-20 (×9): 50 mg via ORAL
  Filled 2022-09-14 (×10): qty 1

## 2022-09-14 MED ORDER — AZITHROMYCIN 250 MG PO TABS
250.0000 mg | ORAL_TABLET | Freq: Every day | ORAL | Status: DC
  Administered 2022-09-15 – 2022-09-21 (×7): 250 mg via ORAL
  Filled 2022-09-14 (×7): qty 1

## 2022-09-14 MED ORDER — FLUTICASONE FUROATE-VILANTEROL 200-25 MCG/ACT IN AEPB
1.0000 | INHALATION_SPRAY | Freq: Every day | RESPIRATORY_TRACT | Status: DC
  Filled 2022-09-14: qty 28

## 2022-09-14 MED ORDER — LEVALBUTEROL HCL 0.63 MG/3ML IN NEBU: 0.6300 mg | INHALATION_SOLUTION | Freq: Four times a day (QID) | RESPIRATORY_TRACT | Status: DC | PRN

## 2022-09-14 MED ORDER — UMECLIDINIUM BROMIDE 62.5 MCG/ACT IN AEPB
1.0000 | INHALATION_SPRAY | Freq: Every day | RESPIRATORY_TRACT | Status: DC
  Filled 2022-09-14: qty 7

## 2022-09-14 MED ORDER — ATORVASTATIN CALCIUM 40 MG PO TABS
40.0000 mg | ORAL_TABLET | Freq: Every day | ORAL | Status: DC
  Administered 2022-09-15 – 2022-09-21 (×7): 40 mg via ORAL
  Filled 2022-09-14 (×7): qty 1

## 2022-09-14 NOTE — ED Provider Notes (Signed)
[facility] [facility] DEPT Provider Note   CSN: [id number] Arrival date & time: 09/14/22  1653     History  Chief Complaint  Patient presents with   Shortness of Breath    Margaret Henry is a 71 y.o. female.   Shortness of Breath Patient presents with shortness of breath.  History of COPD on chronic 3 L at home.  Reportedly became more short of breath today.  Did see back doctor earlier today and was not having shortness of breath at time.  No more short of breath.  No fevers.  No cough.  No swelling in her legs.  States she just is more short of breath and her heart rate is not coming down. Received Solu-Medrol and DuoNeb by EMS.    Home Medications Prior to Admission medications   Medication Sig Start Date End Date Taking? Authorizing Provider  azithromycin (ZITHROMAX) 250 MG tablet Take 1 tablet (250 mg total) by mouth daily. 09/14/22  Yes Mannam, Praveen, MD  acetaminophen (TYLENOL) 500 MG tablet Take 1 tablet (500 mg total) by mouth every 4 (four) hours as needed for headache. 08/20/22   Mariel Aloe, MD  albuterol (PROVENTIL) (2.5 MG/3ML) 0.083% nebulizer solution Take 2.5 mg by nebulization every 6 (six) hours as needed for wheezing or shortness of breath.    [provider]  alendronate (FOSAMAX) 70 MG tablet Take 1 tablet (70 mg total) by mouth every 7 (seven) days. Take with a full glass of water on an empty stomach. Patient taking differently: Take 70 mg by mouth every 7 (seven) days. Take with a full glass of water on an empty stomach. Sunday 07/09/22   [name], MD  aspirin EC (ASPIRIN ADULT LOW STRENGTH) 81 MG tablet Take 1 tablet (81 mg total) by mouth at bedtime. Patient taking differently: Take 81 mg by mouth every other day. 04/26/22   [place] Eric J, DO  atorvastatin (LIPITOR) 40 MG tablet TAKE 1 TABLET EVERY DAY Patient taking differently: Take 40 mg by mouth daily. 12/15/21   Park Liter, MD  benzonatate (TESSALON)  200 MG capsule Take 1 capsule (200 mg total) by mouth 3 (three) times daily as needed for cough. 05/31/22   Cobb, [name], NP  bismuth subsalicylate (PEPTO BISMOL) 262 MG/15ML suspension Take 30 mLs by mouth every 6 (six) hours as needed for indigestion.    [provider]  BREO ELLIPTA 200-25 MCG/ACT AEPB Inhale 1 puff into the lungs daily. 04/05/22   [provider]  cyanocobalamin (VITAMIN B12) 1000 MCG tablet Take 1 tablet (1,000 mcg total) by mouth daily. 04/12/22 10/29/22  [place] Eric J, DO  ferrous sulfate 325 (65 FE) MG tablet Take 1 tablet (325 mg total) by mouth 2 (two) times daily with a meal. Patient taking differently: Take 325 mg by mouth daily with breakfast. 04/12/22 09/14/22  [place] Eric J, DO  fluticasone (FLONASE) 50 MCG/ACT nasal spray USE 2 SPRAYS IN EACH NOSTRIL EVERY DAY Patient taking differently: Place 2 sprays into both nostrils daily as needed for allergies. 02/11/20   [name], MD  gabapentin (NEURONTIN) 100 MG capsule Take 1 capsule (100 mg total) by mouth 3 (three) times daily. 08/15/22 09/15/22  Callie Fielding, MD  ipratropium-albuterol (DUONEB) 0.5-2.5 (3) MG/3ML SOLN INHALE THE CONTENTS OF 1 VIAL VIA NEBULIZER EVERY 6 HOURS AS NEEDED Patient taking differently: Take 3 mLs by nebulization every 6 (six) hours as needed (shortness of breath). 06/19/22   [name], MD  loratadine (CLARITIN) 10 MG tablet Take 1 tablet (10 mg total) by mouth daily. 04/17/22   [name], MD  meloxicam (MOBIC) 7.5 MG tablet Take 1 tablet (7.5 mg total) by mouth daily. 09/13/22   [name], MD  mupirocin ointment (BACTROBAN) 2 % Apply 1 Application topically 2 (two) times daily. To affected nostril 05/31/22   Cobb, [name], NP  OXYGEN Inhale 3 L into the lungs daily.    [provider]  predniSONE (DELTASONE) 10 MG tablet Take 3 tablets (30 mg total) by mouth daily with breakfast for 3 days, THEN 2 tablets (20 mg total) daily with breakfast for  3 days, THEN 1 tablet (10 mg total) daily with breakfast. 08/20/22 09/25/22  Mariel Aloe, MD  RSV vaccine recomb adjuvanted (AREXVY) 120 MCG/0.5ML injection Inject into the muscle. 07/05/22     terbinafine (LAMISIL) 250 MG tablet Take 1 tablet (250 mg total) by mouth daily. 07/04/22   [name], MD  Tiotropium Bromide Monohydrate (SPIRIVA RESPIMAT) 2.5 MCG/ACT AERS INHALE 2 PUFFS EVERY DAY Patient taking differently: Take 2 each by mouth daily. 04/05/22   Mannam, Hart Robinsons, MD  Vitamin D, Ergocalciferol, (DRISDOL) 1.25 MG (50000 UNIT) CAPS capsule Take 1 capsule (50,000 Units total) by mouth every 7 (seven) days. Patient taking differently: Take 50,000 Units by mouth every 7 (seven) days. Sunday 08/10/22 11/08/22  [name], MD      Allergies    Hydrocodone and Propoxyphene n-acetaminophen    Review of Systems   Review of Systems  Respiratory:  Positive for shortness of breath.     Physical Exam Updated Vital Signs BP 131/65   Pulse (!) 118   Temp 98.3 F (36.8 C) (Axillary)   Resp 19   SpO2 96%  Physical Exam Vitals and nursing note reviewed.  Cardiovascular:     Rate and Rhythm: Normal rate and regular rhythm.  Pulmonary:     Comments: Decreased air movement no focal rales or rhonchi. Abdominal:     Tenderness: There is no abdominal tenderness.  Musculoskeletal:     Left lower leg: No edema.  Skin:    Capillary Refill: Capillary refill takes less than 2 seconds.  Neurological:     Mental Status: She is alert and oriented to person, place, and time.     ED Results / Procedures / Treatments   Labs (all labs ordered are listed, but only abnormal results are displayed) Labs Reviewed  BASIC METABOLIC PANEL - Abnormal; Notable for the following components:      Result Value   Glucose, Bld 109 (*)    Creatinine, Ser 0.34 (*)    All other components within normal limits  CBC WITH DIFFERENTIAL/PLATELET - Abnormal; Notable for the following components:   WBC 11.0  (*)    RBC 3.85 (*)    MCV 100.5 (*)    Monocytes Absolute 1.3 (*)    All other components within normal limits  RESP PANEL BY RT-PCR (RSV, FLU A&B, COVID)  [id number]    EKG EKG Interpretation  Date/Time:  Friday September 14 2022 16:59:42 EST Ventricular Rate:  142 PR Interval:  136 QRS Duration: 99 QT Interval:  290 QTC Calculation: 446 R Axis:   -46 Text Interpretation: Sinus tachycardia LAD, consider left anterior fascicular block Anterior infarct, old Confirmed by Davonna Belling [id number]) on 09/14/2022 6:44:04 PM  Radiology DG Chest 2 View  Result Date: 09/14/2022 CLINICAL DATA:  Shortness of breath. Recently hospitalized for COPD exacerbation. EXAM: CHEST - 2  VIEW COMPARISON:  AP chest 08/17/2022, CT chest 07/27/2022 FINDINGS: Cardiac silhouette and mediastinal contours are within normal limits. Moderate to high-grade atherosclerotic calcifications within the aorta. Flattening of the diaphragms and moderate hyperinflation. 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 No focal airspace opacity. No pulmonary edema, pleural effusion, or pneumothorax. Mild-to-moderate multilevel degenerative disc changes of the thoracic spine. IMPRESSION: 1. No acute cardiopulmonary process. 2. Moderate hyperinflation and findings consistent with COPD. Electronically Signed   By: Yvonne Kendall M.D.   On: 09/14/2022 18:06    Procedures Procedures    Medications Ordered in ED Medications  albuterol (PROVENTIL) (2.5 MG/3ML) 0.083% nebulizer solution 5 mg (5 mg Nebulization Patient Refused/Not Given 09/14/22 1817)    ED Course/ Medical Decision Making/ A&P                           Medical Decision Making Amount and/or Complexity of Data Reviewed Labs: ordered. Radiology: ordered.  Risk Prescription drug management. Decision regarding hospitalization.   Patient is  shortness of breath.  History of COPD.  Tachypneic.  Reportedly sats go down in the 80s at home with exertion.  Most likely COPD exacerbation.  Pulm embolism felt less likely.  Pneumonia felt less likely.  Will get x-ray and blood work and continue treatment  Lab work reassuring.  Chest x-ray reassuring.  Still some tachycardia.  Still dyspneic.  Think patient benefit from longer observation admission to the hospital.  Has not really had worsening sputum production.  Will not give antibiotics at this time.  Will discuss with hospitalist.  Pulmonary embolism felt less likely.        Final Clinical Impression(s) / ED Diagnoses Final diagnoses:  COPD exacerbation [facility])    Rx / DC Orders ED Discharge Orders     None         Davonna Belling, MD 09/14/22 [phone]

## 2022-09-14 NOTE — Assessment & Plan Note (Signed)
Stable on home 3 L O2 via .

## 2022-09-14 NOTE — ED Triage Notes (Signed)
BIB EMS from home for [facility], recently hospitalized for COPD exacerbation. O2 at 3 lpm at all times. Duo neb given 125 solu medrol given 140 HR 160/72 BP 20 RR

## 2022-09-14 NOTE — Assessment & Plan Note (Signed)
Some improvement with DuoNeb but still having significant exertional dyspnea as well as some dyspnea at rest.  SpO2 stable on home 3 L O2 via [place].  Breath sounds are distant.  On chronic prednisone 10 mg as an outpatient. -IV Solu-Medrol 40 mg twice daily -Scheduled DuoNebs with Xopenex as needed -Continue Breo and Spiriva -Continue home azithromycin to 50 mg daily -Continue home 3 L supplemental O2 via [place] to maintain SpO2 88-92%

## 2022-09-14 NOTE — Hospital Course (Signed)
Margaret Henry is a 71 y.o. female with medical history significant for COPD (on chronic prednisone azithromycin), chronic respiratory failure with hypoxia on 3 L O2 via [place], HLD, GERD, chronic back pain with L1/L3 vertebral fractures who is admitted with COPD exacerbation.  12/10: Improved but still significant fatigue with exertion.  She reports significant low back pain so we will increase her pain medication.

## 2022-09-14 NOTE — ED Notes (Signed)
Patient transported to x-ray. ?

## 2022-09-14 NOTE — Assessment & Plan Note (Signed)
Continue atorvastatin

## 2022-09-14 NOTE — H&P (Signed)
History and Physical    Margaret Henry:[id number] DOB: [birth year] DOA: 09/14/2022  PCP: [name], MD  Patient coming from: Home  I have personally briefly reviewed patient's old medical records in [place]  Chief Complaint: Shortness of breath  HPI: Margaret Henry is a 71 y.o. female with medical history significant for COPD (on chronic prednisone azithromycin), chronic respiratory failure with hypoxia on 3 L O2 via [place], HLD, GERD, chronic back pain with L1/L3 vertebral fractures who presented to the ED for evaluation of dyspnea.  Patient states she has been dealing with chronic back pain associated with lumbar vertebral compression fractures.  She saw her orthopedic specialist earlier today and was told that her fractures were stable.  She says she was given an injection for her back pain.  Later today she developed shortness of breath with palpitations.  She has been having dyspnea with exertion, worsening from baseline.  She has a chronic productive cough which she says is unchanged from her baseline.  No fevers, chills, diaphoresis.  No chest pain.  No peripheral edema.  She called EMS.  She was given a DuoNeb treatment and 125 mg Solu-Medrol en route to the ED per ED triage documentation.  ED Course  Labs/Imaging on admission: I have personally reviewed following labs and imaging studies.  Initial vitals showed BP 209/90, pulse 147, RR 18, temp 98.3 F, SpO2 99% on 3 L O2 via .  Repeat BP 126/63.  Labs show sodium 143, potassium 3.8, bicarb 26, BUN 10, creatinine 0.34, serum glucose 109, WBC 11.0, hemoglobin 12.2, platelets 277,000.  COVID and influenza PCR negative.  2 view chest x-ray shows moderate hyperinflation of the lung fields consistent with COPD.  No acute cardiopulmonary process.  Patient was given continuous albuterol nebulizer and the hospitalist service was consulted to admit for further evaluation and management.  Review of Systems: All systems  reviewed and are negative except as documented in history of present illness above.   Past Medical History:  Diagnosis Date   Allergy    hayfever   Anemia 04/06/22   Blood transfusion without reported diagnosis 6/23   Bronchitis    Cancer ([place])    skin cancer on chest   Cataract 5/23   Chest pain 06/12/2016   Colon polyps    Complication of anesthesia    pt states was given too much during nasal surgery 1989; difficulty getting awake   Complication of anesthesia 05/16/2021   pt states was given too much during nasal surgery 1989; difficulty getting awake   COPD (chronic obstructive pulmonary disease) ([place])    COPD exacerbation ([place]) 06/01/2013   Coronary artery calcification    Diverticulitis    Diverticulitis 05/16/2021   occasional   Dyspnea 06/27/2016   Emphysema of lung (HCC)    GERD (gastroesophageal reflux disease)    occasional   Hemorrhoids    High cholesterol    Hyperlipidemia LDL goal <70    Mild aortic insufficiency    Osteoporosis    Oxygen deficiency    Pneumonia    Shortness of breath dyspnea    with exertion    Thyroid goiter    bx benign   Tobacco abuse    Vertigo    Vertigo 04/21/2017    Past Surgical History:  Procedure Laterality Date   ABDOMINAL HYSTERECTOMY     APPENDECTOMY  1973   BILATERAL OOPHORECTOMY  10/09/1999   for benign ovarian mass    BIOPSY THYROID  DENTAL SURGERY     dentures   NASAL SEPTUM SURGERY     PARTIAL HYSTERECTOMY  10/08/1974   for heavy menses    RECTAL EXAM UNDER ANESTHESIA N/A 12/14/2015   Procedure: RECTAL EXAM UNDER ANESTHESIA REMOVAL OF ANAL CANAL MASS; INTERNAL HEMORRHOID LIGATION, EXTERNAL HEMORRHOID LIGATION;  Surgeon: Michael Boston, MD;  Location: [facility];  Service: General;  Laterality: N/A;   TUBAL LIGATION     WISDOM TOOTH EXTRACTION      Social History:  reports that she quit smoking about 3 years ago. Her smoking use included cigarettes. She has a 75.00 pack-year smoking history. She has never  used smokeless tobacco. She reports that she does not drink alcohol and does not use drugs.  Allergies  Allergen Reactions   Hydrocodone Nausea And Vomiting   Propoxyphene N-Acetaminophen Nausea And Vomiting    Family History  Problem Relation Age of Onset   Heart disease Mother    Alcohol abuse Mother    Asthma Mother    COPD Father    Heart disease Father    Lung cancer Maternal Grandfather    Cancer Maternal Grandfather    Liver cancer Paternal Grandmother    Cancer Paternal Grandmother    Diabetes Paternal Aunt    Colon cancer Neg Hx    Colon polyps Neg Hx    Esophageal cancer Neg Hx    Rectal cancer Neg Hx    Stomach cancer Neg Hx      Prior to Admission medications   Medication Sig Start Date End Date Taking? Authorizing Provider  acetaminophen (TYLENOL) 500 MG tablet Take 1 tablet (500 mg total) by mouth every 4 (four) hours as needed for headache. Patient taking differently: Take 1,500 mg by mouth at bedtime. 08/20/22  Yes Mariel Aloe, MD  alendronate (FOSAMAX) 70 MG tablet Take 1 tablet (70 mg total) by mouth every 7 (seven) days. Take with a full glass of water on an empty stomach. Patient taking differently: Take 70 mg by mouth every Sunday. Take with a full glass of water on an empty stomach. 07/09/22  Yes Newlin, Charlane Ferretti, MD  atorvastatin (LIPITOR) 40 MG tablet TAKE 1 TABLET EVERY DAY Patient taking differently: Take 40 mg by mouth daily. 12/15/21  Yes Park Liter, MD  azithromycin (ZITHROMAX) 250 MG tablet Take 1 tablet (250 mg total) by mouth daily. 09/14/22  Yes Mannam, Praveen, MD  benzonatate (TESSALON) 200 MG capsule Take 1 capsule (200 mg total) by mouth 3 (three) times daily as needed for cough. 05/31/22  Yes Cobb, [name], NP  bismuth subsalicylate (PEPTO BISMOL) 262 MG/15ML suspension Take 30 mLs by mouth every 6 (six) hours as needed for indigestion.   Yes [provider]  BREO ELLIPTA 200-25 MCG/ACT AEPB Inhale 1 puff into the lungs  daily. 04/05/22  Yes [provider]  Cholecalciferol (VITAMIN D3) 25 MCG (1000 UT) CAPS Take 1,000 Units by mouth daily.   Yes [provider]  cyanocobalamin (VITAMIN B12) 1000 MCG tablet Take 1 tablet (1,000 mcg total) by mouth daily. 04/12/22 10/29/22 Yes [place] Eric J, DO  ferrous sulfate 325 (65 FE) MG tablet Take 1 tablet (325 mg total) by mouth 2 (two) times daily with a meal. Patient taking differently: Take 325 mg by mouth daily with breakfast. 04/12/22 09/14/22 Yes [place] Eric J, DO  fluticasone (FLONASE) 50 MCG/ACT nasal spray USE 2 SPRAYS IN EACH NOSTRIL EVERY DAY Patient taking differently: Place 2 sprays into both nostrils daily as  needed for allergies. 02/11/20  Yes Newlin, Enobong, MD  ipratropium-albuterol (DUONEB) 0.5-2.5 (3) MG/3ML SOLN INHALE THE CONTENTS OF 1 VIAL VIA NEBULIZER EVERY 6 HOURS AS NEEDED Patient taking differently: Take 3 mLs by nebulization 3 (three) times daily. 06/19/22  Yes [name], MD  loratadine (CLARITIN) 10 MG tablet Take 1 tablet (10 mg total) by mouth daily. 04/17/22  Yes [name], MD  meloxicam (MOBIC) 7.5 MG tablet Take 1 tablet (7.5 mg total) by mouth daily. Patient taking differently: Take 7.5 mg by mouth daily as needed for pain. 09/13/22  Yes [name], MD  mupirocin ointment (BACTROBAN) 2 % Apply 1 Application topically 2 (two) times daily. To affected nostril Patient taking differently: Place 1 Application into the nose See admin instructions. Apply to affected (inner) nostril(s) one to two times a day 05/31/22  Yes Cobb, [name], NP  OXYGEN Inhale 3 L/min into the lungs continuous.   Yes [provider]  predniSONE (DELTASONE) 10 MG tablet Take 3 tablets (30 mg total) by mouth daily with breakfast for 3 days, THEN 2 tablets (20 mg total) daily with breakfast for 3 days, THEN 1 tablet (10 mg total) daily with breakfast. Patient taking differently: Take 10 mg by mouth in the morning with breakfast 08/20/22  09/25/22 Yes Mariel Aloe, MD  terbinafine (LAMISIL) 250 MG tablet Take 1 tablet (250 mg total) by mouth daily. 07/04/22  Yes [name], MD  Tiotropium Bromide Monohydrate (SPIRIVA RESPIMAT) 2.5 MCG/ACT AERS INHALE 2 PUFFS EVERY DAY Patient taking differently: Take 2 each by mouth daily. 04/05/22  Yes Mannam, Praveen, MD  traMADol (ULTRAM) 50 MG tablet Take 50 mg by mouth every 6 (six) hours as needed (for pain).   Yes [provider]  Vitamin D, Ergocalciferol, (DRISDOL) 1.25 MG (50000 UNIT) CAPS capsule Take 1 capsule (50,000 Units total) by mouth every 7 (seven) days. Patient taking differently: Take 50,000 Units by mouth every Sunday. 08/10/22 11/08/22 Yes [name], MD  aspirin EC (ASPIRIN ADULT LOW STRENGTH) 81 MG tablet Take 1 tablet (81 mg total) by mouth at bedtime. Patient not taking: Reported on 09/14/2022 04/26/22   [place] Donnamarie Poag, DO  gabapentin (NEURONTIN) 100 MG capsule Take 1 capsule (100 mg total) by mouth 3 (three) times daily. Patient not taking: Reported on 09/14/2022 08/15/22 09/15/22  Callie Fielding, MD  RSV vaccine recomb adjuvanted (AREXVY) 120 MCG/0.5ML injection Inject into the muscle. Patient not taking: Reported on 09/14/2022 07/05/22       Physical Exam: Vitals:   09/14/22 1830 09/14/22 1900 09/14/22 1933 09/14/22 2127  BP: 126/63 131/65 (!) 111/58   Pulse: (!) 122 (!) 118 (!) 114   Resp: (!) 25 19 (!) 21   Temp:    97.7 F (36.5 C)  TempSrc:    Oral  SpO2: 96% 96% 96%    Constitutional: Resting in bed, no acute distress Eyes: EOMI, lids and conjunctivae normal ENMT: Mucous membranes are moist. Posterior pharynx clear of any exudate or lesions.Normal dentition.  Neck: normal, supple, no masses. Respiratory: Distant breath sounds. Normal respiratory effort while on 3 L O2 via [place]. No accessory muscle use.  Cardiovascular: Regular rate and rhythm, no murmurs / rubs / gallops. No extremity edema. 2+ pedal pulses. Abdomen: no tenderness, no  masses palpated.  Musculoskeletal: no clubbing / cyanosis. No joint deformity upper and lower extremities. Good ROM, no contractures. Normal muscle tone.  Skin: no rashes, lesions, ulcers. No induration Neurologic: Sensation intact. Strength 5/5 in  all 4.  Psychiatric:  Alert and oriented x 3. Normal mood.   EKG: Personally reviewed. Sinus tach, rate 142.  Rate is faster when compared to prior.  Assessment/Plan Principal Problem:   COPD with acute exacerbation (HCC) Active Problems:   Hyperlipidemia   Chronic respiratory failure with hypoxia (HCC)   ILYNN STAUFFER is a 71 y.o. female with medical history significant for COPD (on chronic prednisone azithromycin), chronic respiratory failure with hypoxia on 3 L O2 via [place], HLD, GERD, chronic back pain with L1/L3 vertebral fractures who is admitted with COPD exacerbation.  Assessment and Plan: * COPD with acute exacerbation ([place]) Some improvement with DuoNeb but still having significant exertional dyspnea as well as some dyspnea at rest.  SpO2 stable on home 3 L O2 via [place].  Breath sounds are distant.  On chronic prednisone 10 mg as an outpatient. -IV Solu-Medrol 40 mg twice daily -Scheduled DuoNebs with Xopenex as needed -Continue Breo and Spiriva -Continue home azithromycin to 50 mg daily -Continue home 3 L supplemental O2 via [place] to maintain SpO2 88-92%  Chronic respiratory failure with hypoxia (HCC) Stable on home 3 L O2 via [place].  Hyperlipidemia Continue atorvastatin.  DVT prophylaxis: enoxaparin (LOVENOX) injection 40 mg Start: 09/14/22 2200 Code Status: Full code, confirmed with patient on admission Family Communication: Discussed with patient, she has discussed with family Disposition Plan: From home and likely discharge to home pending clinical progress Consults called: None Severity of Illness: The appropriate patient status for this patient is OBSERVATION. Observation status is judged to be reasonable and necessary in order  to provide the required intensity of service to ensure the patient's safety. The patient's presenting symptoms, physical exam findings, and initial radiographic and laboratory data in the context of their medical condition is felt to place them at decreased risk for further clinical deterioration. Furthermore, it is anticipated that the patient will be medically stable for discharge from the hospital within 2 midnights of admission.   Zada Finders MD [facility]  If 7PM-7AM, please contact night-coverage [email or web address]  09/14/2022, 10:13 PM

## 2022-09-15 ENCOUNTER — Other Ambulatory Visit: Payer: Self-pay

## 2022-09-15 ENCOUNTER — Encounter (HOSPITAL_COMMUNITY): Payer: Self-pay | Admitting: Internal Medicine

## 2022-09-15 DIAGNOSIS — J441 Chronic obstructive pulmonary disease with (acute) exacerbation: Secondary | ICD-10-CM | POA: Diagnosis not present

## 2022-09-15 LAB — BASIC METABOLIC PANEL
Anion gap: 8 (ref 5–15)
BUN: 13 mg/dL (ref 8–23)
CO2: 22 mmol/L (ref 22–32)
Calcium: 8.8 mg/dL — ABNORMAL LOW (ref 8.9–10.3)
Chloride: 112 mmol/L — ABNORMAL HIGH (ref 98–111)
Creatinine, Ser: 0.57 mg/dL (ref 0.44–1.00)
GFR, Estimated: >60 mL/min (ref >60)
Glucose, Bld: 152 mg/dL — ABNORMAL HIGH (ref 70–99)
Potassium: 3.7 mmol/L (ref 3.5–5.1)
Sodium: 142 mmol/L (ref 135–145)

## 2022-09-15 LAB — CBC
HCT: 33.8 % — ABNORMAL LOW (ref 36.0–46.0)
Hemoglobin: 10.7 g/dL — ABNORMAL LOW (ref 12.0–15.0)
MCH: 31.6 pg (ref 26.0–34.0)
MCHC: 31.7 g/dL (ref 30.0–36.0)
MCV: 99.7 fL (ref 80.0–100.0)
Platelets: 257 10*3/uL (ref 150–400)
RBC: 3.39 MIL/uL — ABNORMAL LOW (ref 3.87–5.11)
RDW: 13.1 % (ref 11.5–15.5)
WBC: 7.1 10*3/uL (ref 4.0–10.5)
nRBC: 0 % (ref 0.0–0.2)

## 2022-09-15 MED ORDER — ALUM & MAG HYDROXIDE-SIMETH 200-200-20 MG/5ML PO SUSP
30.0000 mL | ORAL | Status: DC | PRN
  Administered 2022-09-15: 30 mL via ORAL
  Filled 2022-09-15: qty 30

## 2022-09-15 MED ORDER — IPRATROPIUM-ALBUTEROL 0.5-2.5 (3) MG/3ML IN SOLN
3.0000 mL | Freq: Three times a day (TID) | RESPIRATORY_TRACT | Status: DC
  Administered 2022-09-15 – 2022-09-16 (×2): 3 mL via RESPIRATORY_TRACT
  Filled 2022-09-15 (×2): qty 3

## 2022-09-15 MED ORDER — PREDNISONE 20 MG PO TABS
40.0000 mg | ORAL_TABLET | Freq: Every day | ORAL | Status: DC
  Administered 2022-09-15 – 2022-09-20 (×6): 40 mg via ORAL
  Filled 2022-09-15 (×6): qty 2

## 2022-09-15 NOTE — Progress Notes (Signed)
Progress Note   Patient: Margaret Henry WUJ:[id number] DOB: 08-24-1951 DOA: 09/14/2022     0 DOS: the patient was seen and examined on 09/15/2022   Brief hospital course: LUCIANN GOSSETT is a 70 y.o. female with medical history significant for COPD (on chronic prednisone azithromycin), chronic respiratory failure with hypoxia on 3 L O2 via [place], HLD, GERD, chronic back pain with L1/L3 vertebral fractures who is admitted with COPD exacerbation.  Assessment and Plan: * COPD with acute exacerbation ([place]) Some improvement with DuoNeb but still having significant exertional dyspnea as well as some dyspnea at rest.  SpO2 stable on home 3 L O2 via [place].  Breath sounds are distant.  On chronic prednisone 10 mg as an outpatient. Given IV solumedrol but patient prefers PO.  -IV Solu-Medrol 40 mg switched to prednisone 40 mg once daily -Scheduled DuoNebs with Xopenex as needed -Continue Breo and Spiriva -Continue home azithromycin to 250 mg daily -Continue home 3 L supplemental O2 via [place] to maintain SpO2 88-92%  Chronic respiratory failure with hypoxia (HCC) Stable on home 3 L O2 via .  Hyperlipidemia Continue atorvastatin.  DVT prophylaxis: enoxaparin (LOVENOX) injection 40 mg  Code Status: Full code, confirmed with patient on admission Family Communication: Discussed with patient, she has discussed with family Disposition Plan: From home and likely discharge to home pending clinical progress Consults called: None Severity of Illness: The appropriate patient status for this patient is OBSERVATION. Observation status is judged to be reasonable and necessary in order to provide the required intensity of service to ensure the patient's safety. The patient's presenting symptoms, physical exam findings, and initial radiographic and laboratory data in the context of their medical condition is felt to place them at decreased risk for further clinical deterioration. Furthermore, it is anticipated that the  patient will be medically stable for discharge from the hospital within 2 midnights of admission.       Subjective:  She reports significant fatigue and exertional SOB. Denies any chest pain, chest pressure, LE edema, orthopnea. Reports back pain is causing her significant discomfort.   Physical Exam: Vitals:   09/15/22 0840 09/15/22 1000 09/15/22 1010 09/15/22 1332  BP: (!) 133/106 (!) 130/55  (!) 168/91  Pulse: (!) 123 (!) 109  (!) 118  Resp: '18 18  17  '$ Temp:   97.8 F (36.6 C) 97.9 F (36.6 C)  TempSrc:   Oral   SpO2: 94% 99%  98%  Weight:    51.7 kg  Height:    '5\' 4"'$  (1.626 m)   Physical Exam Vitals and nursing note reviewed.  Constitutional:      Appearance: Normal appearance. She is not ill-appearing.  HENT:     Head: Normocephalic and atraumatic.     Mouth/Throat:     Mouth: Mucous membranes are moist.  Cardiovascular:     Rate and Rhythm: Regular rhythm. Tachycardia present.  Pulmonary:     Breath sounds: Examination of the right-upper field reveals decreased breath sounds. Examination of the left-upper field reveals decreased breath sounds. Examination of the right-middle field reveals decreased breath sounds. Examination of the left-middle field reveals decreased breath sounds. Examination of the right-lower field reveals decreased breath sounds. Examination of the left-lower field reveals decreased breath sounds. Decreased breath sounds present. No wheezing, rhonchi or rales.  Abdominal:     General: Abdomen is flat. There is no distension.     Palpations: Abdomen is soft. There is no mass.     Tenderness: There  is no abdominal tenderness.  Musculoskeletal:     Right lower leg: No edema.     Left lower leg: No edema.  Skin:    General: Skin is warm and dry.     Capillary Refill: Capillary refill takes less than 2 seconds.  Neurological:     Mental Status: She is alert.  Psychiatric:        Mood and Affect: Mood normal.        Behavior: Behavior normal.      Data Reviewed:     Latest Ref Rng & Units 09/15/2022    3:49 AM 09/14/2022    5:07 PM 08/18/2022    4:17 AM  CBC  WBC 4.0 - 10.5 K/uL 7.1  11.0  7.9   Hemoglobin 12.0 - 15.0 g/dL 10.7  12.2  10.2   Hematocrit 36.0 - 46.0 % 33.8  38.7  32.5   Platelets 150 - 400 K/uL 257  277  284       Latest Ref Rng & Units 09/15/2022    3:49 AM 09/14/2022    5:07 PM 08/27/2022    1:58 PM  BMP  Glucose 70 - 99 mg/dL 152  109    BUN 8 - 23 mg/dL 13  10    Creatinine 0.44 - 1.00 mg/dL 0.57  0.34    Sodium 135 - 145 mmol/L 142  143    Potassium 3.5 - 5.1 mmol/L 3.7  3.8    Chloride 98 - 111 mmol/L 112  108    CO2 22 - 32 mmol/L 22  26    Calcium 8.9 - 10.3 mg/dL 8.8  8.9  9.1      Family Communication: d/w patient  Disposition: Status is: Observation The patient remains OBS appropriate and will d/c before 2 midnights.  Planned Discharge Destination: Home    Time spent: 35 minutes  Author: Lorelei Pont, MD 09/15/2022 3:21 PM  For on call review [email or web address].

## 2022-09-15 NOTE — Progress Notes (Addendum)
Orthopedic Spine Surgery Office Note   Assessment: Patient is a 71 y.o. female with low back pain who I had initially seen for a L1 compression fracture.  During her follow up with me she developed an L3 compression fracture     Plan: -She got good relief with toradol shot at a previous visit and was interested in another -Continue with current medications for pain control -Patient's pain is getting better so will hold off on MRI, but will get it if her pain worsens or persists -Patient has gotten relief with PT in the past and was interested. She cannot mobilize well especially with her home O2 and lack of help at home to get to outpatient PT. She has had home health PT and was interested in doing that again. A referral was provided to her today -Patient should return to office in 6 weeks, repeat x-rays of lumbar spine at next visit: AP/lateral lumbar films   Injection note: After discussing the risks, benefits, and alternatives of a intramuscular Toradol injection, patient elected to proceed with the injection.  The left gluteus muscle was sterilized with an alcohol based prep.  The area was anesthetized with ethyl chloride.  A 60 mg intramuscular injection of Toradol was performed of the left gluteus muscle under standard sterile technique.  Needle was withdrawn and a Band-Aid was applied over the area.  Patient tolerated the injection well.     Patient expressed understanding of the plan and all questions were answered to the patient's satisfaction.    ___________________________________________________________________________   History: Patient is a 71 y.o. female who has been previously seen in the office for symptoms consistent with L1 compression fracture.  Her pain has been getting better with time. She has been able to do a little more around the house. The last two days have been rough for her though. She did not do anything but the pain got worse. Overall, her trajectory has been  improving pain though since our last visit.  She has a long history of urinary incontinence with no recent changes.  No bowel incontinence.  No new weakness.   Previous treatments: Tramadol, Tylenol, activity modification, topical medication, toradol injection    COPY OF FIRST NOTE Patient is a 71 y.o. female who presents today for lumbar spine.  Patient states that she was sitting on the toilet about 3 weeks ago and got up and noted acute onset of low back pain.  She states that she may have twisted wrong.  She feels the pain in her lower back and in the paraspinal muscles immediately adjacent to it.  She has no pain that radiates down the leg.  She has been having difficulty ambulating or doing any activity due to the pain.  She has a long history of urinary incontinence with no recent changes.  No bowel incontinence.  She gets some relief with laying down and sitting down.  Has a long history of smoking and a nodule that her PCP has been monitoring.  Has a history of skin cancer.     Weakness: denies Symptoms of imbalance: denies Paresthesias and numbness: sometimes in her feet but improves when she stands up or gets off of her buttocks Bowel or bladder incontinence: has long history of urinary incontinence that is unchanged, no bowel incontinence Saddle anesthesia: denies END OF COPY     Physical Exam:   General: no acute distress, appears stated age Neurologic: alert, answering questions appropriately, following commands Respiratory: unlabored breathing on room  air, symmetric chest rise Psychiatric: appropriate affect, normal cadence to speech     MSK (spine):   -Strength exam                                                   Left                  Right EHL                              5/5                  5/5 TA                                 5/5                  5/5 GSC                             5/5                  5/5 Knee extension            5/5                  5/5 Hip  flexion                    5/5                  5/5   -Sensory exam                           Sensation intact to light touch in L3-S1 nerve distributions of bilateral lower extremities   -Straight leg raise: Negative -Contralateral straight leg raise: Negative -Clonus: no beats bilaterally   Imaging: XR of the lumbar spine from 09/14/2022 was independently reviewed and interpreted, showing L1 and L3 compression fractures. The alignment and amount of collapse appears similar to prior films on 08/18/2022.     Patient name: Margaret Henry Patient MRN: [id number] Date of visit: 09/14/2022

## 2022-09-16 ENCOUNTER — Inpatient Hospital Stay (HOSPITAL_COMMUNITY): Payer: Medicare HMO

## 2022-09-16 DIAGNOSIS — Z7952 Long term (current) use of systemic steroids: Secondary | ICD-10-CM | POA: Diagnosis not present

## 2022-09-16 DIAGNOSIS — R0603 Acute respiratory distress: Secondary | ICD-10-CM | POA: Diagnosis not present

## 2022-09-16 DIAGNOSIS — R0602 Shortness of breath: Secondary | ICD-10-CM | POA: Diagnosis present

## 2022-09-16 DIAGNOSIS — J9621 Acute and chronic respiratory failure with hypoxia: Secondary | ICD-10-CM | POA: Diagnosis present

## 2022-09-16 DIAGNOSIS — Z9851 Tubal ligation status: Secondary | ICD-10-CM | POA: Diagnosis not present

## 2022-09-16 DIAGNOSIS — Z90722 Acquired absence of ovaries, bilateral: Secondary | ICD-10-CM | POA: Diagnosis not present

## 2022-09-16 DIAGNOSIS — Z86010 Personal history of colonic polyps: Secondary | ICD-10-CM | POA: Diagnosis not present

## 2022-09-16 DIAGNOSIS — Z20822 Contact with and (suspected) exposure to covid-19: Secondary | ICD-10-CM | POA: Diagnosis present

## 2022-09-16 DIAGNOSIS — G8929 Other chronic pain: Secondary | ICD-10-CM | POA: Diagnosis present

## 2022-09-16 DIAGNOSIS — M810 Age-related osteoporosis without current pathological fracture: Secondary | ICD-10-CM | POA: Diagnosis present

## 2022-09-16 DIAGNOSIS — M4856XA Collapsed vertebra, not elsewhere classified, lumbar region, initial encounter for fracture: Secondary | ICD-10-CM | POA: Diagnosis present

## 2022-09-16 DIAGNOSIS — Z85828 Personal history of other malignant neoplasm of skin: Secondary | ICD-10-CM | POA: Diagnosis not present

## 2022-09-16 DIAGNOSIS — Z8701 Personal history of pneumonia (recurrent): Secondary | ICD-10-CM | POA: Diagnosis not present

## 2022-09-16 DIAGNOSIS — J439 Emphysema, unspecified: Secondary | ICD-10-CM | POA: Diagnosis present

## 2022-09-16 DIAGNOSIS — Z79899 Other long term (current) drug therapy: Secondary | ICD-10-CM | POA: Diagnosis not present

## 2022-09-16 DIAGNOSIS — K219 Gastro-esophageal reflux disease without esophagitis: Secondary | ICD-10-CM | POA: Diagnosis present

## 2022-09-16 DIAGNOSIS — E7800 Pure hypercholesterolemia, unspecified: Secondary | ICD-10-CM | POA: Diagnosis present

## 2022-09-16 DIAGNOSIS — Z825 Family history of asthma and other chronic lower respiratory diseases: Secondary | ICD-10-CM | POA: Diagnosis not present

## 2022-09-16 DIAGNOSIS — Z90711 Acquired absence of uterus with remaining cervical stump: Secondary | ICD-10-CM | POA: Diagnosis not present

## 2022-09-16 DIAGNOSIS — Z87891 Personal history of nicotine dependence: Secondary | ICD-10-CM | POA: Diagnosis not present

## 2022-09-16 DIAGNOSIS — R32 Unspecified urinary incontinence: Secondary | ICD-10-CM | POA: Diagnosis present

## 2022-09-16 DIAGNOSIS — Z7951 Long term (current) use of inhaled steroids: Secondary | ICD-10-CM | POA: Diagnosis not present

## 2022-09-16 DIAGNOSIS — J9611 Chronic respiratory failure with hypoxia: Secondary | ICD-10-CM | POA: Diagnosis not present

## 2022-09-16 DIAGNOSIS — Z885 Allergy status to narcotic agent status: Secondary | ICD-10-CM | POA: Diagnosis not present

## 2022-09-16 DIAGNOSIS — Z9981 Dependence on supplemental oxygen: Secondary | ICD-10-CM | POA: Diagnosis not present

## 2022-09-16 DIAGNOSIS — E785 Hyperlipidemia, unspecified: Secondary | ICD-10-CM | POA: Diagnosis not present

## 2022-09-16 DIAGNOSIS — R000 Tachycardia, unspecified: Secondary | ICD-10-CM | POA: Diagnosis present

## 2022-09-16 DIAGNOSIS — J441 Chronic obstructive pulmonary disease with (acute) exacerbation: Secondary | ICD-10-CM | POA: Diagnosis not present

## 2022-09-16 LAB — ECHOCARDIOGRAM COMPLETE
Area-P 1/2: 2.09 cm2
Calc EF: 66.2 %
Height: 64 in
S' Lateral: 2.6 cm
Single Plane A2C EF: 68.6 %
Single Plane A4C EF: 62.5 %
Weight: 1823.65 oz

## 2022-09-16 LAB — BASIC METABOLIC PANEL WITH GFR
Anion gap: 5 (ref 5–15)
BUN: 23 mg/dL (ref 8–23)
CO2: 27 mmol/L (ref 22–32)
Calcium: 9 mg/dL (ref 8.9–10.3)
Chloride: 110 mmol/L (ref 98–111)
Creatinine, Ser: 0.59 mg/dL (ref 0.44–1.00)
GFR, Estimated: >60 mL/min (ref >60)
Glucose, Bld: 97 mg/dL (ref 70–99)
Potassium: 3.7 mmol/L (ref 3.5–5.1)
Sodium: 142 mmol/L (ref 135–145)

## 2022-09-16 LAB — CBC WITH DIFFERENTIAL/PLATELET
Abs Immature Granulocytes: 0.06 10*3/uL (ref 0.00–0.07)
Basophils Absolute: 0 10*3/uL (ref 0.0–0.1)
Basophils Relative: 0 %
Eosinophils Absolute: 0 10*3/uL (ref 0.0–0.5)
Eosinophils Relative: 0 %
HCT: 30.7 % — ABNORMAL LOW (ref 36.0–46.0)
Hemoglobin: 9.8 g/dL — ABNORMAL LOW (ref 12.0–15.0)
Immature Granulocytes: 1 %
Lymphocytes Relative: 10 %
Lymphs Abs: 1.3 10*3/uL (ref 0.7–4.0)
MCH: 31.9 pg (ref 26.0–34.0)
MCHC: 31.9 g/dL (ref 30.0–36.0)
MCV: 100 fL (ref 80.0–100.0)
Monocytes Absolute: 1.4 10*3/uL — ABNORMAL HIGH (ref 0.1–1.0)
Monocytes Relative: 10 %
Neutro Abs: 10.5 10*3/uL — ABNORMAL HIGH (ref 1.7–7.7)
Neutrophils Relative %: 79 %
Platelets: 255 10*3/uL (ref 150–400)
RBC: 3.07 MIL/uL — ABNORMAL LOW (ref 3.87–5.11)
RDW: 13.5 % (ref 11.5–15.5)
WBC: 13.3 10*3/uL — ABNORMAL HIGH (ref 4.0–10.5)
nRBC: 0 % (ref 0.0–0.2)

## 2022-09-16 LAB — PROCALCITONIN: Procalcitonin: <0.1 ng/mL

## 2022-09-16 MED ORDER — KETOROLAC TROMETHAMINE 15 MG/ML IJ SOLN
15.0000 mg | Freq: Once | INTRAMUSCULAR | Status: DC
  Filled 2022-09-16: qty 1

## 2022-09-16 MED ORDER — IPRATROPIUM-ALBUTEROL 0.5-2.5 (3) MG/3ML IN SOLN
3.0000 mL | Freq: Three times a day (TID) | RESPIRATORY_TRACT | Status: DC
  Administered 2022-09-17 – 2022-09-21 (×13): 3 mL via RESPIRATORY_TRACT
  Filled 2022-09-16 (×14): qty 3

## 2022-09-16 MED ORDER — IPRATROPIUM-ALBUTEROL 0.5-2.5 (3) MG/3ML IN SOLN: 3.0000 mL | RESPIRATORY_TRACT | Status: DC | PRN

## 2022-09-16 MED ORDER — IBUPROFEN 800 MG PO TABS
800.0000 mg | ORAL_TABLET | Freq: Four times a day (QID) | ORAL | Status: DC | PRN
  Administered 2022-09-16: 800 mg via ORAL
  Filled 2022-09-16: qty 1

## 2022-09-16 MED ORDER — IPRATROPIUM-ALBUTEROL 0.5-2.5 (3) MG/3ML IN SOLN
3.0000 mL | Freq: Four times a day (QID) | RESPIRATORY_TRACT | Status: DC
  Administered 2022-09-16: 3 mL via RESPIRATORY_TRACT
  Filled 2022-09-16: qty 3

## 2022-09-16 MED ORDER — LIDOCAINE 5 % EX PTCH
1.0000 | MEDICATED_PATCH | CUTANEOUS | Status: DC
  Administered 2022-09-16 – 2022-09-21 (×6): 1 via TRANSDERMAL
  Filled 2022-09-16 (×7): qty 1

## 2022-09-16 NOTE — Progress Notes (Addendum)
Progress Note   Patient: Margaret Henry HYI:[id number] DOB: [birth year] DOA: 09/14/2022     0 DOS: the patient was seen and examined on 09/16/2022   Brief hospital course: Margaret Henry is a 71 y.o. female with medical history significant for COPD (on chronic prednisone azithromycin), chronic respiratory failure with hypoxia on 3 L O2 via [place], HLD, GERD, chronic back pain with L1/L3 vertebral fractures who is admitted with COPD exacerbation.  Assessment and Plan: COPD with acute exacerbation ([place]) Severe COPD, Gold Stage D Follows with pulm outpatient with many exacerbations in the past, on chronic pred and azithromycin. Admitted with COPD exacerbation that has improved slightly on steroid dose scheduled nebulizers.  She is on home oxygen 3 L.SpO2 stable on home 3 L O2 via [place].  Breath sounds are distant.  On chronic prednisone 10 mg as an outpatient, increased to 40 mg while inpatient. Given IV solumedrol but patient prefers PO.  Breathing is significant improved but gets winded and fatigued easily with walking. -Continue prednisone 40 mg once daily -Scheduled DuoNebs changed to as needed with Xopenex as needed -Continue Breo and Spiriva -Continue home azithromycin to 250 mg daily -Continue home 3 L supplemental O2 via [place] to maintain SpO2 88-92%  Chronic back pain L1/L3 vertebral fractures Acute on chronic low back pain that began 3 weeks ago from possibly twisting it wrong.  She saw orthopedic surgeons several days before admission who gave her a Toradol shot.  They are recommending possible MRI for further workup but patient is unable to lie flat for long periods of time due to low back pain.  Did not think her fractures were any worse than prior.  They gave her an IM injection of Toradol which patient did not think helped her. -Lidocaine patch - Toradol 15 mg IV -Tramadol as needed  Chronic respiratory failure with hypoxia (HCC) Stable on home 3 L O2 via [place].  Hyperlipidemia Continue  atorvastatin.  DVT prophylaxis: enoxaparin (LOVENOX) injection 40 mg  Code Status: Full code, confirmed with patient on admission Family Communication: Discussed with patient, she has discussed with family Disposition Plan: From home and likely discharge to home pending clinical progress Consults called: None Severity of Illness: The appropriate patient status for this patient is OBSERVATION. Observation status is judged to be reasonable and necessary in order to provide the required intensity of service to ensure the patient's safety. The patient's presenting symptoms, physical exam findings, and initial radiographic and laboratory data in the context of their medical condition is felt to place them at decreased risk for further clinical deterioration. Furthermore, it is anticipated that the patient will be medically stable for discharge from the hospital within 2 midnights of admission.       Subjective:  She reports that she is improved but still having significant fatigue with exertion.  Her shortness of breath at rest improved.  She also reports that her heart rate increases drastically when she goes to do little exertion.  She also reports significant back pain not any weakness in her legs, loss of or bowel control, numbness or tingling in her legs. She has had issues with bladder incontinence for sometime.   Physical Exam: Vitals:   09/15/22 2109 09/16/22 0137 09/16/22 0626 09/16/22 0803  BP: (!) 146/65 (!) 105/52 (!) 117/54   Pulse: (!) 110 (!) 110 97   Resp: '20 18 16   '$ Temp: 98.4 F (36.9 C) 98.1 F (36.7 C) 98.2 F (36.8 C)   TempSrc: Oral  Oral Oral   SpO2: 96% 94% 96% 96%  Weight:      Height:       Physical Exam Vitals and nursing note reviewed.  Constitutional:      Appearance: Normal appearance. She is not ill-appearing.  HENT:     Head: Normocephalic and atraumatic.     Mouth/Throat:     Mouth: Mucous membranes are moist.  Cardiovascular:     Rate and Rhythm:  Regular rhythm. Tachycardia present.  Pulmonary:     Breath sounds: Examination of the right-upper field reveals decreased breath sounds. Examination of the left-upper field reveals decreased breath sounds. Examination of the right-middle field reveals decreased breath sounds. Examination of the left-middle field reveals decreased breath sounds. Examination of the right-lower field reveals decreased breath sounds. Examination of the left-lower field reveals decreased breath sounds. Decreased breath sounds present. No wheezing, rhonchi or rales.  Abdominal:     General: Abdomen is flat. There is no distension.     Palpations: Abdomen is soft. There is no mass.     Tenderness: There is no abdominal tenderness.  Musculoskeletal:     Right lower leg: No edema.     Left lower leg: No edema.  Skin:    General: Skin is warm and dry.     Capillary Refill: Capillary refill takes less than 2 seconds.  Neurological:     Mental Status: She is alert.  Psychiatric:        Mood and Affect: Mood normal.        Behavior: Behavior normal.     Data Reviewed:     Latest Ref Rng & Units 09/16/2022    5:28 AM 09/15/2022    3:49 AM 09/14/2022    5:07 PM  CBC  WBC 4.0 - 10.5 K/uL 13.3  7.1  11.0   Hemoglobin 12.0 - 15.0 g/dL 9.8  10.7  12.2   Hematocrit 36.0 - 46.0 % 30.7  33.8  38.7   Platelets 150 - 400 K/uL 255  257  277       Latest Ref Rng & Units 09/16/2022    5:28 AM 09/15/2022    3:49 AM 09/14/2022    5:07 PM  BMP  Glucose 70 - 99 mg/dL 97  152  109   BUN 8 - 23 mg/dL '23  13  10   '$ Creatinine 0.44 - 1.00 mg/dL 0.59  0.57  0.34   Sodium 135 - 145 mmol/L 142  142  143   Potassium 3.5 - 5.1 mmol/L 3.7  3.7  3.8   Chloride 98 - 111 mmol/L 110  112  108   CO2 22 - 32 mmol/L '27  22  26   '$ Calcium 8.9 - 10.3 mg/dL 9.0  8.8  8.9      Family Communication: d/w patient  Disposition: Status is: Observation The patient remains OBS appropriate and will d/c before 2 midnights.  Planned Discharge  Destination: Home    Time spent: 35 minutes  Author: Lorelei Pont, MD 09/16/2022 1:12 PM  For on call review [email or web address].

## 2022-09-16 NOTE — Progress Notes (Signed)
  Echocardiogram 2D Echocardiogram has been performed.  Margaret Henry 09/16/2022, 2:29 PM

## 2022-09-17 ENCOUNTER — Other Ambulatory Visit (HOSPITAL_BASED_OUTPATIENT_CLINIC_OR_DEPARTMENT_OTHER): Payer: Self-pay

## 2022-09-17 DIAGNOSIS — J441 Chronic obstructive pulmonary disease with (acute) exacerbation: Secondary | ICD-10-CM | POA: Diagnosis not present

## 2022-09-17 LAB — CBC
HCT: 31.4 % — ABNORMAL LOW (ref 36.0–46.0)
Hemoglobin: 9.7 g/dL — ABNORMAL LOW (ref 12.0–15.0)
MCH: 31.6 pg (ref 26.0–34.0)
MCHC: 30.9 g/dL (ref 30.0–36.0)
MCV: 102.3 fL — ABNORMAL HIGH (ref 80.0–100.0)
Platelets: 217 10*3/uL (ref 150–400)
RBC: 3.07 MIL/uL — ABNORMAL LOW (ref 3.87–5.11)
RDW: 13.4 % (ref 11.5–15.5)
WBC: 8.6 10*3/uL (ref 4.0–10.5)
nRBC: 0 % (ref 0.0–0.2)

## 2022-09-17 LAB — BASIC METABOLIC PANEL
Anion gap: 5 (ref 5–15)
BUN: 21 mg/dL (ref 8–23)
CO2: 28 mmol/L (ref 22–32)
Calcium: 8.9 mg/dL (ref 8.9–10.3)
Chloride: 109 mmol/L (ref 98–111)
Creatinine, Ser: 0.56 mg/dL (ref 0.44–1.00)
GFR, Estimated: >60 mL/min (ref >60)
Glucose, Bld: 80 mg/dL (ref 70–99)
Potassium: 3.7 mmol/L (ref 3.5–5.1)
Sodium: 142 mmol/L (ref 135–145)

## 2022-09-17 LAB — FERRITIN: Ferritin: 44 ng/mL (ref 11–307)

## 2022-09-17 LAB — TSH: TSH: 0.627 u[IU]/mL (ref 0.350–4.500)

## 2022-09-17 LAB — IRON AND TIBC
Iron: 43 ug/dL (ref 28–170)
Saturation Ratios: 17 % (ref 10.4–31.8)
TIBC: 248 ug/dL — ABNORMAL LOW (ref 250–450)
UIBC: 205 ug/dL

## 2022-09-17 NOTE — Evaluation (Signed)
Occupational Therapy Evaluation Patient Details Name: Margaret Henry MRN: [id number] DOB: [birth year] Today's Date: 09/17/2022   History of Present Illness Patient is a 71 year old female who presented with dyspnea. patient was admitted with COPD exacerbation, acute on chronic low back pain with L1/L3 vertebral fractures. PMH: skin cancer, anemia, emphysema of lung, osteoporosis, oxygen deficiency.   Clinical Impression   Patient is a 71 year old female who was admitted for above. Patient was living at home alone with supplemental O2 with [facility] services prior level. Patient was min A for ADLs seated with O2 noted to drop to 85% on 3L/min with over 1 min to recover seated. Patients HR was noted to increase to 120s with activity as well. Nurse made aware.  Currently, patient is limited by increased back pain, decreased safety awareness, decreased functional activity tolerance, increased need for supplemental O2, and decreased knowledge of AE/AD impacting independence in ADLs. Patient would continue to benefit from skilled OT services at this time while admitted and after d/c to address noted deficits in order to improve overall safety and independence in ADLs.       Recommendations for follow up therapy are one component of a multi-disciplinary discharge planning process, led by the attending physician.  Recommendations may be updated based on patient status, additional functional criteria and insurance authorization.   Follow Up Recommendations  Home health OT     Assistance Recommended at Discharge Intermittent Supervision/Assistance  Patient can return home with the following A little help with walking and/or transfers;Assistance with cooking/housework;Direct supervision/assist for medications management;Assist for transportation;Help with stairs or ramp for entrance;Direct supervision/assist for financial management;A little help with bathing/dressing/bathroom    Functional Status Assessment   Patient has had a recent decline in their functional status and demonstrates the ability to make significant improvements in function in a reasonable and predictable amount of time.  Equipment Recommendations  Other (comment) Management consultant)    Recommendations for Other Services       Precautions / Restrictions Precautions Precautions: Fall Precaution Comments: monitor HR/SATS, on 3 LPM Restrictions Weight Bearing Restrictions: No      Mobility Bed Mobility Overal bed mobility: Needs Assistance Bed Mobility: Supine to Sit, Sit to Supine     Supine to sit: Modified independent (Device/Increase time) Sit to supine: Modified independent (Device/Increase time)        Transfers Overall transfer level: Needs assistance Equipment used: Rolling walker (2 wheels) Transfers: Sit to/from Stand, Bed to chair/wheelchair/BSC Sit to Stand: Min guard     Step pivot transfers: Min guard     General transfer comment: transfer to [facility] and to bed      Balance Overall balance assessment: Mild deficits observed, not formally tested                                         ADL either performed or assessed with clinical judgement   ADL Overall ADL's : Needs assistance/impaired Eating/Feeding: Modified independent;Sitting   Grooming: Set up;Sitting   Upper Body Bathing: Sitting;Set up   Lower Body Bathing: Sit to/from stand;Min guard   Upper Body Dressing : Set up;Sitting   Lower Body Dressing: Min guard;Sitting/lateral leans;Sit to/from stand Lower Body Dressing Details (indicate cue type and reason): able to don socks with figure four postiioning. Toilet Transfer: Min guard;Ambulation;Rolling walker (2 wheels) Toilet Transfer Details (indicate cue type and reason): patient was able  to stand pivot to 3in 1 commode with no AD with patient pushing walker away from her. patient used walker for mobility in hallway wiht noted stats dropping into 80s with 3L/min. Toileting-  Water quality scientist and Hygiene: Min guard;Sit to/from stand       Functional mobility during ADLs: Min guard;Rolling walker (2 wheels) General ADL Comments: patient was educated on proper body mechanics when supine in bed to promote best positioning to reduce back pain.     Vision Patient Visual Report: No change from baseline       Perception     Praxis      Pertinent Vitals/Pain Pain Assessment Pain Assessment: 0-10 Pain Score: 10-Worst pain ever Pain Location: back and  down left hip Pain Descriptors / Indicators: Cramping, Grimacing, Guarding Pain Intervention(s): Monitored during session, Premedicated before session, Repositioned     Hand Dominance Right   Extremity/Trunk Assessment Upper Extremity Assessment Upper Extremity Assessment: Overall WFL for tasks assessed   Lower Extremity Assessment Lower Extremity Assessment: Defer to PT evaluation   Cervical / Trunk Assessment Cervical / Trunk Assessment: Kyphotic   Communication Communication Communication: No difficulties   Cognition Arousal/Alertness: Awake/alert Behavior During Therapy: WFL for tasks assessed/performed Overall Cognitive Status: Within Functional Limits for tasks assessed                                       General Comments       Exercises     Shoulder Instructions      Home Living Family/patient expects to be discharged to:: Private residence Living Arrangements: Alone Available Help at Discharge: Available PRN/intermittently Type of Home: Mobile home Home Access: Stairs to enter Entrance Stairs-Number of Steps: 3 Entrance Stairs-Rails: Can reach both Home Layout: One level     Bathroom Shower/Tub: Teacher, early years/pre: Standard Bathroom Accessibility: Yes   Home Equipment: Conservation officer, nature (2 wheels);Shower seat   Additional Comments: O2, daughter works,  aid through [employer] says has had HHPT recently, ? still active      Prior  Functioning/Environment Prior Level of Function : Needs assist       Physical Assist : ADLs (physical)   ADLs (physical): Bathing;Dressing Mobility Comments: IND household amb, wears 3L O2 all the time ADLs Comments: Pt reports sister helps bathe her and asissts with dressing.        OT Problem List: Decreased activity tolerance;Decreased coordination;Impaired balance (sitting and/or standing);Decreased knowledge of use of DME or AE;Pain      OT Treatment/Interventions: Energy conservation;Self-care/ADL training;Therapeutic exercise;DME and/or AE instruction;Therapeutic activities;Patient/family education;Balance training    OT Goals(Current goals can be found in the care plan section) Acute Rehab OT Goals Patient Stated Goal: to get home OT Goal Formulation: With patient Time For Goal Achievement: 10/01/22 Potential to Achieve Goals: Fair  OT Frequency: Min 2X/week    Co-evaluation              AM-PAC OT "6 Clicks" Daily Activity     Outcome Measure Help from another person eating meals?: A Little Help from another person taking care of personal grooming?: A Little Help from another person toileting, which includes using toliet, bedpan, or urinal?: A Little Help from another person bathing (including washing, rinsing, drying)?: A Lot Help from another person to put on and taking off regular upper body clothing?: A Little Help from another person to put on and taking  off regular lower body clothing?: A Lot 6 Click Score: 16   End of Session Equipment Utilized During Treatment: Gait belt;Rolling walker (2 wheels) Nurse Communication: Mobility status  Activity Tolerance: Patient tolerated treatment well Patient left: in bed;with call bell/phone within reach  OT Visit Diagnosis: Unsteadiness on feet (R26.81);Other abnormalities of gait and mobility (R26.89)                Time: 6047-9987 OT Time Calculation (min): 24 min Charges:  OT General Charges $OT Visit: 1  Visit OT Evaluation $OT Eval Moderate Complexity: 1 Mod  Koltan Portocarrero OTR/L, MS Acute Rehabilitation Department Office# [phone]   Willa Rough 09/17/2022, 2:58 PM

## 2022-09-17 NOTE — Evaluation (Addendum)
Physical Therapy Evaluation Patient Details Name: Margaret Henry MRN: [id number] DOB: [birth year] Today's Date: 09/17/2022  History of Present Illness  Margaret Henry is a 71 y.o. female with medical history significant for COPD (on chronic prednisone azithromycin), chronic respiratory failure with hypoxia on 3 L O2 via [place], HLD, GERD, chronic back pain with L1/L3 vertebral fractures who is admitted 09/14/22 with COPD exacerbation.  Clinical Impression  Pt admitted with above diagnosis.  Pt currently with functional limitations due to the deficits listed below (see PT Problem List). Pt will benefit from skilled PT to increase their independence and safety with mobility to allow discharge to the venue listed below.     The patient is asking about  getting a back brace. Patient reports that she was seen by her orthopedist Friday but  did not ask. Will defer brace to MD discretion.  Patient did ambulate x 50' then 25' on 3 LPM. SPO2 95% resting, dropping to 82 % while ambulating. HR max to 124.  Patient reports alone  when daughter working, has [facility] services.    Recommendations for follow up therapy are one component of a multi-disciplinary discharge planning process, led by the attending physician.  Recommendations may be updated based on patient status, additional functional criteria and insurance authorization.  Follow Up Recommendations Home health PT      Assistance Recommended at Discharge Set up Supervision/Assistance  Patient can return home with the following  Assistance with cooking/housework;Help with stairs or ramp for entrance;A little help with bathing/dressing/bathroom    Equipment Recommendations None recommended by PT  Recommendations for Other Services       Functional Status Assessment Patient has had a recent decline in their functional status and demonstrates the ability to make significant improvements in function in a reasonable and predictable amount of time.      Precautions / Restrictions Precautions Precautions: Fall Precaution Comments: monitor HR/SATS, on 3 LPM      Mobility  Bed Mobility Overal bed mobility: Needs Assistance Bed Mobility: Supine to Sit, Sit to Supine     Supine to sit: Modified independent (Device/Increase time) Sit to supine: Modified independent (Device/Increase time)        Transfers Overall transfer level: Needs assistance Equipment used: Rolling walker (2 wheels) Transfers: Sit to/from Stand, Bed to chair/wheelchair/BSC Sit to Stand: Min guard   Step pivot transfers: Min guard       General transfer comment: transfer to [facility] and to bed    Ambulation/Gait Ambulation/Gait assistance: Min assist Gait Distance (Feet): 50 Feet (then 25') Assistive device: Rolling walker (2 wheels) Gait Pattern/deviations: Step-through pattern       General Gait Details: slow  speed, stopped x 1 , stand break, then reported legs were giving out so brought up a  chair, then ambulated a gain.  Stairs            Wheelchair Mobility    Modified Rankin (Stroke Patients Only)       Balance Overall balance assessment: Mild deficits observed, not formally tested                                           Pertinent Vitals/Pain Pain Assessment Pain Assessment: (P) 0-10 Pain Score: 10-Worst pain ever Pain Location: back and  down left hip Pain Descriptors / Indicators: Cramping, Grimacing, Guarding Pain Intervention(s): Monitored during session, Premedicated before session,  Repositioned    Home Living Family/patient expects to be discharged to:: Private residence Living Arrangements: Alone Available Help at Discharge: Available PRN/intermittently Type of Home: Mobile home Home Access: Stairs to enter Entrance Stairs-Rails: Can reach both Entrance Stairs-Number of Steps: 3   Home Layout: One level Home Equipment: Conservation officer, nature (2 wheels);Shower seat Additional Comments: O2, daughter  works,  aid through [employer] says has had HHPT recently, ? still active    Prior Function Prior Level of Function : Needs assist             Mobility Comments: IND household amb, wears 3L O2 all the time       Hand Dominance        Extremity/Trunk Assessment        Lower Extremity Assessment Lower Extremity Assessment: Generalized weakness    Cervical / Trunk Assessment Cervical / Trunk Assessment: Kyphotic  Communication      Cognition Arousal/Alertness: Awake/alert Behavior During Therapy: WFL for tasks assessed/performed Overall Cognitive Status: Within Functional Limits for tasks assessed                                          General Comments      Exercises     Assessment/Plan    PT Assessment Patient needs continued PT services  PT Problem List Decreased strength;Decreased mobility;Decreased activity tolerance;Cardiopulmonary status limiting activity       PT Treatment Interventions DME instruction;Functional mobility training;Gait training;Therapeutic activities;Therapeutic exercise;Patient/family education    PT Goals (Current goals can be found in the Care Plan section)  Acute Rehab PT Goals Patient Stated Goal: to breathe, go home PT Goal Formulation: With patient Time For Goal Achievement: 10/01/22 Potential to Achieve Goals: Fair    Frequency Min 3X/week     Co-evaluation               AM-PAC PT "6 Clicks" Mobility  Outcome Measure Help needed turning from your back to your side while in a flat bed without using bedrails?: None Help needed moving from lying on your back to sitting on the side of a flat bed without using bedrails?: None Help needed moving to and from a bed to a chair (including a wheelchair)?: A Little Help needed standing up from a chair using your arms (e.g., wheelchair or bedside chair)?: A Little Help needed to walk in hospital room?: A Little Help needed climbing 3-5 steps with a  railing? : A Lot 6 Click Score: 19    End of Session   Activity Tolerance: Patient limited by fatigue;Treatment limited secondary to medical complications (Comment) Patient left: in bed;with bed alarm set;with call bell/phone within reach Nurse Communication: Mobility status PT Visit Diagnosis: Unsteadiness on feet (R26.81);Difficulty in walking, not elsewhere classified (R26.2)    Time: 6195-0932 PT Time Calculation (min) (ACUTE ONLY): 22 min   Charges:   PT Evaluation $PT Eval Low Complexity: 1 Low          [place] Office [phone] Weekend [phone]   Claretha Cooper 09/17/2022, 12:40 PM

## 2022-09-17 NOTE — Progress Notes (Signed)
Mobility Specialist - Progress Note   09/17/22 0859  Oxygen Therapy  SpO2 90 %  O2 Device Nasal Cannula  O2 Flow Rate (L/min) 3 L/min  Mobility  Activity Ambulated with assistance in hallway  Level of Assistance Contact guard assist, steadying assist  Assistive Device Front wheel walker  Distance Ambulated (ft) 25 ft  Activity Response Tolerated well  Mobility Referral Yes  $Mobility charge 1 Mobility    Pt received in chair and agreed to mobility. Pt attempted to walk in hallway but back pain caused SOB. O2 also dropped to 89% on 3L. Returned to chair with all needs met and ice to alleviate pain.  Roderick Pee Mobility Specialist

## 2022-09-17 NOTE — Progress Notes (Signed)
PT Cancellation Note  Patient Details Name: GRACYN ALLOR MRN: [id number] DOB: 01/26/1951   Cancelled Treatment:    Reason Eval/Treat Not Completed: Medical issues which prohibited therapy;Pain limiting ability to participate Patient just ambulated with Mobility specialist and was limited and had to sit down after  25'. Pwer RN, HR  and sats dropped.  Will check back another time. [place] Office [phone] Weekend pager-[phone]   Claretha Cooper 09/17/2022, 9:42 AM

## 2022-09-17 NOTE — TOC Initial Note (Signed)
Transition of Care [facility]) - Initial/Assessment Note    Patient Details  Name: Margaret Henry MRN: [id number] Date of Birth: Jun 18, 1951  Transition of Care [facility]) CM/SW Contact:    Vassie Moselle, LCSW Phone Number: 09/17/2022, 11:44 AM  Clinical Narrative:                 Pt currently on 3L O2 at baseline. Pt is active with [facility] for home health PT/OT/RN. Pt has RW at home and denies having further DME needs. Pt will need ROC orders placed prior to discharge. TOC will continue to follow for discharge recommendations and needs.   Expected Discharge Plan: Home/Self Care Barriers to Discharge: Continued Medical Work up   Patient Goals and CMS Choice Patient states their goals for this hospitalization and ongoing recovery are:: Unable to assess at this time CMS Medicare.gov Compare Post Acute Care list provided to:: Patient Choice offered to / list presented to : Patient  Expected Discharge Plan and Services Expected Discharge Plan: Home/Self Care In-house Referral: NA Discharge Planning Services: NA   Living arrangements for the past 2 months: Single Family Home                 DME Arranged: N/A DME Agency: NA                  Prior Living Arrangements/Services Living arrangements for the past 2 months: Single Family Home Lives with:: Self   Do you feel safe going back to the place where you live?: Yes      Need for Family Participation in Patient Care: No (Comment) Care giver support system in place?: No (comment) Current home services: DME Criminal Activity/Legal Involvement Pertinent to Current Situation/Hospitalization: No - Comment as needed  Activities of Daily Living Home Assistive Devices/Equipment: Dentures (specify type), Oxygen, Shower chair with back, Grab bars in shower, Walker (specify type) ADL Screening (condition at time of admission) Patient's cognitive ability adequate to safely complete daily activities?: Yes Is the patient deaf or have  difficulty hearing?: No Does the patient have difficulty seeing, even when wearing glasses/contacts?: No Does the patient have difficulty concentrating, remembering, or making decisions?: No Patient able to express need for assistance with ADLs?: Yes Does the patient have difficulty dressing or bathing?: Yes Independently performs ADLs?: No Communication: Independent Dressing (OT): Needs assistance Is this a change from baseline?: Change from baseline, expected to last <3days Grooming: Needs assistance Is this a change from baseline?: Change from baseline, expected to last <3 days Feeding: Independent Bathing: Needs assistance Is this a change from baseline?: Change from baseline, expected to last <3 days In/Out Bed: Needs assistance Is this a change from baseline?: Change from baseline, expected to last <3 days Walks in Home: Independent Does the patient have difficulty walking or climbing stairs?: Yes Weakness of Legs: None Weakness of Arms/Hands: None  Permission Sought/Granted   Permission granted to share information with : No              Emotional Assessment       Orientation: : Oriented to Self, Oriented to Place, Oriented to  Time, Oriented to Situation Alcohol / Substance Use: Not Applicable Psych Involvement: No (comment)  Admission diagnosis:  COPD exacerbation ([place]) [J44.1] COPD with acute exacerbation ([place]) [J44.1] Patient Active Problem List   Diagnosis Date Noted   Incomplete bladder emptying 08/17/2022   COPD exacerbation ([place]) 08/17/2022   CAP (community acquired pneumonia) 06/12/2022   Shortness of breath 05/31/2022  Lung nodule 05/23/2022   Iron deficiency anemia 04/07/2022   Neuropathy 04/07/2022   B12 deficiency anemia 04/06/2022   Protein calorie malnutrition ([place]) 01/10/2022   Syncope and collapse 06/13/2021   COVID-19 virus infection 06/13/2021   Chronic respiratory failure with hypoxia ([place]) 06/13/2021   Mild aortic insufficiency  05/16/2021   Hemorrhoids 05/16/2021   Colon polyps 05/16/2021   Cancer ([place]) 05/16/2021   Bronchitis 05/16/2021   Allergy 05/16/2021   COPD with acute exacerbation ([place]) 10/03/2018   Overactive bladder 03/17/2018   Chronic respiratory failure ([place]) 10/04/2017   Seborrheic keratoses 06/12/2016   Coronary arteriosclerosis 09/26/2015   Unspecified vitamin D deficiency 03/02/2010   Allergic rhinitis 03/02/2010   BACK PAIN, LUMBAR 03/02/2010   TROCHANTERIC BURSITIS, BILATERAL 03/02/2010   THYROID NODULE, RIGHT 11/09/2009   Hyperlipidemia 02/25/2009   GERD (gastroesophageal reflux disease) 02/25/2009   Osteoporosis 01/30/2009   COMPRESSION FRACTURE, SPINE 01/21/2009   Goiter, unspecified 10/08/1997   PCP:  [name], MD Pharmacy:   [facility] [address] [place] [address] Phone: [phone] Fax: [phone]     Social Determinants of Health (SDOH) Interventions    Readmission Risk Interventions    09/17/2022   11:43 AM 08/20/2022    1:29 PM  Readmission Risk Prevention Plan  Post Dischage Appt  Complete  Medication Screening  Complete  Transportation Screening Complete Complete  PCP or Specialist Appt within 5-7 Days Complete   Home Care Screening Complete   Medication Review (RN CM) Complete

## 2022-09-17 NOTE — Progress Notes (Signed)
PROGRESS NOTE    [name]  KNL:[id number] DOB: Jul 28, 1951 DOA: 09/14/2022 PCP: [name], MD   Brief Narrative:  Margaret Henry is a 71 y.o. female with medical history significant for COPD (on chronic prednisone azithromycin), chronic respiratory failure with hypoxia on 3 L O2 via [place], HLD, GERD, chronic back pain with L1/L3 vertebral fractures who is admitted with COPD exacerbation.   Assessment & Plan:   Principal Problem:   COPD with acute exacerbation ([place]) Active Problems:   Hyperlipidemia   Chronic respiratory failure with hypoxia (HCC)   COPD exacerbation (HCC)   COPD with acute exacerbation (HCC) Severe COPD, Gold Stage D - Advanced disease following with pulmonology in the outpatient setting on chronic prednisone and azithromycin -Continue steroid taper, plan to taper back to patient's 10 mg daily prednisone over the next 10 days to 2 weeks - Baseline oxygen use 3 L nasal cannula around-the-clock  -Continues to have profound symptoms with exertion -Continue supportive care, nebs, home inhaled medications and chronic azithromycin -Wean oxygen as tolerated   Chronic back pain L1/L3 vertebral fractures Acute on chronic low back pain that began 3 weeks prior to admission from possibly twisting it wrong.   -Evaluated in orthopedic clinic Friday before admission having received Toradol shot with no improvement in symptoms.   -Plan for outpatient MRI per orthopedics as scheduled but patient currently unable to tolerate lying flat for long enough to obtain image currently. - Lidocaine patch - Tramadol as needed   Chronic respiratory failure with hypoxia (HCC) 3 L nasal cannula baseline oxygen at home around-the-clock   Hyperlipidemia Continue atorvastatin.   DVT prophylaxis: enoxaparin (LOVENOX) injection 40 mg  Code Status: Full code Family Communication: Discussed with patient, she prefers to discuss with her family  Status is: Inpatient  Dispo: The  patient is from: Home              Anticipated d/c is to: Home              Anticipated d/c date is: 24 to 48 hours              Patient currently not medically stable for discharge  Consultants:  None  Procedures:  None  Antimicrobials:  Chronic azithromycin  Subjective: No acute issues or events overnight respiratory status minimally improving but remains markedly dyspneic with exertion  Objective: Vitals:   09/16/22 0803 09/16/22 1439 09/16/22 1952 09/17/22 0453  BP:  124/60 131/70 106/80  Pulse:  (!) 102 91 86  Resp:  '14 16 15  '$ Temp:  98.5 F (36.9 C) 98.2 F (36.8 C) 98 F (36.7 C)  TempSrc:    Oral  SpO2: 96% 100% 100% 100%  Weight:      Height:        Intake/Output Summary (Last 24 hours) at 09/17/2022 0751 Last data filed at 09/16/2022 1019 Gross per 24 hour  Intake 220 ml  Output --  Net 220 ml   Filed Weights   09/15/22 1332  Weight: 51.7 kg    Examination:  General:  Pleasantly resting in bed, No acute distress. HEENT:  Normocephalic atraumatic.  Sclerae nonicteric, noninjected.  Extraocular movements intact bilaterally. Neck:  Without mass or deformity.  Trachea is midline. Lungs: Coarse breath sounds bilaterally without overt rales, end expiratory wheeze Heart:  Regular rate and rhythm.  Without murmurs, rubs, or gallops. Abdomen:  Soft, nontender, nondistended.  Without guarding or rebound. Extremities: Without cyanosis, clubbing, edema, or obvious deformity. Vascular:  Dorsalis pedis and posterior tibial pulses palpable bilaterally. Skin:  Warm and dry, no erythema, no ulcerations.  Data Reviewed: I have personally reviewed following labs and imaging studies  CBC: Recent Labs  Lab 09/14/22 1707 09/15/22 0349 09/16/22 0528 09/17/22 0518  WBC 11.0* 7.1 13.3* 8.6  NEUTROABS 7.5  --  10.5*  --   HGB 12.2 10.7* 9.8* 9.7*  HCT 38.7 33.8* 30.7* 31.4*  MCV 100.5* 99.7 100.0 102.3*  PLT 277 257 255 229   Basic Metabolic Panel: Recent  Labs  Lab 09/14/22 1707 09/15/22 0349 09/16/22 0528 09/17/22 0518  NA 143 142 142 142  K 3.8 3.7 3.7 3.7  CL 108 112* 110 109  CO2 '26 22 27 28  '$ GLUCOSE 109* 152* 97 80  BUN '10 13 23 21  '$ CREATININE 0.34* 0.57 0.59 0.56  CALCIUM 8.9 8.8* 9.0 8.9   Thyroid Function Tests: Recent Labs    09/17/22 0518  TSH 0.627   Anemia Panel: Recent Labs    09/17/22 0518  FERRITIN 44  TIBC 248*  IRON 43   Sepsis Labs: Recent Labs  Lab 09/16/22 0528  PROCALCITON <0.10    Recent Results (from the past 240 hour(s))  Resp panel by RT-PCR (RSV, Flu A&B, Covid) Anterior Nasal Swab     Status: None   Collection Time: 09/14/22  5:22 PM   Specimen: Anterior Nasal Swab  Result Value Ref Range Status   SARS Coronavirus 2 by RT PCR NEGATIVE NEGATIVE Final    Comment: (NOTE) SARS-CoV-2 target nucleic acids are NOT DETECTED.  The SARS-CoV-2 RNA is generally detectable in upper respiratory specimens during the acute phase of infection. The lowest concentration of SARS-CoV-2 viral copies this assay can detect is 138 copies/mL. A negative result does not preclude SARS-Cov-2 infection and should not be used as the sole basis for treatment or other patient management decisions. A negative result may occur with  improper specimen collection/handling, submission of specimen other than nasopharyngeal swab, presence of viral mutation(s) within the areas targeted by this assay, and inadequate number of viral copies(<138 copies/mL). A negative result must be combined with clinical observations, patient history, and epidemiological information. The expected result is Negative.  Fact Sheet for Patients:  EntrepreneurPulse.com.au  Fact Sheet for Healthcare Providers:  IncredibleEmployment.be  This test is no t yet approved or cleared by the [place] FDA and  has been authorized for detection and/or diagnosis of SARS-CoV-2 by FDA under an Emergency Use  Authorization (EUA). This EUA will remain  in effect (meaning this test can be used) for the duration of the COVID-19 declaration under Section 564(b)(1) of the Act, 21 U.S.C.section 360bbb-3(b)(1), unless the authorization is terminated  or revoked sooner.       Influenza A by PCR NEGATIVE NEGATIVE Final   Influenza B by PCR NEGATIVE NEGATIVE Final    Comment: (NOTE) The Xpert Xpress SARS-CoV-2/FLU/RSV plus assay is intended as an aid in the diagnosis of influenza from Nasopharyngeal swab specimens and should not be used as a sole basis for treatment. Nasal washings and aspirates are unacceptable for Xpert Xpress SARS-CoV-2/FLU/RSV testing.  Fact Sheet for Patients: EntrepreneurPulse.com.au  Fact Sheet for Healthcare Providers: IncredibleEmployment.be  This test is not yet approved or cleared by the [place] FDA and has been authorized for detection and/or diagnosis of SARS-CoV-2 by FDA under an Emergency Use Authorization (EUA). This EUA will remain in effect (meaning this test can be used) for the duration of the COVID-19 declaration under Section  564(b)(1) of the Act, 21 U.S.C. section 360bbb-3(b)(1), unless the authorization is terminated or revoked.     Resp Syncytial Virus by PCR NEGATIVE NEGATIVE Final    Comment: (NOTE) Fact Sheet for Patients: EntrepreneurPulse.com.au  Fact Sheet for Healthcare Providers: IncredibleEmployment.be  This test is not yet approved or cleared by the [place] FDA and has been authorized for detection and/or diagnosis of SARS-CoV-2 by FDA under an Emergency Use Authorization (EUA). This EUA will remain in effect (meaning this test can be used) for the duration of the COVID-19 declaration under Section 564(b)(1) of the Act, 21 U.S.C. section 360bbb-3(b)(1), unless the authorization is terminated or revoked.  Performed at [facility],  [place] [address], [place], [place] [address]          Radiology Studies: ECHOCARDIOGRAM COMPLETE  Result Date: 09/16/2022    ECHOCARDIOGRAM REPORT   Patient Name:   KANI JOBSON Date of Exam: 09/16/2022 Medical Rec #:  [id number]      Height:       64.0 in Accession #:    [id number]     Weight:       114.0 lb Date of Birth:  Feb 24, 1951      BSA:          1.541 m Patient Age:    69 years       BP:           117/54 mmHg Patient Gender: F              HR:           93 bpm. Exam Location:  Inpatient Procedure: 2D Echo, Cardiac Doppler and Color Doppler Indications:    Acute respiratory distress  History:        Patient has prior history of Echocardiogram examinations, most                 recent 06/14/2021. COPD, Signs/Symptoms:Dyspnea and Shortness of                 Breath; Risk Factors:Dyslipidemia and Former Smoker. Chronic                 respiratory failure, hx of cancer.  Sonographer:    Eartha Inch Referring Phys: [id number] Max  Sonographer Comments: Technically difficult study due to poor echo windows. Image acquisition challenging due to patient body habitus, Image acquisition challenging due to respiratory motion and Image acquisition challenging due to COPD. IMPRESSIONS  1. Left ventricular ejection fraction, by estimation, is 60 to 65%. The left ventricle has normal function. The left ventricle has no regional wall motion abnormalities. Left ventricular diastolic parameters are consistent with Grade I diastolic dysfunction (impaired relaxation).  2. Right ventricular systolic function is normal. The right ventricular size is normal. There is normal pulmonary artery systolic pressure. The estimated right ventricular systolic pressure is 78.4 mmHg.  3. The mitral valve is grossly normal. Trivial mitral valve regurgitation.  4. The aortic valve is tricuspid. There is mild calcification of the aortic valve. There is mild thickening of the aortic valve. Aortic valve regurgitation is mild  to moderate. Aortic valve sclerosis/calcification is present, without any evidence of aortic stenosis.  5. The inferior vena cava is normal in size with greater than 50% respiratory variability, suggesting right atrial pressure of 3 mmHg. Comparison(s): No significant change from prior study. FINDINGS  Left Ventricle: Left ventricular ejection fraction, by estimation, is 60 to 65%. The left ventricle has normal  function. The left ventricle has no regional wall motion abnormalities. The left ventricular internal cavity size was normal in size. There is  no left ventricular hypertrophy. Left ventricular diastolic parameters are consistent with Grade I diastolic dysfunction (impaired relaxation). Right Ventricle: The right ventricular size is normal. No increase in right ventricular wall thickness. Right ventricular systolic function is normal. There is normal pulmonary artery systolic pressure. The tricuspid regurgitant velocity is 2.57 m/s, and  with an assumed right atrial pressure of 3 mmHg, the estimated right ventricular systolic pressure is 99.2 mmHg. Left Atrium: Left atrial size was normal in size. Right Atrium: Right atrial size was normal in size. Pericardium: There is no evidence of pericardial effusion. Mitral Valve: The mitral valve is grossly normal. There is mild thickening of the mitral valve leaflet(s). There is mild calcification of the mitral valve leaflet(s). Trivial mitral valve regurgitation. Tricuspid Valve: The tricuspid valve is normal in structure. Tricuspid valve regurgitation is mild. Aortic Valve: The aortic valve is tricuspid. There is mild calcification of the aortic valve. There is mild thickening of the aortic valve. Aortic valve regurgitation is mild to moderate. Aortic valve sclerosis/calcification is present, without any evidence of aortic stenosis. Pulmonic Valve: The pulmonic valve was not well visualized. Aorta: The aortic root and ascending aorta are structurally normal, with no  evidence of dilitation. Venous: The inferior vena cava is normal in size with greater than 50% respiratory variability, suggesting right atrial pressure of 3 mmHg. IAS/Shunts: The atrial septum is grossly normal.  LEFT VENTRICLE PLAX 2D LVIDd:         3.40 cm     Diastology LVIDs:         2.60 cm     LV e' medial:    7.51 cm/s LV PW:         0.80 cm     LV E/e' medial:  7.8 LV IVS:        1.00 cm     LV e' lateral:   6.42 cm/s LVOT diam:     2.00 cm     LV E/e' lateral: 9.2 LV SV:         70 LV SV Index:   45 LVOT Area:     3.14 cm  LV Volumes (MOD) LV vol d, MOD A2C: 63.7 ml LV vol d, MOD A4C: 55.2 ml LV vol s, MOD A2C: 20.0 ml LV vol s, MOD A4C: 20.7 ml LV SV MOD A2C:     43.7 ml LV SV MOD A4C:     55.2 ml LV SV MOD BP:      40.0 ml RIGHT VENTRICLE             IVC RV S prime:     14.90 cm/s  IVC diam: 1.80 cm TAPSE (M-mode): 1.4 cm LEFT ATRIUM             Index        RIGHT ATRIUM          Index LA diam:        2.70 cm 1.75 cm/m   RA Area:     8.27 cm LA Vol (A2C):   26.9 ml 17.46 ml/m  RA Volume:   14.40 ml 9.35 ml/m LA Vol (A4C):   16.6 ml 10.78 ml/m LA Biplane Vol: 21.9 ml 14.22 ml/m  AORTIC VALVE LVOT Vmax:   121.00 cm/s LVOT Vmean:  80.600 cm/s LVOT VTI:    0.223 m  AORTA Ao Root  diam: 2.70 cm Ao Asc diam:  3.40 cm MITRAL VALVE                TRICUSPID VALVE MV Area (PHT): 2.09 cm     TR Peak grad:   26.4 mmHg MV Decel Time: 363 msec     TR Vmax:        257.00 cm/s MV E velocity: 58.80 cm/s MV A velocity: 107.00 cm/s  SHUNTS MV E/A ratio:  0.55         Systemic VTI:  0.22 m                             Systemic Diam: 2.00 cm Gwyndolyn Kaufman MD Electronically signed by Gwyndolyn Kaufman MD Signature Date/Time: 09/16/2022/3:42:32 PM    Final     Scheduled Meds:  atorvastatin  40 mg Oral Daily   azithromycin  250 mg Oral Daily   enoxaparin (LOVENOX) injection  40 mg Subcutaneous Q24H   fluticasone furoate-vilanterol  1 puff Inhalation Daily   ipratropium-albuterol  3 mL Nebulization TID    lidocaine  1 patch Transdermal Q24H   predniSONE  40 mg Oral Q breakfast   umeclidinium bromide  1 puff Inhalation Daily   Continuous Infusions:   LOS: 1 day   Time spent: 7mn  Mamoru Takeshita C Adley Castello, DO [facility]  If 7PM-7AM, please contact night-coverage [email or web address]  09/17/2022, 7:51 AM

## 2022-09-18 ENCOUNTER — Telehealth: Payer: Self-pay | Admitting: Orthopedic Surgery

## 2022-09-18 DIAGNOSIS — J441 Chronic obstructive pulmonary disease with (acute) exacerbation: Secondary | ICD-10-CM | POA: Diagnosis not present

## 2022-09-18 MED ORDER — PHENOL 1.4 % MT LIQD
1.0000 | OROMUCOSAL | Status: DC | PRN
  Administered 2022-09-18: 1 via OROMUCOSAL
  Filled 2022-09-18: qty 177

## 2022-09-18 NOTE — Progress Notes (Signed)
PROGRESS NOTE    Margaret Henry  NUU:[id number] DOB: 1951/06/20 DOA: 09/14/2022 PCP: [name], MD  Brief Narrative:  Margaret Henry is a 71 y.o. female with medical history significant for COPD (on chronic prednisone azithromycin), chronic respiratory failure with hypoxia on 3 L O2 via [place], HLD, GERD, chronic back pain with L1/L3 vertebral fractures who is admitted with COPD exacerbation.   Assessment & Plan:   Principal Problem:   COPD with acute exacerbation ([facility]) Active Problems:   Hyperlipidemia   Chronic respiratory failure with hypoxia (HCC)   COPD exacerbation (HCC)  COPD with acute exacerbation (HCC) Severe COPD, Gold Stage D - Advanced disease following with pulmonology in the outpatient setting on chronic prednisone and azithromycin -Continue steroid taper, plan to taper back to patient's 10 mg daily prednisone over the next 10 days to 2 weeks - Baseline oxygen use 3 L nasal cannula around-the-clock  -Continues to have profound symptoms with exertion - drops to 82% with profound tachycardia on even short <50 foot walk -Continue supportive care, nebs, home inhaled medications and chronic azithromycin -Wean oxygen as tolerated   Chronic back pain L1/L3 vertebral fractures Acute on chronic low back pain that began 3 weeks prior to admission from possibly twisting it wrong.   -Evaluated in orthopedic clinic Friday before admission having received Toradol shot with no improvement in symptoms.   -Plan for outpatient MRI per orthopedics as scheduled but patient currently unable to tolerate lying flat for long enough to obtain image currently. - Lidocaine patch - Tramadol as needed   Chronic respiratory failure with hypoxia (HCC) 3 L nasal cannula baseline oxygen at home around-the-clock   Hyperlipidemia Continue atorvastatin.   DVT prophylaxis: enoxaparin (LOVENOX) injection 40 mg  Code Status: Full code Family Communication: Discussed with patient, she prefers to  discuss with her family  Status is: Inpatient  Dispo: The patient is from: Home              Anticipated d/c is to: Home              Anticipated d/c date is: 24 to 48 hours              Patient currently not medically stable for discharge  Consultants:  None  Procedures:  None  Antimicrobials:  Chronic azithromycin  Subjective: No acute issues or events overnight respiratory status minimally improving but remains markedly dyspneic with exertion  Objective: Vitals:   09/17/22 0859 09/17/22 1258 09/17/22 1412 09/18/22 0448  BP:  (!) 116/54  (!) 130/52  Pulse:  (!) 105  89  Resp:  17  17  Temp:  98.6 F (37 C)  98.2 F (36.8 C)  TempSrc:    Oral  SpO2: 90% 97% 98% 97%  Weight:      Height:        Intake/Output Summary (Last 24 hours) at 09/18/2022 0741 Last data filed at 09/17/2022 0841 Gross per 24 hour  Intake 118 ml  Output --  Net 118 ml    Filed Weights   09/15/22 1332  Weight: 51.7 kg    Examination:  General:  Pleasantly resting in bed, No acute distress. HEENT:  Normocephalic atraumatic.  Sclerae nonicteric, noninjected.  Extraocular movements intact bilaterally. Neck:  Without mass or deformity.  Trachea is midline. Lungs: Coarse breath sounds bilaterally without overt rales, end expiratory wheeze Heart:  Regular rate and rhythm.  Without murmurs, rubs, or gallops. Abdomen:  Soft, nontender, nondistended.  Without guarding  or rebound. Extremities: Without cyanosis, clubbing, edema, or obvious deformity. Vascular:  Dorsalis pedis and posterior tibial pulses palpable bilaterally. Skin:  Warm and dry, no erythema, no ulcerations.  Data Reviewed: I have personally reviewed following labs and imaging studies  CBC: Recent Labs  Lab 09/14/22 1707 09/15/22 0349 09/16/22 0528 09/17/22 0518  WBC 11.0* 7.1 13.3* 8.6  NEUTROABS 7.5  --  10.5*  --   HGB 12.2 10.7* 9.8* 9.7*  HCT 38.7 33.8* 30.7* 31.4*  MCV 100.5* 99.7 100.0 102.3*  PLT 277 257 255  250    Basic Metabolic Panel: Recent Labs  Lab 09/14/22 1707 09/15/22 0349 09/16/22 0528 09/17/22 0518  NA 143 142 142 142  K 3.8 3.7 3.7 3.7  CL 108 112* 110 109  CO2 '26 22 27 28  '$ GLUCOSE 109* 152* 97 80  BUN '10 13 23 21  '$ CREATININE 0.34* 0.57 0.59 0.56  CALCIUM 8.9 8.8* 9.0 8.9    Thyroid Function Tests: Recent Labs    09/17/22 0518  TSH 0.627    Anemia Panel: Recent Labs    09/17/22 0518  FERRITIN 44  TIBC 248*  IRON 43    Sepsis Labs: Recent Labs  Lab 09/16/22 0528  PROCALCITON <0.10     Recent Results (from the past 240 hour(s))  Resp panel by RT-PCR (RSV, Flu A&B, Covid) Anterior Nasal Swab     Status: None   Collection Time: 09/14/22  5:22 PM   Specimen: Anterior Nasal Swab  Result Value Ref Range Status   SARS Coronavirus 2 by RT PCR NEGATIVE NEGATIVE Final    Comment: (NOTE) SARS-CoV-2 target nucleic acids are NOT DETECTED.  The SARS-CoV-2 RNA is generally detectable in upper respiratory specimens during the acute phase of infection. The lowest concentration of SARS-CoV-2 viral copies this assay can detect is 138 copies/mL. A negative result does not preclude SARS-Cov-2 infection and should not be used as the sole basis for treatment or other patient management decisions. A negative result may occur with  improper specimen collection/handling, submission of specimen other than nasopharyngeal swab, presence of viral mutation(s) within the areas targeted by this assay, and inadequate number of viral copies(<138 copies/mL). A negative result must be combined with clinical observations, patient history, and epidemiological information. The expected result is Negative.  Fact Sheet for Patients:  EntrepreneurPulse.com.au  Fact Sheet for Healthcare Providers:  IncredibleEmployment.be  This test is no t yet approved or cleared by the [place] FDA and  has been authorized for detection and/or diagnosis of  SARS-CoV-2 by FDA under an Emergency Use Authorization (EUA). This EUA will remain  in effect (meaning this test can be used) for the duration of the COVID-19 declaration under Section 564(b)(1) of the Act, 21 U.S.C.section 360bbb-3(b)(1), unless the authorization is terminated  or revoked sooner.       Influenza A by PCR NEGATIVE NEGATIVE Final   Influenza B by PCR NEGATIVE NEGATIVE Final    Comment: (NOTE) The Xpert Xpress SARS-CoV-2/FLU/RSV plus assay is intended as an aid in the diagnosis of influenza from Nasopharyngeal swab specimens and should not be used as a sole basis for treatment. Nasal washings and aspirates are unacceptable for Xpert Xpress SARS-CoV-2/FLU/RSV testing.  Fact Sheet for Patients: EntrepreneurPulse.com.au  Fact Sheet for Healthcare Providers: IncredibleEmployment.be  This test is not yet approved or cleared by the [place] FDA and has been authorized for detection and/or diagnosis of SARS-CoV-2 by FDA under an Emergency Use Authorization (EUA). This EUA will remain  in effect (meaning this test can be used) for the duration of the COVID-19 declaration under Section 564(b)(1) of the Act, 21 U.S.C. section 360bbb-3(b)(1), unless the authorization is terminated or revoked.     Resp Syncytial Virus by PCR NEGATIVE NEGATIVE Final    Comment: (NOTE) Fact Sheet for Patients: EntrepreneurPulse.com.au  Fact Sheet for Healthcare Providers: IncredibleEmployment.be  This test is not yet approved or cleared by the [place] FDA and has been authorized for detection and/or diagnosis of SARS-CoV-2 by FDA under an Emergency Use Authorization (EUA). This EUA will remain in effect (meaning this test can be used) for the duration of the COVID-19 declaration under Section 564(b)(1) of the Act, 21 U.S.C. section 360bbb-3(b)(1), unless the authorization is terminated  or revoked.  Performed at [facility], [place] [address], [place], [place] [address]          Radiology Studies: ECHOCARDIOGRAM COMPLETE  Result Date: 09/16/2022    ECHOCARDIOGRAM REPORT   Patient Name:   SHURONDA SANTINO Date of Exam: 09/16/2022 Medical Rec #:  [id number]      Height:       64.0 in Accession #:    [id number]     Weight:       114.0 lb Date of Birth:  01-Jan-1951      BSA:          1.541 m Patient Age:    45 years       BP:           117/54 mmHg Patient Gender: F              HR:           93 bpm. Exam Location:  Inpatient Procedure: 2D Echo, Cardiac Doppler and Color Doppler Indications:    Acute respiratory distress  History:        Patient has prior history of Echocardiogram examinations, most                 recent 06/14/2021. COPD, Signs/Symptoms:Dyspnea and Shortness of                 Breath; Risk Factors:Dyslipidemia and Former Smoker. Chronic                 respiratory failure, hx of cancer.  Sonographer:    Eartha Inch Referring Phys: [id number] Edwards  Sonographer Comments: Technically difficult study due to poor echo windows. Image acquisition challenging due to patient body habitus, Image acquisition challenging due to respiratory motion and Image acquisition challenging due to COPD. IMPRESSIONS  1. Left ventricular ejection fraction, by estimation, is 60 to 65%. The left ventricle has normal function. The left ventricle has no regional wall motion abnormalities. Left ventricular diastolic parameters are consistent with Grade I diastolic dysfunction (impaired relaxation).  2. Right ventricular systolic function is normal. The right ventricular size is normal. There is normal pulmonary artery systolic pressure. The estimated right ventricular systolic pressure is 59.1 mmHg.  3. The mitral valve is grossly normal. Trivial mitral valve regurgitation.  4. The aortic valve is tricuspid. There is mild calcification of the aortic valve. There is mild  thickening of the aortic valve. Aortic valve regurgitation is mild to moderate. Aortic valve sclerosis/calcification is present, without any evidence of aortic stenosis.  5. The inferior vena cava is normal in size with greater than 50% respiratory variability, suggesting right atrial pressure of 3 mmHg. Comparison(s): No significant change from prior study. FINDINGS  Left Ventricle: Left ventricular ejection fraction, by estimation, is 60 to 65%. The left ventricle has normal function. The left ventricle has no regional wall motion abnormalities. The left ventricular internal cavity size was normal in size. There is  no left ventricular hypertrophy. Left ventricular diastolic parameters are consistent with Grade I diastolic dysfunction (impaired relaxation). Right Ventricle: The right ventricular size is normal. No increase in right ventricular wall thickness. Right ventricular systolic function is normal. There is normal pulmonary artery systolic pressure. The tricuspid regurgitant velocity is 2.57 m/s, and  with an assumed right atrial pressure of 3 mmHg, the estimated right ventricular systolic pressure is 98.3 mmHg. Left Atrium: Left atrial size was normal in size. Right Atrium: Right atrial size was normal in size. Pericardium: There is no evidence of pericardial effusion. Mitral Valve: The mitral valve is grossly normal. There is mild thickening of the mitral valve leaflet(s). There is mild calcification of the mitral valve leaflet(s). Trivial mitral valve regurgitation. Tricuspid Valve: The tricuspid valve is normal in structure. Tricuspid valve regurgitation is mild. Aortic Valve: The aortic valve is tricuspid. There is mild calcification of the aortic valve. There is mild thickening of the aortic valve. Aortic valve regurgitation is mild to moderate. Aortic valve sclerosis/calcification is present, without any evidence of aortic stenosis. Pulmonic Valve: The pulmonic valve was not well visualized. Aorta:  The aortic root and ascending aorta are structurally normal, with no evidence of dilitation. Venous: The inferior vena cava is normal in size with greater than 50% respiratory variability, suggesting right atrial pressure of 3 mmHg. IAS/Shunts: The atrial septum is grossly normal.  LEFT VENTRICLE PLAX 2D LVIDd:         3.40 cm     Diastology LVIDs:         2.60 cm     LV e' medial:    7.51 cm/s LV PW:         0.80 cm     LV E/e' medial:  7.8 LV IVS:        1.00 cm     LV e' lateral:   6.42 cm/s LVOT diam:     2.00 cm     LV E/e' lateral: 9.2 LV SV:         70 LV SV Index:   45 LVOT Area:     3.14 cm  LV Volumes (MOD) LV vol d, MOD A2C: 63.7 ml LV vol d, MOD A4C: 55.2 ml LV vol s, MOD A2C: 20.0 ml LV vol s, MOD A4C: 20.7 ml LV SV MOD A2C:     43.7 ml LV SV MOD A4C:     55.2 ml LV SV MOD BP:      40.0 ml RIGHT VENTRICLE             IVC RV S prime:     14.90 cm/s  IVC diam: 1.80 cm TAPSE (M-mode): 1.4 cm LEFT ATRIUM             Index        RIGHT ATRIUM          Index LA diam:        2.70 cm 1.75 cm/m   RA Area:     8.27 cm LA Vol (A2C):   26.9 ml 17.46 ml/m  RA Volume:   14.40 ml 9.35 ml/m LA Vol (A4C):   16.6 ml 10.78 ml/m LA Biplane Vol: 21.9 ml 14.22 ml/m  AORTIC VALVE LVOT Vmax:   121.00  cm/s LVOT Vmean:  80.600 cm/s LVOT VTI:    0.223 m  AORTA Ao Root diam: 2.70 cm Ao Asc diam:  3.40 cm MITRAL VALVE                TRICUSPID VALVE MV Area (PHT): 2.09 cm     TR Peak grad:   26.4 mmHg MV Decel Time: 363 msec     TR Vmax:        257.00 cm/s MV E velocity: 58.80 cm/s MV A velocity: 107.00 cm/s  SHUNTS MV E/A ratio:  0.55         Systemic VTI:  0.22 m                             Systemic Diam: 2.00 cm Gwyndolyn Kaufman MD Electronically signed by Gwyndolyn Kaufman MD Signature Date/Time: 09/16/2022/3:42:32 PM    Final     Scheduled Meds:  atorvastatin  40 mg Oral Daily   azithromycin  250 mg Oral Daily   enoxaparin (LOVENOX) injection  40 mg Subcutaneous Q24H   fluticasone furoate-vilanterol  1 puff  Inhalation Daily   ipratropium-albuterol  3 mL Nebulization TID   lidocaine  1 patch Transdermal Q24H   predniSONE  40 mg Oral Q breakfast   umeclidinium bromide  1 puff Inhalation Daily   Continuous Infusions:   LOS: 2 days   Time spent: 48mn  Keilin Gamboa C Dashea Mcmullan, DO [facility]  If 7PM-7AM, please contact night-coverage [email or web address]  09/18/2022, 7:41 AM

## 2022-09-18 NOTE — Progress Notes (Signed)
Mobility Specialist - Progress Note   09/18/22 0927  Oxygen Therapy  O2 Device Nasal Cannula  O2 Flow Rate (L/min) 3 L/min  Mobility  Activity Ambulated with assistance in hallway  Level of Assistance Standby assist, set-up cues, supervision of patient - no hands on  Assistive Device Front wheel walker  Distance Ambulated (ft) 160 ft  Activity Response Tolerated well  Mobility Referral Yes  $Mobility charge 1 Mobility   Pt received in recliner and agreeable to mobility. Pt c/o back pain rating it a 9/10. Pt stated she had just taken pain meds for her back. Pt did mention that her breathing felt labored during mobility session. Could not take O2 during session due to pulseox being dead. No other complaints during mobility.  Pt to recliner after session with all needs met.     [facility]

## 2022-09-18 NOTE — Telephone Encounter (Signed)
Patient states she had lower back pain and has been in hospital and is still in patient. Patient is asking if Dr. Laurance Flatten, can order some type of back brace due to unable to walk and is unstable. Patient is in [place] room [phone]. Please advise.

## 2022-09-19 DIAGNOSIS — E785 Hyperlipidemia, unspecified: Secondary | ICD-10-CM

## 2022-09-19 DIAGNOSIS — J441 Chronic obstructive pulmonary disease with (acute) exacerbation: Secondary | ICD-10-CM | POA: Diagnosis not present

## 2022-09-19 DIAGNOSIS — J9611 Chronic respiratory failure with hypoxia: Secondary | ICD-10-CM | POA: Diagnosis not present

## 2022-09-19 MED ORDER — SENNOSIDES-DOCUSATE SODIUM 8.6-50 MG PO TABS
2.0000 | ORAL_TABLET | Freq: Two times a day (BID) | ORAL | Status: DC
  Administered 2022-09-19 – 2022-09-21 (×4): 2 via ORAL
  Filled 2022-09-19 (×5): qty 2

## 2022-09-19 MED ORDER — BISACODYL 5 MG PO TBEC: 10.0000 mg | DELAYED_RELEASE_TABLET | Freq: Every day | ORAL | Status: DC | PRN

## 2022-09-19 MED ORDER — METHOCARBAMOL 500 MG PO TABS
500.0000 mg | ORAL_TABLET | Freq: Three times a day (TID) | ORAL | Status: DC | PRN
  Administered 2022-09-19: 500 mg via ORAL
  Filled 2022-09-19: qty 1

## 2022-09-19 MED ORDER — POLYETHYLENE GLYCOL 3350 17 G PO PACK
17.0000 g | PACK | Freq: Every day | ORAL | Status: DC
  Administered 2022-09-19 – 2022-09-21 (×3): 17 g via ORAL
  Filled 2022-09-19 (×3): qty 1

## 2022-09-19 NOTE — Progress Notes (Signed)
Mobility Specialist - Progress Note   09/19/22 0957  Mobility  Activity Ambulated with assistance in hallway;Transferred to/from [facility]  Level of Assistance Standby assist, set-up cues, supervision of patient - no hands on  Assistive Device Front wheel walker  Distance Ambulated (ft) 80 ft  Activity Response Tolerated well  Mobility Referral Yes  $Mobility charge 1 Mobility   Pt received in recliner and agreeable to mobility after needing assistance to [facility]. C/o legs aching which prompted in short ambulation. Pt stated that her legs aching was due to her back pain. No other complaints during mobility. Pt to recliner after session with all needs met & MD in room.  [facility]

## 2022-09-19 NOTE — Progress Notes (Signed)
Orthopedic Tech Progress Note Patient Details:  Margaret Henry June 09, 1951 [id number]  Patient ID: Margaret Henry, female   DOB: [birth year], 71 y.o.   MRN: [id number]  [name] 09/19/2022, 11:07 AM TLSO delivered to room for use when OOB

## 2022-09-19 NOTE — Progress Notes (Signed)
Physical Therapy Treatment Patient Details Name: Margaret Henry MRN: [id number] DOB: 27-Aug-1951 Today's Date: 09/19/2022   History of Present Illness Patient is a 71 year old female who presented with dyspnea. patient was admitted with COPD exacerbation, acute on chronic low back pain with L1/L3 vertebral fractures. PMH: skin cancer, anemia, emphysema of lung, osteoporosis, oxygen deficiency.    PT Comments    Today's PT treatment focused on ambulation/activity tolerance. Pt ambulated 29f with use of RW and decreased assist only requiring supervision this session. Education/demonstration on application of TLSO brace to be worn when OOB per new orders. Pt reports some mild relief when ambulating with TLSO brace, but remains limited with activity tolerance due to increased pain levels in low back. Pt will benefit from continued skilled PT to increase their independence and maximize safety with mobility.     Recommendations for follow up therapy are one component of a multi-disciplinary discharge planning process, led by the attending physician.  Recommendations may be updated based on patient status, additional functional criteria and insurance authorization.  Follow Up Recommendations  Home health PT     Assistance Recommended at Discharge Set up Supervision/Assistance  Patient can return home with the following Assistance with cooking/housework;Help with stairs or ramp for entrance;A little help with bathing/dressing/bathroom   Equipment Recommendations  None recommended by PT    Recommendations for Other Services       Precautions / Restrictions Precautions Precautions: Fall;Other (comment) (spinal) Precaution Comments: monitor HR/SATS, on 3 LPM Required Braces or Orthoses: Spinal Brace Spinal Brace: Thoracolumbosacral orthotic;Other (comment) (when OOB) Spinal Brace Comments: Pt received TLSO brace today (09/19/22) Restrictions Weight Bearing Restrictions: No     Mobility   Bed Mobility Overal bed mobility:  (pt sitting EOB upon entry and EOB at end)                  Transfers Overall transfer level: Needs assistance Equipment used: Rolling walker (2 wheels) Transfers: Sit to/from Stand Sit to Stand: Supervision           General transfer comment: use of UEs for power up    Ambulation/Gait Ambulation/Gait assistance: Supervision Gait Distance (Feet): 60 Feet Assistive device: Rolling walker (2 wheels) Gait Pattern/deviations: Step-through pattern Gait velocity: decr     General Gait Details: slow  speed, stopped x 1 , stand break due to SOB and LB pain. Reports TLSO provided some relief while ambulating, but still increased pain levels limiting further ambulation distance.   Stairs             Wheelchair Mobility    Modified Rankin (Stroke Patients Only)       Balance Overall balance assessment: Needs assistance Sitting-balance support: Feet supported Sitting balance-Leahy Scale: Fair     Standing balance support: Bilateral upper extremity supported, During functional activity Standing balance-Leahy Scale: Fair                              Cognition Arousal/Alertness: Awake/alert Behavior During Therapy: WFL for tasks assessed/performed Overall Cognitive Status: Within Functional Limits for tasks assessed                                          Exercises      General Comments        Pertinent Vitals/Pain Pain Assessment Pain Assessment: 0-10  Pain Score: 10-Worst pain ever Pain Location: low back Pain Descriptors / Indicators: Grimacing, Guarding, Discomfort, Constant ("feels like my back is breaking") Pain Intervention(s): Limited activity within patient's tolerance, Monitored during session, Other (comment) (TLSO applied)    Home Living                          Prior Function            PT Goals (current goals can now be found in the care plan section)  Acute Rehab PT Goals PT Goal Formulation: With patient Time For Goal Achievement: 10/01/22 Potential to Achieve Goals: Fair Progress towards PT goals: Progressing toward goals    Frequency    Min 3X/week      PT Plan Current plan remains appropriate    Co-evaluation              AM-PAC PT "6 Clicks" Mobility   Outcome Measure  Help needed turning from your back to your side while in a flat bed without using bedrails?: None Help needed moving from lying on your back to sitting on the side of a flat bed without using bedrails?: None Help needed moving to and from a bed to a chair (including a wheelchair)?: A Little Help needed standing up from a chair using your arms (e.g., wheelchair or bedside chair)?: A Little Help needed to walk in hospital room?: A Little Help needed climbing 3-5 steps with a railing? : A Lot 6 Click Score: 19    End of Session Equipment Utilized During Treatment:  (pt politely refusing use of gait belt) Activity Tolerance: Patient limited by fatigue;Patient limited by pain Patient left: in bed;with call bell/phone within reach (RN aware pt sitting EOB, pt has been transferring independently to [facility] in room- no bed alarm on pre/post session) Nurse Communication: Mobility status;Other (comment) (TLSO when OOB (instruction packet present)) PT Visit Diagnosis: Unsteadiness on feet (R26.81);Difficulty in walking, not elsewhere classified (R26.2);Pain Pain - Right/Left:  (midline) Pain - part of body:  (lower back)     Time: 1710-1730 PT Time Calculation (min) (ACUTE ONLY): 20 min  Charges:  $Therapeutic Activity: 8-22 mins                     Festus Barren PT, DPT  Acute Rehabilitation Services  Office [phone]  09/19/2022, 6:15 PM

## 2022-09-19 NOTE — Progress Notes (Signed)
[facility]                                                                               Margaret Henry, is a 71 y.o. female, DOB - 1951-05-19, XBM:[id number] Admit date - 09/14/2022    Outpatient Primary MD for the patient is [name], MD  LOS - 3  days    Brief summary   Margaret Henry is a 71 y.o. female with medical history significant for COPD (on chronic prednisone azithromycin), chronic respiratory failure with hypoxia on 3 L O2 via [place], HLD, GERD, chronic back pain with L1/L3 vertebral fractures who is admitted with COPD exacerbation.    Assessment & Plan    Assessment and Plan:  Acute copd exacerbation: acute on chronic respiratory failure with hypoxia.  Much improved.  Continue with steroid taper. Continue with supportive care.  Wean oxygen as tolerated. Patient back to 3 lit of [place] oxygen.    Hyperlipidemia:  Resume statin.     Chronic back pain/ L1/L3 vertebral fracture.  Recommend outpatient MRi per orthopedics as scheduled.  Pain control.  Back Brace.         Estimated body mass index is 19.56 kg/m as calculated from the following:   Height as of this encounter: '5\' 4"'$  (1.626 m).   Weight as of this encounter: 51.7 kg.  Code Status full code.   DVT Prophylaxis:  enoxaparin (LOVENOX) injection 40 mg Start: 09/14/22 2200   Level of Care: Level of care: Med-Surg Family Communication: none at bedside.   Disposition Plan:     Remains inpatient appropriate:  discharge when pain is better controlled.   Procedures:  None.   Consultants:   None.   Antimicrobials:   Anti-infectives (From admission, onward)    Start     Dose/Rate Route Frequency Ordered Stop   09/15/22 1000  azithromycin (ZITHROMAX) tablet 250 mg        250 mg Oral Daily 09/14/22 2146          Medications  Scheduled Meds:  atorvastatin  40 mg Oral Daily   azithromycin  250 mg Oral Daily   enoxaparin (LOVENOX) injection  40 mg Subcutaneous Q24H    fluticasone furoate-vilanterol  1 puff Inhalation Daily   ipratropium-albuterol  3 mL Nebulization TID   lidocaine  1 patch Transdermal Q24H   polyethylene glycol  17 g Oral Daily   predniSONE  40 mg Oral Q breakfast   senna-docusate  2 tablet Oral BID   umeclidinium bromide  1 puff Inhalation Daily   Continuous Infusions: PRN Meds:.acetaminophen **OR** acetaminophen, alum & mag hydroxide-simeth, benzonatate, bisacodyl, ibuprofen, levalbuterol, methocarbamol, ondansetron **OR** ondansetron (ZOFRAN) IV, phenol, traMADol    Subjective:   Cheryln Balcom was seen and examined today.  Reports some back pain.    Objective:   Vitals:   09/18/22 2044 09/18/22 2047 09/19/22 0506 09/19/22 0831  BP: (!) 106/47  (!) 121/47   Pulse: 87 93 80   Resp: 18  18   Temp: 97.8 F (36.6 C)  97.8 F (36.6 C)   TempSrc: Oral  Oral   SpO2:  99% 100% 94%  Weight:  Height:       No intake or output data in the 24 hours ending 09/19/22 1302 Filed Weights   09/15/22 1332  Weight: 51.7 kg     Exam General: Alert and oriented x 3, NAD Cardiovascular: S1 S2 auscultated, no murmurs, RRR Respiratory: Clear to auscultation bilaterally, no wheezing, rales or rhonchi Gastrointestinal: Soft, nontender, nondistended, + bowel sounds Ext: no pedal edema bilaterally Neuro: AAOx3, Cr N's II- XII. Strength 5/5 upper and lower extremities bilaterally Skin: No rashes Psych: Normal affect and demeanor, alert and oriented x3    Data Reviewed:  I have personally reviewed following labs and imaging studies   CBC Lab Results  Component Value Date   WBC 8.6 09/17/2022   RBC 3.07 (L) 09/17/2022   HGB 9.7 (L) 09/17/2022   HCT 31.4 (L) 09/17/2022   MCV 102.3 (H) 09/17/2022   MCH 31.6 09/17/2022   PLT 217 09/17/2022   MCHC 30.9 09/17/2022   RDW 13.4 09/17/2022   LYMPHSABS 1.3 09/16/2022   MONOABS 1.4 (H) 09/16/2022   EOSABS 0.0 09/16/2022   BASOSABS 0.0 82/50/5397     Last metabolic panel Lab  Results  Component Value Date   NA 142 09/17/2022   K 3.7 09/17/2022   CL 109 09/17/2022   CO2 28 09/17/2022   BUN 21 09/17/2022   CREATININE 0.56 09/17/2022   GLUCOSE 80 09/17/2022   GFRNONAA >60 09/17/2022   GFRAA 109 11/18/2020   CALCIUM 8.9 09/17/2022   PHOS 3.5 06/18/2021   PROT 6.3 (L) 08/18/2022   ALBUMIN 3.3 (L) 08/18/2022   LABGLOB 2.3 07/04/2022   AGRATIO 1.9 07/04/2022   BILITOT 0.7 08/18/2022   ALKPHOS 89 08/18/2022   AST 15 08/18/2022   ALT 11 08/18/2022   ANIONGAP 5 09/17/2022    CBG (last 3)  No results for input(s): "GLUCAP" in the last 72 hours.    Coagulation Profile: No results for input(s): "INR", "PROTIME" in the last 168 hours.   Radiology Studies: No results found.     Hosie Poisson M.D. [facility] 09/19/2022, 1:02 PM  Available via Epic secure chat 7am-7pm After 7 pm, please refer to night coverage provider listed on amion.

## 2022-09-19 NOTE — Care Management Important Message (Signed)
Important Message  Patient Details IM Letter given to the Patient. Name: Margaret Henry MRN: [id number] Date of Birth: 1951/01/02   Medicare Important Message Given:  Yes     Kerin Salen 09/19/2022, 11:49 AM

## 2022-09-20 NOTE — Progress Notes (Signed)
Occupational Therapy Treatment Patient Details Name: Margaret Henry MRN: [id number] DOB: August 06, 1951 Today's Date: 09/20/2022   History of present illness Patient is a 71 year old female who presented with dyspnea. patient was admitted with COPD exacerbation, acute on chronic low back pain with L1/L3 vertebral fractures. PMH: skin cancer, anemia, emphysema of lung, osteoporosis, oxygen deficiency.   OT comments  Treatment focused on reiterating back precautions, educating on compensatory strategies to protect back and use of AE. Therapist recommended LHR for home. Patient also educated on energy conservation strategies for COPD. Patient has assistance of son for IALDs. No further acute care OT needs. Defer further OT to [facility].   Recommendations for follow up therapy are one component of a multi-disciplinary discharge planning process, led by the attending physician.  Recommendations may be updated based on patient status, additional functional criteria and insurance authorization.    Follow Up Recommendations  Home health OT     Assistance Recommended at Discharge Intermittent Supervision/Assistance  Patient can return home with the following  Assistance with cooking/housework;Direct supervision/assist for medications management;Assist for transportation;Help with stairs or ramp for entrance;Direct supervision/assist for financial management   Equipment Recommendations  None recommended by OT    Recommendations for Other Services      Precautions / Restrictions Precautions Precautions: Back Required Braces or Orthoses: Spinal Brace Spinal Brace: Thoracolumbosacral orthotic;Other (comment) Spinal Brace Comments: Pt received TLSO brace today (09/19/22) Restrictions Weight Bearing Restrictions: No       Mobility Bed Mobility               General bed mobility comments: up in chiar    Transfers Overall transfer level: Modified independent Equipment used: Rolling walker (2  wheels)                     Balance Overall balance assessment: Mild deficits observed, not formally tested                                         ADL either performed or assessed with clinical judgement   ADL Overall ADL's : Needs assistance/impaired                     Lower Body Dressing: Independent Lower Body Dressing Details (indicate cue type and reason): able to don socks with figure four postiioning. Toilet Transfer: Independent;BSC/3in1   Writer and Hygiene: Sit to/from stand       Functional mobility during ADLs: Supervision/safety;Rolling walker (2 wheels) General ADL Comments: Able to don LB clothing using figure four method. Educated patient use on LHR to protect back. Educated patient on donning brace - though it is difficult for her. Also educated on energy conservation strategies for COPD.    Extremity/Trunk Assessment              Vision       Perception     Praxis      Cognition Arousal/Alertness: Awake/alert Behavior During Therapy: WFL for tasks assessed/performed Overall Cognitive Status: Within Functional Limits for tasks assessed                                          Exercises      Shoulder Instructions       General  Comments      Pertinent Vitals/ Pain       Pain Assessment Pain Assessment: Faces Faces Pain Scale: Hurts little more Pain Location: low back Pain Descriptors / Indicators: Grimacing, Guarding, Discomfort, Constant Pain Intervention(s): Monitored during session, Repositioned  Home Living                                          Prior Functioning/Environment              Frequency  Min 2X/week        Progress Toward Goals  OT Goals(current goals can now be found in the care plan section)  Progress towards OT goals: Goals met/education completed, patient discharged from OT  Acute Rehab OT Goals OT Goal  Formulation: All assessment and education complete, DC therapy  Plan Discharge plan remains appropriate    Co-evaluation                 AM-PAC OT "6 Clicks" Daily Activity     Outcome Measure   Help from another person eating meals?: None Help from another person taking care of personal grooming?: None Help from another person toileting, which includes using toliet, bedpan, or urinal?: None Help from another person bathing (including washing, rinsing, drying)?: A Little Help from another person to put on and taking off regular upper body clothing?: None Help from another person to put on and taking off regular lower body clothing?: A Little 6 Click Score: 22    End of Session Equipment Utilized During Treatment: Gait belt;Rolling walker (2 wheels)  OT Visit Diagnosis: Unsteadiness on feet (R26.81);Other abnormalities of gait and mobility (R26.89)   Activity Tolerance Patient tolerated treatment well   Patient Left in chair;with call bell/phone within reach   Nurse Communication Mobility status        Time: 8250-0370 OT Time Calculation (min): 17 min  Charges: OT General Charges $OT Visit: 1 Visit OT Treatments $Self Care/Home Management : 8-22 mins  Gustavo Lah, OTR/L [place]  Office [phone]   Lenward Chancellor 09/20/2022, 10:18 AM

## 2022-09-20 NOTE — Consult Note (Addendum)
   [facility] Inpatient Consult   09/20/2022  Margaret Henry May 16, 1951 [id number]  [facility] [employer] [ACO] Patient: Margaret Henry HMO SNP member  *[facility] Liaison remote coverage review for patient admitted to [facility]   Primary Care Provider:  [name], MD with [facility]  Reason for screen:  On [facility] ACO patient banner -    Less than 30 day readmission with COPD   Review of patient's medical record for past medical history and membership affiliate roster reveals 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   For additional questions or referrals please contact:    Plan: Will sign off.  Patient's PCP is listed to provide TOC follow up call/appointment  For questions contact:   Natividad Brood, RN BSN [place]  [phone] business mobile phone Toll free office [phone]  *[place]  [phone] Fax number: [phone] [email or web address]    .

## 2022-09-20 NOTE — Progress Notes (Signed)
[facility]                                                                               Margaret Henry, is a 71 y.o. female, DOB - [birth year], XNT:[id number] Admit date - 09/14/2022    Outpatient Primary MD for the patient is [name], MD  LOS - 4  days    Brief summary   Margaret Henry is a 71 y.o. female with medical history significant for COPD (on chronic prednisone azithromycin), chronic respiratory failure with hypoxia on 3 L O2 via [place], HLD, GERD, chronic back pain with L1/L3 vertebral fractures who is admitted with COPD exacerbation.    Assessment & Plan    Assessment and Plan:  Acute copd exacerbation: acute on chronic respiratory failure with hypoxia.  Much improved.  Continue with steroid taper. Continue with supportive care.  Wean oxygen as tolerated. Patient back to 3 lit of [place] oxygen.  NO WHEEZING ON EXAM today.   Hyperlipidemia:  Resume statin.     Chronic back pain/ L1/L3 vertebral fracture.  Recommend outpatient MRi per orthopedics as scheduled.  Pain control.  Back Brace. Patient does nt want to be discharged today. She reports she wants to use the Brace and make sure her pain is well controlled on brace before she goes home.         Estimated body mass index is 19.56 kg/m as calculated from the following:   Height as of this encounter: '5\' 4"'$  (1.626 m).   Weight as of this encounter: 51.7 kg.  Code Status full code.   DVT Prophylaxis:  enoxaparin (LOVENOX) injection 40 mg Start: 09/14/22 2200   Level of Care: Level of care: Med-Surg Family Communication: none at bedside.   Disposition Plan:     Remains inpatient appropriate:  discharge when pain is better controlled.   Procedures:  None.   Consultants:   None.   Antimicrobials:   Anti-infectives (From admission, onward)    Start     Dose/Rate Route Frequency Ordered Stop   09/15/22 1000  azithromycin (ZITHROMAX) tablet 250 mg        250 mg Oral Daily 09/14/22  2146          Medications  Scheduled Meds:  atorvastatin  40 mg Oral Daily   azithromycin  250 mg Oral Daily   enoxaparin (LOVENOX) injection  40 mg Subcutaneous Q24H   fluticasone furoate-vilanterol  1 puff Inhalation Daily   ipratropium-albuterol  3 mL Nebulization TID   lidocaine  1 patch Transdermal Q24H   polyethylene glycol  17 g Oral Daily   senna-docusate  2 tablet Oral BID   umeclidinium bromide  1 puff Inhalation Daily   Continuous Infusions: PRN Meds:.acetaminophen **OR** acetaminophen, alum & mag hydroxide-simeth, benzonatate, bisacodyl, ibuprofen, levalbuterol, methocarbamol, ondansetron **OR** ondansetron (ZOFRAN) IV, phenol, traMADol    Subjective:   Margaret Henry was seen and examined today.  Not well accustomed to using the brace.  Wants to walk with the brace with PT.  Objective:   Vitals:   09/19/22 2101 09/20/22 0500 09/20/22 0947 09/20/22 1440  BP: (!) 111/51 (!) 119/43  98/61  Pulse: 90 86  (!)  122  Resp: '16 16  19  '$ Temp: 97.7 F (36.5 C) 98.2 F (36.8 C)  98.3 F (36.8 C)  TempSrc: Oral Oral    SpO2: 100% 100% 100% 96%  Weight:      Height:        Intake/Output Summary (Last 24 hours) at 09/20/2022 1549 Last data filed at 09/19/2022 1817 Gross per 24 hour  Intake 473 ml  Output --  Net 473 ml   Filed Weights   09/15/22 1332  Weight: 51.7 kg     Exam General exam: Appears calm and comfortable  Respiratory system: Clear to auscultation. Respiratory effort normal. Cardiovascular system: S1 & S2 heard, RRR. No JVD,No pedal edema. Gastrointestinal system: Abdomen is nondistended, soft and nontender.  Normal bowel sounds heard. Central nervous system: Alert and oriented. No focal neurological deficits. Extremities: Symmetric 5 x 5 power. Skin: No rashes, lesions or ulcers Psychiatry:Mood & affect appropriate.     Data Reviewed:  I have personally reviewed following labs and imaging studies   CBC Lab Results  Component Value  Date   WBC 8.6 09/17/2022   RBC 3.07 (L) 09/17/2022   HGB 9.7 (L) 09/17/2022   HCT 31.4 (L) 09/17/2022   MCV 102.3 (H) 09/17/2022   MCH 31.6 09/17/2022   PLT 217 09/17/2022   MCHC 30.9 09/17/2022   RDW 13.4 09/17/2022   LYMPHSABS 1.3 09/16/2022   MONOABS 1.4 (H) 09/16/2022   EOSABS 0.0 09/16/2022   BASOSABS 0.0 96/01/5408     Last metabolic panel Lab Results  Component Value Date   NA 142 09/17/2022   K 3.7 09/17/2022   CL 109 09/17/2022   CO2 28 09/17/2022   BUN 21 09/17/2022   CREATININE 0.56 09/17/2022   GLUCOSE 80 09/17/2022   GFRNONAA >60 09/17/2022   GFRAA 109 11/18/2020   CALCIUM 8.9 09/17/2022   PHOS 3.5 06/18/2021   PROT 6.3 (L) 08/18/2022   ALBUMIN 3.3 (L) 08/18/2022   LABGLOB 2.3 07/04/2022   AGRATIO 1.9 07/04/2022   BILITOT 0.7 08/18/2022   ALKPHOS 89 08/18/2022   AST 15 08/18/2022   ALT 11 08/18/2022   ANIONGAP 5 09/17/2022    CBG (last 3)  No results for input(s): "GLUCAP" in the last 72 hours.    Coagulation Profile: No results for input(s): "INR", "PROTIME" in the last 168 hours.   Radiology Studies: No results found.     Hosie Poisson M.D. [facility] 09/20/2022, 3:49 PM  Available via Epic secure chat 7am-7pm After 7 pm, please refer to night coverage provider listed on amion.

## 2022-09-20 NOTE — Progress Notes (Signed)
Mobility Specialist - Progress Note   09/20/22 1017  Oxygen Therapy  O2 Device Nasal Cannula  O2 Flow Rate (L/min) 3 L/min  Mobility  Activity Ambulated with assistance in hallway  Level of Assistance Standby assist, set-up cues, supervision of patient - no hands on  Assistive Device Front wheel walker  Distance Ambulated (ft) 80 ft  Activity Response Tolerated well  Mobility Referral Yes  $Mobility charge 1 Mobility   Pt received in recliner and agreeable to mobility. C/o back pain, SOB & legs aching which prompted in short mobility session. No other complaints during session. Assisted pt w/ taking off back brace after ambulating in hall. Pt to recliner after session with all needs met.     Maya Johnson Mobility Specialist  

## 2022-09-20 NOTE — TOC Transition Note (Signed)
Transition of Care [facility]) - CM/SW Discharge Note   Patient Details  Name: Margaret Henry MRN: [id number] Date of Birth: 08/24/1951  [facility] CM/SW Contact:  Vassie Moselle, [place] Phone Number: 09/20/2022, 10:03 AM   Clinical Narrative:    Pt is to return home with HHPT/OT/RN through [place]. No further TOC needs identified.    Final next level of care: [place] Barriers to Discharge: Barriers Resolved   Patient Goals and CMS Choice Patient states their goals for this hospitalization and ongoing recovery are:: To go home CMS Medicare.gov Compare Post Acute Care list provided to:: Patient Choice offered to / list presented to : Patient  Discharge Placement                       Discharge Plan and Services In-house Referral: NA Discharge Planning Services: NA            DME Arranged: N/A DME Agency: NA       HH Arranged: RN, PT, OT HH Agency: [place] Date [place]: 09/17/22 Time [place]: [id number] Representative spoke with at [place]: [place] Determinants of Health ([place]) Interventions Housing Interventions: Intervention Not Indicated   Readmission Risk Interventions    09/20/2022   10:02 AM 09/17/2022   11:43 AM 08/20/2022    1:29 PM  Readmission Risk Prevention Plan  Post Dischage Appt   Complete  Medication Screening   Complete  Transportation Screening Complete Complete Complete  PCP or Specialist Appt within 5-7 Days Complete Complete   Home Care Screening Complete Complete   Medication Review (RN CM) Complete Complete

## 2022-09-20 NOTE — Progress Notes (Signed)
Mobility Specialist - Progress Note   09/20/22 1402  Mobility  Activity Ambulated with assistance in hallway  Level of Assistance Standby assist, set-up cues, supervision of patient - no hands on  Assistive Device Front wheel walker  Distance Ambulated (ft) 100 ft  Activity Response Tolerated well  Mobility Referral Yes  $Mobility charge 1 Mobility   Pt received in recliner and agreeable to mobility. C/o legs aching during mobility. Pt to recliner after session with all needs met & awaiting lunch.   [facility]

## 2022-09-21 ENCOUNTER — Other Ambulatory Visit (HOSPITAL_BASED_OUTPATIENT_CLINIC_OR_DEPARTMENT_OTHER): Payer: Self-pay

## 2022-09-21 DIAGNOSIS — J441 Chronic obstructive pulmonary disease with (acute) exacerbation: Secondary | ICD-10-CM | POA: Diagnosis not present

## 2022-09-21 DIAGNOSIS — J9611 Chronic respiratory failure with hypoxia: Secondary | ICD-10-CM | POA: Diagnosis not present

## 2022-09-21 MED ORDER — POLYETHYLENE GLYCOL 3350 17 GM/SCOOP PO POWD
17.0000 g | Freq: Every day | ORAL | 0 refills | Status: DC
  Filled 2022-09-21: qty 510, 30d supply, fill #0

## 2022-09-21 MED ORDER — SENNOSIDES-DOCUSATE SODIUM 8.6-50 MG PO TABS: 2.0000 | ORAL_TABLET | Freq: Two times a day (BID) | ORAL | Status: DC

## 2022-09-21 MED ORDER — BISACODYL 5 MG PO TBEC
10.0000 mg | DELAYED_RELEASE_TABLET | Freq: Every day | ORAL | 0 refills | Status: DC | PRN
  Filled 2022-09-21: qty 100, 50d supply, fill #0

## 2022-09-21 MED ORDER — METHOCARBAMOL 500 MG PO TABS
500.0000 mg | ORAL_TABLET | Freq: Three times a day (TID) | ORAL | 0 refills | Status: DC | PRN
  Filled 2022-09-21: qty 20, 7d supply, fill #0

## 2022-09-21 NOTE — Progress Notes (Signed)
DC order recd. Dc instructions reviewed with pt who voices understanding. Pt voices no concerns or c/o. No distress noted. Pt travels by wheelchair to main entrance to return home via pov. All belongings accompany pt.

## 2022-09-21 NOTE — Progress Notes (Signed)
Mobility Specialist - Progress Note   09/21/22 0928  Oxygen Therapy  SpO2 94 %  O2 Device Nasal Cannula  O2 Flow Rate (L/min) 3 L/min  Mobility  Activity Ambulated with assistance in hallway  Level of Assistance Standby assist, set-up cues, supervision of patient - no hands on  Assistive Device Front wheel walker;Other (Comment) (back brace)  Distance Ambulated (ft) 120 ft  Activity Response Tolerated well  Mobility Referral Yes  $Mobility charge 1 Mobility   Pt received in chair and agreed to mobility, had no c/o pain nor discomfort during session. Pt took one standing rest break due to feeling winded but SpO2 was at 91% then returned to 94%. Pt back to chair with all needs met.   Roderick Pee Mobility Specialist

## 2022-09-22 NOTE — Discharge Summary (Signed)
Physician Discharge Summary   Patient: Margaret Henry MRN: [id number] DOB: 1951-02-11  Admit date:     09/14/2022  Discharge date: 09/21/2022  Discharge Physician: Hosie Poisson   PCP: [name], MD   Recommendations at discharge:  Please follow up with PCP in one week.   Discharge Diagnoses: Principal Problem:   COPD with acute exacerbation ([place]) Active Problems:   Hyperlipidemia   Chronic respiratory failure with hypoxia (HCC)   COPD exacerbation [facility])    Hospital Course: Margaret Henry is a 71 y.o. female with medical history significant for COPD (on chronic prednisone azithromycin), chronic respiratory failure with hypoxia on 3 L O2 via [place], HLD, GERD, chronic back pain with L1/L3 vertebral fractures who is admitted with COPD exacerbation.  12/10: Improved but still significant fatigue with exertion.  She reports significant low back pain so we will increase her pain medication.  Assessment and Plan: cute copd exacerbation: acute on chronic respiratory failure with hypoxia.  Much improved.  Continue with steroid taper. Continue with supportive care.  Wean oxygen as tolerated. Patient back to 3 lit of [place] oxygen.     Hyperlipidemia:  Resume statin.        Chronic back pain/ L1/L3 vertebral fracture.  Recommend outpatient MRi per orthopedics as scheduled.  Pain control.  She is discharged home with back brace. Home health PT ordered.      Estimated body mass index is 19.56 kg/m as calculated from the following:   Height as of this encounter: '5\' 4"'$  (1.626 m).   Weight as of this encounter: 51.7 kg   Consultants: none.  Procedures performed: none.   Disposition: Home Diet recommendation:  Discharge Diet Orders (From admission, onward)     Start     Ordered   09/21/22 0000  Diet - low sodium heart healthy        09/21/22 1107           Regular diet DISCHARGE MEDICATION: Allergies as of 09/21/2022       Reactions   Hydrocodone Nausea And  Vomiting   Propoxyphene N-acetaminophen Nausea And Vomiting        Medication List     STOP taking these medications    Arexvy 120 MCG/0.5ML injection Generic drug: RSV vaccine recomb adjuvanted   aspirin EC 81 MG tablet Commonly known as: Aspirin Adult Low Strength   gabapentin 100 MG capsule Commonly known as: NEURONTIN   meloxicam 7.5 MG tablet Commonly known as: MOBIC   predniSONE 10 MG tablet Commonly known as: DELTASONE       TAKE these medications    acetaminophen 500 MG tablet Commonly known as: TYLENOL Take 1 tablet (500 mg total) by mouth every 4 (four) hours as needed for headache. What changed:  how much to take when to take this   alendronate 70 MG tablet Commonly known as: FOSAMAX Take 1 tablet (70 mg total) by mouth every 7 (seven) days. Take with a full glass of water on an empty stomach. What changed: when to take this   atorvastatin 40 MG tablet Commonly known as: LIPITOR TAKE 1 TABLET EVERY DAY   azithromycin 250 MG tablet Commonly known as: ZITHROMAX Take 1 tablet (250 mg total) by mouth daily.   benzonatate 200 MG capsule Commonly known as: TESSALON Take 1 capsule (200 mg total) by mouth 3 (three) times daily as needed for cough.   bisacodyl 5 MG EC tablet Generic drug: bisacodyl Take 2 tablets (10 mg total) by  mouth daily as needed for moderate constipation.   bismuth subsalicylate 621 HY/86VH suspension Commonly known as: PEPTO BISMOL Take 30 mLs by mouth every 6 (six) hours as needed for indigestion.   Breo Ellipta 200-25 MCG/ACT Aepb Generic drug: fluticasone furoate-vilanterol Inhale 1 puff into the lungs daily.   cyanocobalamin 1000 MCG tablet Commonly known as: VITAMIN B12 Take 1 tablet (1,000 mcg total) by mouth daily.   FeroSul 325 (65 FE) MG tablet Generic drug: ferrous sulfate Take 1 tablet (325 mg total) by mouth 2 (two) times daily with a meal. What changed: when to take this   fluticasone 50 MCG/ACT nasal  spray Commonly known as: FLONASE USE 2 SPRAYS IN EACH NOSTRIL EVERY DAY What changed:  how much to take how to take this when to take this reasons to take this additional instructions   ipratropium-albuterol 0.5-2.5 (3) MG/3ML Soln Commonly known as: DUONEB INHALE THE CONTENTS OF 1 VIAL VIA NEBULIZER EVERY 6 HOURS AS NEEDED What changed: See the new instructions.   loratadine 10 MG tablet Commonly known as: CLARITIN Take 1 tablet (10 mg total) by mouth daily.   methocarbamol 500 MG tablet Commonly known as: ROBAXIN Take 1 tablet (500 mg total) by mouth every 8 (eight) hours as needed for muscle spasms.   mupirocin ointment 2 % Commonly known as: BACTROBAN Apply 1 Application topically 2 (two) times daily. To affected nostril What changed:  how to take this when to take this additional instructions   OXYGEN Inhale 3 L/min into the lungs continuous.   polyethylene glycol powder 17 GM/SCOOP powder Commonly known as: GLYCOLAX/MIRALAX Take 17 g by mouth daily as directed.   senna-docusate 8.6-50 MG tablet Commonly known as: Senokot-S Take 2 tablets by mouth 2 (two) times daily.   Spiriva Respimat 2.5 MCG/ACT Aers Generic drug: Tiotropium Bromide Monohydrate INHALE 2 PUFFS EVERY DAY What changed: See the new instructions.   terbinafine 250 MG tablet Commonly known as: LAMISIL Take 1 tablet (250 mg total) by mouth daily.   traMADol 50 MG tablet Commonly known as: ULTRAM Take 50 mg by mouth every 6 (six) hours as needed (for pain).   Vitamin D (Ergocalciferol) 1.25 MG (50000 UNIT) Caps capsule Commonly known as: DRISDOL Take 1 capsule (50,000 Units total) by mouth every 7 (seven) days. What changed: when to take this   Vitamin D3 25 MCG (1000 UT) Caps Take 1,000 Units by mouth daily.        Follow-up Information     [name], MD. Schedule an appointment as soon as possible for a visit in 1 week(s).   Specialty: Family Medicine Contact  information: [place] [address] [phone]                Discharge Exam: Danley Danker Weights   09/15/22 1332  Weight: 51.7 kg   General exam: Appears calm and comfortable  Respiratory system: Clear to auscultation. Respiratory effort normal. Cardiovascular system: S1 & S2 heard, RRR. No JVD,  Gastrointestinal system: Abdomen is nondistended, soft and nontender. d. No focal neurological deficits. Extremities: Symmetric 5 x 5 power. Skin: No rashes,  Psychiatry: Mood & affect appropriate.    Condition at discharge: fair  The results of significant diagnostics from this hospitalization (including imaging, microbiology, ancillary and laboratory) are listed below for reference.   Imaging Studies: ECHOCARDIOGRAM COMPLETE  Result Date: 09/16/2022    ECHOCARDIOGRAM REPORT   Patient Name:   SESILIA POUCHER Date of Exam: 09/16/2022  Medical Rec #:  [id number]      Height:       64.0 in Accession #:    [id number]     Weight:       114.0 lb Date of Birth:  28-Mar-1951      BSA:          1.541 m Patient Age:    71 years       BP:           117/54 mmHg Patient Gender: F              HR:           93 bpm. Exam Location:  Inpatient Procedure: 2D Echo, Cardiac Doppler and Color Doppler Indications:    Acute respiratory distress  History:        Patient has prior history of Echocardiogram examinations, most                 recent 06/14/2021. COPD, Signs/Symptoms:Dyspnea and Shortness of                 Breath; Risk Factors:Dyslipidemia and Former Smoker. Chronic                 respiratory failure, hx of cancer.  Sonographer:    Eartha Inch Referring Phys: [id number] Todd  Sonographer Comments: Technically difficult study due to poor echo windows. Image acquisition challenging due to patient body habitus, Image acquisition challenging due to respiratory motion and Image acquisition challenging due to COPD. IMPRESSIONS  1. Left ventricular ejection fraction, by  estimation, is 60 to 65%. The left ventricle has normal function. The left ventricle has no regional wall motion abnormalities. Left ventricular diastolic parameters are consistent with Grade I diastolic dysfunction (impaired relaxation).  2. Right ventricular systolic function is normal. The right ventricular size is normal. There is normal pulmonary artery systolic pressure. The estimated right ventricular systolic pressure is 30.8 mmHg.  3. The mitral valve is grossly normal. Trivial mitral valve regurgitation.  4. The aortic valve is tricuspid. There is mild calcification of the aortic valve. There is mild thickening of the aortic valve. Aortic valve regurgitation is mild to moderate. Aortic valve sclerosis/calcification is present, without any evidence of aortic stenosis.  5. The inferior vena cava is normal in size with greater than 50% respiratory variability, suggesting right atrial pressure of 3 mmHg. Comparison(s): No significant change from prior study. FINDINGS  Left Ventricle: Left ventricular ejection fraction, by estimation, is 60 to 65%. The left ventricle has normal function. The left ventricle has no regional wall motion abnormalities. The left ventricular internal cavity size was normal in size. There is  no left ventricular hypertrophy. Left ventricular diastolic parameters are consistent with Grade I diastolic dysfunction (impaired relaxation). Right Ventricle: The right ventricular size is normal. No increase in right ventricular wall thickness. Right ventricular systolic function is normal. There is normal pulmonary artery systolic pressure. The tricuspid regurgitant velocity is 2.57 m/s, and  with an assumed right atrial pressure of 3 mmHg, the estimated right ventricular systolic pressure is 65.7 mmHg. Left Atrium: Left atrial size was normal in size. Right Atrium: Right atrial size was normal in size. Pericardium: There is no evidence of pericardial effusion. Mitral Valve: The mitral valve  is grossly normal. There is mild thickening of the mitral valve leaflet(s). There is mild calcification of the mitral valve leaflet(s). Trivial mitral valve regurgitation. Tricuspid Valve: The tricuspid valve is normal in structure. Tricuspid  valve regurgitation is mild. Aortic Valve: The aortic valve is tricuspid. There is mild calcification of the aortic valve. There is mild thickening of the aortic valve. Aortic valve regurgitation is mild to moderate. Aortic valve sclerosis/calcification is present, without any evidence of aortic stenosis. Pulmonic Valve: The pulmonic valve was not well visualized. Aorta: The aortic root and ascending aorta are structurally normal, with no evidence of dilitation. Venous: The inferior vena cava is normal in size with greater than 50% respiratory variability, suggesting right atrial pressure of 3 mmHg. IAS/Shunts: The atrial septum is grossly normal.  LEFT VENTRICLE PLAX 2D LVIDd:         3.40 cm     Diastology LVIDs:         2.60 cm     LV e' medial:    7.51 cm/s LV PW:         0.80 cm     LV E/e' medial:  7.8 LV IVS:        1.00 cm     LV e' lateral:   6.42 cm/s LVOT diam:     2.00 cm     LV E/e' lateral: 9.2 LV SV:         70 LV SV Index:   45 LVOT Area:     3.14 cm  LV Volumes (MOD) LV vol d, MOD A2C: 63.7 ml LV vol d, MOD A4C: 55.2 ml LV vol s, MOD A2C: 20.0 ml LV vol s, MOD A4C: 20.7 ml LV SV MOD A2C:     43.7 ml LV SV MOD A4C:     55.2 ml LV SV MOD BP:      40.0 ml RIGHT VENTRICLE             IVC RV S prime:     14.90 cm/s  IVC diam: 1.80 cm TAPSE (M-mode): 1.4 cm LEFT ATRIUM             Index        RIGHT ATRIUM          Index LA diam:        2.70 cm 1.75 cm/m   RA Area:     8.27 cm LA Vol (A2C):   26.9 ml 17.46 ml/m  RA Volume:   14.40 ml 9.35 ml/m LA Vol (A4C):   16.6 ml 10.78 ml/m LA Biplane Vol: 21.9 ml 14.22 ml/m  AORTIC VALVE LVOT Vmax:   121.00 cm/s LVOT Vmean:  80.600 cm/s LVOT VTI:    0.223 m  AORTA Ao Root diam: 2.70 cm Ao Asc diam:  3.40 cm MITRAL VALVE                 TRICUSPID VALVE MV Area (PHT): 2.09 cm     TR Peak grad:   26.4 mmHg MV Decel Time: 363 msec     TR Vmax:        257.00 cm/s MV E velocity: 58.80 cm/s MV A velocity: 107.00 cm/s  SHUNTS MV E/A ratio:  0.55         Systemic VTI:  0.22 m                             Systemic Diam: 2.00 cm Gwyndolyn Kaufman MD Electronically signed by Gwyndolyn Kaufman MD Signature Date/Time: 09/16/2022/3:42:32 PM    Final    DG Chest 2 View  Result Date: 09/14/2022 CLINICAL DATA:  Shortness of breath. Recently  hospitalized for COPD exacerbation. EXAM: CHEST - 2 VIEW COMPARISON:  AP chest 08/17/2022, CT chest 07/27/2022 FINDINGS: Cardiac silhouette and mediastinal contours are within normal limits. Moderate to high-grade atherosclerotic calcifications within the aorta. Flattening of the diaphragms and moderate hyperinflation. 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 No focal airspace opacity. No pulmonary edema, pleural effusion, or pneumothorax. Mild-to-moderate multilevel degenerative disc changes of the thoracic spine. IMPRESSION: 1. No acute cardiopulmonary process. 2. Moderate hyperinflation and findings consistent with COPD. Electronically Signed   By: Yvonne Kendall M.D.   On: 09/14/2022 18:06    Microbiology: Results for orders placed or performed during the hospital encounter of 09/14/22  Resp panel by RT-PCR (RSV, Flu A&B, Covid) Anterior Nasal Swab     Status: None   Collection Time: 09/14/22  5:22 PM   Specimen: Anterior Nasal Swab  Result Value Ref Range Status   SARS Coronavirus 2 by RT PCR NEGATIVE NEGATIVE Final    Comment: (NOTE) SARS-CoV-2 target nucleic acids are NOT DETECTED.  The SARS-CoV-2 RNA is generally detectable in upper respiratory specimens during the acute phase of infection. The lowest concentration of SARS-CoV-2 viral copies this assay can  detect is 138 copies/mL. A negative result does not preclude SARS-Cov-2 infection and should not be used as the sole basis for treatment or other patient management decisions. A negative result may occur with  improper specimen collection/handling, submission of specimen other than nasopharyngeal swab, presence of viral mutation(s) within the areas targeted by this assay, and inadequate number of viral copies(<138 copies/mL). A negative result must be combined with clinical observations, patient history, and epidemiological information. The expected result is Negative.  Fact Sheet for Patients:  EntrepreneurPulse.com.au  Fact Sheet for Healthcare Providers:  IncredibleEmployment.be  This test is no t yet approved or cleared by the [place] FDA and  has been authorized for detection and/or diagnosis of SARS-CoV-2 by FDA under an Emergency Use Authorization (EUA). This EUA will remain  in effect (meaning this test can be used) for the duration of the COVID-19 declaration under Section 564(b)(1) of the Act, 21 U.S.C.section 360bbb-3(b)(1), unless the authorization is terminated  or revoked sooner.       Influenza A by PCR NEGATIVE NEGATIVE Final   Influenza B by PCR NEGATIVE NEGATIVE Final    Comment: (NOTE) The Xpert Xpress SARS-CoV-2/FLU/RSV plus assay is intended as an aid in the diagnosis of influenza from Nasopharyngeal swab specimens and should not be used as a sole basis for treatment. Nasal washings and aspirates are unacceptable for Xpert Xpress SARS-CoV-2/FLU/RSV testing.  Fact Sheet for Patients: EntrepreneurPulse.com.au  Fact Sheet for Healthcare Providers: IncredibleEmployment.be  This test is not yet approved or cleared by the [place] FDA and has been authorized for detection and/or diagnosis of SARS-CoV-2 by FDA under an Emergency Use Authorization (EUA). This EUA will remain in  effect (meaning this test can be used) for the duration of the COVID-19 declaration under Section 564(b)(1) of the Act, 21 U.S.C. section 360bbb-3(b)(1), unless the authorization is terminated or revoked.     Resp Syncytial Virus by PCR NEGATIVE NEGATIVE Final    Comment: (NOTE) Fact Sheet for Patients: EntrepreneurPulse.com.au  Fact Sheet for Healthcare Providers: IncredibleEmployment.be  This test is not yet approved or cleared by the [place] FDA and has been authorized for detection and/or diagnosis of SARS-CoV-2 by FDA under an Emergency Use Authorization (EUA).  This EUA will remain in effect (meaning this test can be used) for the duration of the COVID-19 declaration under Section 564(b)(1) of the Act, 21 U.S.C. section 360bbb-3(b)(1), unless the authorization is terminated or revoked.  Performed at [facility], [place] [address], [place], [place] [id number]     Labs: CBC: Recent Labs  Lab 09/16/22 0528 09/17/22 0518  WBC 13.3* 8.6  NEUTROABS 10.5*  --   HGB 9.8* 9.7*  HCT 30.7* 31.4*  MCV 100.0 102.3*  PLT 255 103   Basic Metabolic Panel: Recent Labs  Lab 09/16/22 0528 09/17/22 0518  NA 142 142  K 3.7 3.7  CL 110 109  CO2 27 28  GLUCOSE 97 80  BUN 23 21  CREATININE 0.59 0.56  CALCIUM 9.0 8.9   Liver Function Tests: No results for input(s): "AST", "ALT", "ALKPHOS", "BILITOT", "PROT", "ALBUMIN" in the last 168 hours. CBG: No results for input(s): "GLUCAP" in the last 168 hours.  Discharge time spent: 38 minutes.   Signed: Hosie Poisson, MD [facility] 09/22/2022

## 2022-09-24 ENCOUNTER — Telehealth: Payer: Self-pay

## 2022-09-24 NOTE — Patient Outreach (Signed)
  Care Coordination TOC Note Transition Care Management Follow-up Telephone Call Date of discharge and from where: 09/21/22-[facility]  Dx: "COPD exacerbation" How have you been since you were released from the hospital? Patient voices that her breathing has been managed-under control. She is using oxygen cont a 3L/min. and taking resp meds as ordered.  Any questions or concerns? Yes-Patient states she had some constipation in the hospital and was given some meds to help her have a BM prior to discharge. Now she is going to the bathroom about 2-3x/day and stools are loose. She voices that she is staying hydrated. She has Pepto-Bismol and will try taking some of that today and if no improvement in 24hrs will call MD.   Items Reviewed: Did the pt receive and understand the discharge instructions provided? Yes  Medications obtained and verified? Yes  Other? Yes  Any new allergies since your discharge? No  Dietary orders reviewed? Yes Do you have support at home? Yes   Home Care and Equipment/Supplies: Were home health services ordered? yes If so, what is the name of the agency? [facility]  Has the agency set up a time to come to the patient's home? No-Discussed to follow up with agency if no call from them within 48-72hrs post-discharge. Confirmed that they have agency contact info.  Were any new equipment or medical supplies ordered?  No What is the name of the medical supply agency? N/A Were you able to get the supplies/equipment? not applicable Do you have any questions related to the use of the equipment or supplies? No  Functional Questionnaire: (I = Independent and D = Dependent) ADLs: I  Bathing/Dressing- I  Meal Prep- I  Eating- I  Maintaining continence- I  Transferring/Ambulation- I  Managing Meds- I  Follow up appointments reviewed:  PCP Hospital f/u appt confirmed? Yes  Scheduled to see Dr. Margarita Rana on 10/04/22 @ 2:30pm. [facility] f/u appt  confirmed? Yes  Scheduled to see Dr. Silverio Lay on 09/27/22 @ 11am. Are transportation arrangements needed? No  If their condition worsens, is the pt aware to call PCP or go to the Emergency Dept.? Yes Was the patient provided with contact information for the PCP's office or ED? Yes Was to pt encouraged to call back with questions or concerns? Yes  SDOH assessments and interventions completed:   Yes SDOH Interventions Today    Flowsheet Row Most Recent Value  SDOH Interventions   Food Insecurity Interventions Intervention Not Indicated  Transportation Interventions Intervention Not Indicated       Care Coordination Interventions:  Education provided    Encounter Outcome:  Pt. Visit Completed     Enzo Montgomery, RN,BSN,CCM Home Management Telephonic Care Management Coordinator Direct Phone: [phone] Toll Free: [phone] Fax: [phone]

## 2022-09-26 ENCOUNTER — Telehealth: Payer: Self-pay | Admitting: Family Medicine

## 2022-09-26 NOTE — Telephone Encounter (Unsigned)
Copied from [place] [id number]. Topic: Quick Communication - Home Health Verbal Orders >> Sep 26, 2022 10:48 AM Cyndi Bender wrote: Caller/Agency: Costella Hatcher with [facility] Number: [phone] Requesting OT/PT/Skilled Nursing/Social Work/Speech Therapy: OT and Home Health Aid Frequency: 1 x a month for 2 months

## 2022-09-26 NOTE — Progress Notes (Unsigned)
09/27/2022 Margaret Henry [id number] [birth year]  Referring provider: [name], MD Primary GI doctor: Dr. 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 Johney Maine recall 5 years presents with positive Cologuard and anemia.    Anemia with history of B12 and iron deficiency. 03/2022 symptomatic anemia hospitalization, negative fecal occult blood at that time, large hematoma on lateral hip, severely B12 deficient. Her most recent iron and ferritin showed iron of 43 and ferritin 44 she is on iron every other day.  Her MCV was still elevated at 102.   She is on B12 pill supplements. No overt GI bleeding I am not surprised that patient had positive Cologuard with hemorrhoids and previously known AIN 3 and polyps. Currently it appears the patient does not have an iron deficiency, may still have B12 or folate def with elevated MCV. Negative Hemoccult in the office today which is reassuring and previous negative Hemoccult in the ER.  Long discussion with the patient in regards to the positive Cologuard and the meaning, can be false positive the majority of the time, can mean advanced polyp, can due to blood, and low percentage of colon cancer however with her current severe COPD it is not advisable for the patient to have endoscopic evaluation, she is very high risk for needing intubation with any procedure and would be difficult to extubate.  Discussed possibility of virtual colonoscopy however if a polyp is seen the question would become if we still going to do a colonoscopy or what would we do about it.  She is not a surgical candidate. Patient agrees to not proceed EGD or colon at this time.  Can consider CT AB and pelvis Can recheck labs for iron deficiency, refer to hematology for supportive care,  patient may need B12 sublingual or shot if she is not absorbing it well.  GERD Known reflux, on chronic prednisone and Fosamax. Will add low-dose pantoprazole 20 mg daily for supportive care.  Chronic respiratory failure with hypoxia (HCC) being evaluated by for lung nodule Continue follow up with pulmonary  Severe protein-calorie malnutrition ([place]) Could be contributing to her anemia Will refer to hematology for supportive care.  Gallstones Patient has known cholelithiasis without inflammation on CT lung Advised low fat diet.  monitor   In general patient is a very poor surgical and endoscopic candidate.  And patient agrees with this. Will continue supportive care. Another discussion of goals of care, had palliative care consult but states they will not see her unless she is hospice. Continue to have goals of care conversation.    Patient Care Team: [name], MD as PCP - General (Family Medicine) Dorothy Spark, MD as PCP - Cardiology (Cardiology) Michael Boston, MD as Consulting Physician (General Surgery) Pyrtle, Lajuan Lines, MD as Consulting Physician (Gastroenterology) Marshell Garfinkel, MD as Consulting Physician (Pulmonary Disease)  HISTORY OF PRESENT ILLNESS: 71 y.o. female with a past medical history of advanced COPD oxygen and steroid-dependent, daily azithromycin, chronic respiratory failure with hypoxia on 3 L O2, compression fractures, diverticulosis, GERD, hyperlipidemia, mild AI, coronary artery disease and others listed below presents for evaluation of IDA and positive Cologuard 07/12/2022.  10/11/2015 colonoscopy with Dr. Hilarie Fredrickson for screening purposes, first and only colonoscopy, showed TI normal, sessile polyp cecum, mild diverticulosis, anal canal polyp versus skin tag.  Anal polyp with AIN-3 referred  to Dr. Johney Maine.   Recall 5 years. 12/14/2015 Dr. Johney Maine rectal exam under anesthesia removal of anal canal mass with internal and external hemorrhoid  ligation.  04/06/2022 Patient hospitalized for symptomatic anemia had HGB 6.1, iron 8, saturation ratios 2, ferritin 6, down from baseline of 9, FOBT negative in the ER transfused 2 units.   Patient had no elevation of BUN.  B12 170, folate 16. Right hip/pelvis lateral soft tissue swelling 6 x 5 cm soft tissue question hematoma versus mass for possible acute anemia.   On aspirin no anticoagulants. Since June patient has had 5 hospital admissions the majority for COPD exacerbations. 06/07/2022 patient had palliative care encounter, I do not see any follow-up encounters. 07/05/2022 pulmonary office visit with Dr. Vaughan Browner 07/2022 positive cologuard 09/17/2022 iron 43, saturation ratio 17, ferritin 44- no iron def. 09/17/2022 hemoglobin 9.7, MCV 102, normal kidney.  She is on B12 pill, has not had a recheck.  She is on iron every other day due to constipation.  She denies see any blood in stool, very rare BRB on TP, never with stool or in stool.  She has had constipation, most recent hospitilization has been was 4 days before BM. She then got home and had severe diarrhea. Had some fecal incontinence with this.  Her last BM was yesterday afternoon, loose.  She was getting miralax, senokot in the hospital, nothing outpatient. No fiber supplement.  She has GERD, no nausea, vomiting.  She has AB cramping in BM, better after BM.   She is on prednisone 10 mg daily, she is on fosamax.  Trying to wean off prednisone, has appointment Jan 10th to try to get off with Dr. Vaughan Browner  She  reports that she quit smoking about 3 years ago. Her smoking use included cigarettes. She has a 75.00 pack-year smoking history. She has never used smokeless tobacco. She reports that she does not drink alcohol and does not use drugs.  Current Medications:   Current Outpatient Medications (Endocrine & Metabolic):    alendronate (FOSAMAX) 70 MG tablet, Take 1 tablet (70 mg total) by mouth every 7 (seven) days. Take with a  full glass of water on an empty stomach. (Patient taking differently: Take 70 mg by mouth every Sunday. Take with a full glass of water on an empty stomach.)  Current Outpatient Medications (Cardiovascular):    atorvastatin (LIPITOR) 40 MG tablet, TAKE 1 TABLET EVERY DAY (Patient taking differently: Take 40 mg by mouth daily.)  Current Outpatient Medications (Respiratory):    benzonatate (TESSALON) 200 MG capsule, Take 1 capsule (200 mg total) by mouth 3 (three) times daily as needed for cough.   BREO ELLIPTA 200-25 MCG/ACT AEPB, Inhale 1 puff into the lungs daily.   fluticasone (FLONASE) 50 MCG/ACT nasal spray, USE 2 SPRAYS IN EACH NOSTRIL EVERY DAY (Patient taking differently: Place 2 sprays into both nostrils daily as needed for allergies.)   ipratropium-albuterol (DUONEB) 0.5-2.5 (3) MG/3ML SOLN, INHALE THE CONTENTS OF 1 VIAL VIA NEBULIZER EVERY 6 HOURS AS NEEDED (Patient taking differently: Take 3 mLs by nebulization 3 (three) times daily.)   loratadine (CLARITIN) 10 MG tablet, Take 1 tablet (10 mg total) by mouth daily.   Tiotropium Bromide Monohydrate (SPIRIVA RESPIMAT) 2.5 MCG/ACT AERS, INHALE 2 PUFFS EVERY DAY (Patient taking differently: Take 2 each by mouth daily.)  Current Outpatient Medications (Analgesics):    acetaminophen (TYLENOL) 500 MG tablet, Take 1 tablet (500 mg total) by mouth every 4 (four) hours as needed for headache. (Patient  taking differently: Take 1,500 mg by mouth at bedtime.)   traMADol (ULTRAM) 50 MG tablet, Take 50 mg by mouth every 6 (six) hours as needed (for pain).  Current Outpatient Medications (Hematological):    cyanocobalamin (VITAMIN B12) 1000 MCG tablet, Take 1 tablet (1,000 mcg total) by mouth daily.   ferrous sulfate 325 (65 FE) MG tablet, Take 1 tablet (325 mg total) by mouth 2 (two) times daily with a meal. (Patient taking differently: Take 325 mg by mouth daily with breakfast.)  Current Outpatient Medications (Other):    azithromycin (ZITHROMAX)  250 MG tablet, Take 1 tablet (250 mg total) by mouth daily.   bisacodyl (DULCOLAX) 5 MG EC tablet, Take 2 tablets (10 mg total) by mouth daily as needed for moderate constipation.   bismuth subsalicylate (PEPTO BISMOL) 262 MG/15ML suspension, Take 30 mLs by mouth every 6 (six) hours as needed for indigestion.   Cholecalciferol (VITAMIN D3) 25 MCG (1000 UT) CAPS, Take 1,000 Units by mouth daily.   mupirocin ointment (BACTROBAN) 2 %, Apply 1 Application topically 2 (two) times daily. To affected nostril (Patient taking differently: Place 1 Application into the nose See admin instructions. Apply to affected (inner) nostril(s) one to two times a day)   OXYGEN, Inhale 3 L/min into the lungs continuous.   pantoprazole (PROTONIX) 20 MG tablet, Take 1 tablet (20 mg total) by mouth daily.   polyethylene glycol powder (GLYCOLAX/MIRALAX) 17 GM/SCOOP powder, Take 17 g by mouth daily as directed.   senna-docusate (SENOKOT-S) 8.6-50 MG tablet, Take 2 tablets by mouth 2 (two) times daily.   terbinafine (LAMISIL) 250 MG tablet, Take 1 tablet (250 mg total) by mouth daily.   Vitamin D, Ergocalciferol, (DRISDOL) 1.25 MG (50000 UNIT) CAPS capsule, Take 1 capsule (50,000 Units total) by mouth every 7 (seven) days. (Patient taking differently: Take 50,000 Units by mouth every Sunday.)   methocarbamol (ROBAXIN) 500 MG tablet, Take 1 tablet (500 mg total) by mouth every 8 (eight) hours as needed for muscle spasms.  Medical History:  Past Medical History:  Diagnosis Date   Allergy    hayfever   Anemia 04/06/22   Blood transfusion without reported diagnosis 6/23   Bronchitis    Cancer ([place])    skin cancer on chest   Cataract 5/23   Chest pain 06/12/2016   Colon polyps    Complication of anesthesia    pt states was given too much during nasal surgery 1989; difficulty getting awake   Complication of anesthesia 05/16/2021   pt states was given too much during nasal surgery 1989; difficulty getting awake   COPD  (chronic obstructive pulmonary disease) ([place])    COPD exacerbation ([place]) 06/01/2013   Coronary artery calcification    Diverticulitis    Diverticulitis 05/16/2021   occasional   Dyspnea 06/27/2016   Emphysema of lung (HCC)    GERD (gastroesophageal reflux disease)    occasional   Hemorrhoids    High cholesterol    Hyperlipidemia LDL goal <70    Mild aortic insufficiency    Osteoporosis    Oxygen deficiency    Pneumonia    Shortness of breath dyspnea    with exertion    Thyroid goiter    bx benign   Tobacco abuse    Vertigo    Vertigo 04/21/2017   Allergies:  Allergies  Allergen Reactions   Hydrocodone Nausea And Vomiting   Propoxyphene N-Acetaminophen Nausea And Vomiting     Surgical History:  She  has a past  surgical history that includes Abdominal hysterectomy; Partial hysterectomy (10/08/1974); Bilateral oophorectomy (10/09/1999); Nasal septum surgery; Biopsy thyroid; Wisdom tooth extraction; Dental surgery; Tubal ligation; Rectal exam under anesthesia (N/A, 12/14/2015); and Appendectomy (1973). Family History:  Her family history includes Alcohol abuse in her mother; Asthma in her mother; COPD in her father; Cancer in her maternal grandfather and paternal grandmother; Diabetes in her paternal aunt; Heart disease in her father and mother; Liver cancer in her paternal grandmother; Lung cancer in her maternal grandfather.  REVIEW OF SYSTEMS  : All other systems reviewed and negative except where noted in the History of Present Illness.  PHYSICAL EXAM: BP (!) 116/58   Pulse 96   Ht '5\' 4"'$  (1.626 m)   Wt 114 lb (51.7 kg)   BMI 19.57 kg/m  General: Chronically ill-appearing female in wheelchair on 3 L [place]. Head:   Normocephalic and atraumatic. Eyes:  sclerae anicteric,conjunctive pink  Heart:   Regular rate and rhythm, systolic murmur, decreased heart sounds Pulm: Diffusely decreased breath sounds, with expiratory wheezing, possible rhonchi right lower lobe, on 3 L nasal  cannula Abdomen:   Soft, Obese AB, Active bowel sounds. mild tenderness in the RUQ. Without guarding and Without rebound, No organomegaly appreciated. Rectal: Normal external rectal exam, normal to decreased rectal tone, no internal hemorrhoids appreciated, no masses, non tender, dark brown stool, hemoccult Negative Extremities:  Without edema. Msk: Patient in wheelchair. Neurologic:  Alert and  oriented x4;  No focal deficits.  Skin:   Ecchymoses bilateral arms Psychiatric:  Cooperative. Normal mood and affect.  RELEVANT LABS AND IMAGING: CBC    Component Value Date/Time   WBC 8.6 09/17/2022 0518   RBC 3.07 (L) 09/17/2022 0518   HGB 9.7 (L) 09/17/2022 0518   HGB 11.5 04/17/2022 1552   HCT 31.4 (L) 09/17/2022 0518   HCT 38.7 04/17/2022 1552   PLT 217 09/17/2022 0518   PLT 423 04/17/2022 1552   MCV 102.3 (H) 09/17/2022 0518   MCV 85 04/17/2022 1552   MCH 31.6 09/17/2022 0518   MCHC 30.9 09/17/2022 0518   RDW 13.4 09/17/2022 0518   RDW 25.6 (H) 04/17/2022 1552   LYMPHSABS 1.3 09/16/2022 0528   LYMPHSABS 0.7 04/17/2022 1552   MONOABS 1.4 (H) 09/16/2022 0528   EOSABS 0.0 09/16/2022 0528   EOSABS 0.0 04/17/2022 1552   BASOSABS 0.0 09/16/2022 0528   BASOSABS 0.1 04/17/2022 1552    CMP     Component Value Date/Time   NA 142 09/17/2022 0518   NA 144 07/04/2022 1636   K 3.7 09/17/2022 0518   CL 109 09/17/2022 0518   CO2 28 09/17/2022 0518   GLUCOSE 80 09/17/2022 0518   BUN 21 09/17/2022 0518   BUN 12 07/04/2022 1636   CREATININE 0.56 09/17/2022 0518   CREATININE 0.60 09/05/2016 1218   CALCIUM 8.9 09/17/2022 0518   PROT 6.3 (L) 08/18/2022 0417   PROT 6.6 07/04/2022 1636   ALBUMIN 3.3 (L) 08/18/2022 0417   ALBUMIN 4.3 07/04/2022 1636   AST 15 08/18/2022 0417   ALT 11 08/18/2022 0417   ALKPHOS 89 08/18/2022 0417   BILITOT 0.7 08/18/2022 0417   BILITOT <0.2 07/04/2022 1636   GFRNONAA >60 09/17/2022 0518   GFRNONAA >89 03/26/2016 1022   GFRAA 109 11/18/2020 1201    GFRAA >89 03/26/2016 [place] [place], PA-C 12:15 PM

## 2022-09-27 ENCOUNTER — Encounter: Payer: Self-pay | Admitting: Physician Assistant

## 2022-09-27 ENCOUNTER — Other Ambulatory Visit (HOSPITAL_BASED_OUTPATIENT_CLINIC_OR_DEPARTMENT_OTHER): Payer: Self-pay

## 2022-09-27 ENCOUNTER — Ambulatory Visit (INDEPENDENT_AMBULATORY_CARE_PROVIDER_SITE_OTHER): Payer: Medicare HMO | Admitting: Physician Assistant

## 2022-09-27 ENCOUNTER — Other Ambulatory Visit (INDEPENDENT_AMBULATORY_CARE_PROVIDER_SITE_OTHER): Payer: Medicare HMO

## 2022-09-27 VITALS — BP 116/58 | HR 96 | Ht 64.0 in | Wt 114.0 lb

## 2022-09-27 DIAGNOSIS — D509 Iron deficiency anemia, unspecified: Secondary | ICD-10-CM | POA: Diagnosis not present

## 2022-09-27 DIAGNOSIS — D519 Vitamin B12 deficiency anemia, unspecified: Secondary | ICD-10-CM

## 2022-09-27 DIAGNOSIS — J9611 Chronic respiratory failure with hypoxia: Secondary | ICD-10-CM | POA: Diagnosis not present

## 2022-09-27 DIAGNOSIS — R911 Solitary pulmonary nodule: Secondary | ICD-10-CM | POA: Diagnosis not present

## 2022-09-27 DIAGNOSIS — C801 Malignant (primary) neoplasm, unspecified: Secondary | ICD-10-CM

## 2022-09-27 DIAGNOSIS — E43 Unspecified severe protein-calorie malnutrition: Secondary | ICD-10-CM | POA: Diagnosis not present

## 2022-09-27 LAB — IBC + FERRITIN
Ferritin: 54.3 ng/mL (ref 10.0–291.0)
Iron: 57 ug/dL (ref 42–145)
Saturation Ratios: 18.4 % — ABNORMAL LOW (ref 20.0–50.0)
TIBC: 309.4 ug/dL (ref 250.0–450.0)
Transferrin: 221 mg/dL (ref 212.0–360.0)

## 2022-09-27 LAB — CBC WITH DIFFERENTIAL/PLATELET
Basophils Absolute: 0.1 10*3/uL (ref 0.0–0.1)
Basophils Relative: 1.2 % (ref 0.0–3.0)
Eosinophils Absolute: 0.2 10*3/uL (ref 0.0–0.7)
Eosinophils Relative: 2.6 % (ref 0.0–5.0)
HCT: 34 % — ABNORMAL LOW (ref 36.0–46.0)
Hemoglobin: 11.3 g/dL — ABNORMAL LOW (ref 12.0–15.0)
Lymphocytes Relative: 17.1 % (ref 12.0–46.0)
Lymphs Abs: 1.3 10*3/uL (ref 0.7–4.0)
MCHC: 33.3 g/dL (ref 30.0–36.0)
MCV: 97 fl (ref 78.0–100.0)
Monocytes Absolute: 0.9 10*3/uL (ref 0.1–1.0)
Monocytes Relative: 11 % (ref 3.0–12.0)
Neutro Abs: 5.3 10*3/uL (ref 1.4–7.7)
Neutrophils Relative %: 68.1 % (ref 43.0–77.0)
Platelets: 304 10*3/uL (ref 150.0–400.0)
RBC: 3.51 Mil/uL — ABNORMAL LOW (ref 3.87–5.11)
RDW: 14.2 % (ref 11.5–15.5)
WBC: 7.8 10*3/uL (ref 4.0–10.5)

## 2022-09-27 LAB — COMPREHENSIVE METABOLIC PANEL
ALT: 9 U/L (ref 0–35)
AST: 12 U/L (ref 0–37)
Albumin: 4.1 g/dL (ref 3.5–5.2)
Alkaline Phosphatase: 139 U/L — ABNORMAL HIGH (ref 39–117)
BUN: 11 mg/dL (ref 6–23)
CO2: 28 mEq/L (ref 19–32)
Calcium: 9.6 mg/dL (ref 8.4–10.5)
Chloride: 107 mEq/L (ref 96–112)
Creatinine, Ser: 0.5 mg/dL (ref 0.40–1.20)
GFR: 93.75 mL/min (ref >60.00)
Glucose, Bld: 88 mg/dL (ref 70–99)
Potassium: 4 mEq/L (ref 3.5–5.1)
Sodium: 144 mEq/L (ref 135–145)
Total Bilirubin: 0.3 mg/dL (ref 0.2–1.2)
Total Protein: 6.6 g/dL (ref 6.0–8.3)

## 2022-09-27 LAB — FOLATE: Folate: 9.5 ng/mL (ref >5.9)

## 2022-09-27 LAB — VITAMIN B12: Vitamin B-12: 650 pg/mL (ref 211–911)

## 2022-09-27 MED ORDER — PANTOPRAZOLE SODIUM 20 MG PO TBEC
20.0000 mg | DELAYED_RELEASE_TABLET | Freq: Every day | ORAL | 2 refills | Status: DC
  Filled 2022-09-27: qty 90, 90d supply, fill #0

## 2022-09-27 NOTE — Patient Instructions (Addendum)
  Your provider has requested that you go to the basement level for lab work before leaving today. Press "B" on the elevator. The lab is located at the first door on the left as you exit the elevator.   You have been scheduled for an appointment with Dr. Hilarie Fredrickson on 12/04/22 at 9:50 am . Please arrive 10 minutes early for your appointment.   You will get a call from Hematology/ oncology for IV infusion consult.   Miralax is an osmotic laxative.  It only brings more water into the stool.  This is safe to take daily.  Can take up to 17 gram of miralax twice a day.  Mix with juice or coffee.  Start 1 capful at night for 3-4 days and reassess your response in 3-4 days.  You can increase and decrease the dose based on your response.  Remember, it can take up to 3-4 days to take effect OR for the effects to wear off.   I often pair this with benefiber in the morning to help assure the stool is not too loose.   Please take your proton pump inhibitor medication, pantoprazole 20 mg daily  Please take this medication 30 minutes to 1 hour before meals- this makes it more effective.  Avoid spicy and acidic foods Avoid fatty foods Limit your intake of coffee, tea, alcohol, and carbonated drinks Work to maintain a healthy weight Keep the head of the bed elevated at least 3 inches with blocks or a wedge pillow if you are having any nighttime symptoms Stay upright for 2 hours after eating Avoid meals and snacks three to four hours before bedtime

## 2022-09-27 NOTE — Progress Notes (Signed)
Addendum: Reviewed and agree with assessment and management plan. Amarii Bordas M, MD  

## 2022-10-02 ENCOUNTER — Telehealth: Payer: Self-pay | Admitting: Emergency Medicine

## 2022-10-02 ENCOUNTER — Inpatient Hospital Stay: Payer: Medicare HMO | Admitting: Family Medicine

## 2022-10-02 LAB — RETICULOCYTES
ABS Retic: 37620 cells/uL (ref 20000–80000)
Retic Ct Pct: 1.1 %

## 2022-10-02 LAB — METHYLMALONIC ACID, SERUM: Methylmalonic Acid, Quant: 119 nmol/L (ref 87–318)

## 2022-10-02 NOTE — Telephone Encounter (Signed)
Verbal orders given for patient.

## 2022-10-02 NOTE — Telephone Encounter (Signed)
Copied from [place] [id number]. Topic: Quick Communication - Home Health Verbal Orders >> Oct 02, 2022 10:07 AM Leilani Able wrote: Caller/Agency: [facility] Callback Number: [phone] Gracie Requesting/PT Once a week  Frequency: 3 wks

## 2022-10-02 NOTE — Telephone Encounter (Signed)
Copied from [place] [id number]. Topic: Quick Communication - Home Health Verbal Orders >> Oct 02, 2022  1:11 PM Everette C wrote: Caller/Agency: [facility] Number: [phone] Requesting OT/PT/Skilled Nursing/Social Work/Speech Therapy: Home Health Nursing  Frequency: 1w4, 1 week every other week 4

## 2022-10-03 ENCOUNTER — Ambulatory Visit: Payer: Self-pay | Admitting: *Deleted

## 2022-10-03 NOTE — Telephone Encounter (Signed)
  Chief Complaint: low BP and dizziness Symptoms: low BP prior to call 83/46 took nebulizer treatment and now BP 108/60 HR 120 rechecked approx 5 minutes BP 118/70 HR 122 bpm. O2 sat at 93% on O2 at 3L/min [place]. Back pain . PT just arrived for therapy. Reports feeling dizzy and like she could pass out. Chills reported on christmas day . Hx COPD Frequency: today  Pertinent Negatives: Patient denies chest pain no difficulty breathing no fever.  Disposition: '[]'$ ED /'[x]'$ Urgent Care (no appt availability in office) / '[]'$ Appointment(In office/virtual)/ '[]'$  [place] [facility]/ '[]'$ Home Care/ '[]'$ Refused Recommended Disposition /'[]'$ [place] [facility]/ '[]'$  Follow-up with PCP Additional Notes:   Patient just took breathing treatment and reported low BP and elevated HR. Reports she has Hospital fu appt tomorrow. Recommended patient go to UC for evaluation due to sx. Patient reports she does not want to go any where now. PT was coming in to do therapy now. Please advise.     Reason for Disposition  [4] Systolic BP 66-599 AND [3] taking blood pressure medications AND [3] dizzy, lightheaded or weak  Answer Assessment - Initial Assessment Questions 1. BLOOD PRESSURE: "What is the blood pressure?" "Did you take at least two measurements 5 minutes apart?"     BP 108/60 rechecked for BP 118/70  P 122 bpm 2. ONSET: "When did you take your blood pressure?"     Now  3. HOW: "How did you obtain the blood pressure?" (e.g., visiting nurse, automatic home BP monitor)     Home BP monitor  4. HISTORY: "Do you have a history of low blood pressure?" "What is your blood pressure normally?"     No . Hx COPD  5. MEDICINES: "Are you taking any medications for blood pressure?" If Yes, ask: "Have they been changed recently?"     Na  6. PULSE RATE: "Do you know what your pulse rate is?"      120 bpm after taking  7. OTHER SYMPTOMS: "Have you been sick recently?" "Have you had a recent injury?"     Chills at christmas day  8.  PREGNANCY: "Is there any chance you are pregnant?" "When was your last menstrual period?"     Na  Protocols used: Blood Pressure - Low-A-AH

## 2022-10-03 NOTE — Telephone Encounter (Signed)
VM left giving verbal orders for patient.

## 2022-10-03 NOTE — Telephone Encounter (Signed)
Noted  

## 2022-10-04 ENCOUNTER — Encounter: Payer: Self-pay | Admitting: Family Medicine

## 2022-10-04 ENCOUNTER — Other Ambulatory Visit (HOSPITAL_BASED_OUTPATIENT_CLINIC_OR_DEPARTMENT_OTHER): Payer: Self-pay

## 2022-10-04 ENCOUNTER — Other Ambulatory Visit: Payer: Self-pay

## 2022-10-04 ENCOUNTER — Ambulatory Visit: Payer: Medicare HMO | Attending: Family Medicine | Admitting: Family Medicine

## 2022-10-04 VITALS — BP 128/69 | HR 106 | Temp 98.2°F | Wt 113.2 lb

## 2022-10-04 DIAGNOSIS — J9611 Chronic respiratory failure with hypoxia: Secondary | ICD-10-CM

## 2022-10-04 DIAGNOSIS — M545 Low back pain, unspecified: Secondary | ICD-10-CM | POA: Diagnosis not present

## 2022-10-04 DIAGNOSIS — M810 Age-related osteoporosis without current pathological fracture: Secondary | ICD-10-CM

## 2022-10-04 DIAGNOSIS — D649 Anemia, unspecified: Secondary | ICD-10-CM | POA: Diagnosis not present

## 2022-10-04 DIAGNOSIS — J449 Chronic obstructive pulmonary disease, unspecified: Secondary | ICD-10-CM | POA: Diagnosis not present

## 2022-10-04 DIAGNOSIS — D509 Iron deficiency anemia, unspecified: Secondary | ICD-10-CM

## 2022-10-04 DIAGNOSIS — D519 Vitamin B12 deficiency anemia, unspecified: Secondary | ICD-10-CM

## 2022-10-04 MED ORDER — DULOXETINE HCL 60 MG PO CPEP
60.0000 mg | ORAL_CAPSULE | Freq: Every day | ORAL | 3 refills | Status: DC
  Filled 2022-10-04: qty 30, 30d supply, fill #0

## 2022-10-04 MED ORDER — KETOROLAC TROMETHAMINE 60 MG/2ML IM SOLN
60.0000 mg | Freq: Once | INTRAMUSCULAR | Status: AC
  Administered 2022-10-04: 60 mg via INTRAMUSCULAR

## 2022-10-04 NOTE — Patient Instructions (Signed)

## 2022-10-04 NOTE — Progress Notes (Signed)
Subjective:  Patient ID: Margaret Henry, female    DOB: 10-08-1951  Age: 71 y.o. MRN: [id number]  CC: Back Pain   HPI Margaret Henry is a 70 y.o. year old female with a history of COPD, previous tobacco abuse, GERD, hyperlipidemia, chronic respiratory failure (of 3 L of oxygen at rest).  3 weeks ago she had a hospitalization for COPD exacerbation, treated with steroid taper.  Chest x-ray revealed findings in keeping with COPD. She feels back to baseline at the moment. Accompanied by her son to today's visit.  Interval History:  Seen by GI due to iron deficiency anemia.  Notes reviewed reveal due to the fact that she is high risk for surgery, no invasive procedure will be pursued and she will be observed.  Last colonoscopy in 2017 revealed AIN with resection of 3 polyps.  She has an appointment with Ortho for her back next month. Pain is 10/10 at the moment. She is on Tramadol from Ortho and also wears a back brace Her gabapentin was discontinued as well as Robaxin during her hospitalization.  She states those medications make her ' sick in her stomach.'  Yesterday she was dizzy and BP was 90/50, 83/46 and then a few minutes later during her physical therapy appointment blood pressure had returned back to her usual of low 211 systolic.  She states sometimes with her vertigo she could feel dizzy and blood pressure could be low.  She is not on an antihypertensive.  She feels normal with no dizziness. Past Medical History:  Diagnosis Date   Allergy    hayfever   Anemia 04/06/22   Blood transfusion without reported diagnosis 6/23   Bronchitis    Cancer ([place])    skin cancer on chest   Cataract 5/23   Chest pain 06/12/2016   Colon polyps    Complication of anesthesia    pt states was given too much during nasal surgery 1989; difficulty getting awake   Complication of anesthesia 05/16/2021   pt states was given too much during nasal surgery 1989; difficulty getting awake   COPD (chronic  obstructive pulmonary disease) ([place])    COPD exacerbation ([place]) 06/01/2013   Coronary artery calcification    Diverticulitis    Diverticulitis 05/16/2021   occasional   Dyspnea 06/27/2016   Emphysema of lung (HCC)    GERD (gastroesophageal reflux disease)    occasional   Hemorrhoids    High cholesterol    Hyperlipidemia LDL goal <70    Mild aortic insufficiency    Osteoporosis    Oxygen deficiency    Pneumonia    Shortness of breath dyspnea    with exertion    Thyroid goiter    bx benign   Tobacco abuse    Vertigo    Vertigo 04/21/2017    Past Surgical History:  Procedure Laterality Date   ABDOMINAL HYSTERECTOMY     APPENDECTOMY  1973   BILATERAL OOPHORECTOMY  10/09/1999   for benign ovarian mass    BIOPSY THYROID     DENTAL SURGERY     dentures   NASAL SEPTUM SURGERY     PARTIAL HYSTERECTOMY  10/08/1974   for heavy menses    RECTAL EXAM UNDER ANESTHESIA N/A 12/14/2015   Procedure: RECTAL EXAM UNDER ANESTHESIA REMOVAL OF ANAL CANAL MASS; INTERNAL HEMORRHOID LIGATION, EXTERNAL HEMORRHOID LIGATION;  Surgeon: Michael Boston, MD;  Location: [facility];  Service: General;  Laterality: N/A;   TUBAL LIGATION     WISDOM  TOOTH EXTRACTION      Family History  Problem Relation Age of Onset   Heart disease Mother    Alcohol abuse Mother    Asthma Mother    COPD Father    Heart disease Father    Lung cancer Maternal Grandfather    Cancer Maternal Grandfather    Liver cancer Paternal Grandmother    Cancer Paternal Grandmother    Diabetes Paternal Aunt    Colon cancer Neg Hx    Colon polyps Neg Hx    Esophageal cancer Neg Hx    Rectal cancer Neg Hx    Stomach cancer Neg Hx     Social History   Socioeconomic History   Marital status: Widowed    Spouse name: Not on file   Number of children: Not on file   Years of education: Not on file   Highest education level: Not on file  Occupational History   Not on file  Tobacco Use   Smoking status: Former    Packs/day:  1.50    Years: 50.00    Total pack years: 75.00    Types: Cigarettes    Quit date: 11/03/2018    Years since quitting: 3.9   Smokeless tobacco: Never  Vaping Use   Vaping Use: Never used  Substance and Sexual Activity   Alcohol use: No   Drug use: No   Sexual activity: Never  Other Topics Concern   Not on file  Social History Narrative   Not on file   Social Determinants of Health   Financial Resource Strain: Low Risk  (07/04/2022)   Overall Financial Resource Strain (CARDIA)    Difficulty of Paying Living Expenses: Not very hard  Food Insecurity: No Food Insecurity (09/24/2022)   Hunger Vital Sign    Worried About Running Out of Food in the Last Year: Never true    [place] in the Last Year: Never true  Transportation Needs: No Transportation Needs (09/24/2022)   PRAPARE - Hydrologist (Medical): No    Lack of Transportation (Non-Medical): No  Physical Activity: Insufficiently Active (07/04/2022)   Exercise Vital Sign    Days of Exercise per Week: 2 days    Minutes of Exercise per Session: 60 min  Stress: No Stress Concern Present (07/04/2022)   [place]    Feeling of Stress : Not at all  Social Connections: Socially Isolated (07/04/2022)   Social Connection and Isolation Panel [NHANES]    Frequency of Communication with Friends and Family: More than three times a week    Frequency of Social Gatherings with Friends and Family: Three times a week    Attends Religious Services: Never    Active Member of Clubs or Organizations: No    Attends Archivist Meetings: Never    Marital Status: Widowed    Allergies  Allergen Reactions   Hydrocodone Nausea And Vomiting   Propoxyphene N-Acetaminophen Nausea And Vomiting    Outpatient Medications Prior to Visit  Medication Sig Dispense Refill   acetaminophen (TYLENOL) 500 MG tablet Take 1 tablet (500 mg total) by  mouth every 4 (four) hours as needed for headache. (Patient taking differently: Take 1,500 mg by mouth at bedtime.) 30 tablet 0   alendronate (FOSAMAX) 70 MG tablet Take 1 tablet (70 mg total) by mouth every 7 (seven) days. Take with a full glass of water on an empty stomach. (Patient taking differently:  Take 70 mg by mouth every Sunday. Take with a full glass of water on an empty stomach.) 4 tablet 6   atorvastatin (LIPITOR) 40 MG tablet TAKE 1 TABLET EVERY DAY (Patient taking differently: Take 40 mg by mouth daily.) 90 tablet 3   azithromycin (ZITHROMAX) 250 MG tablet Take 1 tablet (250 mg total) by mouth daily. 30 tablet 1   benzonatate (TESSALON) 200 MG capsule Take 1 capsule (200 mg total) by mouth 3 (three) times daily as needed for cough. 30 capsule 1   bisacodyl (DULCOLAX) 5 MG EC tablet Take 2 tablets (10 mg total) by mouth daily as needed for moderate constipation. 100 tablet 0   bismuth subsalicylate (PEPTO BISMOL) 262 MG/15ML suspension Take 30 mLs by mouth every 6 (six) hours as needed for indigestion.     BREO ELLIPTA 200-25 MCG/ACT AEPB Inhale 1 puff into the lungs daily.     Cholecalciferol (VITAMIN D3) 25 MCG (1000 UT) CAPS Take 1,000 Units by mouth daily.     cyanocobalamin (VITAMIN B12) 1000 MCG tablet Take 1 tablet (1,000 mcg total) by mouth daily. 100 tablet 2   fluticasone (FLONASE) 50 MCG/ACT nasal spray USE 2 SPRAYS IN EACH NOSTRIL EVERY DAY (Patient taking differently: Place 2 sprays into both nostrils daily as needed for allergies.) 16 g 6   ipratropium-albuterol (DUONEB) 0.5-2.5 (3) MG/3ML SOLN INHALE THE CONTENTS OF 1 VIAL VIA NEBULIZER EVERY 6 HOURS AS NEEDED (Patient taking differently: Take 3 mLs by nebulization 3 (three) times daily.) 180 mL 0   loratadine (CLARITIN) 10 MG tablet Take 1 tablet (10 mg total) by mouth daily. 100 tablet 1   mupirocin ointment (BACTROBAN) 2 % Apply 1 Application topically 2 (two) times daily. To affected nostril (Patient taking differently:  Place 1 Application into the nose See admin instructions. Apply to affected (inner) nostril(s) one to two times a day) 22 g 0   OXYGEN Inhale 3 L/min into the lungs continuous.     pantoprazole (PROTONIX) 20 MG tablet Take 1 tablet (20 mg total) by mouth daily. 90 tablet 2   polyethylene glycol powder (GLYCOLAX/MIRALAX) 17 GM/SCOOP powder Take 17 g by mouth daily as directed. 510 g 0   senna-docusate (SENOKOT-S) 8.6-50 MG tablet Take 2 tablets by mouth 2 (two) times daily.     terbinafine (LAMISIL) 250 MG tablet Take 1 tablet (250 mg total) by mouth daily. 30 tablet 1   Tiotropium Bromide Monohydrate (SPIRIVA RESPIMAT) 2.5 MCG/ACT AERS INHALE 2 PUFFS EVERY DAY (Patient taking differently: Take 2 each by mouth daily.) 12 g 3   traMADol (ULTRAM) 50 MG tablet Take 50 mg by mouth every 6 (six) hours as needed (for pain).     Vitamin D, Ergocalciferol, (DRISDOL) 1.25 MG (50000 UNIT) CAPS capsule Take 1 capsule (50,000 Units total) by mouth every 7 (seven) days. (Patient taking differently: Take 50,000 Units by mouth every Sunday.) 4 capsule 2   ferrous sulfate 325 (65 FE) MG tablet Take 1 tablet (325 mg total) by mouth 2 (two) times daily with a meal. (Patient taking differently: Take 325 mg by mouth daily with breakfast.) 100 tablet 2   methocarbamol (ROBAXIN) 500 MG tablet Take 1 tablet (500 mg total) by mouth every 8 (eight) hours as needed for muscle spasms. 20 tablet 0   No facility-administered medications prior to visit.     ROS Review of Systems  Constitutional:  Negative for activity change and appetite change.  HENT:  Negative for sinus pressure and sore  throat.   Respiratory:  Negative for chest tightness, shortness of breath and wheezing.   Cardiovascular:  Negative for chest pain and palpitations.  Gastrointestinal:  Negative for abdominal distention, abdominal pain and constipation.  Genitourinary: Negative.   Musculoskeletal:        See HPI  Psychiatric/Behavioral:  Negative for  behavioral problems and dysphoric mood.     Objective:  BP 128/69   Pulse (!) 106   Temp 98.2 F (36.8 C) (Oral)   Wt 113 lb 3.2 oz (51.3 kg)   SpO2 96%   BMI 19.43 kg/m      10/04/2022    2:52 PM 09/27/2022   10:55 AM 09/21/2022    2:47 AM  BP/Weight  Systolic BP 767 209 470  Diastolic BP 69 58 47  Wt. (Lbs) 113.2 114   BMI 19.43 kg/m2 19.57 kg/m2       Physical Exam Constitutional:      Appearance: She is well-developed.  HENT:     Nose:     Comments: On 3 L of oxygen via nasal cannula Cardiovascular:     Rate and Rhythm: Bradycardia present.     Heart sounds: Normal heart sounds. No murmur heard. Pulmonary:     Effort: Pulmonary effort is normal.     Breath sounds: Normal breath sounds. No wheezing or rales.  Chest:     Chest wall: No tenderness.  Abdominal:     General: Bowel sounds are normal. There is no distension.     Palpations: Abdomen is soft. There is no mass.     Tenderness: There is no abdominal tenderness.  Musculoskeletal:        General: Normal range of motion.     Right lower leg: No edema.     Left lower leg: No edema.  Neurological:     Mental Status: She is alert and oriented to person, place, and time.  Psychiatric:        Mood and Affect: Mood normal.        Latest Ref Rng & Units 09/27/2022   11:51 AM 09/17/2022    5:18 AM 09/16/2022    5:28 AM  CMP  Glucose 70 - 99 mg/dL 88  80  97   BUN 6 - 23 mg/dL '11  21  23   '$ Creatinine 0.40 - 1.20 mg/dL 0.50  0.56  0.59   Sodium 135 - 145 mEq/L 144  142  142   Potassium 3.5 - 5.1 mEq/L 4.0  3.7  3.7   Chloride 96 - 112 mEq/L 107  109  110   CO2 19 - 32 mEq/L '28  28  27   '$ Calcium 8.4 - 10.5 mg/dL 9.6  8.9  9.0   Total Protein 6.0 - 8.3 g/dL 6.6     Total Bilirubin 0.2 - 1.2 mg/dL 0.3     Alkaline Phos 39 - 117 U/L 139     AST 0 - 37 U/L 12     ALT 0 - 35 U/L 9       Lipid Panel     Component Value Date/Time   CHOL 143 01/30/2022 1158   CHOL 147 02/15/2021 1139   TRIG 132.0  01/30/2022 1158   HDL 74.30 01/30/2022 1158   HDL 58 02/15/2021 1139   CHOLHDL 2 01/30/2022 1158   VLDL 26.4 01/30/2022 1158   LDLCALC 42 01/30/2022 1158   LDLCALC 70 02/15/2021 1139   LDLDIRECT 166 (H) 09/26/2015 1001    CBC  Component Value Date/Time   WBC 7.8 09/27/2022 1151   RBC 3.51 (L) 09/27/2022 1151   HGB 11.3 (L) 09/27/2022 1151   HGB 11.5 04/17/2022 1552   HCT 34.0 (L) 09/27/2022 1151   HCT 38.7 04/17/2022 1552   PLT 304.0 09/27/2022 1151   PLT 423 04/17/2022 1552   MCV 97.0 09/27/2022 1151   MCV 85 04/17/2022 1552   MCH 31.6 09/17/2022 0518   MCHC 33.3 09/27/2022 1151   RDW 14.2 09/27/2022 1151   RDW 25.6 (H) 04/17/2022 1552   LYMPHSABS 1.3 09/27/2022 1151   LYMPHSABS 0.7 04/17/2022 1552   MONOABS 0.9 09/27/2022 1151   EOSABS 0.2 09/27/2022 1151   EOSABS 0.0 04/17/2022 1552   BASOSABS 0.1 09/27/2022 1151   BASOSABS 0.1 04/17/2022 1552    Lab Results  Component Value Date   HGBA1C 5.5 07/04/2022    Assessment & Plan:  1. Lumbar spine pain Uncontrolled Previously noted compression fractures have healed She is already on tramadol Will add on Cymbalta to regimen Advised to apply heat or ice whichever is tolerated to painful areas. Counseled on evidence of improvement in pain control with regards to yoga, water aerobics, massage, home physical therapy, exercise as tolerated. Keep appointment with orthopedics for definitive management - DULoxetine (CYMBALTA) 60 MG capsule; Take 1 capsule (60 mg total) by mouth daily. For chronic pain  Dispense: 30 capsule; Refill: 3 - ketorolac (TORADOL) injection 60 mg  2. Osteoporosis, unspecified osteoporosis type, unspecified pathological fracture presence Stable Currently on Fosamax  3. COPD, group D, by GOLD 2017 classification ([place]) Stable Currently on prednisone taper Continue Spiriva  4. Chronic respiratory failure with hypoxia (HCC) Remains on 3 L of oxygen via nasal cannula  5. Anemia, unspecified  type Hemoglobin is 11.3 and has improved compared to previous labs Iron studies ordered by GI  Meds ordered this encounter  Medications   DULoxetine (CYMBALTA) 60 MG capsule    Sig: Take 1 capsule (60 mg total) by mouth daily. For chronic pain    Dispense:  30 capsule    Refill:  3   ketorolac (TORADOL) injection 60 mg    Follow-up: Return in about 3 months (around 01/03/2023) for Chronic medical conditions.       [name], MD, FAAFP. [facility] [place], [place]   10/04/2022, 5:42 PM

## 2022-10-11 ENCOUNTER — Telehealth: Payer: Self-pay | Admitting: Orthopedic Surgery

## 2022-10-11 NOTE — Telephone Encounter (Signed)
Patient wants an x-ray and she is still bothering her, also the brace is not helping her lower back..please advise..[phone]

## 2022-10-12 ENCOUNTER — Telehealth: Payer: Self-pay | Admitting: Orthopedic Surgery

## 2022-10-12 NOTE — Telephone Encounter (Signed)
Patient waiting on a call to have an X-ray on her back..please advise. This is the 3rd time she has called.Marland Kitchen

## 2022-10-12 NOTE — Telephone Encounter (Signed)
Patient has appt 10/31/22 @ 1p, we can discuss then

## 2022-10-12 NOTE — Telephone Encounter (Signed)
I called and worked scheduled her for 10/17/22 @ 4PM, I advised that we can re-xray at that time but if things have not changed then an MRI would be the next possible step. I ket her appt for 10/31/22

## 2022-10-17 ENCOUNTER — Ambulatory Visit (INDEPENDENT_AMBULATORY_CARE_PROVIDER_SITE_OTHER): Payer: Medicare HMO | Admitting: Pulmonary Disease

## 2022-10-17 ENCOUNTER — Encounter: Payer: Self-pay | Admitting: Pulmonary Disease

## 2022-10-17 ENCOUNTER — Ambulatory Visit (INDEPENDENT_AMBULATORY_CARE_PROVIDER_SITE_OTHER): Payer: Medicare HMO | Admitting: Orthopedic Surgery

## 2022-10-17 ENCOUNTER — Ambulatory Visit: Payer: Self-pay

## 2022-10-17 VITALS — BP 146/70 | HR 72 | Temp 98.3°F | Ht 64.0 in | Wt 111.6 lb

## 2022-10-17 DIAGNOSIS — R911 Solitary pulmonary nodule: Secondary | ICD-10-CM | POA: Diagnosis not present

## 2022-10-17 DIAGNOSIS — S32030G Wedge compression fracture of third lumbar vertebra, subsequent encounter for fracture with delayed healing: Secondary | ICD-10-CM

## 2022-10-17 DIAGNOSIS — Z87891 Personal history of nicotine dependence: Secondary | ICD-10-CM | POA: Diagnosis not present

## 2022-10-17 DIAGNOSIS — J449 Chronic obstructive pulmonary disease, unspecified: Secondary | ICD-10-CM | POA: Diagnosis not present

## 2022-10-17 NOTE — Progress Notes (Signed)
GABRIELL DAIGNEAULT    [id number]    [birth year]  Primary Care Physician:Newlin, Charlane Ferretti, MD  Referring Physician: [name], MD [address] [place],  [place] [address]  Chief complaint:   Follow up for COPD GOLD D  HPI: [name] is a 72 y.o. 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 Her symptoms consist mainly of shortness of breath, daily cough with whitish sputum production, wheeze, dyspnea and exertion. She does not have any hemoptysis, chest pain, palpitations. No fevers, chills.   She is currently maintained on Spiriva and breo.  Tried on Trelegy inhaler in 2019 but had to go back to [place] and Spiriva since it made her breathing worse.  Also tried on Daliresp in 2019 but had to stop due to side effects of nausea  Continues on Breo and Spiriva She had tried Trelegy again in early 2022 but it made her breathing worse She got a course of prednisone in March 2022 for mild COPD exacerbation.  She has been struggling with recurrent COPD exacerbations over the past few years. On maximal therapy with inhalers, chronic prednisone and daily azithromycin She had smoked one pack per day for nearly 25 years. Quit in 2020. 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 She does not recall any particular exposures at work or at home.  Interim history: Has been hospitalized twice this year and Nov 2023 and December 2023 for COPD exacerbation Continues on inhalers, azithromycin to 50 mg a day and is on prednisone taper.  Outpatient Encounter Medications as of 10/17/2022  Medication Sig   acetaminophen (TYLENOL) 500 MG tablet Take 1 tablet (500 mg total) by mouth every 4 (four) hours as needed for headache. (Patient taking differently: Take 1,500 mg by mouth at bedtime.)   alendronate (FOSAMAX) 70 MG tablet Take 1 tablet (70 mg total) by mouth every 7 (seven) days. Take with a full glass of water on an  empty stomach. (Patient taking differently: Take 70 mg by mouth every Sunday. Take with a full glass of water on an empty stomach.)   atorvastatin (LIPITOR) 40 MG tablet TAKE 1 TABLET EVERY DAY (Patient taking differently: Take 40 mg by mouth daily.)   azithromycin (ZITHROMAX) 250 MG tablet Take 1 tablet (250 mg total) by mouth daily.   benzonatate (TESSALON) 200 MG capsule Take 1 capsule (200 mg total) by mouth 3 (three) times daily as needed for cough.   bisacodyl (DULCOLAX) 5 MG EC tablet Take 2 tablets (10 mg total) by mouth daily as needed for moderate constipation.   bismuth subsalicylate (PEPTO BISMOL) 262 MG/15ML suspension Take 30 mLs by mouth every 6 (six) hours as needed for indigestion.   BREO ELLIPTA 200-25 MCG/ACT AEPB Inhale 1 puff into the lungs daily.   Cholecalciferol (VITAMIN D3) 25 MCG (1000 UT) CAPS Take 1,000 Units by mouth daily.   cyanocobalamin (VITAMIN B12) 1000 MCG tablet Take 1 tablet (1,000 mcg total) by mouth daily.   DULoxetine (CYMBALTA) 60 MG capsule Take 1 capsule (60 mg total) by mouth daily. For chronic pain   fluticasone (FLONASE) 50 MCG/ACT nasal spray USE 2 SPRAYS IN EACH NOSTRIL EVERY DAY (Patient taking differently: Place 2 sprays into both nostrils daily as needed for allergies.)   ipratropium-albuterol (DUONEB) 0.5-2.5 (3) MG/3ML SOLN INHALE THE CONTENTS OF 1 VIAL VIA NEBULIZER EVERY 6 HOURS AS NEEDED (Patient taking differently: Take  3 mLs by nebulization 3 (three) times daily.)   loratadine (CLARITIN) 10 MG tablet Take 1 tablet (10 mg total) by mouth daily.   mupirocin ointment (BACTROBAN) 2 % Apply 1 Application topically 2 (two) times daily. To affected nostril (Patient taking differently: Place 1 Application into the nose See admin instructions. Apply to affected (inner) nostril(s) one to two times a day)   OXYGEN Inhale 3 L/min into the lungs continuous.   pantoprazole (PROTONIX) 20 MG tablet Take 1 tablet (20 mg total) by mouth daily.   terbinafine  (LAMISIL) 250 MG tablet Take 1 tablet (250 mg total) by mouth daily.   Tiotropium Bromide Monohydrate (SPIRIVA RESPIMAT) 2.5 MCG/ACT AERS INHALE 2 PUFFS EVERY DAY (Patient taking differently: Take 2 each by mouth daily.)   traMADol (ULTRAM) 50 MG tablet Take 50 mg by mouth every 6 (six) hours as needed (for pain).   Vitamin D, Ergocalciferol, (DRISDOL) 1.25 MG (50000 UNIT) CAPS capsule Take 1 capsule (50,000 Units total) by mouth every 7 (seven) days. (Patient taking differently: Take 50,000 Units by mouth every Sunday.)   [DISCONTINUED] polyethylene glycol powder (GLYCOLAX/MIRALAX) 17 GM/SCOOP powder Take 17 g by mouth daily as directed.   [DISCONTINUED] senna-docusate (SENOKOT-S) 8.6-50 MG tablet Take 2 tablets by mouth 2 (two) times daily.   ferrous sulfate 325 (65 FE) MG tablet Take 1 tablet (325 mg total) by mouth 2 (two) times daily with a meal. (Patient taking differently: Take 325 mg by mouth daily with breakfast.)   [DISCONTINUED] methocarbamol (ROBAXIN) 500 MG tablet Take 1 tablet (500 mg total) by mouth every 8 (eight) hours as needed for muscle spasms.   No facility-administered encounter medications on file as of 10/17/2022.   Physical Exam: Blood pressure (!) 146/70, pulse 72, temperature 98.3 F (36.8 C), temperature source Oral, height '5\' 4"'$  (1.626 m), weight 111 lb 9.6 oz (50.6 kg), SpO2 90 %. Gen:      No acute distress HEENT:  EOMI, sclera anicteric Neck:     No masses; no thyromegaly Lungs:    Diminished air entry CV:         Regular rate and rhythm; no murmurs Abd:      + bowel sounds; soft, non-tender; no palpable masses, no distension Ext:    No edema; adequate peripheral perfusion Skin:      Warm and dry; no rash Neuro: alert and oriented x 3 Psych: normal mood and affect   Data Reviewed: Imaging: Screening CT chest 03/07/18- calcified right thyroid nodule.  Severe centrilobular emphysema, bronchial wall thickening.  Previously described 4.8 mm right lower lobe nodule  now measures 2.9 mm.  Granulomatous splenic and liver calcification.  CT 10/20/2019-resolution of left lower lobe pulmonary nodule, no other lung abnormality   Low-dose screening CT 10/20/2020-new 5.9 mm right lower lobe pulmonary nodule, emphysema, atherosclerosis, gallstones.  Low-dose screening CT 04/24/2021-improvement of right lower lobe pulmonary nodule, emphysema  Low-dose screening CT 07/27/2022-severe emphysema, interval resolution of left lower lobe subpleural nodule, new nodular density in the left upper lobe. I have reviewed the images personally.   PFTs (07/21/15) FVC 2.06 66%), FEV1 0.93 (39%), F/F 45, DLCO 36% Unable complete pleth. Severe obstructive lung disease, severe diffusion impairment  Spirometry 04/12/16 FVC 1.79 [5 7%), FEV1 0.65 (27%), F/F 36 Very severe obstructive airway disease  Labs: CBC 12/03/2018-WBC 9.2, eos 0% ID 10/16/18-1931 Alpha-1 antitrypsin 11/05/2018-171, PI MM  Sputum culture 05/29/2022-Pseudomonas  Assessment:  Severe COPD,Gold Stage D, Chronic bronchitis Continues on Breo and Spiriva.  I am not sure if she is generating enough inspiratory flow for the inhalers.  We discussed switching over to nebulizers but she would like to hold off on this change for now. Has not tolerated Daliresp due to side effects Continue supplemental oxygen She has finished pulmonary rehab 2 times already.  Encouraged her to stay active at home  Unfortunately she is suffering with recurrent exacerbations over the past year.  On daily azithromycin Continue flutter valve daily and hypertonic saline to help with mucociliary clearance  Pulmonary nodule Continue annual low-dose screening CT Has short-term follow-up in later part of January 2024 for new nodules noted on previous scan October 2023  Goals of care We discussed her overall worsening respiratory status and frequent exacerbations.  Palliative care visit is pending. Does not want to consider DNI yet but does  say that she would not want to be supported long-term if there is no recovery  Plan/Recommendations: - Continue Breo, Spiriva - Daily azithromycin - Cough syrup - Flutter valve, hypertonic saline - Follow-up CT scan already ordered  Marshell Garfinkel MD [facility] 10/17/2022, 2:22 PM  CC: [name], MD

## 2022-10-17 NOTE — Progress Notes (Signed)
Orthopedic Spine Surgery Office Note  Assessment: Patient is a 72 y.o. female with L1 and L3 compression fractures   Plan: -Patient has tried multiple conservative treatments for 3 months without any significant relief.  She has severe back pain that is limiting her daily activities.  She also has radiating pain into her hips, so recommended an MRI of the lumbar spine at this time -Provided her with a another Toradol injection today (see procedure note below) -Bed as tolerated, no brace -No bending/lifting/twisting greater than 10 pounds -Patient should return to office in 4 weeks, x-rays at next visit: AP and lateral lumbar x-rays   Patient expressed understanding of the plan and all questions were answered to the patient's satisfaction.    Injection note: After discussing the risks, benefits, and alternatives of a intramuscular Toradol injection, patient elected to proceed with the injection.  The right gluteus muscle was sterilized with an alcohol based prep.  The area was anesthetized with ethyl chloride.  A 60 mg intramuscular injection of Toradol was performed of the left gluteus muscle under standard sterile technique.  Needle was withdrawn and a Band-Aid was applied over the area.  Patient tolerated the injection well.   ___________________________________________________________________________  History: Patient is a 72 y.o. female who has been previously seen in the office for symptoms consistent with lumbar radiculopathy. Since the last visit, symptoms have gotten worse within the last 2 weeks.  She states that she is having significant low back pain.  She feels it on a daily basis.  She gets some relief if she lays down, but feels it all the time.  She cannot do anything around the house because of the pain.  She also notes pain radiating into her bilateral hips.  Within the last day, she has noted pain rating into her right calf as well.  She has no pain radiating in to the left leg  distal to the hip.  No numbness or tingling.  No changes in bowel or bladder habits.  No saddle anesthesia.  Previous treatments: Tramadol, Tylenol, activity modification, topical medication, toradol injection, TLSO brace, PT  COPY OF FIRST NOTE Patient is a 72 y.o. female who presents today for lumbar spine.  Patient states that she was sitting on the toilet about 3 weeks ago and got up and noted acute onset of low back pain.  She states that she may have twisted wrong.  She feels the pain in her lower back and in the paraspinal muscles immediately adjacent to it.  She has no pain that radiates down the leg.  She has been having difficulty ambulating or doing any activity due to the pain.  She has a long history of urinary incontinence with no recent changes.  No bowel incontinence.  She gets some relief with laying down and sitting down.  Has a long history of smoking and a nodule that her PCP has been monitoring.  Has a history of skin cancer.     Weakness: denies Symptoms of imbalance: denies Paresthesias and numbness: sometimes in her feet but improves when she stands up or gets off of her buttocks Bowel or bladder incontinence: has long history of urinary incontinence that is unchanged, no bowel incontinence Saddle anesthesia: denies END OF COPY  Physical Exam:  General: no acute distress, appears stated age Neurologic: alert, answering questions appropriately, following commands Respiratory: unlabored breathing on room air, symmetric chest rise Psychiatric: appropriate affect, normal cadence to speech   MSK (spine):  -Strength exam  Left  Right EHL    5/5  5/5 TA    5/5  5/5 GSC    5/5  5/5 Knee extension  5/5  5/5 Hip flexion   5/5  5/5  -Sensory exam    Sensation intact to light touch in L3-S1 nerve distributions of bilateral lower extremities  -Straight leg raise: negative -Contralateral straight leg raise: negative -Clonus: no beats bilaterally  Imaging: XR  of the lumbar spine from 10/17/2022 was independently reviewed and interpreted, showing L1 and L3 compression fractures.  The L3 compression fracture appears to have had additional height loss.  The L1 compression fracture appears stable.  No new fractures seen.  No dislocation seen.   Patient name: Margaret Henry Patient MRN: [id number] Date of visit: 10/17/22

## 2022-10-17 NOTE — Patient Instructions (Signed)
Glad you are stable with your breathing Continue the inhalers, chronic azithromycin Follow-up in 3 months.

## 2022-10-18 ENCOUNTER — Telehealth: Payer: Self-pay | Admitting: Family Medicine

## 2022-10-18 NOTE — Telephone Encounter (Signed)
Verbal orders given for patient.

## 2022-10-18 NOTE — Telephone Encounter (Signed)
Costella Hatcher calling from [facility] is calling to request continuation of OT & [place] aide.Frequency 2 times a month for 2 months. CB- [place] on VM

## 2022-10-19 ENCOUNTER — Telehealth: Payer: Self-pay | Admitting: Emergency Medicine

## 2022-10-19 NOTE — Telephone Encounter (Signed)
Copied from [place] [id number]. Topic: Quick Communication - Home Health Verbal Orders >> Oct 19, 2022  4:27 PM Everette C wrote: Caller/Agency: Leone Payor Number: [phone] Requesting OT/PT/Skilled Nursing/Social Work/Speech Therapy: PT  Frequency: 1w8

## 2022-10-22 ENCOUNTER — Telehealth: Payer: Self-pay

## 2022-10-22 NOTE — Telephone Encounter (Signed)
PT  Recertification Orders ok 'ed with Gracie  for 1 W 8.

## 2022-10-22 NOTE — Telephone Encounter (Signed)
Gracie from [facility] called for PT re certification orders. See number under contacts. Will forward to office.

## 2022-10-22 NOTE — Telephone Encounter (Signed)
VM left informing to return call for verbal orders.

## 2022-10-23 ENCOUNTER — Other Ambulatory Visit (HOSPITAL_BASED_OUTPATIENT_CLINIC_OR_DEPARTMENT_OTHER): Payer: Self-pay

## 2022-10-24 NOTE — Telephone Encounter (Signed)
Attempted to contact Gracee and message does not state that I can leave orders on VM.

## 2022-10-29 ENCOUNTER — Ambulatory Visit
Admission: RE | Admit: 2022-10-29 | Discharge: 2022-10-29 | Disposition: A | Discharge status: Home / Self Care | Payer: Medicare HMO | Source: Ambulatory Visit | Attending: Acute Care | Admitting: Acute Care

## 2022-10-29 DIAGNOSIS — I700 Atherosclerosis of aorta: Secondary | ICD-10-CM | POA: Diagnosis not present

## 2022-10-29 DIAGNOSIS — R911 Solitary pulmonary nodule: Secondary | ICD-10-CM

## 2022-10-29 DIAGNOSIS — Z87891 Personal history of nicotine dependence: Secondary | ICD-10-CM

## 2022-10-29 DIAGNOSIS — J439 Emphysema, unspecified: Secondary | ICD-10-CM | POA: Diagnosis not present

## 2022-10-30 ENCOUNTER — Telehealth: Payer: Self-pay | Admitting: Acute Care

## 2022-10-30 NOTE — Telephone Encounter (Signed)
Received call report from with [facility] on patient's lung cancer screen CT done on 10/29/22. Sarah please review the result/impression copied below:  IMPRESSION: 1. Lung-RADS 3, probably benign findings. Short-term follow-up in 6 months is recommended with repeat low-dose chest CT without contrast (please use the following order, "CT CHEST LCS NODULE FOLLOW-UP W/O CM"). New indistinct posterior right upper lobe pulmonary nodule measuring 5.0 mm in volume derived mean diameter. Previously visualized 7.4 mm solid anteromedial left upper lobe pulmonary nodule is decreased to 6.5 mm, favoring a benign inflammatory nodule. 2. Calcified 1.5 cm right thyroid nodule. Recommend thyroid US (ref: J Am Coll Radiol. 2015 Feb;12(2): 143-50). 3. Three-vessel coronary atherosclerosis. 4. Cholelithiasis. 5. Aortic Atherosclerosis (ICD10-I70.0) and Emphysema (ICD10-J43.9).  Please advise, thank you.   **Also routing to lung nodule pool**

## 2022-10-31 ENCOUNTER — Ambulatory Visit: Payer: Medicare HMO | Admitting: Orthopedic Surgery

## 2022-11-05 ENCOUNTER — Encounter: Payer: Self-pay | Admitting: *Deleted

## 2022-11-06 ENCOUNTER — Telehealth: Payer: Self-pay | Admitting: Pulmonary Disease

## 2022-11-06 NOTE — Telephone Encounter (Signed)
Pls call PT w/CT results at # on file.

## 2022-11-07 ENCOUNTER — Telehealth: Payer: Self-pay | Admitting: Acute Care

## 2022-11-07 ENCOUNTER — Other Ambulatory Visit: Payer: Self-pay

## 2022-11-07 DIAGNOSIS — R911 Solitary pulmonary nodule: Secondary | ICD-10-CM

## 2022-11-07 DIAGNOSIS — Z87891 Personal history of nicotine dependence: Secondary | ICD-10-CM

## 2022-11-07 NOTE — Telephone Encounter (Signed)
Results/plan faxed to PCP. New order placed for 6 months nodule follow up LCS LDCT

## 2022-11-07 NOTE — Telephone Encounter (Signed)
I have called the patient with the results of her low dose Ct Chest. I explained that the nodule of concern form her 07/2022 scan has reduced in size, but that she has a new nodule that is 5 mm in size that will need follow up in 6 months. She is in agreement with a 6 month follow up. We did talk about the recommendation for a [place] of a 1.5 cm right thyroid nodule . Pt. States he had this biopsied 12/16/2009, and that it was resulted as  Findings consistent with non-neoplastic goiter.  I will ask radiology to specify if this is a new finding or growth that required additional follow up to what she had done in 2011.  I have called the reading room and they will message him for clarification  Margaret Henry, 6 month follow up scan and fax results to PCP with plan. Thanks so much

## 2022-11-09 NOTE — Telephone Encounter (Signed)
Spoke with patient. She is wanting to know if the Radiologist advised on whether she needed a CT of her Thyroid yet.  Judson Roch can you please advise?

## 2022-11-09 NOTE — Telephone Encounter (Signed)
Spoke with patient. Advised of Cts from 2023 and 2016. Radiologist stated no further testing was needed. Pt verbalized understanding. Nothing further needed.

## 2022-11-10 ENCOUNTER — Ambulatory Visit
Admission: RE | Admit: 2022-11-10 | Discharge: 2022-11-10 | Disposition: A | Discharge status: Home / Self Care | Payer: Medicare HMO | Source: Ambulatory Visit | Attending: Orthopedic Surgery | Admitting: Orthopedic Surgery

## 2022-11-10 DIAGNOSIS — S32030G Wedge compression fracture of third lumbar vertebra, subsequent encounter for fracture with delayed healing: Secondary | ICD-10-CM

## 2022-11-10 DIAGNOSIS — M545 Low back pain, unspecified: Secondary | ICD-10-CM | POA: Diagnosis not present

## 2022-11-14 ENCOUNTER — Telehealth: Payer: Self-pay | Admitting: Orthopedic Surgery

## 2022-11-14 DIAGNOSIS — M545 Low back pain, unspecified: Secondary | ICD-10-CM

## 2022-11-14 NOTE — Telephone Encounter (Signed)
Patient called in requesting pain medication please advise, would like something stronger than he prescribed from the last fracture she states and states she also spoke with him about pain management after her MRI but no one has reached out and no referral has been put out

## 2022-11-15 ENCOUNTER — Telehealth: Payer: Self-pay | Admitting: Orthopedic Surgery

## 2022-11-15 ENCOUNTER — Other Ambulatory Visit (HOSPITAL_BASED_OUTPATIENT_CLINIC_OR_DEPARTMENT_OTHER): Payer: Self-pay

## 2022-11-15 MED ORDER — HYDROCODONE-ACETAMINOPHEN 5-325 MG PO TABS
1.0000 | ORAL_TABLET | ORAL | 0 refills | Status: DC | PRN
  Filled 2022-11-15: qty 30, 5d supply, fill #0

## 2022-11-15 MED ORDER — OXYCODONE HCL 5 MG PO TABS
2.5000 mg | ORAL_TABLET | ORAL | 0 refills | Status: AC | PRN
  Filled 2022-11-15: qty 15, 5d supply, fill #0

## 2022-11-15 NOTE — Telephone Encounter (Signed)
Margaret Henry has dealt with this

## 2022-11-15 NOTE — Telephone Encounter (Signed)
She cannot take anything with Codeine in it--it makes her deathly ill.

## 2022-11-15 NOTE — Telephone Encounter (Signed)
Patient is aware -but see other message

## 2022-11-15 NOTE — Telephone Encounter (Signed)
Patient is ALLERGIC to HYDROCODONE please advise send in a different Rx (states in chart)

## 2022-11-15 NOTE — Telephone Encounter (Signed)
Patient has concerns about her medication and she wants to speak to someone. She states she is allergic  to codeine- however in her chart it only states she has the side affects. I sent call to triage. I did not advise the patient any advise or information, just transferred call to triage. This is just a note for reference.

## 2022-11-15 NOTE — Addendum Note (Signed)
Addended by: Ileene Rubens on: 11/15/2022 09:02 AM   Modules accepted: Orders

## 2022-11-15 NOTE — Addendum Note (Signed)
Addended by: Ileene Rubens on: 11/15/2022 10:52 AM   Modules accepted: Orders

## 2022-11-15 NOTE — Addendum Note (Signed)
Addended by: Ileene Rubens on: 11/15/2022 10:54 AM   Modules accepted: Orders

## 2022-11-16 ENCOUNTER — Other Ambulatory Visit (HOSPITAL_BASED_OUTPATIENT_CLINIC_OR_DEPARTMENT_OTHER): Payer: Self-pay

## 2022-11-16 NOTE — Telephone Encounter (Signed)
I called and advised her of Dr. Tawanna Sat response and she states that she tried the hydrocodone and it made her deathly ill. I advised her that she could speak with the pharmacist about the 2 meds.  But she states that she will try the tylenol and Advil, I advised that she should alternate these 2 medications and she states that she understands

## 2022-11-23 ENCOUNTER — Telehealth: Payer: Self-pay | Admitting: Physician Assistant

## 2022-11-23 NOTE — Telephone Encounter (Signed)
Patient called requesting to get orders to get some labs done if possible.

## 2022-11-23 NOTE — Telephone Encounter (Signed)
Pt needs labs prior to her appt with Dr. Hilarie Fredrickson. Let her know the orders are already in and she can come M-F between 7:30am-5pm, no appt is needed.

## 2022-11-26 ENCOUNTER — Ambulatory Visit (INDEPENDENT_AMBULATORY_CARE_PROVIDER_SITE_OTHER): Payer: Medicare HMO

## 2022-11-26 ENCOUNTER — Other Ambulatory Visit (HOSPITAL_BASED_OUTPATIENT_CLINIC_OR_DEPARTMENT_OTHER): Payer: Self-pay

## 2022-11-26 ENCOUNTER — Ambulatory Visit (INDEPENDENT_AMBULATORY_CARE_PROVIDER_SITE_OTHER): Payer: Medicare HMO | Admitting: Orthopedic Surgery

## 2022-11-26 ENCOUNTER — Other Ambulatory Visit: Payer: Self-pay

## 2022-11-26 ENCOUNTER — Other Ambulatory Visit (INDEPENDENT_AMBULATORY_CARE_PROVIDER_SITE_OTHER): Payer: Medicare HMO

## 2022-11-26 DIAGNOSIS — S32030G Wedge compression fracture of third lumbar vertebra, subsequent encounter for fracture with delayed healing: Secondary | ICD-10-CM | POA: Diagnosis not present

## 2022-11-26 DIAGNOSIS — D519 Vitamin B12 deficiency anemia, unspecified: Secondary | ICD-10-CM

## 2022-11-26 DIAGNOSIS — D509 Iron deficiency anemia, unspecified: Secondary | ICD-10-CM

## 2022-11-26 LAB — IBC + FERRITIN
Ferritin: 25 ng/mL (ref 10.0–291.0)
Iron: 76 ug/dL (ref 42–145)
Saturation Ratios: 20.3 % (ref 20.0–50.0)
TIBC: 373.8 ug/dL (ref 250.0–450.0)
Transferrin: 267 mg/dL (ref 212.0–360.0)

## 2022-11-26 LAB — COMPREHENSIVE METABOLIC PANEL
ALT: 6 U/L (ref 0–35)
AST: 12 U/L (ref 0–37)
Albumin: 4.2 g/dL (ref 3.5–5.2)
Alkaline Phosphatase: 86 U/L (ref 39–117)
BUN: 12 mg/dL (ref 6–23)
CO2: 28 mEq/L (ref 19–32)
Calcium: 9.9 mg/dL (ref 8.4–10.5)
Chloride: 105 mEq/L (ref 96–112)
Creatinine, Ser: 0.57 mg/dL (ref 0.40–1.20)
GFR: 90.74 mL/min (ref >60.00)
Glucose, Bld: 94 mg/dL (ref 70–99)
Potassium: 4.1 mEq/L (ref 3.5–5.1)
Sodium: 140 mEq/L (ref 135–145)
Total Bilirubin: 0.4 mg/dL (ref 0.2–1.2)
Total Protein: 6.8 g/dL (ref 6.0–8.3)

## 2022-11-26 LAB — CBC WITH DIFFERENTIAL/PLATELET
Basophils Absolute: 0.1 10*3/uL (ref 0.0–0.1)
Basophils Relative: 1.1 % (ref 0.0–3.0)
Eosinophils Absolute: 0.3 10*3/uL (ref 0.0–0.7)
Eosinophils Relative: 4.1 % (ref 0.0–5.0)
HCT: 35.2 % — ABNORMAL LOW (ref 36.0–46.0)
Hemoglobin: 11.6 g/dL — ABNORMAL LOW (ref 12.0–15.0)
Lymphocytes Relative: 18.3 % (ref 12.0–46.0)
Lymphs Abs: 1.4 10*3/uL (ref 0.7–4.0)
MCHC: 33.1 g/dL (ref 30.0–36.0)
MCV: 94.6 fl (ref 78.0–100.0)
Monocytes Absolute: 0.8 10*3/uL (ref 0.1–1.0)
Monocytes Relative: 10.5 % (ref 3.0–12.0)
Neutro Abs: 5 10*3/uL (ref 1.4–7.7)
Neutrophils Relative %: 66 % (ref 43.0–77.0)
Platelets: 294 10*3/uL (ref 150.0–400.0)
RBC: 3.72 Mil/uL — ABNORMAL LOW (ref 3.87–5.11)
RDW: 14.9 % (ref 11.5–15.5)
WBC: 7.6 10*3/uL (ref 4.0–10.5)

## 2022-11-26 MED ORDER — TRAMADOL HCL 50 MG PO TABS
50.0000 mg | ORAL_TABLET | Freq: Four times a day (QID) | ORAL | 0 refills | Status: DC | PRN
  Filled 2022-11-26: qty 20, 5d supply, fill #0

## 2022-11-26 NOTE — Telephone Encounter (Signed)
Patient is calling wondering if they can check her "blood levels" when she comes in to get her blood work, such as her hemoglobin and B12 levels. Please advise

## 2022-11-26 NOTE — Telephone Encounter (Signed)
Pt is supposed to have IBCF drawn. See request below from additional labs and advise.

## 2022-11-26 NOTE — Progress Notes (Signed)
Orthopedic Spine Surgery Office Note  Assessment: Patient is a 72 y.o. female with back pain and multiple compression fractures. Pain has been getting better with time.    Plan: -No acute operative intervention -Patient is getting treated for osteoporosis -Patient was interested in a repeat toradol injection so one was done today in office -Out of bed as tolerated, no brace -No bending/lifting/twisting greater than 10 pounds -Patient has tried tramadol, tylenol, activity modification, topical medication, toradol injection, TLSO brace, PT  -Recommended continue with the alternative NSAID and tylenol since that seems to help. Tramadol for severe pain, new prescription provided -Patient should return to office in 6 weeks, x-rays at next visit: lumbar AP and lateral    Patient expressed understanding of the plan and all questions were answered to the patient's satisfaction.   Injection note: After discussing the risks, benefits, and alternatives of a intramuscular Toradol injection, patient elected to proceed with the injection.  The right gluteus muscle was sterilized with an alcohol based prep.  The area was anesthetized with ethyl chloride.  A 60 mg intramuscular injection of Toradol was performed of the left gluteus muscle under standard sterile technique.  Needle was withdrawn and a Band-Aid was applied over the area.  Patient tolerated the injection well.    ___________________________________________________________________________  History: Patient is a 72 y.o. female who has been previously seen in the office for low back which was consistent with compression fracture. She did get good short term relief with a toradol injection. That wore off after a few days, but her pain has been getting better gradually. Her pain is much better than last time I saw her. She has no noticed any imbalance when walking. No radiating leg pain. No changes in bowel or bladder habit. No saddle anesthesia.    Previous treatments: Tramadol, Tylenol, activity modification, topical medication, toradol injection, TLSO brace, PT   Physical Exam:  General: no acute distress, appears stated age Neurologic: alert, answering questions appropriately, following commands Respiratory: unlabored breathing on room air, symmetric chest rise Psychiatric: appropriate affect, normal cadence to speech   MSK (spine):  -Strength exam      Left  Right EHL    5/5  5/5 TA    5/5  5/5 GSC    5/5  5/5 Knee extension  5/5  5/5 Hip flexion   5/5  5/5  -Sensory exam    Sensation intact to light touch in L3-S1 nerve distributions of bilateral lower extremities  -Achilles DTR: 1/4 on the left, 1/4 on the right -Patellar tendon DTR: 1/4 on the left, 1/4 on the right  -Able to perform heel to shin testing bilaterally  -Clonus: no beats bilaterally  Imaging: XR of the lumbar spine from 11/26/2022 was independently reviewed and interpreted, showing L1, L3, and L5 compression fractures. No increased anterior height loss since last films. There appears to be a T11 compression fracture as well.   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 No T2 cord signal change at the level of the disc herniation. Compression fractures at T11, L1, L3, and L5.    Patient name: Margaret Henry Patient MRN: [id number] Date of visit: 11/26/22

## 2022-11-28 ENCOUNTER — Ambulatory Visit: Payer: Medicare HMO | Attending: Cardiology | Admitting: Cardiology

## 2022-11-28 ENCOUNTER — Encounter: Payer: Self-pay | Admitting: Cardiology

## 2022-11-28 ENCOUNTER — Other Ambulatory Visit (HOSPITAL_BASED_OUTPATIENT_CLINIC_OR_DEPARTMENT_OTHER): Payer: Self-pay

## 2022-11-28 VITALS — BP 156/68 | HR 100 | Ht 64.0 in | Wt 110.0 lb

## 2022-11-28 DIAGNOSIS — J9611 Chronic respiratory failure with hypoxia: Secondary | ICD-10-CM

## 2022-11-28 DIAGNOSIS — R0609 Other forms of dyspnea: Secondary | ICD-10-CM

## 2022-11-28 DIAGNOSIS — E785 Hyperlipidemia, unspecified: Secondary | ICD-10-CM | POA: Diagnosis not present

## 2022-11-28 DIAGNOSIS — I2510 Atherosclerotic heart disease of native coronary artery without angina pectoris: Secondary | ICD-10-CM | POA: Diagnosis not present

## 2022-11-28 DIAGNOSIS — I351 Nonrheumatic aortic (valve) insufficiency: Secondary | ICD-10-CM | POA: Diagnosis not present

## 2022-11-28 MED ORDER — NITROGLYCERIN 0.4 MG SL SUBL
0.4000 mg | SUBLINGUAL_TABLET | SUBLINGUAL | 6 refills | Status: AC | PRN
  Filled 2022-11-28: qty 25, 25d supply, fill #0

## 2022-11-28 NOTE — Patient Instructions (Addendum)
Medication Instructions:  START: Nitroglycerin  Use nitroglycerin 1 tablet placed under the tongue at the first sign of chest pain or an angina attack. 1 tablet may be used every 5 minutes as needed, for up to 15 minutes. Do not take more than 3 tablets in 15 minutes. If pain persist call 911 or go to the nearest ED.     Lab Work: None Ordered If you have labs (blood work) drawn today and your tests are completely normal, you will receive your results only by: [place] (if you have MyChart) OR A paper copy in the mail If you have any lab test that is abnormal or we need to change your treatment, we will call you to review the results.   Testing/Procedures: Your physician has requested that you have an echocardiogram. Echocardiography is a painless test that uses sound waves to create images of your heart. It provides your doctor with information about the size and shape of your heart and how well your heart's chambers and valves are working. This procedure takes approximately one hour. There are no restrictions for this procedure. Please do NOT wear cologne, perfume, aftershave, or lotions (deodorant is allowed). Please arrive 15 minutes prior to your appointment time.    Follow-Up: At [facility], you and your health needs are our priority.  As part of our continuing mission to provide you with exceptional heart care, we have created designated Provider Care Teams.  These Care Teams include your primary Cardiologist (physician) and Advanced Practice Providers (APPs -  Physician Assistants and Nurse Practitioners) who all work together to provide you with the care you need, when you need it.  We recommend signing up for the patient portal called "MyChart".  Sign up information is provided on this After Visit Summary.  MyChart is used to connect with patients for Virtual Visits (Telemedicine).  Patients are able to view lab/test results, encounter notes, upcoming appointments, etc.   Non-urgent messages can be sent to your provider as well.   To learn more about what you can do with MyChart, go to [email or web address].    Your next appointment:   12 month(s)  The format for your next appointment:   In Person  Provider:   Jenne Campus, MD    Other Instructions NA

## 2022-11-28 NOTE — Progress Notes (Signed)
Cardiology Office Note:    Date:  11/28/2022   ID:  Margaret Henry, DOB [birth year], MRN [id number]  PCP:  [name], MD  Cardiologist:  Jenne Campus, MD    Referring MD: [name], MD   No chief complaint on file.   History of Present Illness:    Margaret Henry is a 72 y.o. female with past medical history significant for advanced COPD, she quit smoking in 2019.  She is on oxygen and steroid-dependent however steroids has been discontinued recently, history of coronary calcification initially diagnosed in 2019 after that stress test showed no evidence of ischemia she does have also arctic insufficiency which likely is mild.  Last time I seen her she was severely anemic required 2 units of blood.  Since that time she seems to be doing well.  She is sedentary she sits in the chair she does have some compression fractures in her spine which is very painful she does not walk much.  Denies having any palpitations but described to have a few episode of chest sensations she said strength sensation of the chest that typically happen at rest.  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)   Anal intraepithelial neoplasia II (AIN II) 2017   Anemia 04/06/22   Aortic atherosclerosis ([place])    B12 deficiency    Blood transfusion without reported diagnosis 6/23   Bronchitis    Cancer ([place])    skin cancer on chest   Cataract 5/23   Chest pain 06/12/2016   Cholelithiasis    Colon polyps    Complication of anesthesia 05/16/2021   pt states was given too much during nasal surgery 1989; difficulty getting awake   COPD (chronic obstructive pulmonary disease) ([place])    Coronary artery calcification    Diverticulitis 05/16/2021   occasional    Diverticulosis    Dyspnea 06/27/2016   Emphysema of lung (HCC)    External hemorrhoids    GERD (gastroesophageal reflux disease)    occasional   Hemorrhoids    High cholesterol    Hyperlipidemia LDL goal <70    Iron deficiency    Mild aortic insufficiency    Neuropathy 04/07/2022   Osteoporosis    Oxygen deficiency    Pneumonia    Shortness of breath dyspnea    with exertion    Thyroid goiter    bx benign   Tobacco abuse    Vertigo 04/21/2017    Past Surgical History:  Procedure Laterality Date   ABDOMINAL HYSTERECTOMY     APPENDECTOMY  1973   BILATERAL OOPHORECTOMY  10/09/1999   for benign ovarian mass    BIOPSY THYROID     DENTAL SURGERY     dentures   NASAL SEPTUM SURGERY     PARTIAL HYSTERECTOMY  10/08/1974   for heavy menses    RECTAL EXAM UNDER ANESTHESIA N/A 12/14/2015   Procedure: RECTAL EXAM UNDER ANESTHESIA REMOVAL OF ANAL CANAL MASS; INTERNAL HEMORRHOID LIGATION, EXTERNAL HEMORRHOID LIGATION;  Surgeon: Michael Boston, MD;  Location: [facility];  Service: General;  Laterality: N/A;   TUBAL LIGATION     WISDOM TOOTH EXTRACTION      Current Medications: Current Meds  Medication Sig   alendronate (FOSAMAX)  70 MG tablet Take 1 tablet (70 mg total) by mouth every 7 (seven) days. Take with a full glass of water on an empty stomach. (Patient taking differently: Take 70 mg by mouth every Sunday. Take with a full glass of water on an empty stomach.)   atorvastatin (LIPITOR) 40 MG tablet TAKE 1 TABLET EVERY DAY (Patient taking differently: Take 40 mg by mouth daily.)   azithromycin (ZITHROMAX) 250 MG tablet Take 1 tablet (250 mg total) by mouth daily.   benzonatate (TESSALON) 200 MG capsule Take 1 capsule (200 mg total) by mouth 3 (three) times daily as needed for cough.   bisacodyl (DULCOLAX) 5 MG EC tablet Take 2 tablets (10 mg total) by mouth daily as needed for moderate constipation.   bismuth subsalicylate (PEPTO BISMOL) 262 MG/15ML suspension Take 30 mLs by mouth  every 6 (six) hours as needed for indigestion.   BREO ELLIPTA 200-25 MCG/ACT AEPB Inhale 1 puff into the lungs daily.   Cholecalciferol (VITAMIN D3) 25 MCG (1000 UT) CAPS Take 1,000 Units by mouth daily.   DULoxetine (CYMBALTA) 60 MG capsule Take 1 capsule (60 mg total) by mouth daily. For chronic pain   ferrous sulfate 325 (65 FE) MG tablet Take 1 tablet (325 mg total) by mouth 2 (two) times daily with a meal. (Patient taking differently: Take 325 mg by mouth daily with breakfast.)   fluticasone (FLONASE) 50 MCG/ACT nasal spray USE 2 SPRAYS IN EACH NOSTRIL EVERY DAY (Patient taking differently: Place 2 sprays into both nostrils daily as needed for allergies.)   ipratropium-albuterol (DUONEB) 0.5-2.5 (3) MG/3ML SOLN INHALE THE CONTENTS OF 1 VIAL VIA NEBULIZER EVERY 6 HOURS AS NEEDED (Patient taking differently: Take 3 mLs by nebulization 3 (three) times daily.)   loratadine (CLARITIN) 10 MG tablet Take 1 tablet (10 mg total) by mouth daily.   mupirocin ointment (BACTROBAN) 2 % Apply 1 Application topically 2 (two) times daily. To affected nostril (Patient taking differently: Place 1 Application into the nose See admin instructions. Apply to affected (inner) nostril(s) one to two times a day)   OXYGEN Inhale 3 L/min into the lungs continuous.   pantoprazole (PROTONIX) 20 MG tablet Take 1 tablet (20 mg total) by mouth daily.   Tiotropium Bromide Monohydrate (SPIRIVA RESPIMAT) 2.5 MCG/ACT AERS INHALE 2 PUFFS EVERY DAY (Patient taking differently: Take 2 each by mouth daily.)   traMADol (ULTRAM) 50 MG tablet Take 1 tablet (50 mg total) by mouth every 6 (six) hours as needed for up to 5 days.     Allergies:   Hydrocodone and Propoxyphene n-acetaminophen   Social History   Socioeconomic History   Marital status: Widowed    Spouse name: Not on file   Number of children: Not on file   Years of education: Not on file   Highest education level: Not on file  Occupational History   Not on file   Tobacco Use   Smoking status: Former    Packs/day: 1.50    Years: 50.00    Total pack years: 75.00    Types: Cigarettes    Quit date: 11/03/2018    Years since quitting: 4.0   Smokeless tobacco: Never  Vaping Use   Vaping Use: Never used  Substance and Sexual Activity   Alcohol use: No   Drug use: No   Sexual activity: Never  Other Topics Concern   Not on file  Social History Narrative   Not on file   Social Determinants of Health  Financial Resource Strain: Low Risk  (07/04/2022)   Overall Financial Resource Strain (CARDIA)    Difficulty of Paying Living Expenses: Not very hard  Food Insecurity: No Food Insecurity (09/24/2022)   Hunger Vital Sign    Worried About Running Out of Food in the Last Year: Never true    Ran Out of Food in the Last Year: Never true  Transportation Needs: No Transportation Needs (09/24/2022)   PRAPARE - Hydrologist (Medical): No    Lack of Transportation (Non-Medical): No  Physical Activity: Insufficiently Active (07/04/2022)   Exercise Vital Sign    Days of Exercise per Week: 2 days    Minutes of Exercise per Session: 60 min  Stress: No Stress Concern Present (07/04/2022)   [place]    Feeling of Stress : Not at all  Social Connections: Socially Isolated (07/04/2022)   Social Connection and Isolation Panel [NHANES]    Frequency of Communication with Friends and Family: More than three times a week    Frequency of Social Gatherings with Friends and Family: Three times a week    Attends Religious Services: Never    Active Member of Clubs or Organizations: No    Attends Archivist Meetings: Never    Marital Status: Widowed     Family History: The patient's family history includes Alcohol abuse in her mother; Asthma in her mother; COPD in her father; Cancer in her maternal grandfather and paternal grandmother; Diabetes in her paternal  aunt; Heart disease in her father and mother; Liver cancer in her paternal grandmother; Lung cancer in her maternal grandfather. There is no history of Colon cancer, Colon polyps, Esophageal cancer, Rectal cancer, or Stomach cancer. ROS:   Please see the history of present illness.    All 14 point review of systems negative except as described per history of present illness  EKGs/Labs/Other Studies Reviewed:      Recent Labs: 04/12/2022: Magnesium 2.4 09/17/2022: TSH 0.627 11/26/2022: ALT 6; BUN 12; Creatinine, Ser 0.57; Hemoglobin 11.6; Platelets 294.0; Potassium 4.1; Sodium 140  Recent Lipid Panel    Component Value Date/Time   CHOL 143 01/30/2022 1158   CHOL 147 02/15/2021 1139   TRIG 132.0 01/30/2022 1158   HDL 74.30 01/30/2022 1158   HDL 58 02/15/2021 1139   CHOLHDL 2 01/30/2022 1158   VLDL 26.4 01/30/2022 1158   LDLCALC 42 01/30/2022 1158   LDLCALC 70 02/15/2021 1139   LDLDIRECT 166 (H) 09/26/2015 1001    Physical Exam:    VS:  BP (!) 156/68 (BP Location: Right Arm, Patient Position: Sitting)   Pulse 100   Ht 5' 4"$  (1.626 m)   Wt 110 lb (49.9 kg)   SpO2 93% Comment: 3 liters [place]  BMI 18.88 kg/m     Wt Readings from Last 3 Encounters:  11/28/22 110 lb (49.9 kg)  10/17/22 111 lb 9.6 oz (50.6 kg)  10/04/22 113 lb 3.2 oz (51.3 kg)     GEN:  Well nourished, well developed in no acute distress HEENT: Normal NECK: No JVD; No carotid bruits LYMPHATICS: No lymphadenopathy CARDIAC: RRR, no murmurs, no rubs, no gallops RESPIRATORY: Poor air entry bilaterally ABDOMEN: Soft, non-tender, non-distended MUSCULOSKELETAL:  No edema; No deformity  SKIN: Warm and dry LOWER EXTREMITIES: no swelling NEUROLOGIC:  Alert and oriented x 3 PSYCHIATRIC:  Normal affect   ASSESSMENT:    1. Coronary arteriosclerosis   2. Mild aortic insufficiency  3. Chronic respiratory failure with hypoxia (HCC)   4. Hyperlipidemia, unspecified hyperlipidemia type    PLAN:    In order of  problems listed above:  Coronary atherosclerosis likely stress test negative.  I offer her nitroglycerin as needed she accepted that offer I told her to let me know if the pain became more frequent.  She is very reluctant to take any medication for his symptoms however I told her that that will serve as both that does take dual as well as helpful medications.  She is not anticoagulated/not on antiplatelet therapy secondary to the fact that she got significant GI bleed however as far as I know no source of bleeding has been established Mild aortic insufficiency: Will do echocardiogram. Chronic respiratory failure with COPD followed by antimedicine team. Hyperlipidemia I did review her K PN which show me LDL 42 HDL 74 we will continue present management   Medication Adjustments/Labs and Tests Ordered: Current medicines are reviewed at length with the patient today.  Concerns regarding medicines are outlined above.  No orders of the defined types were placed in this encounter.  Medication changes: No orders of the defined types were placed in this encounter.   Signed, Park Liter, MD, [facility] 11/28/2022 1:21 PM    [facility]

## 2022-11-28 NOTE — Addendum Note (Signed)
Addended by: Jacobo Forest D on: 11/28/2022 01:29 PM   Modules accepted: Orders

## 2022-12-04 ENCOUNTER — Encounter: Payer: Self-pay | Admitting: Internal Medicine

## 2022-12-04 ENCOUNTER — Ambulatory Visit (INDEPENDENT_AMBULATORY_CARE_PROVIDER_SITE_OTHER): Payer: Medicare HMO | Admitting: Internal Medicine

## 2022-12-04 VITALS — BP 148/64 | HR 84 | Ht 64.0 in | Wt 110.0 lb

## 2022-12-04 DIAGNOSIS — E538 Deficiency of other specified B group vitamins: Secondary | ICD-10-CM | POA: Diagnosis not present

## 2022-12-04 DIAGNOSIS — R195 Other fecal abnormalities: Secondary | ICD-10-CM | POA: Diagnosis not present

## 2022-12-04 DIAGNOSIS — D509 Iron deficiency anemia, unspecified: Secondary | ICD-10-CM

## 2022-12-04 DIAGNOSIS — K5909 Other constipation: Secondary | ICD-10-CM | POA: Diagnosis not present

## 2022-12-04 DIAGNOSIS — Z86010 Personal history of colonic polyps: Secondary | ICD-10-CM

## 2022-12-04 DIAGNOSIS — K219 Gastro-esophageal reflux disease without esophagitis: Secondary | ICD-10-CM

## 2022-12-04 NOTE — Progress Notes (Signed)
Subjective:    Patient ID: Margaret Henry, female    DOB: [birth year], 72 y.o.   MRN: [id number]  HPI Calena Hults is a 72 year old female with a history of iron deficiency and deficiency anemia, history of adenomatous colon polyps, AIN-III treated with surgical resection in 2017, severe Gold D COPD on continuous oxygen who is here for follow-up.  She is here today with her son.  She was last seen on 09/27/2022 by Vicie Mutters, PA-C.  At her last visit she was seen for iron deficiency and B12 anemia requiring blood transfusion.  Her hemoglobin nadir was 6.  She also had a positive Cologuard several months ago which was ordered by primary care.  Today she reports she is feeling considerably better from a GI perspective.  She is taking iron every other day.  She is having intermittent constipation where she can go 2 to 3 days between bowel movement.  When this is happening she has more intestinal gas.  And then she will occasionally have what she calls "blowouts" with loose more urgent stools.  These episodes happen after she has been constipated.  She is try to increase fiber in her diet and eating vegetables at least 2 days/week.  She has not seen any visible blood in her stool or melena.  She denies upper GI and hepatobiliary complaint.  No nausea or vomiting.  No heartburn symptom.  She is taking pantoprazole 20 mg daily.  She has recently weaned herself off of prednisone.  She sees Dr. Vaughan Browner with pulmonology.  They have entertained possibly changing her inhalers to nebulizers and also involving palliative care.   Review of Systems As per HPI, otherwise negative  Current Medications, Allergies, Past Medical History, Past Surgical History, Family History and Social History were reviewed in [employer] record.    Objective:   Physical Exam BP (!) 148/64   Pulse 84   Ht '5\' 4"'$  (1.626 m)   Wt 110 lb (49.9 kg)   SpO2 91%   BMI 18.88 kg/m  Gen: awake, alert,  NAD HEENT: anicteric  CV: RRR, no mrg Pulm: Distant breath sounds bilaterally Abd: soft, NT/ND, +BS throughout Ext: no c/c/e Neuro: nonfocal     Latest Ref Rng & Units 11/26/2022    3:10 PM 09/27/2022   11:51 AM 09/17/2022    5:18 AM  CBC  WBC 4.0 - 10.5 K/uL 7.6  7.8  8.6   Hemoglobin 12.0 - 15.0 g/dL 11.6  11.3  9.7   Hematocrit 36.0 - 46.0 % 35.2  34.0  31.4   Platelets 150.0 - 400.0 K/uL 294.0  304.0  217    Iron/TIBC/Ferritin/ %Sat    Component Value Date/Time   IRON 76 11/26/2022 1452   TIBC 373.8 11/26/2022 1452   FERRITIN 25.0 11/26/2022 1452   IRONPCTSAT 20.3 11/26/2022 1452   CMP     Component Value Date/Time   NA 140 11/26/2022 1510   NA 144 07/04/2022 1636   K 4.1 11/26/2022 1510   CL 105 11/26/2022 1510   CO2 28 11/26/2022 1510   GLUCOSE 94 11/26/2022 1510   BUN 12 11/26/2022 1510   BUN 12 07/04/2022 1636   CREATININE 0.57 11/26/2022 1510   CREATININE 0.60 09/05/2016 1218   CALCIUM 9.9 11/26/2022 1510   PROT 6.8 11/26/2022 1510   PROT 6.6 07/04/2022 1636   ALBUMIN 4.2 11/26/2022 1510   ALBUMIN 4.3 07/04/2022 1636   AST 12 11/26/2022 1510   ALT 6 11/26/2022  [place] 11/26/2022 1510   BILITOT 0.4 11/26/2022 1510   BILITOT <0.2 07/04/2022 1636   GFRNONAA >60 09/17/2022 0518   GFRNONAA >89 03/26/2016 1022   GFRAA 109 11/18/2020 1201   GFRAA >89 03/26/2016 1022   Lab Results  Component Value Date   VITAMINB12 650 09/27/2022        Assessment & Plan:  72 year old female with a history of iron deficiency and deficiency anemia, history of adenomatous colon polyps, AIN-III treated with surgical resection in 2017, severe Gold D COPD on continuous oxygen who is here for follow-up.  IDA/+ Cologuard/hx colonic adenoma and AIN s/p resection --we discussed how her personal history of colon polyps, AIN, positive Cologuard and iron deficiency would warrant endoscopic evaluation definitively with colonoscopy but quite possibly upper endoscopy.   However, her lung disease is severe and monitored anesthesia care for these procedures would have significantly high risk for her in the setting of her advanced COPD.  She understands this and wishes to defer endoscopic evaluation which I think is quite reasonable.  She understands that this may change if her anemia is refractory to iron replacement or if she has overt bleeding.  Hopefully this will not happen and we can discuss management in the future should this become a problem.  Fortunately her anemia is dramatically improving and she has had no overt bleeding.  Iron studies most recently normal. -- Continue iron every other day -- Monitor blood counts and iron stores with primary care -- Return GI visit in the event of refractory IDA or overt GI bleeding  2.  Chronic constipation --exacerbated by iron.  I recommended that she use MiraLAX 17 g daily.  She is having overflow diarrhea after several days of no bowel movement.  This should be correctable with daily MiraLAX. -- MiraLAX 17 g daily  3.  History of GERD and chronic steroid use --continue low-dose PPI daily -- Pantoprazole 20 mg daily  4.  B12 deficiency --B12 now normal -- Continue B12 1000 mcg daily  5.  Severe Gold D COPD --following closely with pulmonary  Follow-up as needed

## 2022-12-04 NOTE — Patient Instructions (Addendum)
Continue Pantoprazole daily.   Continue Miralax - 17 grams  dissolved in at least 8 ounces of water daily.   Continue Iron supplement every other day.    _______________________________________________________  If your blood pressure at your visit was 140/90 or greater, please contact your primary care physician to follow up on this.  _______________________________________________________  If you are age 73 or older, your body mass index should be between 23-30. Your Body mass index is 18.88 kg/m. If this is out of the aforementioned range listed, please consider follow up with your Primary Care Provider.  If you are age 56 or younger, your body mass index should be between 19-25. Your Body mass index is 18.88 kg/m. If this is out of the aformentioned range listed, please consider follow up with your Primary Care Provider.   ________________________________________________________  The [facility] providers would like to encourage you to use [facility] to communicate with providers for non-urgent requests or questions.  Due to long hold times on the telephone, sending your provider a message by [facility] may be a faster and more efficient way to get a response.  Please allow 48 business hours for a response.  Please remember that this is for non-urgent requests.  _______________________________________________________  Thank you for choosing me and [facility].  Dr.Jay Pyrtle

## 2022-12-13 ENCOUNTER — Ambulatory Visit (HOSPITAL_BASED_OUTPATIENT_CLINIC_OR_DEPARTMENT_OTHER)
Admission: RE | Admit: 2022-12-13 | Discharge: 2022-12-13 | Disposition: A | Discharge status: Home / Self Care | Payer: Medicare HMO | Source: Ambulatory Visit | Attending: Cardiology | Admitting: Cardiology

## 2022-12-13 DIAGNOSIS — R072 Precordial pain: Secondary | ICD-10-CM

## 2022-12-13 DIAGNOSIS — R0609 Other forms of dyspnea: Secondary | ICD-10-CM

## 2022-12-13 LAB — ECHOCARDIOGRAM COMPLETE
AR max vel: 1.71 cm2
AV Area VTI: 1.35 cm2
AV Area mean vel: 1.28 cm2
AV Mean grad: 8 mmHg
AV Peak grad: 15.5 mmHg
Ao pk vel: 1.97 m/s
Area-P 1/2: 3.36 cm2
P 1/2 time: 376 msec
S' Lateral: 2.2 cm

## 2022-12-17 ENCOUNTER — Telehealth: Payer: Self-pay | Admitting: Emergency Medicine

## 2022-12-17 NOTE — Telephone Encounter (Signed)
Copied from [id number]. Topic: Quick Communication - Home Health Verbal Orders >> Dec 17, 2022  3:57 PM Cyndi Bender wrote: Caller/Agency: Gracie with [facility]  Callback Number: [phone] Requesting OT/PT/Skilled Nursing/Social Work/Speech Therapy: PT re-certification and Home Health Aid Frequency: PT= 1 x 9 weeks    Home Health Aid= 1 x every 2 weeks for 8 weeks

## 2022-12-17 NOTE — Telephone Encounter (Signed)
Copied from [place] [id number]. Topic: Quick Communication - Home Health Verbal Orders >> Dec 17, 2022  3:57 PM Cyndi Bender wrote: Caller/Agency: Gracie with [facility]  Callback Number: [phone] Requesting OT/PT/Skilled Nursing/Social Work/Speech Therapy: PT re-certification and Home Health Aid Frequency: PT= 1 x 9 weeks    Home Health Aid= 1 x every 2 weeks for 8 weeks

## 2022-12-18 ENCOUNTER — Other Ambulatory Visit: Payer: Self-pay | Admitting: Pulmonary Disease

## 2022-12-18 ENCOUNTER — Other Ambulatory Visit: Payer: Self-pay | Admitting: Cardiology

## 2022-12-18 ENCOUNTER — Other Ambulatory Visit: Payer: Self-pay | Admitting: Family Medicine

## 2022-12-18 DIAGNOSIS — J449 Chronic obstructive pulmonary disease, unspecified: Secondary | ICD-10-CM

## 2022-12-18 NOTE — Telephone Encounter (Signed)
Okay to give verbal? 

## 2022-12-19 ENCOUNTER — Telehealth: Payer: Self-pay | Admitting: Family Medicine

## 2022-12-19 NOTE — Telephone Encounter (Signed)
Home Health Verbal Orders - Caller/Agency: [place] Number: [phone] Requesting : OT evaluation needed.  Frequency: Home Health Aid: would like to change it from 1 x every two weeks for 8 weeks. New order: 1 x every week for 8 weeks.

## 2022-12-19 NOTE — Telephone Encounter (Signed)
Attempted to contact Gracie and VM was left informing her to return phone call for verbal orders.

## 2022-12-20 DIAGNOSIS — E2740 Unspecified adrenocortical insufficiency: Secondary | ICD-10-CM | POA: Diagnosis not present

## 2022-12-22 ENCOUNTER — Other Ambulatory Visit: Payer: Self-pay | Admitting: Pulmonary Disease

## 2022-12-24 ENCOUNTER — Other Ambulatory Visit (HOSPITAL_BASED_OUTPATIENT_CLINIC_OR_DEPARTMENT_OTHER): Payer: Self-pay

## 2022-12-24 ENCOUNTER — Other Ambulatory Visit: Payer: Self-pay | Admitting: Family Medicine

## 2022-12-24 DIAGNOSIS — M545 Low back pain, unspecified: Secondary | ICD-10-CM

## 2022-12-24 MED ORDER — AZITHROMYCIN 250 MG PO TABS
250.0000 mg | ORAL_TABLET | Freq: Every day | ORAL | 5 refills | Status: DC
  Filled 2022-12-24: qty 30, 30d supply, fill #0
  Filled 2023-02-20: qty 30, 30d supply, fill #1
  Filled 2023-05-13: qty 30, 30d supply, fill #2
  Filled 2023-08-19: qty 30, 30d supply, fill #3
  Filled 2023-11-15: qty 30, 30d supply, fill #4

## 2022-12-24 MED ORDER — MELOXICAM 7.5 MG PO TABS
7.5000 mg | ORAL_TABLET | Freq: Every day | ORAL | 0 refills | Status: DC
  Filled 2022-12-24: qty 90, 90d supply, fill #0

## 2022-12-25 ENCOUNTER — Other Ambulatory Visit (HOSPITAL_BASED_OUTPATIENT_CLINIC_OR_DEPARTMENT_OTHER): Payer: Self-pay

## 2022-12-25 ENCOUNTER — Other Ambulatory Visit: Payer: Self-pay

## 2022-12-25 DIAGNOSIS — E2740 Unspecified adrenocortical insufficiency: Secondary | ICD-10-CM | POA: Diagnosis not present

## 2022-12-26 ENCOUNTER — Other Ambulatory Visit (HOSPITAL_BASED_OUTPATIENT_CLINIC_OR_DEPARTMENT_OTHER): Payer: Self-pay

## 2023-01-03 ENCOUNTER — Encounter: Payer: Self-pay | Admitting: Family Medicine

## 2023-01-03 ENCOUNTER — Other Ambulatory Visit (HOSPITAL_BASED_OUTPATIENT_CLINIC_OR_DEPARTMENT_OTHER): Payer: Self-pay

## 2023-01-03 ENCOUNTER — Ambulatory Visit: Payer: Medicare HMO | Attending: Family Medicine | Admitting: Family Medicine

## 2023-01-03 VITALS — BP 154/74 | HR 89 | Ht 64.0 in | Wt 109.0 lb

## 2023-01-03 DIAGNOSIS — M545 Low back pain, unspecified: Secondary | ICD-10-CM

## 2023-01-03 DIAGNOSIS — B351 Tinea unguium: Secondary | ICD-10-CM | POA: Diagnosis not present

## 2023-01-03 DIAGNOSIS — R252 Cramp and spasm: Secondary | ICD-10-CM | POA: Diagnosis not present

## 2023-01-03 DIAGNOSIS — R030 Elevated blood-pressure reading, without diagnosis of hypertension: Secondary | ICD-10-CM | POA: Diagnosis not present

## 2023-01-03 DIAGNOSIS — J449 Chronic obstructive pulmonary disease, unspecified: Secondary | ICD-10-CM

## 2023-01-03 DIAGNOSIS — D649 Anemia, unspecified: Secondary | ICD-10-CM

## 2023-01-03 DIAGNOSIS — M810 Age-related osteoporosis without current pathological fracture: Secondary | ICD-10-CM | POA: Diagnosis not present

## 2023-01-03 DIAGNOSIS — L03011 Cellulitis of right finger: Secondary | ICD-10-CM

## 2023-01-03 DIAGNOSIS — J3089 Other allergic rhinitis: Secondary | ICD-10-CM | POA: Diagnosis not present

## 2023-01-03 MED ORDER — KETOROLAC TROMETHAMINE 60 MG/2ML IM SOLN
60.0000 mg | Freq: Once | INTRAMUSCULAR | Status: AC
  Administered 2023-01-03: 60 mg via INTRAMUSCULAR

## 2023-01-03 MED ORDER — LORATADINE 10 MG PO TABS
10.0000 mg | ORAL_TABLET | Freq: Every day | ORAL | 1 refills | Status: DC
  Filled 2023-01-03: qty 100, 90d supply, fill #0
  Filled 2023-05-13: qty 100, 100d supply, fill #1

## 2023-01-03 MED ORDER — ALENDRONATE SODIUM 70 MG PO TABS
70.0000 mg | ORAL_TABLET | ORAL | 1 refills | Status: DC
  Filled 2023-01-03: qty 12, 84d supply, fill #0

## 2023-01-03 MED ORDER — TERBINAFINE HCL 250 MG PO TABS
250.0000 mg | ORAL_TABLET | Freq: Every day | ORAL | 0 refills | Status: DC
  Filled 2023-01-03: qty 90, 90d supply, fill #0
  Filled 2023-01-03: qty 70, 70d supply, fill #0
  Filled 2023-01-03: qty 20, 20d supply, fill #0

## 2023-01-03 MED ORDER — CEPHALEXIN 500 MG PO CAPS
500.0000 mg | ORAL_CAPSULE | Freq: Two times a day (BID) | ORAL | 0 refills | Status: DC
  Filled 2023-01-03: qty 14, 7d supply, fill #0

## 2023-01-03 MED ORDER — FLUTICASONE PROPIONATE 50 MCG/ACT NA SUSP
2.0000 | Freq: Every day | NASAL | 6 refills | Status: DC
  Filled 2023-01-03: qty 16, 30d supply, fill #0

## 2023-01-03 NOTE — Progress Notes (Signed)
Subjective:  Patient ID: Margaret Henry, female    DOB: [birth year]  Age: 72 y.o. MRN: [id number]  CC: COPD   HPI Margaret Henry is a 72 y.o. 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  Interval History:  She had a visit with [facility] endocrinology for evaluation for adrenal insufficiency due to low cortisol level and cosyntropin stimulation test came back negative.  Low cortisol level was thought to be abnormal due to nonfasting specimen drawn and the fact that she had been on chronic prednisone prior to labs being drawn.   Evaluated by GI last month for iron deficiency anemia with recommendation for endoscopy but per notes patient wishes to defer at this due to current medical conditions and her high risk status for procedures.  She remains on iron supplementation. Seen by cardiology last month as well.  She Complains of cramps in her legs which feel like charlie horses and cramping occur in the arch of her feet. Back pain is at a 4/10. Unable to tolerate Mobic but she receives Tramadol from Dr Laurance Flatten her orthopedic and would like to receive a Toradol shot today as it was not effective in the past. She complains of right ring finger cuticle discoloration and slight tenderness.  Would also like to receive a refill of terbinafine which she received in the past for onychomycosis. 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)   Anal intraepithelial neoplasia II (AIN II) 2017   Anemia 04/06/22   Aortic atherosclerosis (HCC)    B12 deficiency    Blood transfusion without reported diagnosis 6/23   Bronchitis    Cancer ([place])    skin cancer on chest   Cataract 5/23   Chest pain 06/12/2016   Cholelithiasis    Colon polyps    Complication  of anesthesia 05/16/2021   pt states was given too much during nasal surgery 1989; difficulty getting awake   COPD (chronic obstructive pulmonary disease) ([place])    Coronary artery calcification    Diverticulitis 05/16/2021   occasional   Diverticulosis    Dyspnea 06/27/2016   Emphysema of lung (HCC)    External hemorrhoids    GERD (gastroesophageal reflux disease)    occasional   Hemorrhoids    High cholesterol    Hyperlipidemia LDL goal <70    Iron deficiency    Mild aortic insufficiency    Neuropathy 04/07/2022   Osteoporosis    Oxygen deficiency    Pneumonia    Shortness of breath dyspnea    with exertion    Thyroid goiter    bx benign   Tobacco abuse    Vertigo 04/21/2017    Past Surgical History:  Procedure Laterality Date   ABDOMINAL HYSTERECTOMY     APPENDECTOMY  1973   BILATERAL OOPHORECTOMY  10/09/1999   for benign ovarian mass    BIOPSY THYROID     DENTAL SURGERY     dentures   NASAL SEPTUM SURGERY     PARTIAL HYSTERECTOMY  10/08/1974   for heavy menses    RECTAL EXAM UNDER ANESTHESIA N/A 12/14/2015   Procedure: RECTAL EXAM UNDER ANESTHESIA REMOVAL OF ANAL CANAL MASS; INTERNAL HEMORRHOID LIGATION, EXTERNAL HEMORRHOID LIGATION;  Surgeon: Michael Boston, MD;  Location:  [facility];  Service: General;  Laterality: N/A;   TUBAL LIGATION     WISDOM TOOTH EXTRACTION      Family History  Problem Relation Age of Onset   Heart disease Mother    Alcohol abuse Mother    Asthma Mother    COPD Father    Heart disease Father    Lung cancer Maternal Grandfather    Cancer Maternal Grandfather    Liver cancer Paternal Grandmother    Cancer Paternal Grandmother    Diabetes Paternal Aunt    Colon cancer Neg Hx    Colon polyps Neg Hx    Esophageal cancer Neg Hx    Rectal cancer Neg Hx    Stomach cancer Neg Hx     Social History   Socioeconomic History   Marital status: Widowed    Spouse name: Not on file   Number of children: Not on file   Years of education:  Not on file   Highest education level: 9th grade  Occupational History   Not on file  Tobacco Use   Smoking status: Former    Packs/day: 1.50    Years: 50.00    Additional pack years: 0.00    Total pack years: 75.00    Types: Cigarettes    Quit date: 11/03/2018    Years since quitting: 4.1   Smokeless tobacco: Never  Vaping Use   Vaping Use: Never used  Substance and Sexual Activity   Alcohol use: No   Drug use: No   Sexual activity: Never  Other Topics Concern   Not on file  Social History Narrative   Not on file   Social Determinants of Health   Financial Resource Strain: Low Risk  (01/01/2023)   Overall Financial Resource Strain (CARDIA)    Difficulty of Paying Living Expenses: Not hard at all  Food Insecurity: No Food Insecurity (01/01/2023)   Hunger Vital Sign    Worried About Running Out of Food in the Last Year: Never true    Ran Out of Food in the Last Year: Never true  Transportation Needs: No Transportation Needs (01/01/2023)   PRAPARE - Hydrologist (Medical): No    Lack of Transportation (Non-Medical): No  Physical Activity: Insufficiently Active (01/01/2023)   Exercise Vital Sign    Days of Exercise per Week: 3 days    Minutes of Exercise per Session: 30 min  Stress: No Stress Concern Present (01/01/2023)   [place]    Feeling of Stress : Not at all  Social Connections: Socially Isolated (01/01/2023)   Social Connection and Isolation Panel [NHANES]    Frequency of Communication with Friends and Family: More than three times a week    Frequency of Social Gatherings with Friends and Family: Twice a week    Attends Religious Services: Never    Marine scientist or Organizations: No    Attends Archivist Meetings: Never    Marital Status: Widowed    Allergies  Allergen Reactions   Hydrocodone Nausea And Vomiting   Propoxyphene N-Acetaminophen  Nausea And Vomiting    Outpatient Medications Prior to Visit  Medication Sig Dispense Refill   atorvastatin (LIPITOR) 40 MG tablet TAKE 1 TABLET EVERY DAY 90 tablet 3   azithromycin (ZITHROMAX) 250 MG tablet Take 1 tablet (250 mg total) by mouth daily. 30 tablet 5   benzonatate (TESSALON) 200 MG capsule Take 1 capsule (200 mg  total) by mouth 3 (three) times daily as needed for cough. 30 capsule 1   bismuth subsalicylate (PEPTO BISMOL) 262 MG/15ML suspension Take 30 mLs by mouth every 6 (six) hours as needed for indigestion.     BREO ELLIPTA 200-25 MCG/ACT AEPB INHALE 1 PUFF EVERY DAY 180 each 3   ferrous sulfate 325 (65 FE) MG EC tablet Take 325 mg by mouth every other day.     ipratropium-albuterol (DUONEB) 0.5-2.5 (3) MG/3ML SOLN INHALE THE CONTENTS OF 1 VIAL VIA NEBULIZER EVERY 6 HOURS AS NEEDED 180 mL 0   mupirocin ointment (BACTROBAN) 2 % Apply 1 Application topically 2 (two) times daily. To affected nostril (Patient taking differently: Place 1 Application into the nose See admin instructions. Apply to affected (inner) nostril(s) one to two times a day) 22 g 0   nitroGLYCERIN (NITROSTAT) 0.4 MG SL tablet Place 1 tablet (0.4 mg total) under the tongue every 5 (five) minutes as needed for chest pain. 25 tablet 6   OXYGEN Inhale 3 L/min into the lungs continuous.     pantoprazole (PROTONIX) 20 MG tablet Take 1 tablet (20 mg total) by mouth daily. 90 tablet 2   Tiotropium Bromide Monohydrate (SPIRIVA RESPIMAT) 2.5 MCG/ACT AERS INHALE 2 PUFFS EVERY DAY (Patient taking differently: Take 2 each by mouth daily.) 12 g 3   vitamin B-12 (CYANOCOBALAMIN) 100 MCG tablet Take 100 mcg by mouth daily.     Vitamin D, Ergocalciferol, (DRISDOL) 1.25 MG (50000 UNIT) CAPS capsule Take 50,000 Units by mouth every 7 (seven) days.     alendronate (FOSAMAX) 70 MG tablet Take 1 tablet (70 mg total) by mouth every 7 (seven) days. Take with a full glass of water on an empty stomach. (Patient taking differently: Take 70  mg by mouth every Sunday. Take with a full glass of water on an empty stomach.) 4 tablet 6   fluticasone (FLONASE) 50 MCG/ACT nasal spray USE 2 SPRAYS IN EACH NOSTRIL EVERY DAY (Patient taking differently: Place 2 sprays into both nostrils daily as needed for allergies.) 16 g 6   loratadine (CLARITIN) 10 MG tablet Take 1 tablet (10 mg total) by mouth daily. 100 tablet 1   meloxicam (MOBIC) 7.5 MG tablet Take 1 tablet (7.5 mg total) by mouth daily. 90 tablet 0   Vitamin D, Ergocalciferol, (DRISDOL) 1.25 MG (50000 UNIT) CAPS capsule Take 1 capsule (50,000 Units total) by mouth every 7 (seven) days. (Patient taking differently: Take 50,000 Units by mouth every Sunday.) 4 capsule 2   No facility-administered medications prior to visit.     ROS Review of Systems  Constitutional:  Negative for activity change and appetite change.  HENT:  Positive for rhinorrhea. Negative for sinus pressure and sore throat.   Respiratory:  Negative for chest tightness, shortness of breath and wheezing.   Cardiovascular:  Negative for chest pain and palpitations.  Gastrointestinal:  Negative for abdominal distention, abdominal pain and constipation.  Genitourinary: Negative.   Musculoskeletal:        See HPI  Psychiatric/Behavioral:  Negative for behavioral problems and dysphoric mood.     Objective:  BP (!) 154/74   Pulse 89   Ht 5\' 4"  (1.626 m)   Wt 109 lb (49.4 kg)   SpO2 92%   BMI 18.71 kg/m      01/03/2023   11:49 AM 01/03/2023   11:08 AM 12/04/2022    9:45 AM  BP/Weight  Systolic BP [id number]  Diastolic BP 74 66 64  Wt. (Lbs)  109 110  BMI  18.71 kg/m2 18.88 kg/m2      Physical Exam Constitutional:      Appearance: She is well-developed.  Cardiovascular:     Rate and Rhythm: Normal rate.     Heart sounds: Normal heart sounds. No murmur heard. Pulmonary:     Effort: Pulmonary effort is normal.     Breath sounds: Normal breath sounds. No wheezing or rales.  Chest:     Chest wall: No  tenderness.  Abdominal:     General: Bowel sounds are normal. There is no distension.     Palpations: Abdomen is soft. There is no mass.     Tenderness: There is no abdominal tenderness.  Musculoskeletal:        General: Normal range of motion.     Right lower leg: No edema.     Left lower leg: No edema.  Skin:    Comments: Slight erythema of cuticle of right ring finger with associated tenderness to palpation.  No edema  Neurological:     Mental Status: She is alert and oriented to person, place, and time.  Psychiatric:        Mood and Affect: Mood normal.        Latest Ref Rng & Units 11/26/2022    3:10 PM 09/27/2022   11:51 AM 09/17/2022    5:18 AM  CMP  Glucose 70 - 99 mg/dL 94  88  80   BUN 6 - 23 mg/dL 12  11  21    Creatinine 0.40 - 1.20 mg/dL 0.57  0.50  0.56   Sodium 135 - 145 mEq/L 140  144  142   Potassium 3.5 - 5.1 mEq/L 4.1  4.0  3.7   Chloride 96 - 112 mEq/L 105  107  109   CO2 19 - 32 mEq/L 28  28  28    Calcium 8.4 - 10.5 mg/dL 9.9  9.6  8.9   Total Protein 6.0 - 8.3 g/dL 6.8  6.6    Total Bilirubin 0.2 - 1.2 mg/dL 0.4  0.3    Alkaline Phos 39 - 117 U/L 86  139    AST 0 - 37 U/L 12  12    ALT 0 - 35 U/L 6  9      Lipid Panel     Component Value Date/Time   CHOL 143 01/30/2022 1158   CHOL 147 02/15/2021 1139   TRIG 132.0 01/30/2022 1158   HDL 74.30 01/30/2022 1158   HDL 58 02/15/2021 1139   CHOLHDL 2 01/30/2022 1158   VLDL 26.4 01/30/2022 1158   LDLCALC 42 01/30/2022 1158   LDLCALC 70 02/15/2021 1139   LDLDIRECT 166 (H) 09/26/2015 1001    CBC    Component Value Date/Time   WBC 7.6 11/26/2022 1510   RBC 3.72 (L) 11/26/2022 1510   HGB 11.6 (L) 11/26/2022 1510   HGB 11.5 04/17/2022 1552   HCT 35.2 (L) 11/26/2022 1510   HCT 38.7 04/17/2022 1552   PLT 294.0 11/26/2022 1510   PLT 423 04/17/2022 1552   MCV 94.6 11/26/2022 1510   MCV 85 04/17/2022 1552   MCH 31.6 09/17/2022 0518   MCHC 33.1 11/26/2022 1510   RDW 14.9 11/26/2022 1510   RDW 25.6  (H) 04/17/2022 1552   LYMPHSABS 1.4 11/26/2022 1510   LYMPHSABS 0.7 04/17/2022 1552   MONOABS 0.8 11/26/2022 1510   EOSABS 0.3 11/26/2022 1510   EOSABS 0.0 04/17/2022 1552   BASOSABS 0.1  11/26/2022 1510   BASOSABS 0.1 04/17/2022 1552    Lab Results  Component Value Date   HGBA1C 5.5 07/04/2022    Assessment & Plan:  1. COPD, group D, by GOLD 2017 classification ([place]) Stable Continue Spiriva, DuoNeb Managed by pulmonary  2. Non-seasonal allergic rhinitis due to other allergic trigger Ongoing rhinorrhea Will refill Flonase - loratadine (CLARITIN) 10 MG tablet; Take 1 tablet (10 mg total) by mouth daily.  Dispense: 100 tablet; Refill: 1 - fluticasone (FLONASE) 50 MCG/ACT nasal spray; Place 2 sprays into both nostrils daily.  Dispense: 16 g; Refill: 6  3. Muscle cramps Will need to check electrolytes especially potassium Stay hydrated - Basic Metabolic Panel  4. Lumbar spine pain Pain is minimal at a 4/10 She is requesting Toradol injection Continue tramadol per orthopedic Unable to tolerate Mobic - ketorolac (TORADOL) injection 60 mg  5. Anemia, unspecified type Last hemoglobin was 11.6 She denies plans of hematochezia Remains on iron supplements No endoscopic procedures per GI note due to high risk status - CBC with Differential/Platelet - Vitamin B12  6. Paronychia of finger of right hand - cephALEXin (KEFLEX) 500 MG capsule; Take 1 capsule (500 mg total) by mouth 2 (two) times daily.  Dispense: 14 capsule; Refill: 0  7. Osteoporosis, unspecified osteoporosis type, unspecified pathological fracture presence Stable - alendronate (FOSAMAX) 70 MG tablet; Take 1 tablet (70 mg total) by mouth every 7 (seven) days. Take with a full glass of water on an empty stomach.  Dispense: 12 tablet; Refill: 1  8. Onychomycosis Terbinafine prescribed  9. Elevated blood pressure reading Surprisingly at her last visit she was hypotensive a few months ago but over the last few  months she has had elevated blood pressure readings Will have her keep a home blood pressure log which I will review at her next visit and decide if antihypertensive initiation is indicated    Meds ordered this encounter  Medications   alendronate (FOSAMAX) 70 MG tablet    Sig: Take 1 tablet (70 mg total) by mouth every 7 (seven) days. Take with a full glass of water on an empty stomach.    Dispense:  12 tablet    Refill:  1   loratadine (CLARITIN) 10 MG tablet    Sig: Take 1 tablet (10 mg total) by mouth daily.    Dispense:  100 tablet    Refill:  1   terbinafine (LAMISIL) 250 MG tablet    Sig: Take 1 tablet (250 mg total) by mouth daily.    Dispense:  90 tablet    Refill:  0   fluticasone (FLONASE) 50 MCG/ACT nasal spray    Sig: Place 2 sprays into both nostrils daily.    Dispense:  16 g    Refill:  6   cephALEXin (KEFLEX) 500 MG capsule    Sig: Take 1 capsule (500 mg total) by mouth 2 (two) times daily.    Dispense:  14 capsule    Refill:  0   ketorolac (TORADOL) injection 60 mg    Follow-up: Return in about 1 month (around 02/03/2023) for Blood Pressure follow-up.       [name], MD, FAAFP. [facility] and [place], [place]   01/03/2023, 11:56 AM

## 2023-01-03 NOTE — Patient Instructions (Signed)
Managing Your Hypertension Hypertension, also called high blood pressure, is when the force of the blood pressing against the walls of the arteries is too strong. Arteries are blood vessels that carry blood from your heart throughout your body. Hypertension forces the heart to work harder to pump blood and may cause the arteries to become narrow or stiff. Understanding blood pressure readings A blood pressure reading includes a higher number over a lower number: The first, or top, number is called the systolic pressure. It is a measure of the pressure in your arteries as your heart beats. The second, or bottom number, is called the diastolic pressure. It is a measure of the pressure in your arteries as the heart relaxes. For most people, a normal blood pressure is below 120/80. Your personal target blood pressure may vary depending on your medical conditions, your age, and other factors. Blood pressure is classified into four stages. Based on your blood pressure reading, your health care provider may use the following stages to determine what type of treatment you need, if any. Systolic pressure and diastolic pressure are measured in a unit called millimeters of mercury (mmHg). Normal Systolic pressure: below 120. Diastolic pressure: below 80. Elevated Systolic pressure: 120-129. Diastolic pressure: below 80. Hypertension stage 1 Systolic pressure: 130-139. Diastolic pressure: 80-89. Hypertension stage 2 Systolic pressure: 140 or above. Diastolic pressure: 90 or above. How can this condition affect me? Managing your hypertension is very important. Over time, hypertension can damage the arteries and decrease blood flow to parts of the body, including the brain, heart, and kidneys. Having untreated or uncontrolled hypertension can lead to: A heart attack. A stroke. A weakened blood vessel (aneurysm). Heart failure. Kidney damage. Eye damage. Memory and concentration problems. Vascular  dementia. What actions can I take to manage this condition? Hypertension can be managed by making lifestyle changes and possibly by taking medicines. Your health care provider will help you make a plan to bring your blood pressure within a normal range. You may be referred for counseling on a healthy diet and physical activity. Nutrition  Eat a diet that is high in fiber and potassium, and low in salt (sodium), added sugar, and fat. An example eating plan is called the DASH diet. DASH stands for Dietary Approaches to Stop Hypertension. To eat this way: Eat plenty of fresh fruits and vegetables. Try to fill one-half of your plate at each meal with fruits and vegetables. Eat whole grains, such as whole-wheat pasta, brown rice, or whole-grain bread. Fill about one-fourth of your plate with whole grains. Eat low-fat dairy products. Avoid fatty cuts of meat, processed or cured meats, and poultry with skin. Fill about one-fourth of your plate with lean proteins such as fish, chicken without skin, beans, eggs, and tofu. Avoid pre-made and processed foods. These tend to be higher in sodium, added sugar, and fat. Reduce your daily sodium intake. Many people with hypertension should eat less than 1,500 mg of sodium a day. Lifestyle  Work with your health care provider to maintain a healthy body weight or to lose weight. Ask what an ideal weight is for you. Get at least 30 minutes of exercise that causes your heart to beat faster (aerobic exercise) most days of the week. Activities may include walking, swimming, or biking. Include exercise to strengthen your muscles (resistance exercise), such as weight lifting, as part of your weekly exercise routine. Try to do these types of exercises for 30 minutes at least 3 days a week. Do   not use any products that contain nicotine or tobacco. These products include cigarettes, chewing tobacco, and vaping devices, such as e-cigarettes. If you need help quitting, ask your  health care provider. Control any long-term (chronic) conditions you have, such as high cholesterol or diabetes. Identify your sources of stress and find ways to manage stress. This may include meditation, deep breathing, or making time for fun activities. Alcohol use Do not drink alcohol if: Your health care provider tells you not to drink. You are pregnant, may be pregnant, or are planning to become pregnant. If you drink alcohol: Limit how much you have to: 0-1 drink a day for women. 0-2 drinks a day for men. Know how much alcohol is in your drink. In the [place] one drink equals one 12 oz bottle of beer (355 mL), one 5 oz glass of wine (148 mL), or one 1 oz glass of hard liquor (44 mL). Medicines Your health care provider may prescribe medicine if lifestyle changes are not enough to get your blood pressure under control and if: Your systolic blood pressure is 130 or higher. Your diastolic blood pressure is 80 or higher. Take medicines only as told by your health care provider. Follow the directions carefully. Blood pressure medicines must be taken as told by your health care provider. The medicine does not work as well when you skip doses. Skipping doses also puts you at risk for problems. Monitoring Before you monitor your blood pressure: Do not smoke, drink caffeinated beverages, or exercise within 30 minutes before taking a measurement. Use the bathroom and empty your bladder (urinate). Sit quietly for at least 5 minutes before taking measurements. Monitor your blood pressure at home as told by your health care provider. To do this: Sit with your back straight and supported. Place your feet flat on the floor. Do not cross your legs. Support your arm on a flat surface, such as a table. Make sure your upper arm is at heart level. Each time you measure, take two or three readings one minute apart and record the results. You may also need to have your blood pressure checked regularly by  your health care provider. General information Talk with your health care provider about your diet, exercise habits, and other lifestyle factors that may be contributing to hypertension. Review all the medicines you take with your health care provider because there may be side effects or interactions. Keep all follow-up visits. Your health care provider can help you create and adjust your plan for managing your high blood pressure. Where to find more information National Heart, Lung, and Blood Institute: www.nhlbi.nih.gov American Heart Association: www.heart.org Contact a health care provider if: You think you are having a reaction to medicines you have taken. You have repeated (recurrent) headaches. You feel dizzy. You have swelling in your ankles. You have trouble with your vision. Get help right away if: You develop a severe headache or confusion. You have unusual weakness or numbness, or you feel faint. You have severe pain in your chest or abdomen. You vomit repeatedly. You have trouble breathing. These symptoms may be an emergency. Get help right away. Call 911. Do not wait to see if the symptoms will go away. Do not drive yourself to the hospital. Summary Hypertension is when the force of blood pumping through your arteries is too strong. If this condition is not controlled, it may put you at risk for serious complications. Your personal target blood pressure may vary depending on your medical conditions,   your age, and other factors. For most people, a normal blood pressure is less than 120/80. Hypertension is managed by lifestyle changes, medicines, or both. Lifestyle changes to help manage hypertension include losing weight, eating a healthy, low-sodium diet, exercising more, stopping smoking, and limiting alcohol. This information is not intended to replace advice given to you by your health care provider. Make sure you discuss any questions you have with your health care  provider. Document Revised: 06/08/2021 Document Reviewed: 06/08/2021 Elsevier Patient Education  2023 [employer]  

## 2023-01-05 LAB — BASIC METABOLIC PANEL
BUN/Creatinine Ratio: 23 (ref 12–28)
BUN: 14 mg/dL (ref 8–27)
CO2: 24 mmol/L (ref 20–29)
Calcium: 9.8 mg/dL (ref 8.7–10.3)
Chloride: 104 mmol/L (ref 96–106)
Creatinine, Ser: 0.6 mg/dL (ref 0.57–1.00)
Glucose: 84 mg/dL (ref 70–99)
Potassium: 5 mmol/L (ref 3.5–5.2)
Sodium: 142 mmol/L (ref 134–144)
eGFR: 95 mL/min/{1.73_m2} (ref >59)

## 2023-01-05 LAB — CBC WITH DIFFERENTIAL/PLATELET
Basophils Absolute: 0.1 10*3/uL (ref 0.0–0.2)
Basos: 1 %
EOS (ABSOLUTE): 0.3 10*3/uL (ref 0.0–0.4)
Eos: 4 %
Hematocrit: 37 % (ref 34.0–46.6)
Hemoglobin: 12.1 g/dL (ref 11.1–15.9)
Immature Grans (Abs): 0 10*3/uL (ref 0.0–0.1)
Immature Granulocytes: 0 %
Lymphocytes Absolute: 1.3 10*3/uL (ref 0.7–3.1)
Lymphs: 19 %
MCH: 31.8 pg (ref 26.6–33.0)
MCHC: 32.7 g/dL (ref 31.5–35.7)
MCV: 97 fL (ref 79–97)
Monocytes Absolute: 0.7 10*3/uL (ref 0.1–0.9)
Monocytes: 10 %
Neutrophils Absolute: 4.5 10*3/uL (ref 1.4–7.0)
Neutrophils: 66 %
Platelets: 271 10*3/uL (ref 150–450)
RBC: 3.81 x10E6/uL (ref 3.77–5.28)
RDW: 13.1 % (ref 11.7–15.4)
WBC: 6.9 10*3/uL (ref 3.4–10.8)

## 2023-01-05 LAB — VITAMIN B12: Vitamin B-12: 633 pg/mL (ref 232–1245)

## 2023-01-07 ENCOUNTER — Other Ambulatory Visit: Payer: Self-pay

## 2023-01-07 ENCOUNTER — Ambulatory Visit (INDEPENDENT_AMBULATORY_CARE_PROVIDER_SITE_OTHER): Payer: Medicare HMO | Admitting: Orthopedic Surgery

## 2023-01-07 DIAGNOSIS — S32030G Wedge compression fracture of third lumbar vertebra, subsequent encounter for fracture with delayed healing: Secondary | ICD-10-CM | POA: Diagnosis not present

## 2023-01-07 NOTE — Progress Notes (Signed)
Orthopedic Spine Surgery Office Note   Assessment: Patient is a 72 y.o. female with back pain and multiple compression fractures.  Pain continues to improve     Plan: -No acute operative intervention -Out of bed as tolerated -Gradually return to activity as tolerated -Continue with osteoporosis treatment -Recommended continue with tylenol and topical cream since that has been helping of late.  She can continue to use the new brace that she bought periodically as that helps her as well. -Suggested 71-month follow-up patient wanted to do 51-month.  Will plan to see her back at 6 months, XR at next visit: AP/lateral lumbar     Patient expressed understanding of the plan and all questions were answered to the patient's satisfaction.    ___________________________________________________________________________   History: Patient is a 72 y.o. female who has been previously seen in the office for low back which was consistent with compression fractures.  She states her pain has gotten better since the last time I saw her.  She is doing more activity with physical therapy.  She states she is walking longer distances and is doing more without the walker.  She has found a new back brace that has a mechanism to provide heat or ice through it that has been helpful.  She uses this periodically when her pain is really bad.  She feels that her pain is okay in the morning but she gets sore as the day goes on.  No pain radiating into the lower extremities.  Long history of urinary incontinence.  No recent changes in bowel or bladder habits.  No saddle anesthesia.  Previous treatments: Tramadol, Tylenol, activity modification, topical medication, toradol injection, TLSO brace, PT    Physical Exam:   General: no acute distress, appears stated age Neurologic: alert, answering questions appropriately, following commands Respiratory: unlabored breathing on room air, symmetric chest rise Psychiatric: appropriate  affect, normal cadence to speech     MSK (spine):   -Strength exam                                                   Left                  Right EHL                              5/5                  5/5 TA                                 5/5                  5/5 GSC                             5/5                  5/5 Knee extension            5/5                  5/5 Hip flexion  5/5                  5/5   -Sensory exam                           Sensation intact to light touch in L3-S1 nerve distributions of bilateral lower extremities   -Achilles DTR: 1/4 on the left, 1/4 on the right -Patellar tendon DTR: 1/4 on the left, 1/4 on the right   -Able to perform heel to shin testing bilaterally  -Clonus: no beats bilaterally   Imaging: XR of the lumbar spine from 01/07/2023 was independently reviewed and interpreted, showing L1, L3, and L5 compression fractures.  Ribs and lung fields obscured the view but there appears to be a T11 compression fracture as well.  No significant change in height loss since her last films from office.  No new fracture seen.   MRI of the lumbar spine from 11/26/2022 was previously independently reviewed and interpreted, showing disc herniation at T10/11 with cranial migration behind the T10 body. No T2 cord signal change at the level of the disc herniation. Compression fractures at T11, L1, L3, and L5.      Patient name: Margaret Henry Patient MRN: [id number] Date of visit: 01/07/23

## 2023-01-22 ENCOUNTER — Other Ambulatory Visit: Payer: Self-pay | Admitting: Orthopedic Surgery

## 2023-01-22 ENCOUNTER — Ambulatory Visit: Payer: Self-pay

## 2023-01-22 ENCOUNTER — Other Ambulatory Visit (HOSPITAL_BASED_OUTPATIENT_CLINIC_OR_DEPARTMENT_OTHER): Payer: Self-pay

## 2023-01-22 MED ORDER — TRAMADOL HCL 50 MG PO TABS
50.0000 mg | ORAL_TABLET | Freq: Four times a day (QID) | ORAL | 0 refills | Status: DC | PRN
  Filled 2023-01-22: qty 20, 5d supply, fill #0

## 2023-01-22 NOTE — Patient Instructions (Signed)
Visit Information  Thank you for taking time to visit with me today. Please don't hesitate to contact me if I can be of assistance to you.   Following are the goals we discussed today:  -Contact your primary care provider as needed   If you are experiencing a Mental Health or Behavioral Health Crisis or need someone to talk to, please call 911  Patient verbalizes understanding of instructions and care plan provided today and agrees to view in MyChart. Active MyChart status and patient understanding of how to access instructions and care plan via MyChart confirmed with patient.     No further follow up required: Please contact me as needed.  Kemiya Batdorf, BSW, CDP Social Worker, Certified Dementia Practitioner [facility]  Care Coordination [phone]          

## 2023-01-22 NOTE — Patient Outreach (Signed)
  Care Coordination   Initial Visit Note   01/22/2023 Name: Margaret Henry MRN: [id number] DOB: Jan 22, 1951  Margaret Henry is a 72 y.o. year old female who sees Margaret Register, MD for primary care. I spoke with  Margaret Henry by phone today.  What matters to the patients health and wellness today?  No concerns identified during today's call    Goals Addressed             This Visit's Progress    COMPLETED: Care Coordination Activities       Care Coordination Interventions: SDoH screening performed - no acute resource challenges identified at this time Determined the patient does not have concerns with medication costs and/or adherence at this time Education provided on the role of the care coordination team - patient declines follow up at this time Encouraged the patient to contact her primary care provider as needed         SDOH assessments and interventions completed:  Yes  SDOH Interventions Today    Flowsheet Row Most Recent Value  SDOH Interventions   Food Insecurity Interventions Intervention Not Indicated  Housing Interventions Intervention Not Indicated  Transportation Interventions Intervention Not Indicated  Utilities Interventions Intervention Not Indicated        Care Coordination Interventions:  Yes, provided   Interventions Today    Flowsheet Row Most Recent Value  Chronic Disease   Chronic disease during today's visit Chronic Obstructive Pulmonary Disease (COPD)  General Interventions   General Interventions Discussed/Reviewed General Interventions Discussed, Doctor Visits  Doctor Visits Discussed/Reviewed Doctor Visits Reviewed  Education Interventions   Education Provided Provided Education  Provided Verbal Education On Other  [role of care coordination team]        Follow up plan: No further intervention required.   Encounter Outcome:  Pt. Visit Completed   Bevelyn Ngo, BSW, CDP Social Worker, Certified Dementia Practitioner [facility]  Care Management  Care Coordination [phone]

## 2023-02-06 ENCOUNTER — Other Ambulatory Visit (HOSPITAL_BASED_OUTPATIENT_CLINIC_OR_DEPARTMENT_OTHER): Payer: Self-pay

## 2023-02-06 ENCOUNTER — Encounter: Payer: Self-pay | Admitting: Family Medicine

## 2023-02-06 ENCOUNTER — Ambulatory Visit: Payer: Medicare HMO | Attending: Family Medicine | Admitting: Family Medicine

## 2023-02-06 ENCOUNTER — Other Ambulatory Visit: Payer: Self-pay | Admitting: Orthopedic Surgery

## 2023-02-06 VITALS — BP 134/66 | HR 99

## 2023-02-06 DIAGNOSIS — M25561 Pain in right knee: Secondary | ICD-10-CM | POA: Diagnosis not present

## 2023-02-06 DIAGNOSIS — L03011 Cellulitis of right finger: Secondary | ICD-10-CM

## 2023-02-06 MED ORDER — PREDNISONE 20 MG PO TABS
20.0000 mg | ORAL_TABLET | Freq: Every day | ORAL | 0 refills | Status: DC
  Filled 2023-02-06: qty 5, 5d supply, fill #0

## 2023-02-06 MED ORDER — PREDNISONE 20 MG PO TABS: 20.0000 mg | ORAL_TABLET | Freq: Every day | ORAL | 0 refills | Status: DC

## 2023-02-06 MED ORDER — TRAMADOL HCL 50 MG PO TABS
50.0000 mg | ORAL_TABLET | Freq: Four times a day (QID) | ORAL | 0 refills | Status: DC | PRN
  Filled 2023-02-06: qty 20, 5d supply, fill #0

## 2023-02-06 NOTE — Progress Notes (Signed)
Subjective:  Patient ID: Margaret Henry, female    DOB: 1951/09/20  Age: 72 y.o. MRN: [id number]  CC: Blood Pressure Check   HPI Margaret Henry is a 72 y.o. 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   Interval History:   She presents today for a follow up of her blood pressure which was elevated at her last OV. She brings in her log for review. BP was 116/88 this morning. Highest BP was 146/101., lowest was 83/46. This morning she noticed a shooting pain on the medical aspect of her right knee.. Pain comes and goes and occurs at different sites and lasts for a short while but that of today is severe. She is on Tramadol prescribed by her Orthopedic.  Her right ring finger is not any better and continues to itch with associated redness around the nail cuticle. 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)   Anal intraepithelial neoplasia II (AIN II) 2017   Anemia 04/06/22   Aortic atherosclerosis (HCC)    B12 deficiency    Blood transfusion without reported diagnosis 6/23   Bronchitis    Cancer (HCC)    skin cancer on chest   Cataract 5/23   Chest pain 06/12/2016   Cholelithiasis    Colon polyps    Complication of anesthesia 05/16/2021   pt states was given too much during nasal surgery 1989; difficulty getting awake   COPD (chronic obstructive pulmonary disease) (HCC)    Coronary artery calcification    Diverticulitis 05/16/2021   occasional   Diverticulosis    Dyspnea 06/27/2016   Emphysema of lung (HCC)    External hemorrhoids    GERD (gastroesophageal reflux disease)    occasional   Hemorrhoids    High cholesterol    Hyperlipidemia LDL goal <70    Iron deficiency    Mild  aortic insufficiency    Neuropathy 04/07/2022   Osteoporosis    Oxygen deficiency    Pneumonia    Shortness of breath dyspnea    with exertion    Thyroid goiter    bx benign   Tobacco abuse    Vertigo 04/21/2017    Past Surgical History:  Procedure Laterality Date   ABDOMINAL HYSTERECTOMY     APPENDECTOMY  1973   BILATERAL OOPHORECTOMY  10/09/1999   for benign ovarian mass    BIOPSY THYROID     DENTAL SURGERY     dentures   NASAL SEPTUM SURGERY     PARTIAL HYSTERECTOMY  10/08/1974   for heavy menses    RECTAL EXAM UNDER ANESTHESIA N/A 12/14/2015   Procedure: RECTAL EXAM UNDER ANESTHESIA REMOVAL OF ANAL CANAL MASS; INTERNAL HEMORRHOID LIGATION, EXTERNAL HEMORRHOID LIGATION;  Surgeon: Karie Soda, MD;  Location: [facility];  Service: General;  Laterality: N/A;   TUBAL LIGATION     WISDOM TOOTH EXTRACTION      Family History  Problem Relation Age of Onset   Heart disease Mother    Alcohol abuse Mother    Asthma Mother    COPD Father    Heart disease Father    Lung cancer Maternal Grandfather    Cancer Maternal Grandfather    Liver cancer Paternal  Grandmother    Cancer Paternal Grandmother    Diabetes Paternal Aunt    Colon cancer Neg Hx    Colon polyps Neg Hx    Esophageal cancer Neg Hx    Rectal cancer Neg Hx    Stomach cancer Neg Hx     Social History   Socioeconomic History   Marital status: Widowed    Spouse name: Not on file   Number of children: Not on file   Years of education: Not on file   Highest education level: 9th grade  Occupational History   Not on file  Tobacco Use   Smoking status: Former    Packs/day: 1.50    Years: 50.00    Additional pack years: 0.00    Total pack years: 75.00    Types: Cigarettes    Quit date: 11/03/2018    Years since quitting: 4.2   Smokeless tobacco: Never  Vaping Use   Vaping Use: Never used  Substance and Sexual Activity   Alcohol use: No   Drug use: No   Sexual activity: Never  Other Topics Concern   Not  on file  Social History Narrative   Not on file   Social Determinants of Health   Financial Resource Strain: Low Risk  (01/01/2023)   Overall Financial Resource Strain (CARDIA)    Difficulty of Paying Living Expenses: Not hard at all  Food Insecurity: No Food Insecurity (01/22/2023)   Hunger Vital Sign    Worried About Running Out of Food in the Last Year: Never true    Ran Out of Food in the Last Year: Never true  Transportation Needs: No Transportation Needs (01/22/2023)   PRAPARE - Administrator, Civil Service (Medical): No    Lack of Transportation (Non-Medical): No  Physical Activity: Insufficiently Active (01/01/2023)   Exercise Vital Sign    Days of Exercise per Week: 3 days    Minutes of Exercise per Session: 30 min  Stress: No Stress Concern Present (01/01/2023)   [employer] of Occupational Health - Occupational Stress Questionnaire    Feeling of Stress : Not at all  Social Connections: Socially Isolated (01/01/2023)   Social Connection and Isolation Panel [NHANES]    Frequency of Communication with Friends and Family: More than three times a week    Frequency of Social Gatherings with Friends and Family: Twice a week    Attends Religious Services: Never    Database administrator or Organizations: No    Attends Banker Meetings: Never    Marital Status: Widowed    Allergies  Allergen Reactions   Hydrocodone Nausea And Vomiting   Propoxyphene N-Acetaminophen Nausea And Vomiting    Outpatient Medications Prior to Visit  Medication Sig Dispense Refill   alendronate (FOSAMAX) 70 MG tablet Take 1 tablet (70 mg total) by mouth every 7 (seven) days. Take with a full glass of water on an empty stomach. 12 tablet 1   atorvastatin (LIPITOR) 40 MG tablet TAKE 1 TABLET EVERY DAY 90 tablet 3   azithromycin (ZITHROMAX) 250 MG tablet Take 1 tablet (250 mg total) by mouth daily. 30 tablet 5   benzonatate (TESSALON) 200 MG capsule Take 1 capsule (200 mg  total) by mouth 3 (three) times daily as needed for cough. 30 capsule 1   bismuth subsalicylate (PEPTO BISMOL) 262 MG/15ML suspension Take 30 mLs by mouth every 6 (six) hours as needed for indigestion.     BREO ELLIPTA 200-25 MCG/ACT AEPB  INHALE 1 PUFF EVERY DAY 180 each 3   cephALEXin (KEFLEX) 500 MG capsule Take 1 capsule (500 mg total) by mouth 2 (two) times daily. 14 capsule 0   ferrous sulfate 325 (65 FE) MG EC tablet Take 325 mg by mouth every other day.     fluticasone (FLONASE) 50 MCG/ACT nasal spray Place 2 sprays into both nostrils daily. 16 g 6   ipratropium-albuterol (DUONEB) 0.5-2.5 (3) MG/3ML SOLN INHALE THE CONTENTS OF 1 VIAL VIA NEBULIZER EVERY 6 HOURS AS NEEDED 180 mL 0   loratadine (CLARITIN) 10 MG tablet Take 1 tablet (10 mg total) by mouth daily. 100 tablet 1   mupirocin ointment (BACTROBAN) 2 % Apply 1 Application topically 2 (two) times daily. To affected nostril (Patient taking differently: Place 1 Application into the nose See admin instructions. Apply to affected (inner) nostril(s) one to two times a day) 22 g 0   nitroGLYCERIN (NITROSTAT) 0.4 MG SL tablet Place 1 tablet (0.4 mg total) under the tongue every 5 (five) minutes as needed for chest pain. 25 tablet 6   OXYGEN Inhale 3 L/min into the lungs continuous.     pantoprazole (PROTONIX) 20 MG tablet Take 1 tablet (20 mg total) by mouth daily. 90 tablet 2   terbinafine (LAMISIL) 250 MG tablet Take 1 tablet (250 mg total) by mouth daily. 90 tablet 0   Tiotropium Bromide Monohydrate (SPIRIVA RESPIMAT) 2.5 MCG/ACT AERS INHALE 2 PUFFS EVERY DAY (Patient taking differently: Take 2 each by mouth daily.) 12 g 3   vitamin B-12 (CYANOCOBALAMIN) 100 MCG tablet Take 100 mcg by mouth daily.     Vitamin D, Ergocalciferol, (DRISDOL) 1.25 MG (50000 UNIT) CAPS capsule Take 50,000 Units by mouth every 7 (seven) days.     No facility-administered medications prior to visit.     ROS Review of Systems  Constitutional:  Negative for  activity change and appetite change.  HENT:  Negative for sinus pressure and sore throat.   Respiratory:  Negative for chest tightness, shortness of breath and wheezing.   Cardiovascular:  Negative for chest pain and palpitations.  Gastrointestinal:  Negative for abdominal distention, abdominal pain and constipation.  Genitourinary: Negative.   Musculoskeletal:        See HPI  Psychiatric/Behavioral:  Negative for behavioral problems and dysphoric mood.     Objective:  BP 134/66   Pulse 99   SpO2 99%        02/06/2023   11:30 AM 02/06/2023   10:56 AM 01/03/2023   11:49 AM  BP/Weight  Systolic BP 134 148 154  Diastolic BP 66 73 74      Physical Exam Constitutional:      Appearance: She is well-developed.  Cardiovascular:     Rate and Rhythm: Normal rate.     Heart sounds: Normal heart sounds. No murmur heard. Pulmonary:     Effort: Pulmonary effort is normal.     Breath sounds: Normal breath sounds. No wheezing or rales.  Chest:     Chest wall: No tenderness.  Abdominal:     General: Bowel sounds are normal. There is no distension.     Palpations: Abdomen is soft. There is no mass.     Tenderness: There is no abdominal tenderness.  Musculoskeletal:     Right lower leg: No edema.     Left lower leg: No edema.     Comments: TTP of medial aspect of right knee  Skin:    Comments: Right ring finger with  discoloration around nail cuticle and surrounding erythema  Neurological:     Mental Status: She is alert and oriented to person, place, and time.  Psychiatric:        Mood and Affect: Mood normal.        Latest Ref Rng & Units 01/03/2023    1:52 PM 11/26/2022    3:10 PM 09/27/2022   11:51 AM  CMP  Glucose 70 - 99 mg/dL 84  94  88   BUN 8 - 27 mg/dL 14  12  11    Creatinine 0.57 - 1.00 mg/dL 1.61  0.96  0.45   Sodium 134 - 144 mmol/L 142  140  144   Potassium 3.5 - 5.2 mmol/L 5.0  4.1  4.0   Chloride 96 - 106 mmol/L 104  105  107   CO2 20 - 29 mmol/L 24  28  28     Calcium 8.7 - 10.3 mg/dL 9.8  9.9  9.6   Total Protein 6.0 - 8.3 g/dL  6.8  6.6   Total Bilirubin 0.2 - 1.2 mg/dL  0.4  0.3   Alkaline Phos 39 - 117 U/L  86  139   AST 0 - 37 U/L  12  12   ALT 0 - 35 U/L  6  9     Lipid Panel     Component Value Date/Time   CHOL 143 01/30/2022 1158   CHOL 147 02/15/2021 1139   TRIG 132.0 01/30/2022 1158   HDL 74.30 01/30/2022 1158   HDL 58 02/15/2021 1139   CHOLHDL 2 01/30/2022 1158   VLDL 26.4 01/30/2022 1158   LDLCALC 42 01/30/2022 1158   LDLCALC 70 02/15/2021 1139   LDLDIRECT 166 (H) 09/26/2015 1001    CBC    Component Value Date/Time   WBC 6.9 01/03/2023 1352   WBC 7.6 11/26/2022 1510   RBC 3.81 01/03/2023 1352   RBC 3.72 (L) 11/26/2022 1510   HGB 12.1 01/03/2023 1352   HCT 37.0 01/03/2023 1352   PLT 271 01/03/2023 1352   MCV 97 01/03/2023 1352   MCH 31.8 01/03/2023 1352   MCH 31.6 09/17/2022 0518   MCHC 32.7 01/03/2023 1352   MCHC 33.1 11/26/2022 1510   RDW 13.1 01/03/2023 1352   LYMPHSABS 1.3 01/03/2023 1352   MONOABS 0.8 11/26/2022 1510   EOSABS 0.3 01/03/2023 1352   BASOSABS 0.1 01/03/2023 1352    Lab Results  Component Value Date   HGBA1C 5.5 07/04/2022    Assessment & Plan:   1. Paronychia of finger of right hand Not improving Completed course of Keflex and she remains on Terbinafine - Ambulatory referral to Orthopedics  2. Acute pain of right knee Pain is shooting in nature with a possible neuropathic component Unfortunately she is unable to swallow Gabapentin and Cymbalta capsules and size of Lyrica is the same as the 100mg  of Gabapentin which she was previously on. Continue Tramadol per Ortho. - predniSONE (DELTASONE) 20 MG tablet; Take 1 tablet (20 mg total) by mouth daily with breakfast.  Dispense: 5 tablet; Refill: 0   Meds ordered this encounter  Medications   DISCONTD: predniSONE (DELTASONE) 20 MG tablet    Sig: Take 1 tablet (20 mg total) by mouth daily with breakfast.    Dispense:  5 tablet     Refill:  0   predniSONE (DELTASONE) 20 MG tablet    Sig: Take 1 tablet (20 mg total) by mouth daily with breakfast.    Dispense:  5  tablet    Refill:  0    Follow-up: Return in about 6 months (around 08/09/2023) for Chronic medical conditions.       Hoy Register, MD, FAAFP. [facility] [place], [place] [phone]   02/06/2023, 12:15 PM

## 2023-02-06 NOTE — Progress Notes (Signed)
Shooting pain in right knee

## 2023-02-12 ENCOUNTER — Ambulatory Visit (INDEPENDENT_AMBULATORY_CARE_PROVIDER_SITE_OTHER): Payer: Medicare HMO | Admitting: Orthopaedic Surgery

## 2023-02-12 ENCOUNTER — Other Ambulatory Visit (HOSPITAL_BASED_OUTPATIENT_CLINIC_OR_DEPARTMENT_OTHER): Payer: Self-pay

## 2023-02-12 DIAGNOSIS — L03011 Cellulitis of right finger: Secondary | ICD-10-CM

## 2023-02-12 MED ORDER — AMOXICILLIN-POT CLAVULANATE 875-125 MG PO TABS
1.0000 | ORAL_TABLET | Freq: Two times a day (BID) | ORAL | 0 refills | Status: DC
  Filled 2023-02-12: qty 20, 10d supply, fill #0

## 2023-02-12 NOTE — Progress Notes (Signed)
Office Visit Note   Patient: Margaret Henry           Date of Birth: Mar 30, 1951           MRN: [id number] Visit Date: 02/12/2023              Requested by: Hoy Register, MD [address] [place],  [place] [address] PCP: Hoy Register, MD   Assessment & Plan: Visit Diagnoses:  1. Paronychia of right ring finger     Plan: Impression is questionable paronychia of the right ring finger.  Will place her on 10 days of Augmentin.  Discussed that I would be surprised if she lost her fingernail.  Could be fungal infection as well.  Will see her back as needed or if symptoms persist.  Follow-Up Instructions: No follow-ups on file.   Orders:  No orders of the defined types were placed in this encounter.  Meds ordered this encounter  Medications   amoxicillin-clavulanate (AUGMENTIN) 875-125 MG tablet    Sig: Take 1 tablet by mouth 2 (two) times daily for 10 days.    Dispense:  20 tablet    Refill:  0      Procedures: No procedures performed   Clinical Data: No additional findings.   Subjective: Chief Complaint  Patient presents with   Other    Right ring finger eval for infection    HPI Margaret Henry is 72 year old female wheelchair ambulator chronic COPD takes erythromycin daily for this who comes in for evaluation of suspected paronychia of the right ring finger.  Denies any injuries.  Complains of itching and nail feeling loose.  She has been soaking it with peroxide and using ointments and fungal cream without any improvement. Review of Systems  Constitutional: Negative.   HENT: Negative.    Eyes: Negative.   Respiratory: Negative.    Cardiovascular: Negative.   Endocrine: Negative.   Musculoskeletal: Negative.   Neurological: Negative.   Hematological: Negative.   Psychiatric/Behavioral: Negative.    All other systems reviewed and are negative.    Objective: Vital Signs: There were no vitals taken for this visit.  Physical Exam Vitals and  nursing note reviewed.  Constitutional:      Appearance: She is well-developed.  HENT:     Head: Atraumatic.     Nose: Nose normal.  Eyes:     Extraocular Movements: Extraocular movements intact.  Cardiovascular:     Pulses: Normal pulses.  Pulmonary:     Effort: Pulmonary effort is normal.  Abdominal:     Palpations: Abdomen is soft.  Musculoskeletal:     Cervical back: Neck supple.  Skin:    General: Skin is warm.     Capillary Refill: Capillary refill takes less than 2 seconds.  Neurological:     Mental Status: She is alert. Mental status is at baseline.  Psychiatric:        Behavior: Behavior normal.        Thought Content: Thought content normal.        Judgment: Judgment normal.     Ortho Exam Examination of right ring finger shows local erythema of the cuticle.  There is no drainage.  Looks like the nail is coming detached from the paronychium.  The finger pulp is soft. Specialty Comments:  No specialty comments available.  Imaging: No results found.   PMFS History: Patient Active Problem List   Diagnosis Date Noted   Incomplete bladder emptying 08/17/2022   COPD exacerbation (HCC)  08/17/2022   CAP (community acquired pneumonia) 06/12/2022   Shortness of breath 05/31/2022   Lung nodule 05/23/2022   Iron deficiency anemia 04/07/2022   Neuropathy 04/07/2022   B12 deficiency anemia 04/06/2022   Protein calorie malnutrition (HCC) 01/10/2022   Syncope and collapse 06/13/2021   COVID-19 virus infection 06/13/2021   Chronic respiratory failure with hypoxia (HCC) 06/13/2021   Mild aortic insufficiency 05/16/2021   Hemorrhoids 05/16/2021   Colon polyps 05/16/2021   Cancer (HCC) 05/16/2021   Bronchitis 05/16/2021   Allergy 05/16/2021   COPD with acute exacerbation (HCC) 10/03/2018   Overactive bladder 03/17/2018   Chronic respiratory failure (HCC) 10/04/2017   Seborrheic keratoses 06/12/2016   Coronary arteriosclerosis 09/26/2015   Unspecified vitamin D  deficiency 03/02/2010   Allergic rhinitis 03/02/2010   BACK PAIN, LUMBAR 03/02/2010   TROCHANTERIC BURSITIS, BILATERAL 03/02/2010   THYROID NODULE, RIGHT 11/09/2009   Hyperlipidemia 02/25/2009   GERD (gastroesophageal reflux disease) 02/25/2009   Osteoporosis 01/30/2009   COMPRESSION FRACTURE, SPINE 01/21/2009   Goiter, unspecified 10/08/1997   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)   Anal intraepithelial neoplasia II (AIN II) 2017   Anemia 04/06/22   Aortic atherosclerosis (HCC)    B12 deficiency    Blood transfusion without reported diagnosis 6/23   Bronchitis    Cancer (HCC)    skin cancer on chest   Cataract 5/23   Chest pain 06/12/2016   Cholelithiasis    Colon polyps    Complication of anesthesia 05/16/2021   pt states was given too much during nasal surgery 1989; difficulty getting awake   COPD (chronic obstructive pulmonary disease) (HCC)    Coronary artery calcification    Diverticulitis 05/16/2021   occasional   Diverticulosis    Dyspnea 06/27/2016   Emphysema of lung (HCC)    External hemorrhoids    GERD (gastroesophageal reflux disease)    occasional   Hemorrhoids    High cholesterol    Hyperlipidemia LDL goal <70    Iron deficiency    Mild aortic insufficiency    Neuropathy 04/07/2022   Osteoporosis    Oxygen deficiency    Pneumonia    Shortness of breath dyspnea    with exertion    Thyroid goiter    bx benign   Tobacco abuse    Vertigo 04/21/2017    Family History  Problem Relation Age of Onset   Heart disease Mother    Alcohol abuse Mother    Asthma Mother    COPD Father    Heart disease Father    Lung cancer Maternal Grandfather    Cancer Maternal Grandfather    Liver cancer Paternal Grandmother    Cancer Paternal Grandmother    Diabetes Paternal Aunt    Colon cancer Neg Hx    Colon polyps Neg Hx    Esophageal cancer Neg Hx     Rectal cancer Neg Hx    Stomach cancer Neg Hx     Past Surgical History:  Procedure Laterality Date   ABDOMINAL HYSTERECTOMY     APPENDECTOMY  1973   BILATERAL OOPHORECTOMY  10/09/1999   for benign ovarian mass    BIOPSY THYROID     DENTAL SURGERY     dentures   NASAL SEPTUM SURGERY     PARTIAL HYSTERECTOMY  10/08/1974   for heavy menses    RECTAL EXAM UNDER ANESTHESIA  N/A 12/14/2015   Procedure: RECTAL EXAM UNDER ANESTHESIA REMOVAL OF ANAL CANAL MASS; INTERNAL HEMORRHOID LIGATION, EXTERNAL HEMORRHOID LIGATION;  Surgeon: Karie Soda, MD;  Location: [facility];  Service: General;  Laterality: N/A;   TUBAL LIGATION     WISDOM TOOTH EXTRACTION     Social History   Occupational History   Not on file  Tobacco Use   Smoking status: Former    Packs/day: 1.50    Years: 50.00    Additional pack years: 0.00    Total pack years: 75.00    Types: Cigarettes    Quit date: 11/03/2018    Years since quitting: 4.2   Smokeless tobacco: Never  Vaping Use   Vaping Use: Never used  Substance and Sexual Activity   Alcohol use: No   Drug use: No   Sexual activity: Never

## 2023-02-19 ENCOUNTER — Ambulatory Visit: Payer: Self-pay

## 2023-02-19 ENCOUNTER — Other Ambulatory Visit (HOSPITAL_BASED_OUTPATIENT_CLINIC_OR_DEPARTMENT_OTHER): Payer: Self-pay

## 2023-02-19 ENCOUNTER — Telehealth: Payer: Medicare HMO | Admitting: Nurse Practitioner

## 2023-02-19 DIAGNOSIS — B372 Candidiasis of skin and nail: Secondary | ICD-10-CM

## 2023-02-19 MED ORDER — NYSTATIN 100000 UNIT/GM EX POWD
1.0000 | Freq: Three times a day (TID) | CUTANEOUS | 0 refills | Status: AC
  Filled 2023-02-19: qty 60, 20d supply, fill #0

## 2023-02-19 MED ORDER — FLUCONAZOLE 150 MG PO TABS
150.0000 mg | ORAL_TABLET | Freq: Once | ORAL | 0 refills | Status: AC
  Filled 2023-02-19: qty 1, 1d supply, fill #0

## 2023-02-19 NOTE — Progress Notes (Signed)
Virtual Visit Consent   Margaret Henry, you are scheduled for a virtual visit with a [facility] provider today. Just as with appointments in the office, your consent must be obtained to participate. Your consent will be active for this visit and any virtual visit you may have with one of our providers in the next 365 days. If you have a MyChart account, a copy of this consent can be sent to you electronically.  As this is a virtual visit, video technology does not allow for your provider to perform a traditional examination. This may limit your provider's ability to fully assess your condition. If your provider identifies any concerns that need to be evaluated in person or the need to arrange testing (such as labs, EKG, etc.), we will make arrangements to do so. Although advances in technology are sophisticated, we cannot ensure that it will always work on either your end or our end. If the connection with a video visit is poor, the visit may have to be switched to a telephone visit. With either a video or telephone visit, we are not always able to ensure that we have a secure connection.  By engaging in this virtual visit, you consent to the provision of healthcare and authorize for your insurance to be billed (if applicable) for the services provided during this visit. Depending on your insurance coverage, you may receive a charge related to this service.  I need to obtain your verbal consent now. Are you willing to proceed with your visit today? Margaret Henry has provided verbal consent on 02/19/2023 for a virtual visit (video or telephone). Viviano Simas, FNP  Date: 02/19/2023 3:08 PM  Virtual Visit via Video Note   I, Viviano Simas, connected with  Margaret Henry  ([id number], [birth year]) on 02/19/23 at  3:15 PM EDT by a video-enabled telemedicine application and verified that I am speaking with the correct person using two identifiers.  Location: Patient: Virtual Visit Location Patient:  Home Provider: Virtual Visit Location Provider: Home Office   I discussed the limitations of evaluation and management by telemedicine and the availability of in person appointments. The patient expressed understanding and agreed to proceed.    History of Present Illness: Margaret Henry is a 72 y.o. who identifies as a female who was assigned female at birth, and is being seen today for a rash under her breast.  Symptom onset was 3-5 days ago  She has a home health nurse that comes to give her a bath each week and the nurse told her to put Vaseline on it  This caused the rash to spread She then use neosporin and benadryl cream without relief   She as on Augmentin for the past week for paronychia   The rash does not hurt, it was itchy and burns at times  She has had shingles in the past   The  Problems:  Patient Active Problem List   Diagnosis Date Noted   Incomplete bladder emptying 08/17/2022   COPD exacerbation (HCC) 08/17/2022   CAP (community acquired pneumonia) 06/12/2022   Shortness of breath 05/31/2022   Lung nodule 05/23/2022   Iron deficiency anemia 04/07/2022   Neuropathy 04/07/2022   B12 deficiency anemia 04/06/2022   Protein calorie malnutrition (HCC) 01/10/2022   Syncope and collapse 06/13/2021   COVID-19 virus infection 06/13/2021   Chronic respiratory failure with hypoxia (HCC) 06/13/2021   Mild aortic insufficiency 05/16/2021   Hemorrhoids 05/16/2021   Colon polyps 05/16/2021  Cancer (HCC) 05/16/2021   Bronchitis 05/16/2021   Allergy 05/16/2021   COPD with acute exacerbation (HCC) 10/03/2018   Overactive bladder 03/17/2018   Chronic respiratory failure (HCC) 10/04/2017   Seborrheic keratoses 06/12/2016   Coronary arteriosclerosis 09/26/2015   Unspecified vitamin D deficiency 03/02/2010   Allergic rhinitis 03/02/2010   BACK PAIN, LUMBAR 03/02/2010   TROCHANTERIC BURSITIS, BILATERAL 03/02/2010   THYROID NODULE, RIGHT 11/09/2009   Hyperlipidemia  02/25/2009   GERD (gastroesophageal reflux disease) 02/25/2009   Osteoporosis 01/30/2009   COMPRESSION FRACTURE, SPINE 01/21/2009   Goiter, unspecified 10/08/1997    Allergies:  Allergies  Allergen Reactions   Hydrocodone Nausea And Vomiting   Propoxyphene N-Acetaminophen Nausea And Vomiting   Medications:  Current Outpatient Medications:    alendronate (FOSAMAX) 70 MG tablet, Take 1 tablet (70 mg total) by mouth every 7 (seven) days. Take with a full glass of water on an empty stomach., Disp: 12 tablet, Rfl: 1   amoxicillin-clavulanate (AUGMENTIN) 875-125 MG tablet, Take 1 tablet by mouth 2 (two) times daily for 10 days., Disp: 20 tablet, Rfl: 0   atorvastatin (LIPITOR) 40 MG tablet, TAKE 1 TABLET EVERY DAY, Disp: 90 tablet, Rfl: 3   azithromycin (ZITHROMAX) 250 MG tablet, Take 1 tablet (250 mg total) by mouth daily., Disp: 30 tablet, Rfl: 5   benzonatate (TESSALON) 200 MG capsule, Take 1 capsule (200 mg total) by mouth 3 (three) times daily as needed for cough., Disp: 30 capsule, Rfl: 1   bismuth subsalicylate (PEPTO BISMOL) 262 MG/15ML suspension, Take 30 mLs by mouth every 6 (six) hours as needed for indigestion., Disp: , Rfl:    BREO ELLIPTA 200-25 MCG/ACT AEPB, INHALE 1 PUFF EVERY DAY, Disp: 180 each, Rfl: 3   cephALEXin (KEFLEX) 500 MG capsule, Take 1 capsule (500 mg total) by mouth 2 (two) times daily., Disp: 14 capsule, Rfl: 0   ferrous sulfate 325 (65 FE) MG EC tablet, Take 325 mg by mouth every other day., Disp: , Rfl:    fluticasone (FLONASE) 50 MCG/ACT nasal spray, Place 2 sprays into both nostrils daily., Disp: 16 g, Rfl: 6   ipratropium-albuterol (DUONEB) 0.5-2.5 (3) MG/3ML SOLN, INHALE THE CONTENTS OF 1 VIAL VIA NEBULIZER EVERY 6 HOURS AS NEEDED, Disp: 180 mL, Rfl: 0   loratadine (CLARITIN) 10 MG tablet, Take 1 tablet (10 mg total) by mouth daily., Disp: 100 tablet, Rfl: 1   mupirocin ointment (BACTROBAN) 2 %, Apply 1 Application topically 2 (two) times daily. To affected  nostril (Patient taking differently: Place 1 Application into the nose See admin instructions. Apply to affected (inner) nostril(s) one to two times a day), Disp: 22 g, Rfl: 0   nitroGLYCERIN (NITROSTAT) 0.4 MG SL tablet, Place 1 tablet (0.4 mg total) under the tongue every 5 (five) minutes as needed for chest pain., Disp: 25 tablet, Rfl: 6   OXYGEN, Inhale 3 L/min into the lungs continuous., Disp: , Rfl:    pantoprazole (PROTONIX) 20 MG tablet, Take 1 tablet (20 mg total) by mouth daily., Disp: 90 tablet, Rfl: 2   predniSONE (DELTASONE) 20 MG tablet, Take 1 tablet (20 mg total) by mouth daily with breakfast., Disp: 5 tablet, Rfl: 0   terbinafine (LAMISIL) 250 MG tablet, Take 1 tablet (250 mg total) by mouth daily., Disp: 90 tablet, Rfl: 0   Tiotropium Bromide Monohydrate (SPIRIVA RESPIMAT) 2.5 MCG/ACT AERS, INHALE 2 PUFFS EVERY DAY (Patient taking differently: Take 2 each by mouth daily.), Disp: 12 g, Rfl: 3   vitamin B-12 (CYANOCOBALAMIN)  100 MCG tablet, Take 100 mcg by mouth daily., Disp: , Rfl:    Vitamin D, Ergocalciferol, (DRISDOL) 1.25 MG (50000 UNIT) CAPS capsule, Take 50,000 Units by mouth every 7 (seven) days., Disp: , Rfl:   Observations/Objective: Patient is well-developed, well-nourished in no acute distress.  Resting comfortably  at home.  Head is normocephalic, atraumatic.  No labored breathing.  Speech is clear and coherent with logical content.  Patient is alert and oriented at baseline.  Unable to assess this region over video per policy   Assessment and Plan: 1. Yeast infection of the skin  Likely secondary to antibiotic use  Advised stopping Vaseline and antibiotic topical      Meds ordered this encounter  Medications   nystatin (MYCOSTATIN/NYSTOP) powder    Sig: Apply 1 Application topically 3 (three) times daily for 14 days.    Dispense:  60 g    Refill:  0   fluconazole (DIFLUCAN) 150 MG tablet    Sig: Take 1 tablet (150 mg total) by mouth once for 1 dose.     Dispense:  1 tablet    Refill:  0     Follow Up Instructions: I discussed the assessment and treatment plan with the patient. The patient was provided an opportunity to ask questions and all were answered. The patient agreed with the plan and demonstrated an understanding of the instructions.  A copy of instructions were sent to the patient via MyChart unless otherwise noted below.    The patient was advised to call back or seek an in-person evaluation if the symptoms worsen or if the condition fails to improve as anticipated.  Time:  I spent 15 minutes with the patient via telehealth technology discussing the above problems/concerns.    Viviano Simas, FNP

## 2023-02-19 NOTE — Telephone Encounter (Signed)
Noted appointment for today with [facility].

## 2023-02-19 NOTE — Telephone Encounter (Signed)
  Chief Complaint: rash Symptoms: rash under L breast and stomach, redness and itching Frequency: 1 week  Pertinent Negatives: NA Disposition: [] ED /[] Urgent Care (no appt availability in office) / [] Appointment(In office/virtual)/ [x]  [facility]/ [] Home Care/ [] Refused Recommended Disposition /[] [facility]/ []  Follow-up with PCP Additional Notes: pt states she has been applied OTC creams to under L breast but nothing helping and starting to spread on stomach. Pt states she started on Augmentin roughly 5 days ago and unsure if r/t the abx or not. Started out as red line on crease of breast but spread and got worse. No appts available until 03/07/23, so recommended virtual UC, pt agreed and scheduled for today at 1515.   Summary: skin irritation   The patient shares that they've experienced skin irritation under their breast that is spreading to their stomach  The patient has experienced the skin irritation for roughly a week  The patient would like to speak with a member of staff further when possible  Please contact further when possible     Reason for Disposition  Localized rash present > 7 days  Answer Assessment - Initial Assessment Questions 1. APPEARANCE of RASH: "Describe the rash."      Red rash and red dots  2. LOCATION: "Where is the rash located?"      Under L breast and stomach  5. ONSET: "When did the rash start?"      1 week  6. ITCHING: "Does the rash itch?" If Yes, ask: "How bad is the itch?"  (Scale 0-10; or none, mild, moderate, severe)     Moderate  7. PAIN: "Does the rash hurt?" If Yes, ask: "How bad is the pain?"  (Scale 0-10; or none, mild, moderate, severe)    - NONE (0): no pain    - MILD (1-3): doesn't interfere with normal activities     - MODERATE (4-7): interferes with normal activities or awakens from sleep     - SEVERE (8-10): excruciating pain, unable to do any normal activities     no 8. OTHER SYMPTOMS: "Do you have any other  symptoms?" (e.g., fever)  Protocols used: Rash or Redness - Localized-A-AH

## 2023-02-20 ENCOUNTER — Other Ambulatory Visit (HOSPITAL_BASED_OUTPATIENT_CLINIC_OR_DEPARTMENT_OTHER): Payer: Self-pay

## 2023-02-22 ENCOUNTER — Encounter (HOSPITAL_BASED_OUTPATIENT_CLINIC_OR_DEPARTMENT_OTHER): Payer: Self-pay

## 2023-02-22 ENCOUNTER — Emergency Department (HOSPITAL_BASED_OUTPATIENT_CLINIC_OR_DEPARTMENT_OTHER)
Admission: EM | Admit: 2023-02-22 | Discharge: 2023-02-22 | Disposition: A | Discharge status: Home / Self Care | Payer: Medicare HMO | Attending: Emergency Medicine | Admitting: Emergency Medicine

## 2023-02-22 ENCOUNTER — Ambulatory Visit: Payer: Medicare HMO | Admitting: Orthopaedic Surgery

## 2023-02-22 ENCOUNTER — Other Ambulatory Visit (HOSPITAL_BASED_OUTPATIENT_CLINIC_OR_DEPARTMENT_OTHER): Payer: Self-pay

## 2023-02-22 ENCOUNTER — Other Ambulatory Visit: Payer: Self-pay | Admitting: Family Medicine

## 2023-02-22 ENCOUNTER — Other Ambulatory Visit: Payer: Self-pay

## 2023-02-22 DIAGNOSIS — J449 Chronic obstructive pulmonary disease, unspecified: Secondary | ICD-10-CM | POA: Insufficient documentation

## 2023-02-22 DIAGNOSIS — L089 Local infection of the skin and subcutaneous tissue, unspecified: Secondary | ICD-10-CM | POA: Insufficient documentation

## 2023-02-22 DIAGNOSIS — L03011 Cellulitis of right finger: Secondary | ICD-10-CM | POA: Diagnosis not present

## 2023-02-22 DIAGNOSIS — Z7951 Long term (current) use of inhaled steroids: Secondary | ICD-10-CM | POA: Diagnosis not present

## 2023-02-22 MED ORDER — DOXYCYCLINE HYCLATE 100 MG PO CAPS
100.0000 mg | ORAL_CAPSULE | Freq: Two times a day (BID) | ORAL | 0 refills | Status: DC
  Filled 2023-02-22: qty 14, 7d supply, fill #0

## 2023-02-22 NOTE — Discharge Instructions (Signed)
Please read and follow all provided instructions.  Your diagnoses today include:  1. Finger infection    Tests performed today include: Vital signs. See below for your results today.   Medications prescribed:  Doxycycline - antibiotic  You have been prescribed an antibiotic medicine: take the entire course of medicine even if you are feeling better. Stopping early can cause the antibiotic not to work.  Take any prescribed medications only as directed.  Home care instructions:  Follow any educational materials contained in this packet.  BE VERY CAREFUL not to take multiple medicines containing Tylenol (also called acetaminophen). Doing so can lead to an overdose which can damage your liver and cause liver failure and possibly death.   Follow-up instructions: Please follow-up with Dr. Roda Shutters for further evaluation of your symptoms.   Return instructions:  Please return to the Emergency Department if you experience worsening symptoms.  Please return if you have any other emergent concerns.  Additional Information:  Your vital signs today were: BP (!) 155/55   Pulse 97   Temp 98 F (36.7 C) (Oral)   Resp 18   Ht 5\' 4"  (1.626 m)   Wt 48.5 kg   SpO2 93%   BMI 18.37 kg/m  If your blood pressure (BP) was elevated above 135/85 this visit, please have this repeated by your doctor within one month. --------------

## 2023-02-22 NOTE — ED Triage Notes (Signed)
Patient has redness around her right ring fingernail. She was on antibiotics for the same. She stated they caused her to get a rash so she has stopped those.

## 2023-02-22 NOTE — ED Provider Notes (Signed)
[facility] Provider Note   CSN: [id number] Arrival date & time: 02/22/23  1040     History  Chief Complaint  Patient presents with   Hand Pain    Margaret Henry is a 72 y.o. female.  Patient with history of COPD on chronic oxygen --presents to the emergency department for additional evaluation of right ring finger tip infection.  Patient has had symptoms since the end of March.  She is followed up with primary care as well as orthopedics.  She has been trialed on antibiotics, topical fungal therapy.  Most recently seen by Ortho 10 days ago.  She was placed on Augmentin.  She was able to tolerate this for about a week but developed a rash underneath her breasts.  She has most recently been on a dose of Diflucan and nystatin powder for yeast infection of the skin.  She presents today because she has had expansion of the redness away from the cuticle as well as an increasing throbbing pain over the past day or so.  No fevers.  She is able to flex and extend her finger.  Denies history of diabetes.       Home Medications Prior to Admission medications   Medication Sig Start Date End Date Taking? Authorizing Provider  doxycycline (VIBRAMYCIN) 100 MG capsule Take 1 capsule (100 mg total) by mouth 2 (two) times daily. 02/22/23  Yes Renne Crigler, PA-C  alendronate (FOSAMAX) 70 MG tablet Take 1 tablet (70 mg total) by mouth every 7 (seven) days. Take with a full glass of water on an empty stomach. 01/03/23   Hoy Register, MD  atorvastatin (LIPITOR) 40 MG tablet TAKE 1 TABLET EVERY DAY 12/18/22   Georgeanna Lea, MD  azithromycin (ZITHROMAX) 250 MG tablet Take 1 tablet (250 mg total) by mouth daily. 12/24/22   Mannam, Colbert Coyer, MD  benzonatate (TESSALON) 200 MG capsule Take 1 capsule (200 mg total) by mouth 3 (three) times daily as needed for cough. 05/31/22   Cobb, Ruby Cola, NP  bismuth subsalicylate (PEPTO BISMOL) 262 MG/15ML suspension Take  30 mLs by mouth every 6 (six) hours as needed for indigestion.    [provider]  BREO ELLIPTA 200-25 MCG/ACT AEPB INHALE 1 PUFF EVERY DAY 12/18/22   Mannam, Colbert Coyer, MD  ferrous sulfate 325 (65 FE) MG EC tablet Take 325 mg by mouth every other day.    [provider]  fluticasone (FLONASE) 50 MCG/ACT nasal spray Place 2 sprays into both nostrils daily. 01/03/23   Hoy Register, MD  ipratropium-albuterol (DUONEB) 0.5-2.5 (3) MG/3ML SOLN INHALE THE CONTENTS OF 1 VIAL VIA NEBULIZER EVERY 6 HOURS AS NEEDED 12/18/22   Hoy Register, MD  loratadine (CLARITIN) 10 MG tablet Take 1 tablet (10 mg total) by mouth daily. 01/03/23   Hoy Register, MD  mupirocin ointment (BACTROBAN) 2 % Apply 1 Application topically 2 (two) times daily. To affected nostril Patient taking differently: Place 1 Application into the nose See admin instructions. Apply to affected (inner) nostril(s) one to two times a day 05/31/22   Cobb, Ruby Cola, NP  nitroGLYCERIN (NITROSTAT) 0.4 MG SL tablet Place 1 tablet (0.4 mg total) under the tongue every 5 (five) minutes as needed for chest pain. 11/28/22   Georgeanna Lea, MD  nystatin (MYCOSTATIN/NYSTOP) powder Apply 1 Application topically 3 (three) times daily for 14 days. 02/19/23 03/12/23  Viviano Simas, FNP  OXYGEN Inhale 3 L/min into the lungs continuous.  [provider]  pantoprazole (PROTONIX) 20 MG tablet Take 1 tablet (20 mg total) by mouth daily. 09/27/22   Doree Albee, PA-C  predniSONE (DELTASONE) 20 MG tablet Take 1 tablet (20 mg total) by mouth daily with breakfast. 02/06/23   Hoy Register, MD  terbinafine (LAMISIL) 250 MG tablet Take 1 tablet (250 mg total) by mouth daily. 01/03/23   Hoy Register, MD  Tiotropium Bromide Monohydrate (SPIRIVA RESPIMAT) 2.5 MCG/ACT AERS INHALE 2 PUFFS EVERY DAY Patient taking differently: Take 2 each by mouth daily. 04/05/22   Mannam, Colbert Coyer, MD  vitamin B-12 (CYANOCOBALAMIN) 100 MCG tablet Take 100 mcg  by mouth daily.    [provider]  Vitamin D, Ergocalciferol, (DRISDOL) 1.25 MG (50000 UNIT) CAPS capsule Take 50,000 Units by mouth every 7 (seven) days.    [provider]      Allergies    Hydrocodone, Propoxyphene n-acetaminophen, and Augmentin [amoxicillin-pot clavulanate]    Review of Systems   Review of Systems  Physical Exam Updated Vital Signs BP (!) 155/55   Pulse 97   Temp 98 F (36.7 C) (Oral)   Resp 18   Ht 5\' 4"  (1.626 m)   Wt 48.5 kg   SpO2 93%   BMI 18.37 kg/m  Physical Exam Vitals and nursing note reviewed.  Constitutional:      Appearance: She is well-developed.  HENT:     Head: Normocephalic and atraumatic.  Eyes:     Conjunctiva/sclera: Conjunctivae normal.  Pulmonary:     Effort: No respiratory distress.  Musculoskeletal:     Cervical back: Normal range of motion and neck supple.  Skin:    General: Skin is warm and dry.     Comments: Right ring finger: There does appear to be a fungal infection of the nail and the surrounding cuticle is inflamed.  She has erythema extending towards but not involving the DIP joint.  Full range of motion of the joints of the finger.  No active drainage.  No apparent fluid collection.  Pulp of the finger is soft.  Neurological:     Mental Status: She is alert.     ED Results / Procedures / Treatments   Labs (all labs ordered are listed, but only abnormal results are displayed) Labs Reviewed - No data to display  EKG None  Radiology No results found.  Procedures Procedures    Medications Ordered in ED Medications - No data to display  ED Course/ Medical Decision Making/ A&P    Patient seen and examined. History obtained directly from patient.  I reviewed recent outpatient notes for this problem including orthopedic and PCP notes.  Labs/EKG: None ordered  Imaging: None ordered  Medications/Fluids: None ordered  Most recent vital signs reviewed and are as follows: BP (!) 155/55    Pulse 97   Temp 98 F (36.7 C) (Oral)   Resp 18   Ht 5\' 4"  (1.626 m)   Wt 48.5 kg   SpO2 93%   BMI 18.37 kg/m   Initial impression: Right ring fingertip infection  Home treatment plan: Doxycycline, follow-up with orthopedic surgery when able  Return instructions discussed with patient: Worsening redness, pain, fever  Follow-up instructions discussed with patient: Orthopedic surgery for reevaluation                            Medical Decision Making Risk Prescription drug management.   Patient with a festering infection of her right  ring fingertip.  This is most consistent with a fungal infection, however with recent increase in pain and mild extension of the erythema, concern for secondary infection.  For this reason patient be placed on doxycycline.  She recently had a difficult time with the Augmentin.  She has been seen by Ortho.  At this point I do not think that she needs her nail removed or an incision and drainage as there does not appear to be a fluctuant collection.  However, her current infection has been poorly responsive to antifungals and antibiotics to this point.  No indications for emergent orthopedic consultation.        Final Clinical Impression(s) / ED Diagnoses Final diagnoses:  Finger infection    Rx / DC Orders ED Discharge Orders          Ordered    doxycycline (VIBRAMYCIN) 100 MG capsule  2 times daily        02/22/23 1146              Renne Crigler, PA-C 02/22/23 1156    Pricilla Loveless, MD 02/22/23 1452

## 2023-03-04 NOTE — Progress Notes (Unsigned)
Subjective:   Margaret Henry is a 72 y.o. female who presents for Medicare Annual (Subsequent) preventive examination.  Review of Systems    ***       Objective:    There were no vitals filed for this visit. There is no height or weight on file to calculate BMI.     02/22/2023   10:55 AM 09/15/2022    1:20 PM 08/17/2022    7:04 PM 08/17/2022    8:43 AM 07/04/2022    3:30 PM 04/06/2022   11:00 PM 08/13/2021    3:54 PM  Advanced Directives  Does Patient Have a Medical Advance Directive? No;Yes No No Yes No No No  Type of Advance Directive Healthcare Power of [employer] Power of Attorney     Does patient want to make changes to medical advance directive? No - Patient declined        Copy of Healthcare Power of Attorney in Chart? No - copy requested        Would patient like information on creating a medical advance directive? No - Patient declined No - Patient declined No - Patient declined   No - Patient declined     Current Medications (verified) Outpatient Encounter Medications as of 03/05/2023  Medication Sig   alendronate (FOSAMAX) 70 MG tablet Take 1 tablet (70 mg total) by mouth every 7 (seven) days. Take with a full glass of water on an empty stomach.   atorvastatin (LIPITOR) 40 MG tablet TAKE 1 TABLET EVERY DAY   azithromycin (ZITHROMAX) 250 MG tablet Take 1 tablet (250 mg total) by mouth daily.   benzonatate (TESSALON) 200 MG capsule Take 1 capsule (200 mg total) by mouth 3 (three) times daily as needed for cough.   bismuth subsalicylate (PEPTO BISMOL) 262 MG/15ML suspension Take 30 mLs by mouth every 6 (six) hours as needed for indigestion.   BREO ELLIPTA 200-25 MCG/ACT AEPB INHALE 1 PUFF EVERY DAY   doxycycline (VIBRAMYCIN) 100 MG capsule Take 1 capsule (100 mg total) by mouth 2 (two) times daily.   ferrous sulfate 325 (65 FE) MG EC tablet Take 325 mg by mouth every other day.   fluticasone (FLONASE) 50 MCG/ACT nasal spray Place 2 sprays into both nostrils  daily.   ipratropium-albuterol (DUONEB) 0.5-2.5 (3) MG/3ML SOLN INHALE THE CONTENTS OF 1 VIAL VIA NEBULIZER EVERY 6 HOURS AS NEEDED   loratadine (CLARITIN) 10 MG tablet Take 1 tablet (10 mg total) by mouth daily.   mupirocin ointment (BACTROBAN) 2 % Apply 1 Application topically 2 (two) times daily. To affected nostril (Patient taking differently: Place 1 Application into the nose See admin instructions. Apply to affected (inner) nostril(s) one to two times a day)   nitroGLYCERIN (NITROSTAT) 0.4 MG SL tablet Place 1 tablet (0.4 mg total) under the tongue every 5 (five) minutes as needed for chest pain.   nystatin (MYCOSTATIN/NYSTOP) powder Apply 1 Application topically 3 (three) times daily for 14 days.   OXYGEN Inhale 3 L/min into the lungs continuous.   pantoprazole (PROTONIX) 20 MG tablet Take 1 tablet (20 mg total) by mouth daily.   predniSONE (DELTASONE) 20 MG tablet Take 1 tablet (20 mg total) by mouth daily with breakfast.   terbinafine (LAMISIL) 250 MG tablet Take 1 tablet (250 mg total) by mouth daily.   Tiotropium Bromide Monohydrate (SPIRIVA RESPIMAT) 2.5 MCG/ACT AERS INHALE 2 PUFFS EVERY DAY (Patient taking differently: Take 2 each by mouth daily.)   vitamin B-12 (CYANOCOBALAMIN) 100  MCG tablet Take 100 mcg by mouth daily.   Vitamin D, Ergocalciferol, (DRISDOL) 1.25 MG (50000 UNIT) CAPS capsule Take 50,000 Units by mouth every 7 (seven) days.   No facility-administered encounter medications on file as of 03/05/2023.    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)   Anal intraepithelial neoplasia II (AIN II) 2017   Anemia 04/06/22   Aortic atherosclerosis (HCC)    B12 deficiency    Blood transfusion without reported diagnosis 6/23   Bronchitis    Cancer (HCC)     skin cancer on chest   Cataract 5/23   Chest pain 06/12/2016   Cholelithiasis    Colon polyps    Complication of anesthesia 05/16/2021   pt states was given too much during nasal surgery 1989; difficulty getting awake   COPD (chronic obstructive pulmonary disease) (HCC)    Coronary artery calcification    Diverticulitis 05/16/2021   occasional   Diverticulosis    Dyspnea 06/27/2016   Emphysema of lung (HCC)    External hemorrhoids    GERD (gastroesophageal reflux disease)    occasional   Hemorrhoids    High cholesterol    Hyperlipidemia LDL goal <70    Iron deficiency    Mild aortic insufficiency    Neuropathy 04/07/2022   Osteoporosis    Oxygen deficiency    Pneumonia    Shortness of breath dyspnea    with exertion    Thyroid goiter    bx benign   Tobacco abuse    Vertigo 04/21/2017   Past Surgical History:  Procedure Laterality Date   ABDOMINAL HYSTERECTOMY     APPENDECTOMY  1973   BILATERAL OOPHORECTOMY  10/09/1999   for benign ovarian mass    BIOPSY THYROID     DENTAL SURGERY     dentures   NASAL SEPTUM SURGERY     PARTIAL HYSTERECTOMY  10/08/1974   for heavy menses    RECTAL EXAM UNDER ANESTHESIA N/A 12/14/2015   Procedure: RECTAL EXAM UNDER ANESTHESIA REMOVAL OF ANAL CANAL MASS; INTERNAL HEMORRHOID LIGATION, EXTERNAL HEMORRHOID LIGATION;  Surgeon: Karie Soda, MD;  Location: [facility];  Service: General;  Laterality: N/A;   TUBAL LIGATION     WISDOM TOOTH EXTRACTION     Family History  Problem Relation Age of Onset   Heart disease Mother    Alcohol abuse Mother    Asthma Mother    COPD Father    Heart disease Father    Lung cancer Maternal Grandfather    Cancer Maternal Grandfather    Liver cancer Paternal Grandmother    Cancer Paternal Grandmother    Diabetes Paternal Aunt    Colon cancer Neg Hx    Colon polyps Neg Hx    Esophageal cancer Neg Hx    Rectal cancer Neg Hx    Stomach cancer Neg Hx    Social History   Socioeconomic History    Marital status: Widowed    Spouse name: Not on file   Number of children: Not on file   Years of education: Not on file   Highest education level: 9th grade  Occupational History   Not on file  Tobacco Use   Smoking status: Former    Packs/day: 1.50    Years: 50.00    Additional pack years: 0.00  Total pack years: 75.00    Types: Cigarettes    Quit date: 11/03/2018    Years since quitting: 4.3   Smokeless tobacco: Never  Vaping Use   Vaping Use: Never used  Substance and Sexual Activity   Alcohol use: No   Drug use: No   Sexual activity: Never  Other Topics Concern   Not on file  Social History Narrative   Not on file   Social Determinants of Health   Financial Resource Strain: Low Risk  (01/01/2023)   Overall Financial Resource Strain (CARDIA)    Difficulty of Paying Living Expenses: Not hard at all  Food Insecurity: No Food Insecurity (01/22/2023)   Hunger Vital Sign    Worried About Running Out of Food in the Last Year: Never true    Ran Out of Food in the Last Year: Never true  Transportation Needs: No Transportation Needs (01/22/2023)   PRAPARE - Administrator, Civil Service (Medical): No    Lack of Transportation (Non-Medical): No  Physical Activity: Insufficiently Active (01/01/2023)   Exercise Vital Sign    Days of Exercise per Week: 3 days    Minutes of Exercise per Session: 30 min  Stress: No Stress Concern Present (01/01/2023)   [employer] of Occupational Health - Occupational Stress Questionnaire    Feeling of Stress : Not at all  Social Connections: Socially Isolated (01/01/2023)   Social Connection and Isolation Panel [NHANES]    Frequency of Communication with Friends and Family: More than three times a week    Frequency of Social Gatherings with Friends and Family: Twice a week    Attends Religious Services: Never    Database administrator or Organizations: No    Attends Banker Meetings: Never    Marital Status:  Widowed    Tobacco Counseling Counseling given: Not Answered   Clinical Intake:              How often do you need to have someone help you when you read instructions, pamphlets, or other written materials from your doctor or pharmacy?: (P) 1 - Never  Diabetic?No          Activities of Daily Living    03/01/2023    8:43 AM 09/15/2022    1:20 PM  In your present state of health, do you have any difficulty performing the following activities:  Hearing? 0 0  Vision? 0 0  Difficulty concentrating or making decisions? 0 0  Walking or climbing stairs? 1 1  Dressing or bathing? 1 1  Doing errands, shopping? 1 1  Preparing Food and eating ? N   Using the Toilet? N   In the past six months, have you accidently leaked urine? Y   Do you have problems with loss of bowel control? N   Managing your Medications? N   Managing your Finances? N   Housekeeping or managing your Housekeeping? Y     Patient Care Team: Hoy Register, MD as PCP - General (Family Medicine) Lars Masson, MD as PCP - Cardiology (Cardiology) Karie Soda, MD as Consulting Physician (General Surgery) Pyrtle, Carie Caddy, MD as Consulting Physician (Gastroenterology) Chilton Greathouse, MD as Consulting Physician (Pulmonary Disease)  Indicate any recent Medical Services you may have received from other than [facility] providers in the past year (date may be approximate).     Assessment:   This is a routine wellness examination for Kaw[place]i.  Hearing/Vision screen No results found.  Dietary  issues and exercise activities discussed:     Goals Addressed   None    Depression Screen    01/03/2023   11:11 AM 07/04/2022    3:38 PM 04/17/2022    3:06 PM 10/22/2018    3:52 PM 03/17/2018   11:18 AM 01/03/2018    1:32 PM 10/04/2017    1:54 PM  PHQ 2/9 Scores  PHQ - 2 Score 0 0 0 0 0 0 0  PHQ- 9 Score 1  0 3 3 3 1     Fall Risk    03/01/2023    8:43 AM 02/06/2023   10:58 AM 10/04/2022    2:53 PM 07/04/2022     3:30 PM 06/30/2022    2:22 PM  Fall Risk   Falls in the past year? 0 0 0 0 0  Number falls in past yr: 0 0 0 0 0  Injury with Fall? 0 0 0 0 0  Risk for fall due to :   No Fall Risks No Fall Risks     FALL RISK PREVENTION PERTAINING TO THE HOME:  Any stairs in or around the home? {YES/NO:21197} If so, are there any without handrails? {YES/NO:21197} Home free of loose throw rugs in walkways, pet beds, electrical cords, etc? {YES/NO:21197} Adequate lighting in your home to reduce risk of falls? {YES/NO:21197}  ASSISTIVE DEVICES UTILIZED TO PREVENT FALLS:  Life alert? {YES/NO:21197} Use of a cane, walker or w/c? {YES/NO:21197} Grab bars in the bathroom? {YES/NO:21197} Shower chair or bench in shower? {YES/NO:21197} Elevated toilet seat or a handicapped toilet? {YES/NO:21197}  TIMED UP AND GO:  Was the test performed? No . Telephonic visit   Cognitive Function:        Immunizations Immunization History  Administered Date(s) Administered   Fluad Quad(high Dose 65+) 07/20/2019, 07/12/2021, 07/04/2022   Influenza Split 07/07/2013, 07/05/2016   Influenza Whole 11/09/2009   Influenza,inj,Quad PF,6+ Mos 06/24/2015, 07/03/2017, 06/19/2018   Pneumococcal Conjugate-13 09/26/2015   Pneumococcal Polysaccharide-23 10/08/1998, 07/08/2018   Respiratory Syncytial Virus Vaccine,Recomb Aduvanted(Arexvy) 07/05/2022   Td 10/08/2002   Td (Adult), 2 Lf Tetanus Toxid, Preservative Free 10/08/2002   Tdap 09/10/2016    TDAP status: Up to date  Pneumococcal vaccine status: Up to date  Covid-19 vaccine status: Information provided on how to obtain vaccines.   Qualifies for Shingles Vaccine? Yes   Zostavax completed No   Shingrix Completed?: No.    Education has been provided regarding the importance of this vaccine. Patient has been advised to call insurance company to determine out of pocket expense if they have not yet received this vaccine. Advised may also receive vaccine at local  pharmacy or Health Dept. Verbalized acceptance and understanding.  Screening Tests Health Maintenance  Topic Date Due   COVID-19 Vaccine (1) Never done   Zoster Vaccines- Shingrix (1 of 2) Never done   INFLUENZA VACCINE  05/09/2023   Medicare Annual Wellness (AWV)  07/05/2023   MAMMOGRAM  09/27/2023   Lung Cancer Screening  10/30/2023   Fecal DNA (Cologuard)  07/12/2025   DTaP/Tdap/Td (4 - Td or Tdap) 09/10/2026   Pneumonia Vaccine 1+ Years old  Completed   DEXA SCAN  Completed   Hepatitis C Screening  Completed   HPV VACCINES  Aged Out   Colonoscopy  Discontinued    Health Maintenance  Health Maintenance Due  Topic Date Due   COVID-19 Vaccine (1) Never done   Zoster Vaccines- Shingrix (1 of 2) Never done    Colorectal cancer screening:  Type of screening: Cologuard. Completed 07/12/22. Repeat every 3 years  Mammogram status: Completed 09/26/21. Repeat every year  Bone Density status: Completed 01/27/09. Results reflect: Bone density results: OSTEOPOROSIS. Repeat every 2 years.  Lung Cancer Screening: (Low Dose CT Chest recommended if Age 27-80 years, 30 pack-year currently smoking OR have quit w/in 15years.) does qualify.   Lung Cancer Screening Referral: last 10/29/22  Additional Screening:  Hepatitis C Screening: does qualify; Completed 09/26/15  Vision Screening: Recommended annual ophthalmology exams for early detection of glaucoma and other disorders of the eye. Is the patient up to date with their annual eye exam?  {YES/NO:21197} Who is the provider or what is the name of the office in which the patient attends annual eye exams? *** If pt is not established with a provider, would they like to be referred to a provider to establish care? {YES/NO:21197}.   Dental Screening: Recommended annual dental exams for proper oral hygiene  Community Resource Referral / Chronic Care Management: CRR required this visit?  {YES/NO:21197}  CCM required this visit?   {YES/NO:21197}     Plan:     I have personally reviewed and noted the following in the patient's chart:   Medical and social history Use of alcohol, tobacco or illicit drugs  Current medications and supplements including opioid prescriptions. {Opioid Prescriptions:7824914377} Functional ability and status Nutritional status Physical activity Advanced directives List of other physicians Hospitalizations, surgeries, and ER visits in previous 12 months Vitals Screenings to include cognitive, depression, and falls Referrals and appointments  In addition, I have reviewed and discussed with patient certain preventive protocols, quality metrics, and best practice recommendations. A written personalized care plan for preventive services as well as general preventive health recommendations were provided to patient.     Durwin Nora, [place]   5/40/9811   Due to this being a virtual visit, the after visit summary with patients personalized plan was offered to patient via mail or my-chart. ***Patient declined at this time./ Patient would like to access on my-chart/ per request, patient was mailed a copy of AVS./ Patient preferred to pick up at office at next visit  Nurse Notes: ***

## 2023-03-04 NOTE — Patient Instructions (Incomplete)
Margaret Henry , Thank you for taking time to come for your Medicare Wellness Visit. I appreciate your ongoing commitment to your health goals. Please review the following plan we discussed and let me know if I can assist you in the future.   These are the goals we discussed:  Goals      Quit smoking / using tobacco        This is a list of the screening recommended for you and due dates:  Health Maintenance  Topic Date Due   COVID-19 Vaccine (1) Never done   Zoster (Shingles) Vaccine (1 of 2) Never done   Flu Shot  05/09/2023   Medicare Annual Wellness Visit  07/05/2023   Mammogram  09/27/2023   Screening for Lung Cancer  10/30/2023   Cologuard (Stool DNA test)  07/12/2025   DTaP/Tdap/Td vaccine (4 - Td or Tdap) 09/10/2026   Pneumonia Vaccine  Completed   DEXA scan (bone density measurement)  Completed   Hepatitis C Screening  Completed   HPV Vaccine  Aged Out   Colon Cancer Screening  Discontinued    Advanced directives: Information on Advanced Care Planning can be found at [facility] Advance Health Care Directives Advance Health Care Directives ([email or web address]  Please bring a copy of your health care power of attorney and living will to the office to be added to your chart at your convenience.   Conditions/risks identified: Aim for 30 minutes of exercise or brisk walking, 6-8 glasses of water, and 5 servings of fruits and vegetables each day.  Next appointment: Follow up in one year for your annual wellness visit    Preventive Care 65 Years and Older, Female Preventive care refers to lifestyle choices and visits with your health care provider that can promote health and wellness. What does preventive care include? A yearly physical exam. This is also called an annual well check. Dental exams once or twice a year. Routine eye exams. Ask your health care provider how often you should have your eyes checked. Personal lifestyle choices, including: Daily care of  your teeth and gums. Regular physical activity. Eating a healthy diet. Avoiding tobacco and drug use. Limiting alcohol use. Practicing safe sex. Taking low-dose aspirin every day. Taking vitamin and mineral supplements as recommended by your health care provider. What happens during an annual well check? The services and screenings done by your health care provider during your annual well check will depend on your age, overall health, lifestyle risk factors, and family history of disease. Counseling  Your health care provider may ask you questions about your: Alcohol use. Tobacco use. Drug use. Emotional well-being. Home and relationship well-being. Sexual activity. Eating habits. History of falls. Memory and ability to understand (cognition). Work and work Astronomer. Reproductive health. Screening  You may have the following tests or measurements: Height, weight, and BMI. Blood pressure. Lipid and cholesterol levels. These may be checked every 5 years, or more frequently if you are over 72 years old. Skin check. Lung cancer screening. You may have this screening every year starting at age 63 if you have a 30-pack-year history of smoking and currently smoke or have quit within the past 15 years. Fecal occult blood test (FOBT) of the stool. You may have this test every year starting at age 20. Flexible sigmoidoscopy or colonoscopy. You may have a sigmoidoscopy every 5 years or a colonoscopy every 10 years starting at age 34. Hepatitis C blood test. Hepatitis B blood test. Sexually  transmitted disease (STD) testing. Diabetes screening. This is done by checking your blood sugar (glucose) after you have not eaten for a while (fasting). You may have this done every 1-3 years. Bone density scan. This is done to screen for osteoporosis. You may have this done starting at age 53. Mammogram. This may be done every 1-2 years. Talk to your health care provider about how often you should  have regular mammograms. Talk with your health care provider about your test results, treatment options, and if necessary, the need for more tests. Vaccines  Your health care provider may recommend certain vaccines, such as: Influenza vaccine. This is recommended every year. Tetanus, diphtheria, and acellular pertussis (Tdap, Td) vaccine. You may need a Td booster every 10 years. Zoster vaccine. You may need this after age 1. Pneumococcal 13-valent conjugate (PCV13) vaccine. One dose is recommended after age 38. Pneumococcal polysaccharide (PPSV23) vaccine. One dose is recommended after age 78. Talk to your health care provider about which screenings and vaccines you need and how often you need them. This information is not intended to replace advice given to you by your health care provider. Make sure you discuss any questions you have with your health care provider. Document Released: 10/21/2015 Document Revised: 06/13/2016 Document Reviewed: 07/26/2015 Elsevier Interactive Patient Education  2017 [employer].  Fall Prevention in the Home Falls can cause injuries. They can happen to people of all ages. There are many things you can do to make your home safe and to help prevent falls. What can I do on the outside of my home? Regularly fix the edges of walkways and driveways and fix any cracks. Remove anything that might make you trip as you walk through a door, such as a raised step or threshold. Trim any bushes or trees on the path to your home. Use bright outdoor lighting. Clear any walking paths of anything that might make someone trip, such as rocks or tools. Regularly check to see if handrails are loose or broken. Make sure that both sides of any steps have handrails. Any raised decks and porches should have guardrails on the edges. Have any leaves, snow, or ice cleared regularly. Use sand or salt on walking paths during winter. Clean up any spills in your garage right away. This  includes oil or grease spills. What can I do in the bathroom? Use night lights. Install grab bars by the toilet and in the tub and shower. Do not use towel bars as grab bars. Use non-skid mats or decals in the tub or shower. If you need to sit down in the shower, use a plastic, non-slip stool. Keep the floor dry. Clean up any water that spills on the floor as soon as it happens. Remove soap buildup in the tub or shower regularly. Attach bath mats securely with double-sided non-slip rug tape. Do not have throw rugs and other things on the floor that can make you trip. What can I do in the bedroom? Use night lights. Make sure that you have a light by your bed that is easy to reach. Do not use any sheets or blankets that are too big for your bed. They should not hang down onto the floor. Have a firm chair that has side arms. You can use this for support while you get dressed. Do not have throw rugs and other things on the floor that can make you trip. What can I do in the kitchen? Clean up any spills right away. Avoid  walking on wet floors. Keep items that you use a lot in easy-to-reach places. If you need to reach something above you, use a strong step stool that has a grab bar. Keep electrical cords out of the way. Do not use floor polish or wax that makes floors slippery. If you must use wax, use non-skid floor wax. Do not have throw rugs and other things on the floor that can make you trip. What can I do with my stairs? Do not leave any items on the stairs. Make sure that there are handrails on both sides of the stairs and use them. Fix handrails that are broken or loose. Make sure that handrails are as long as the stairways. Check any carpeting to make sure that it is firmly attached to the stairs. Fix any carpet that is loose or worn. Avoid having throw rugs at the top or bottom of the stairs. If you do have throw rugs, attach them to the floor with carpet tape. Make sure that you  have a light switch at the top of the stairs and the bottom of the stairs. If you do not have them, ask someone to add them for you. What else can I do to help prevent falls? Wear shoes that: Do not have high heels. Have rubber bottoms. Are comfortable and fit you well. Are closed at the toe. Do not wear sandals. If you use a stepladder: Make sure that it is fully opened. Do not climb a closed stepladder. Make sure that both sides of the stepladder are locked into place. Ask someone to hold it for you, if possible. Clearly mark and make sure that you can see: Any grab bars or handrails. First and last steps. Where the edge of each step is. Use tools that help you move around (mobility aids) if they are needed. These include: Canes. Walkers. Scooters. Crutches. Turn on the lights when you go into a dark area. Replace any light bulbs as soon as they burn out. Set up your furniture so you have a clear path. Avoid moving your furniture around. If any of your floors are uneven, fix them. If there are any pets around you, be aware of where they are. Review your medicines with your doctor. Some medicines can make you feel dizzy. This can increase your chance of falling. Ask your doctor what other things that you can do to help prevent falls. This information is not intended to replace advice given to you by your health care provider. Make sure you discuss any questions you have with your health care provider. Document Released: 07/21/2009 Document Revised: 03/01/2016 Document Reviewed: 10/29/2014 Elsevier Interactive Patient Education  2017 [employer].

## 2023-03-05 ENCOUNTER — Ambulatory Visit: Payer: Medicare HMO | Attending: Family Medicine

## 2023-03-05 ENCOUNTER — Other Ambulatory Visit: Payer: Self-pay | Admitting: Orthopedic Surgery

## 2023-03-05 ENCOUNTER — Telehealth: Payer: Self-pay

## 2023-03-05 ENCOUNTER — Other Ambulatory Visit (HOSPITAL_BASED_OUTPATIENT_CLINIC_OR_DEPARTMENT_OTHER): Payer: Self-pay

## 2023-03-05 VITALS — Ht 61.0 in | Wt 107.0 lb

## 2023-03-05 DIAGNOSIS — Z0000 Encounter for general adult medical examination without abnormal findings: Secondary | ICD-10-CM

## 2023-03-05 DIAGNOSIS — Z1231 Encounter for screening mammogram for malignant neoplasm of breast: Secondary | ICD-10-CM

## 2023-03-05 MED ORDER — TRAMADOL HCL 50 MG PO TABS
50.0000 mg | ORAL_TABLET | Freq: Four times a day (QID) | ORAL | 0 refills | Status: DC | PRN
  Filled 2023-03-05: qty 20, 5d supply, fill #0

## 2023-03-05 NOTE — Telephone Encounter (Signed)
Patient seen for AWV and was requesting a refill of weekly Vitamin D; stating that the refill had been denied.  I informed her that I was unsure if she had completed the course of this medication but would check.  Please advise.

## 2023-03-05 NOTE — Telephone Encounter (Signed)
Vitamin D 50,000 unit is not a chronic medication to be taking indefinitely.  Her last vitamin D level has normalized and she no longer needs a huge dose of vitamin D but can take the 1000 IU daily. Thanks

## 2023-03-06 ENCOUNTER — Other Ambulatory Visit: Payer: Self-pay

## 2023-03-06 ENCOUNTER — Other Ambulatory Visit (HOSPITAL_BASED_OUTPATIENT_CLINIC_OR_DEPARTMENT_OTHER): Payer: Self-pay

## 2023-03-18 ENCOUNTER — Ambulatory Visit (HOSPITAL_BASED_OUTPATIENT_CLINIC_OR_DEPARTMENT_OTHER)
Admission: RE | Admit: 2023-03-18 | Discharge: 2023-03-18 | Disposition: A | Discharge status: Home / Self Care | Payer: Medicare HMO | Source: Ambulatory Visit | Attending: Family Medicine | Admitting: Family Medicine

## 2023-03-18 ENCOUNTER — Encounter: Payer: Self-pay | Admitting: Family Medicine

## 2023-03-18 ENCOUNTER — Encounter (HOSPITAL_BASED_OUTPATIENT_CLINIC_OR_DEPARTMENT_OTHER): Payer: Self-pay

## 2023-03-18 DIAGNOSIS — Z1231 Encounter for screening mammogram for malignant neoplasm of breast: Secondary | ICD-10-CM | POA: Insufficient documentation

## 2023-03-20 ENCOUNTER — Other Ambulatory Visit: Payer: Self-pay | Admitting: Family Medicine

## 2023-03-20 DIAGNOSIS — R928 Other abnormal and inconclusive findings on diagnostic imaging of breast: Secondary | ICD-10-CM

## 2023-03-29 ENCOUNTER — Ambulatory Visit: Payer: Medicare HMO

## 2023-03-29 ENCOUNTER — Ambulatory Visit
Admission: RE | Admit: 2023-03-29 | Discharge: 2023-03-29 | Disposition: A | Discharge status: Home / Self Care | Payer: Medicare HMO | Source: Ambulatory Visit | Attending: Family Medicine | Admitting: Family Medicine

## 2023-03-29 DIAGNOSIS — R928 Other abnormal and inconclusive findings on diagnostic imaging of breast: Secondary | ICD-10-CM

## 2023-03-29 DIAGNOSIS — R921 Mammographic calcification found on diagnostic imaging of breast: Secondary | ICD-10-CM | POA: Diagnosis not present

## 2023-04-07 ENCOUNTER — Other Ambulatory Visit: Payer: Self-pay | Admitting: Orthopedic Surgery

## 2023-04-08 ENCOUNTER — Other Ambulatory Visit: Payer: Self-pay

## 2023-04-08 ENCOUNTER — Other Ambulatory Visit (HOSPITAL_BASED_OUTPATIENT_CLINIC_OR_DEPARTMENT_OTHER): Payer: Self-pay

## 2023-04-08 MED ORDER — TRAMADOL HCL 50 MG PO TABS
50.0000 mg | ORAL_TABLET | Freq: Four times a day (QID) | ORAL | 0 refills | Status: DC | PRN
  Filled 2023-04-08: qty 20, 5d supply, fill #0

## 2023-04-22 ENCOUNTER — Other Ambulatory Visit (HOSPITAL_BASED_OUTPATIENT_CLINIC_OR_DEPARTMENT_OTHER): Payer: Self-pay

## 2023-04-22 ENCOUNTER — Other Ambulatory Visit: Payer: Self-pay | Admitting: Family Medicine

## 2023-04-22 ENCOUNTER — Ambulatory Visit: Payer: Medicare HMO

## 2023-04-22 ENCOUNTER — Ambulatory Visit: Payer: Medicare HMO | Admitting: Pulmonary Disease

## 2023-04-22 ENCOUNTER — Encounter: Payer: Self-pay | Admitting: Pulmonary Disease

## 2023-04-22 VITALS — BP 136/62 | HR 120 | Temp 98.5°F | Ht 64.0 in | Wt 105.2 lb

## 2023-04-22 DIAGNOSIS — R062 Wheezing: Secondary | ICD-10-CM | POA: Diagnosis not present

## 2023-04-22 DIAGNOSIS — I700 Atherosclerosis of aorta: Secondary | ICD-10-CM | POA: Diagnosis not present

## 2023-04-22 DIAGNOSIS — J441 Chronic obstructive pulmonary disease with (acute) exacerbation: Secondary | ICD-10-CM

## 2023-04-22 DIAGNOSIS — N6489 Other specified disorders of breast: Secondary | ICD-10-CM

## 2023-04-22 DIAGNOSIS — R0602 Shortness of breath: Secondary | ICD-10-CM

## 2023-04-22 DIAGNOSIS — R921 Mammographic calcification found on diagnostic imaging of breast: Secondary | ICD-10-CM

## 2023-04-22 DIAGNOSIS — J439 Emphysema, unspecified: Secondary | ICD-10-CM | POA: Diagnosis not present

## 2023-04-22 LAB — CBC WITH DIFFERENTIAL/PLATELET
Basophils Absolute: 0.1 10*3/uL (ref 0.0–0.1)
Basophils Relative: 0.6 % (ref 0.0–3.0)
Eosinophils Absolute: 0.3 10*3/uL (ref 0.0–0.7)
Eosinophils Relative: 2.8 % (ref 0.0–5.0)
HCT: 36.8 % (ref 36.0–46.0)
Hemoglobin: 11.9 g/dL — ABNORMAL LOW (ref 12.0–15.0)
Lymphocytes Relative: 11.1 % — ABNORMAL LOW (ref 12.0–46.0)
Lymphs Abs: 1.1 10*3/uL (ref 0.7–4.0)
MCHC: 32.4 g/dL (ref 30.0–36.0)
MCV: 92.2 fl (ref 78.0–100.0)
Monocytes Absolute: 0.9 10*3/uL (ref 0.1–1.0)
Monocytes Relative: 9.2 % (ref 3.0–12.0)
Neutro Abs: 7.6 10*3/uL (ref 1.4–7.7)
Neutrophils Relative %: 76.3 % (ref 43.0–77.0)
Platelets: 258 10*3/uL (ref 150.0–400.0)
RBC: 3.99 Mil/uL (ref 3.87–5.11)
RDW: 14.8 % (ref 11.5–15.5)
WBC: 10 10*3/uL (ref 4.0–10.5)

## 2023-04-22 LAB — TSH: TSH: 0.51 u[IU]/mL (ref 0.35–5.50)

## 2023-04-22 MED ORDER — PREDNISONE 10 MG PO TABS
10.0000 mg | ORAL_TABLET | Freq: Every day | ORAL | 1 refills | Status: DC
  Filled 2023-04-22: qty 30, 30d supply, fill #0

## 2023-04-22 NOTE — Patient Instructions (Addendum)
Will get a chest x-ray today Start prednisone 10 mg a day for 5 days Use check labs today including CBC with differential, TSH, CMP Will change her CT which is scheduled to [facility] as it is closer to her home Follow-up in 6 months

## 2023-04-22 NOTE — Progress Notes (Signed)
Margaret Henry    [id number]    June 14, 1951  Primary Care Physician:Newlin, Odette Horns, MD  Referring Physician: Hoy Register, MD [address] [place] [address] [place],  [place] [address]  Chief complaint:   Follow up for COPD GOLD D  HPI: Margaret Henry is a 72 y.o. 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 Her symptoms consist mainly of shortness of breath, daily cough with whitish sputum production, wheeze, dyspnea and exertion. She does not have any hemoptysis, chest pain, palpitations. No fevers, chills.   She is currently maintained on Spiriva and breo.  Tried on Trelegy inhaler in 2019 but had to go back to [place] and Spiriva since it made her breathing worse.  Also tried on Daliresp in 2019 but had to stop due to side effects of nausea  Continues on Breo and Spiriva.  She had tried Trelegy again in early 2022 but it made her breathing worse She got a course of prednisone in March 2022 for mild COPD exacerbation. Has been hospitalized twice this year and Nov 2023 and December 2023 for COPD exacerbation  She has been struggling with recurrent COPD exacerbations over the past few years. On maximal therapy with inhalers, chronic prednisone and daily azithromycin She had smoked one pack per day for nearly 25 years. Quit in 2020. 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 She does not recall any particular exposures at work or at home.  Interim history: Continues on inhalers, azithromycin to 250 mg every other day and is off prednisone completely She had been feeling well for the past 6 months.  However for the past 2 weeks she notes increasing dyspnea, fatigue.  Denies cough, wheezing, congestion.  Outpatient Encounter Medications as of 04/22/2023  Medication Sig   alendronate (FOSAMAX) 70 MG tablet Take 1 tablet (70 mg total) by mouth every 7 (seven) days. Take with a full glass of water on an empty stomach.    atorvastatin (LIPITOR) 40 MG tablet TAKE 1 TABLET EVERY DAY   azithromycin (ZITHROMAX) 250 MG tablet Take 1 tablet (250 mg total) by mouth daily. (Patient taking differently: Take 250 mg by mouth daily. Taking every other day)   bismuth subsalicylate (PEPTO BISMOL) 262 MG/15ML suspension Take 30 mLs by mouth every 6 (six) hours as needed for indigestion.   BREO ELLIPTA 200-25 MCG/ACT AEPB INHALE 1 PUFF EVERY DAY   ferrous sulfate 325 (65 FE) MG EC tablet Take 325 mg by mouth every other day.   fluticasone (FLONASE) 50 MCG/ACT nasal spray Place 2 sprays into both nostrils daily.   ipratropium-albuterol (DUONEB) 0.5-2.5 (3) MG/3ML SOLN INHALE THE CONTENTS OF 1 VIAL VIA NEBULIZER EVERY 6 HOURS AS NEEDED   loratadine (CLARITIN) 10 MG tablet Take 1 tablet (10 mg total) by mouth daily.   mupirocin ointment (BACTROBAN) 2 % Apply 1 Application topically 2 (two) times daily. To affected nostril (Patient taking differently: Place 1 Application into the nose See admin instructions. Apply to affected (inner) nostril(s) one to two times a day)   nitroGLYCERIN (NITROSTAT) 0.4 MG SL tablet Place 1 tablet (0.4 mg total) under the tongue every 5 (five) minutes as needed for chest pain.   OXYGEN Inhale 3 L/min into the lungs continuous.   Tiotropium Bromide Monohydrate (SPIRIVA RESPIMAT) 2.5 MCG/ACT AERS INHALE 2 PUFFS EVERY DAY (Patient taking differently: Take 2 each by mouth daily.)   vitamin B-12 (  CYANOCOBALAMIN) 100 MCG tablet Take 100 mcg by mouth daily.   pantoprazole (PROTONIX) 20 MG tablet Take 1 tablet (20 mg total) by mouth daily. (Patient not taking: Reported on 04/22/2023)   terbinafine (LAMISIL) 250 MG tablet Take 1 tablet (250 mg total) by mouth daily. (Patient not taking: Reported on 04/22/2023)   Vitamin D, Ergocalciferol, (DRISDOL) 1.25 MG (50000 UNIT) CAPS capsule Take 50,000 Units by mouth every 7 (seven) days. (Patient not taking: Reported on 04/22/2023)   [DISCONTINUED] benzonatate (TESSALON) 200 MG  capsule Take 1 capsule (200 mg total) by mouth 3 (three) times daily as needed for cough.   [DISCONTINUED] doxycycline (VIBRAMYCIN) 100 MG capsule Take 1 capsule (100 mg total) by mouth 2 (two) times daily.   No facility-administered encounter medications on file as of 04/22/2023.   Physical Exam: Blood pressure 136/62, pulse (!) 120, temperature 98.5 F (36.9 C), temperature source Oral, height 5\' 4"  (1.626 m), weight 105 lb 3.2 oz (47.7 kg), SpO2 96%. Gen:      No acute distress HEENT:  EOMI, sclera anicteric Neck:     No masses; no thyromegaly Lungs:    Clear to auscultation bilaterally; normal respiratory effort CV:         Regular rate and rhythm; no murmurs Abd:      + bowel sounds; soft, non-tender; no palpable masses, no distension Ext:    No edema; adequate peripheral perfusion Skin:      Warm and dry; no rash Neuro: alert and oriented x 3 Psych: normal mood and affect   Data Reviewed: Imaging: Screening CT chest 03/07/18- calcified right thyroid nodule.  Severe centrilobular emphysema, bronchial wall thickening.  Previously described 4.8 mm right lower lobe nodule now measures 2.9 mm.  Granulomatous splenic and liver calcification.  CT 10/20/2019-resolution of left lower lobe pulmonary nodule, no other lung abnormality   Low-dose screening CT 10/20/2020-new 5.9 mm right lower lobe pulmonary nodule, emphysema, atherosclerosis, gallstones.  Low-dose screening CT 04/24/2021-improvement of right lower lobe pulmonary nodule, emphysema  Low-dose screening CT 07/27/2022-severe emphysema, interval resolution of left lower lobe subpleural nodule, new nodular density in the left upper lobe. I have reviewed the images personally.   PFTs (07/21/15) FVC 2.06 66%), FEV1 0.93 (39%), F/F 45, DLCO 36% Unable complete pleth. Severe obstructive lung disease, severe diffusion impairment  Spirometry 04/12/16 FVC 1.79 [5 7%), FEV1 0.65 (27%), F/F 36 Very severe obstructive airway  disease  Labs: CBC 12/03/2018-WBC 9.2, eos 0% ID 10/16/18-1931 Alpha-1 antitrypsin 11/05/2018-171, PI MM  Sputum culture 05/29/2022-Pseudomonas  Assessment:  Severe COPD,Gold Stage D, Chronic bronchitis Continues on Breo and Spiriva.   I am not sure if she is generating enough inspiratory flow for the inhalers.  We discussed switching over to nebulizers but she would like to hold off on this change for now. Has not tolerated Daliresp due to side effects Continue supplemental oxygen She has finished pulmonary rehab 2 times already.  Encouraged her to stay active at home  On chronic azithromycin every other day Continue flutter valve daily and hypertonic saline to help with mucociliary clearance  I am not sure why she is more fatigued over the past 2 weeks.  Will get a chest x-ray today and give prednisone for 5 days.  She does not want a dose greater than 10 mg as she does not tolerate higher dose Check CBC, TSH and CMP for evaluation of dyspnea, fatigue  Pulmonary nodule Continue annual low-dose screening CT  Goals of care We discussed her overall  worsening respiratory status and frequent exacerbations.  Palliative care visit is pending. Does not want to consider DNI yet but does say that she would not want to be supported long-term if there is no recovery  Plan/Recommendations: - Continue Breo, Spiriva - Chronic azithromycin - Prednisone for 5 days, chest x-ray - Labs - Cough syrup - Flutter valve, hypertonic saline  Chilton Greathouse MD [facility] 04/22/2023, 11:09 AM  CC: Hoy Register, MD

## 2023-04-22 NOTE — Progress Notes (Signed)
Margaret Henry    [id number]    [birth year]  Primary Care Physician:Newlin, Odette Horns, MD  Referring Physician: Hoy Register, MD [address] [facility] [address] [place],  [place] [address]  Chief complaint:   Follow up for COPD GOLD D  HPI: Margaret Henry is a 72 y.o. 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 Her symptoms consist mainly of shortness of breath, daily cough with whitish sputum production, wheeze, dyspnea and exertion. She does not have any hemoptysis, chest pain, palpitations. No fevers, chills.   She is currently maintained on Spiriva and breo.  Tried on Trelegy inhaler in 2019 but had to go back to [place] and Spiriva since it made her breathing worse.  Also tried on Daliresp in 2019 but had to stop due to side effects of nausea  Continues on Breo and Spiriva She had tried Trelegy again in early 2022 but it made her breathing worse She got a course of prednisone in March 2022 for mild COPD exacerbation.  She has been struggling with recurrent COPD exacerbations over the past few years. On maximal therapy with inhalers, chronic prednisone and daily azithromycin She had smoked one pack per day for nearly 25 years. Quit in 2020. 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 She does not recall any particular exposures at work or at home.  Interim history: Has been hospitalized twice this year and Nov 2023 and December 2023 for COPD exacerbation Continues on inhalers, azithromycin to 50 mg a day and is on prednisone taper.  Outpatient Encounter Medications as of 04/22/2023  Medication Sig   alendronate (FOSAMAX) 70 MG tablet Take 1 tablet (70 mg total) by mouth every 7 (seven) days. Take with a full glass of water on an empty stomach.   atorvastatin (LIPITOR) 40 MG tablet TAKE 1 TABLET EVERY DAY   azithromycin (ZITHROMAX) 250 MG tablet Take 1 tablet (250 mg total) by mouth daily. (Patient taking differently:  Take 250 mg by mouth daily. Taking every other day)   bismuth subsalicylate (PEPTO BISMOL) 262 MG/15ML suspension Take 30 mLs by mouth every 6 (six) hours as needed for indigestion.   BREO ELLIPTA 200-25 MCG/ACT AEPB INHALE 1 PUFF EVERY DAY   ferrous sulfate 325 (65 FE) MG EC tablet Take 325 mg by mouth every other day.   fluticasone (FLONASE) 50 MCG/ACT nasal spray Place 2 sprays into both nostrils daily.   ipratropium-albuterol (DUONEB) 0.5-2.5 (3) MG/3ML SOLN INHALE THE CONTENTS OF 1 VIAL VIA NEBULIZER EVERY 6 HOURS AS NEEDED   loratadine (CLARITIN) 10 MG tablet Take 1 tablet (10 mg total) by mouth daily.   mupirocin ointment (BACTROBAN) 2 % Apply 1 Application topically 2 (two) times daily. To affected nostril (Patient taking differently: Place 1 Application into the nose See admin instructions. Apply to affected (inner) nostril(s) one to two times a day)   nitroGLYCERIN (NITROSTAT) 0.4 MG SL tablet Place 1 tablet (0.4 mg total) under the tongue every 5 (five) minutes as needed for chest pain.   OXYGEN Inhale 3 L/min into the lungs continuous.   Tiotropium Bromide Monohydrate (SPIRIVA RESPIMAT) 2.5 MCG/ACT AERS INHALE 2 PUFFS EVERY DAY (Patient taking differently: Take 2 each by mouth daily.)   vitamin B-12 (CYANOCOBALAMIN) 100 MCG tablet Take 100 mcg by mouth daily.   pantoprazole (PROTONIX) 20 MG tablet Take 1 tablet (20 mg total) by mouth daily. (Patient not taking:  Reported on 04/22/2023)   terbinafine (LAMISIL) 250 MG tablet Take 1 tablet (250 mg total) by mouth daily. (Patient not taking: Reported on 04/22/2023)   Vitamin D, Ergocalciferol, (DRISDOL) 1.25 MG (50000 UNIT) CAPS capsule Take 50,000 Units by mouth every 7 (seven) days. (Patient not taking: Reported on 04/22/2023)   [DISCONTINUED] benzonatate (TESSALON) 200 MG capsule Take 1 capsule (200 mg total) by mouth 3 (three) times daily as needed for cough.   [DISCONTINUED] doxycycline (VIBRAMYCIN) 100 MG capsule Take 1 capsule (100 mg  total) by mouth 2 (two) times daily.   No facility-administered encounter medications on file as of 04/22/2023.   Physical Exam: Blood pressure (!) 146/70, pulse 72, temperature 98.3 F (36.8 C), temperature source Oral, height 5\' 4"  (1.626 m), weight 111 lb 9.6 oz (50.6 kg), SpO2 90 %. Gen:      No acute distress HEENT:  EOMI, sclera anicteric Neck:     No masses; no thyromegaly Lungs:    Diminished air entry CV:         Regular rate and rhythm; no murmurs Abd:      + bowel sounds; soft, non-tender; no palpable masses, no distension Ext:    No edema; adequate peripheral perfusion Skin:      Warm and dry; no rash Neuro: alert and oriented x 3 Psych: normal mood and affect   Data Reviewed: Imaging: Screening CT chest 03/07/18- calcified right thyroid nodule.  Severe centrilobular emphysema, bronchial wall thickening.  Previously described 4.8 mm right lower lobe nodule now measures 2.9 mm.  Granulomatous splenic and liver calcification.  CT 10/20/2019-resolution of left lower lobe pulmonary nodule, no other lung abnormality   Low-dose screening CT 10/20/2020-new 5.9 mm right lower lobe pulmonary nodule, emphysema, atherosclerosis, gallstones.  Low-dose screening CT 04/24/2021-improvement of right lower lobe pulmonary nodule, emphysema  Low-dose screening CT 07/27/2022-severe emphysema, interval resolution of left lower lobe subpleural nodule, new nodular density in the left upper lobe. I have reviewed the images personally.   PFTs (07/21/15) FVC 2.06 66%), FEV1 0.93 (39%), F/F 45, DLCO 36% Unable complete pleth. Severe obstructive lung disease, severe diffusion impairment  Spirometry 04/12/16 FVC 1.79 [5 7%), FEV1 0.65 (27%), F/F 36 Very severe obstructive airway disease  Labs: CBC 12/03/2018-WBC 9.2, eos 0% ID 10/16/18-1931 Alpha-1 antitrypsin 11/05/2018-171, PI MM  Sputum culture 05/29/2022-Pseudomonas  Assessment:  Severe COPD,Gold Stage D, Chronic bronchitis Continues on Breo  and Spiriva.   I am not sure if she is generating enough inspiratory flow for the inhalers.  We discussed switching over to nebulizers but she would like to hold off on this change for now. Has not tolerated Daliresp due to side effects Continue supplemental oxygen She has finished pulmonary rehab 2 times already.  Encouraged her to stay active at home  Unfortunately she is suffering with recurrent exacerbations over the past year.  On daily azithromycin Continue flutter valve daily and hypertonic saline to help with mucociliary clearance  Pulmonary nodule Continue annual low-dose screening CT Has short-term follow-up in later part of January 2024 for new nodules noted on previous scan October 2023  Goals of care We discussed her overall worsening respiratory status and frequent exacerbations.  Palliative care visit is pending. Does not want to consider DNI yet but does say that she would not want to be supported long-term if there is no recovery  Plan/Recommendations: - Continue Breo, Spiriva - Daily azithromycin - Cough syrup - Flutter valve, hypertonic saline - Follow-up CT scan already ordered  Jla Reynolds  Teressa Mcglocklin MD [facility] 04/22/2023, 11:13 AM  CC: Hoy Register, MD

## 2023-04-23 LAB — COMPREHENSIVE METABOLIC PANEL
ALT: 9 U/L (ref 0–35)
AST: 16 U/L (ref 0–37)
Albumin: 4.4 g/dL (ref 3.5–5.2)
Alkaline Phosphatase: 65 U/L (ref 39–117)
BUN: 17 mg/dL (ref 6–23)
CO2: 28 mEq/L (ref 19–32)
Calcium: 9.9 mg/dL (ref 8.4–10.5)
Chloride: 105 mEq/L (ref 96–112)
Creatinine, Ser: 0.52 mg/dL (ref 0.40–1.20)
GFR: 92.5 mL/min (ref >60.00)
Glucose, Bld: 112 mg/dL — ABNORMAL HIGH (ref 70–99)
Potassium: 3.6 mEq/L (ref 3.5–5.1)
Sodium: 141 mEq/L (ref 135–145)
Total Bilirubin: 0.3 mg/dL (ref 0.2–1.2)
Total Protein: 7.3 g/dL (ref 6.0–8.3)

## 2023-05-02 ENCOUNTER — Other Ambulatory Visit: Payer: Medicare HMO

## 2023-05-02 ENCOUNTER — Ambulatory Visit (HOSPITAL_BASED_OUTPATIENT_CLINIC_OR_DEPARTMENT_OTHER)
Admission: RE | Admit: 2023-05-02 | Discharge: 2023-05-02 | Disposition: A | Discharge status: Home / Self Care | Payer: Medicare HMO | Source: Ambulatory Visit | Attending: Acute Care | Admitting: Acute Care

## 2023-05-02 DIAGNOSIS — Z87891 Personal history of nicotine dependence: Secondary | ICD-10-CM | POA: Insufficient documentation

## 2023-05-02 DIAGNOSIS — R911 Solitary pulmonary nodule: Secondary | ICD-10-CM | POA: Diagnosis not present

## 2023-05-10 ENCOUNTER — Other Ambulatory Visit: Payer: Self-pay | Admitting: Acute Care

## 2023-05-10 DIAGNOSIS — Z87891 Personal history of nicotine dependence: Secondary | ICD-10-CM

## 2023-05-10 DIAGNOSIS — Z122 Encounter for screening for malignant neoplasm of respiratory organs: Secondary | ICD-10-CM

## 2023-05-13 ENCOUNTER — Other Ambulatory Visit: Payer: Self-pay | Admitting: Orthopedic Surgery

## 2023-05-13 ENCOUNTER — Other Ambulatory Visit: Payer: Self-pay

## 2023-05-13 ENCOUNTER — Other Ambulatory Visit: Payer: Self-pay | Admitting: Pulmonary Disease

## 2023-05-13 ENCOUNTER — Other Ambulatory Visit (HOSPITAL_BASED_OUTPATIENT_CLINIC_OR_DEPARTMENT_OTHER): Payer: Self-pay

## 2023-05-13 MED ORDER — TRAMADOL HCL 50 MG PO TABS
50.0000 mg | ORAL_TABLET | Freq: Four times a day (QID) | ORAL | 0 refills | Status: AC | PRN
  Filled 2023-05-13: qty 20, 5d supply, fill #0

## 2023-05-21 ENCOUNTER — Other Ambulatory Visit: Payer: Self-pay

## 2023-05-21 ENCOUNTER — Emergency Department (HOSPITAL_COMMUNITY): Payer: Medicare HMO

## 2023-05-21 ENCOUNTER — Encounter (HOSPITAL_COMMUNITY): Payer: Self-pay | Admitting: Family Medicine

## 2023-05-21 ENCOUNTER — Inpatient Hospital Stay (HOSPITAL_COMMUNITY)
Admission: EM | Admit: 2023-05-21 | Discharge: 2023-05-24 | DRG: 191 | Disposition: A | Discharge status: Home / Self Care | Payer: Medicare HMO | Attending: Internal Medicine | Admitting: Internal Medicine

## 2023-05-21 DIAGNOSIS — E7800 Pure hypercholesterolemia, unspecified: Secondary | ICD-10-CM | POA: Diagnosis present

## 2023-05-21 DIAGNOSIS — R000 Tachycardia, unspecified: Secondary | ICD-10-CM | POA: Diagnosis not present

## 2023-05-21 DIAGNOSIS — J439 Emphysema, unspecified: Secondary | ICD-10-CM | POA: Diagnosis not present

## 2023-05-21 DIAGNOSIS — J441 Chronic obstructive pulmonary disease with (acute) exacerbation: Principal | ICD-10-CM | POA: Diagnosis present

## 2023-05-21 DIAGNOSIS — R54 Age-related physical debility: Secondary | ICD-10-CM | POA: Diagnosis present

## 2023-05-21 DIAGNOSIS — I700 Atherosclerosis of aorta: Secondary | ICD-10-CM | POA: Diagnosis not present

## 2023-05-21 DIAGNOSIS — J449 Chronic obstructive pulmonary disease, unspecified: Secondary | ICD-10-CM | POA: Diagnosis not present

## 2023-05-21 DIAGNOSIS — R6520 Severe sepsis without septic shock: Secondary | ICD-10-CM | POA: Diagnosis not present

## 2023-05-21 DIAGNOSIS — E46 Unspecified protein-calorie malnutrition: Secondary | ICD-10-CM | POA: Diagnosis present

## 2023-05-21 DIAGNOSIS — I2510 Atherosclerotic heart disease of native coronary artery without angina pectoris: Secondary | ICD-10-CM | POA: Diagnosis present

## 2023-05-21 DIAGNOSIS — Z833 Family history of diabetes mellitus: Secondary | ICD-10-CM

## 2023-05-21 DIAGNOSIS — Z85828 Personal history of other malignant neoplasm of skin: Secondary | ICD-10-CM | POA: Diagnosis not present

## 2023-05-21 DIAGNOSIS — M545 Low back pain, unspecified: Secondary | ICD-10-CM | POA: Diagnosis present

## 2023-05-21 DIAGNOSIS — Z9981 Dependence on supplemental oxygen: Secondary | ICD-10-CM | POA: Diagnosis not present

## 2023-05-21 DIAGNOSIS — J9611 Chronic respiratory failure with hypoxia: Secondary | ICD-10-CM | POA: Diagnosis not present

## 2023-05-21 DIAGNOSIS — Z8249 Family history of ischemic heart disease and other diseases of the circulatory system: Secondary | ICD-10-CM | POA: Diagnosis not present

## 2023-05-21 DIAGNOSIS — Z792 Long term (current) use of antibiotics: Secondary | ICD-10-CM | POA: Diagnosis not present

## 2023-05-21 DIAGNOSIS — M810 Age-related osteoporosis without current pathological fracture: Secondary | ICD-10-CM | POA: Diagnosis present

## 2023-05-21 DIAGNOSIS — Z8719 Personal history of other diseases of the digestive system: Secondary | ICD-10-CM

## 2023-05-21 DIAGNOSIS — Z801 Family history of malignant neoplasm of trachea, bronchus and lung: Secondary | ICD-10-CM

## 2023-05-21 DIAGNOSIS — R059 Cough, unspecified: Secondary | ICD-10-CM | POA: Diagnosis not present

## 2023-05-21 DIAGNOSIS — Z79899 Other long term (current) drug therapy: Secondary | ICD-10-CM

## 2023-05-21 DIAGNOSIS — G8929 Other chronic pain: Secondary | ICD-10-CM | POA: Diagnosis present

## 2023-05-21 DIAGNOSIS — J438 Other emphysema: Secondary | ICD-10-CM | POA: Diagnosis not present

## 2023-05-21 DIAGNOSIS — Z885 Allergy status to narcotic agent status: Secondary | ICD-10-CM

## 2023-05-21 DIAGNOSIS — Z7952 Long term (current) use of systemic steroids: Secondary | ICD-10-CM

## 2023-05-21 DIAGNOSIS — A419 Sepsis, unspecified organism: Secondary | ICD-10-CM | POA: Diagnosis not present

## 2023-05-21 DIAGNOSIS — Z1152 Encounter for screening for COVID-19: Secondary | ICD-10-CM

## 2023-05-21 DIAGNOSIS — R051 Acute cough: Secondary | ICD-10-CM | POA: Diagnosis not present

## 2023-05-21 DIAGNOSIS — Z90711 Acquired absence of uterus with remaining cervical stump: Secondary | ICD-10-CM

## 2023-05-21 DIAGNOSIS — E876 Hypokalemia: Secondary | ICD-10-CM | POA: Diagnosis present

## 2023-05-21 DIAGNOSIS — Z87891 Personal history of nicotine dependence: Secondary | ICD-10-CM

## 2023-05-21 DIAGNOSIS — J9610 Chronic respiratory failure, unspecified whether with hypoxia or hypercapnia: Secondary | ICD-10-CM | POA: Diagnosis present

## 2023-05-21 DIAGNOSIS — Z880 Allergy status to penicillin: Secondary | ICD-10-CM

## 2023-05-21 DIAGNOSIS — R0602 Shortness of breath: Secondary | ICD-10-CM | POA: Diagnosis not present

## 2023-05-21 DIAGNOSIS — Z825 Family history of asthma and other chronic lower respiratory diseases: Secondary | ICD-10-CM

## 2023-05-21 DIAGNOSIS — K8020 Calculus of gallbladder without cholecystitis without obstruction: Secondary | ICD-10-CM | POA: Diagnosis not present

## 2023-05-21 DIAGNOSIS — E440 Moderate protein-calorie malnutrition: Secondary | ICD-10-CM | POA: Insufficient documentation

## 2023-05-21 DIAGNOSIS — J9612 Chronic respiratory failure with hypercapnia: Secondary | ICD-10-CM | POA: Diagnosis not present

## 2023-05-21 DIAGNOSIS — Z7951 Long term (current) use of inhaled steroids: Secondary | ICD-10-CM

## 2023-05-21 DIAGNOSIS — K219 Gastro-esophageal reflux disease without esophagitis: Secondary | ICD-10-CM | POA: Diagnosis present

## 2023-05-21 DIAGNOSIS — J40 Bronchitis, not specified as acute or chronic: Secondary | ICD-10-CM | POA: Diagnosis not present

## 2023-05-21 DIAGNOSIS — E538 Deficiency of other specified B group vitamins: Secondary | ICD-10-CM | POA: Diagnosis present

## 2023-05-21 DIAGNOSIS — R052 Subacute cough: Secondary | ICD-10-CM

## 2023-05-21 DIAGNOSIS — J9601 Acute respiratory failure with hypoxia: Secondary | ICD-10-CM | POA: Diagnosis not present

## 2023-05-21 DIAGNOSIS — Z800 Family history of malignant neoplasm of digestive organs: Secondary | ICD-10-CM

## 2023-05-21 DIAGNOSIS — E43 Unspecified severe protein-calorie malnutrition: Secondary | ICD-10-CM | POA: Diagnosis not present

## 2023-05-21 DIAGNOSIS — R002 Palpitations: Secondary | ICD-10-CM | POA: Diagnosis present

## 2023-05-21 DIAGNOSIS — R918 Other nonspecific abnormal finding of lung field: Secondary | ICD-10-CM | POA: Diagnosis not present

## 2023-05-21 DIAGNOSIS — Z681 Body mass index (BMI) 19 or less, adult: Secondary | ICD-10-CM

## 2023-05-21 DIAGNOSIS — J410 Simple chronic bronchitis: Secondary | ICD-10-CM | POA: Diagnosis not present

## 2023-05-21 DIAGNOSIS — R339 Retention of urine, unspecified: Secondary | ICD-10-CM | POA: Diagnosis not present

## 2023-05-21 DIAGNOSIS — Z886 Allergy status to analgesic agent status: Secondary | ICD-10-CM

## 2023-05-21 DIAGNOSIS — G4489 Other headache syndrome: Secondary | ICD-10-CM | POA: Diagnosis not present

## 2023-05-21 DIAGNOSIS — E785 Hyperlipidemia, unspecified: Secondary | ICD-10-CM | POA: Diagnosis present

## 2023-05-21 DIAGNOSIS — R058 Other specified cough: Secondary | ICD-10-CM | POA: Diagnosis not present

## 2023-05-21 LAB — CBC
HCT: 38.9 % (ref 36.0–46.0)
Hemoglobin: 12.6 g/dL (ref 12.0–15.0)
MCH: 30.5 pg (ref 26.0–34.0)
MCHC: 32.4 g/dL (ref 30.0–36.0)
MCV: 94.2 fL (ref 80.0–100.0)
Platelets: 259 10*3/uL (ref 150–400)
RBC: 4.13 MIL/uL (ref 3.87–5.11)
RDW: 13.9 % (ref 11.5–15.5)
WBC: 16 10*3/uL — ABNORMAL HIGH (ref 4.0–10.5)
nRBC: 0 % (ref 0.0–0.2)

## 2023-05-21 LAB — URINALYSIS, W/ REFLEX TO CULTURE (INFECTION SUSPECTED)
Bilirubin Urine: NEGATIVE
Glucose, UA: NEGATIVE mg/dL
Hgb urine dipstick: NEGATIVE
Ketones, ur: 20 mg/dL — AB
Nitrite: NEGATIVE
Protein, ur: NEGATIVE mg/dL
Specific Gravity, Urine: 1.015 (ref 1.005–1.030)
pH: 8 (ref 5.0–8.0)

## 2023-05-21 LAB — RESP PANEL BY RT-PCR (RSV, FLU A&B, COVID)  RVPGX2
Influenza A by PCR: NEGATIVE
Influenza B by PCR: NEGATIVE
Resp Syncytial Virus by PCR: NEGATIVE
SARS Coronavirus 2 by RT PCR: NEGATIVE

## 2023-05-21 LAB — COMPREHENSIVE METABOLIC PANEL
ALT: 13 U/L (ref 0–44)
AST: 20 U/L (ref 15–41)
Albumin: 4.4 g/dL (ref 3.5–5.0)
Alkaline Phosphatase: 72 U/L (ref 38–126)
Anion gap: 10 (ref 5–15)
BUN: 11 mg/dL (ref 8–23)
CO2: 25 mmol/L (ref 22–32)
Calcium: 9.2 mg/dL (ref 8.9–10.3)
Chloride: 103 mmol/L (ref 98–111)
Creatinine, Ser: 0.52 mg/dL (ref 0.44–1.00)
GFR, Estimated: >60 mL/min (ref >60)
Glucose, Bld: 128 mg/dL — ABNORMAL HIGH (ref 70–99)
Potassium: 3.4 mmol/L — ABNORMAL LOW (ref 3.5–5.1)
Sodium: 138 mmol/L (ref 135–145)
Total Bilirubin: 0.8 mg/dL (ref 0.3–1.2)
Total Protein: 8 g/dL (ref 6.5–8.1)

## 2023-05-21 LAB — TROPONIN I (HIGH SENSITIVITY)
Troponin I (High Sensitivity): 2 ng/L (ref <18)
Troponin I (High Sensitivity): 4 ng/L (ref <18)

## 2023-05-21 LAB — I-STAT CG4 LACTIC ACID, ED
Lactic Acid, Venous: 1.3 mmol/L (ref 0.5–1.9)
Lactic Acid, Venous: 1.4 mmol/L (ref 0.5–1.9)

## 2023-05-21 LAB — PROTIME-INR
INR: 1.1 (ref 0.8–1.2)
Prothrombin Time: 14.2 seconds (ref 11.4–15.2)

## 2023-05-21 LAB — BRAIN NATRIURETIC PEPTIDE: B Natriuretic Peptide: 61.4 pg/mL (ref 0.0–100.0)

## 2023-05-21 LAB — APTT: aPTT: 31 seconds (ref 24–36)

## 2023-05-21 LAB — D-DIMER, QUANTITATIVE: D-Dimer, Quant: <0.27 ug/mL-FEU (ref 0.00–0.50)

## 2023-05-21 LAB — LIPASE, BLOOD: Lipase: 36 U/L (ref 11–51)

## 2023-05-21 LAB — PROCALCITONIN: Procalcitonin: <0.1 ng/mL

## 2023-05-21 MED ORDER — ATORVASTATIN CALCIUM 40 MG PO TABS
40.0000 mg | ORAL_TABLET | Freq: Every day | ORAL | Status: DC
  Administered 2023-05-22 – 2023-05-24 (×3): 40 mg via ORAL
  Filled 2023-05-21 (×3): qty 1

## 2023-05-21 MED ORDER — ALBUTEROL SULFATE (2.5 MG/3ML) 0.083% IN NEBU
2.5000 mg | INHALATION_SOLUTION | Freq: Three times a day (TID) | RESPIRATORY_TRACT | Status: DC
  Administered 2023-05-22: 2.5 mg via RESPIRATORY_TRACT
  Filled 2023-05-21: qty 3

## 2023-05-21 MED ORDER — SODIUM CHLORIDE 0.9 % IV SOLN
2.0000 g | Freq: Two times a day (BID) | INTRAVENOUS | Status: DC
  Administered 2023-05-22 – 2023-05-24 (×5): 2 g via INTRAVENOUS
  Filled 2023-05-21 (×5): qty 12.5

## 2023-05-21 MED ORDER — PREDNISONE 10 MG PO TABS
10.0000 mg | ORAL_TABLET | Freq: Every day | ORAL | Status: DC
  Filled 2023-05-21 (×3): qty 1

## 2023-05-21 MED ORDER — ONDANSETRON HCL 4 MG PO TABS: 4.0000 mg | ORAL_TABLET | Freq: Four times a day (QID) | ORAL | Status: DC | PRN

## 2023-05-21 MED ORDER — ACETAMINOPHEN 325 MG PO TABS
650.0000 mg | ORAL_TABLET | Freq: Once | ORAL | Status: DC
  Filled 2023-05-21: qty 2

## 2023-05-21 MED ORDER — ALBUTEROL SULFATE (2.5 MG/3ML) 0.083% IN NEBU: 2.5000 mg | INHALATION_SOLUTION | Freq: Four times a day (QID) | RESPIRATORY_TRACT | Status: DC

## 2023-05-21 MED ORDER — TIOTROPIUM BROMIDE MONOHYDRATE 18 MCG IN CAPS: 18.0000 ug | ORAL_CAPSULE | Freq: Every day | RESPIRATORY_TRACT | Status: DC

## 2023-05-21 MED ORDER — SODIUM CHLORIDE 0.9 % IV SOLN
2.0000 g | Freq: Once | INTRAVENOUS | Status: AC
  Administered 2023-05-21: 2 g via INTRAVENOUS
  Filled 2023-05-21: qty 12.5

## 2023-05-21 MED ORDER — KETOROLAC TROMETHAMINE 15 MG/ML IJ SOLN
15.0000 mg | Freq: Four times a day (QID) | INTRAMUSCULAR | Status: DC | PRN
  Administered 2023-05-22 – 2023-05-23 (×3): 15 mg via INTRAVENOUS
  Filled 2023-05-21 (×3): qty 1

## 2023-05-21 MED ORDER — LACTATED RINGERS IV SOLN: INTRAVENOUS | Status: AC

## 2023-05-21 MED ORDER — VANCOMYCIN HCL IN DEXTROSE 1-5 GM/200ML-% IV SOLN: 1000.0000 mg | Freq: Two times a day (BID) | INTRAVENOUS | Status: DC

## 2023-05-21 MED ORDER — BISACODYL 5 MG PO TBEC: 5.0000 mg | DELAYED_RELEASE_TABLET | Freq: Every day | ORAL | Status: DC | PRN

## 2023-05-21 MED ORDER — TERBINAFINE HCL 250 MG PO TABS
250.0000 mg | ORAL_TABLET | Freq: Every day | ORAL | Status: DC
  Administered 2023-05-23: 250 mg via ORAL
  Filled 2023-05-21 (×3): qty 1

## 2023-05-21 MED ORDER — TRAZODONE HCL 50 MG PO TABS: 25.0000 mg | ORAL_TABLET | Freq: Every evening | ORAL | Status: DC | PRN

## 2023-05-21 MED ORDER — LACTATED RINGERS IV BOLUS (SEPSIS)
1000.0000 mL | Freq: Once | INTRAVENOUS | Status: AC
  Administered 2023-05-21: 1000 mL via INTRAVENOUS

## 2023-05-21 MED ORDER — SODIUM CHLORIDE 0.9% FLUSH
3.0000 mL | Freq: Two times a day (BID) | INTRAVENOUS | Status: DC
  Administered 2023-05-22 – 2023-05-24 (×3): 3 mL via INTRAVENOUS

## 2023-05-21 MED ORDER — ACETAMINOPHEN 325 MG PO TABS: 650.0000 mg | ORAL_TABLET | Freq: Four times a day (QID) | ORAL | Status: DC | PRN

## 2023-05-21 MED ORDER — FERROUS SULFATE 325 (65 FE) MG PO TABS
325.0000 mg | ORAL_TABLET | ORAL | Status: DC
  Administered 2023-05-22 – 2023-05-24 (×2): 325 mg via ORAL
  Filled 2023-05-21 (×2): qty 1

## 2023-05-21 MED ORDER — FLUTICASONE FUROATE-VILANTEROL 200-25 MCG/ACT IN AEPB
1.0000 | INHALATION_SPRAY | Freq: Every day | RESPIRATORY_TRACT | Status: DC
  Administered 2023-05-22 – 2023-05-23 (×2): 1 via RESPIRATORY_TRACT
  Filled 2023-05-21: qty 28

## 2023-05-21 MED ORDER — NITROGLYCERIN 0.4 MG SL SUBL: 0.4000 mg | SUBLINGUAL_TABLET | SUBLINGUAL | Status: DC | PRN

## 2023-05-21 MED ORDER — ALBUTEROL SULFATE HFA 108 (90 BASE) MCG/ACT IN AERS: 2.0000 | INHALATION_SPRAY | RESPIRATORY_TRACT | Status: DC | PRN

## 2023-05-21 MED ORDER — LACTATED RINGERS IV SOLN: INTRAVENOUS | Status: DC

## 2023-05-21 MED ORDER — ONDANSETRON HCL 4 MG/2ML IJ SOLN
4.0000 mg | Freq: Four times a day (QID) | INTRAMUSCULAR | Status: DC | PRN
  Administered 2023-05-22: 4 mg via INTRAVENOUS
  Filled 2023-05-21: qty 2

## 2023-05-21 MED ORDER — ACETAMINOPHEN 650 MG RE SUPP
650.0000 mg | Freq: Once | RECTAL | Status: AC
  Administered 2023-05-21: 650 mg via RECTAL
  Filled 2023-05-21: qty 1

## 2023-05-21 MED ORDER — VANCOMYCIN HCL IN DEXTROSE 1-5 GM/200ML-% IV SOLN
1000.0000 mg | Freq: Once | INTRAVENOUS | Status: AC
  Administered 2023-05-21: 1000 mg via INTRAVENOUS
  Filled 2023-05-21: qty 200

## 2023-05-21 MED ORDER — AZITHROMYCIN 250 MG PO TABS
250.0000 mg | ORAL_TABLET | ORAL | Status: DC
  Administered 2023-05-22 – 2023-05-24 (×2): 250 mg via ORAL
  Filled 2023-05-21 (×2): qty 1

## 2023-05-21 MED ORDER — MUPIROCIN 2 % EX OINT
1.0000 | TOPICAL_OINTMENT | Freq: Two times a day (BID) | CUTANEOUS | Status: DC
  Administered 2023-05-22 – 2023-05-23 (×5): 1 via NASAL
  Filled 2023-05-21: qty 22

## 2023-05-21 MED ORDER — LACTATED RINGERS IV BOLUS
1000.0000 mL | Freq: Once | INTRAVENOUS | Status: AC
  Administered 2023-05-21: 1000 mL via INTRAVENOUS

## 2023-05-21 MED ORDER — PANTOPRAZOLE SODIUM 20 MG PO TBEC
20.0000 mg | DELAYED_RELEASE_TABLET | Freq: Every day | ORAL | Status: DC
  Administered 2023-05-22 – 2023-05-24 (×3): 20 mg via ORAL
  Filled 2023-05-21 (×3): qty 1

## 2023-05-21 MED ORDER — LORATADINE 10 MG PO TABS
10.0000 mg | ORAL_TABLET | Freq: Every day | ORAL | Status: DC
  Administered 2023-05-22 – 2023-05-24 (×3): 10 mg via ORAL
  Filled 2023-05-21 (×3): qty 1

## 2023-05-21 MED ORDER — IOHEXOL 350 MG/ML SOLN
100.0000 mL | Freq: Once | INTRAVENOUS | Status: AC | PRN
  Administered 2023-05-21: 100 mL via INTRAVENOUS

## 2023-05-21 MED ORDER — VANCOMYCIN HCL 750 MG/150ML IV SOLN
750.0000 mg | INTRAVENOUS | Status: DC
  Filled 2023-05-21: qty 150

## 2023-05-21 MED ORDER — UMECLIDINIUM BROMIDE 62.5 MCG/ACT IN AEPB
1.0000 | INHALATION_SPRAY | Freq: Every day | RESPIRATORY_TRACT | Status: DC
  Filled 2023-05-21: qty 7

## 2023-05-21 MED ORDER — ACETAMINOPHEN 650 MG RE SUPP: 650.0000 mg | Freq: Four times a day (QID) | RECTAL | Status: DC | PRN

## 2023-05-21 MED ORDER — ENOXAPARIN SODIUM 40 MG/0.4ML IJ SOSY
40.0000 mg | PREFILLED_SYRINGE | INTRAMUSCULAR | Status: DC
  Administered 2023-05-22 – 2023-05-24 (×2): 40 mg via SUBCUTANEOUS
  Filled 2023-05-21 (×3): qty 0.4

## 2023-05-21 MED ORDER — HYDRALAZINE HCL 20 MG/ML IJ SOLN: 5.0000 mg | Freq: Three times a day (TID) | INTRAMUSCULAR | Status: DC | PRN

## 2023-05-21 MED ORDER — METRONIDAZOLE 500 MG/100ML IV SOLN
500.0000 mg | Freq: Two times a day (BID) | INTRAVENOUS | Status: DC
  Administered 2023-05-22: 500 mg via INTRAVENOUS
  Filled 2023-05-21: qty 100

## 2023-05-21 MED ORDER — MORPHINE SULFATE (PF) 2 MG/ML IV SOLN: 1.0000 mg | Freq: Four times a day (QID) | INTRAVENOUS | Status: DC | PRN

## 2023-05-21 MED ORDER — ALBUTEROL SULFATE (2.5 MG/3ML) 0.083% IN NEBU: 2.5000 mg | INHALATION_SOLUTION | RESPIRATORY_TRACT | Status: DC | PRN

## 2023-05-21 MED ORDER — METRONIDAZOLE 500 MG/100ML IV SOLN
500.0000 mg | Freq: Once | INTRAVENOUS | Status: AC
  Administered 2023-05-21: 500 mg via INTRAVENOUS
  Filled 2023-05-21: qty 100

## 2023-05-21 MED ORDER — SENNOSIDES-DOCUSATE SODIUM 8.6-50 MG PO TABS: 1.0000 | ORAL_TABLET | Freq: Every evening | ORAL | Status: DC | PRN

## 2023-05-21 MED ORDER — TIOTROPIUM BROMIDE MONOHYDRATE 18 MCG IN CAPS
18.0000 ug | ORAL_CAPSULE | Freq: Every day | RESPIRATORY_TRACT | Status: DC
  Administered 2023-05-22 – 2023-05-23 (×2): 18 ug via RESPIRATORY_TRACT

## 2023-05-21 MED ORDER — VITAMIN B-12 100 MCG PO TABS
100.0000 ug | ORAL_TABLET | Freq: Every day | ORAL | Status: DC
  Administered 2023-05-22 – 2023-05-23 (×2): 100 ug via ORAL
  Filled 2023-05-21 (×2): qty 1

## 2023-05-21 NOTE — H&P (Signed)
History and Physical   [facility] [place] [facility] Admission History and Physical [employer], D.O.    Patient Name: Margaret Henry MR#: [id number] Date of Birth: [birth year] Date of Admission: 05/21/2023  Referring MD/NP/PA: Ralph Leyden Primary Care Physician: Hoy Register, MD  Chief Complaint:  Chief Complaint  Patient presents with  . Shortness of Breath  . Cough    HPI: Margaret Henry is a 72 y.o. female with a known history of COPD chronic respiratory failure on 3 L O2 by nasal cannula, GERD, hyperlipidemia presents to the emergency department for evaluation of shortness of breath.  Patient was in a usual state of health until 1 day history of shortness of breath associated with productive cough.  The cough is more chronic.  Patient denies fevers/chills, weakness, dizziness, chest pain, shortness of breath, N/V/C/D, abdominal pain, dysuria/frequency, changes in mental status.    Otherwise there has been no change in status. Patient has been taking medication as prescribed and there has been no recent change in medication or diet.  No recent antibiotics.  There has been no recent illness, hospitalizations, travel or sick contacts.    EMS/ED Course: Patient received Maxipime, Vanco, Flagyl, albuterol, lactated Ringer's, acetaminophen. Medical admission has been requested for further management of sepsis, unclear source.  Review of Systems:  CONSTITUTIONAL: Positive fatigue.  No fever/chills, weakness, weight gain/loss, headache. EYES: No blurry or double vision. ENT: No tinnitus, postnasal drip, redness or soreness of the oropharynx. RESPIRATORY: Positive cough, dyspnea, wheeze.  No hemoptysis.  CARDIOVASCULAR: No chest pain, palpitations, syncope, orthopnea. No lower extremity edema.  GASTROINTESTINAL: No nausea, vomiting, abdominal pain, diarrhea, constipation.  No hematemesis, melena or hematochezia. GENITOURINARY: No dysuria, frequency,  hematuria. ENDOCRINE: No polyuria or nocturia. No heat or cold intolerance. HEMATOLOGY: No anemia, bruising, bleeding. INTEGUMENTARY: No rashes, ulcers, lesions. MUSCULOSKELETAL: No arthritis, gout. NEUROLOGIC: No numbness, tingling, ataxia, seizure-type activity, weakness. PSYCHIATRIC: No anxiety, depression, insomnia.   Past Medical History:  Diagnosis Date  . Allergy    hayfever  . 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 Anal intraepithelial neoplasia II (AIN II) 2017  . Anemia 04/06/22  . Aortic atherosclerosis (HCC)   . B12 deficiency   . Blood transfusion without reported diagnosis 6/23  . Bronchitis   . Cancer (HCC)    skin cancer on chest  . Cataract 5/23  . Chest pain 06/12/2016  . Cholelithiasis   . Colon polyps   . Complication of anesthesia 05/16/2021   pt states was given too much during nasal surgery 1989; difficulty getting awake  . COPD (chronic obstructive pulmonary disease) (HCC)   . Coronary artery calcification   . Diverticulitis 05/16/2021   occasional  . Diverticulosis   . Dyspnea 06/27/2016  . Emphysema of lung (HCC)   . External hemorrhoids   . GERD (gastroesophageal reflux disease)    occasional  . Hemorrhoids   . High cholesterol   . Hyperlipidemia LDL goal <70   . Iron deficiency   . Mild aortic insufficiency   . Neuropathy 04/07/2022  . Osteoporosis   . Oxygen deficiency   . Pneumonia   . Shortness of breath dyspnea    with exertion   . Thyroid goiter    bx benign  . Tobacco abuse   . Vertigo 04/21/2017    Past Surgical History:  Procedure Laterality Date  . ABDOMINAL HYSTERECTOMY    . APPENDECTOMY  1973  . BILATERAL OOPHORECTOMY  10/09/1999  for benign ovarian mass   . BIOPSY THYROID    . DENTAL SURGERY     dentures  . NASAL SEPTUM SURGERY    . PARTIAL HYSTERECTOMY  10/08/1974   for heavy menses   . RECTAL EXAM UNDER ANESTHESIA N/A 12/14/2015   Procedure: RECTAL EXAM UNDER  ANESTHESIA REMOVAL OF ANAL CANAL MASS; INTERNAL HEMORRHOID LIGATION, EXTERNAL HEMORRHOID LIGATION;  Surgeon: Karie Soda, MD;  Location: [facility];  Service: General;  Laterality: N/A;  . TUBAL LIGATION    . WISDOM TOOTH EXTRACTION       reports that she quit smoking about 4 years ago. Her smoking use included cigarettes. She started smoking about 54 years ago. She has a 75 pack-year smoking history. She has never used smokeless tobacco. She reports that she does not drink alcohol and does not use drugs.  Allergies  Allergen Reactions  . Hydrocodone Nausea And Vomiting  . Propoxyphene N-Acetaminophen Nausea And Vomiting  . Augmentin [Amoxicillin-Pot Clavulanate] Rash    Family History  Problem Relation Age of Onset  . Heart disease Mother   . Alcohol abuse Mother   . Asthma Mother   . COPD Father   . Heart disease Father   . Lung cancer Maternal Grandfather   . Cancer Maternal Grandfather   . Liver cancer Paternal Grandmother   . Cancer Paternal Grandmother   . Diabetes Paternal Aunt   . Colon cancer Neg Hx   . Colon polyps Neg Hx   . Esophageal cancer Neg Hx   . Rectal cancer Neg Hx   . Stomach cancer Neg Hx     Prior to Admission medications   Medication Sig Start Date End Date Taking? Authorizing Provider  albuterol (PROVENTIL) (2.5 MG/3ML) 0.083% nebulizer solution Take 2.5 mg by nebulization every 6 (six) hours as needed for wheezing or shortness of breath.   Yes [provider]  atorvastatin (LIPITOR) 40 MG tablet TAKE 1 TABLET EVERY DAY 12/18/22  Yes Georgeanna Lea, MD  azithromycin (ZITHROMAX) 250 MG tablet Take 1 tablet (250 mg total) by mouth daily. Patient taking differently: Take 250 mg by mouth daily. Taking every other day 12/24/22  Yes Mannam, Praveen, MD  bismuth subsalicylate (PEPTO BISMOL) 262 MG/15ML suspension Take 30 mLs by mouth every 6 (six) hours as needed for indigestion.   Yes [provider]  BREO ELLIPTA 200-25 MCG/ACT AEPB INHALE  1 PUFF EVERY DAY 12/18/22  Yes Mannam, Praveen, MD  ferrous sulfate 325 (65 FE) MG EC tablet Take 325 mg by mouth every other day.   Yes [provider]  loratadine (CLARITIN) 10 MG tablet Take 1 tablet (10 mg total) by mouth daily. 01/03/23  Yes Hoy Register, MD  mupirocin ointment (BACTROBAN) 2 % Apply 1 Application topically 2 (two) times daily. To affected nostril Patient taking differently: Place 1 Application into the nose See admin instructions. Apply to affected (inner) nostril(s) one to two times a day 05/31/22  Yes Cobb, Ruby Cola, NP  nitroGLYCERIN (NITROSTAT) 0.4 MG SL tablet Place 1 tablet (0.4 mg total) under the tongue every 5 (five) minutes as needed for chest pain. 11/28/22  Yes Georgeanna Lea, MD  OXYGEN Inhale 3 L/min into the lungs continuous.   Yes [provider]  pantoprazole (PROTONIX) 20 MG tablet Take 1 tablet (20 mg total) by mouth daily. 09/27/22  Yes Doree Albee, PA-C  predniSONE (DELTASONE) 10 MG tablet Take 1 tablet (10 mg total) by mouth daily with breakfast. 04/22/23  Yes Mannam, Praveen, MD  SPIRIVA RESPIMAT 2.5 MCG/ACT AERS INHALE 2 PUFFS EVERY DAY 05/13/23  Yes Mannam, Praveen, MD  terbinafine (LAMISIL) 250 MG tablet Take 1 tablet (250 mg total) by mouth daily. 01/03/23  Yes Hoy Register, MD  vitamin B-12 (CYANOCOBALAMIN) 100 MCG tablet Take 100 mcg by mouth daily.   Yes [provider]    Physical Exam: Vitals:   05/21/23 1517 05/21/23 1700 05/21/23 1800 05/21/23 2030  BP:  (!) 166/77 (!) 187/83 (!) 156/84  Pulse:  (!) 119 (!) 120 (!) 114  Resp:  20 14 13   Temp: (!) 100.7 F (38.2 C)   97.9 F (36.6 C)  TempSrc: Rectal   Axillary  SpO2:  99% 98% 98%  Weight:      Height:        GENERAL: 72 y.o.-year-old white female patient, well-developed, well-nourished lying in the bed in no acute distress.  Pleasant and cooperative.   HEENT: Head atraumatic, normocephalic. Pupils equal. Mucus membranes dry. NECK: Supple. No  JVD. CHEST: Normal breath sounds bilaterally. No wheezing, rales, rhonchi or crackles. No use of accessory muscles of respiration.  No reproducible chest wall tenderness.  CARDIOVASCULAR: S1, S2 normal. No murmurs, rubs, or gallops. Cap refill <2 seconds. Pulses intact distally.  ABDOMEN: Soft, nondistended, nontender. No rebound, guarding, rigidity. Normoactive bowel sounds present in all four quadrants.  EXTREMITIES: No pedal edema, cyanosis, or clubbing. No calf tenderness or Homan's sign.  NEUROLOGIC: The patient is alert and oriented x 3. Cranial nerves II through XII are grossly intact with no focal sensorimotor deficit. PSYCHIATRIC:  Normal affect, mood, thought content. SKIN: Warm, dry, and intact without obvious rash, lesion, or ulcer.    Labs on Admission:  CBC: Recent Labs  Lab 05/21/23 1616  WBC 16.0*  HGB 12.6  HCT 38.9  MCV 94.2  PLT 259   Basic Metabolic Panel: Recent Labs  Lab 05/21/23 1616  NA 138  K 3.4*  CL 103  CO2 25  GLUCOSE 128*  BUN 11  CREATININE 0.52  CALCIUM 9.2   GFR: Estimated Creatinine Clearance: 47.8 mL/min (by C-G formula based on SCr of 0.52 mg/dL). Liver Function Tests: Recent Labs  Lab 05/21/23 1616  AST 20  ALT 13  ALKPHOS 72  BILITOT 0.8  PROT 8.0  ALBUMIN 4.4   Recent Labs  Lab 05/21/23 1616  LIPASE 36   No results for input(s): "AMMONIA" in the last 168 hours. Coagulation Profile: Recent Labs  Lab 05/21/23 1616  INR 1.1   Cardiac Enzymes: No results for input(s): "CKTOTAL", "CKMB", "CKMBINDEX", "TROPONINI" in the last 168 hours. BNP (last 3 results) No results for input(s): "PROBNP" in the last 8760 hours. HbA1C: No results for input(s): "HGBA1C" in the last 72 hours. CBG: No results for input(s): "GLUCAP" in the last 168 hours. Lipid Profile: No results for input(s): "CHOL", "HDL", "LDLCALC", "TRIG", "CHOLHDL", "LDLDIRECT" in the last 72 hours. Thyroid Function Tests: No results for input(s): "TSH",  "T4TOTAL", "FREET4", "T3FREE", "THYROIDAB" in the last 72 hours. Anemia Panel: No results for input(s): "VITAMINB12", "FOLATE", "FERRITIN", "TIBC", "IRON", "RETICCTPCT" in the last 72 hours. Urine analysis:    Component Value Date/Time   COLORURINE YELLOW 05/21/2023 1749   APPEARANCEUR HAZY (A) 05/21/2023 1749   LABSPEC 1.015 05/21/2023 1749   PHURINE 8.0 05/21/2023 1749   GLUCOSEU NEGATIVE 05/21/2023 1749   HGBUR NEGATIVE 05/21/2023 1749   HGBUR trace-lysed 01/21/2009 0954   BILIRUBINUR NEGATIVE 05/21/2023 1749   BILIRUBINUR negative 07/04/2022 1625  KETONESUR 20 (A) 05/21/2023 1749   PROTEINUR NEGATIVE 05/21/2023 1749   UROBILINOGEN 0.2 07/04/2022 1625   UROBILINOGEN 0.2 01/21/2009 0954   NITRITE NEGATIVE 05/21/2023 1749   LEUKOCYTESUR SMALL (A) 05/21/2023 1749   Sepsis Labs: @LABRCNTIP (procalcitonin:4,lacticidven:4) ) Recent Results (from the past 240 hour(s))  Resp panel by RT-PCR (RSV, Flu A&B, Covid) Anterior Nasal Swab     Status: None   Collection Time: 05/21/23  7:10 PM   Specimen: Anterior Nasal Swab  Result Value Ref Range Status   SARS Coronavirus 2 by RT PCR NEGATIVE NEGATIVE Final    Comment: (NOTE) SARS-CoV-2 target nucleic acids are NOT DETECTED.  The SARS-CoV-2 RNA is generally detectable in upper respiratory specimens during the acute phase of infection. The lowest concentration of SARS-CoV-2 viral copies this assay can detect is 138 copies/mL. A negative result does not preclude SARS-Cov-2 infection and should not be used as the sole basis for treatment or other patient management decisions. A negative result may occur with  improper specimen collection/handling, submission of specimen other than nasopharyngeal swab, presence of viral mutation(s) within the areas targeted by this assay, and inadequate number of viral copies(<138 copies/mL). A negative result must be combined with clinical observations, patient history, and epidemiological information.  The expected result is Negative.  Fact Sheet for Patients:  BloggerCourse.com  Fact Sheet for Healthcare Providers:  SeriousBroker.it  This test is no t yet approved or cleared by the [place] FDA and  has been authorized for detection and/or diagnosis of SARS-CoV-2 by FDA under an Emergency Use Authorization (EUA). This EUA will remain  in effect (meaning this test can be used) for the duration of the COVID-19 declaration under Section 564(b)(1) of the Act, 21 U.S.C.section 360bbb-3(b)(1), unless the authorization is terminated  or revoked sooner.       Influenza A by PCR NEGATIVE NEGATIVE Final   Influenza B by PCR NEGATIVE NEGATIVE Final    Comment: (NOTE) The Xpert Xpress SARS-CoV-2/FLU/RSV plus assay is intended as an aid in the diagnosis of influenza from Nasopharyngeal swab specimens and should not be used as a sole basis for treatment. Nasal washings and aspirates are unacceptable for Xpert Xpress SARS-CoV-2/FLU/RSV testing.  Fact Sheet for Patients: BloggerCourse.com  Fact Sheet for Healthcare Providers: SeriousBroker.it  This test is not yet approved or cleared by the [place] FDA and has been authorized for detection and/or diagnosis of SARS-CoV-2 by FDA under an Emergency Use Authorization (EUA). This EUA will remain in effect (meaning this test can be used) for the duration of the COVID-19 declaration under Section 564(b)(1) of the Act, 21 U.S.C. section 360bbb-3(b)(1), unless the authorization is terminated or revoked.     Resp Syncytial Virus by PCR NEGATIVE NEGATIVE Final    Comment: (NOTE) Fact Sheet for Patients: BloggerCourse.com  Fact Sheet for Healthcare Providers: SeriousBroker.it  This test is not yet approved or cleared by the [place] FDA and has been authorized for detection and/or  diagnosis of SARS-CoV-2 by FDA under an Emergency Use Authorization (EUA). This EUA will remain in effect (meaning this test can be used) for the duration of the COVID-19 declaration under Section 564(b)(1) of the Act, 21 U.S.C. section 360bbb-3(b)(1), unless the authorization is terminated or revoked.  Performed at [facility] [address], [place], [place] [address]      Radiological Exams on Admission: CT Angio Chest PE W and/or Wo Contrast  Result Date: 05/21/2023 CLINICAL DATA:  Pulmonary embolus suspected with high probability. Shortness  of breath and productive cough for 1 day. Sepsis. Acute nonlocalized abdominal pain. EXAM: CT ANGIOGRAPHY CHEST CT ABDOMEN AND PELVIS WITH CONTRAST TECHNIQUE: Multidetector CT imaging of the chest was performed using the standard protocol during bolus administration of intravenous contrast. Multiplanar CT image reconstructions and MIPs were obtained to evaluate the vascular anatomy. Multidetector CT imaging of the abdomen and pelvis was performed using the standard protocol during bolus administration of intravenous contrast. RADIATION DOSE REDUCTION: This exam was performed according to the departmental dose-optimization program which includes automated exposure control, adjustment of the mA and/or kV according to patient size and/or use of iterative reconstruction technique. CONTRAST:  OMNIPAQUE IOHEXOL 350 MG/ML SOLN COMPARISON:  CT chest 05/02/2023 FINDINGS: CTA CHEST FINDINGS Cardiovascular: There is good opacification of the central and segmental pulmonary arteries. No focal filling defects. No evidence of significant pulmonary embolus. Normal heart size. No pericardial effusions. Normal caliber thoracic aorta. No aortic dissection. Calcification of the aorta and coronary arteries. Mediastinum/Nodes: Small esophageal hiatal hernia. Esophagus is decompressed. No significant lymphadenopathy in the chest. Focal calcification in  the right lobe of the thyroid gland measuring 1.5 cm diameter, unchanged since prior study. This thyroid lesion has been unchanged on prior studies dating back to 08/29/2015. Long-term stability suggests benign etiology and no additional follow-up is indicated. Lungs/Pleura: Severe diffuse emphysematous changes throughout the lungs. Bronchial wall thickening and bronchiectasis consistent with chronic bronchitic changes. Scattered fibrosis. No focal consolidation or edema. Nodule in the left lung base measuring 4 mm diameter. Series 12, image 122. 5.5 mm nodule in the left lingula, image 77. No change since prior study. This is evaluated on patient's routine screening lung CT scans. See prior reports and recommendations. Musculoskeletal: Degenerative changes in the thoracic spine. Multiple vertebral compression deformities appear old and likely indicate osteoporosis. Review of the MIP images confirms the above findings. CT ABDOMEN and PELVIS FINDINGS Hepatobiliary: Cholelithiasis. No gallbladder wall thickening or edema. Bile ducts are normal. No focal liver lesions. Pancreas: Unremarkable. No pancreatic ductal dilatation or surrounding inflammatory changes. Spleen: Scattered calcified granulomas in the spleen. Adrenals/Urinary Tract: No adrenal gland nodules. Nephrograms are homogeneous and symmetrical. Prominent extrarenal pelvis on the right. No evidence suggesting obstruction. Bladder is normal. Stomach/Bowel: Stomach is within normal limits. Appendix appears normal. No evidence of bowel wall thickening, distention, or inflammatory changes. Vascular/Lymphatic: Severe calcification of the aorta and major branch vessels. Probable moderate to severe calcific stenosis in the common femoral and superficial femoral arteries with at least moderate stenosis in the external iliac arteries bilaterally. No significant lymphadenopathy. Reproductive: Status post hysterectomy. No adnexal masses. Other: No abdominal wall hernia  or abnormality. No abdominopelvic ascites. Musculoskeletal: Multiple chronic vertebral compression deformities likely indicating osteoporosis. No acute bony abnormalities are suggested. Review of the MIP images confirms the above findings. IMPRESSION: 1. No evidence of significant pulmonary embolus. 2. Severe diffuse emphysematous changes and chronic bronchitic changes in the lungs. 3. Pulmonary nodules measuring up to 5.5 mm, unchanged since prior studies. The patient received routine chest CT screening studies. See prior reports and recommendations. 4. Multiple chronic appearing vertebral compression deformities in the thoracic and lumbar spine likely indicate osteoporosis. 5. Cholelithiasis. 6. Severe aortic atherosclerosis. Electronically Signed   By: Burman Nieves M.D.   On: 05/21/2023 18:53   CT ABDOMEN PELVIS W CONTRAST  Result Date: 05/21/2023 CLINICAL DATA:  Pulmonary embolus suspected with high probability. Shortness of breath and productive cough for 1 day. Sepsis. Acute nonlocalized abdominal pain. EXAM: CT ANGIOGRAPHY CHEST CT  ABDOMEN AND PELVIS WITH CONTRAST TECHNIQUE: Multidetector CT imaging of the chest was performed using the standard protocol during bolus administration of intravenous contrast. Multiplanar CT image reconstructions and MIPs were obtained to evaluate the vascular anatomy. Multidetector CT imaging of the abdomen and pelvis was performed using the standard protocol during bolus administration of intravenous contrast. RADIATION DOSE REDUCTION: This exam was performed according to the departmental dose-optimization program which includes automated exposure control, adjustment of the mA and/or kV according to patient size and/or use of iterative reconstruction technique. CONTRAST:  OMNIPAQUE IOHEXOL 350 MG/ML SOLN COMPARISON:  CT chest 05/02/2023 FINDINGS: CTA CHEST FINDINGS Cardiovascular: There is good opacification of the central and segmental pulmonary arteries. No focal  filling defects. No evidence of significant pulmonary embolus. Normal heart size. No pericardial effusions. Normal caliber thoracic aorta. No aortic dissection. Calcification of the aorta and coronary arteries. Mediastinum/Nodes: Small esophageal hiatal hernia. Esophagus is decompressed. No significant lymphadenopathy in the chest. Focal calcification in the right lobe of the thyroid gland measuring 1.5 cm diameter, unchanged since prior study. This thyroid lesion has been unchanged on prior studies dating back to 08/29/2015. Long-term stability suggests benign etiology and no additional follow-up is indicated. Lungs/Pleura: Severe diffuse emphysematous changes throughout the lungs. Bronchial wall thickening and bronchiectasis consistent with chronic bronchitic changes. Scattered fibrosis. No focal consolidation or edema. Nodule in the left lung base measuring 4 mm diameter. Series 12, image 122. 5.5 mm nodule in the left lingula, image 77. No change since prior study. This is evaluated on patient's routine screening lung CT scans. See prior reports and recommendations. Musculoskeletal: Degenerative changes in the thoracic spine. Multiple vertebral compression deformities appear old and likely indicate osteoporosis. Review of the MIP images confirms the above findings. CT ABDOMEN and PELVIS FINDINGS Hepatobiliary: Cholelithiasis. No gallbladder wall thickening or edema. Bile ducts are normal. No focal liver lesions. Pancreas: Unremarkable. No pancreatic ductal dilatation or surrounding inflammatory changes. Spleen: Scattered calcified granulomas in the spleen. Adrenals/Urinary Tract: No adrenal gland nodules. Nephrograms are homogeneous and symmetrical. Prominent extrarenal pelvis on the right. No evidence suggesting obstruction. Bladder is normal. Stomach/Bowel: Stomach is within normal limits. Appendix appears normal. No evidence of bowel wall thickening, distention, or inflammatory changes. Vascular/Lymphatic:  Severe calcification of the aorta and major branch vessels. Probable moderate to severe calcific stenosis in the common femoral and superficial femoral arteries with at least moderate stenosis in the external iliac arteries bilaterally. No significant lymphadenopathy. Reproductive: Status post hysterectomy. No adnexal masses. Other: No abdominal wall hernia or abnormality. No abdominopelvic ascites. Musculoskeletal: Multiple chronic vertebral compression deformities likely indicating osteoporosis. No acute bony abnormalities are suggested. Review of the MIP images confirms the above findings. IMPRESSION: 1. No evidence of significant pulmonary embolus. 2. Severe diffuse emphysematous changes and chronic bronchitic changes in the lungs. 3. Pulmonary nodules measuring up to 5.5 mm, unchanged since prior studies. The patient received routine chest CT screening studies. See prior reports and recommendations. 4. Multiple chronic appearing vertebral compression deformities in the thoracic and lumbar spine likely indicate osteoporosis. 5. Cholelithiasis. 6. Severe aortic atherosclerosis. Electronically Signed   By: Burman Nieves M.D.   On: 05/21/2023 18:53   DG Chest Port 1 View  Result Date: 05/21/2023 CLINICAL DATA:  Cough and COPD EXAM: PORTABLE CHEST 1 VIEW COMPARISON:  X-ray 04/22/2023 FINDINGS: Hyperinflation with chronic changes. The right inferior costophrenic angle is clipped off the edge of the film. Minimal left basilar scar or atelectasis. No consolidation, pneumothorax or effusion. No edema.  Normal cardiopericardial silhouette. Calcified aorta. Overlapping cardiac leads. Osteopenia IMPRESSION: Hyperinflation with chronic changes. Electronically Signed   By: Karen Kays M.D.   On: 05/21/2023 16:21    EKG: Sinus tachycardia at 130 bpm with normal axis, right atrial enlargement and nonspecific ST-T wave changes.   Assessment/Plan  This is a 72 y.o. female with a history of COPD chronic respiratory  failure on 3 L O2 by nasal cannula, GERD, hyperlipidemia  now being admitted with:  #. Sepsis secondary to unclear source, urinalysis with small leukocyte esterase but negative nitrites - Admit to inpatient with telemetry monitoring - IV antibiotics: Cefepime, vancomycin, Flagyl - IV fluid hydration - Follow up blood,urine & sputum cultures - Repeat CBC in am.  -Check respiratory viral panel  #.  Mild hypokalemia - Replace orally  #.  History of COPD with chronic respiratory failure - Continue O2, nebs, Breo ellipta, Claritin, prednisone, Spiriva, azithromycin, mupirocin  #. History of hyperlipidemia - Continue atorvastatin  #. History of GERD - Continue Protonix  Admission status: Inpatient, telemetry IV Fluids: Lactated Ringer's Diet/Nutrition: Heart healthy Consults called: None DVT Px: Lovenox, SCDs and early ambulation. Code Status: Full Code  Disposition Plan: To home in 1-2 days  All the records are reviewed and case discussed with ED provider. Management plans discussed with the patient and/or family who express understanding and agree with plan of care.  Nicolus Ose D.O. on 05/21/2023 at 8:45 PM CC: Primary care physician; Hoy Register, MD   05/21/2023, 8:45 PM

## 2023-05-21 NOTE — Plan of Care (Signed)
  Problem: Coping: Goal: Level of anxiety will decrease Outcome: Progressing   Problem: Pain Managment: Goal: General experience of comfort will improve Outcome: Progressing   Problem: Safety: Goal: Ability to remain free from injury will improve Outcome: Progressing   

## 2023-05-21 NOTE — ED Triage Notes (Signed)
Pt BIBA from home. C/o SOB and productive cough for 1x day.  Hx COPD, and on 3L at baseline  Tachy at 130 in triage.  AOx4

## 2023-05-21 NOTE — Sepsis Progress Note (Signed)
Blood cultures drawn before antibiotics given 

## 2023-05-21 NOTE — Sepsis Progress Note (Signed)
Elink monitoring for the code sepsis protocol.  

## 2023-05-21 NOTE — ED Provider Notes (Incomplete)
[place] EMERGENCY DEPARTMENT AT [facility] Provider Note   CSN: [id number] Arrival date & time: 05/21/23  1445     History {Add pertinent medical, surgical, social history, OB history to HPI:1} Chief Complaint  Patient presents with   Shortness of Breath   Cough    Margaret Henry is a 72 y.o. female here for evaluation of feeling unwell.  She has COPD with chronic hypoxic respiratory failure on 3 L as well as chronic azithromycin.  Began feeling unwell a few days ago.  Feels like her heart has been racing.  Has felt warm however has not taken her temperature.  Had some NBNB emesis last week, subsequently resolved.  She denies any abdominal pain.  No dysuria or hematuria.  Having normal bowel movement without any blood.  She denies any chest pain.  She has chronic shortness of breath with remains unchanged.  Cough productive of green sputum which she states has been going on for the last year or so which is why she is on azithromycin.  She denies any headache, neck pain.  No back pain.  No rashes or lesions.  No recent sick contacts  HPI     Home Medications Prior to Admission medications   Medication Sig Start Date End Date Taking? Authorizing Provider  alendronate (FOSAMAX) 70 MG tablet Take 1 tablet (70 mg total) by mouth every 7 (seven) days. Take with a full glass of water on an empty stomach. 01/03/23   Hoy Register, MD  atorvastatin (LIPITOR) 40 MG tablet TAKE 1 TABLET EVERY DAY 12/18/22   Georgeanna Lea, MD  azithromycin (ZITHROMAX) 250 MG tablet Take 1 tablet (250 mg total) by mouth daily. Patient taking differently: Take 250 mg by mouth daily. Taking every other day 12/24/22   Chilton Greathouse, MD  bismuth subsalicylate (PEPTO BISMOL) 262 MG/15ML suspension Take 30 mLs by mouth every 6 (six) hours as needed for indigestion.    [provider]  BREO ELLIPTA 200-25 MCG/ACT AEPB INHALE 1 PUFF EVERY DAY 12/18/22   Mannam, Colbert Coyer, MD  ferrous sulfate 325  (65 FE) MG EC tablet Take 325 mg by mouth every other day.    [provider]  fluticasone (FLONASE) 50 MCG/ACT nasal spray Place 2 sprays into both nostrils daily. 01/03/23   Hoy Register, MD  ipratropium-albuterol (DUONEB) 0.5-2.5 (3) MG/3ML SOLN INHALE THE CONTENTS OF 1 VIAL VIA NEBULIZER EVERY 6 HOURS AS NEEDED 12/18/22   Hoy Register, MD  loratadine (CLARITIN) 10 MG tablet Take 1 tablet (10 mg total) by mouth daily. 01/03/23   Hoy Register, MD  mupirocin ointment (BACTROBAN) 2 % Apply 1 Application topically 2 (two) times daily. To affected nostril Patient taking differently: Place 1 Application into the nose See admin instructions. Apply to affected (inner) nostril(s) one to two times a day 05/31/22   Cobb, Ruby Cola, NP  nitroGLYCERIN (NITROSTAT) 0.4 MG SL tablet Place 1 tablet (0.4 mg total) under the tongue every 5 (five) minutes as needed for chest pain. 11/28/22   Georgeanna Lea, MD  OXYGEN Inhale 3 L/min into the lungs continuous.    [provider]  pantoprazole (PROTONIX) 20 MG tablet Take 1 tablet (20 mg total) by mouth daily. Patient not taking: Reported on 04/22/2023 09/27/22   Doree Albee, PA-C  predniSONE (DELTASONE) 10 MG tablet Take 1 tablet (10 mg total) by mouth daily with breakfast. 04/22/23   Chilton Greathouse, MD  SPIRIVA RESPIMAT 2.5 MCG/ACT AERS INHALE 2 PUFFS  EVERY DAY 05/13/23   Mannam, Colbert Coyer, MD  terbinafine (LAMISIL) 250 MG tablet Take 1 tablet (250 mg total) by mouth daily. Patient not taking: Reported on 04/22/2023 01/03/23   Hoy Register, MD  vitamin B-12 (CYANOCOBALAMIN) 100 MCG tablet Take 100 mcg by mouth daily.    [provider]  Vitamin D, Ergocalciferol, (DRISDOL) 1.25 MG (50000 UNIT) CAPS capsule Take 50,000 Units by mouth every 7 (seven) days. Patient not taking: Reported on 04/22/2023    [provider]      Allergies    Hydrocodone, Propoxyphene n-acetaminophen, and Augmentin [amoxicillin-pot  clavulanate]    Review of Systems   Review of Systems  Constitutional:  Positive for activity change, appetite change and fatigue.  HENT: Negative.    Respiratory:  Positive for cough and shortness of breath.   Cardiovascular: Negative.   Gastrointestinal:  Positive for nausea and vomiting (resolved, occured last week). Negative for abdominal distention, abdominal pain, blood in stool, constipation, diarrhea and rectal pain.  Genitourinary: Negative.   Musculoskeletal: Negative.   Skin: Negative.   Neurological:  Positive for weakness (generalized weakness). Negative for dizziness, tremors, seizures, syncope, facial asymmetry, speech difficulty, light-headedness and numbness.  All other systems reviewed and are negative.   Physical Exam Updated Vital Signs BP (!) 171/81 (BP Location: Right Arm)   Pulse (!) 132   Temp (!) 100.7 F (38.2 C) (Rectal)   Resp (!) 26   Ht 5\' 4"  (1.626 m)   Wt 47.6 kg   SpO2 100%   BMI 18.02 kg/m  Physical Exam Vitals and nursing note reviewed.  Constitutional:      General: She is not in acute distress.    Appearance: She is well-developed. She is ill-appearing.  HENT:     Head: Normocephalic and atraumatic.     Jaw: There is normal jaw occlusion.     Nose: Nose normal.     Mouth/Throat:     Mouth: Mucous membranes are dry.     Pharynx: Oropharynx is clear. Uvula midline.     Tonsils: No tonsillar exudate or tonsillar abscesses.     Comments: Dry Mucous membranes Eyes:     Pupils: Pupils are equal, round, and reactive to light.  Neck:     Trachea: Trachea and phonation normal.     Comments: Full range of motion, no meningismus Cardiovascular:     Rate and Rhythm: Tachycardia present.     Pulses: Normal pulses.          Radial pulses are 2+ on the right side and 2+ on the left side.       Dorsalis pedis pulses are 2+ on the right side and 2+ on the left side.     Heart sounds: Normal heart sounds.  Pulmonary:     Effort: Tachypnea  present. No accessory muscle usage, prolonged expiration or respiratory distress.     Breath sounds: Wheezing and rhonchi present.     Comments: Speaks in full sentences, no respiratory distress.  Mild wheeze/rhonchi lower lobes bilaterally Abdominal:     General: Bowel sounds are normal. There is no distension.     Palpations: Abdomen is soft.     Tenderness: There is no abdominal tenderness. There is no right CVA tenderness, left CVA tenderness, guarding or rebound.     Comments: Soft, nontender, no rebound or guarding  Musculoskeletal:        General: Normal range of motion.     Cervical back: Full passive range of  motion without pain and normal range of motion.     Comments: No bony tenderness, full range of motion  Skin:    General: Skin is warm and dry.     Capillary Refill: Capillary refill takes less than 2 seconds.     Comments: No obvious rashes or lesions  Neurological:     General: No focal deficit present.     Mental Status: She is alert.     Cranial Nerves: Cranial nerves 2-12 are intact.     Sensory: Sensation is intact.     Motor: Motor function is intact.  Psychiatric:        Mood and Affect: Mood normal.     ED Results / Procedures / Treatments   Labs (all labs ordered are listed, but only abnormal results are displayed) Labs Reviewed  RESP PANEL BY RT-PCR (RSV, FLU A&B, COVID)  [id number]  CULTURE, BLOOD (ROUTINE X 2)  CULTURE, BLOOD (ROUTINE X 2)  CBC  COMPREHENSIVE METABOLIC PANEL  PROTIME-INR  APTT  BRAIN NATRIURETIC PEPTIDE  LIPASE, BLOOD  D-DIMER, QUANTITATIVE  URINALYSIS, W/ REFLEX TO CULTURE (INFECTION SUSPECTED)  I-STAT CG4 LACTIC ACID, ED  TROPONIN I (HIGH SENSITIVITY)    EKG EKG Interpretation Date/Time:  Tuesday May 21 2023 15:03:20 EDT Ventricular Rate:  130 PR Interval:  159 QRS Duration:  108 QT Interval:  297 QTC Calculation: 437 R Axis:   11  Text Interpretation: Sinus tachycardia Consider right atrial enlargement Inferior  infarct, acute (LCx) Anteroseptal infarct, old Baseline wander in lead(s) V1  Significant baseline wander Confirmed by Alvester Chou [phone]) on 05/21/2023 3:40:38 PM  Radiology No results found.  Procedures Procedures  {Document cardiac monitor, telemetry assessment procedure when appropriate:1}  Medications Ordered in ED Medications  albuterol (VENTOLIN HFA) 108 (90 Base) MCG/ACT inhaler 2 puff (has no administration in time range)  lactated ringers infusion (has no administration in time range)  ceFEPIme (MAXIPIME) 2 g in sodium chloride 0.9 % 100 mL IVPB (has no administration in time range)  metroNIDAZOLE (FLAGYL) IVPB 500 mg (has no administration in time range)  vancomycin (VANCOCIN) IVPB 1000 mg/200 mL premix (has no administration in time range)  lactated ringers bolus 1,000 mL (has no administration in time range)  acetaminophen (TYLENOL) tablet 650 mg (has no administration in time range)    ED Course/ Medical Decision Making/ A&P   72 year old chronic hypoxic respiratory failure due to COPD, chronic antibiotic use, azithromycin here for evaluation of feeling unwell.  Last week had a few episodes of NBNB emesis, subsequently resolved.  No abdominal pain.  Developed worsening of her chronic cough productive green sputum.  She has had some intermittent shortness of breath however has some chronic shortness of breath.  No pain or swelling to lower extremities.  No hemoptysis.  No recent surgery, immobilization or malignancy.  No history of PE or DVT.  Has some mild rhonchi and wheeze at bilateral lower extremities.  Feels warm to touch.  Will get rectal temperature.  Rectal temp patient found to be febrile, tachycardic, tachypneic.  Code sepsis called.  Broad-spectrum antibiotics given, 1 L of fluids, pending lactic  Labs and imaging personally viewed and interpreted:  Lactic acid 1.3    {   Click here for ABCD2, HEART and other calculatorsREFRESH Note before signing :1}                               Medical Decision Making  Amount and/or Complexity of Data Reviewed Labs: ordered. Radiology: ordered. ECG/medicine tests: ordered.  Risk OTC drugs. Prescription drug management.   ***  {Document critical care time when appropriate:1} {Document review of labs and clinical decision tools ie heart score, Chads2Vasc2 etc:1}  {Document your independent review of radiology images, and any outside records:1} {Document your discussion with family members, caretakers, and with consultants:1} {Document social determinants of health affecting pt's care:1} {Document your decision making why or why not admission, treatments were needed:1} Final Clinical Impression(s) / ED Diagnoses Final diagnoses:  None    Rx / DC Orders ED Discharge Orders     None

## 2023-05-21 NOTE — ED Notes (Signed)
ED TO INPATIENT HANDOFF REPORT  ED Nurse Name and Phone #: Margaret Henry Name/Age/Gender Margaret Henry 72 y.o. female Room/Bed: WA11/WA11  Code Status   Code Status: Full Code  Home/SNF/Other Home Patient oriented to: self, place, time, and situation Is this baseline? Yes   Triage Complete: Triage complete  Chief Complaint Sepsis, unspecified organism [facility]) [A41.9]  Triage Note Pt BIBA from home. C/o SOB and productive cough for 1x day.  Hx COPD, and on 3L at baseline  Tachy at 130 in triage.  AOx4   Allergies Allergies  Allergen Reactions   Hydrocodone Nausea And Vomiting   Propoxyphene N-Acetaminophen Nausea And Vomiting   Augmentin [Amoxicillin-Pot Clavulanate] Rash    Level of Care/Admitting Diagnosis ED Disposition     ED Disposition  Admit   Condition  --   Comment  Hospital Area: [facility] [[id number]]  Level of Care: Telemetry [5]  Admit to tele based on following criteria: Other see comments  Comments: Sepsis  May admit patient to [facility] or [facility] if equivalent level of care is available:: No  Covid Evaluation: Confirmed COVID Negative  Diagnosis: Sepsis, unspecified organism [facility]) [[id number]]  Admitting Physician: Tonye Royalty [[id number]]  Attending Physician: Janann Colonel  Certification:: I certify this patient will need inpatient services for at least 2 midnights  Expected Medical Readiness: 05/23/2023          B Medical/Surgery History 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)   Anal intraepithelial neoplasia II (AIN II) 2017   Anemia 04/06/22   Aortic atherosclerosis (HCC)    B12 deficiency    Blood transfusion without reported diagnosis 6/23   Bronchitis    Cancer (HCC)    skin cancer on chest   Cataract 5/23   Chest pain 06/12/2016   Cholelithiasis    Colon polyps    Complication  of anesthesia 05/16/2021   pt states was given too much during nasal surgery 1989; difficulty getting awake   COPD (chronic obstructive pulmonary disease) (HCC)    Coronary artery calcification    Diverticulitis 05/16/2021   occasional   Diverticulosis    Dyspnea 06/27/2016   Emphysema of lung (HCC)    External hemorrhoids    GERD (gastroesophageal reflux disease)    occasional   Hemorrhoids    High cholesterol    Hyperlipidemia LDL goal <70    Iron deficiency    Mild aortic insufficiency    Neuropathy 04/07/2022   Osteoporosis    Oxygen deficiency    Pneumonia    Shortness of breath dyspnea    with exertion    Thyroid goiter    bx benign   Tobacco abuse    Vertigo 04/21/2017   Past Surgical History:  Procedure Laterality Date   ABDOMINAL HYSTERECTOMY     APPENDECTOMY  1973   BILATERAL OOPHORECTOMY  10/09/1999   for benign ovarian mass    BIOPSY THYROID     DENTAL SURGERY     dentures   NASAL SEPTUM SURGERY     PARTIAL HYSTERECTOMY  10/08/1974   for heavy menses    RECTAL EXAM UNDER ANESTHESIA N/A 12/14/2015   Procedure: RECTAL EXAM UNDER ANESTHESIA REMOVAL OF ANAL CANAL MASS; INTERNAL HEMORRHOID LIGATION, EXTERNAL HEMORRHOID LIGATION;  Surgeon: Karie Soda, MD;  Location: [facility];  Service: General;  Laterality: N/A;   TUBAL LIGATION  WISDOM TOOTH EXTRACTION       A IV Location/Drains/Wounds Patient Lines/Drains/Airways Status     Active Line/Drains/Airways     Name Placement date Placement time Site Days   Peripheral IV 05/21/23 22 G Anterior;Left Forearm 05/21/23  1600  Forearm  less than 1   Peripheral IV 05/21/23 20 G Left Antecubital 05/21/23  1826  Antecubital  less than 1            Intake/Output Last 24 hours  Intake/Output Summary (Last 24 hours) at 05/21/2023 2157 Last data filed at 05/21/2023 1906 Gross per 24 hour  Intake 400 ml  Output --  Net 400 ml    Labs/Imaging Results for orders placed or performed during the hospital  encounter of 05/21/23 (from the past 48 hour(s))  I-Stat Lactic Acid, ED     Status: None   Collection Time: 05/21/23  3:47 PM  Result Value Ref Range   Lactic Acid, Venous 1.3 0.5 - 1.9 mmol/L  CBC     Status: Abnormal   Collection Time: 05/21/23  4:16 PM  Result Value Ref Range   WBC 16.0 (H) 4.0 - 10.5 K/uL   RBC 4.13 3.87 - 5.11 MIL/uL   Hemoglobin 12.6 12.0 - 15.0 g/dL   HCT 78.2 95.6 - 21.3 %   MCV 94.2 80.0 - 100.0 fL   MCH 30.5 26.0 - 34.0 pg   MCHC 32.4 30.0 - 36.0 g/dL   RDW 08.6 57.8 - 46.9 %   Platelets 259 150 - 400 K/uL   nRBC 0.0 0.0 - 0.2 %    Comment: Performed at [facility], [address] [place], [place] [address]  Comprehensive metabolic panel     Status: Abnormal   Collection Time: 05/21/23  4:16 PM  Result Value Ref Range   Sodium 138 135 - 145 mmol/L   Potassium 3.4 (L) 3.5 - 5.1 mmol/L   Chloride 103 98 - 111 mmol/L   CO2 25 22 - 32 mmol/L   Glucose, Bld 128 (H) 70 - 99 mg/dL    Comment: Glucose reference range applies only to samples taken after fasting for at least 8 hours.   BUN 11 8 - 23 mg/dL   Creatinine, Ser 8.41 0.44 - 1.00 mg/dL   Calcium 9.2 8.9 - 32.4 mg/dL   Total Protein 8.0 6.5 - 8.1 g/dL   Albumin 4.4 3.5 - 5.0 g/dL   AST 20 15 - 41 U/L   ALT 13 0 - 44 U/L   Alkaline Phosphatase 72 38 - 126 U/L   Total Bilirubin 0.8 0.3 - 1.2 mg/dL   GFR, Estimated >40 >10 mL/min    Comment: (NOTE) Calculated using the CKD-EPI Creatinine Equation (2021)    Anion gap 10 5 - 15    Comment: Performed at [facility], [address] [place], [place] [address]  Protime-INR     Status: None   Collection Time: 05/21/23  4:16 PM  Result Value Ref Range   Prothrombin Time 14.2 11.4 - 15.2 seconds   INR 1.1 0.8 - 1.2    Comment: (NOTE) INR goal varies based on device and disease states. Performed at [facility], [address], [place], [place] [address]   APTT     Status: None    Collection Time: 05/21/23  4:16 PM  Result Value Ref Range   aPTT 31 24 - 36 seconds    Comment: Performed at [facility], [address] [place], [place] [address]  Brain natriuretic peptide     Status: None   Collection Time: 05/21/23  4:16 PM  Result Value Ref Range   B Natriuretic Peptide 61.4 0.0 - 100.0 pg/mL    Comment: Performed at [facility], [address] [place], [place] [address]  Lipase, blood     Status: None   Collection Time: 05/21/23  4:16 PM  Result Value Ref Range   Lipase 36 11 - 51 U/L    Comment: Performed at [facility], [address], [place], [place] [address]  Troponin I (High Sensitivity)     Status: None   Collection Time: 05/21/23  4:16 PM  Result Value Ref Range   Troponin I (High Sensitivity) 2 <18 ng/L    Comment: (NOTE) Elevated high sensitivity troponin I (hsTnI) values and significant  changes across serial measurements may suggest ACS but many other  chronic and acute conditions are known to elevate hsTnI results.  Refer to the "Links" section for chest pain algorithms and additional  guidance. Performed at [facility], [address], [place], [place] [address]   D-dimer, quantitative     Status: None   Collection Time: 05/21/23  4:16 PM  Result Value Ref Range   D-Dimer, Quant <0.27 0.00 - 0.50 ug/mL-FEU    Comment: (NOTE) At the manufacturer cut-off value of 0.5 g/mL FEU, this assay has a negative predictive value of 95-100%.This assay is intended for use in conjunction with a clinical pretest probability (PTP) assessment model to exclude pulmonary embolism (PE) and deep venous thrombosis (DVT) in outpatients suspected of PE or DVT. Results should be correlated with clinical presentation. Performed at [facility], [address] [place], [place] [address]   Urinalysis, w/ Reflex to Culture (Infection Suspected) -Urine, Clean Catch      Status: Abnormal   Collection Time: 05/21/23  5:49 PM  Result Value Ref Range   Specimen Source URINE, CLEAN CATCH    Color, Urine YELLOW YELLOW   APPearance HAZY (A) CLEAR   Specific Gravity, Urine 1.015 1.005 - 1.030   pH 8.0 5.0 - 8.0   Glucose, UA NEGATIVE NEGATIVE mg/dL   Hgb urine dipstick NEGATIVE NEGATIVE   Bilirubin Urine NEGATIVE NEGATIVE   Ketones, ur 20 (A) NEGATIVE mg/dL   Protein, ur NEGATIVE NEGATIVE mg/dL   Nitrite NEGATIVE NEGATIVE   Leukocytes,Ua SMALL (A) NEGATIVE   RBC / HPF 6-10 0 - 5 RBC/hpf   WBC, UA 6-10 0 - 5 WBC/hpf    Comment:        Reflex urine culture not performed if WBC <=10, OR if Squamous epithelial cells >5. If Squamous epithelial cells >5 suggest recollection.    Bacteria, UA RARE (A) NONE SEEN   Squamous Epithelial / HPF 0-5 0 - 5 /HPF   Mucus PRESENT     Comment: Performed at [facility], [address] [place], [place] [address]  Troponin I (High Sensitivity)     Status: None   Collection Time: 05/21/23  6:17 PM  Result Value Ref Range   Troponin I (High Sensitivity) 4 <18 ng/L    Comment: (NOTE) Elevated high sensitivity troponin I (hsTnI) values and significant  changes across serial measurements may suggest ACS but many other  chronic and acute conditions are known to elevate hsTnI results.  Refer to the "Links" section for chest pain algorithms and additional  guidance. Performed at [facility], [address] [place], [place] [address]   I-Stat Lactic Acid, ED     Status: None   Collection Time: 05/21/23  6:25 PM  Result Value Ref Range   Lactic Acid, Venous 1.4 0.5 - 1.9 mmol/L  Resp panel by RT-PCR (RSV, Flu A&B, Covid) Anterior Nasal Swab     Status: None   Collection Time: 05/21/23  7:10 PM   Specimen: Anterior Nasal Swab  Result Value Ref Range   SARS Coronavirus 2 by RT PCR NEGATIVE NEGATIVE    Comment: (NOTE) SARS-CoV-2 target nucleic acids are NOT DETECTED.  The  SARS-CoV-2 RNA is generally detectable in upper respiratory specimens during the acute phase of infection. The lowest concentration of SARS-CoV-2 viral copies this assay can detect is 138 copies/mL. A negative result does not preclude SARS-Cov-2 infection and should not be used as the sole basis for treatment or other patient management decisions. A negative result may occur with  improper specimen collection/handling, submission of specimen other than nasopharyngeal swab, presence of viral mutation(s) within the areas targeted by this assay, and inadequate number of viral copies(<138 copies/mL). A negative result must be combined with clinical observations, patient history, and epidemiological information. The expected result is Negative.  Fact Sheet for Patients:  BloggerCourse.com  Fact Sheet for Healthcare Providers:  SeriousBroker.it  This test is no t yet approved or cleared by the [place] FDA and  has been authorized for detection and/or diagnosis of SARS-CoV-2 by FDA under an Emergency Use Authorization (EUA). This EUA will remain  in effect (meaning this test can be used) for the duration of the COVID-19 declaration under Section 564(b)(1) of the Act, 21 U.S.C.section 360bbb-3(b)(1), unless the authorization is terminated  or revoked sooner.       Influenza A by PCR NEGATIVE NEGATIVE   Influenza B by PCR NEGATIVE NEGATIVE    Comment: (NOTE) The Xpert Xpress SARS-CoV-2/FLU/RSV plus assay is intended as an aid in the diagnosis of influenza from Nasopharyngeal swab specimens and should not be used as a sole basis for treatment. Nasal washings and aspirates are unacceptable for Xpert Xpress SARS-CoV-2/FLU/RSV testing.  Fact Sheet for Patients: BloggerCourse.com  Fact Sheet for Healthcare Providers: SeriousBroker.it  This test is not yet approved or cleared by the  [place] FDA and has been authorized for detection and/or diagnosis of SARS-CoV-2 by FDA under an Emergency Use Authorization (EUA). This EUA will remain in effect (meaning this test can be used) for the duration of the COVID-19 declaration under Section 564(b)(1) of the Act, 21 U.S.C. section 360bbb-3(b)(1), unless the authorization is terminated or revoked.     Resp Syncytial Virus by PCR NEGATIVE NEGATIVE    Comment: (NOTE) Fact Sheet for Patients: BloggerCourse.com  Fact Sheet for Healthcare Providers: SeriousBroker.it  This test is not yet approved or cleared by the [place] FDA and has been authorized for detection and/or diagnosis of SARS-CoV-2 by FDA under an Emergency Use Authorization (EUA). This EUA will remain in effect (meaning this test can be used) for the duration of the COVID-19 declaration under Section 564(b)(1) of the Act, 21 U.S.C. section 360bbb-3(b)(1), unless the authorization is terminated or revoked.  Performed at [facility], [address], [place], [place] [address]    CT Angio Chest PE W and/or Wo Contrast  Result Date: 05/21/2023 CLINICAL DATA:  Pulmonary embolus suspected with high probability. Shortness of breath and productive cough for 1 day. Sepsis. Acute nonlocalized abdominal pain. EXAM: CT ANGIOGRAPHY  CHEST CT ABDOMEN AND PELVIS WITH CONTRAST TECHNIQUE: Multidetector CT imaging of the chest was performed using the standard protocol during bolus administration of intravenous contrast. Multiplanar CT image reconstructions and MIPs were obtained to evaluate the vascular anatomy. Multidetector CT imaging of the abdomen and pelvis was performed using the standard protocol during bolus administration of intravenous contrast. RADIATION DOSE REDUCTION: This exam was performed according to the departmental dose-optimization program which includes automated exposure control,  adjustment of the mA and/or kV according to patient size and/or use of iterative reconstruction technique. CONTRAST:  OMNIPAQUE IOHEXOL 350 MG/ML SOLN COMPARISON:  CT chest 05/02/2023 FINDINGS: CTA CHEST FINDINGS Cardiovascular: There is good opacification of the central and segmental pulmonary arteries. No focal filling defects. No evidence of significant pulmonary embolus. Normal heart size. No pericardial effusions. Normal caliber thoracic aorta. No aortic dissection. Calcification of the aorta and coronary arteries. Mediastinum/Nodes: Small esophageal hiatal hernia. Esophagus is decompressed. No significant lymphadenopathy in the chest. Focal calcification in the right lobe of the thyroid gland measuring 1.5 cm diameter, unchanged since prior study. This thyroid lesion has been unchanged on prior studies dating back to 08/29/2015. Long-term stability suggests benign etiology and no additional follow-up is indicated. Lungs/Pleura: Severe diffuse emphysematous changes throughout the lungs. Bronchial wall thickening and bronchiectasis consistent with chronic bronchitic changes. Scattered fibrosis. No focal consolidation or edema. Nodule in the left lung base measuring 4 mm diameter. Series 12, image 122. 5.5 mm nodule in the left lingula, image 77. No change since prior study. This is evaluated on patient's routine screening lung CT scans. See prior reports and recommendations. Musculoskeletal: Degenerative changes in the thoracic spine. Multiple vertebral compression deformities appear old and likely indicate osteoporosis. Review of the MIP images confirms the above findings. CT ABDOMEN and PELVIS FINDINGS Hepatobiliary: Cholelithiasis. No gallbladder wall thickening or edema. Bile ducts are normal. No focal liver lesions. Pancreas: Unremarkable. No pancreatic ductal dilatation or surrounding inflammatory changes. Spleen: Scattered calcified granulomas in the spleen. Adrenals/Urinary Tract: No adrenal gland  nodules. Nephrograms are homogeneous and symmetrical. Prominent extrarenal pelvis on the right. No evidence suggesting obstruction. Bladder is normal. Stomach/Bowel: Stomach is within normal limits. Appendix appears normal. No evidence of bowel wall thickening, distention, or inflammatory changes. Vascular/Lymphatic: Severe calcification of the aorta and major branch vessels. Probable moderate to severe calcific stenosis in the common femoral and superficial femoral arteries with at least moderate stenosis in the external iliac arteries bilaterally. No significant lymphadenopathy. Reproductive: Status post hysterectomy. No adnexal masses. Other: No abdominal wall hernia or abnormality. No abdominopelvic ascites. Musculoskeletal: Multiple chronic vertebral compression deformities likely indicating osteoporosis. No acute bony abnormalities are suggested. Review of the MIP images confirms the above findings. IMPRESSION: 1. No evidence of significant pulmonary embolus. 2. Severe diffuse emphysematous changes and chronic bronchitic changes in the lungs. 3. Pulmonary nodules measuring up to 5.5 mm, unchanged since prior studies. The patient received routine chest CT screening studies. See prior reports and recommendations. 4. Multiple chronic appearing vertebral compression deformities in the thoracic and lumbar spine likely indicate osteoporosis. 5. Cholelithiasis. 6. Severe aortic atherosclerosis. Electronically Signed   By: Burman Nieves M.D.   On: 05/21/2023 18:53   CT ABDOMEN PELVIS W CONTRAST  Result Date: 05/21/2023 CLINICAL DATA:  Pulmonary embolus suspected with high probability. Shortness of breath and productive cough for 1 day. Sepsis. Acute nonlocalized abdominal pain. EXAM: CT ANGIOGRAPHY CHEST CT ABDOMEN AND PELVIS WITH CONTRAST TECHNIQUE: Multidetector CT imaging of the chest was performed using the  standard protocol during bolus administration of intravenous contrast. Multiplanar CT image  reconstructions and MIPs were obtained to evaluate the vascular anatomy. Multidetector CT imaging of the abdomen and pelvis was performed using the standard protocol during bolus administration of intravenous contrast. RADIATION DOSE REDUCTION: This exam was performed according to the departmental dose-optimization program which includes automated exposure control, adjustment of the mA and/or kV according to patient size and/or use of iterative reconstruction technique. CONTRAST:  OMNIPAQUE IOHEXOL 350 MG/ML SOLN COMPARISON:  CT chest 05/02/2023 FINDINGS: CTA CHEST FINDINGS Cardiovascular: There is good opacification of the central and segmental pulmonary arteries. No focal filling defects. No evidence of significant pulmonary embolus. Normal heart size. No pericardial effusions. Normal caliber thoracic aorta. No aortic dissection. Calcification of the aorta and coronary arteries. Mediastinum/Nodes: Small esophageal hiatal hernia. Esophagus is decompressed. No significant lymphadenopathy in the chest. Focal calcification in the right lobe of the thyroid gland measuring 1.5 cm diameter, unchanged since prior study. This thyroid lesion has been unchanged on prior studies dating back to 08/29/2015. Long-term stability suggests benign etiology and no additional follow-up is indicated. Lungs/Pleura: Severe diffuse emphysematous changes throughout the lungs. Bronchial wall thickening and bronchiectasis consistent with chronic bronchitic changes. Scattered fibrosis. No focal consolidation or edema. Nodule in the left lung base measuring 4 mm diameter. Series 12, image 122. 5.5 mm nodule in the left lingula, image 77. No change since prior study. This is evaluated on patient's routine screening lung CT scans. See prior reports and recommendations. Musculoskeletal: Degenerative changes in the thoracic spine. Multiple vertebral compression deformities appear old and likely indicate osteoporosis. Review of the MIP images  confirms the above findings. CT ABDOMEN and PELVIS FINDINGS Hepatobiliary: Cholelithiasis. No gallbladder wall thickening or edema. Bile ducts are normal. No focal liver lesions. Pancreas: Unremarkable. No pancreatic ductal dilatation or surrounding inflammatory changes. Spleen: Scattered calcified granulomas in the spleen. Adrenals/Urinary Tract: No adrenal gland nodules. Nephrograms are homogeneous and symmetrical. Prominent extrarenal pelvis on the right. No evidence suggesting obstruction. Bladder is normal. Stomach/Bowel: Stomach is within normal limits. Appendix appears normal. No evidence of bowel wall thickening, distention, or inflammatory changes. Vascular/Lymphatic: Severe calcification of the aorta and major branch vessels. Probable moderate to severe calcific stenosis in the common femoral and superficial femoral arteries with at least moderate stenosis in the external iliac arteries bilaterally. No significant lymphadenopathy. Reproductive: Status post hysterectomy. No adnexal masses. Other: No abdominal wall hernia or abnormality. No abdominopelvic ascites. Musculoskeletal: Multiple chronic vertebral compression deformities likely indicating osteoporosis. No acute bony abnormalities are suggested. Review of the MIP images confirms the above findings. IMPRESSION: 1. No evidence of significant pulmonary embolus. 2. Severe diffuse emphysematous changes and chronic bronchitic changes in the lungs. 3. Pulmonary nodules measuring up to 5.5 mm, unchanged since prior studies. The patient received routine chest CT screening studies. See prior reports and recommendations. 4. Multiple chronic appearing vertebral compression deformities in the thoracic and lumbar spine likely indicate osteoporosis. 5. Cholelithiasis. 6. Severe aortic atherosclerosis. Electronically Signed   By: Burman Nieves M.D.   On: 05/21/2023 18:53   DG Chest Port 1 View  Result Date: 05/21/2023 CLINICAL DATA:  Cough and COPD EXAM:  PORTABLE CHEST 1 VIEW COMPARISON:  X-ray 04/22/2023 FINDINGS: Hyperinflation with chronic changes. The right inferior costophrenic angle is clipped off the edge of the film. Minimal left basilar scar or atelectasis. No consolidation, pneumothorax or effusion. No edema. Normal cardiopericardial silhouette. Calcified aorta. Overlapping cardiac leads. Osteopenia IMPRESSION: Hyperinflation with chronic changes. Electronically  Signed   By: Karen Kays M.D.   On: 05/21/2023 16:21    Pending Labs Unresulted Labs (From admission, onward)     Start     Ordered   05/21/23 1918  Procalcitonin  Once,   URGENT       References:    Procalcitonin Lower Respiratory Tract Infection AND Sepsis Procalcitonin Algorithm   05/21/23 1917   05/21/23 1519  Blood Culture (routine x 2)  (Septic presentation on arrival (screening labs, nursing and treatment orders for obvious sepsis))  BLOOD CULTURE X 2,   STAT      05/21/23 1521   Signed and Held  CBC  (enoxaparin (LOVENOX)    CrCl >/= 30 ml/min)  Once,   R       Comments: Baseline for enoxaparin therapy IF NOT ALREADY DRAWN.  Notify MD if PLT < 100 K.    Signed and Held   Signed and Held  Creatinine, serum  (enoxaparin (LOVENOX)    CrCl >/= 30 ml/min)  Once,   R       Comments: Baseline for enoxaparin therapy IF NOT ALREADY DRAWN.    Signed and Held   Signed and Held  Creatinine, serum  (enoxaparin (LOVENOX)    CrCl >/= 30 ml/min)  Weekly,   R     Comments: while on enoxaparin therapy    Signed and Held   Signed and Held  Comprehensive metabolic panel  Tomorrow morning,   R        Signed and Held   Signed and Held  CBC  Tomorrow morning,   R        Signed and Held   Signed and Held  Magnesium  Once,   R        Signed and Held   Signed and Held  Expectorated Sputum Assessment w Gram Stain, Rflx to [employer]  Once,   R        Signed and Held            Vitals/Pain Today's Vitals   05/21/23 1800 05/21/23 2030 05/21/23 2054 05/21/23 2100  BP: (!) 187/83  (!) 156/84  (!) 183/80  Pulse: (!) 120 (!) 114  (!) 112  Resp: 14 13  16   Temp:  97.9 F (36.6 C)    TempSrc:  Axillary    SpO2: 98% 98%  99%  Weight:      Height:      PainSc:   10-Worst pain ever     Isolation Precautions No active isolations  Medications Medications  albuterol (VENTOLIN HFA) 108 (90 Base) MCG/ACT inhaler 2 puff (has no administration in time range)  lactated ringers infusion (has no administration in time range)  ceFEPIme (MAXIPIME) 2 g in sodium chloride 0.9 % 100 mL IVPB (has no administration in time range)  metroNIDAZOLE (FLAGYL) IVPB 500 mg (has no administration in time range)  vancomycin (VANCOREADY) IVPB 750 mg/150 mL (has no administration in time range)  ceFEPIme (MAXIPIME) 2 g in sodium chloride 0.9 % 100 mL IVPB (0 g Intravenous Stopped 05/21/23 1704)  metroNIDAZOLE (FLAGYL) IVPB 500 mg (0 mg Intravenous Stopped 05/21/23 1906)  vancomycin (VANCOCIN) IVPB 1000 mg/200 mL premix (0 mg Intravenous Stopped 05/21/23 1821)  lactated ringers bolus 1,000 mL (0 mLs Intravenous Stopped 05/21/23 2157)  acetaminophen (TYLENOL) suppository 650 mg (650 mg Rectal Given 05/21/23 1623)  lactated ringers bolus 1,000 mL (1,000 mLs Intravenous New Bag/Given 05/21/23 2054)  iohexol (OMNIPAQUE) 350 MG/ML injection  100 mL (100 mLs Intravenous Contrast Given 05/21/23 1830)    Mobility walks with person assist     Focused Assessments N/A   R Recommendations: See Admitting Provider Note  Report given to:   Additional Notes: N/A

## 2023-05-21 NOTE — Progress Notes (Signed)
Pharmacy Antibiotic Note  Margaret Henry is a 72 y.o. female admitted on 05/21/2023 with sepsis.  Pharmacy has been consulted for vancomycin dosing.  PMH includes  COPD chronic respiratory failure on 3 L O2 by nasal cannula, GERD, and hyperlipidemia. Sepsis secondary to unclear source, urinalysis with small leukocyte esterase but negative nitrites    Plan: Vancomycin 1000 mg IV x1 then 750 mg IV q24h ( AUC 438, Scr rounded up to 0.8 mg/dl, wt 64.3 kg)  Adjust cefepime dose to cefepime 2 gr IV q12h  Metronidazole 500 mg IV q12h per MD  Height: 5\' 4"  (162.6 cm) Weight: 47.6 kg (105 lb) IBW/kg (Calculated) : 54.7  Temp (24hrs), Avg:99.1 F (37.3 C), Min:97.9 F (36.6 C), Max:100.7 F (38.2 C)  Recent Labs  Lab 05/21/23 1547 05/21/23 1616 05/21/23 1825  WBC  --  16.0*  --   CREATININE  --  0.52  --   LATICACIDVEN 1.3  --  1.4    Estimated Creatinine Clearance: 47.8 mL/min (by C-G formula based on SCr of 0.52 mg/dL).    Allergies  Allergen Reactions   Hydrocodone Nausea And Vomiting   Propoxyphene N-Acetaminophen Nausea And Vomiting   Augmentin [Amoxicillin-Pot Clavulanate] Rash    Antimicrobials this admission: 8/13 vancomycin >>  8/13 cefepime >>  8/13 metronidazole >>   Dose adjustments this admission:   Microbiology results: 8/13 BCx:  8/13 Resp panel:   Thank you for allowing pharmacy to be a part of this patient's care.   Adalberto Cole, PharmD, BCPS 05/21/2023 9:14 PM

## 2023-05-21 NOTE — Progress Notes (Signed)
A consult was received from an ED provider for vancomycin and cefepime per pharmacy dosing.  The patient's profile has been reviewed for ht/wt/allergies/indication/available labs.    A one time order has been placed for cefepime 2 g IV once + vancomycin 1000 mg IV once.    Further antibiotics/pharmacy consults should be ordered by admitting physician if indicated.                       Thank you, Cindi Carbon, PharmD 05/21/2023  3:28 PM

## 2023-05-22 DIAGNOSIS — J438 Other emphysema: Secondary | ICD-10-CM | POA: Diagnosis not present

## 2023-05-22 DIAGNOSIS — E43 Unspecified severe protein-calorie malnutrition: Secondary | ICD-10-CM

## 2023-05-22 DIAGNOSIS — J9612 Chronic respiratory failure with hypercapnia: Secondary | ICD-10-CM

## 2023-05-22 DIAGNOSIS — A419 Sepsis, unspecified organism: Secondary | ICD-10-CM | POA: Diagnosis not present

## 2023-05-22 DIAGNOSIS — J9611 Chronic respiratory failure with hypoxia: Secondary | ICD-10-CM

## 2023-05-22 DIAGNOSIS — K219 Gastro-esophageal reflux disease without esophagitis: Secondary | ICD-10-CM

## 2023-05-22 LAB — RESPIRATORY PANEL BY PCR

## 2023-05-22 MED ORDER — TRAMADOL HCL 50 MG PO TABS
50.0000 mg | ORAL_TABLET | Freq: Two times a day (BID) | ORAL | Status: DC | PRN
  Administered 2023-05-22: 50 mg via ORAL
  Filled 2023-05-22: qty 1

## 2023-05-22 MED ORDER — LIDOCAINE 5 % EX PTCH
1.0000 | MEDICATED_PATCH | CUTANEOUS | Status: DC
  Administered 2023-05-22: 1 via TRANSDERMAL
  Filled 2023-05-22 (×2): qty 1

## 2023-05-22 MED ORDER — POTASSIUM CHLORIDE CRYS ER 20 MEQ PO TBCR
20.0000 meq | EXTENDED_RELEASE_TABLET | Freq: Two times a day (BID) | ORAL | Status: AC
  Administered 2023-05-22 (×2): 20 meq via ORAL
  Filled 2023-05-22 (×2): qty 1

## 2023-05-22 MED ORDER — ALBUTEROL SULFATE (2.5 MG/3ML) 0.083% IN NEBU
2.5000 mg | INHALATION_SOLUTION | Freq: Two times a day (BID) | RESPIRATORY_TRACT | Status: DC
  Administered 2023-05-22 – 2023-05-24 (×4): 2.5 mg via RESPIRATORY_TRACT
  Filled 2023-05-22 (×5): qty 3

## 2023-05-22 MED ORDER — SALINE SPRAY 0.65 % NA SOLN: 1.0000 | NASAL | Status: DC | PRN

## 2023-05-22 MED ORDER — GUAIFENESIN ER 600 MG PO TB12: 600.0000 mg | ORAL_TABLET | Freq: Two times a day (BID) | ORAL | Status: DC | PRN

## 2023-05-22 MED ORDER — ACETAMINOPHEN 160 MG/5ML PO SOLN
650.0000 mg | Freq: Four times a day (QID) | ORAL | Status: DC | PRN
  Administered 2023-05-22: 650 mg via ORAL
  Filled 2023-05-22: qty 20.3

## 2023-05-22 NOTE — H&P (Incomplete)
History and Physical   [facility] [place] [facility] Admission History and Physical [employer], D.O.    Patient Name: Margaret Henry MR#: [id number] Date of Birth: [birth year] Date of Admission: 05/21/2023  Referring MD/NP/PA: Ralph Leyden Primary Care Physician: Hoy Register, MD  Chief Complaint:  Chief Complaint  Patient presents with  . Shortness of Breath  . Cough    HPI: Margaret Henry is a 72 y.o. female with a known history of COPD chronic respiratory failure on 3 L O2 by nasal cannula, GERD, hyperlipidemia presents to the emergency department for evaluation of shortness of breath.  Patient was in a usual state of health until 1 day history of shortness of breath associated with productive cough.  The cough is more chronic.  Patient denies fevers/chills, weakness, dizziness, chest pain, shortness of breath, N/V/C/D, abdominal pain, dysuria/frequency, changes in mental status.    Otherwise there has been no change in status. Patient has been taking medication as prescribed and there has been no recent change in medication or diet.  No recent antibiotics.  There has been no recent illness, hospitalizations, travel or sick contacts.    EMS/ED Course: Patient received Maxipime, Vanco, Flagyl, albuterol, lactated Ringer's, acetaminophen. Medical admission has been requested for further management of sepsis, unclear source.  Review of Systems:  CONSTITUTIONAL: Positive fatigue.  No fever/chills, weakness, weight gain/loss, headache. EYES: No blurry or double vision. ENT: No tinnitus, postnasal drip, redness or soreness of the oropharynx. RESPIRATORY: Positive cough, dyspnea, wheeze.  No hemoptysis.  CARDIOVASCULAR: No chest pain, palpitations, syncope, orthopnea. No lower extremity edema.  GASTROINTESTINAL: No nausea, vomiting, abdominal pain, diarrhea, constipation.  No hematemesis, melena or hematochezia. GENITOURINARY: No dysuria, frequency,  hematuria. ENDOCRINE: No polyuria or nocturia. No heat or cold intolerance. HEMATOLOGY: No anemia, bruising, bleeding. INTEGUMENTARY: No rashes, ulcers, lesions. MUSCULOSKELETAL: No arthritis, gout. NEUROLOGIC: No numbness, tingling, ataxia, seizure-type activity, weakness. PSYCHIATRIC: No anxiety, depression, insomnia.   Past Medical History:  Diagnosis Date  . Allergy    hayfever  . 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 Anal intraepithelial neoplasia II (AIN II) 2017  . Anemia 04/06/22  . Aortic atherosclerosis (HCC)   . B12 deficiency   . Blood transfusion without reported diagnosis 6/23  . Bronchitis   . Cancer (HCC)    skin cancer on chest  . Cataract 5/23  . Chest pain 06/12/2016  . Cholelithiasis   . Colon polyps   . Complication of anesthesia 05/16/2021   pt states was given too much during nasal surgery 1989; difficulty getting awake  . COPD (chronic obstructive pulmonary disease) (HCC)   . Coronary artery calcification   . Diverticulitis 05/16/2021   occasional  . Diverticulosis   . Dyspnea 06/27/2016  . Emphysema of lung (HCC)   . External hemorrhoids   . GERD (gastroesophageal reflux disease)    occasional  . Hemorrhoids   . High cholesterol   . Hyperlipidemia LDL goal <70   . Iron deficiency   . Mild aortic insufficiency   . Neuropathy 04/07/2022  . Osteoporosis   . Oxygen deficiency   . Pneumonia   . Shortness of breath dyspnea    with exertion   . Thyroid goiter    bx benign  . Tobacco abuse   . Vertigo 04/21/2017    Past Surgical History:  Procedure Laterality Date  . ABDOMINAL HYSTERECTOMY    . APPENDECTOMY  1973  . BILATERAL OOPHORECTOMY  10/09/1999  for benign ovarian mass   . BIOPSY THYROID    . DENTAL SURGERY     dentures  . NASAL SEPTUM SURGERY    . PARTIAL HYSTERECTOMY  10/08/1974   for heavy menses   . RECTAL EXAM UNDER ANESTHESIA N/A 12/14/2015   Procedure: RECTAL EXAM UNDER  ANESTHESIA REMOVAL OF ANAL CANAL MASS; INTERNAL HEMORRHOID LIGATION, EXTERNAL HEMORRHOID LIGATION;  Surgeon: Karie Soda, MD;  Location: [facility];  Service: General;  Laterality: N/A;  . TUBAL LIGATION    . WISDOM TOOTH EXTRACTION       reports that she quit smoking about 4 years ago. Her smoking use included cigarettes. She started smoking about 54 years ago. She has a 75 pack-year smoking history. She has never used smokeless tobacco. She reports that she does not drink alcohol and does not use drugs.  Allergies  Allergen Reactions  . Hydrocodone Nausea And Vomiting  . Propoxyphene N-Acetaminophen Nausea And Vomiting  . Augmentin [Amoxicillin-Pot Clavulanate] Rash    Family History  Problem Relation Age of Onset  . Heart disease Mother   . Alcohol abuse Mother   . Asthma Mother   . COPD Father   . Heart disease Father   . Lung cancer Maternal Grandfather   . Cancer Maternal Grandfather   . Liver cancer Paternal Grandmother   . Cancer Paternal Grandmother   . Diabetes Paternal Aunt   . Colon cancer Neg Hx   . Colon polyps Neg Hx   . Esophageal cancer Neg Hx   . Rectal cancer Neg Hx   . Stomach cancer Neg Hx     Prior to Admission medications   Medication Sig Start Date End Date Taking? Authorizing Provider  albuterol (PROVENTIL) (2.5 MG/3ML) 0.083% nebulizer solution Take 2.5 mg by nebulization every 6 (six) hours as needed for wheezing or shortness of breath.   Yes [provider]  atorvastatin (LIPITOR) 40 MG tablet TAKE 1 TABLET EVERY DAY 12/18/22  Yes Georgeanna Lea, MD  azithromycin (ZITHROMAX) 250 MG tablet Take 1 tablet (250 mg total) by mouth daily. Patient taking differently: Take 250 mg by mouth daily. Taking every other day 12/24/22  Yes Mannam, Praveen, MD  bismuth subsalicylate (PEPTO BISMOL) 262 MG/15ML suspension Take 30 mLs by mouth every 6 (six) hours as needed for indigestion.   Yes [provider]  BREO ELLIPTA 200-25 MCG/ACT AEPB INHALE  1 PUFF EVERY DAY 12/18/22  Yes Mannam, Praveen, MD  ferrous sulfate 325 (65 FE) MG EC tablet Take 325 mg by mouth every other day.   Yes [provider]  loratadine (CLARITIN) 10 MG tablet Take 1 tablet (10 mg total) by mouth daily. 01/03/23  Yes Hoy Register, MD  mupirocin ointment (BACTROBAN) 2 % Apply 1 Application topically 2 (two) times daily. To affected nostril Patient taking differently: Place 1 Application into the nose See admin instructions. Apply to affected (inner) nostril(s) one to two times a day 05/31/22  Yes Cobb, Ruby Cola, NP  nitroGLYCERIN (NITROSTAT) 0.4 MG SL tablet Place 1 tablet (0.4 mg total) under the tongue every 5 (five) minutes as needed for chest pain. 11/28/22  Yes Georgeanna Lea, MD  OXYGEN Inhale 3 L/min into the lungs continuous.   Yes [provider]  pantoprazole (PROTONIX) 20 MG tablet Take 1 tablet (20 mg total) by mouth daily. 09/27/22  Yes Doree Albee, PA-C  predniSONE (DELTASONE) 10 MG tablet Take 1 tablet (10 mg total) by mouth daily with breakfast. 04/22/23  Yes Mannam, Praveen, MD  SPIRIVA RESPIMAT 2.5 MCG/ACT AERS INHALE 2 PUFFS EVERY DAY 05/13/23  Yes Mannam, Praveen, MD  terbinafine (LAMISIL) 250 MG tablet Take 1 tablet (250 mg total) by mouth daily. 01/03/23  Yes Hoy Register, MD  vitamin B-12 (CYANOCOBALAMIN) 100 MCG tablet Take 100 mcg by mouth daily.   Yes [provider]    Physical Exam: Vitals:   05/21/23 1517 05/21/23 1700 05/21/23 1800 05/21/23 2030  BP:  (!) 166/77 (!) 187/83 (!) 156/84  Pulse:  (!) 119 (!) 120 (!) 114  Resp:  20 14 13   Temp: (!) 100.7 F (38.2 C)   97.9 F (36.6 C)  TempSrc: Rectal   Axillary  SpO2:  99% 98% 98%  Weight:      Height:        GENERAL: 72 y.o.-year-old white female patient, well-developed, well-nourished lying in the bed in no acute distress.  Pleasant and cooperative.   HEENT: Head atraumatic, normocephalic. Pupils equal. Mucus membranes dry. NECK: Supple. No  JVD. CHEST: Normal breath sounds bilaterally. No wheezing, rales, rhonchi or crackles. No use of accessory muscles of respiration.  No reproducible chest wall tenderness.  CARDIOVASCULAR: S1, S2 normal. No murmurs, rubs, or gallops. Cap refill <2 seconds. Pulses intact distally.  ABDOMEN: Soft, nondistended, nontender. No rebound, guarding, rigidity. Normoactive bowel sounds present in all four quadrants.  EXTREMITIES: No pedal edema, cyanosis, or clubbing. No calf tenderness or Homan's sign.  NEUROLOGIC: The patient is alert and oriented x 3. Cranial nerves II through XII are grossly intact with no focal sensorimotor deficit. PSYCHIATRIC:  Normal affect, mood, thought content. SKIN: Warm, dry, and intact without obvious rash, lesion, or ulcer.    Labs on Admission:  CBC: Recent Labs  Lab 05/21/23 1616  WBC 16.0*  HGB 12.6  HCT 38.9  MCV 94.2  PLT 259   Basic Metabolic Panel: Recent Labs  Lab 05/21/23 1616  NA 138  K 3.4*  CL 103  CO2 25  GLUCOSE 128*  BUN 11  CREATININE 0.52  CALCIUM 9.2   GFR: Estimated Creatinine Clearance: 47.8 mL/min (by C-G formula based on SCr of 0.52 mg/dL). Liver Function Tests: Recent Labs  Lab 05/21/23 1616  AST 20  ALT 13  ALKPHOS 72  BILITOT 0.8  PROT 8.0  ALBUMIN 4.4   Recent Labs  Lab 05/21/23 1616  LIPASE 36   No results for input(s): "AMMONIA" in the last 168 hours. Coagulation Profile: Recent Labs  Lab 05/21/23 1616  INR 1.1   Cardiac Enzymes: No results for input(s): "CKTOTAL", "CKMB", "CKMBINDEX", "TROPONINI" in the last 168 hours. BNP (last 3 results) No results for input(s): "PROBNP" in the last 8760 hours. HbA1C: No results for input(s): "HGBA1C" in the last 72 hours. CBG: No results for input(s): "GLUCAP" in the last 168 hours. Lipid Profile: No results for input(s): "CHOL", "HDL", "LDLCALC", "TRIG", "CHOLHDL", "LDLDIRECT" in the last 72 hours. Thyroid Function Tests: No results for input(s): "TSH",  "T4TOTAL", "FREET4", "T3FREE", "THYROIDAB" in the last 72 hours. Anemia Panel: No results for input(s): "VITAMINB12", "FOLATE", "FERRITIN", "TIBC", "IRON", "RETICCTPCT" in the last 72 hours. Urine analysis:    Component Value Date/Time   COLORURINE YELLOW 05/21/2023 1749   APPEARANCEUR HAZY (A) 05/21/2023 1749   LABSPEC 1.015 05/21/2023 1749   PHURINE 8.0 05/21/2023 1749   GLUCOSEU NEGATIVE 05/21/2023 1749   HGBUR NEGATIVE 05/21/2023 1749   HGBUR trace-lysed 01/21/2009 0954   BILIRUBINUR NEGATIVE 05/21/2023 1749   BILIRUBINUR negative 07/04/2022 1625  KETONESUR 20 (A) 05/21/2023 1749   PROTEINUR NEGATIVE 05/21/2023 1749   UROBILINOGEN 0.2 07/04/2022 1625   UROBILINOGEN 0.2 01/21/2009 0954   NITRITE NEGATIVE 05/21/2023 1749   LEUKOCYTESUR SMALL (A) 05/21/2023 1749   Sepsis Labs: @LABRCNTIP (procalcitonin:4,lacticidven:4) ) Recent Results (from the past 240 hour(s))  Resp panel by RT-PCR (RSV, Flu A&B, Covid) Anterior Nasal Swab     Status: None   Collection Time: 05/21/23  7:10 PM   Specimen: Anterior Nasal Swab  Result Value Ref Range Status   SARS Coronavirus 2 by RT PCR NEGATIVE NEGATIVE Final    Comment: (NOTE) SARS-CoV-2 target nucleic acids are NOT DETECTED.  The SARS-CoV-2 RNA is generally detectable in upper respiratory specimens during the acute phase of infection. The lowest concentration of SARS-CoV-2 viral copies this assay can detect is 138 copies/mL. A negative result does not preclude SARS-Cov-2 infection and should not be used as the sole basis for treatment or other patient management decisions. A negative result may occur with  improper specimen collection/handling, submission of specimen other than nasopharyngeal swab, presence of viral mutation(s) within the areas targeted by this assay, and inadequate number of viral copies(<138 copies/mL). A negative result must be combined with clinical observations, patient history, and epidemiological information.  The expected result is Negative.  Fact Sheet for Patients:  BloggerCourse.com  Fact Sheet for Healthcare Providers:  SeriousBroker.it  This test is no t yet approved or cleared by the [place] FDA and  has been authorized for detection and/or diagnosis of SARS-CoV-2 by FDA under an Emergency Use Authorization (EUA). This EUA will remain  in effect (meaning this test can be used) for the duration of the COVID-19 declaration under Section 564(b)(1) of the Act, 21 U.S.C.section 360bbb-3(b)(1), unless the authorization is terminated  or revoked sooner.       Influenza A by PCR NEGATIVE NEGATIVE Final   Influenza B by PCR NEGATIVE NEGATIVE Final    Comment: (NOTE) The Xpert Xpress SARS-CoV-2/FLU/RSV plus assay is intended as an aid in the diagnosis of influenza from Nasopharyngeal swab specimens and should not be used as a sole basis for treatment. Nasal washings and aspirates are unacceptable for Xpert Xpress SARS-CoV-2/FLU/RSV testing.  Fact Sheet for Patients: BloggerCourse.com  Fact Sheet for Healthcare Providers: SeriousBroker.it  This test is not yet approved or cleared by the [place] FDA and has been authorized for detection and/or diagnosis of SARS-CoV-2 by FDA under an Emergency Use Authorization (EUA). This EUA will remain in effect (meaning this test can be used) for the duration of the COVID-19 declaration under Section 564(b)(1) of the Act, 21 U.S.C. section 360bbb-3(b)(1), unless the authorization is terminated or revoked.     Resp Syncytial Virus by PCR NEGATIVE NEGATIVE Final    Comment: (NOTE) Fact Sheet for Patients: BloggerCourse.com  Fact Sheet for Healthcare Providers: SeriousBroker.it  This test is not yet approved or cleared by the [place] FDA and has been authorized for detection and/or  diagnosis of SARS-CoV-2 by FDA under an Emergency Use Authorization (EUA). This EUA will remain in effect (meaning this test can be used) for the duration of the COVID-19 declaration under Section 564(b)(1) of the Act, 21 U.S.C. section 360bbb-3(b)(1), unless the authorization is terminated or revoked.  Performed at [facility] [address], [place], [place] [address]      Radiological Exams on Admission: CT Angio Chest PE W and/or Wo Contrast  Result Date: 05/21/2023 CLINICAL DATA:  Pulmonary embolus suspected with high probability. Shortness  of breath and productive cough for 1 day. Sepsis. Acute nonlocalized abdominal pain. EXAM: CT ANGIOGRAPHY CHEST CT ABDOMEN AND PELVIS WITH CONTRAST TECHNIQUE: Multidetector CT imaging of the chest was performed using the standard protocol during bolus administration of intravenous contrast. Multiplanar CT image reconstructions and MIPs were obtained to evaluate the vascular anatomy. Multidetector CT imaging of the abdomen and pelvis was performed using the standard protocol during bolus administration of intravenous contrast. RADIATION DOSE REDUCTION: This exam was performed according to the departmental dose-optimization program which includes automated exposure control, adjustment of the mA and/or kV according to patient size and/or use of iterative reconstruction technique. CONTRAST:  OMNIPAQUE IOHEXOL 350 MG/ML SOLN COMPARISON:  CT chest 05/02/2023 FINDINGS: CTA CHEST FINDINGS Cardiovascular: There is good opacification of the central and segmental pulmonary arteries. No focal filling defects. No evidence of significant pulmonary embolus. Normal heart size. No pericardial effusions. Normal caliber thoracic aorta. No aortic dissection. Calcification of the aorta and coronary arteries. Mediastinum/Nodes: Small esophageal hiatal hernia. Esophagus is decompressed. No significant lymphadenopathy in the chest. Focal calcification in  the right lobe of the thyroid gland measuring 1.5 cm diameter, unchanged since prior study. This thyroid lesion has been unchanged on prior studies dating back to 08/29/2015. Long-term stability suggests benign etiology and no additional follow-up is indicated. Lungs/Pleura: Severe diffuse emphysematous changes throughout the lungs. Bronchial wall thickening and bronchiectasis consistent with chronic bronchitic changes. Scattered fibrosis. No focal consolidation or edema. Nodule in the left lung base measuring 4 mm diameter. Series 12, image 122. 5.5 mm nodule in the left lingula, image 77. No change since prior study. This is evaluated on patient's routine screening lung CT scans. See prior reports and recommendations. Musculoskeletal: Degenerative changes in the thoracic spine. Multiple vertebral compression deformities appear old and likely indicate osteoporosis. Review of the MIP images confirms the above findings. CT ABDOMEN and PELVIS FINDINGS Hepatobiliary: Cholelithiasis. No gallbladder wall thickening or edema. Bile ducts are normal. No focal liver lesions. Pancreas: Unremarkable. No pancreatic ductal dilatation or surrounding inflammatory changes. Spleen: Scattered calcified granulomas in the spleen. Adrenals/Urinary Tract: No adrenal gland nodules. Nephrograms are homogeneous and symmetrical. Prominent extrarenal pelvis on the right. No evidence suggesting obstruction. Bladder is normal. Stomach/Bowel: Stomach is within normal limits. Appendix appears normal. No evidence of bowel wall thickening, distention, or inflammatory changes. Vascular/Lymphatic: Severe calcification of the aorta and major branch vessels. Probable moderate to severe calcific stenosis in the common femoral and superficial femoral arteries with at least moderate stenosis in the external iliac arteries bilaterally. No significant lymphadenopathy. Reproductive: Status post hysterectomy. No adnexal masses. Other: No abdominal wall hernia  or abnormality. No abdominopelvic ascites. Musculoskeletal: Multiple chronic vertebral compression deformities likely indicating osteoporosis. No acute bony abnormalities are suggested. Review of the MIP images confirms the above findings. IMPRESSION: 1. No evidence of significant pulmonary embolus. 2. Severe diffuse emphysematous changes and chronic bronchitic changes in the lungs. 3. Pulmonary nodules measuring up to 5.5 mm, unchanged since prior studies. The patient received routine chest CT screening studies. See prior reports and recommendations. 4. Multiple chronic appearing vertebral compression deformities in the thoracic and lumbar spine likely indicate osteoporosis. 5. Cholelithiasis. 6. Severe aortic atherosclerosis. Electronically Signed   By: Burman Nieves M.D.   On: 05/21/2023 18:53   CT ABDOMEN PELVIS W CONTRAST  Result Date: 05/21/2023 CLINICAL DATA:  Pulmonary embolus suspected with high probability. Shortness of breath and productive cough for 1 day. Sepsis. Acute nonlocalized abdominal pain. EXAM: CT ANGIOGRAPHY CHEST CT  ABDOMEN AND PELVIS WITH CONTRAST TECHNIQUE: Multidetector CT imaging of the chest was performed using the standard protocol during bolus administration of intravenous contrast. Multiplanar CT image reconstructions and MIPs were obtained to evaluate the vascular anatomy. Multidetector CT imaging of the abdomen and pelvis was performed using the standard protocol during bolus administration of intravenous contrast. RADIATION DOSE REDUCTION: This exam was performed according to the departmental dose-optimization program which includes automated exposure control, adjustment of the mA and/or kV according to patient size and/or use of iterative reconstruction technique. CONTRAST:  OMNIPAQUE IOHEXOL 350 MG/ML SOLN COMPARISON:  CT chest 05/02/2023 FINDINGS: CTA CHEST FINDINGS Cardiovascular: There is good opacification of the central and segmental pulmonary arteries. No focal  filling defects. No evidence of significant pulmonary embolus. Normal heart size. No pericardial effusions. Normal caliber thoracic aorta. No aortic dissection. Calcification of the aorta and coronary arteries. Mediastinum/Nodes: Small esophageal hiatal hernia. Esophagus is decompressed. No significant lymphadenopathy in the chest. Focal calcification in the right lobe of the thyroid gland measuring 1.5 cm diameter, unchanged since prior study. This thyroid lesion has been unchanged on prior studies dating back to 08/29/2015. Long-term stability suggests benign etiology and no additional follow-up is indicated. Lungs/Pleura: Severe diffuse emphysematous changes throughout the lungs. Bronchial wall thickening and bronchiectasis consistent with chronic bronchitic changes. Scattered fibrosis. No focal consolidation or edema. Nodule in the left lung base measuring 4 mm diameter. Series 12, image 122. 5.5 mm nodule in the left lingula, image 77. No change since prior study. This is evaluated on patient's routine screening lung CT scans. See prior reports and recommendations. Musculoskeletal: Degenerative changes in the thoracic spine. Multiple vertebral compression deformities appear old and likely indicate osteoporosis. Review of the MIP images confirms the above findings. CT ABDOMEN and PELVIS FINDINGS Hepatobiliary: Cholelithiasis. No gallbladder wall thickening or edema. Bile ducts are normal. No focal liver lesions. Pancreas: Unremarkable. No pancreatic ductal dilatation or surrounding inflammatory changes. Spleen: Scattered calcified granulomas in the spleen. Adrenals/Urinary Tract: No adrenal gland nodules. Nephrograms are homogeneous and symmetrical. Prominent extrarenal pelvis on the right. No evidence suggesting obstruction. Bladder is normal. Stomach/Bowel: Stomach is within normal limits. Appendix appears normal. No evidence of bowel wall thickening, distention, or inflammatory changes. Vascular/Lymphatic:  Severe calcification of the aorta and major branch vessels. Probable moderate to severe calcific stenosis in the common femoral and superficial femoral arteries with at least moderate stenosis in the external iliac arteries bilaterally. No significant lymphadenopathy. Reproductive: Status post hysterectomy. No adnexal masses. Other: No abdominal wall hernia or abnormality. No abdominopelvic ascites. Musculoskeletal: Multiple chronic vertebral compression deformities likely indicating osteoporosis. No acute bony abnormalities are suggested. Review of the MIP images confirms the above findings. IMPRESSION: 1. No evidence of significant pulmonary embolus. 2. Severe diffuse emphysematous changes and chronic bronchitic changes in the lungs. 3. Pulmonary nodules measuring up to 5.5 mm, unchanged since prior studies. The patient received routine chest CT screening studies. See prior reports and recommendations. 4. Multiple chronic appearing vertebral compression deformities in the thoracic and lumbar spine likely indicate osteoporosis. 5. Cholelithiasis. 6. Severe aortic atherosclerosis. Electronically Signed   By: Burman Nieves M.D.   On: 05/21/2023 18:53   DG Chest Port 1 View  Result Date: 05/21/2023 CLINICAL DATA:  Cough and COPD EXAM: PORTABLE CHEST 1 VIEW COMPARISON:  X-ray 04/22/2023 FINDINGS: Hyperinflation with chronic changes. The right inferior costophrenic angle is clipped off the edge of the film. Minimal left basilar scar or atelectasis. No consolidation, pneumothorax or effusion. No edema.  Normal cardiopericardial silhouette. Calcified aorta. Overlapping cardiac leads. Osteopenia IMPRESSION: Hyperinflation with chronic changes. Electronically Signed   By: Karen Kays M.D.   On: 05/21/2023 16:21    EKG: Sinus tachycardia at 130 bpm with normal axis, right atrial enlargement and nonspecific ST-T wave changes.   Assessment/Plan  This is a 72 y.o. female with a history of COPD chronic respiratory  failure on 3 L O2 by nasal cannula, GERD, hyperlipidemia  now being admitted with:  #. Sepsis secondary to unclear source, urinalysis with small leukocyte esterase but negative nitrites - Admit to inpatient with telemetry monitoring - IV antibiotics: Cefepime, vancomycin, Flagyl - IV fluid hydration - Follow up blood,urine & sputum cultures - Repeat CBC in am.  -Check respiratory viral panel  #.  Mild hypokalemia - Replace orally  #.  History of COPD with chronic respiratory failure - Continue O2, nebs, Breo ellipta, Claritin, prednisone, Spiriva, azithromycin, mupirocin  #. History of hyperlipidemia - Continue atorvastatin  #. History of GERD - Continue Protonix  Admission status: Inpatient, telemetry IV Fluids: Lactated Ringer's Diet/Nutrition: Heart healthy Consults called: None DVT Px: Lovenox, SCDs and early ambulation. Code Status: Full Code  Disposition Plan: To home in 1-2 days  All the records are reviewed and case discussed with ED provider. Management plans discussed with the patient and/or family who express understanding and agree with plan of care.    D.O. on 05/21/2023 at 8:45 PM CC: Primary care physician; Hoy Register, MD   05/21/2023, 8:45 PM

## 2023-05-22 NOTE — Assessment & Plan Note (Signed)
Estimated body mass index is 17.99 kg/m as calculated from the following:   Height as of this encounter: 5\' 4"  (1.626 m).   Weight as of this encounter: 47.5 kg.   -Dietitian consult

## 2023-05-22 NOTE — Hospital Course (Signed)
Taken from H&P.  Margaret Henry is a 72 y.o. female with a known history of COPD chronic respiratory failure on 3 L O2 by nasal cannula, GERD, hyperlipidemia presents to the emergency department for evaluation of shortness of breath with associated productive cough, fever, chills, palpitations and vomiting.  She was febrile at 100.7, tachycardic and tachypneic on admission, meeting sepsis criteria.  Received IV fluid and broad-spectrum antibiotics.  8/14: Afebrile this morning, improving leukocytosis, COVID PCR and respiratory viral panel negative, procalcitonin negative.  Pending blood culture, added urine culture as add-on, although UA with only small leukocytosis. Discontinuing vancomycin and Flagyl.  Continue cefepime and Zithromax.  8/15: Remained hemodynamically stable, improving leukocytosis.  Blood cultures remain negative, urine culture still pending.  Patient had some urinary retention this morning, stating that it happened whenever she is in hospital, her lower abdominal discomfort was improved with in and out catheter x 1.  Patient was able to void afterwards.  She need to go to a commode to void.  Patient is being discharged on ciprofloxacin for 3 more days for concern of UTI, cultures remain pending.  Upper respiratory symptoms improving and patient remained stable on her baseline oxygen requirement.  She will continue her current medications and need to have a close follow-up with her providers for further recommendations.

## 2023-05-22 NOTE — Plan of Care (Signed)

## 2023-05-22 NOTE — Assessment & Plan Note (Signed)
Patient did not completely met the criteria for sepsis, SARS response, no obvious source of infection at this time.  COVID-19, respiratory viral panel negative.  Procalcitonin negative.  Cultures pending Might be having common cold. She was started on broad-spectrum antibiotics -Discontinue vancomycin and Flagyl -Continue with cefepime and Zithromax for now based on her risk factor with advanced emphysema -Follow-up cultures

## 2023-05-22 NOTE — Assessment & Plan Note (Signed)
-   Continue home statin °

## 2023-05-22 NOTE — Assessment & Plan Note (Signed)
History of compression fractures with osteoporosis. -Lidocaine patch -Pain management

## 2023-05-22 NOTE — Assessment & Plan Note (Signed)
Stable on baseline oxygen requirement of 3 L. -Continue with home oxygen

## 2023-05-22 NOTE — Assessment & Plan Note (Signed)
History of advanced emphysema.  No wheezing -Continue with home bronchodilator and oxygen

## 2023-05-22 NOTE — Assessment & Plan Note (Signed)
Continue PPI ?

## 2023-05-22 NOTE — Progress Notes (Signed)
  Progress Note   Patient: Margaret Henry:[id number] DOB: Aug 29, 1951 DOA: 05/21/2023     1 DOS: the patient was seen and examined on 05/22/2023   Brief hospital course: Taken from H&P.  Margaret Henry is a 72 y.o. female with a known history of COPD chronic respiratory failure on 3 L O2 by nasal cannula, GERD, hyperlipidemia presents to the emergency department for evaluation of shortness of breath with associated productive cough, fever, chills, palpitations and vomiting.  She was febrile at 100.7, tachycardic and tachypneic on admission, meeting sepsis criteria.  Received IV fluid and broad-spectrum antibiotics.  8/14: Afebrile this morning, improving leukocytosis, COVID PCR and respiratory viral panel negative, procalcitonin negative.  Pending blood culture, added urine culture as add-on, although UA with only small leukocytosis. Discontinuing vancomycin and Flagyl.  Continue cefepime and Zithromax.      Assessment and Plan: * Sepsis, unspecified organism [facility]) Patient did not completely met the criteria for sepsis, SARS response, no obvious source of infection at this time.  COVID-19, respiratory viral panel negative.  Procalcitonin negative.  Cultures pending Might be having common cold. She was started on broad-spectrum antibiotics -Discontinue vancomycin and Flagyl -Continue with cefepime and Zithromax for now based on her risk factor with advanced emphysema -Follow-up cultures  COPD (chronic obstructive pulmonary disease) (HCC) History of advanced emphysema.  No wheezing -Continue with home bronchodilator and oxygen  Chronic respiratory failure (HCC) Stable on baseline oxygen requirement of 3 L. -Continue with home oxygen  BACK PAIN, LUMBAR History of compression fractures with osteoporosis. -Lidocaine patch -Pain management  Protein calorie malnutrition (HCC) Estimated body mass index is 17.99 kg/m as calculated from the following:   Height as of this encounter: 5'  4" (1.626 m).   Weight as of this encounter: 47.5 kg.   -Dietitian consult  Hyperlipidemia -Continue home statin  GERD (gastroesophageal reflux disease) -Continue PPI   Subjective: Patient was having some stuffy nose.  Mild cough. She was also complaining of lower back pain which is chronic and asking for lidocaine patch.  Physical Exam: Vitals:   05/22/23 0823 05/22/23 0825 05/22/23 1117 05/22/23 1409  BP:   (!) 167/70 (!) 162/71  Pulse:   95 (!) 110  Resp:   14 20  Temp:   98.3 F (36.8 C) 98.5 F (36.9 C)  TempSrc:   Oral Oral  SpO2: 99% 99% 99% 99%  Weight:      Height:       General.  Frail and malnourished elderly lady, in no acute distress. Pulmonary.  Lungs clear bilaterally, normal respiratory effort. CV.  Regular rate and rhythm, no JVD, rub or murmur. Abdomen.  Soft, nontender, nondistended, BS positive. CNS.  Alert and oriented .  No focal neurologic deficit. Extremities.  No edema, no cyanosis, pulses intact and symmetrical. Psychiatry.  Judgment and insight appears normal.   Data Reviewed: Prior data reviewed  Family Communication: Discussed with patient  Disposition: Status is: Inpatient Remains inpatient appropriate because: Severity of illness  Planned Discharge Destination: Home  DVT prophylaxis.  Lovenox Time spent: 45 minutes  This record has been created using Conservation officer, historic buildings. Errors have been sought and corrected,but may not always be located. Such creation errors do not reflect on the standard of care.   Author: Arnetha Courser, MD 05/22/2023 2:25 PM  For on call review [email or web address].

## 2023-05-23 ENCOUNTER — Other Ambulatory Visit (HOSPITAL_BASED_OUTPATIENT_CLINIC_OR_DEPARTMENT_OTHER): Payer: Self-pay

## 2023-05-23 DIAGNOSIS — E43 Unspecified severe protein-calorie malnutrition: Secondary | ICD-10-CM | POA: Diagnosis not present

## 2023-05-23 DIAGNOSIS — A419 Sepsis, unspecified organism: Secondary | ICD-10-CM | POA: Diagnosis not present

## 2023-05-23 DIAGNOSIS — J9611 Chronic respiratory failure with hypoxia: Secondary | ICD-10-CM | POA: Diagnosis not present

## 2023-05-23 DIAGNOSIS — E440 Moderate protein-calorie malnutrition: Secondary | ICD-10-CM | POA: Insufficient documentation

## 2023-05-23 DIAGNOSIS — K219 Gastro-esophageal reflux disease without esophagitis: Secondary | ICD-10-CM | POA: Diagnosis not present

## 2023-05-23 LAB — CBC
HCT: 36.6 % (ref 36.0–46.0)
Hemoglobin: 11.3 g/dL — ABNORMAL LOW (ref 12.0–15.0)
MCH: 29.4 pg (ref 26.0–34.0)
MCHC: 30.9 g/dL (ref 30.0–36.0)
MCV: 95.1 fL (ref 80.0–100.0)
Platelets: 210 10*3/uL (ref 150–400)
RBC: 3.85 MIL/uL — ABNORMAL LOW (ref 3.87–5.11)
RDW: 14 % (ref 11.5–15.5)
WBC: 11.1 10*3/uL — ABNORMAL HIGH (ref 4.0–10.5)
nRBC: 0 % (ref 0.0–0.2)

## 2023-05-23 MED ORDER — BOOST PLUS PO LIQD
237.0000 mL | Freq: Two times a day (BID) | ORAL | Status: DC
  Administered 2023-05-24 (×2): 237 mL via ORAL
  Filled 2023-05-23 (×4): qty 237

## 2023-05-23 MED ORDER — VITAMIN B-12 1000 MCG PO TABS
1000.0000 ug | ORAL_TABLET | Freq: Every day | ORAL | Status: DC
  Filled 2023-05-23: qty 1

## 2023-05-23 MED ORDER — CIPROFLOXACIN HCL 500 MG PO TABS
500.0000 mg | ORAL_TABLET | Freq: Two times a day (BID) | ORAL | 0 refills | Status: DC
  Filled 2023-05-23: qty 6, 3d supply, fill #0

## 2023-05-23 MED ORDER — LIDOCAINE 5 % EX PTCH
1.0000 | MEDICATED_PATCH | CUTANEOUS | 0 refills | Status: DC
  Filled 2023-05-23: qty 30, 30d supply, fill #0

## 2023-05-23 MED ORDER — MENTHOL 3 MG MT LOZG
1.0000 | LOZENGE | OROMUCOSAL | Status: DC | PRN
  Filled 2023-05-23: qty 9

## 2023-05-23 NOTE — Discharge Summary (Signed)
Physician Discharge Summary   Patient: Margaret Henry MRN: [id number] DOB: Feb 14, 1951  Admit date:     05/21/2023  Discharge date: 05/23/23  Discharge Physician: Arnetha Courser   PCP: Hoy Register, MD   Recommendations at discharge:  Please obtain CBC and BMP in 1 week Please follow-up final urine culture results Patient does not want to continue prednisone-please reevaluate the need Follow-up with primary care provider  Discharge Diagnoses: Principal Problem:   Sepsis, unspecified organism [facility]) Active Problems:   COPD (chronic obstructive pulmonary disease) (HCC)   Chronic respiratory failure (HCC)   BACK PAIN, LUMBAR   Protein calorie malnutrition (HCC)   Hyperlipidemia   GERD (gastroesophageal reflux disease)   Malnutrition of moderate degree   Hospital Course: Taken from H&P.  Margaret Henry is a 72 y.o. female with a known history of COPD chronic respiratory failure on 3 L O2 by nasal cannula, GERD, hyperlipidemia presents to the emergency department for evaluation of shortness of breath with associated productive cough, fever, chills, palpitations and vomiting.  She was febrile at 100.7, tachycardic and tachypneic on admission, meeting sepsis criteria.  Received IV fluid and broad-spectrum antibiotics.  8/14: Afebrile this morning, improving leukocytosis, COVID PCR and respiratory viral panel negative, procalcitonin negative.  Pending blood culture, added urine culture as add-on, although UA with only small leukocytosis. Discontinuing vancomycin and Flagyl.  Continue cefepime and Zithromax.  8/15: Remained hemodynamically stable, improving leukocytosis.  Blood cultures remain negative, urine culture still pending.  Patient had some urinary retention this morning, stating that it happened whenever she is in hospital, her lower abdominal discomfort was improved with in and out catheter x 1.  Patient was able to void afterwards.  She need to go to a commode to void.  Patient  is being discharged on ciprofloxacin for 3 more days for concern of UTI, cultures remain pending.  Upper respiratory symptoms improving and patient remained stable on her baseline oxygen requirement.  She will continue her current medications and need to have a close follow-up with her providers for further recommendations.      Assessment and Plan: * Sepsis, unspecified organism [facility]) Patient did not completely met the criteria for sepsis, SARS response, no obvious source of infection at this time.  COVID-19, respiratory viral panel negative.  Procalcitonin negative.  Cultures pending Might be having common cold. She was started on broad-spectrum antibiotics -Discontinue vancomycin and Flagyl -Continue with cefepime and Zithromax for now based on her risk factor with advanced emphysema -Follow-up cultures  COPD (chronic obstructive pulmonary disease) (HCC) History of advanced emphysema.  No wheezing -Continue with home bronchodilator and oxygen  Chronic respiratory failure (HCC) Stable on baseline oxygen requirement of 3 L. -Continue with home oxygen  BACK PAIN, LUMBAR History of compression fractures with osteoporosis. -Lidocaine patch -Pain management  Protein calorie malnutrition (HCC) Estimated body mass index is 17.99 kg/m as calculated from the following:   Height as of this encounter: 5\' 4"  (1.626 m).   Weight as of this encounter: 47.5 kg.   -Dietitian consult  Hyperlipidemia -Continue home statin  GERD (gastroesophageal reflux disease) -Continue PPI         Consultants: None Procedures performed: None Disposition: Home Diet recommendation:  Discharge Diet Orders (From admission, onward)     Start     Ordered   05/23/23 0000  Diet - low sodium heart healthy        05/23/23 1340           Regular diet  DISCHARGE MEDICATION: Allergies as of 05/23/2023       Reactions   Hydrocodone Nausea And Vomiting   Propoxyphene N-acetaminophen Nausea And  Vomiting   Augmentin [amoxicillin-pot Clavulanate] Rash        Medication List     TAKE these medications    albuterol (2.5 MG/3ML) 0.083% nebulizer solution Commonly known as: PROVENTIL Take 2.5 mg by nebulization every 6 (six) hours as needed for wheezing or shortness of breath.   atorvastatin 40 MG tablet Commonly known as: LIPITOR TAKE 1 TABLET EVERY DAY   azithromycin 250 MG tablet Commonly known as: ZITHROMAX Take 1 tablet (250 mg total) by mouth daily. What changed: additional instructions   bismuth subsalicylate 262 MG/15ML suspension Commonly known as: PEPTO BISMOL Take 30 mLs by mouth every 6 (six) hours as needed for indigestion.   Breo Ellipta 200-25 MCG/ACT Aepb Generic drug: fluticasone furoate-vilanterol INHALE 1 PUFF EVERY DAY   ciprofloxacin 500 MG tablet Commonly known as: Cipro Take 1 tablet (500 mg total) by mouth 2 (two) times daily for 3 days.   ferrous sulfate 325 (65 FE) MG EC tablet Take 325 mg by mouth every other day.   lidocaine 5 % Commonly known as: LIDODERM Place 1 patch onto the skin daily. Remove & Discard patch within 12 hours or as directed by MD   loratadine 10 MG tablet Commonly known as: CLARITIN Take 1 tablet (10 mg total) by mouth daily.   mupirocin ointment 2 % Commonly known as: BACTROBAN Apply 1 Application topically 2 (two) times daily. To affected nostril What changed:  how to take this when to take this additional instructions   nitroGLYCERIN 0.4 MG SL tablet Commonly known as: NITROSTAT Place 1 tablet (0.4 mg total) under the tongue every 5 (five) minutes as needed for chest pain.   OXYGEN Inhale 3 L/min into the lungs continuous.   pantoprazole 20 MG tablet Commonly known as: PROTONIX Take 1 tablet (20 mg total) by mouth daily.   predniSONE 10 MG tablet Commonly known as: DELTASONE Take 1 tablet (10 mg total) by mouth daily with breakfast.   Spiriva Respimat 2.5 MCG/ACT Aers Generic drug: Tiotropium  Bromide Monohydrate INHALE 2 PUFFS EVERY DAY   terbinafine 250 MG tablet Commonly known as: LamISIL Take 1 tablet (250 mg total) by mouth daily.   vitamin B-12 100 MCG tablet Commonly known as: CYANOCOBALAMIN Take 100 mcg by mouth daily.        Follow-up Information     Hoy Register, MD. Schedule an appointment as soon as possible for a visit in 1 week(s).   Specialty: Family Medicine Contact information: [address] [place] [address] [phone]                Discharge Exam: Ceasar Mons Weights   05/21/23 1505 05/21/23 2200  Weight: 47.6 kg 47.5 kg   General.  Frail and malnourished lady, in no acute distress. Pulmonary.  Lungs clear bilaterally, normal respiratory effort. CV.  Regular rate and rhythm, no JVD, rub or murmur. Abdomen.  Soft, nontender, nondistended, BS positive. CNS.  Alert and oriented .  No focal neurologic deficit. Extremities.  No edema, no cyanosis, pulses intact and symmetrical. Psychiatry.  Judgment and insight appears normal.   Condition at discharge: stable  The results of significant diagnostics from this hospitalization (including imaging, microbiology, ancillary and laboratory) are listed below for reference.   Imaging Studies: CT Angio Chest PE W and/or Wo Contrast  Result Date: 05/21/2023  CLINICAL DATA:  Pulmonary embolus suspected with high probability. Shortness of breath and productive cough for 1 day. Sepsis. Acute nonlocalized abdominal pain. EXAM: CT ANGIOGRAPHY CHEST CT ABDOMEN AND PELVIS WITH CONTRAST TECHNIQUE: Multidetector CT imaging of the chest was performed using the standard protocol during bolus administration of intravenous contrast. Multiplanar CT image reconstructions and MIPs were obtained to evaluate the vascular anatomy. Multidetector CT imaging of the abdomen and pelvis was performed using the standard protocol during bolus administration of intravenous contrast. RADIATION DOSE REDUCTION:  This exam was performed according to the departmental dose-optimization program which includes automated exposure control, adjustment of the mA and/or kV according to patient size and/or use of iterative reconstruction technique. CONTRAST:  OMNIPAQUE IOHEXOL 350 MG/ML SOLN COMPARISON:  CT chest 05/02/2023 FINDINGS: CTA CHEST FINDINGS Cardiovascular: There is good opacification of the central and segmental pulmonary arteries. No focal filling defects. No evidence of significant pulmonary embolus. Normal heart size. No pericardial effusions. Normal caliber thoracic aorta. No aortic dissection. Calcification of the aorta and coronary arteries. Mediastinum/Nodes: Small esophageal hiatal hernia. Esophagus is decompressed. No significant lymphadenopathy in the chest. Focal calcification in the right lobe of the thyroid gland measuring 1.5 cm diameter, unchanged since prior study. This thyroid lesion has been unchanged on prior studies dating back to 08/29/2015. Long-term stability suggests benign etiology and no additional follow-up is indicated. Lungs/Pleura: Severe diffuse emphysematous changes throughout the lungs. Bronchial wall thickening and bronchiectasis consistent with chronic bronchitic changes. Scattered fibrosis. No focal consolidation or edema. Nodule in the left lung base measuring 4 mm diameter. Series 12, image 122. 5.5 mm nodule in the left lingula, image 77. No change since prior study. This is evaluated on patient's routine screening lung CT scans. See prior reports and recommendations. Musculoskeletal: Degenerative changes in the thoracic spine. Multiple vertebral compression deformities appear old and likely indicate osteoporosis. Review of the MIP images confirms the above findings. CT ABDOMEN and PELVIS FINDINGS Hepatobiliary: Cholelithiasis. No gallbladder wall thickening or edema. Bile ducts are normal. No focal liver lesions. Pancreas: Unremarkable. No pancreatic ductal dilatation or  surrounding inflammatory changes. Spleen: Scattered calcified granulomas in the spleen. Adrenals/Urinary Tract: No adrenal gland nodules. Nephrograms are homogeneous and symmetrical. Prominent extrarenal pelvis on the right. No evidence suggesting obstruction. Bladder is normal. Stomach/Bowel: Stomach is within normal limits. Appendix appears normal. No evidence of bowel wall thickening, distention, or inflammatory changes. Vascular/Lymphatic: Severe calcification of the aorta and major branch vessels. Probable moderate to severe calcific stenosis in the common femoral and superficial femoral arteries with at least moderate stenosis in the external iliac arteries bilaterally. No significant lymphadenopathy. Reproductive: Status post hysterectomy. No adnexal masses. Other: No abdominal wall hernia or abnormality. No abdominopelvic ascites. Musculoskeletal: Multiple chronic vertebral compression deformities likely indicating osteoporosis. No acute bony abnormalities are suggested. Review of the MIP images confirms the above findings. IMPRESSION: 1. No evidence of significant pulmonary embolus. 2. Severe diffuse emphysematous changes and chronic bronchitic changes in the lungs. 3. Pulmonary nodules measuring up to 5.5 mm, unchanged since prior studies. The patient received routine chest CT screening studies. See prior reports and recommendations. 4. Multiple chronic appearing vertebral compression deformities in the thoracic and lumbar spine likely indicate osteoporosis. 5. Cholelithiasis. 6. Severe aortic atherosclerosis. Electronically Signed   By: Burman Nieves M.D.   On: 05/21/2023 18:53   CT ABDOMEN PELVIS W CONTRAST  Result Date: 05/21/2023 CLINICAL DATA:  Pulmonary embolus suspected with high probability. Shortness of breath and productive cough for 1 day.  Sepsis. Acute nonlocalized abdominal pain. EXAM: CT ANGIOGRAPHY CHEST CT ABDOMEN AND PELVIS WITH CONTRAST TECHNIQUE: Multidetector CT imaging of the  chest was performed using the standard protocol during bolus administration of intravenous contrast. Multiplanar CT image reconstructions and MIPs were obtained to evaluate the vascular anatomy. Multidetector CT imaging of the abdomen and pelvis was performed using the standard protocol during bolus administration of intravenous contrast. RADIATION DOSE REDUCTION: This exam was performed according to the departmental dose-optimization program which includes automated exposure control, adjustment of the mA and/or kV according to patient size and/or use of iterative reconstruction technique. CONTRAST:  OMNIPAQUE IOHEXOL 350 MG/ML SOLN COMPARISON:  CT chest 05/02/2023 FINDINGS: CTA CHEST FINDINGS Cardiovascular: There is good opacification of the central and segmental pulmonary arteries. No focal filling defects. No evidence of significant pulmonary embolus. Normal heart size. No pericardial effusions. Normal caliber thoracic aorta. No aortic dissection. Calcification of the aorta and coronary arteries. Mediastinum/Nodes: Small esophageal hiatal hernia. Esophagus is decompressed. No significant lymphadenopathy in the chest. Focal calcification in the right lobe of the thyroid gland measuring 1.5 cm diameter, unchanged since prior study. This thyroid lesion has been unchanged on prior studies dating back to 08/29/2015. Long-term stability suggests benign etiology and no additional follow-up is indicated. Lungs/Pleura: Severe diffuse emphysematous changes throughout the lungs. Bronchial wall thickening and bronchiectasis consistent with chronic bronchitic changes. Scattered fibrosis. No focal consolidation or edema. Nodule in the left lung base measuring 4 mm diameter. Series 12, image 122. 5.5 mm nodule in the left lingula, image 77. No change since prior study. This is evaluated on patient's routine screening lung CT scans. See prior reports and recommendations. Musculoskeletal: Degenerative changes in the  thoracic spine. Multiple vertebral compression deformities appear old and likely indicate osteoporosis. Review of the MIP images confirms the above findings. CT ABDOMEN and PELVIS FINDINGS Hepatobiliary: Cholelithiasis. No gallbladder wall thickening or edema. Bile ducts are normal. No focal liver lesions. Pancreas: Unremarkable. No pancreatic ductal dilatation or surrounding inflammatory changes. Spleen: Scattered calcified granulomas in the spleen. Adrenals/Urinary Tract: No adrenal gland nodules. Nephrograms are homogeneous and symmetrical. Prominent extrarenal pelvis on the right. No evidence suggesting obstruction. Bladder is normal. Stomach/Bowel: Stomach is within normal limits. Appendix appears normal. No evidence of bowel wall thickening, distention, or inflammatory changes. Vascular/Lymphatic: Severe calcification of the aorta and major branch vessels. Probable moderate to severe calcific stenosis in the common femoral and superficial femoral arteries with at least moderate stenosis in the external iliac arteries bilaterally. No significant lymphadenopathy. Reproductive: Status post hysterectomy. No adnexal masses. Other: No abdominal wall hernia or abnormality. No abdominopelvic ascites. Musculoskeletal: Multiple chronic vertebral compression deformities likely indicating osteoporosis. No acute bony abnormalities are suggested. Review of the MIP images confirms the above findings. IMPRESSION: 1. No evidence of significant pulmonary embolus. 2. Severe diffuse emphysematous changes and chronic bronchitic changes in the lungs. 3. Pulmonary nodules measuring up to 5.5 mm, unchanged since prior studies. The patient received routine chest CT screening studies. See prior reports and recommendations. 4. Multiple chronic appearing vertebral compression deformities in the thoracic and lumbar spine likely indicate osteoporosis. 5. Cholelithiasis. 6. Severe aortic atherosclerosis. Electronically Signed   By: Burman Nieves M.D.   On: 05/21/2023 18:53   DG Chest Port 1 View  Result Date: 05/21/2023 CLINICAL DATA:  Cough and COPD EXAM: PORTABLE CHEST 1 VIEW COMPARISON:  X-ray 04/22/2023 FINDINGS: Hyperinflation with chronic changes. The right inferior costophrenic angle is clipped off the edge of the film. Minimal left  basilar scar or atelectasis. No consolidation, pneumothorax or effusion. No edema. Normal cardiopericardial silhouette. Calcified aorta. Overlapping cardiac leads. Osteopenia IMPRESSION: Hyperinflation with chronic changes. Electronically Signed   By: Karen Kays M.D.   On: 05/21/2023 16:21   CT CHEST LCS NODULE F/U LOW DOSE WO CONTRAST  Result Date: 05/10/2023 CLINICAL DATA:  Abnormal lung cancer screening CT, 50 pack-year smoking history. EXAM: CT CHEST WITHOUT CONTRAST FOR LUNG CANCER SCREENING NODULE FOLLOW-UP TECHNIQUE: Multidetector CT imaging of the chest was performed following the standard protocol without IV contrast. RADIATION DOSE REDUCTION: This exam was performed according to the departmental dose-optimization program which includes automated exposure control, adjustment of the mA and/or kV according to patient size and/or use of iterative reconstruction technique. COMPARISON:  10/29/2022 and 07/27/2022. FINDINGS: Cardiovascular: Atherosclerotic calcification of the aorta, aortic valve and coronary arteries. Heart size normal. No pericardial effusion. Mediastinum/Nodes: No pathologically enlarged mediastinal or axillary lymph nodes. Hilar regions are difficult to definitively evaluate without IV contrast. Esophagus is grossly unremarkable. Lungs/Pleura: Severe centrilobular emphysema. Scattered pulmonary parenchymal scarring and mucoid impaction. Pulmonary nodules measure 5.5 mm or less in size, as before. Calcified granulomas. No pleural fluid. Airway is unremarkable. Upper Abdomen: Visualized portions of the liver, adrenal glands, kidneys, spleen, pancreas, stomach and bowel are grossly  unremarkable. No upper abdominal adenopathy. Musculoskeletal: Degenerative changes in the spine. Osteopenia. Thoracolumbar compression deformities, unchanged. T9 compression fracture is subacute to chronic in age. IMPRESSION: 1. Lung-RADS 2, benign appearance or behavior. Continue annual screening with low-dose chest CT without contrast in 12 months. 2. Aortic atherosclerosis (ICD10-I70.0). Coronary artery calcification. 3.  Emphysema (ICD10-J43.9). Electronically Signed   By: Leanna Battles M.D.   On: 05/10/2023 13:55    Microbiology: Results for orders placed or performed during the hospital encounter of 05/21/23  Urine Culture (for pregnant, neutropenic or urologic patients or patients with an indwelling urinary catheter)     Status: None (Preliminary result)   Collection Time: 05/21/23 12:05 PM   Specimen: Urine, Clean Catch  Result Value Ref Range Status   Specimen Description   Final    URINE, CLEAN CATCH Performed at [facility], [address], [place], [place] [address]    Special Requests   Final    NONE Performed at [facility], [address], [place], [place] [address]    Culture   Final    CULTURE REINCUBATED FOR BETTER GROWTH Performed at [facility], [address], [place], [place] [address]    Report Status PENDING  Incomplete  Blood Culture (routine x 2)     Status: None (Preliminary result)   Collection Time: 05/21/23  4:16 PM   Specimen: BLOOD  Result Value Ref Range Status   Specimen Description   Final    BLOOD SITE NOT SPECIFIED Performed at [facility], [address] [place], [place] [address]    Special Requests   Final    BOTTLES DRAWN AEROBIC AND ANAEROBIC Blood Culture adequate volume Performed at [facility], [address], [place], [place] [address]    Culture   Final    NO GROWTH 2 DAYS Performed at [facility], [address], [place], [place]  [address]    Report Status PENDING  Incomplete  Resp panel by RT-PCR (RSV, Flu A&B, Covid) Anterior Nasal Swab     Status: None   Collection Time: 05/21/23  7:10 PM   Specimen: Anterior Nasal Swab  Result Value  Ref Range Status   SARS Coronavirus 2 by RT PCR NEGATIVE NEGATIVE Final    Comment: (NOTE) SARS-CoV-2 target nucleic acids are NOT DETECTED.  The SARS-CoV-2 RNA is generally detectable in upper respiratory specimens during the acute phase of infection. The lowest concentration of SARS-CoV-2 viral copies this assay can detect is 138 copies/mL. A negative result does not preclude SARS-Cov-2 infection and should not be used as the sole basis for treatment or other patient management decisions. A negative result may occur with  improper specimen collection/handling, submission of specimen other than nasopharyngeal swab, presence of viral mutation(s) within the areas targeted by this assay, and inadequate number of viral copies(<138 copies/mL). A negative result must be combined with clinical observations, patient history, and epidemiological information. The expected result is Negative.  Fact Sheet for Patients:  BloggerCourse.com  Fact Sheet for Healthcare Providers:  SeriousBroker.it  This test is no t yet approved or cleared by the [place] FDA and  has been authorized for detection and/or diagnosis of SARS-CoV-2 by FDA under an Emergency Use Authorization (EUA). This EUA will remain  in effect (meaning this test can be used) for the duration of the COVID-19 declaration under Section 564(b)(1) of the Act, 21 U.S.C.section 360bbb-3(b)(1), unless the authorization is terminated  or revoked sooner.       Influenza A by PCR NEGATIVE NEGATIVE Final   Influenza B by PCR NEGATIVE NEGATIVE Final    Comment: (NOTE) The Xpert Xpress SARS-CoV-2/FLU/RSV plus assay is intended as an aid in the diagnosis of influenza from  Nasopharyngeal swab specimens and should not be used as a sole basis for treatment. Nasal washings and aspirates are unacceptable for Xpert Xpress SARS-CoV-2/FLU/RSV testing.  Fact Sheet for Patients: BloggerCourse.com  Fact Sheet for Healthcare Providers: SeriousBroker.it  This test is not yet approved or cleared by the [place] FDA and has been authorized for detection and/or diagnosis of SARS-CoV-2 by FDA under an Emergency Use Authorization (EUA). This EUA will remain in effect (meaning this test can be used) for the duration of the COVID-19 declaration under Section 564(b)(1) of the Act, 21 U.S.C. section 360bbb-3(b)(1), unless the authorization is terminated or revoked.     Resp Syncytial Virus by PCR NEGATIVE NEGATIVE Final    Comment: (NOTE) Fact Sheet for Patients: BloggerCourse.com  Fact Sheet for Healthcare Providers: SeriousBroker.it  This test is not yet approved or cleared by the [place] FDA and has been authorized for detection and/or diagnosis of SARS-CoV-2 by FDA under an Emergency Use Authorization (EUA). This EUA will remain in effect (meaning this test can be used) for the duration of the COVID-19 declaration under Section 564(b)(1) of the Act, 21 U.S.C. section 360bbb-3(b)(1), unless the authorization is terminated or revoked.  Performed at [facility], [address] [place], [place] [address]   Respiratory (~20 pathogens) panel by PCR     Status: None   Collection Time: 05/22/23 12:30 AM   Specimen: Nasopharyngeal Swab; Respiratory  Result Value Ref Range Status   Adenovirus NOT DETECTED NOT DETECTED Final   Coronavirus 229E NOT DETECTED NOT DETECTED Final    Comment: (NOTE) The Coronavirus on the Respiratory Panel, DOES NOT test for the novel  Coronavirus (2019 nCoV)    Coronavirus HKU1 NOT DETECTED NOT DETECTED  Final   Coronavirus NL63 NOT DETECTED NOT DETECTED Final   Coronavirus OC43 NOT DETECTED NOT DETECTED Final   Metapneumovirus NOT DETECTED NOT DETECTED Final   Rhinovirus / Enterovirus NOT DETECTED NOT  DETECTED Final   Influenza A NOT DETECTED NOT DETECTED Final   Influenza B NOT DETECTED NOT DETECTED Final   Parainfluenza Virus 1 NOT DETECTED NOT DETECTED Final   Parainfluenza Virus 2 NOT DETECTED NOT DETECTED Final   Parainfluenza Virus 3 NOT DETECTED NOT DETECTED Final   Parainfluenza Virus 4 NOT DETECTED NOT DETECTED Final   Respiratory Syncytial Virus NOT DETECTED NOT DETECTED Final   Bordetella pertussis NOT DETECTED NOT DETECTED Final   Bordetella Parapertussis NOT DETECTED NOT DETECTED Final   Chlamydophila pneumoniae NOT DETECTED NOT DETECTED Final   Mycoplasma pneumoniae NOT DETECTED NOT DETECTED Final    Comment: Performed at [facility], [address], [place], [place] [address]  Blood Culture (routine x 2)     Status: None (Preliminary result)   Collection Time: 05/22/23  4:24 AM   Specimen: BLOOD RIGHT HAND  Result Value Ref Range Status   Specimen Description   Final    BLOOD RIGHT HAND Performed at [facility], [address] [place], [place] [address]    Special Requests   Final    BOTTLES DRAWN AEROBIC AND ANAEROBIC Blood Culture adequate volume Performed at [facility], [address], [place], [place] [address]    Culture   Final    NO GROWTH 1 DAY Performed at [facility], [address] [place], [place] [address]    Report Status PENDING  Incomplete  Expectorated Sputum Assessment w Gram Stain, Rflx to Resp Cult     Status: None   Collection Time: 05/23/23 10:00 AM   Specimen: Sputum  Result Value Ref Range Status   Specimen Description SPUTUM  Final   Special Requests NONE  Final   Sputum evaluation   Final    THIS SPECIMEN IS ACCEPTABLE FOR SPUTUM CULTURE Performed at [facility], [address], [place], [place] [address]    Report Status 05/23/2023 FINAL  Final    Labs: CBC: Recent Labs  Lab 05/21/23 1616 05/22/23 0424 05/23/23 1216  WBC 16.0* 13.1* 11.1*  HGB 12.6 10.8* 11.3*  HCT 38.9 33.7* 36.6  MCV 94.2 93.4 95.1  PLT 259 209 210   Basic Metabolic Panel: Recent Labs  Lab 05/21/23 1616 05/22/23 0424  NA 138 139  K 3.4* 3.9  CL 103 107  CO2 25 24  GLUCOSE 128* 103*  BUN 11 7*  CREATININE 0.52 0.44  CALCIUM 9.2 8.9  MG  --  2.0   Liver Function Tests: Recent Labs  Lab 05/21/23 1616 05/22/23 0424  AST 20 17  ALT 13 10  ALKPHOS 72 56  BILITOT 0.8 0.6  PROT 8.0 6.4*  ALBUMIN 4.4 3.6   CBG: No results for input(s): "GLUCAP" in the last 168 hours.  Discharge time spent: greater than 30 minutes.  This record has been created using Conservation officer, historic buildings. Errors have been sought and corrected,but may not always be located. Such creation errors do not reflect on the standard of care.   Signed: Arnetha Courser, MD [facility] 05/23/2023

## 2023-05-23 NOTE — Progress Notes (Signed)
Nutrition Follow-up  DOCUMENTATION CODES:   Non-severe (moderate) malnutrition in context of chronic illness  INTERVENTION:  - Heart Healthy diet per MD.  - Boost Plus po BID, each supplement provides 360 kcal and 14 grams of protein. - Encourage intake at all meals and of supplements. - Multivitamin with minerals daily - Monitor weight trends.   NUTRITION DIAGNOSIS:   Moderate Malnutrition related to chronic illness (COPD) as evidenced by mild fat depletion, moderate muscle depletion.  GOAL:   Patient will meet greater than or equal to 90% of their needs  MONITOR:   PO intake, Supplement acceptance, Weight trends  REASON FOR ASSESSMENT:   Consult Assessment of nutrition requirement/status  ASSESSMENT:   72 y.o. female with a known history of COPD, chronic respiratory failure, GERD, HLD who presented for evaluation of shortness of breath. Admitted for sepsis.  Patient endorses a UBW of 115# and steady slow weigh tloss over the past 6 months due to decreased appetite.  Per EMR, patient weighed at 117# in November and has since continued to trend down to current weight of 105#. However, no significant changes within that time frame.  She reports eating 2 meals a day at home, usually breakfast and lunch. Also drinks Boost High Protein every other day. Shares she has eaten very little over the past 2-3 days due to feeling poorly.  Current appetite remains decreased but she endorses being able to eat some cream of wheat and drink coffee this AM. Encouraged patient to try to consume something at all 3 meals. She is agreeable to receive Boost during admission.     Medications reviewed and include: 325mg  ferrous sulfate, vitamin B12  Labs reviewed:  -   NUTRITION - FOCUSED PHYSICAL EXAM:  Flowsheet Row Most Recent Value  Orbital Region Mild depletion  Upper Arm Region Mild depletion  Thoracic and Lumbar Region Mild depletion  Buccal Region Moderate depletion   Temple Region Mild depletion  Clavicle Bone Region Moderate depletion  Clavicle and Acromion Bone Region Moderate depletion  Scapular Bone Region Unable to assess  Dorsal Hand Mild depletion  Patellar Region Moderate depletion  Anterior Thigh Region Moderate depletion  Posterior Calf Region Moderate depletion  Edema (RD Assessment) None  Hair Reviewed  Eyes Reviewed  Mouth Reviewed  Skin Reviewed  Nails Reviewed       Diet Order:   Diet Order             Diet Heart Room service appropriate? Yes; Fluid consistency: Thin  Diet effective now                   EDUCATION NEEDS:  Education needs have been addressed  Skin:  Skin Assessment: Reviewed RN Assessment  Last BM:  8/13  Height:  Ht Readings from Last 1 Encounters:  05/21/23 5\' 4"  (1.626 m)   Weight:  Wt Readings from Last 1 Encounters:  05/21/23 47.5 kg    BMI:  Body mass index is 17.99 kg/m.  Estimated Nutritional Needs:  Kcal:  1650-1800 kcals Protein:  70-85 grams Fluid:  >/= 1.7L    Shelle Iron RD, LDN For contact information, refer to [facility]

## 2023-05-23 NOTE — Progress Notes (Signed)
Mobility Specialist - Progress Note   05/23/23 1314  Mobility  Activity Transferred to/from [facility]  Level of Assistance Standby assist, set-up cues, supervision of patient - no hands on  Assistive Device BSC  Range of Motion/Exercises Active  Activity Response Tolerated well  $Mobility charge 1 Mobility   Pt was found trying to get up to use BSC. Pt was assisted with lines and left on BSC with call bell in reach. RN notified.  Billey Chang Mobility Specialist

## 2023-05-23 NOTE — Plan of Care (Signed)

## 2023-05-24 ENCOUNTER — Other Ambulatory Visit (HOSPITAL_BASED_OUTPATIENT_CLINIC_OR_DEPARTMENT_OTHER): Payer: Self-pay

## 2023-05-24 ENCOUNTER — Telehealth: Payer: Self-pay | Admitting: Family Medicine

## 2023-05-24 DIAGNOSIS — J410 Simple chronic bronchitis: Secondary | ICD-10-CM | POA: Diagnosis not present

## 2023-05-24 DIAGNOSIS — J441 Chronic obstructive pulmonary disease with (acute) exacerbation: Principal | ICD-10-CM

## 2023-05-24 LAB — URINE CULTURE: Culture: 40000 — AB

## 2023-05-24 LAB — GLUCOSE, CAPILLARY: Glucose-Capillary: 137 mg/dL — ABNORMAL HIGH (ref 70–99)

## 2023-05-24 MED ORDER — FLEET ENEMA RE ENEM
1.0000 | ENEMA | Freq: Once | RECTAL | Status: AC
  Administered 2023-05-24: 1 via RECTAL
  Filled 2023-05-24: qty 1

## 2023-05-24 NOTE — Telephone Encounter (Signed)
Copied from CRM [id number]. Topic: General - Other >> May 24, 2023  1:21 PM Dondra Prader E wrote: Reason for CRM: Pt is currently in the hospital and has been told she may need a catheter. She wants to speak to Dr. Alvis Lemmings Best contact: [phone]

## 2023-05-24 NOTE — Discharge Summary (Signed)
Physician Discharge Summary  Margaret Henry ZOX:[id number] DOB: 02/04/1951 DOA: 05/21/2023  PCP: Hoy Register, MD  Admit date: 05/21/2023 Discharge date: 05/24/2023  Admitted From: Home Disposition: Home  Recommendations for Outpatient Follow-up:  Follow up with PCP in 1-2 weeks Call urology to schedule follow-up.  Discharge Condition: Stable CODE STATUS: Full code Diet recommendation: Regular diet, supplements  Discharge summary:  72 year old with history of COPD, chronic hypoxemic failure on 3 L oxygen at home, GERD, hyperlipidemia presented to the emergency room with shortness of breath, productive cough, fever chills palpitations and vomiting for few days.  In the emergency room she had temperature 100.7, tachycardic and tachypneic on admission.  She received IV fluids and broad-spectrum antibiotics.  COVID PCR and respiratory virus panel was negative.  Procalcitonin was negative.  Blood cultures were negative.  She was treated as a COPD exacerbation, did good clinical recovery.  She completed 4 days of antibiotics.  Patient is on maintenance long-term azithromycin every other day.  She is optimized on COPD treatment.  Able to go home today.  No indication for further antibiotics.  Acute urinary retention, developed urinary retention that did not respond to conservative management, bowel regimen.  She had straight cath x 4, more than 1 L removed at first straight cath.  Unsuccessful voiding trial since last 24 hours.  Does have history of pelvic floor laxity and urinary retention during previous hospitalization. Going home with Foley catheter.  She has previously seen urology at [facility] urology associate.  She will call them to schedule voiding trial.  Remains stable to discharge home.   Discharge Diagnoses:  Principal Problem:   Sepsis, unspecified organism (HCC) Active Problems:   COPD (chronic obstructive pulmonary disease) (HCC)   Chronic respiratory failure (HCC)   BACK  PAIN, LUMBAR   Protein calorie malnutrition (HCC)   Hyperlipidemia   GERD (gastroesophageal reflux disease)   Malnutrition of moderate degree    Discharge Instructions  Discharge Instructions     Diet - low sodium heart healthy   Complete by: As directed    Discharge instructions   Complete by: As directed    It was pleasure taking care of you. You have been given 3 more days of antibiotics, please take it as directed. Keep yourself well-hydrated and supplement your diet with Ensure or boost. Continue taking rest of your home medications and inhalers Continue using your oxygen as she was doing it before Follow-up with your doctors closely for further recommendations   Increase activity slowly   Complete by: As directed       Allergies as of 05/24/2023       Reactions   Hydrocodone Nausea And Vomiting   Propoxyphene N-acetaminophen Nausea And Vomiting   Augmentin [amoxicillin-pot Clavulanate] Rash        Medication List     TAKE these medications    albuterol (2.5 MG/3ML) 0.083% nebulizer solution Commonly known as: PROVENTIL Take 2.5 mg by nebulization every 6 (six) hours as needed for wheezing or shortness of breath.   atorvastatin 40 MG tablet Commonly known as: LIPITOR TAKE 1 TABLET EVERY DAY   azithromycin 250 MG tablet Commonly known as: ZITHROMAX Take 1 tablet (250 mg total) by mouth daily. What changed: additional instructions   bismuth subsalicylate 262 MG/15ML suspension Commonly known as: PEPTO BISMOL Take 30 mLs by mouth every 6 (six) hours as needed for indigestion.   Breo Ellipta 200-25 MCG/ACT Aepb Generic drug: fluticasone furoate-vilanterol INHALE 1 PUFF EVERY DAY   ferrous  sulfate 325 (65 FE) MG EC tablet Take 325 mg by mouth every other day.   lidocaine 5 % Commonly known as: LIDODERM Place 1 patch onto the skin daily. Remove & Discard patch within 12 hours or as directed by MD   loratadine 10 MG tablet Commonly known as:  CLARITIN Take 1 tablet (10 mg total) by mouth daily.   mupirocin ointment 2 % Commonly known as: BACTROBAN Apply 1 Application topically 2 (two) times daily. To affected nostril What changed:  how to take this when to take this additional instructions   nitroGLYCERIN 0.4 MG SL tablet Commonly known as: NITROSTAT Place 1 tablet (0.4 mg total) under the tongue every 5 (five) minutes as needed for chest pain.   OXYGEN Inhale 3 L/min into the lungs continuous.   pantoprazole 20 MG tablet Commonly known as: PROTONIX Take 1 tablet (20 mg total) by mouth daily.   predniSONE 10 MG tablet Commonly known as: DELTASONE Take 1 tablet (10 mg total) by mouth daily with breakfast.   Spiriva Respimat 2.5 MCG/ACT Aers Generic drug: Tiotropium Bromide Monohydrate INHALE 2 PUFFS EVERY DAY   terbinafine 250 MG tablet Commonly known as: LamISIL Take 1 tablet (250 mg total) by mouth daily.   vitamin B-12 100 MCG tablet Commonly known as: CYANOCOBALAMIN Take 100 mcg by mouth daily.        Follow-up Information     Hoy Register, MD. Schedule an appointment as soon as possible for a visit in 1 week(s).   Specialty: Family Medicine Contact information: [address] [place] [place] [address] [phone]                Allergies  Allergen Reactions   Hydrocodone Nausea And Vomiting   Propoxyphene N-Acetaminophen Nausea And Vomiting   Augmentin [Amoxicillin-Pot Clavulanate] Rash    Consultations: None   Procedures/Studies: CT Angio Chest PE W and/or Wo Contrast  Result Date: 05/21/2023 CLINICAL DATA:  Pulmonary embolus suspected with high probability. Shortness of breath and productive cough for 1 day. Sepsis. Acute nonlocalized abdominal pain. EXAM: CT ANGIOGRAPHY CHEST CT ABDOMEN AND PELVIS WITH CONTRAST TECHNIQUE: Multidetector CT imaging of the chest was performed using the standard protocol during bolus administration of intravenous contrast.  Multiplanar CT image reconstructions and MIPs were obtained to evaluate the vascular anatomy. Multidetector CT imaging of the abdomen and pelvis was performed using the standard protocol during bolus administration of intravenous contrast. RADIATION DOSE REDUCTION: This exam was performed according to the departmental dose-optimization program which includes automated exposure control, adjustment of the mA and/or kV according to patient size and/or use of iterative reconstruction technique. CONTRAST:  OMNIPAQUE IOHEXOL 350 MG/ML SOLN COMPARISON:  CT chest 05/02/2023 FINDINGS: CTA CHEST FINDINGS Cardiovascular: There is good opacification of the central and segmental pulmonary arteries. No focal filling defects. No evidence of significant pulmonary embolus. Normal heart size. No pericardial effusions. Normal caliber thoracic aorta. No aortic dissection. Calcification of the aorta and coronary arteries. Mediastinum/Nodes: Small esophageal hiatal hernia. Esophagus is decompressed. No significant lymphadenopathy in the chest. Focal calcification in the right lobe of the thyroid gland measuring 1.5 cm diameter, unchanged since prior study. This thyroid lesion has been unchanged on prior studies dating back to 08/29/2015. Long-term stability suggests benign etiology and no additional follow-up is indicated. Lungs/Pleura: Severe diffuse emphysematous changes throughout the lungs. Bronchial wall thickening and bronchiectasis consistent with chronic bronchitic changes. Scattered fibrosis. No focal consolidation or edema. Nodule in the left lung base measuring  4 mm diameter. Series 12, image 122. 5.5 mm nodule in the left lingula, image 77. No change since prior study. This is evaluated on patient's routine screening lung CT scans. See prior reports and recommendations. Musculoskeletal: Degenerative changes in the thoracic spine. Multiple vertebral compression deformities appear old and likely indicate osteoporosis.  Review of the MIP images confirms the above findings. CT ABDOMEN and PELVIS FINDINGS Hepatobiliary: Cholelithiasis. No gallbladder wall thickening or edema. Bile ducts are normal. No focal liver lesions. Pancreas: Unremarkable. No pancreatic ductal dilatation or surrounding inflammatory changes. Spleen: Scattered calcified granulomas in the spleen. Adrenals/Urinary Tract: No adrenal gland nodules. Nephrograms are homogeneous and symmetrical. Prominent extrarenal pelvis on the right. No evidence suggesting obstruction. Bladder is normal. Stomach/Bowel: Stomach is within normal limits. Appendix appears normal. No evidence of bowel wall thickening, distention, or inflammatory changes. Vascular/Lymphatic: Severe calcification of the aorta and major branch vessels. Probable moderate to severe calcific stenosis in the common femoral and superficial femoral arteries with at least moderate stenosis in the external iliac arteries bilaterally. No significant lymphadenopathy. Reproductive: Status post hysterectomy. No adnexal masses. Other: No abdominal wall hernia or abnormality. No abdominopelvic ascites. Musculoskeletal: Multiple chronic vertebral compression deformities likely indicating osteoporosis. No acute bony abnormalities are suggested. Review of the MIP images confirms the above findings. IMPRESSION: 1. No evidence of significant pulmonary embolus. 2. Severe diffuse emphysematous changes and chronic bronchitic changes in the lungs. 3. Pulmonary nodules measuring up to 5.5 mm, unchanged since prior studies. The patient received routine chest CT screening studies. See prior reports and recommendations. 4. Multiple chronic appearing vertebral compression deformities in the thoracic and lumbar spine likely indicate osteoporosis. 5. Cholelithiasis. 6. Severe aortic atherosclerosis. Electronically Signed   By: Burman Nieves M.D.   On: 05/21/2023 18:53   CT ABDOMEN PELVIS W CONTRAST  Result Date:  05/21/2023 CLINICAL DATA:  Pulmonary embolus suspected with high probability. Shortness of breath and productive cough for 1 day. Sepsis. Acute nonlocalized abdominal pain. EXAM: CT ANGIOGRAPHY CHEST CT ABDOMEN AND PELVIS WITH CONTRAST TECHNIQUE: Multidetector CT imaging of the chest was performed using the standard protocol during bolus administration of intravenous contrast. Multiplanar CT image reconstructions and MIPs were obtained to evaluate the vascular anatomy. Multidetector CT imaging of the abdomen and pelvis was performed using the standard protocol during bolus administration of intravenous contrast. RADIATION DOSE REDUCTION: This exam was performed according to the departmental dose-optimization program which includes automated exposure control, adjustment of the mA and/or kV according to patient size and/or use of iterative reconstruction technique. CONTRAST:  OMNIPAQUE IOHEXOL 350 MG/ML SOLN COMPARISON:  CT chest 05/02/2023 FINDINGS: CTA CHEST FINDINGS Cardiovascular: There is good opacification of the central and segmental pulmonary arteries. No focal filling defects. No evidence of significant pulmonary embolus. Normal heart size. No pericardial effusions. Normal caliber thoracic aorta. No aortic dissection. Calcification of the aorta and coronary arteries. Mediastinum/Nodes: Small esophageal hiatal hernia. Esophagus is decompressed. No significant lymphadenopathy in the chest. Focal calcification in the right lobe of the thyroid gland measuring 1.5 cm diameter, unchanged since prior study. This thyroid lesion has been unchanged on prior studies dating back to 08/29/2015. Long-term stability suggests benign etiology and no additional follow-up is indicated. Lungs/Pleura: Severe diffuse emphysematous changes throughout the lungs. Bronchial wall thickening and bronchiectasis consistent with chronic bronchitic changes. Scattered fibrosis. No focal consolidation or edema. Nodule in the left lung  base measuring 4 mm diameter. Series 12, image 122. 5.5 mm nodule in the left lingula, image 77. No  change since prior study. This is evaluated on patient's routine screening lung CT scans. See prior reports and recommendations. Musculoskeletal: Degenerative changes in the thoracic spine. Multiple vertebral compression deformities appear old and likely indicate osteoporosis. Review of the MIP images confirms the above findings. CT ABDOMEN and PELVIS FINDINGS Hepatobiliary: Cholelithiasis. No gallbladder wall thickening or edema. Bile ducts are normal. No focal liver lesions. Pancreas: Unremarkable. No pancreatic ductal dilatation or surrounding inflammatory changes. Spleen: Scattered calcified granulomas in the spleen. Adrenals/Urinary Tract: No adrenal gland nodules. Nephrograms are homogeneous and symmetrical. Prominent extrarenal pelvis on the right. No evidence suggesting obstruction. Bladder is normal. Stomach/Bowel: Stomach is within normal limits. Appendix appears normal. No evidence of bowel wall thickening, distention, or inflammatory changes. Vascular/Lymphatic: Severe calcification of the aorta and major branch vessels. Probable moderate to severe calcific stenosis in the common femoral and superficial femoral arteries with at least moderate stenosis in the external iliac arteries bilaterally. No significant lymphadenopathy. Reproductive: Status post hysterectomy. No adnexal masses. Other: No abdominal wall hernia or abnormality. No abdominopelvic ascites. Musculoskeletal: Multiple chronic vertebral compression deformities likely indicating osteoporosis. No acute bony abnormalities are suggested. Review of the MIP images confirms the above findings. IMPRESSION: 1. No evidence of significant pulmonary embolus. 2. Severe diffuse emphysematous changes and chronic bronchitic changes in the lungs. 3. Pulmonary nodules measuring up to 5.5 mm, unchanged since prior studies. The patient received routine chest CT  screening studies. See prior reports and recommendations. 4. Multiple chronic appearing vertebral compression deformities in the thoracic and lumbar spine likely indicate osteoporosis. 5. Cholelithiasis. 6. Severe aortic atherosclerosis. Electronically Signed   By: Burman Nieves M.D.   On: 05/21/2023 18:53   DG Chest Port 1 View  Result Date: 05/21/2023 CLINICAL DATA:  Cough and COPD EXAM: PORTABLE CHEST 1 VIEW COMPARISON:  X-ray 04/22/2023 FINDINGS: Hyperinflation with chronic changes. The right inferior costophrenic angle is clipped off the edge of the film. Minimal left basilar scar or atelectasis. No consolidation, pneumothorax or effusion. No edema. Normal cardiopericardial silhouette. Calcified aorta. Overlapping cardiac leads. Osteopenia IMPRESSION: Hyperinflation with chronic changes. Electronically Signed   By: Karen Kays M.D.   On: 05/21/2023 16:21   CT CHEST LCS NODULE F/U LOW DOSE WO CONTRAST  Result Date: 05/10/2023 CLINICAL DATA:  Abnormal lung cancer screening CT, 50 pack-year smoking history. EXAM: CT CHEST WITHOUT CONTRAST FOR LUNG CANCER SCREENING NODULE FOLLOW-UP TECHNIQUE: Multidetector CT imaging of the chest was performed following the standard protocol without IV contrast. RADIATION DOSE REDUCTION: This exam was performed according to the departmental dose-optimization program which includes automated exposure control, adjustment of the mA and/or kV according to patient size and/or use of iterative reconstruction technique. COMPARISON:  10/29/2022 and 07/27/2022. FINDINGS: Cardiovascular: Atherosclerotic calcification of the aorta, aortic valve and coronary arteries. Heart size normal. No pericardial effusion. Mediastinum/Nodes: No pathologically enlarged mediastinal or axillary lymph nodes. Hilar regions are difficult to definitively evaluate without IV contrast. Esophagus is grossly unremarkable. Lungs/Pleura: Severe centrilobular emphysema. Scattered pulmonary parenchymal  scarring and mucoid impaction. Pulmonary nodules measure 5.5 mm or less in size, as before. Calcified granulomas. No pleural fluid. Airway is unremarkable. Upper Abdomen: Visualized portions of the liver, adrenal glands, kidneys, spleen, pancreas, stomach and bowel are grossly unremarkable. No upper abdominal adenopathy. Musculoskeletal: Degenerative changes in the spine. Osteopenia. Thoracolumbar compression deformities, unchanged. T9 compression fracture is subacute to chronic in age. IMPRESSION: 1. Lung-RADS 2, benign appearance or behavior. Continue annual screening with low-dose chest CT without contrast in 12 months. 2.  Aortic atherosclerosis (ICD10-I70.0). Coronary artery calcification. 3.  Emphysema (ICD10-J43.9). Electronically Signed   By: Leanna Battles M.D.   On: 05/10/2023 13:55   (Echo, Carotid, EGD, Colonoscopy, ERCP)    Subjective: Patient seen and examined.  Denied any chest pain shortness of breath wheezing or cough.  Remains afebrile. Overnight did not urinate. Given Fleet enema, she had large bowel movement, 50 mL of urine output and is still retaining urine.  She was very hesitant to have Foley catheter placement, after counseling patient about risk of going home with urinary retention she agreed.  She will be discharged with catheter in.   Discharge Exam: Vitals:   05/24/23 0506 05/24/23 1222  BP: (!) 145/68 127/67  Pulse: (!) 101 (!) 114  Resp: 16   Temp: 98.5 F (36.9 C) 98.3 F (36.8 C)  SpO2: 98% 99%   Vitals:   05/23/23 2038 05/23/23 2057 05/24/23 0506 05/24/23 1222  BP: (!) 169/70  (!) 145/68 127/67  Pulse: (!) 106  (!) 101 (!) 114  Resp: 16  16   Temp: 98.4 F (36.9 C)  98.5 F (36.9 C) 98.3 F (36.8 C)  TempSrc: Oral  Oral Oral  SpO2: 98% 96% 98% 99%  Weight:      Height:        General: Pt is alert, awake, not in acute distress Comfortable on 3 L oxygen. Cardiovascular: RRR, S1/S2 +, no rubs, no gallops Respiratory: CTA bilaterally, no wheezing,  no rhonchi Abdominal: Soft, NT, ND, bowel sounds + Extremities: no edema, no cyanosis    The results of significant diagnostics from this hospitalization (including imaging, microbiology, ancillary and laboratory) are listed below for reference.     Microbiology: Recent Results (from the past 240 hour(s))  Urine Culture (for pregnant, neutropenic or urologic patients or patients with an indwelling urinary catheter)     Status: Abnormal   Collection Time: 05/21/23 12:05 PM   Specimen: Urine, Clean Catch  Result Value Ref Range Status   Specimen Description   Final    URINE, CLEAN CATCH Performed at [facility], [address] [place], [place] [address]    Special Requests   Final    NONE Performed at [facility], [address], [place], [place] [address]    Culture 40,000 COLONIES/mL ENTEROCOCCUS FAECALIS (A)  Final   Report Status 05/24/2023 FINAL  Final   Organism ID, Bacteria ENTEROCOCCUS FAECALIS (A)  Final      Susceptibility   Enterococcus faecalis - MIC*    AMPICILLIN <=2 SENSITIVE Sensitive     NITROFURANTOIN <=16 SENSITIVE Sensitive     VANCOMYCIN 1 SENSITIVE Sensitive     * 40,000 COLONIES/mL ENTEROCOCCUS FAECALIS  Blood Culture (routine x 2)     Status: None (Preliminary result)   Collection Time: 05/21/23  4:16 PM   Specimen: BLOOD  Result Value Ref Range Status   Specimen Description   Final    BLOOD SITE NOT SPECIFIED Performed at [facility], [address], [place], [place] [address]    Special Requests   Final    BOTTLES DRAWN AEROBIC AND ANAEROBIC Blood Culture adequate volume Performed at [facility], [address] [place], [place] [address]    Culture   Final    NO GROWTH 3 DAYS Performed at [facility], [address] [place], [place] [address]    Report Status PENDING  Incomplete  Resp panel by RT-PCR (RSV, Flu A&B,  Covid) Anterior Nasal Swab     Status:  None   Collection Time: 05/21/23  7:10 PM   Specimen: Anterior Nasal Swab  Result Value Ref Range Status   SARS Coronavirus 2 by RT PCR NEGATIVE NEGATIVE Final    Comment: (NOTE) SARS-CoV-2 target nucleic acids are NOT DETECTED.  The SARS-CoV-2 RNA is generally detectable in upper respiratory specimens during the acute phase of infection. The lowest concentration of SARS-CoV-2 viral copies this assay can detect is 138 copies/mL. A negative result does not preclude SARS-Cov-2 infection and should not be used as the sole basis for treatment or other patient management decisions. A negative result may occur with  improper specimen collection/handling, submission of specimen other than nasopharyngeal swab, presence of viral mutation(s) within the areas targeted by this assay, and inadequate number of viral copies(<138 copies/mL). A negative result must be combined with clinical observations, patient history, and epidemiological information. The expected result is Negative.  Fact Sheet for Patients:  BloggerCourse.com  Fact Sheet for Healthcare Providers:  SeriousBroker.it  This test is no t yet approved or cleared by the [place] FDA and  has been authorized for detection and/or diagnosis of SARS-CoV-2 by FDA under an Emergency Use Authorization (EUA). This EUA will remain  in effect (meaning this test can be used) for the duration of the COVID-19 declaration under Section 564(b)(1) of the Act, 21 U.S.C.section 360bbb-3(b)(1), unless the authorization is terminated  or revoked sooner.       Influenza A by PCR NEGATIVE NEGATIVE Final   Influenza B by PCR NEGATIVE NEGATIVE Final    Comment: (NOTE) The Xpert Xpress SARS-CoV-2/FLU/RSV plus assay is intended as an aid in the diagnosis of influenza from Nasopharyngeal swab specimens and should not be used as a sole basis for treatment. Nasal washings and aspirates are unacceptable  for Xpert Xpress SARS-CoV-2/FLU/RSV testing.  Fact Sheet for Patients: BloggerCourse.com  Fact Sheet for Healthcare Providers: SeriousBroker.it  This test is not yet approved or cleared by the [place] FDA and has been authorized for detection and/or diagnosis of SARS-CoV-2 by FDA under an Emergency Use Authorization (EUA). This EUA will remain in effect (meaning this test can be used) for the duration of the COVID-19 declaration under Section 564(b)(1) of the Act, 21 U.S.C. section 360bbb-3(b)(1), unless the authorization is terminated or revoked.     Resp Syncytial Virus by PCR NEGATIVE NEGATIVE Final    Comment: (NOTE) Fact Sheet for Patients: BloggerCourse.com  Fact Sheet for Healthcare Providers: SeriousBroker.it  This test is not yet approved or cleared by the [place] FDA and has been authorized for detection and/or diagnosis of SARS-CoV-2 by FDA under an Emergency Use Authorization (EUA). This EUA will remain in effect (meaning this test can be used) for the duration of the COVID-19 declaration under Section 564(b)(1) of the Act, 21 U.S.C. section 360bbb-3(b)(1), unless the authorization is terminated or revoked.  Performed at [facility], [address] [place], [place] [address]   Respiratory (~20 pathogens) panel by PCR     Status: None   Collection Time: 05/22/23 12:30 AM   Specimen: Nasopharyngeal Swab; Respiratory  Result Value Ref Range Status   Adenovirus NOT DETECTED NOT DETECTED Final   Coronavirus 229E NOT DETECTED NOT DETECTED Final    Comment: (NOTE) The Coronavirus on the Respiratory Panel, DOES NOT test for the novel  Coronavirus (2019 nCoV)    Coronavirus HKU1 NOT DETECTED NOT DETECTED Final   Coronavirus NL63 NOT DETECTED NOT  DETECTED Final   Coronavirus OC43 NOT DETECTED NOT DETECTED Final   Metapneumovirus NOT  DETECTED NOT DETECTED Final   Rhinovirus / Enterovirus NOT DETECTED NOT DETECTED Final   Influenza A NOT DETECTED NOT DETECTED Final   Influenza B NOT DETECTED NOT DETECTED Final   Parainfluenza Virus 1 NOT DETECTED NOT DETECTED Final   Parainfluenza Virus 2 NOT DETECTED NOT DETECTED Final   Parainfluenza Virus 3 NOT DETECTED NOT DETECTED Final   Parainfluenza Virus 4 NOT DETECTED NOT DETECTED Final   Respiratory Syncytial Virus NOT DETECTED NOT DETECTED Final   Bordetella pertussis NOT DETECTED NOT DETECTED Final   Bordetella Parapertussis NOT DETECTED NOT DETECTED Final   Chlamydophila pneumoniae NOT DETECTED NOT DETECTED Final   Mycoplasma pneumoniae NOT DETECTED NOT DETECTED Final    Comment: Performed at [facility], [address], [place], [place] [address]  Blood Culture (routine x 2)     Status: None (Preliminary result)   Collection Time: 05/22/23  4:24 AM   Specimen: BLOOD RIGHT HAND  Result Value Ref Range Status   Specimen Description   Final    BLOOD RIGHT HAND Performed at [facility] Lab, [address], [place], [place] [address]    Special Requests   Final    BOTTLES DRAWN AEROBIC AND ANAEROBIC Blood Culture adequate volume Performed at [facility], [address], [place], [place] [address]    Culture   Final    NO GROWTH 2 DAYS Performed at [facility], [address] [place], [place] [address]    Report Status PENDING  Incomplete  Expectorated Sputum Assessment w Gram Stain, Rflx to Resp Cult     Status: None   Collection Time: 05/23/23 10:00 AM   Specimen: Sputum  Result Value Ref Range Status   Specimen Description SPUTUM  Final   Special Requests NONE  Final   Sputum evaluation   Final    THIS SPECIMEN IS ACCEPTABLE FOR SPUTUM CULTURE Performed at [facility], [address], [place], [place] [address]    Report Status 05/23/2023 FINAL  Final  Culture, Respiratory w Gram Stain     Status: None  (Preliminary result)   Collection Time: 05/23/23 10:00 AM   Specimen: SPU  Result Value Ref Range Status   Specimen Description   Final    SPUTUM Performed at [facility], [address] [place], [place] [address]    Special Requests   Final    NONE Reflexed from [id number] Performed at [facility], [address] [place], [place] [address]    Gram Stain   Final    ABUNDANT WBC PRESENT, PREDOMINANTLY PMN RARE YEAST    Culture   Final    CULTURE REINCUBATED FOR BETTER GROWTH Performed at [facility], [address], [place], [place] [address]    Report Status PENDING  Incomplete     Labs: BNP (last 3 results) Recent Labs    05/21/23 1616  BNP 61.4   Basic Metabolic Panel: Recent Labs  Lab 05/21/23 1616 05/22/23 0424  NA 138 139  K 3.4* 3.9  CL 103 107  CO2 25 24  GLUCOSE 128* 103*  BUN 11 7*  CREATININE 0.52 0.44  CALCIUM 9.2 8.9  MG  --  2.0   Liver Function Tests: Recent Labs  Lab 05/21/23 1616 05/22/23 0424  AST 20 17  ALT 13 10  ALKPHOS 72 56  BILITOT 0.8 0.6  PROT 8.0 6.4*  ALBUMIN 4.4 3.6   Recent Labs  Lab 05/21/23 1616  LIPASE 36   No results for input(s): "AMMONIA" in the last 168 hours. CBC: Recent Labs  Lab 05/21/23 1616 05/22/23 0424 05/23/23 1216  WBC 16.0* 13.1* 11.1*  HGB 12.6 10.8* 11.3*  HCT 38.9 33.7* 36.6  MCV 94.2 93.4 95.1  PLT 259 209 210   Cardiac Enzymes: No results for input(s): "CKTOTAL", "CKMB", "CKMBINDEX", "TROPONINI" in the last 168 hours. BNP: Invalid input(s): "POCBNP" CBG: Recent Labs  Lab 05/24/23 1220  GLUCAP 137*   D-Dimer Recent Labs    05/21/23 1616  DDIMER <0.27   Hgb A1c No results for input(s): "HGBA1C" in the last 72 hours. Lipid Profile No results for input(s): "CHOL", "HDL", "LDLCALC", "TRIG", "CHOLHDL", "LDLDIRECT" in the last 72 hours. Thyroid function studies No results for input(s): "TSH", "T4TOTAL", "T3FREE", "THYROIDAB" in the  last 72 hours.  Invalid input(s): "FREET3" Anemia work up No results for input(s): "VITAMINB12", "FOLATE", "FERRITIN", "TIBC", "IRON", "RETICCTPCT" in the last 72 hours. Urinalysis    Component Value Date/Time   COLORURINE YELLOW 05/21/2023 1749   APPEARANCEUR HAZY (A) 05/21/2023 1749   LABSPEC 1.015 05/21/2023 1749   PHURINE 8.0 05/21/2023 1749   GLUCOSEU NEGATIVE 05/21/2023 1749   HGBUR NEGATIVE 05/21/2023 1749   HGBUR trace-lysed 01/21/2009 0954   BILIRUBINUR NEGATIVE 05/21/2023 1749   BILIRUBINUR negative 07/04/2022 1625   KETONESUR 20 (A) 05/21/2023 1749   PROTEINUR NEGATIVE 05/21/2023 1749   UROBILINOGEN 0.2 07/04/2022 1625   UROBILINOGEN 0.2 01/21/2009 0954   NITRITE NEGATIVE 05/21/2023 1749   LEUKOCYTESUR SMALL (A) 05/21/2023 1749   Sepsis Labs Recent Labs  Lab 05/21/23 1616 05/22/23 0424 05/23/23 1216  WBC 16.0* 13.1* 11.1*   Microbiology Recent Results (from the past 240 hour(s))  Urine Culture (for pregnant, neutropenic or urologic patients or patients with an indwelling urinary catheter)     Status: Abnormal   Collection Time: 05/21/23 12:05 PM   Specimen: Urine, Clean Catch  Result Value Ref Range Status   Specimen Description   Final    URINE, CLEAN CATCH Performed at [facility], [address] [place], [place] [address]    Special Requests   Final    NONE Performed at [facility], [address] [place], [place] [address]    Culture 40,000 COLONIES/mL ENTEROCOCCUS FAECALIS (A)  Final   Report Status 05/24/2023 FINAL  Final   Organism ID, Bacteria ENTEROCOCCUS FAECALIS (A)  Final      Susceptibility   Enterococcus faecalis - MIC*    AMPICILLIN <=2 SENSITIVE Sensitive     NITROFURANTOIN <=16 SENSITIVE Sensitive     VANCOMYCIN 1 SENSITIVE Sensitive     * 40,000 COLONIES/mL ENTEROCOCCUS FAECALIS  Blood Culture (routine x 2)     Status: None (Preliminary result)   Collection Time: 05/21/23  4:16 PM    Specimen: BLOOD  Result Value Ref Range Status   Specimen Description   Final    BLOOD SITE NOT SPECIFIED Performed at [facility], [address], [place], [place] [address]    Special Requests   Final    BOTTLES DRAWN AEROBIC AND ANAEROBIC Blood Culture adequate volume Performed at [facility], [address], [place], [place] [address]    Culture   Final    NO GROWTH 3 DAYS Performed at [facility], [address] [place], [place] [address]  Report Status PENDING  Incomplete  Resp panel by RT-PCR (RSV, Flu A&B, Covid) Anterior Nasal Swab     Status: None   Collection Time: 05/21/23  7:10 PM   Specimen: Anterior Nasal Swab  Result Value Ref Range Status   SARS Coronavirus 2 by RT PCR NEGATIVE NEGATIVE Final    Comment: (NOTE) SARS-CoV-2 target nucleic acids are NOT DETECTED.  The SARS-CoV-2 RNA is generally detectable in upper respiratory specimens during the acute phase of infection. The lowest concentration of SARS-CoV-2 viral copies this assay can detect is 138 copies/mL. A negative result does not preclude SARS-Cov-2 infection and should not be used as the sole basis for treatment or other patient management decisions. A negative result may occur with  improper specimen collection/handling, submission of specimen other than nasopharyngeal swab, presence of viral mutation(s) within the areas targeted by this assay, and inadequate number of viral copies(<138 copies/mL). A negative result must be combined with clinical observations, patient history, and epidemiological information. The expected result is Negative.  Fact Sheet for Patients:  BloggerCourse.com  Fact Sheet for Healthcare Providers:  SeriousBroker.it  This test is no t yet approved or cleared by the [place] FDA and  has been authorized for detection and/or diagnosis of SARS-CoV-2 by FDA under an  Emergency Use Authorization (EUA). This EUA will remain  in effect (meaning this test can be used) for the duration of the COVID-19 declaration under Section 564(b)(1) of the Act, 21 U.S.C.section 360bbb-3(b)(1), unless the authorization is terminated  or revoked sooner.       Influenza A by PCR NEGATIVE NEGATIVE Final   Influenza B by PCR NEGATIVE NEGATIVE Final    Comment: (NOTE) The Xpert Xpress SARS-CoV-2/FLU/RSV plus assay is intended as an aid in the diagnosis of influenza from Nasopharyngeal swab specimens and should not be used as a sole basis for treatment. Nasal washings and aspirates are unacceptable for Xpert Xpress SARS-CoV-2/FLU/RSV testing.  Fact Sheet for Patients: BloggerCourse.com  Fact Sheet for Healthcare Providers: SeriousBroker.it  This test is not yet approved or cleared by the [place] FDA and has been authorized for detection and/or diagnosis of SARS-CoV-2 by FDA under an Emergency Use Authorization (EUA). This EUA will remain in effect (meaning this test can be used) for the duration of the COVID-19 declaration under Section 564(b)(1) of the Act, 21 U.S.C. section 360bbb-3(b)(1), unless the authorization is terminated or revoked.     Resp Syncytial Virus by PCR NEGATIVE NEGATIVE Final    Comment: (NOTE) Fact Sheet for Patients: BloggerCourse.com  Fact Sheet for Healthcare Providers: SeriousBroker.it  This test is not yet approved or cleared by the [place] FDA and has been authorized for detection and/or diagnosis of SARS-CoV-2 by FDA under an Emergency Use Authorization (EUA). This EUA will remain in effect (meaning this test can be used) for the duration of the COVID-19 declaration under Section 564(b)(1) of the Act, 21 U.S.C. section 360bbb-3(b)(1), unless the authorization is terminated or revoked.  Performed at [facility], [address] [place], [place] [address]   Respiratory (~20 pathogens) panel by PCR     Status: None   Collection Time: 05/22/23 12:30 AM   Specimen: Nasopharyngeal Swab; Respiratory  Result Value Ref Range Status   Adenovirus NOT DETECTED NOT DETECTED Final   Coronavirus 229E NOT DETECTED NOT DETECTED Final    Comment: (NOTE) The Coronavirus on the Respiratory Panel, DOES NOT test for the novel  Coronavirus (2019 nCoV)    Coronavirus  HKU1 NOT DETECTED NOT DETECTED Final   Coronavirus NL63 NOT DETECTED NOT DETECTED Final   Coronavirus OC43 NOT DETECTED NOT DETECTED Final   Metapneumovirus NOT DETECTED NOT DETECTED Final   Rhinovirus / Enterovirus NOT DETECTED NOT DETECTED Final   Influenza A NOT DETECTED NOT DETECTED Final   Influenza B NOT DETECTED NOT DETECTED Final   Parainfluenza Virus 1 NOT DETECTED NOT DETECTED Final   Parainfluenza Virus 2 NOT DETECTED NOT DETECTED Final   Parainfluenza Virus 3 NOT DETECTED NOT DETECTED Final   Parainfluenza Virus 4 NOT DETECTED NOT DETECTED Final   Respiratory Syncytial Virus NOT DETECTED NOT DETECTED Final   Bordetella pertussis NOT DETECTED NOT DETECTED Final   Bordetella Parapertussis NOT DETECTED NOT DETECTED Final   Chlamydophila pneumoniae NOT DETECTED NOT DETECTED Final   Mycoplasma pneumoniae NOT DETECTED NOT DETECTED Final    Comment: Performed at [facility], [address], [place], [place] [address]  Blood Culture (routine x 2)     Status: None (Preliminary result)   Collection Time: 05/22/23  4:24 AM   Specimen: BLOOD RIGHT HAND  Result Value Ref Range Status   Specimen Description   Final    BLOOD RIGHT HAND Performed at [facility], [address], [place], [place] [address]    Special Requests   Final    BOTTLES DRAWN AEROBIC AND ANAEROBIC Blood Culture adequate volume Performed at [facility], [address] [place], [place] [address]    Culture   Final    NO GROWTH 2  DAYS Performed at [facility], [address], [place], [place] [address]    Report Status PENDING  Incomplete  Expectorated Sputum Assessment w Gram Stain, Rflx to Resp Cult     Status: None   Collection Time: 05/23/23 10:00 AM   Specimen: Sputum  Result Value Ref Range Status   Specimen Description SPUTUM  Final   Special Requests NONE  Final   Sputum evaluation   Final    THIS SPECIMEN IS ACCEPTABLE FOR SPUTUM CULTURE Performed at [facility], [address], [place], [place] [address]    Report Status 05/23/2023 FINAL  Final  Culture, Respiratory w Gram Stain     Status: None (Preliminary result)   Collection Time: 05/23/23 10:00 AM   Specimen: SPU  Result Value Ref Range Status   Specimen Description   Final    SPUTUM Performed at [facility], [address], [place], [place] [address]    Special Requests   Final    NONE Reflexed from [id number] Performed at [facility], [address], [place], [place] [address]    Gram Stain   Final    ABUNDANT WBC PRESENT, PREDOMINANTLY PMN RARE YEAST    Culture   Final    CULTURE REINCUBATED FOR BETTER GROWTH Performed at [facility], [address] [place], [place] [address]    Report Status PENDING  Incomplete     Time coordinating discharge:  35 minutes  SIGNED:   Dorcas Carrow, MD  [facility] 05/24/2023, 2:54 PM

## 2023-05-24 NOTE — Progress Notes (Signed)
PROGRESS NOTE    Margaret Henry  RUE:[id number] DOB: 12/24/1950 DOA: 05/21/2023 PCP: Hoy Register, MD    Brief Narrative:  Patient admitted with URI symptoms.  Remained in the hospital after discharge readiness due to significant urinary retention.   Assessment & Plan:   Discharge summary done by previous provider was reviewed and reconciled.  Edited to reflect no changes including going home with Foley catheter.   DVT prophylaxis: enoxaparin (LOVENOX) injection 40 mg Start: 05/22/23 1000 SCDs Start: 05/21/23 2256   Code Status: Full code Family Communication: None at bedside Disposition Plan: Status is: Inpatient Remains inpatient appropriate because: Discharge today     Consultants:  None  Procedures:  None  Antimicrobials:  Completed   Subjective: Patient seen and examined multiple times today.  She was retaining more than 1 L, straight cath 4 times with more than 500 mL urine output each time. Fleet enema, large amount of stool but no urine output. Patient needed multiple counseling sessions that she will benefit with Foley catheter.  Objective: Vitals:   05/23/23 2038 05/23/23 2057 05/24/23 0506 05/24/23 1222  BP: (!) 169/70  (!) 145/68 127/67  Pulse: (!) 106  (!) 101 (!) 114  Resp: 16  16   Temp: 98.4 F (36.9 C)  98.5 F (36.9 C) 98.3 F (36.8 C)  TempSrc: Oral  Oral Oral  SpO2: 98% 96% 98% 99%  Weight:      Height:        Intake/Output Summary (Last 24 hours) at 05/24/2023 1450 Last data filed at 05/24/2023 1215 Gross per 24 hour  Intake 360 ml  Output 1208 ml  Net -848 ml   Filed Weights   05/21/23 1505 05/21/23 2200  Weight: 47.6 kg 47.5 kg   Patient looks comfortable.  She is on 2 L oxygen. Bilateral lungs, no acute findings.   Data Reviewed: I have personally reviewed following labs and imaging studies  CBC: Recent Labs  Lab 05/21/23 1616 05/22/23 0424 05/23/23 1216  WBC 16.0* 13.1* 11.1*  HGB 12.6 10.8* 11.3*  HCT 38.9  33.7* 36.6  MCV 94.2 93.4 95.1  PLT 259 209 210   Basic Metabolic Panel: Recent Labs  Lab 05/21/23 1616 05/22/23 0424  NA 138 139  K 3.4* 3.9  CL 103 107  CO2 25 24  GLUCOSE 128* 103*  BUN 11 7*  CREATININE 0.52 0.44  CALCIUM 9.2 8.9  MG  --  2.0   GFR: Estimated Creatinine Clearance: 47.7 mL/min (by C-G formula based on SCr of 0.44 mg/dL). Liver Function Tests: Recent Labs  Lab 05/21/23 1616 05/22/23 0424  AST 20 17  ALT 13 10  ALKPHOS 72 56  BILITOT 0.8 0.6  PROT 8.0 6.4*  ALBUMIN 4.4 3.6   Recent Labs  Lab 05/21/23 1616  LIPASE 36   No results for input(s): "AMMONIA" in the last 168 hours. Coagulation Profile: Recent Labs  Lab 05/21/23 1616  INR 1.1   Cardiac Enzymes: No results for input(s): "CKTOTAL", "CKMB", "CKMBINDEX", "TROPONINI" in the last 168 hours. BNP (last 3 results) No results for input(s): "PROBNP" in the last 8760 hours. HbA1C: No results for input(s): "HGBA1C" in the last 72 hours. CBG: Recent Labs  Lab 05/24/23 1220  GLUCAP 137*   Lipid Profile: No results for input(s): "CHOL", "HDL", "LDLCALC", "TRIG", "CHOLHDL", "LDLDIRECT" in the last 72 hours. Thyroid Function Tests: No results for input(s): "TSH", "T4TOTAL", "FREET4", "T3FREE", "THYROIDAB" in the last 72 hours. Anemia Panel: No results for input(s): "  VITAMINB12", "FOLATE", "FERRITIN", "TIBC", "IRON", "RETICCTPCT" in the last 72 hours. Sepsis Labs: Recent Labs  Lab 05/21/23 1547 05/21/23 1825 05/21/23 2057  PROCALCITON  --   --  <0.10  LATICACIDVEN 1.3 1.4  --     Recent Results (from the past 240 hour(s))  Urine Culture (for pregnant, neutropenic or urologic patients or patients with an indwelling urinary catheter)     Status: Abnormal   Collection Time: 05/21/23 12:05 PM   Specimen: Urine, Clean Catch  Result Value Ref Range Status   Specimen Description   Final    URINE, CLEAN CATCH Performed at [facility], [address] [place],  [place] [address]    Special Requests   Final    NONE Performed at [facility], [address] [place], [place] [address]    Culture 40,000 COLONIES/mL ENTEROCOCCUS FAECALIS (A)  Final   Report Status 05/24/2023 FINAL  Final   Organism ID, Bacteria ENTEROCOCCUS FAECALIS (A)  Final      Susceptibility   Enterococcus faecalis - MIC*    AMPICILLIN <=2 SENSITIVE Sensitive     NITROFURANTOIN <=16 SENSITIVE Sensitive     VANCOMYCIN 1 SENSITIVE Sensitive     * 40,000 COLONIES/mL ENTEROCOCCUS FAECALIS  Blood Culture (routine x 2)     Status: None (Preliminary result)   Collection Time: 05/21/23  4:16 PM   Specimen: BLOOD  Result Value Ref Range Status   Specimen Description   Final    BLOOD SITE NOT SPECIFIED Performed at [facility], [address], [place], [place] [address]    Special Requests   Final    BOTTLES DRAWN AEROBIC AND ANAEROBIC Blood Culture adequate volume Performed at [facility], [address] [place], [place] [address]    Culture   Final    NO GROWTH 3 DAYS Performed at [facility], [address], [place], [place] [address]    Report Status PENDING  Incomplete  Resp panel by RT-PCR (RSV, Flu A&B, Covid) Anterior Nasal Swab     Status: None   Collection Time: 05/21/23  7:10 PM   Specimen: Anterior Nasal Swab  Result Value Ref Range Status   SARS Coronavirus 2 by RT PCR NEGATIVE NEGATIVE Final    Comment: (NOTE) SARS-CoV-2 target nucleic acids are NOT DETECTED.  The SARS-CoV-2 RNA is generally detectable in upper respiratory specimens during the acute phase of infection. The lowest concentration of SARS-CoV-2 viral copies this assay can detect is 138 copies/mL. A negative result does not preclude SARS-Cov-2 infection and should not be used as the sole basis for treatment or other patient management decisions. A negative result may occur with  improper specimen collection/handling, submission of  specimen other than nasopharyngeal swab, presence of viral mutation(s) within the areas targeted by this assay, and inadequate number of viral copies(<138 copies/mL). A negative result must be combined with clinical observations, patient history, and epidemiological information. The expected result is Negative.  Fact Sheet for Patients:  BloggerCourse.com  Fact Sheet for Healthcare Providers:  SeriousBroker.it  This test is no t yet approved or cleared by the [place] FDA and  has been authorized for detection and/or diagnosis of SARS-CoV-2 by FDA under an Emergency Use Authorization (EUA). This EUA will remain  in effect (meaning this test can be used) for the duration of the COVID-19 declaration under Section 564(b)(1) of the Act, 21 U.S.C.section 360bbb-3(b)(1), unless the authorization is terminated  or revoked sooner.  Influenza A by PCR NEGATIVE NEGATIVE Final   Influenza B by PCR NEGATIVE NEGATIVE Final    Comment: (NOTE) The Xpert Xpress SARS-CoV-2/FLU/RSV plus assay is intended as an aid in the diagnosis of influenza from Nasopharyngeal swab specimens and should not be used as a sole basis for treatment. Nasal washings and aspirates are unacceptable for Xpert Xpress SARS-CoV-2/FLU/RSV testing.  Fact Sheet for Patients: BloggerCourse.com  Fact Sheet for Healthcare Providers: SeriousBroker.it  This test is not yet approved or cleared by the [place] FDA and has been authorized for detection and/or diagnosis of SARS-CoV-2 by FDA under an Emergency Use Authorization (EUA). This EUA will remain in effect (meaning this test can be used) for the duration of the COVID-19 declaration under Section 564(b)(1) of the Act, 21 U.S.C. section 360bbb-3(b)(1), unless the authorization is terminated or revoked.     Resp Syncytial Virus by PCR NEGATIVE NEGATIVE Final     Comment: (NOTE) Fact Sheet for Patients: BloggerCourse.com  Fact Sheet for Healthcare Providers: SeriousBroker.it  This test is not yet approved or cleared by the [place] FDA and has been authorized for detection and/or diagnosis of SARS-CoV-2 by FDA under an Emergency Use Authorization (EUA). This EUA will remain in effect (meaning this test can be used) for the duration of the COVID-19 declaration under Section 564(b)(1) of the Act, 21 U.S.C. section 360bbb-3(b)(1), unless the authorization is terminated or revoked.  Performed at [facility], [address] [place], [place] [address]   Respiratory (~20 pathogens) panel by PCR     Status: None   Collection Time: 05/22/23 12:30 AM   Specimen: Nasopharyngeal Swab; Respiratory  Result Value Ref Range Status   Adenovirus NOT DETECTED NOT DETECTED Final   Coronavirus 229E NOT DETECTED NOT DETECTED Final    Comment: (NOTE) The Coronavirus on the Respiratory Panel, DOES NOT test for the novel  Coronavirus (2019 nCoV)    Coronavirus HKU1 NOT DETECTED NOT DETECTED Final   Coronavirus NL63 NOT DETECTED NOT DETECTED Final   Coronavirus OC43 NOT DETECTED NOT DETECTED Final   Metapneumovirus NOT DETECTED NOT DETECTED Final   Rhinovirus / Enterovirus NOT DETECTED NOT DETECTED Final   Influenza A NOT DETECTED NOT DETECTED Final   Influenza B NOT DETECTED NOT DETECTED Final   Parainfluenza Virus 1 NOT DETECTED NOT DETECTED Final   Parainfluenza Virus 2 NOT DETECTED NOT DETECTED Final   Parainfluenza Virus 3 NOT DETECTED NOT DETECTED Final   Parainfluenza Virus 4 NOT DETECTED NOT DETECTED Final   Respiratory Syncytial Virus NOT DETECTED NOT DETECTED Final   Bordetella pertussis NOT DETECTED NOT DETECTED Final   Bordetella Parapertussis NOT DETECTED NOT DETECTED Final   Chlamydophila pneumoniae NOT DETECTED NOT DETECTED Final   Mycoplasma pneumoniae NOT DETECTED  NOT DETECTED Final    Comment: Performed at [facility], [address] [place], [place] [address]  Blood Culture (routine x 2)     Status: None (Preliminary result)   Collection Time: 05/22/23  4:24 AM   Specimen: BLOOD RIGHT HAND  Result Value Ref Range Status   Specimen Description   Final    BLOOD RIGHT HAND Performed at [facility], [address], [place], [place] [address]    Special Requests   Final    BOTTLES DRAWN AEROBIC AND ANAEROBIC Blood Culture adequate volume Performed at [facility], [address] [place], [place] [address]    Culture   Final    NO GROWTH  2 DAYS Performed at [facility], [address], [place], [place] [address]    Report Status PENDING  Incomplete  Expectorated Sputum Assessment w Gram Stain, Rflx to Resp Cult     Status: None   Collection Time: 05/23/23 10:00 AM   Specimen: Sputum  Result Value Ref Range Status   Specimen Description SPUTUM  Final   Special Requests NONE  Final   Sputum evaluation   Final    THIS SPECIMEN IS ACCEPTABLE FOR SPUTUM CULTURE Performed at [facility], [address], [place], [place] [address]    Report Status 05/23/2023 FINAL  Final  Culture, Respiratory w Gram Stain     Status: None (Preliminary result)   Collection Time: 05/23/23 10:00 AM   Specimen: SPU  Result Value Ref Range Status   Specimen Description   Final    SPUTUM Performed at [facility], [address] [place], [place] [address]    Special Requests   Final    NONE Reflexed from [id number] Performed at [facility], [address] [place], [place] [address]    Gram Stain   Final    ABUNDANT WBC PRESENT, PREDOMINANTLY PMN RARE YEAST    Culture   Final    CULTURE REINCUBATED FOR BETTER GROWTH Performed at [facility], [address], [place], [place] [address]    Report Status PENDING  Incomplete         Radiology Studies: No  results found.      Scheduled Meds:  albuterol  2.5 mg Nebulization BID   atorvastatin  40 mg Oral Daily   azithromycin  250 mg Oral QODAY   vitamin B-12  1,000 mcg Oral Daily   enoxaparin (LOVENOX) injection  40 mg Subcutaneous Q24H   ferrous sulfate  325 mg Oral QODAY   fluticasone furoate-vilanterol  1 puff Inhalation Daily   lactose free nutrition  237 mL Oral BID BM   lidocaine  1 patch Transdermal Q24H   loratadine  10 mg Oral Daily   mupirocin ointment  1 Application Nasal BID   pantoprazole  20 mg Oral Daily   predniSONE  10 mg Oral Q breakfast   sodium chloride flush  3 mL Intravenous Q12H   terbinafine  250 mg Oral Daily   tiotropium  18 mcg Inhalation Daily   Continuous Infusions:  ceFEPime (MAXIPIME) IV 200 mL/hr at 05/24/23 0700     LOS: 3 days    Time spent: 32 minutes    Dorcas Carrow, MD [facility]

## 2023-05-24 NOTE — Progress Notes (Signed)
Patient educated on discharge instructions as well as foley catheter maintenance and care. Discussed with patient about setting up an appointment with [facility] on Monday in regards to urinary retention per discharge instructions. Patient verbalized understanding about foley care and maintenance as well as other discharge instructions.

## 2023-05-24 NOTE — Progress Notes (Signed)
Patient having urinary retention during hospital stay as well as constipation. MD Ghimire ordered enema and foley cath placement. Patient did have BM, type 1 stool along with minimal urine output approximately 50 mls. Bladder scan still indicated 281 mls in bladder. Notified MD Ghimire of results and ordered to proceed with foley. Explained to patient that urine was still retaining and need to place foley. Patient agreed initially but once kit was opened and prior to insertion patient declined and refused. Patient was educated again why she needs the foley cath. MD Ghimire notified and ordered that she has to urinate prior to discharge.

## 2023-05-26 LAB — CULTURE, RESPIRATORY W GRAM STAIN

## 2023-05-27 ENCOUNTER — Telehealth: Payer: Self-pay | Admitting: Internal Medicine

## 2023-05-27 ENCOUNTER — Other Ambulatory Visit (HOSPITAL_BASED_OUTPATIENT_CLINIC_OR_DEPARTMENT_OTHER): Payer: Self-pay

## 2023-05-27 ENCOUNTER — Telehealth: Payer: Self-pay

## 2023-05-27 ENCOUNTER — Other Ambulatory Visit (HOSPITAL_COMMUNITY): Payer: Self-pay

## 2023-05-27 DIAGNOSIS — R339 Retention of urine, unspecified: Secondary | ICD-10-CM

## 2023-05-27 MED ORDER — CIPROFLOXACIN HCL 500 MG PO TABS
500.0000 mg | ORAL_TABLET | Freq: Two times a day (BID) | ORAL | 0 refills | Status: AC
  Filled 2023-05-27: qty 8, 4d supply, fill #0

## 2023-05-27 NOTE — Telephone Encounter (Signed)
Phone call placed to patient this morning.  I introduced myself to her and will let her know that I was covering for Dr. Alvis Lemmings who is on vacation.  I received the results of sputum culture that was done during her recent hospitalization.  This came back growing Pseudomonas.  I sent her pulmonologist Dr. Isaiah Serge a message regarding this inquiring whether he thinks the 4 days of antibiotics that she received in the hospital was sufficient.  He did reply recommending 4 more days of antibiotics namely ciprofloxacin.  Patient advised that I will send this prescription to her pharmacy. Patient tells me that she was sent home with a Foley catheter which is very uncomfortable for her and she would like to get it out.  She was told upon discharge to contact [facility] to see the urologist that she had seen in the past.  I told her that I will submit this referral and have our referral coordinator try to facilitate this for her.

## 2023-05-27 NOTE — Telephone Encounter (Signed)
-----   Message from [facility] sent at 05/26/2023  4:21 PM EDT ----- Hello, Thanks for the message.  I do not think this 4 days of cefepime would be adequate for her.  I would give her additional 4 days of oral ciprofloxacin.  Can you send the prescription or I can do it from my office. ----- Message ----- From: Marcine Matar, MD Sent: 05/26/2023   4:18 PM EDT To: Chilton Greathouse, MD  I am covering for Dr. Alvis Lemmings who is the PCP for this patient. I received sputum culture result growing Pseudomonas (showing greater sensitivity to Cipro than to ceftazidime ) from her recent hospitalization 8/13-16/2024.  Presented with fever, tachycardia and SOB. She received IV fluids and broad-spectrum antibiotics including Cefepime for 4 days.  COVID PCR and respiratory virus panel was negative.  Procalcitonin was negative.  Blood cultures were negative.  She was treated as a COPD exacerbation, did good clinical recovery.  Sent home on her maintenance Zithromax. Just wanted to touch base given the sputum cx results to make sure the 4 days of Cefepime that she received during hospitalization would be adequate.  Await your response. Thanks.

## 2023-05-27 NOTE — Telephone Encounter (Signed)
-----   Message from [facility] sent at 05/26/2023  4:21 PM EDT ----- Hello, Thanks for the message.  I do not think this 4 days of cefepime would be adequate for her.  I would give her additional 4 days of oral ciprofloxacin.  Can you send the prescription or I can do it from my office. ----- Message ----- From: Marcine Matar, MD Sent: 05/26/2023   4:18 PM EDT To: Chilton Greathouse, MD  I am covering for Dr. Alvis Lemmings who is the PCP for this patient. I received sputum culture result growing Pseudomonas (showing greater sensitivity to Cipro than to ceftazidime ) from her recent hospitalization 8/13-16/2024.  Presented with fever, tachycardia and SOB. She received IV fluids and broad-spectrum antibiotics including Cefepime for 4 days.  COVID PCR and respiratory virus panel was negative.  Procalcitonin was negative.  Blood cultures were negative.  She was treated as a COPD exacerbation, did good clinical recovery.  Sent home on her maintenance Zithromax. Just wanted to touch base given the sputum cx results to make sure the 4 days of Cefepime that she received during hospitalization would be adequate.  Await your response. Thanks.

## 2023-05-27 NOTE — Transitions of Care (Post Inpatient/ED Visit) (Signed)
05/27/2023  Name: Margaret Henry MRN: [id number] DOB: May 13, 1951  Today's TOC FU Call Status: Today's TOC FU Call Status:: Successful TOC FU Call Completed TOC FU Call Complete Date: 05/27/23  Transition Care Management Follow-up Telephone Call Date of Discharge: 05/24/23 Discharge Facility: [facility]) Type of Discharge: Inpatient Admission Primary Inpatient Discharge Diagnosis:: 'sepsis without acute organ dysfunction, due to unspecified organism" How have you been since you were released from the hospital?: Better (Pt states she is "doing okay except doesn't like having the catheter." States she has voiding trial appt tomorrow. She states urine is "dark colored'-admits to not drinking a lot of water/fluids-encouraged to drink more water. Appetite fair. BM yet.) Any questions or concerns?: No  Items Reviewed: Did you receive and understand the discharge instructions provided?: Yes Medications obtained,verified, and reconciled?: Yes (Medications Reviewed) (pt reports son has not picked up abx-Cipro as pharmacy was closed over weekend-will get it today, She is not taking Lidoderm patch as insurance will not cover it-reported she has back problems but hasn't been having pain) Any new allergies since your discharge?: No Dietary orders reviewed?: Yes Type of Diet Ordered:: low salt/heart healthy Do you have support at home?: Yes People in Home: alone, sibling(s), child(ren), adult Name of Support/Comfort Primary Source: pt states her son and sister are helping her out while she recovers  Medications Reviewed Today: Medications Reviewed Today     Reviewed by Charlyn Minerva, RN (Registered Nurse) on 05/27/23 at 1356  Med List Status: <None>   Medication Order Taking? Sig Documenting Provider Last Dose Status Informant  albuterol (PROVENTIL) (2.5 MG/3ML) 0.083% nebulizer solution [id number] Yes Take 2.5 mg by nebulization every 6 (six) hours as needed for wheezing or  shortness of breath. [provider] Taking Active Self, Pharmacy Records  atorvastatin (LIPITOR) 40 MG tablet [id number] Yes TAKE 1 TABLET EVERY DAY Georgeanna Lea, MD Taking Active Pharmacy Records, Self  azithromycin [facility]) 250 MG tablet [id number] Yes Take 1 tablet (250 mg total) by mouth daily.  Patient taking differently: Take 250 mg by mouth daily. Taking every other day   Chilton Greathouse, MD Taking Active Pharmacy Records, Self  bismuth subsalicylate (PEPTO BISMOL) 262 MG/15ML suspension [id number] Yes Take 30 mLs by mouth every 6 (six) hours as needed for indigestion. [provider] Taking Active Self, Pharmacy Records  BREO ELLIPTA 200-25 MCG/ACT AEPB [id number] Yes INHALE 1 PUFF EVERY DAY Mannam, Praveen, MD Taking Active Pharmacy Records, Self  ciprofloxacin (CIPRO) 500 MG tablet [id number]  Take 1 tablet (500 mg total) by mouth 2 (two) times daily for 4 days. Marcine Matar, MD  Active   ferrous sulfate 325 (65 FE) MG EC tablet [id number] Yes Take 325 mg by mouth every other day. Pt states she is only taking once a week due to constipation [provider] Taking Active Pharmacy Records, Self  lidocaine (LIDODERM) 5 % [id number] No Place 1 patch onto the skin daily. Remove & Discard patch within 12 hours or as directed by MD  Patient not taking: Reported on 05/27/2023   Arnetha Courser, MD Not Taking Active   loratadine (CLARITIN) 10 MG tablet [id number] Yes Take 1 tablet (10 mg total) by mouth daily. Hoy Register, MD Taking Active Self, Pharmacy Records  mupirocin ointment (BACTROBAN) 2 % [id number] Yes Apply 1 Application topically 2 (two) times daily. To affected nostril  Patient taking differently: Place 1 Application into the nose See admin instructions. Apply to affected (inner) nostril(s) one to two times  a day   Cobb, Ruby Cola, NP Taking Active Self, Pharmacy Records  nitroGLYCERIN (NITROSTAT) 0.4 MG SL tablet [id number]  Place 1 tablet (0.4 mg  total) under the tongue every 5 (five) minutes as needed for chest pain. Georgeanna Lea, MD  Active Pharmacy Records, Self  OXYGEN [id number] Yes Inhale 3 L/min into the lungs continuous. [provider] Taking Active Self, Pharmacy Records  pantoprazole (PROTONIX) 20 MG tablet [id number] Yes Take 1 tablet (20 mg total) by mouth daily. Doree Albee, PA-C Taking Active Pharmacy Records, Self  predniSONE (DELTASONE) 10 MG tablet [id number] No Take 1 tablet (10 mg total) by mouth daily with breakfast.  Patient not taking: Reported on 05/27/2023   Chilton Greathouse, MD Not Taking Active Pharmacy Records, Self  SPIRIVA RESPIMAT 2.5 MCG/ACT AERS [id number] Yes INHALE 2 PUFFS EVERY DAY Mannam, Praveen, MD Taking Active Pharmacy Records, Self  terbinafine (LAMISIL) 250 MG tablet [id number] Yes Take 1 tablet (250 mg total) by mouth daily. Hoy Register, MD Taking Active Pharmacy Records, Self  vitamin B-12 (CYANOCOBALAMIN) 100 MCG tablet [id number] Yes Take 100 mcg by mouth daily. [provider] Taking Active Pharmacy Records, Self            Home Care and Equipment/Supplies: Were Home Health Services Ordered?: NA Any new equipment or medical supplies ordered?: NA  Functional Questionnaire: Do you need assistance with bathing/showering or dressing?: Yes Do you need assistance with meal preparation?: Yes Do you need assistance with eating?: No Do you have difficulty maintaining continence: No Do you need assistance with getting out of bed/getting out of a chair/moving?: No Do you have difficulty managing or taking your medications?: No  Follow up appointments reviewed: PCP Follow-up appointment confirmed?: Yes Date of PCP follow-up appointment?: 06/04/23 Follow-up Provider: Dr. Alvis Lemmings Specialist [facility] Follow-up appointment confirmed?: Yes Date of Specialist follow-up appointment?: 05/28/23 Follow-Up Specialty Provider:: [facility] Do you need transportation to  your follow-up appointment?: No Do you understand care options if your condition(s) worsen?: Yes-patient verbalized understanding  SDOH Interventions Today    Flowsheet Row Most Recent Value  SDOH Interventions   Transportation Interventions Intervention Not Indicated      TOC Interventions Today    Flowsheet Row Most Recent Value  TOC Interventions   TOC Interventions Discussed/Reviewed TOC Interventions Discussed, S/S of infection      Interventions Today    Flowsheet Row Most Recent Value  Chronic Disease   Chronic disease during today's visit Chronic Obstructive Pulmonary Disease (COPD)  General Interventions   General Interventions Discussed/Reviewed General Interventions Discussed, Doctor Visits  Doctor Visits Discussed/Reviewed Doctor Visits Discussed, PCP, Specialist  PCP/Specialist Visits Compliance with follow-up visit  Education Interventions   Education Provided Provided Education  Provided Verbal Education On Medication, Nutrition, When to see the doctor, Other  [foley care & mgmt]  Nutrition Interventions   Nutrition Discussed/Reviewed Nutrition Discussed, Fluid intake, Increasing proteins, Decreasing fats, Decreasing salt  Pharmacy Interventions   Pharmacy Dicussed/Reviewed Pharmacy Topics Discussed, Medications and their functions  Safety Interventions   Safety Discussed/Reviewed Safety Discussed       Alessandra Grout [facility] Care Management Care Management Community Coordinator Direct Phone: [phone] Toll Free: [phone] Fax: [phone]

## 2023-05-28 DIAGNOSIS — R338 Other retention of urine: Secondary | ICD-10-CM | POA: Diagnosis not present

## 2023-05-28 DIAGNOSIS — R3914 Feeling of incomplete bladder emptying: Secondary | ICD-10-CM | POA: Diagnosis not present

## 2023-05-28 NOTE — Telephone Encounter (Signed)
Per epic patient has been discharged from the hospital.

## 2023-05-29 ENCOUNTER — Emergency Department (HOSPITAL_BASED_OUTPATIENT_CLINIC_OR_DEPARTMENT_OTHER)
Admission: EM | Admit: 2023-05-29 | Discharge: 2023-05-29 | Disposition: A | Discharge status: Home / Self Care | Payer: Medicare HMO | Attending: Emergency Medicine | Admitting: Emergency Medicine

## 2023-05-29 ENCOUNTER — Other Ambulatory Visit: Payer: Self-pay

## 2023-05-29 ENCOUNTER — Encounter (HOSPITAL_BASED_OUTPATIENT_CLINIC_OR_DEPARTMENT_OTHER): Payer: Self-pay

## 2023-05-29 DIAGNOSIS — J449 Chronic obstructive pulmonary disease, unspecified: Secondary | ICD-10-CM | POA: Insufficient documentation

## 2023-05-29 DIAGNOSIS — R339 Retention of urine, unspecified: Secondary | ICD-10-CM | POA: Diagnosis not present

## 2023-05-29 LAB — URINALYSIS, MICROSCOPIC (REFLEX)

## 2023-05-29 LAB — BASIC METABOLIC PANEL
Anion gap: 8 (ref 5–15)
BUN: 11 mg/dL (ref 8–23)
CO2: 29 mmol/L (ref 22–32)
Calcium: 8.8 mg/dL — ABNORMAL LOW (ref 8.9–10.3)
Chloride: 103 mmol/L (ref 98–111)
Creatinine, Ser: 0.47 mg/dL (ref 0.44–1.00)
GFR, Estimated: >60 mL/min (ref >60)
Glucose, Bld: 89 mg/dL (ref 70–99)
Potassium: 3.8 mmol/L (ref 3.5–5.1)
Sodium: 140 mmol/L (ref 135–145)

## 2023-05-29 LAB — URINALYSIS, ROUTINE W REFLEX MICROSCOPIC
Bilirubin Urine: NEGATIVE
Glucose, UA: NEGATIVE mg/dL
Ketones, ur: NEGATIVE mg/dL
Leukocytes,Ua: NEGATIVE
Nitrite: NEGATIVE
Protein, ur: NEGATIVE mg/dL
Specific Gravity, Urine: 1.02 (ref 1.005–1.030)
pH: 7 (ref 5.0–8.0)

## 2023-05-29 MED ORDER — LIDOCAINE HCL URETHRAL/MUCOSAL 2 % EX GEL
1.0000 | Freq: Once | CUTANEOUS | Status: AC
  Administered 2023-05-29: 1 via URETHRAL

## 2023-05-29 MED ORDER — LIDOCAINE HCL URETHRAL/MUCOSAL 2 % EX GEL
1.0000 | Freq: Once | CUTANEOUS | Status: DC | PRN
  Filled 2023-05-29: qty 30

## 2023-05-29 NOTE — ED Provider Notes (Signed)
[place] [facility] Provider Note   CSN: [id number] Arrival date & time: 05/29/23  1517     History  Chief Complaint  Patient presents with   Urinary Retention    Margaret Henry is a 72 y.o. female with a history of COPD, neuropathy, and hyperlipidemia presents to the ED today for urinary retention.  Patient reports that she was in the hospital last week, from 8/13-16, and during her stay she developed urinary retention.  She was discharged home with a catheter was seen by urology yesterday to have the catheter removed.  Since then, she has not been able to urinate and now feels pressure in her lower abdomen.  Denies any fever, nausea, vomiting, or changes to bowel habits.  No history of urinary or kidney stones.  No other complaints or concerns at this time.    Home Medications Prior to Admission medications   Medication Sig Start Date End Date Taking? Authorizing Provider  albuterol (PROVENTIL) (2.5 MG/3ML) 0.083% nebulizer solution Take 2.5 mg by nebulization every 6 (six) hours as needed for wheezing or shortness of breath.    [provider]  atorvastatin (LIPITOR) 40 MG tablet TAKE 1 TABLET EVERY DAY 12/18/22   Georgeanna Lea, MD  azithromycin (ZITHROMAX) 250 MG tablet Take 1 tablet (250 mg total) by mouth daily. Patient taking differently: Take 250 mg by mouth daily. Taking every other day 12/24/22   Chilton Greathouse, MD  bismuth subsalicylate (PEPTO BISMOL) 262 MG/15ML suspension Take 30 mLs by mouth every 6 (six) hours as needed for indigestion.    [provider]  Earlie Server 616-07 MCG/ACT AEPB INHALE 1 PUFF EVERY DAY 12/18/22   Mannam, Colbert Coyer, MD  ciprofloxacin (CIPRO) 500 MG tablet Take 1 tablet (500 mg total) by mouth 2 (two) times daily for 4 days. 05/27/23 06/01/23  Marcine Matar, MD  ferrous sulfate 325 (65 FE) MG EC tablet Take 325 mg by mouth every other day. Pt states she is only taking once a week due to  constipation    [provider]  lidocaine (LIDODERM) 5 % Place 1 patch onto the skin daily. Remove & Discard patch within 12 hours or as directed by MD Patient not taking: Reported on 05/27/2023 05/23/23   Arnetha Courser, MD  loratadine (CLARITIN) 10 MG tablet Take 1 tablet (10 mg total) by mouth daily. 01/03/23   Hoy Register, MD  mupirocin ointment (BACTROBAN) 2 % Apply 1 Application topically 2 (two) times daily. To affected nostril Patient taking differently: Place 1 Application into the nose See admin instructions. Apply to affected (inner) nostril(s) one to two times a day 05/31/22   Cobb, Ruby Cola, NP  nitroGLYCERIN (NITROSTAT) 0.4 MG SL tablet Place 1 tablet (0.4 mg total) under the tongue every 5 (five) minutes as needed for chest pain. 11/28/22   Georgeanna Lea, MD  OXYGEN Inhale 3 L/min into the lungs continuous.    [provider]  pantoprazole (PROTONIX) 20 MG tablet Take 1 tablet (20 mg total) by mouth daily. 09/27/22   Doree Albee, PA-C  predniSONE (DELTASONE) 10 MG tablet Take 1 tablet (10 mg total) by mouth daily with breakfast. Patient not taking: Reported on 05/27/2023 04/22/23   Chilton Greathouse, MD  SPIRIVA RESPIMAT 2.5 MCG/ACT AERS INHALE 2 PUFFS EVERY DAY 05/13/23   Mannam, Praveen, MD  terbinafine (LAMISIL) 250 MG tablet Take 1 tablet (250 mg total) by mouth daily. 01/03/23   Hoy Register, MD  vitamin B-12 (CYANOCOBALAMIN) 100 MCG tablet Take 100 mcg by mouth daily.    [provider]      Allergies    Hydrocodone, Propoxyphene n-acetaminophen, and Augmentin [amoxicillin-pot clavulanate]    Review of Systems   Review of Systems  Genitourinary:  Positive for decreased urine volume.  All other systems reviewed and are negative.   Physical Exam Updated Vital Signs BP (!) 158/62   Pulse 85   Temp 98 F (36.7 C)   Resp 18   Ht 5\' 4"  (1.626 m)   Wt 47 kg   SpO2 100%   BMI 17.79 kg/m  Physical Exam Vitals and nursing note  reviewed.  Constitutional:      Appearance: Normal appearance.  HENT:     Head: Normocephalic and atraumatic.     Mouth/Throat:     Mouth: Mucous membranes are moist.  Eyes:     Conjunctiva/sclera: Conjunctivae normal.     Pupils: Pupils are equal, round, and reactive to light.  Cardiovascular:     Rate and Rhythm: Normal rate and regular rhythm.     Pulses: Normal pulses.  Pulmonary:     Effort: Pulmonary effort is normal.     Breath sounds: Normal breath sounds.  Abdominal:     Palpations: Abdomen is soft.     Tenderness: There is no abdominal tenderness.  Skin:    General: Skin is warm and dry.     Findings: No rash.  Neurological:     General: No focal deficit present.     Mental Status: She is alert.  Psychiatric:        Mood and Affect: Mood normal.        Behavior: Behavior normal.     ED Results / Procedures / Treatments   Labs (all labs ordered are listed, but only abnormal results are displayed) Labs Reviewed  URINALYSIS, ROUTINE W REFLEX MICROSCOPIC - Abnormal; Notable for the following components:      Result Value   Hgb urine dipstick TRACE (*)    All other components within normal limits  URINALYSIS, MICROSCOPIC (REFLEX) - Abnormal; Notable for the following components:   Bacteria, UA RARE (*)    All other components within normal limits  BASIC METABOLIC PANEL - Abnormal; Notable for the following components:   Calcium 8.8 (*)    All other components within normal limits    EKG None  Radiology No results found.  Procedures Procedures: not indicated.   Medications Ordered in ED Medications  lidocaine (XYLOCAINE) 2 % jelly 1 Application (1 Application Urethral Given 05/29/23 0956)    ED Course/ Medical Decision Making/ A&P                                 Medical Decision Making Amount and/or Complexity of Data Reviewed Labs: ordered.  Risk Prescription drug management.   This patient presents to the ED for concern of urinary  retention, this involves an extensive number of treatment options, and is a complaint that carries with it a high risk of complications and morbidity.   Differential diagnosis includes: bladder obstruction, bladder spasm, etc.   Comorbidities  See HPI above   Additional History  Additional history obtained from patient's last hospital record.   Lab Tests  I ordered and personally interpreted labs.  The pertinent results include:   BMP is within normal limits - no signs of kidney injury. UA shows rare  bacteria without any leukocytes or nitrates.   Imaging Studies  I ordered imaging studies including bladder scan  I independently visualized and interpreted imaging which showed: 568 mL urine present I agree with the radiologist interpretation   Problem List / ED Course / Critical Interventions / Medication Management  Urinary retention 568 mL present on bladder scan today in the ED. Foley catheter was placed and appropriately draining on reevaluation. I have reviewed the patients home medicines and have made adjustments as needed   Social Determinants of Health  Tobacco use   Test / Admission - Considered  Discussed results with patient and son at bedside. All questions were answered. She is hemodynamically stable and safe for discharge home. Return precautions provided.       Final Clinical Impression(s) / ED Diagnoses Final diagnoses:  Urinary retention    Rx / DC Orders ED Discharge Orders     None         Maxwell Marion, PA-C 05/29/23 1913    Rozelle Logan, DO 05/30/23 1710

## 2023-05-29 NOTE — Discharge Instructions (Addendum)
As discussed, your urine did not show signs of infection and your kidney function is good as well. Please call your urologist today to let them know you were here and if they want to see you sooner.  Get help right away if: You have chills or a fever. You have blood in your pee. You have a tube that drains pee from the bladder and these things happen: The tube stops draining pee. The tube falls out.

## 2023-05-29 NOTE — ED Triage Notes (Signed)
The patient had a foley placed last week. She went to urology yesterday and they removed the foley. She stated she has only passes a small amount of urine since then. She stated she is having pain.

## 2023-06-04 ENCOUNTER — Telehealth: Payer: Self-pay

## 2023-06-04 ENCOUNTER — Inpatient Hospital Stay: Payer: Medicare HMO | Admitting: Family Medicine

## 2023-06-04 DIAGNOSIS — J449 Chronic obstructive pulmonary disease, unspecified: Secondary | ICD-10-CM

## 2023-06-04 NOTE — Telephone Encounter (Signed)
Copied from CRM [id number]. Topic: General - Other >> Jun 04, 2023  8:25 AM Margaret Henry A wrote: Reason for CRM: Pt is wanting to see if her PCP can send over a new order to [facility] for home health. Please advise.

## 2023-06-04 NOTE — Telephone Encounter (Signed)
Pt is requesting home health

## 2023-06-05 ENCOUNTER — Ambulatory Visit: Payer: Self-pay

## 2023-06-05 NOTE — Telephone Encounter (Signed)
[employer] called, left VM to return the call to the office to speak to the NT.   Summary: med info   Trey Paula from [place] called in states that pt is taking Meloxicam 7.5 mg every other day, but bottle says take every day. Caller phone ext is  [phone]

## 2023-06-05 NOTE — Telephone Encounter (Signed)
Referral has been placed. 

## 2023-06-05 NOTE — Addendum Note (Signed)
Addended by: Hoy Register on: 06/05/2023 10:36 AM   Modules accepted: Orders

## 2023-06-05 NOTE — Telephone Encounter (Signed)
Left message for pharmacy to call back. 

## 2023-06-06 NOTE — Telephone Encounter (Signed)
Pt has been called and informed that referral has been placed.

## 2023-06-06 NOTE — Telephone Encounter (Signed)
Noted  

## 2023-06-14 DIAGNOSIS — R338 Other retention of urine: Secondary | ICD-10-CM | POA: Diagnosis not present

## 2023-06-19 ENCOUNTER — Ambulatory Visit: Payer: Medicare HMO | Admitting: Physician Assistant

## 2023-06-20 ENCOUNTER — Telehealth: Payer: Self-pay

## 2023-06-20 ENCOUNTER — Other Ambulatory Visit (HOSPITAL_BASED_OUTPATIENT_CLINIC_OR_DEPARTMENT_OTHER): Payer: Self-pay

## 2023-06-20 ENCOUNTER — Ambulatory Visit: Payer: Medicare HMO | Attending: Physician Assistant | Admitting: Family Medicine

## 2023-06-20 VITALS — BP 124/70 | HR 70 | Ht 64.0 in | Wt 103.6 lb

## 2023-06-20 DIAGNOSIS — I2510 Atherosclerotic heart disease of native coronary artery without angina pectoris: Secondary | ICD-10-CM

## 2023-06-20 DIAGNOSIS — R339 Retention of urine, unspecified: Secondary | ICD-10-CM | POA: Diagnosis not present

## 2023-06-20 DIAGNOSIS — J9611 Chronic respiratory failure with hypoxia: Secondary | ICD-10-CM | POA: Diagnosis not present

## 2023-06-20 DIAGNOSIS — J449 Chronic obstructive pulmonary disease, unspecified: Secondary | ICD-10-CM

## 2023-06-20 DIAGNOSIS — Z23 Encounter for immunization: Secondary | ICD-10-CM | POA: Diagnosis not present

## 2023-06-20 DIAGNOSIS — J9612 Chronic respiratory failure with hypercapnia: Secondary | ICD-10-CM | POA: Diagnosis not present

## 2023-06-20 MED ORDER — INFLUENZA VAC A&B SURF ANT ADJ 0.5 ML IM SUSY
0.5000 mL | PREFILLED_SYRINGE | Freq: Once | INTRAMUSCULAR | 0 refills | Status: AC
  Filled 2023-06-20: qty 0.5, 1d supply, fill #0

## 2023-06-20 NOTE — Telephone Encounter (Signed)
PCS for faxed to [facility]

## 2023-06-20 NOTE — Progress Notes (Signed)
Subjective:  Patient ID: Margaret Henry, female    DOB: 28-Jun-1951  Age: 72 y.o. MRN: [id number]  CC: PCS FORM (Problem with emptying her bladder.)   HPI Margaret Henry is a 72 y.o. 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   Interval History: Discussed the use of AI scribe software for clinical note transcription with the patient, who gave verbal consent to proceed.  She was hospitalized from 05/21/2023 through 05/24/2023 at [facility] for sepsis and chronic respiratory failure.  During her hospital stay she developed acute urinary retention and was discharged with Foley catheter. She presents with urinary retention. She reports having had a catheter since her hospital stay, which was removed recently. The patient describes her urine flow as inconsistent and not steady. She also notes that her urine is clear, despite her intake of tea and coffee. The patient drinks two to three 10 fluid ounce bottles of water daily.  The patient's COPD is managed with Spiriva, which she takes daily. She also takes Claritin, Lipitor, erythromycin, Breo, and B12 daily or every other day.  She remains on chronic oxygen and is also under the care of pulmonary. The patient reports no recent changes in appetite but mentions waking up with lower abdominal pain, which resolved after urination. She has an upcoming appointment with urology but was informed she would need a PCP referral to undergo voiding trial.  The patient also mentions a need for assistance with personal care services, particularly for showering. She currently does not have patient care services.        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)   Anal intraepithelial neoplasia II (AIN  II) 2017   Anemia 04/06/22   Aortic atherosclerosis (HCC)    B12 deficiency    Blood transfusion without reported diagnosis 6/23   Bronchitis    Cancer (HCC)    skin cancer on chest   Cataract 5/23   Chest pain 06/12/2016   Cholelithiasis    Colon polyps    Complication of anesthesia 05/16/2021   pt states was given too much during nasal surgery 1989; difficulty getting awake   COPD (chronic obstructive pulmonary disease) (HCC)    Coronary artery calcification    Diverticulitis 05/16/2021   occasional   Diverticulosis    Dyspnea 06/27/2016   Emphysema of lung (HCC)    External hemorrhoids    GERD (gastroesophageal reflux disease)    occasional   Hemorrhoids    High cholesterol    Hyperlipidemia LDL goal <70    Iron deficiency    Mild aortic insufficiency    Neuropathy 04/07/2022   Osteoporosis    Oxygen deficiency    Pneumonia    Shortness of breath dyspnea    with exertion    Thyroid goiter    bx benign   Tobacco abuse    Vertigo 04/21/2017    Past Surgical History:  Procedure Laterality Date   ABDOMINAL HYSTERECTOMY     APPENDECTOMY  1973   BILATERAL OOPHORECTOMY  10/09/1999   for benign ovarian mass    BIOPSY THYROID     DENTAL SURGERY     dentures   NASAL SEPTUM SURGERY     PARTIAL HYSTERECTOMY  10/08/1974  for heavy menses    RECTAL EXAM UNDER ANESTHESIA N/A 12/14/2015   Procedure: RECTAL EXAM UNDER ANESTHESIA REMOVAL OF ANAL CANAL MASS; INTERNAL HEMORRHOID LIGATION, EXTERNAL HEMORRHOID LIGATION;  Surgeon: Karie Soda, MD;  Location: [facility];  Service: General;  Laterality: N/A;   TUBAL LIGATION     WISDOM TOOTH EXTRACTION      Family History  Problem Relation Age of Onset   Heart disease Mother    Alcohol abuse Mother    Asthma Mother    COPD Father    Heart disease Father    Lung cancer Maternal Grandfather    Cancer Maternal Grandfather    Liver cancer Paternal Grandmother    Cancer Paternal Grandmother    Diabetes Paternal Aunt    Colon  cancer Neg Hx    Colon polyps Neg Hx    Esophageal cancer Neg Hx    Rectal cancer Neg Hx    Stomach cancer Neg Hx     Social History   Socioeconomic History   Marital status: Widowed    Spouse name: Not on file   Number of children: Not on file   Years of education: Not on file   Highest education level: 9th grade  Occupational History   Not on file  Tobacco Use   Smoking status: Former    Current packs/day: 0.00    Average packs/day: 1.5 packs/day for 50.0 years (75.0 ttl pk-yrs)    Types: Cigarettes    Start date: 11/03/1968    Quit date: 11/03/2018    Years since quitting: 4.6   Smokeless tobacco: Never  Vaping Use   Vaping status: Never Used  Substance and Sexual Activity   Alcohol use: No   Drug use: No   Sexual activity: Never  Other Topics Concern   Not on file  Social History Narrative   Not on file   Social Determinants of Health   Financial Resource Strain: Low Risk  (03/05/2023)   Overall Financial Resource Strain (CARDIA)    Difficulty of Paying Living Expenses: Not hard at all  Food Insecurity: No Food Insecurity (05/21/2023)   Hunger Vital Sign    Worried About Running Out of Food in the Last Year: Never true    Ran Out of Food in the Last Year: Never true  Transportation Needs: No Transportation Needs (05/27/2023)   PRAPARE - Administrator, Civil Service (Medical): No    Lack of Transportation (Non-Medical): No  Physical Activity: Insufficiently Active (03/05/2023)   Exercise Vital Sign    Days of Exercise per Week: 3 days    Minutes of Exercise per Session: 30 min  Stress: No Stress Concern Present (03/05/2023)   [employer] of Occupational Health - Occupational Stress Questionnaire    Feeling of Stress : Not at all  Social Connections: Socially Isolated (03/05/2023)   Social Connection and Isolation Panel [NHANES]    Frequency of Communication with Friends and Family: More than three times a week    Frequency of Social Gatherings  with Friends and Family: More than three times a week    Attends Religious Services: Never    Database administrator or Organizations: No    Attends Banker Meetings: Never    Marital Status: Widowed    Allergies  Allergen Reactions   Hydrocodone Nausea And Vomiting   Propoxyphene N-Acetaminophen Nausea And Vomiting   Augmentin [Amoxicillin-Pot Clavulanate] Rash    Outpatient Medications Prior to Visit  Medication Sig Dispense  Refill   albuterol (PROVENTIL) (2.5 MG/3ML) 0.083% nebulizer solution Take 2.5 mg by nebulization every 6 (six) hours as needed for wheezing or shortness of breath.     atorvastatin (LIPITOR) 40 MG tablet TAKE 1 TABLET EVERY DAY 90 tablet 3   azithromycin (ZITHROMAX) 250 MG tablet Take 1 tablet (250 mg total) by mouth daily. (Patient taking differently: Take 250 mg by mouth daily. Taking every other day) 30 tablet 5   bismuth subsalicylate (PEPTO BISMOL) 262 MG/15ML suspension Take 30 mLs by mouth every 6 (six) hours as needed for indigestion.     BREO ELLIPTA 200-25 MCG/ACT AEPB INHALE 1 PUFF EVERY DAY 180 each 3   ferrous sulfate 325 (65 FE) MG EC tablet Take 325 mg by mouth every other day. Pt states she is only taking once a week due to constipation     loratadine (CLARITIN) 10 MG tablet Take 1 tablet (10 mg total) by mouth daily. 100 tablet 1   mupirocin ointment (BACTROBAN) 2 % Apply 1 Application topically 2 (two) times daily. To affected nostril (Patient taking differently: Place 1 Application into the nose See admin instructions. Apply to affected (inner) nostril(s) one to two times a day) 22 g 0   nitroGLYCERIN (NITROSTAT) 0.4 MG SL tablet Place 1 tablet (0.4 mg total) under the tongue every 5 (five) minutes as needed for chest pain. 25 tablet 6   OXYGEN Inhale 3 L/min into the lungs continuous.     SPIRIVA RESPIMAT 2.5 MCG/ACT AERS INHALE 2 PUFFS EVERY DAY 12 g 3   terbinafine (LAMISIL) 250 MG tablet Take 1 tablet (250 mg total) by mouth  daily. 90 tablet 0   vitamin B-12 (CYANOCOBALAMIN) 100 MCG tablet Take 100 mcg by mouth daily.     lidocaine (LIDODERM) 5 % Place 1 patch onto the skin daily. Remove & Discard patch within 12 hours or as directed by MD (Patient not taking: Reported on 05/27/2023) 30 patch 0   pantoprazole (PROTONIX) 20 MG tablet Take 1 tablet (20 mg total) by mouth daily. (Patient not taking: Reported on 06/20/2023) 90 tablet 2   predniSONE (DELTASONE) 10 MG tablet Take 1 tablet (10 mg total) by mouth daily with breakfast. (Patient not taking: Reported on 05/27/2023) 30 tablet 1   No facility-administered medications prior to visit.     ROS Review of Systems  Constitutional:  Negative for activity change and appetite change.  HENT:  Negative for sinus pressure and sore throat.   Respiratory:  Negative for chest tightness, shortness of breath and wheezing.   Cardiovascular:  Negative for chest pain and palpitations.  Gastrointestinal:  Negative for abdominal distention, abdominal pain and constipation.  Genitourinary: Negative.   Musculoskeletal: Negative.   Psychiatric/Behavioral:  Negative for behavioral problems and dysphoric mood.     Objective:  BP 124/70   Pulse 70   Ht 5\' 4"  (1.626 m)   Wt 103 lb 9.6 oz (47 kg)   SpO2 95%   BMI 17.78 kg/m      06/20/2023    9:41 AM 06/20/2023    8:56 AM 05/29/2023   12:18 PM  BP/Weight  Systolic BP 124 147 158  Diastolic BP 70 70 62  Wt. (Lbs)  103.6   BMI  17.78 kg/m2       Physical Exam Constitutional:      Appearance: She is well-developed.  HENT:     Nose:     Comments: On 2 L of oxygen via nasal cannula Cardiovascular:  Rate and Rhythm: Normal rate.     Heart sounds: Normal heart sounds. No murmur heard. Pulmonary:     Effort: Pulmonary effort is normal.     Breath sounds: Normal breath sounds. No wheezing or rales.  Chest:     Chest wall: No tenderness.  Abdominal:     General: Bowel sounds are normal. There is no distension.      Palpations: Abdomen is soft. There is no mass.     Tenderness: There is no abdominal tenderness.  Musculoskeletal:        General: Normal range of motion.     Right lower leg: No edema.     Left lower leg: No edema.  Neurological:     Mental Status: She is alert and oriented to person, place, and time.  Psychiatric:        Mood and Affect: Mood normal.        Latest Ref Rng & Units 05/29/2023   11:28 AM 05/22/2023    4:24 AM 05/21/2023    4:16 PM  CMP  Glucose 70 - 99 mg/dL 89  540  981   BUN 8 - 23 mg/dL 11  7  11    Creatinine 0.44 - 1.00 mg/dL 1.91  4.78  2.95   Sodium 135 - 145 mmol/L 140  139  138   Potassium 3.5 - 5.1 mmol/L 3.8  3.9  3.4   Chloride 98 - 111 mmol/L 103  107  103   CO2 22 - 32 mmol/L 29  24  25    Calcium 8.9 - 10.3 mg/dL 8.8  8.9  9.2   Total Protein 6.5 - 8.1 g/dL  6.4  8.0   Total Bilirubin 0.3 - 1.2 mg/dL  0.6  0.8   Alkaline Phos 38 - 126 U/L  56  72   AST 15 - 41 U/L  17  20   ALT 0 - 44 U/L  10  13     Lipid Panel     Component Value Date/Time   CHOL 143 01/30/2022 1158   CHOL 147 02/15/2021 1139   TRIG 132.0 01/30/2022 1158   HDL 74.30 01/30/2022 1158   HDL 58 02/15/2021 1139   CHOLHDL 2 01/30/2022 1158   VLDL 26.4 01/30/2022 1158   LDLCALC 42 01/30/2022 1158   LDLCALC 70 02/15/2021 1139   LDLDIRECT 166 (H) 09/26/2015 1001    CBC    Component Value Date/Time   WBC 11.1 (H) 05/23/2023 1216   RBC 3.85 (L) 05/23/2023 1216   HGB 11.3 (L) 05/23/2023 1216   HGB 12.1 01/03/2023 1352   HCT 36.6 05/23/2023 1216   HCT 37.0 01/03/2023 1352   PLT 210 05/23/2023 1216   PLT 271 01/03/2023 1352   MCV 95.1 05/23/2023 1216   MCV 97 01/03/2023 1352   MCH 29.4 05/23/2023 1216   MCHC 30.9 05/23/2023 1216   RDW 14.0 05/23/2023 1216   RDW 13.1 01/03/2023 1352   LYMPHSABS 1.1 04/22/2023 1135   LYMPHSABS 1.3 01/03/2023 1352   MONOABS 0.9 04/22/2023 1135   EOSABS 0.3 04/22/2023 1135   EOSABS 0.3 01/03/2023 1352   BASOSABS 0.1 04/22/2023 1135    BASOSABS 0.1 01/03/2023 1352    Lab Results  Component Value Date   HGBA1C 5.5 07/04/2022    Assessment & Plan:      Urinary Retention Recent history of urinary retention requiring catheterization. Currently experiencing intermittent flow and clear urine. Possible anticholinergic side effect of Spiriva for COPD management  vs pelvic floor etiology. Upcoming urology appointment for cystoscopy. -Referral placed for urology consultation and cystoscopy.  COPD/chronic respiratory failure -Continue Spiriva as it is essential for COPD management. Patient requires assistance with grooming and showering. -Complete necessary form for insurance coverage of home care services based on this visit.  Coronary arteriosclerosis Continue statin Lipid panel today   Immunizations Last pneumonia vaccine received in 2019. Flu shot due. -Administer Pneumonia 20 and flu shot today.  Elevated blood pressure Initial blood pressure reading elevated, but repeat reading within normal range. -No changes to current management.          No orders of the defined types were placed in this encounter.   Follow-up: Return in about 6 months (around 12/18/2023) for Chronic medical conditions.       Hoy Register, MD, FAAFP. [facility] [place], [place] [phone]   06/20/2023, 12:52 PM

## 2023-06-20 NOTE — Patient Instructions (Signed)
Acute Urinary Retention, Female  Acute urinary retention is a condition in which a person is unable to pass urine or can only pass a little urine. This condition can happen suddenly and last for a short time. If left untreated, it can become long-term (chronic) and result in kidney damage or other serious complications. What are the causes? This condition may be caused by: Obstruction or narrowing of the tube that drains the bladder (urethra). This may be caused by surgery, problems with nearby organs, or injury to the bladder or urethra. Problems with the nerves in the bladder. Pelvic organ prolapse. Tumors in the area of the pelvis, bladder, or urethra. Vaginal childbirth. Bladder or urinary tract infection. Constipation. Certain medicines. What increases the risk? This condition is more likely to develop in women over age 73. Other chronic health conditions can increase the risk of acute urinary retention. These include: Diseases such as multiple sclerosis. Spinal cord injuries. Diabetes. Degenerative cognitive conditions, such as delirium or dementia. Psychological conditions. A woman may hold her urine due to trauma or because she does not want to use the bathroom. History of preexisting urinary retention. History of prior pelvic surgery, incontinence surgery, or radical pelvic surgery. What are the signs or symptoms? Symptoms of this condition include: Trouble urinating. Pain in the lower abdomen. How is this diagnosed? This condition is diagnosed based on a physical exam and your medical history. You may also have other tests, including: An ultrasound of the bladder or kidneys or both. Blood tests. A urine analysis. Additional tests may be needed, such as a CT scan, MRI, and kidney or bladder function tests. How is this treated? Treatment for this condition may include: Medicines. Placing a thin, sterile tube (catheter) into the bladder to drain urine out of the body. This  is called an indwelling urinary catheter. After it is inserted, the catheter is held in place with a small balloon that is filled with sterile water. Urine drains from the catheter into a collection bag outside of the body. Behavioral therapy. Treatment for other conditions. If needed, you may be treated in the hospital for kidney function problems or to manage other complications. Follow these instructions at home: Medicines Take over-the-counter and prescription medicines only as told by your health care provider. Avoid certain medicines, such as decongestants, antihistamines, and some prescription medicines. Do not take any medicine unless your health care provider approves. If you were prescribed an antibiotic medicine, take it as told by your health care provider. Do not stop using the antibiotic even if you start to feel better. General instructions Do not use any products that contain nicotine or tobacco. These products include cigarettes, chewing tobacco, and vaping devices, such as e-cigarettes. If you need help quitting, ask your health care provider. Drink enough fluid to keep your urine pale yellow. If you have an indwelling urinary catheter, follow the instructions from your health care provider. Monitor any changes in your symptoms. Tell your health care provider about any changes. If instructed, monitor your blood pressure at home. Report changes as told by your health care provider. Keep all follow-up visits. This is important. Contact a health care provider if: You have uncomfortable bladder contractions that you cannot control (spasms). You leak urine with the spasms. Get help right away if: You have chills or a fever. You have blood in your urine. You have a catheter and the following happens: Your catheter stops draining urine. Your catheter falls out. Summary Acute urinary retention is a  condition in which a person is unable to pass urine or can only pass a little urine.  If left untreated, this can result in kidney damage or other serious complications. One cause of this condition may be obstruction or narrowing of the tube that drains the bladder (urethra). This may be caused by surgery, problems with nearby organs, or injury to the bladder or urethra. Treatment may include medicines and placement of an indwelling urinary catheter. Monitor any changes in your symptoms. Tell your health care provider about any changes. This information is not intended to replace advice given to you by your health care provider. Make sure you discuss any questions you have with your health care provider. Document Revised: 06/15/2020 Document Reviewed: 06/15/2020 Elsevier Patient Education  2024 [employer].

## 2023-07-10 ENCOUNTER — Ambulatory Visit: Payer: Medicare HMO | Admitting: Orthopedic Surgery

## 2023-07-15 ENCOUNTER — Ambulatory Visit (INDEPENDENT_AMBULATORY_CARE_PROVIDER_SITE_OTHER): Payer: Medicare HMO | Admitting: Orthopedic Surgery

## 2023-07-15 ENCOUNTER — Other Ambulatory Visit (INDEPENDENT_AMBULATORY_CARE_PROVIDER_SITE_OTHER): Payer: Self-pay

## 2023-07-15 ENCOUNTER — Other Ambulatory Visit (HOSPITAL_BASED_OUTPATIENT_CLINIC_OR_DEPARTMENT_OTHER): Payer: Self-pay

## 2023-07-15 ENCOUNTER — Telehealth: Payer: Self-pay | Admitting: Family Medicine

## 2023-07-15 DIAGNOSIS — G8929 Other chronic pain: Secondary | ICD-10-CM

## 2023-07-15 DIAGNOSIS — M545 Low back pain, unspecified: Secondary | ICD-10-CM

## 2023-07-15 DIAGNOSIS — R339 Retention of urine, unspecified: Secondary | ICD-10-CM

## 2023-07-15 DIAGNOSIS — R2689 Other abnormalities of gait and mobility: Secondary | ICD-10-CM

## 2023-07-15 DIAGNOSIS — S32030G Wedge compression fracture of third lumbar vertebra, subsequent encounter for fracture with delayed healing: Secondary | ICD-10-CM

## 2023-07-15 DIAGNOSIS — J9611 Chronic respiratory failure with hypoxia: Secondary | ICD-10-CM

## 2023-07-15 MED ORDER — TRAMADOL HCL 50 MG PO TABS
50.0000 mg | ORAL_TABLET | Freq: Four times a day (QID) | ORAL | 0 refills | Status: DC | PRN
  Filled 2023-07-15: qty 20, 5d supply, fill #0

## 2023-07-15 NOTE — Telephone Encounter (Signed)
Pt is calling to ask Dr. Alvis Lemmings if she can place an order for [facility] - Please advise  CB- [phone]

## 2023-07-15 NOTE — Addendum Note (Signed)
Addended by: Hoy Register on: 07/15/2023 06:04 PM   Modules accepted: Orders

## 2023-07-15 NOTE — Progress Notes (Signed)
Orthopedic Spine Surgery Office Note   Assessment: Patient is a 72 y.o. female with back pain and multiple compression fractures. Back pian has improved significantly     Plan: -No acute operative intervention -Out of bed as tolerated -Activity as tolerated -Prescribed tramadol today for use sparingly -Continue with osteoporosis treatment with PCP. Explained the importance of continued treatment  -Follow up on an as needed basis     Patient expressed understanding of the plan and all questions were answered to the patient's satisfaction.    ___________________________________________________________________________   History: Patient is a 72 y.o. female who has been previously seen in the office for low back which was consistent with compression fractures.  Patient now mostly having mild pain on a daily basis.  She does have periodic days where her pain is more significant.  She is taking tramadol rarely.  She notes pain is often worse if she was particularly active.  She is not having any pain radiating into either lower extremity.  Pain is much better than at our first couple of visits.  Has had a long history of urinary incontinence but no recent changes in bowel or bladder habits.  No saddle anesthesia.   Previous treatments: Tramadol, Tylenol, activity modification, topical medication, toradol injection, TLSO brace, PT    Physical Exam:   General: no acute distress, appears stated age Neurologic: alert, answering questions appropriately, following commands Respiratory: unlabored breathing on room air, symmetric chest rise Psychiatric: appropriate affect, normal cadence to speech     MSK (spine):   -Strength exam                                                   Left                  Right EHL                              5/5                  5/5 TA                                 5/5                  5/5 GSC                             5/5                  5/5 Knee  extension            5/5                  5/5 Hip flexion                    5/5                  5/5   -Sensory exam                           Sensation intact to light touch in L3-S1 nerve distributions of bilateral lower  extremities   -Negative SLR bilaterally -Clonus: no beats bilaterally   Imaging: XRs of the lumbar spine from 07/15/2023 were independently reviewed and interpreted, showing L1, L3, and L5 compression fractures that appear similar to films on 01/07/2023.  No significant change in anterior height loss.  No new fracture seen.    Patient name: Margaret Henry Patient MRN: [id number] Date of visit: 07/15/23

## 2023-07-15 NOTE — Telephone Encounter (Signed)
Done

## 2023-07-15 NOTE — Telephone Encounter (Signed)
Spoke with patient. Patient reports that she does not feel very steady on her feet and she has requested HH PT or OT  with [facility].  Please advise

## 2023-07-16 NOTE — Telephone Encounter (Signed)
Documentation faxed [facility] . Patient is aware.

## 2023-07-17 DIAGNOSIS — J9612 Chronic respiratory failure with hypercapnia: Secondary | ICD-10-CM | POA: Diagnosis not present

## 2023-07-17 DIAGNOSIS — E7800 Pure hypercholesterolemia, unspecified: Secondary | ICD-10-CM | POA: Diagnosis not present

## 2023-07-17 DIAGNOSIS — I2510 Atherosclerotic heart disease of native coronary artery without angina pectoris: Secondary | ICD-10-CM | POA: Diagnosis not present

## 2023-07-17 DIAGNOSIS — D649 Anemia, unspecified: Secondary | ICD-10-CM | POA: Diagnosis not present

## 2023-07-17 DIAGNOSIS — J439 Emphysema, unspecified: Secondary | ICD-10-CM | POA: Diagnosis not present

## 2023-07-17 DIAGNOSIS — R339 Retention of urine, unspecified: Secondary | ICD-10-CM | POA: Diagnosis not present

## 2023-07-17 DIAGNOSIS — K219 Gastro-esophageal reflux disease without esophagitis: Secondary | ICD-10-CM | POA: Diagnosis not present

## 2023-07-17 DIAGNOSIS — J9611 Chronic respiratory failure with hypoxia: Secondary | ICD-10-CM | POA: Diagnosis not present

## 2023-07-17 DIAGNOSIS — I700 Atherosclerosis of aorta: Secondary | ICD-10-CM | POA: Diagnosis not present

## 2023-07-18 ENCOUNTER — Telehealth: Payer: Self-pay | Admitting: Family Medicine

## 2023-07-18 ENCOUNTER — Telehealth: Payer: Self-pay

## 2023-07-18 NOTE — Telephone Encounter (Signed)
Call to [facility] PT with [facility]   To gave verbal order for    Home Health PT OT Evaluation Home Health Aide to assist with bathing   Frequency:  PT: 1 time a week for 1 week 2 times a week for 4 weeks 1 time a week for 4 weeks  Unable reach VM left

## 2023-07-18 NOTE — Telephone Encounter (Signed)
Spoke with patient about evaluation for PCS services. Patient reported the She has already been evaluated. She is just waiting for someone to get back with her.

## 2023-07-18 NOTE — Telephone Encounter (Signed)
Home Health Verbal Orders - Caller/Agency:  Gracie PT with [facility]   Callback Number: [phone] (VM SECURED)   Requesting Home Health PT Requesting OT Evaluation Requesting Home Health Aide to assist with bathing  Frequency:  PT: 1 time a week for 1 week 2 times a week for 4 weeks 1 time a week for 4 weeks

## 2023-07-23 DIAGNOSIS — J9611 Chronic respiratory failure with hypoxia: Secondary | ICD-10-CM | POA: Diagnosis not present

## 2023-07-23 DIAGNOSIS — K219 Gastro-esophageal reflux disease without esophagitis: Secondary | ICD-10-CM | POA: Diagnosis not present

## 2023-07-23 DIAGNOSIS — E7800 Pure hypercholesterolemia, unspecified: Secondary | ICD-10-CM | POA: Diagnosis not present

## 2023-07-23 DIAGNOSIS — R339 Retention of urine, unspecified: Secondary | ICD-10-CM | POA: Diagnosis not present

## 2023-07-23 DIAGNOSIS — J439 Emphysema, unspecified: Secondary | ICD-10-CM | POA: Diagnosis not present

## 2023-07-23 DIAGNOSIS — I2510 Atherosclerotic heart disease of native coronary artery without angina pectoris: Secondary | ICD-10-CM | POA: Diagnosis not present

## 2023-07-23 DIAGNOSIS — D649 Anemia, unspecified: Secondary | ICD-10-CM | POA: Diagnosis not present

## 2023-07-23 DIAGNOSIS — J9612 Chronic respiratory failure with hypercapnia: Secondary | ICD-10-CM | POA: Diagnosis not present

## 2023-07-23 DIAGNOSIS — I700 Atherosclerosis of aorta: Secondary | ICD-10-CM | POA: Diagnosis not present

## 2023-07-24 ENCOUNTER — Telehealth: Payer: Self-pay | Admitting: Pulmonary Disease

## 2023-07-24 DIAGNOSIS — I2510 Atherosclerotic heart disease of native coronary artery without angina pectoris: Secondary | ICD-10-CM | POA: Diagnosis not present

## 2023-07-24 DIAGNOSIS — E7800 Pure hypercholesterolemia, unspecified: Secondary | ICD-10-CM | POA: Diagnosis not present

## 2023-07-24 DIAGNOSIS — R339 Retention of urine, unspecified: Secondary | ICD-10-CM | POA: Diagnosis not present

## 2023-07-24 DIAGNOSIS — J439 Emphysema, unspecified: Secondary | ICD-10-CM | POA: Diagnosis not present

## 2023-07-24 DIAGNOSIS — K219 Gastro-esophageal reflux disease without esophagitis: Secondary | ICD-10-CM | POA: Diagnosis not present

## 2023-07-24 DIAGNOSIS — D649 Anemia, unspecified: Secondary | ICD-10-CM | POA: Diagnosis not present

## 2023-07-24 DIAGNOSIS — I700 Atherosclerosis of aorta: Secondary | ICD-10-CM | POA: Diagnosis not present

## 2023-07-24 DIAGNOSIS — J9612 Chronic respiratory failure with hypercapnia: Secondary | ICD-10-CM | POA: Diagnosis not present

## 2023-07-24 DIAGNOSIS — J9611 Chronic respiratory failure with hypoxia: Secondary | ICD-10-CM | POA: Diagnosis not present

## 2023-07-24 NOTE — Telephone Encounter (Signed)
[facility] calling requesting a reason for the pt taking azithromycin

## 2023-07-25 DIAGNOSIS — K219 Gastro-esophageal reflux disease without esophagitis: Secondary | ICD-10-CM | POA: Diagnosis not present

## 2023-07-25 DIAGNOSIS — I700 Atherosclerosis of aorta: Secondary | ICD-10-CM | POA: Diagnosis not present

## 2023-07-25 DIAGNOSIS — J9611 Chronic respiratory failure with hypoxia: Secondary | ICD-10-CM | POA: Diagnosis not present

## 2023-07-25 DIAGNOSIS — J9612 Chronic respiratory failure with hypercapnia: Secondary | ICD-10-CM | POA: Diagnosis not present

## 2023-07-25 DIAGNOSIS — R339 Retention of urine, unspecified: Secondary | ICD-10-CM | POA: Diagnosis not present

## 2023-07-25 DIAGNOSIS — J439 Emphysema, unspecified: Secondary | ICD-10-CM | POA: Diagnosis not present

## 2023-07-25 DIAGNOSIS — D649 Anemia, unspecified: Secondary | ICD-10-CM | POA: Diagnosis not present

## 2023-07-25 DIAGNOSIS — I2510 Atherosclerotic heart disease of native coronary artery without angina pectoris: Secondary | ICD-10-CM | POA: Diagnosis not present

## 2023-07-25 DIAGNOSIS — E7800 Pure hypercholesterolemia, unspecified: Secondary | ICD-10-CM | POA: Diagnosis not present

## 2023-07-25 NOTE — Telephone Encounter (Signed)
[facility] home health about azithromycin daily.  I did leave detailed message on reasons pulmonary patients are on daily azithromycin. I left call back number to call for any further questions.

## 2023-07-30 DIAGNOSIS — I2510 Atherosclerotic heart disease of native coronary artery without angina pectoris: Secondary | ICD-10-CM | POA: Diagnosis not present

## 2023-07-30 DIAGNOSIS — R339 Retention of urine, unspecified: Secondary | ICD-10-CM | POA: Diagnosis not present

## 2023-07-30 DIAGNOSIS — D649 Anemia, unspecified: Secondary | ICD-10-CM | POA: Diagnosis not present

## 2023-07-30 DIAGNOSIS — E7800 Pure hypercholesterolemia, unspecified: Secondary | ICD-10-CM | POA: Diagnosis not present

## 2023-07-30 DIAGNOSIS — K219 Gastro-esophageal reflux disease without esophagitis: Secondary | ICD-10-CM | POA: Diagnosis not present

## 2023-07-30 DIAGNOSIS — I700 Atherosclerosis of aorta: Secondary | ICD-10-CM | POA: Diagnosis not present

## 2023-07-30 DIAGNOSIS — J9611 Chronic respiratory failure with hypoxia: Secondary | ICD-10-CM | POA: Diagnosis not present

## 2023-07-30 DIAGNOSIS — J439 Emphysema, unspecified: Secondary | ICD-10-CM | POA: Diagnosis not present

## 2023-07-30 DIAGNOSIS — J9612 Chronic respiratory failure with hypercapnia: Secondary | ICD-10-CM | POA: Diagnosis not present

## 2023-08-02 DIAGNOSIS — J9612 Chronic respiratory failure with hypercapnia: Secondary | ICD-10-CM | POA: Diagnosis not present

## 2023-08-02 DIAGNOSIS — I2510 Atherosclerotic heart disease of native coronary artery without angina pectoris: Secondary | ICD-10-CM | POA: Diagnosis not present

## 2023-08-02 DIAGNOSIS — K219 Gastro-esophageal reflux disease without esophagitis: Secondary | ICD-10-CM | POA: Diagnosis not present

## 2023-08-02 DIAGNOSIS — D649 Anemia, unspecified: Secondary | ICD-10-CM | POA: Diagnosis not present

## 2023-08-02 DIAGNOSIS — R339 Retention of urine, unspecified: Secondary | ICD-10-CM | POA: Diagnosis not present

## 2023-08-02 DIAGNOSIS — E7800 Pure hypercholesterolemia, unspecified: Secondary | ICD-10-CM | POA: Diagnosis not present

## 2023-08-02 DIAGNOSIS — I700 Atherosclerosis of aorta: Secondary | ICD-10-CM | POA: Diagnosis not present

## 2023-08-02 DIAGNOSIS — J9611 Chronic respiratory failure with hypoxia: Secondary | ICD-10-CM | POA: Diagnosis not present

## 2023-08-02 DIAGNOSIS — J439 Emphysema, unspecified: Secondary | ICD-10-CM | POA: Diagnosis not present

## 2023-08-06 DIAGNOSIS — E7800 Pure hypercholesterolemia, unspecified: Secondary | ICD-10-CM | POA: Diagnosis not present

## 2023-08-06 DIAGNOSIS — R339 Retention of urine, unspecified: Secondary | ICD-10-CM | POA: Diagnosis not present

## 2023-08-06 DIAGNOSIS — I700 Atherosclerosis of aorta: Secondary | ICD-10-CM | POA: Diagnosis not present

## 2023-08-06 DIAGNOSIS — K219 Gastro-esophageal reflux disease without esophagitis: Secondary | ICD-10-CM | POA: Diagnosis not present

## 2023-08-06 DIAGNOSIS — D649 Anemia, unspecified: Secondary | ICD-10-CM | POA: Diagnosis not present

## 2023-08-06 DIAGNOSIS — J9611 Chronic respiratory failure with hypoxia: Secondary | ICD-10-CM | POA: Diagnosis not present

## 2023-08-06 DIAGNOSIS — J439 Emphysema, unspecified: Secondary | ICD-10-CM | POA: Diagnosis not present

## 2023-08-06 DIAGNOSIS — J9612 Chronic respiratory failure with hypercapnia: Secondary | ICD-10-CM | POA: Diagnosis not present

## 2023-08-06 DIAGNOSIS — I2510 Atherosclerotic heart disease of native coronary artery without angina pectoris: Secondary | ICD-10-CM | POA: Diagnosis not present

## 2023-08-07 DIAGNOSIS — R339 Retention of urine, unspecified: Secondary | ICD-10-CM | POA: Diagnosis not present

## 2023-08-07 DIAGNOSIS — J439 Emphysema, unspecified: Secondary | ICD-10-CM | POA: Diagnosis not present

## 2023-08-07 DIAGNOSIS — D649 Anemia, unspecified: Secondary | ICD-10-CM | POA: Diagnosis not present

## 2023-08-07 DIAGNOSIS — J9611 Chronic respiratory failure with hypoxia: Secondary | ICD-10-CM | POA: Diagnosis not present

## 2023-08-07 DIAGNOSIS — E7800 Pure hypercholesterolemia, unspecified: Secondary | ICD-10-CM | POA: Diagnosis not present

## 2023-08-07 DIAGNOSIS — K219 Gastro-esophageal reflux disease without esophagitis: Secondary | ICD-10-CM | POA: Diagnosis not present

## 2023-08-07 DIAGNOSIS — I700 Atherosclerosis of aorta: Secondary | ICD-10-CM | POA: Diagnosis not present

## 2023-08-07 DIAGNOSIS — J9612 Chronic respiratory failure with hypercapnia: Secondary | ICD-10-CM | POA: Diagnosis not present

## 2023-08-07 DIAGNOSIS — I2510 Atherosclerotic heart disease of native coronary artery without angina pectoris: Secondary | ICD-10-CM | POA: Diagnosis not present

## 2023-08-12 ENCOUNTER — Ambulatory Visit: Payer: Medicare HMO | Admitting: Family Medicine

## 2023-08-13 DIAGNOSIS — I700 Atherosclerosis of aorta: Secondary | ICD-10-CM | POA: Diagnosis not present

## 2023-08-13 DIAGNOSIS — J439 Emphysema, unspecified: Secondary | ICD-10-CM | POA: Diagnosis not present

## 2023-08-13 DIAGNOSIS — J9612 Chronic respiratory failure with hypercapnia: Secondary | ICD-10-CM | POA: Diagnosis not present

## 2023-08-13 DIAGNOSIS — J9611 Chronic respiratory failure with hypoxia: Secondary | ICD-10-CM | POA: Diagnosis not present

## 2023-08-13 DIAGNOSIS — I2510 Atherosclerotic heart disease of native coronary artery without angina pectoris: Secondary | ICD-10-CM | POA: Diagnosis not present

## 2023-08-13 DIAGNOSIS — D649 Anemia, unspecified: Secondary | ICD-10-CM | POA: Diagnosis not present

## 2023-08-13 DIAGNOSIS — R339 Retention of urine, unspecified: Secondary | ICD-10-CM | POA: Diagnosis not present

## 2023-08-13 DIAGNOSIS — E7800 Pure hypercholesterolemia, unspecified: Secondary | ICD-10-CM | POA: Diagnosis not present

## 2023-08-13 DIAGNOSIS — K219 Gastro-esophageal reflux disease without esophagitis: Secondary | ICD-10-CM | POA: Diagnosis not present

## 2023-08-14 DIAGNOSIS — D649 Anemia, unspecified: Secondary | ICD-10-CM | POA: Diagnosis not present

## 2023-08-14 DIAGNOSIS — I700 Atherosclerosis of aorta: Secondary | ICD-10-CM | POA: Diagnosis not present

## 2023-08-14 DIAGNOSIS — R339 Retention of urine, unspecified: Secondary | ICD-10-CM | POA: Diagnosis not present

## 2023-08-14 DIAGNOSIS — K219 Gastro-esophageal reflux disease without esophagitis: Secondary | ICD-10-CM | POA: Diagnosis not present

## 2023-08-14 DIAGNOSIS — I2510 Atherosclerotic heart disease of native coronary artery without angina pectoris: Secondary | ICD-10-CM | POA: Diagnosis not present

## 2023-08-14 DIAGNOSIS — J9612 Chronic respiratory failure with hypercapnia: Secondary | ICD-10-CM | POA: Diagnosis not present

## 2023-08-14 DIAGNOSIS — E7800 Pure hypercholesterolemia, unspecified: Secondary | ICD-10-CM | POA: Diagnosis not present

## 2023-08-14 DIAGNOSIS — J439 Emphysema, unspecified: Secondary | ICD-10-CM | POA: Diagnosis not present

## 2023-08-14 DIAGNOSIS — J9611 Chronic respiratory failure with hypoxia: Secondary | ICD-10-CM | POA: Diagnosis not present

## 2023-08-16 DIAGNOSIS — D649 Anemia, unspecified: Secondary | ICD-10-CM | POA: Diagnosis not present

## 2023-08-16 DIAGNOSIS — E7800 Pure hypercholesterolemia, unspecified: Secondary | ICD-10-CM | POA: Diagnosis not present

## 2023-08-16 DIAGNOSIS — R339 Retention of urine, unspecified: Secondary | ICD-10-CM | POA: Diagnosis not present

## 2023-08-16 DIAGNOSIS — I700 Atherosclerosis of aorta: Secondary | ICD-10-CM | POA: Diagnosis not present

## 2023-08-16 DIAGNOSIS — I2510 Atherosclerotic heart disease of native coronary artery without angina pectoris: Secondary | ICD-10-CM | POA: Diagnosis not present

## 2023-08-16 DIAGNOSIS — K219 Gastro-esophageal reflux disease without esophagitis: Secondary | ICD-10-CM | POA: Diagnosis not present

## 2023-08-16 DIAGNOSIS — J439 Emphysema, unspecified: Secondary | ICD-10-CM | POA: Diagnosis not present

## 2023-08-16 DIAGNOSIS — J9612 Chronic respiratory failure with hypercapnia: Secondary | ICD-10-CM | POA: Diagnosis not present

## 2023-08-16 DIAGNOSIS — J9611 Chronic respiratory failure with hypoxia: Secondary | ICD-10-CM | POA: Diagnosis not present

## 2023-08-19 ENCOUNTER — Other Ambulatory Visit: Payer: Self-pay | Admitting: Family Medicine

## 2023-08-19 DIAGNOSIS — E7800 Pure hypercholesterolemia, unspecified: Secondary | ICD-10-CM | POA: Diagnosis not present

## 2023-08-19 DIAGNOSIS — K219 Gastro-esophageal reflux disease without esophagitis: Secondary | ICD-10-CM | POA: Diagnosis not present

## 2023-08-19 DIAGNOSIS — D649 Anemia, unspecified: Secondary | ICD-10-CM | POA: Diagnosis not present

## 2023-08-19 DIAGNOSIS — J9612 Chronic respiratory failure with hypercapnia: Secondary | ICD-10-CM | POA: Diagnosis not present

## 2023-08-19 DIAGNOSIS — R339 Retention of urine, unspecified: Secondary | ICD-10-CM | POA: Diagnosis not present

## 2023-08-19 DIAGNOSIS — J3089 Other allergic rhinitis: Secondary | ICD-10-CM

## 2023-08-19 DIAGNOSIS — J439 Emphysema, unspecified: Secondary | ICD-10-CM | POA: Diagnosis not present

## 2023-08-19 DIAGNOSIS — I700 Atherosclerosis of aorta: Secondary | ICD-10-CM | POA: Diagnosis not present

## 2023-08-19 DIAGNOSIS — I2510 Atherosclerotic heart disease of native coronary artery without angina pectoris: Secondary | ICD-10-CM | POA: Diagnosis not present

## 2023-08-19 DIAGNOSIS — J9611 Chronic respiratory failure with hypoxia: Secondary | ICD-10-CM | POA: Diagnosis not present

## 2023-08-20 ENCOUNTER — Other Ambulatory Visit (HOSPITAL_BASED_OUTPATIENT_CLINIC_OR_DEPARTMENT_OTHER): Payer: Self-pay

## 2023-08-20 MED ORDER — LORATADINE 10 MG PO TABS
10.0000 mg | ORAL_TABLET | Freq: Every day | ORAL | 1 refills | Status: DC
  Filled 2023-08-20: qty 100, 100d supply, fill #0

## 2023-08-20 NOTE — Telephone Encounter (Signed)
Requested Prescriptions  Pending Prescriptions Disp Refills   loratadine (CLARITIN) 10 MG tablet 100 tablet 1    Sig: Take 1 tablet (10 mg total) by mouth daily.     Ear, Nose, and Throat:  Antihistamines 2 Passed - 08/19/2023 10:40 AM      Passed - Cr in normal range and within 360 days    Creat  Date Value Ref Range Status  09/05/2016 0.60 0.50 - 0.99 mg/dL Final    Comment:      For patients > or = 73 years of age: The upper reference limit for Creatinine is approximately 13% higher for people identified as African-American.      Creatinine, Ser  Date Value Ref Range Status  05/29/2023 0.47 0.44 - 1.00 mg/dL Final         Passed - Valid encounter within last 12 months    Recent Outpatient Visits           2 months ago Urinary retention   [facility] [place]. [facility] Hoy Register, MD   6 months ago Paronychia of finger of right hand   [facility] [place]. [facility] Hoy Register, MD   7 months ago Muscle cramps   [facility] [place]. [facility] Hoy Register, MD   10 months ago Lumbar spine pain   [facility] [place]. [facility] Hoy Register, MD   1 year ago Lumbar spine pain   [facility] [place]. [facility] Hoy Register, MD       Future Appointments             In 4 months Hoy Register, MD [facility] - [facility] Dept Of [place]. [place]

## 2023-08-21 DIAGNOSIS — D649 Anemia, unspecified: Secondary | ICD-10-CM | POA: Diagnosis not present

## 2023-08-21 DIAGNOSIS — E7800 Pure hypercholesterolemia, unspecified: Secondary | ICD-10-CM | POA: Diagnosis not present

## 2023-08-21 DIAGNOSIS — I2510 Atherosclerotic heart disease of native coronary artery without angina pectoris: Secondary | ICD-10-CM | POA: Diagnosis not present

## 2023-08-21 DIAGNOSIS — J439 Emphysema, unspecified: Secondary | ICD-10-CM | POA: Diagnosis not present

## 2023-08-21 DIAGNOSIS — I700 Atherosclerosis of aorta: Secondary | ICD-10-CM | POA: Diagnosis not present

## 2023-08-21 DIAGNOSIS — J9611 Chronic respiratory failure with hypoxia: Secondary | ICD-10-CM | POA: Diagnosis not present

## 2023-08-21 DIAGNOSIS — R339 Retention of urine, unspecified: Secondary | ICD-10-CM | POA: Diagnosis not present

## 2023-08-21 DIAGNOSIS — K219 Gastro-esophageal reflux disease without esophagitis: Secondary | ICD-10-CM | POA: Diagnosis not present

## 2023-08-21 DIAGNOSIS — J9612 Chronic respiratory failure with hypercapnia: Secondary | ICD-10-CM | POA: Diagnosis not present

## 2023-08-27 DIAGNOSIS — J439 Emphysema, unspecified: Secondary | ICD-10-CM | POA: Diagnosis not present

## 2023-08-27 DIAGNOSIS — J9612 Chronic respiratory failure with hypercapnia: Secondary | ICD-10-CM | POA: Diagnosis not present

## 2023-08-27 DIAGNOSIS — K219 Gastro-esophageal reflux disease without esophagitis: Secondary | ICD-10-CM | POA: Diagnosis not present

## 2023-08-27 DIAGNOSIS — I2510 Atherosclerotic heart disease of native coronary artery without angina pectoris: Secondary | ICD-10-CM | POA: Diagnosis not present

## 2023-08-27 DIAGNOSIS — D649 Anemia, unspecified: Secondary | ICD-10-CM | POA: Diagnosis not present

## 2023-08-27 DIAGNOSIS — R339 Retention of urine, unspecified: Secondary | ICD-10-CM | POA: Diagnosis not present

## 2023-08-27 DIAGNOSIS — I700 Atherosclerosis of aorta: Secondary | ICD-10-CM | POA: Diagnosis not present

## 2023-08-27 DIAGNOSIS — J9611 Chronic respiratory failure with hypoxia: Secondary | ICD-10-CM | POA: Diagnosis not present

## 2023-08-27 DIAGNOSIS — E7800 Pure hypercholesterolemia, unspecified: Secondary | ICD-10-CM | POA: Diagnosis not present

## 2023-08-28 DIAGNOSIS — I700 Atherosclerosis of aorta: Secondary | ICD-10-CM | POA: Diagnosis not present

## 2023-08-28 DIAGNOSIS — D649 Anemia, unspecified: Secondary | ICD-10-CM | POA: Diagnosis not present

## 2023-08-28 DIAGNOSIS — J9612 Chronic respiratory failure with hypercapnia: Secondary | ICD-10-CM | POA: Diagnosis not present

## 2023-08-28 DIAGNOSIS — K219 Gastro-esophageal reflux disease without esophagitis: Secondary | ICD-10-CM | POA: Diagnosis not present

## 2023-08-28 DIAGNOSIS — J9611 Chronic respiratory failure with hypoxia: Secondary | ICD-10-CM | POA: Diagnosis not present

## 2023-08-28 DIAGNOSIS — I2510 Atherosclerotic heart disease of native coronary artery without angina pectoris: Secondary | ICD-10-CM | POA: Diagnosis not present

## 2023-08-28 DIAGNOSIS — E7800 Pure hypercholesterolemia, unspecified: Secondary | ICD-10-CM | POA: Diagnosis not present

## 2023-08-28 DIAGNOSIS — J439 Emphysema, unspecified: Secondary | ICD-10-CM | POA: Diagnosis not present

## 2023-08-28 DIAGNOSIS — R339 Retention of urine, unspecified: Secondary | ICD-10-CM | POA: Diagnosis not present

## 2023-09-02 DIAGNOSIS — K219 Gastro-esophageal reflux disease without esophagitis: Secondary | ICD-10-CM | POA: Diagnosis not present

## 2023-09-02 DIAGNOSIS — D649 Anemia, unspecified: Secondary | ICD-10-CM | POA: Diagnosis not present

## 2023-09-02 DIAGNOSIS — J9612 Chronic respiratory failure with hypercapnia: Secondary | ICD-10-CM | POA: Diagnosis not present

## 2023-09-02 DIAGNOSIS — E7800 Pure hypercholesterolemia, unspecified: Secondary | ICD-10-CM | POA: Diagnosis not present

## 2023-09-02 DIAGNOSIS — J439 Emphysema, unspecified: Secondary | ICD-10-CM | POA: Diagnosis not present

## 2023-09-02 DIAGNOSIS — J9611 Chronic respiratory failure with hypoxia: Secondary | ICD-10-CM | POA: Diagnosis not present

## 2023-09-02 DIAGNOSIS — I2510 Atherosclerotic heart disease of native coronary artery without angina pectoris: Secondary | ICD-10-CM | POA: Diagnosis not present

## 2023-09-02 DIAGNOSIS — I700 Atherosclerosis of aorta: Secondary | ICD-10-CM | POA: Diagnosis not present

## 2023-09-02 DIAGNOSIS — R339 Retention of urine, unspecified: Secondary | ICD-10-CM | POA: Diagnosis not present

## 2023-09-10 ENCOUNTER — Other Ambulatory Visit: Payer: Self-pay | Admitting: Orthopedic Surgery

## 2023-09-10 ENCOUNTER — Other Ambulatory Visit (HOSPITAL_BASED_OUTPATIENT_CLINIC_OR_DEPARTMENT_OTHER): Payer: Self-pay

## 2023-09-10 MED ORDER — TRAMADOL HCL 50 MG PO TABS
50.0000 mg | ORAL_TABLET | Freq: Four times a day (QID) | ORAL | 0 refills | Status: AC | PRN
  Filled 2023-09-10: qty 20, 5d supply, fill #0

## 2023-09-11 DIAGNOSIS — R339 Retention of urine, unspecified: Secondary | ICD-10-CM | POA: Diagnosis not present

## 2023-09-11 DIAGNOSIS — J439 Emphysema, unspecified: Secondary | ICD-10-CM | POA: Diagnosis not present

## 2023-09-11 DIAGNOSIS — K219 Gastro-esophageal reflux disease without esophagitis: Secondary | ICD-10-CM | POA: Diagnosis not present

## 2023-09-11 DIAGNOSIS — D649 Anemia, unspecified: Secondary | ICD-10-CM | POA: Diagnosis not present

## 2023-09-11 DIAGNOSIS — J9612 Chronic respiratory failure with hypercapnia: Secondary | ICD-10-CM | POA: Diagnosis not present

## 2023-09-11 DIAGNOSIS — J9611 Chronic respiratory failure with hypoxia: Secondary | ICD-10-CM | POA: Diagnosis not present

## 2023-09-11 DIAGNOSIS — I700 Atherosclerosis of aorta: Secondary | ICD-10-CM | POA: Diagnosis not present

## 2023-09-11 DIAGNOSIS — I2510 Atherosclerotic heart disease of native coronary artery without angina pectoris: Secondary | ICD-10-CM | POA: Diagnosis not present

## 2023-09-11 DIAGNOSIS — E7800 Pure hypercholesterolemia, unspecified: Secondary | ICD-10-CM | POA: Diagnosis not present

## 2023-09-12 DIAGNOSIS — R339 Retention of urine, unspecified: Secondary | ICD-10-CM | POA: Diagnosis not present

## 2023-09-12 DIAGNOSIS — J439 Emphysema, unspecified: Secondary | ICD-10-CM | POA: Diagnosis not present

## 2023-09-12 DIAGNOSIS — D649 Anemia, unspecified: Secondary | ICD-10-CM | POA: Diagnosis not present

## 2023-09-12 DIAGNOSIS — I700 Atherosclerosis of aorta: Secondary | ICD-10-CM | POA: Diagnosis not present

## 2023-09-12 DIAGNOSIS — K219 Gastro-esophageal reflux disease without esophagitis: Secondary | ICD-10-CM | POA: Diagnosis not present

## 2023-09-12 DIAGNOSIS — E7800 Pure hypercholesterolemia, unspecified: Secondary | ICD-10-CM | POA: Diagnosis not present

## 2023-09-12 DIAGNOSIS — I2510 Atherosclerotic heart disease of native coronary artery without angina pectoris: Secondary | ICD-10-CM | POA: Diagnosis not present

## 2023-09-12 DIAGNOSIS — J9612 Chronic respiratory failure with hypercapnia: Secondary | ICD-10-CM | POA: Diagnosis not present

## 2023-09-12 DIAGNOSIS — J9611 Chronic respiratory failure with hypoxia: Secondary | ICD-10-CM | POA: Diagnosis not present

## 2023-09-13 ENCOUNTER — Telehealth: Payer: Self-pay | Admitting: Family Medicine

## 2023-09-13 NOTE — Telephone Encounter (Signed)
VM left informing Gracie to return call for verbal orders.

## 2023-09-13 NOTE — Telephone Encounter (Signed)
Copied from CRM [id number]. Topic: General - Other >> Sep 13, 2023 10:07 AM Epimenio Foot F wrote: Reason for CRM: Home Health Verbal Orders - Caller/Agency: [facility] Callback Number: [phone] Service Requested: Physical Therapy, Home Health aid for bathing(1 week 8) Frequency: 1 week 9  Any new concerns about the patient? No

## 2023-09-15 DIAGNOSIS — J9611 Chronic respiratory failure with hypoxia: Secondary | ICD-10-CM | POA: Diagnosis not present

## 2023-09-15 DIAGNOSIS — E7800 Pure hypercholesterolemia, unspecified: Secondary | ICD-10-CM | POA: Diagnosis not present

## 2023-09-15 DIAGNOSIS — J439 Emphysema, unspecified: Secondary | ICD-10-CM | POA: Diagnosis not present

## 2023-09-15 DIAGNOSIS — I2510 Atherosclerotic heart disease of native coronary artery without angina pectoris: Secondary | ICD-10-CM | POA: Diagnosis not present

## 2023-09-15 DIAGNOSIS — R339 Retention of urine, unspecified: Secondary | ICD-10-CM | POA: Diagnosis not present

## 2023-09-15 DIAGNOSIS — I700 Atherosclerosis of aorta: Secondary | ICD-10-CM | POA: Diagnosis not present

## 2023-09-15 DIAGNOSIS — D649 Anemia, unspecified: Secondary | ICD-10-CM | POA: Diagnosis not present

## 2023-09-15 DIAGNOSIS — K219 Gastro-esophageal reflux disease without esophagitis: Secondary | ICD-10-CM | POA: Diagnosis not present

## 2023-09-15 DIAGNOSIS — J9612 Chronic respiratory failure with hypercapnia: Secondary | ICD-10-CM | POA: Diagnosis not present

## 2023-09-18 ENCOUNTER — Other Ambulatory Visit (HOSPITAL_BASED_OUTPATIENT_CLINIC_OR_DEPARTMENT_OTHER): Payer: Self-pay

## 2023-09-18 ENCOUNTER — Other Ambulatory Visit (HOSPITAL_COMMUNITY)
Admission: RE | Admit: 2023-09-18 | Discharge: 2023-09-18 | Disposition: A | Discharge status: Home / Self Care | Payer: Medicare HMO | Source: Ambulatory Visit | Attending: Family Medicine | Admitting: Family Medicine

## 2023-09-18 ENCOUNTER — Encounter: Payer: Self-pay | Admitting: Family Medicine

## 2023-09-18 ENCOUNTER — Other Ambulatory Visit: Payer: Self-pay

## 2023-09-18 ENCOUNTER — Ambulatory Visit: Payer: Medicare HMO | Attending: Family Medicine | Admitting: Family Medicine

## 2023-09-18 ENCOUNTER — Ambulatory Visit: Payer: Self-pay

## 2023-09-18 VITALS — BP 123/68 | HR 92 | Ht 64.0 in

## 2023-09-18 DIAGNOSIS — N898 Other specified noninflammatory disorders of vagina: Secondary | ICD-10-CM | POA: Diagnosis not present

## 2023-09-18 DIAGNOSIS — N3001 Acute cystitis with hematuria: Secondary | ICD-10-CM

## 2023-09-18 LAB — POCT URINALYSIS DIP (CLINITEK)
Bilirubin, UA: NEGATIVE
Glucose, UA: NEGATIVE mg/dL
Ketones, POC UA: NEGATIVE mg/dL
Nitrite, UA: NEGATIVE
Spec Grav, UA: >=1.03 — AB (ref 1.010–1.025)
Urobilinogen, UA: 0.2 U/dL
pH, UA: 6 (ref 5.0–8.0)

## 2023-09-18 MED ORDER — CLOTRIMAZOLE 1 % EX CREA
1.0000 | TOPICAL_CREAM | Freq: Two times a day (BID) | CUTANEOUS | 0 refills | Status: DC
  Filled 2023-09-18 – 2023-09-23 (×2): qty 60, 30d supply, fill #0

## 2023-09-18 NOTE — Progress Notes (Signed)
Subjective:  Patient ID: Margaret Henry, female    DOB: 19-Nov-1950  Age: 72 y.o. MRN: [id number]  CC: Vaginal Itching   HPI Margaret Henry is a 72 y.o. 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   Interval History: Discussed the use of AI scribe software for clinical note transcription with the patient, who gave verbal consent to proceed.    The patient presents with severe vaginal itching and burning that started three nights ago. The area is raw and bleeds when wiped. She has not noticed any discharge.  She has no urinary frequency or flank pain.  She has tried Neosporin and Benadryl cream without relief. The patient has a history of a yeast infection following an oophorectomy in 2001 but that was the last time she experienced similar symptoms. She has not recently started any new antibiotics. The patient has been on the same antibiotic for two years. She recently refilled her prescription, but the dosage did not change.        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)   Anal intraepithelial neoplasia II (AIN II) 2017   Anemia 04/06/22   Aortic atherosclerosis (HCC)    B12 deficiency    Blood transfusion without reported diagnosis 6/23   Bronchitis    Cancer (HCC)    skin cancer on chest   Cataract 5/23   Chest pain 06/12/2016   Cholelithiasis    Colon polyps    Complication of anesthesia 05/16/2021   pt states was given too much during nasal surgery 1989; difficulty getting awake   COPD (chronic obstructive pulmonary disease) (HCC)    Coronary artery calcification    Diverticulitis 05/16/2021   occasional   Diverticulosis    Dyspnea 06/27/2016   Emphysema of lung (HCC)    External hemorrhoids    GERD  (gastroesophageal reflux disease)    occasional   Hemorrhoids    High cholesterol    Hyperlipidemia LDL goal <70    Iron deficiency    Mild aortic insufficiency    Neuropathy 04/07/2022   Osteoporosis    Oxygen deficiency    Pneumonia    Shortness of breath dyspnea    with exertion    Thyroid goiter    bx benign   Tobacco abuse    Vertigo 04/21/2017    Past Surgical History:  Procedure Laterality Date   ABDOMINAL HYSTERECTOMY     APPENDECTOMY  1973   BILATERAL OOPHORECTOMY  10/09/1999   for benign ovarian mass    BIOPSY THYROID     DENTAL SURGERY     dentures   NASAL SEPTUM SURGERY     PARTIAL HYSTERECTOMY  10/08/1974   for heavy menses    RECTAL EXAM UNDER ANESTHESIA N/A 12/14/2015   Procedure: RECTAL EXAM UNDER ANESTHESIA REMOVAL OF ANAL CANAL MASS; INTERNAL HEMORRHOID LIGATION, EXTERNAL HEMORRHOID LIGATION;  Surgeon: Karie Soda, MD;  Location: [facility];  Service: General;  Laterality: N/A;   TUBAL LIGATION     WISDOM TOOTH EXTRACTION      Family History  Problem Relation Age of Onset   Heart disease Mother    Alcohol abuse Mother    Asthma Mother    COPD Father    Heart  disease Father    Lung cancer Maternal Grandfather    Cancer Maternal Grandfather    Liver cancer Paternal Grandmother    Cancer Paternal Grandmother    Diabetes Paternal Aunt    Colon cancer Neg Hx    Colon polyps Neg Hx    Esophageal cancer Neg Hx    Rectal cancer Neg Hx    Stomach cancer Neg Hx     Social History   Socioeconomic History   Marital status: Widowed    Spouse name: Not on file   Number of children: Not on file   Years of education: Not on file   Highest education level: 9th grade  Occupational History   Not on file  Tobacco Use   Smoking status: Former    Current packs/day: 0.00    Average packs/day: 1.5 packs/day for 50.0 years (75.0 ttl pk-yrs)    Types: Cigarettes    Start date: 11/03/1968    Quit date: 11/03/2018    Years since quitting: 4.8   Smokeless  tobacco: Never  Vaping Use   Vaping status: Never Used  Substance and Sexual Activity   Alcohol use: No   Drug use: No   Sexual activity: Never  Other Topics Concern   Not on file  Social History Narrative   Not on file   Social Determinants of Health   Financial Resource Strain: Low Risk  (03/05/2023)   Overall Financial Resource Strain (CARDIA)    Difficulty of Paying Living Expenses: Not hard at all  Food Insecurity: No Food Insecurity (05/21/2023)   Hunger Vital Sign    Worried About Running Out of Food in the Last Year: Never true    Ran Out of Food in the Last Year: Never true  Transportation Needs: No Transportation Needs (05/27/2023)   PRAPARE - Administrator, Civil Service (Medical): No    Lack of Transportation (Non-Medical): No  Physical Activity: Insufficiently Active (03/05/2023)   Exercise Vital Sign    Days of Exercise per Week: 3 days    Minutes of Exercise per Session: 30 min  Stress: No Stress Concern Present (03/05/2023)   [employer] of Occupational Health - Occupational Stress Questionnaire    Feeling of Stress : Not at all  Social Connections: Socially Isolated (03/05/2023)   Social Connection and Isolation Panel [NHANES]    Frequency of Communication with Friends and Family: More than three times a week    Frequency of Social Gatherings with Friends and Family: More than three times a week    Attends Religious Services: Never    Database administrator or Organizations: No    Attends Banker Meetings: Never    Marital Status: Widowed    Allergies  Allergen Reactions   Hydrocodone Nausea And Vomiting   Propoxyphene N-Acetaminophen Nausea And Vomiting   Augmentin [Amoxicillin-Pot Clavulanate] Rash    Outpatient Medications Prior to Visit  Medication Sig Dispense Refill   albuterol (PROVENTIL) (2.5 MG/3ML) 0.083% nebulizer solution Take 2.5 mg by nebulization every 6 (six) hours as needed for wheezing or shortness of  breath.     atorvastatin (LIPITOR) 40 MG tablet TAKE 1 TABLET EVERY DAY 90 tablet 3   azithromycin (ZITHROMAX) 250 MG tablet Take 1 tablet (250 mg total) by mouth daily. (Patient taking differently: Take 250 mg by mouth daily. Taking every other day) 30 tablet 5   bismuth subsalicylate (PEPTO BISMOL) 262 MG/15ML suspension Take 30 mLs by mouth every 6 (six) hours  as needed for indigestion.     BREO ELLIPTA 200-25 MCG/ACT AEPB INHALE 1 PUFF EVERY DAY 180 each 3   ferrous sulfate 325 (65 FE) MG EC tablet Take 325 mg by mouth every other day. Pt states she is only taking once a week due to constipation     loratadine (CLARITIN) 10 MG tablet Take 1 tablet (10 mg total) by mouth daily. 100 tablet 1   mupirocin ointment (BACTROBAN) 2 % Apply 1 Application topically 2 (two) times daily. To affected nostril (Patient taking differently: Place 1 Application into the nose See admin instructions. Apply to affected (inner) nostril(s) one to two times a day) 22 g 0   nitroGLYCERIN (NITROSTAT) 0.4 MG SL tablet Place 1 tablet (0.4 mg total) under the tongue every 5 (five) minutes as needed for chest pain. 25 tablet 6   OXYGEN Inhale 3 L/min into the lungs continuous.     SPIRIVA RESPIMAT 2.5 MCG/ACT AERS INHALE 2 PUFFS EVERY DAY 12 g 3   vitamin B-12 (CYANOCOBALAMIN) 100 MCG tablet Take 100 mcg by mouth daily.     lidocaine (LIDODERM) 5 % Place 1 patch onto the skin daily. Remove & Discard patch within 12 hours or as directed by MD (Patient not taking: Reported on 05/27/2023) 30 patch 0   pantoprazole (PROTONIX) 20 MG tablet Take 1 tablet (20 mg total) by mouth daily. (Patient not taking: Reported on 06/20/2023) 90 tablet 2   predniSONE (DELTASONE) 10 MG tablet Take 1 tablet (10 mg total) by mouth daily with breakfast. (Patient not taking: Reported on 05/27/2023) 30 tablet 1   terbinafine (LAMISIL) 250 MG tablet Take 1 tablet (250 mg total) by mouth daily. 90 tablet 0   No facility-administered medications prior to  visit.     ROS Review of Systems  Constitutional:  Negative for activity change and appetite change.  HENT:  Negative for sinus pressure and sore throat.   Respiratory:  Negative for chest tightness, shortness of breath and wheezing.   Cardiovascular:  Negative for chest pain and palpitations.  Gastrointestinal:  Negative for abdominal distention, abdominal pain and constipation.  Genitourinary: Negative.   Musculoskeletal: Negative.   Psychiatric/Behavioral:  Negative for behavioral problems and dysphoric mood.     Objective:  BP 123/68   Pulse 92   Ht 5\' 4"  (1.626 m)   SpO2 93%   BMI 17.78 kg/m      09/18/2023    3:35 PM 06/20/2023    9:41 AM 06/20/2023    8:56 AM  BP/Weight  Systolic BP 123 124 147  Diastolic BP 68 70 70  Wt. (Lbs)   103.6  BMI   17.78 kg/m2      Physical Exam Constitutional:      Appearance: She is well-developed.  Cardiovascular:     Rate and Rhythm: Normal rate.     Heart sounds: Normal heart sounds. No murmur heard. Pulmonary:     Effort: Pulmonary effort is normal.     Breath sounds: Normal breath sounds. No wheezing or rales.  Chest:     Chest wall: No tenderness.  Abdominal:     General: Bowel sounds are normal. There is no distension.     Palpations: Abdomen is soft. There is no mass.     Tenderness: There is no abdominal tenderness.  Genitourinary:    Comments: Erythema of vulva, No vaginal discharge Musculoskeletal:        General: Normal range of motion.     Right lower  leg: No edema.     Left lower leg: No edema.  Neurological:     Mental Status: She is alert and oriented to person, place, and time.  Psychiatric:        Mood and Affect: Mood normal.        Latest Ref Rng & Units 05/29/2023   11:28 AM 05/22/2023    4:24 AM 05/21/2023    4:16 PM  CMP  Glucose 70 - 99 mg/dL 89  132  440   BUN 8 - 23 mg/dL 11  7  11    Creatinine 0.44 - 1.00 mg/dL 1.02  7.25  3.66   Sodium 135 - 145 mmol/L 140  139  138   Potassium 3.5  - 5.1 mmol/L 3.8  3.9  3.4   Chloride 98 - 111 mmol/L 103  107  103   CO2 22 - 32 mmol/L 29  24  25    Calcium 8.9 - 10.3 mg/dL 8.8  8.9  9.2   Total Protein 6.5 - 8.1 g/dL  6.4  8.0   Total Bilirubin 0.3 - 1.2 mg/dL  0.6  0.8   Alkaline Phos 38 - 126 U/L  56  72   AST 15 - 41 U/L  17  20   ALT 0 - 44 U/L  10  13     Lipid Panel     Component Value Date/Time   CHOL 143 01/30/2022 1158   CHOL 147 02/15/2021 1139   TRIG 132.0 01/30/2022 1158   HDL 74.30 01/30/2022 1158   HDL 58 02/15/2021 1139   CHOLHDL 2 01/30/2022 1158   VLDL 26.4 01/30/2022 1158   LDLCALC 42 01/30/2022 1158   LDLCALC 70 02/15/2021 1139   LDLDIRECT 166 (H) 09/26/2015 1001    CBC    Component Value Date/Time   WBC 11.1 (H) 05/23/2023 1216   RBC 3.85 (L) 05/23/2023 1216   HGB 11.3 (L) 05/23/2023 1216   HGB 12.1 01/03/2023 1352   HCT 36.6 05/23/2023 1216   HCT 37.0 01/03/2023 1352   PLT 210 05/23/2023 1216   PLT 271 01/03/2023 1352   MCV 95.1 05/23/2023 1216   MCV 97 01/03/2023 1352   MCH 29.4 05/23/2023 1216   MCHC 30.9 05/23/2023 1216   RDW 14.0 05/23/2023 1216   RDW 13.1 01/03/2023 1352   LYMPHSABS 1.1 04/22/2023 1135   LYMPHSABS 1.3 01/03/2023 1352   MONOABS 0.9 04/22/2023 1135   EOSABS 0.3 04/22/2023 1135   EOSABS 0.3 01/03/2023 1352   BASOSABS 0.1 04/22/2023 1135   BASOSABS 0.1 01/03/2023 1352    Lab Results  Component Value Date   HGBA1C 5.5 07/04/2022    Assessment & Plan:      Vulvovaginal Symptoms Severe vulvar itching, burning, and rawness with bleeding on wiping. No noted discharge. Last yeast infection was in 2001 post-surgery. No recent changes in antibiotics. Noted redness on vulvar examination. -Collect urine sample for UTI testing -urinalysis reveals small leukocyte esterase, trace blood -Collect vaginal swab for yeast and bacterial vaginosis testing. -Prescribe Clotrimazole and send to pharmacy. -If symptoms persist after treatment, further evaluation needed to exclude  vulvar cancer.        Meds ordered this encounter  Medications   clotrimazole (LOTRIMIN) 1 % cream    Sig: Apply 1 Application topically 2 (two) times daily.    Dispense:  60 g    Refill:  0    Follow-up: Return for previously scheduled appointment.  Hoy Register, MD, FAAFP. [facility] [place], [place] [phone]   09/18/2023, 4:43 PM

## 2023-09-18 NOTE — Patient Instructions (Signed)

## 2023-09-18 NOTE — Telephone Encounter (Signed)
Summary: Possible yeast infection   The patient called in stating she believes she has a yeast infection. It has been going on for a few days. She said when she cleans herself it gets raw and burns and it bothersome. Please assist patient further        Chief Complaint: Vaginal itching, raw, sees blood when she wipes. Symptoms: Above Frequency: Sunday Pertinent Negatives: Patient denies  Disposition: [] ED /[] Urgent Care (no appt availability in office) / [x] Appointment(In office/virtual)/ []  [facility]/ [] Home Care/ [] Refused Recommended Disposition /[] [facility]/ []  Follow-up with PCP Additional Notes: Agrees with appointment.  Reason for Disposition  MODERATE-SEVERE itching (i.e., interferes with school, work, or sleep)  Answer Assessment - Initial Assessment Questions 1. SYMPTOM: "What's the main symptom you're concerned about?" (e.g., pain, itching, dryness)     Raw, burning 2. LOCATION: "Where is the  located?" (e.g., inside/outside, left/right)     Inside 3. ONSET: "When did the    start?"     Sunday 4. PAIN: "Is there any pain?" If Yes, ask: "How bad is it?" (Scale: 1-10; mild, moderate, severe)   -  MILD (1-3): Doesn't interfere with normal activities.    -  MODERATE (4-7): Interferes with normal activities (e.g., work or school) or awakens from sleep.     -  SEVERE (8-10): Excruciating pain, unable to do any normal activities.     8 5. ITCHING: "Is there any itching?" If Yes, ask: "How bad is it?" (Scale: 1-10; mild, moderate, severe)     8 6. CAUSE: "What do you think is causing the discharge?" "Have you had the same problem before? What happened then?"     Yeast 7. OTHER SYMPTOMS: "Do you have any other symptoms?" (e.g., fever, itching, vaginal bleeding, pain with urination, injury to genital area, vaginal foreign body)     No 8. PREGNANCY: "Is there any chance you are pregnant?" "When was your last menstrual period?"     No  Protocols used:  Vaginal Symptoms-A-AH

## 2023-09-19 ENCOUNTER — Other Ambulatory Visit: Payer: Self-pay

## 2023-09-19 ENCOUNTER — Other Ambulatory Visit: Payer: Self-pay | Admitting: Family Medicine

## 2023-09-19 ENCOUNTER — Other Ambulatory Visit (HOSPITAL_BASED_OUTPATIENT_CLINIC_OR_DEPARTMENT_OTHER): Payer: Self-pay

## 2023-09-19 LAB — CERVICOVAGINAL ANCILLARY ONLY
Bacterial Vaginitis (gardnerella): NEGATIVE
Candida Glabrata: POSITIVE — AB
Candida Vaginitis: NEGATIVE
Chlamydia: NEGATIVE
Comment: NEGATIVE
Comment: NEGATIVE
Comment: NEGATIVE
Comment: NEGATIVE
Comment: NEGATIVE
Comment: NORMAL
Neisseria Gonorrhea: NEGATIVE
Trichomonas: NEGATIVE

## 2023-09-19 MED ORDER — FLUCONAZOLE 150 MG PO TABS
150.0000 mg | ORAL_TABLET | Freq: Once | ORAL | 0 refills | Status: AC
  Filled 2023-09-19: qty 1, 1d supply, fill #0

## 2023-09-20 ENCOUNTER — Other Ambulatory Visit: Payer: Self-pay

## 2023-09-20 DIAGNOSIS — J9612 Chronic respiratory failure with hypercapnia: Secondary | ICD-10-CM | POA: Diagnosis not present

## 2023-09-20 DIAGNOSIS — J439 Emphysema, unspecified: Secondary | ICD-10-CM | POA: Diagnosis not present

## 2023-09-20 DIAGNOSIS — J9611 Chronic respiratory failure with hypoxia: Secondary | ICD-10-CM | POA: Diagnosis not present

## 2023-09-20 DIAGNOSIS — I2510 Atherosclerotic heart disease of native coronary artery without angina pectoris: Secondary | ICD-10-CM | POA: Diagnosis not present

## 2023-09-20 DIAGNOSIS — I700 Atherosclerosis of aorta: Secondary | ICD-10-CM | POA: Diagnosis not present

## 2023-09-20 DIAGNOSIS — K219 Gastro-esophageal reflux disease without esophagitis: Secondary | ICD-10-CM | POA: Diagnosis not present

## 2023-09-20 DIAGNOSIS — R339 Retention of urine, unspecified: Secondary | ICD-10-CM | POA: Diagnosis not present

## 2023-09-20 DIAGNOSIS — D649 Anemia, unspecified: Secondary | ICD-10-CM | POA: Diagnosis not present

## 2023-09-20 DIAGNOSIS — E7800 Pure hypercholesterolemia, unspecified: Secondary | ICD-10-CM | POA: Diagnosis not present

## 2023-09-21 LAB — URINE CULTURE

## 2023-09-23 ENCOUNTER — Telehealth: Payer: Self-pay | Admitting: Family Medicine

## 2023-09-23 ENCOUNTER — Other Ambulatory Visit: Payer: Self-pay | Admitting: Family Medicine

## 2023-09-23 ENCOUNTER — Other Ambulatory Visit (HOSPITAL_BASED_OUTPATIENT_CLINIC_OR_DEPARTMENT_OTHER): Payer: Self-pay

## 2023-09-23 MED ORDER — NITROFURANTOIN MONOHYD MACRO 100 MG PO CAPS
100.0000 mg | ORAL_CAPSULE | Freq: Two times a day (BID) | ORAL | 0 refills | Status: DC
  Filled 2023-09-23: qty 10, 5d supply, fill #0

## 2023-09-23 NOTE — Telephone Encounter (Signed)
Patient called and advised clotrimazole was sent to [facility] showing on 09/18/23 in the chart. She says it was sent to the [facility] location and she called [facility] and they said don't have it. I advised to call back because it's showing in the chart. She says they told her it would be available tomorrow.

## 2023-09-23 NOTE — Telephone Encounter (Signed)
Medication Refill -  Most Recent Primary Care Visit:  Provider: Hoy Register  Department: [facility]  Visit Type: SAME DAY  Date: 09/18/2023  Medication: clotrimazole (LOTRIMIN) 1 % cream   Pt is requesting medication be transferred to the pharmacy below. Please advise.  Has the patient contacted their pharmacy? Yes  (Agent: If yes, when and what did the pharmacy advise?)  Is this the correct pharmacy for this prescription? Yes If no, delete pharmacy and type the correct one.  This is the patient's preferred pharmacy:   [facility] [address] [place] [address] Phone: [phone] Fax: [phone]   Has the prescription been filled recently? Yes  Is the patient out of the medication? Yes  Has the patient been seen for an appointment in the last year OR does the patient have an upcoming appointment? Yes  Can we respond through MyChart? Yes  Agent: Please be advised that Rx refills may take up to 3 business days. We ask that you follow-up with your pharmacy.

## 2023-09-24 ENCOUNTER — Other Ambulatory Visit (HOSPITAL_COMMUNITY): Payer: Self-pay

## 2023-09-24 ENCOUNTER — Other Ambulatory Visit: Payer: Self-pay | Admitting: Family Medicine

## 2023-09-24 ENCOUNTER — Other Ambulatory Visit: Payer: Self-pay

## 2023-09-24 ENCOUNTER — Other Ambulatory Visit (HOSPITAL_BASED_OUTPATIENT_CLINIC_OR_DEPARTMENT_OTHER): Payer: Self-pay

## 2023-09-24 MED ORDER — NITROFURANTOIN MACROCRYSTAL 100 MG PO CAPS
100.0000 mg | ORAL_CAPSULE | Freq: Two times a day (BID) | ORAL | 0 refills | Status: DC
Start: 2023-09-24 — End: 2024-02-06
  Filled 2023-09-24: qty 10, 5d supply, fill #0

## 2023-09-25 ENCOUNTER — Other Ambulatory Visit (HOSPITAL_BASED_OUTPATIENT_CLINIC_OR_DEPARTMENT_OTHER): Payer: Self-pay

## 2023-09-25 DIAGNOSIS — I2510 Atherosclerotic heart disease of native coronary artery without angina pectoris: Secondary | ICD-10-CM | POA: Diagnosis not present

## 2023-09-25 DIAGNOSIS — J9611 Chronic respiratory failure with hypoxia: Secondary | ICD-10-CM | POA: Diagnosis not present

## 2023-09-25 DIAGNOSIS — R339 Retention of urine, unspecified: Secondary | ICD-10-CM | POA: Diagnosis not present

## 2023-09-25 DIAGNOSIS — J439 Emphysema, unspecified: Secondary | ICD-10-CM | POA: Diagnosis not present

## 2023-09-25 DIAGNOSIS — I700 Atherosclerosis of aorta: Secondary | ICD-10-CM | POA: Diagnosis not present

## 2023-09-25 DIAGNOSIS — J9612 Chronic respiratory failure with hypercapnia: Secondary | ICD-10-CM | POA: Diagnosis not present

## 2023-09-25 DIAGNOSIS — E7800 Pure hypercholesterolemia, unspecified: Secondary | ICD-10-CM | POA: Diagnosis not present

## 2023-09-25 DIAGNOSIS — K219 Gastro-esophageal reflux disease without esophagitis: Secondary | ICD-10-CM | POA: Diagnosis not present

## 2023-09-25 DIAGNOSIS — D649 Anemia, unspecified: Secondary | ICD-10-CM | POA: Diagnosis not present

## 2023-09-26 DIAGNOSIS — I2510 Atherosclerotic heart disease of native coronary artery without angina pectoris: Secondary | ICD-10-CM | POA: Diagnosis not present

## 2023-09-26 DIAGNOSIS — K219 Gastro-esophageal reflux disease without esophagitis: Secondary | ICD-10-CM | POA: Diagnosis not present

## 2023-09-26 DIAGNOSIS — R339 Retention of urine, unspecified: Secondary | ICD-10-CM | POA: Diagnosis not present

## 2023-09-26 DIAGNOSIS — J9612 Chronic respiratory failure with hypercapnia: Secondary | ICD-10-CM | POA: Diagnosis not present

## 2023-09-26 DIAGNOSIS — J439 Emphysema, unspecified: Secondary | ICD-10-CM | POA: Diagnosis not present

## 2023-09-26 DIAGNOSIS — E7800 Pure hypercholesterolemia, unspecified: Secondary | ICD-10-CM | POA: Diagnosis not present

## 2023-09-26 DIAGNOSIS — J9611 Chronic respiratory failure with hypoxia: Secondary | ICD-10-CM | POA: Diagnosis not present

## 2023-09-26 DIAGNOSIS — I700 Atherosclerosis of aorta: Secondary | ICD-10-CM | POA: Diagnosis not present

## 2023-09-26 DIAGNOSIS — D649 Anemia, unspecified: Secondary | ICD-10-CM | POA: Diagnosis not present

## 2023-09-30 ENCOUNTER — Ambulatory Visit
Admission: RE | Admit: 2023-09-30 | Discharge: 2023-09-30 | Disposition: A | Discharge status: Home / Self Care | Payer: Medicare HMO | Source: Ambulatory Visit | Attending: Family Medicine | Admitting: Family Medicine

## 2023-09-30 DIAGNOSIS — N6489 Other specified disorders of breast: Secondary | ICD-10-CM

## 2023-09-30 DIAGNOSIS — R921 Mammographic calcification found on diagnostic imaging of breast: Secondary | ICD-10-CM | POA: Diagnosis not present

## 2023-10-03 DIAGNOSIS — J439 Emphysema, unspecified: Secondary | ICD-10-CM | POA: Diagnosis not present

## 2023-10-03 DIAGNOSIS — J9611 Chronic respiratory failure with hypoxia: Secondary | ICD-10-CM | POA: Diagnosis not present

## 2023-10-03 DIAGNOSIS — I2510 Atherosclerotic heart disease of native coronary artery without angina pectoris: Secondary | ICD-10-CM | POA: Diagnosis not present

## 2023-10-03 DIAGNOSIS — I700 Atherosclerosis of aorta: Secondary | ICD-10-CM | POA: Diagnosis not present

## 2023-10-03 DIAGNOSIS — E7800 Pure hypercholesterolemia, unspecified: Secondary | ICD-10-CM | POA: Diagnosis not present

## 2023-10-03 DIAGNOSIS — J9612 Chronic respiratory failure with hypercapnia: Secondary | ICD-10-CM | POA: Diagnosis not present

## 2023-10-03 DIAGNOSIS — R339 Retention of urine, unspecified: Secondary | ICD-10-CM | POA: Diagnosis not present

## 2023-10-03 DIAGNOSIS — K219 Gastro-esophageal reflux disease without esophagitis: Secondary | ICD-10-CM | POA: Diagnosis not present

## 2023-10-03 DIAGNOSIS — D649 Anemia, unspecified: Secondary | ICD-10-CM | POA: Diagnosis not present

## 2023-10-10 DIAGNOSIS — I700 Atherosclerosis of aorta: Secondary | ICD-10-CM | POA: Diagnosis not present

## 2023-10-10 DIAGNOSIS — J9611 Chronic respiratory failure with hypoxia: Secondary | ICD-10-CM | POA: Diagnosis not present

## 2023-10-10 DIAGNOSIS — D649 Anemia, unspecified: Secondary | ICD-10-CM | POA: Diagnosis not present

## 2023-10-10 DIAGNOSIS — J439 Emphysema, unspecified: Secondary | ICD-10-CM | POA: Diagnosis not present

## 2023-10-10 DIAGNOSIS — R339 Retention of urine, unspecified: Secondary | ICD-10-CM | POA: Diagnosis not present

## 2023-10-10 DIAGNOSIS — K219 Gastro-esophageal reflux disease without esophagitis: Secondary | ICD-10-CM | POA: Diagnosis not present

## 2023-10-10 DIAGNOSIS — J9612 Chronic respiratory failure with hypercapnia: Secondary | ICD-10-CM | POA: Diagnosis not present

## 2023-10-10 DIAGNOSIS — I2510 Atherosclerotic heart disease of native coronary artery without angina pectoris: Secondary | ICD-10-CM | POA: Diagnosis not present

## 2023-10-10 DIAGNOSIS — E7800 Pure hypercholesterolemia, unspecified: Secondary | ICD-10-CM | POA: Diagnosis not present

## 2023-10-15 DIAGNOSIS — D649 Anemia, unspecified: Secondary | ICD-10-CM | POA: Diagnosis not present

## 2023-10-15 DIAGNOSIS — J9611 Chronic respiratory failure with hypoxia: Secondary | ICD-10-CM | POA: Diagnosis not present

## 2023-10-15 DIAGNOSIS — J9612 Chronic respiratory failure with hypercapnia: Secondary | ICD-10-CM | POA: Diagnosis not present

## 2023-10-15 DIAGNOSIS — E7800 Pure hypercholesterolemia, unspecified: Secondary | ICD-10-CM | POA: Diagnosis not present

## 2023-10-15 DIAGNOSIS — I700 Atherosclerosis of aorta: Secondary | ICD-10-CM | POA: Diagnosis not present

## 2023-10-15 DIAGNOSIS — R339 Retention of urine, unspecified: Secondary | ICD-10-CM | POA: Diagnosis not present

## 2023-10-15 DIAGNOSIS — K219 Gastro-esophageal reflux disease without esophagitis: Secondary | ICD-10-CM | POA: Diagnosis not present

## 2023-10-15 DIAGNOSIS — I2510 Atherosclerotic heart disease of native coronary artery without angina pectoris: Secondary | ICD-10-CM | POA: Diagnosis not present

## 2023-10-15 DIAGNOSIS — J439 Emphysema, unspecified: Secondary | ICD-10-CM | POA: Diagnosis not present

## 2023-10-16 DIAGNOSIS — J439 Emphysema, unspecified: Secondary | ICD-10-CM | POA: Diagnosis not present

## 2023-10-16 DIAGNOSIS — J9611 Chronic respiratory failure with hypoxia: Secondary | ICD-10-CM | POA: Diagnosis not present

## 2023-10-16 DIAGNOSIS — J9612 Chronic respiratory failure with hypercapnia: Secondary | ICD-10-CM | POA: Diagnosis not present

## 2023-10-16 DIAGNOSIS — K219 Gastro-esophageal reflux disease without esophagitis: Secondary | ICD-10-CM | POA: Diagnosis not present

## 2023-10-16 DIAGNOSIS — I700 Atherosclerosis of aorta: Secondary | ICD-10-CM | POA: Diagnosis not present

## 2023-10-16 DIAGNOSIS — I2510 Atherosclerotic heart disease of native coronary artery without angina pectoris: Secondary | ICD-10-CM | POA: Diagnosis not present

## 2023-10-16 DIAGNOSIS — E7800 Pure hypercholesterolemia, unspecified: Secondary | ICD-10-CM | POA: Diagnosis not present

## 2023-10-16 DIAGNOSIS — D649 Anemia, unspecified: Secondary | ICD-10-CM | POA: Diagnosis not present

## 2023-10-16 DIAGNOSIS — R339 Retention of urine, unspecified: Secondary | ICD-10-CM | POA: Diagnosis not present

## 2023-10-23 DIAGNOSIS — I2510 Atherosclerotic heart disease of native coronary artery without angina pectoris: Secondary | ICD-10-CM | POA: Diagnosis not present

## 2023-10-23 DIAGNOSIS — J9611 Chronic respiratory failure with hypoxia: Secondary | ICD-10-CM | POA: Diagnosis not present

## 2023-10-23 DIAGNOSIS — J439 Emphysema, unspecified: Secondary | ICD-10-CM | POA: Diagnosis not present

## 2023-10-23 DIAGNOSIS — J9612 Chronic respiratory failure with hypercapnia: Secondary | ICD-10-CM | POA: Diagnosis not present

## 2023-10-23 DIAGNOSIS — R339 Retention of urine, unspecified: Secondary | ICD-10-CM | POA: Diagnosis not present

## 2023-10-23 DIAGNOSIS — D649 Anemia, unspecified: Secondary | ICD-10-CM | POA: Diagnosis not present

## 2023-10-23 DIAGNOSIS — I700 Atherosclerosis of aorta: Secondary | ICD-10-CM | POA: Diagnosis not present

## 2023-10-23 DIAGNOSIS — E7800 Pure hypercholesterolemia, unspecified: Secondary | ICD-10-CM | POA: Diagnosis not present

## 2023-10-23 DIAGNOSIS — K219 Gastro-esophageal reflux disease without esophagitis: Secondary | ICD-10-CM | POA: Diagnosis not present

## 2023-10-24 DIAGNOSIS — J9612 Chronic respiratory failure with hypercapnia: Secondary | ICD-10-CM | POA: Diagnosis not present

## 2023-10-24 DIAGNOSIS — D649 Anemia, unspecified: Secondary | ICD-10-CM | POA: Diagnosis not present

## 2023-10-24 DIAGNOSIS — R339 Retention of urine, unspecified: Secondary | ICD-10-CM | POA: Diagnosis not present

## 2023-10-24 DIAGNOSIS — I700 Atherosclerosis of aorta: Secondary | ICD-10-CM | POA: Diagnosis not present

## 2023-10-24 DIAGNOSIS — E7800 Pure hypercholesterolemia, unspecified: Secondary | ICD-10-CM | POA: Diagnosis not present

## 2023-10-24 DIAGNOSIS — K219 Gastro-esophageal reflux disease without esophagitis: Secondary | ICD-10-CM | POA: Diagnosis not present

## 2023-10-24 DIAGNOSIS — I2510 Atherosclerotic heart disease of native coronary artery without angina pectoris: Secondary | ICD-10-CM | POA: Diagnosis not present

## 2023-10-24 DIAGNOSIS — J9611 Chronic respiratory failure with hypoxia: Secondary | ICD-10-CM | POA: Diagnosis not present

## 2023-10-24 DIAGNOSIS — J439 Emphysema, unspecified: Secondary | ICD-10-CM | POA: Diagnosis not present

## 2023-10-30 DIAGNOSIS — I700 Atherosclerosis of aorta: Secondary | ICD-10-CM | POA: Diagnosis not present

## 2023-10-30 DIAGNOSIS — J439 Emphysema, unspecified: Secondary | ICD-10-CM | POA: Diagnosis not present

## 2023-10-30 DIAGNOSIS — R339 Retention of urine, unspecified: Secondary | ICD-10-CM | POA: Diagnosis not present

## 2023-10-30 DIAGNOSIS — J9611 Chronic respiratory failure with hypoxia: Secondary | ICD-10-CM | POA: Diagnosis not present

## 2023-10-30 DIAGNOSIS — K219 Gastro-esophageal reflux disease without esophagitis: Secondary | ICD-10-CM | POA: Diagnosis not present

## 2023-10-30 DIAGNOSIS — J9612 Chronic respiratory failure with hypercapnia: Secondary | ICD-10-CM | POA: Diagnosis not present

## 2023-10-30 DIAGNOSIS — I2510 Atherosclerotic heart disease of native coronary artery without angina pectoris: Secondary | ICD-10-CM | POA: Diagnosis not present

## 2023-10-30 DIAGNOSIS — E7800 Pure hypercholesterolemia, unspecified: Secondary | ICD-10-CM | POA: Diagnosis not present

## 2023-10-30 DIAGNOSIS — D649 Anemia, unspecified: Secondary | ICD-10-CM | POA: Diagnosis not present

## 2023-10-31 DIAGNOSIS — I2510 Atherosclerotic heart disease of native coronary artery without angina pectoris: Secondary | ICD-10-CM | POA: Diagnosis not present

## 2023-10-31 DIAGNOSIS — E7800 Pure hypercholesterolemia, unspecified: Secondary | ICD-10-CM | POA: Diagnosis not present

## 2023-10-31 DIAGNOSIS — J9611 Chronic respiratory failure with hypoxia: Secondary | ICD-10-CM | POA: Diagnosis not present

## 2023-10-31 DIAGNOSIS — K219 Gastro-esophageal reflux disease without esophagitis: Secondary | ICD-10-CM | POA: Diagnosis not present

## 2023-10-31 DIAGNOSIS — J9612 Chronic respiratory failure with hypercapnia: Secondary | ICD-10-CM | POA: Diagnosis not present

## 2023-10-31 DIAGNOSIS — I700 Atherosclerosis of aorta: Secondary | ICD-10-CM | POA: Diagnosis not present

## 2023-10-31 DIAGNOSIS — J439 Emphysema, unspecified: Secondary | ICD-10-CM | POA: Diagnosis not present

## 2023-10-31 DIAGNOSIS — R339 Retention of urine, unspecified: Secondary | ICD-10-CM | POA: Diagnosis not present

## 2023-10-31 DIAGNOSIS — D649 Anemia, unspecified: Secondary | ICD-10-CM | POA: Diagnosis not present

## 2023-11-06 DIAGNOSIS — I2510 Atherosclerotic heart disease of native coronary artery without angina pectoris: Secondary | ICD-10-CM | POA: Diagnosis not present

## 2023-11-06 DIAGNOSIS — J9612 Chronic respiratory failure with hypercapnia: Secondary | ICD-10-CM | POA: Diagnosis not present

## 2023-11-06 DIAGNOSIS — J9611 Chronic respiratory failure with hypoxia: Secondary | ICD-10-CM | POA: Diagnosis not present

## 2023-11-06 DIAGNOSIS — D649 Anemia, unspecified: Secondary | ICD-10-CM | POA: Diagnosis not present

## 2023-11-06 DIAGNOSIS — R339 Retention of urine, unspecified: Secondary | ICD-10-CM | POA: Diagnosis not present

## 2023-11-06 DIAGNOSIS — I700 Atherosclerosis of aorta: Secondary | ICD-10-CM | POA: Diagnosis not present

## 2023-11-06 DIAGNOSIS — K219 Gastro-esophageal reflux disease without esophagitis: Secondary | ICD-10-CM | POA: Diagnosis not present

## 2023-11-06 DIAGNOSIS — E7800 Pure hypercholesterolemia, unspecified: Secondary | ICD-10-CM | POA: Diagnosis not present

## 2023-11-06 DIAGNOSIS — J439 Emphysema, unspecified: Secondary | ICD-10-CM | POA: Diagnosis not present

## 2023-11-08 ENCOUNTER — Telehealth: Payer: Self-pay

## 2023-11-08 DIAGNOSIS — I700 Atherosclerosis of aorta: Secondary | ICD-10-CM | POA: Diagnosis not present

## 2023-11-08 DIAGNOSIS — E7800 Pure hypercholesterolemia, unspecified: Secondary | ICD-10-CM | POA: Diagnosis not present

## 2023-11-08 DIAGNOSIS — R339 Retention of urine, unspecified: Secondary | ICD-10-CM | POA: Diagnosis not present

## 2023-11-08 DIAGNOSIS — D649 Anemia, unspecified: Secondary | ICD-10-CM | POA: Diagnosis not present

## 2023-11-08 DIAGNOSIS — J9611 Chronic respiratory failure with hypoxia: Secondary | ICD-10-CM | POA: Diagnosis not present

## 2023-11-08 DIAGNOSIS — J439 Emphysema, unspecified: Secondary | ICD-10-CM | POA: Diagnosis not present

## 2023-11-08 DIAGNOSIS — K219 Gastro-esophageal reflux disease without esophagitis: Secondary | ICD-10-CM | POA: Diagnosis not present

## 2023-11-08 DIAGNOSIS — I2510 Atherosclerotic heart disease of native coronary artery without angina pectoris: Secondary | ICD-10-CM | POA: Diagnosis not present

## 2023-11-08 DIAGNOSIS — J9612 Chronic respiratory failure with hypercapnia: Secondary | ICD-10-CM | POA: Diagnosis not present

## 2023-11-08 NOTE — Telephone Encounter (Signed)
 Open in error

## 2023-11-13 DIAGNOSIS — D649 Anemia, unspecified: Secondary | ICD-10-CM | POA: Diagnosis not present

## 2023-11-13 DIAGNOSIS — E7800 Pure hypercholesterolemia, unspecified: Secondary | ICD-10-CM | POA: Diagnosis not present

## 2023-11-13 DIAGNOSIS — J439 Emphysema, unspecified: Secondary | ICD-10-CM | POA: Diagnosis not present

## 2023-11-13 DIAGNOSIS — K219 Gastro-esophageal reflux disease without esophagitis: Secondary | ICD-10-CM | POA: Diagnosis not present

## 2023-11-13 DIAGNOSIS — I700 Atherosclerosis of aorta: Secondary | ICD-10-CM | POA: Diagnosis not present

## 2023-11-13 DIAGNOSIS — I2510 Atherosclerotic heart disease of native coronary artery without angina pectoris: Secondary | ICD-10-CM | POA: Diagnosis not present

## 2023-11-13 DIAGNOSIS — J9612 Chronic respiratory failure with hypercapnia: Secondary | ICD-10-CM | POA: Diagnosis not present

## 2023-11-13 DIAGNOSIS — R339 Retention of urine, unspecified: Secondary | ICD-10-CM | POA: Diagnosis not present

## 2023-11-13 DIAGNOSIS — J9611 Chronic respiratory failure with hypoxia: Secondary | ICD-10-CM | POA: Diagnosis not present

## 2023-11-15 ENCOUNTER — Other Ambulatory Visit (HOSPITAL_BASED_OUTPATIENT_CLINIC_OR_DEPARTMENT_OTHER): Payer: Self-pay

## 2023-12-18 ENCOUNTER — Ambulatory Visit: Payer: Medicare HMO | Admitting: Family Medicine

## 2023-12-30 ENCOUNTER — Other Ambulatory Visit: Payer: Self-pay | Admitting: Pulmonary Disease

## 2024-01-08 ENCOUNTER — Other Ambulatory Visit: Payer: Self-pay | Admitting: Family Medicine

## 2024-01-08 DIAGNOSIS — J3089 Other allergic rhinitis: Secondary | ICD-10-CM

## 2024-01-08 NOTE — Telephone Encounter (Signed)
 Copied from CRM [id number]. Topic: Clinical - Medication Refill >> Jan 08, 2024 10:25 AM Gery Pray wrote: Most Recent Primary Care Visit:  Provider: Hoy Register  Department: [facility]  Visit Type: SAME DAY  Date: 09/18/2023  Medication: loratadine (CLARITIN) 10 MG tablet, fluticasone (FLONASE) 50 MG/ACT nasal spray 2 spray  Has the patient contacted their pharmacy? Yes (Agent: If no, request that the patient contact the pharmacy for the refill. If patient does not wish to contact the pharmacy document the reason why and proceed with request.) (Agent: If yes, when and what did the pharmacy advise?) Alpeta from [facility] stated they have not heard from provider regarding prescription refill after submitting on 03/31  Is this the correct pharmacy for this prescription? Yes If no, delete pharmacy and type the correct one.  This is the patient's preferred pharmacy:  [facility] - [place], [place] - [address] [place] [address] Phone: [phone] Fax: [phone]  Has the prescription been filled recently? No  Is the patient out of the medication? Yes  Has the patient been seen for an appointment in the last year OR does the patient have an upcoming appointment? Yes  Can we respond through MyChart? Yes  Agent: Please be advised that Rx refills may take up to 3 business days. We ask that you follow-up with your pharmacy.

## 2024-01-09 NOTE — Telephone Encounter (Signed)
 Requested medications are due for refill today.  Claritin yes. Flonase unsure  Requested medications are on the active medications list.  Claritin yes - Flonase no  Last refill. Claritin 08/20/2023 #100 1 rf, Flonase unsure  Future visit scheduled.   yes  Notes to clinic.  Pt is requesting refills of both Claritin and Flonase. Flonase is not on med list.    Requested Prescriptions  Pending Prescriptions Disp Refills   loratadine (CLARITIN) 10 MG tablet 100 tablet 1    Sig: Take 1 tablet (10 mg total) by mouth daily.     Ear, Nose, and Throat:  Antihistamines 2 Passed - 01/09/2024  4:16 PM      Passed - Cr in normal range and within 360 days    Creat  Date Value Ref Range Status  09/05/2016 0.60 0.50 - 0.99 mg/dL Final    Comment:      For patients > or = 74 years of age: The upper reference limit for Creatinine is approximately 13% higher for people identified as African-American.      Creatinine, Ser  Date Value Ref Range Status  05/29/2023 0.47 0.44 - 1.00 mg/dL Final         Passed - Valid encounter within last 12 months    Recent Outpatient Visits           3 months ago Vaginal itching   [facility] [place]. [facility] Hoy Register, MD   6 months ago Urinary retention   [facility] [place] [place]. [facility] Hoy Register, MD   11 months ago Paronychia of finger of right hand   [facility] [employer]. [facility] Hoy Register, MD   1 year ago Muscle cramps   [facility] [place] [facility] [place]. [facility] Hoy Register, MD   1 year ago Lumbar spine pain   [facility] [place]. [facility] Hoy Register, MD       Future Appointments             In 4 weeks Hoy Register, MD [facility] [place]. [facility]   In 1  month Bing Matter, Marveen Reeks, MD [facility]

## 2024-01-10 ENCOUNTER — Other Ambulatory Visit (HOSPITAL_BASED_OUTPATIENT_CLINIC_OR_DEPARTMENT_OTHER): Payer: Self-pay

## 2024-01-10 MED ORDER — LORATADINE 10 MG PO TABS
10.0000 mg | ORAL_TABLET | Freq: Every day | ORAL | 0 refills | Status: DC
  Filled 2024-01-10: qty 100, 100d supply, fill #0

## 2024-01-13 ENCOUNTER — Other Ambulatory Visit: Payer: Self-pay | Admitting: Pulmonary Disease

## 2024-01-13 ENCOUNTER — Other Ambulatory Visit (HOSPITAL_BASED_OUTPATIENT_CLINIC_OR_DEPARTMENT_OTHER): Payer: Self-pay

## 2024-01-14 ENCOUNTER — Other Ambulatory Visit (HOSPITAL_BASED_OUTPATIENT_CLINIC_OR_DEPARTMENT_OTHER): Payer: Self-pay

## 2024-01-14 MED ORDER — AZITHROMYCIN 250 MG PO TABS
250.0000 mg | ORAL_TABLET | Freq: Every day | ORAL | 0 refills | Status: DC
  Filled 2024-01-14: qty 30, 30d supply, fill #0

## 2024-02-03 ENCOUNTER — Ambulatory Visit: Admitting: Cardiology

## 2024-02-06 ENCOUNTER — Encounter: Payer: Self-pay | Admitting: Family Medicine

## 2024-02-06 ENCOUNTER — Ambulatory Visit: Attending: Family Medicine | Admitting: Family Medicine

## 2024-02-06 ENCOUNTER — Telehealth: Payer: Self-pay

## 2024-02-06 VITALS — BP 151/73 | HR 93 | Ht 64.0 in

## 2024-02-06 DIAGNOSIS — Z13228 Encounter for screening for other metabolic disorders: Secondary | ICD-10-CM | POA: Diagnosis not present

## 2024-02-06 DIAGNOSIS — Z131 Encounter for screening for diabetes mellitus: Secondary | ICD-10-CM | POA: Diagnosis not present

## 2024-02-06 DIAGNOSIS — J449 Chronic obstructive pulmonary disease, unspecified: Secondary | ICD-10-CM

## 2024-02-06 DIAGNOSIS — R42 Dizziness and giddiness: Secondary | ICD-10-CM | POA: Diagnosis not present

## 2024-02-06 DIAGNOSIS — J302 Other seasonal allergic rhinitis: Secondary | ICD-10-CM

## 2024-02-06 DIAGNOSIS — I2510 Atherosclerotic heart disease of native coronary artery without angina pectoris: Secondary | ICD-10-CM | POA: Diagnosis not present

## 2024-02-06 DIAGNOSIS — J9612 Chronic respiratory failure with hypercapnia: Secondary | ICD-10-CM

## 2024-02-06 DIAGNOSIS — J3089 Other allergic rhinitis: Secondary | ICD-10-CM

## 2024-02-06 DIAGNOSIS — R399 Unspecified symptoms and signs involving the genitourinary system: Secondary | ICD-10-CM

## 2024-02-06 DIAGNOSIS — J9611 Chronic respiratory failure with hypoxia: Secondary | ICD-10-CM | POA: Diagnosis not present

## 2024-02-06 DIAGNOSIS — R921 Mammographic calcification found on diagnostic imaging of breast: Secondary | ICD-10-CM

## 2024-02-06 LAB — POCT URINALYSIS DIP (CLINITEK)
Bilirubin, UA: NEGATIVE
Glucose, UA: NEGATIVE mg/dL
Ketones, POC UA: NEGATIVE mg/dL
Nitrite, UA: NEGATIVE
POC PROTEIN,UA: NEGATIVE
Spec Grav, UA: 1.015 (ref 1.010–1.025)
Urobilinogen, UA: 0.2 U/dL
pH, UA: 7 (ref 5.0–8.0)

## 2024-02-06 MED ORDER — FLUTICASONE PROPIONATE 50 MCG/ACT NA SUSP: 2.0000 | Freq: Every day | NASAL | 6 refills | Status: DC

## 2024-02-06 MED ORDER — LORATADINE 10 MG PO TABS: 10.0000 mg | ORAL_TABLET | Freq: Every day | ORAL | 1 refills | Status: DC

## 2024-02-06 NOTE — Patient Instructions (Signed)
 VISIT SUMMARY:  Today, you were seen for vertigo, thyroid  issues, urinary symptoms, COPD, hypertension, and acid reflux. We discussed your current medications, recent symptoms, and necessary follow-ups.  YOUR PLAN:  -COPD WITH OXYGEN  DEPENDENCE: COPD is a chronic lung condition that makes it hard to breathe. Your COPD is well-controlled with the Breo Ellipta  inhaler, and you should continue using it. Please follow up with your pulmonologist as scheduled and get a CT scan as per their recommendation.  -HYPERTENSION: Hypertension is high blood pressure. Your blood pressure was elevated today, possibly due to recent activity and a headache. We will repeat the measurement before you leave the clinic.  -VERTIGO: Vertigo is a sensation of spinning dizziness. You experience this when lying down and turning your head. We will refer you to home health for vestibular rehabilitation exercises, and you should change positions slowly to prevent dizziness and falls.  -URINARY SYMPTOMS: You have been experiencing occasional pain and difficulty urinating. We have collected a urine sample for analysis to determine the cause.  -HIATAL HERNIA: A hiatal hernia occurs when part of the stomach pushes up through the diaphragm, causing acid reflux and burping. If your symptoms worsen, we may need to re-evaluate your condition.  -LEFT BREAST CALCIFICATION: Calcifications in the breast are small calcium  deposits that can be seen on a mammogram. You had a diagnostic mammogram in December 2024, and we recommend a follow-up mammogram in June 2025.  -GENERAL HEALTH MAINTENANCE: We will order blood work to check your thyroid , kidney, liver function, anemia, cholesterol, and diabetes screening, as it has been a while since your last tests.  INSTRUCTIONS:  Please follow up with your pulmonologist as scheduled and get a CT scan as per their recommendation. We will repeat your blood pressure measurement before you leave the  clinic. You will be referred to home health for vestibular rehabilitation exercises. If your acid reflux symptoms worsen, please let us  know for a re-evaluation. A follow-up mammogram is recommended in June 2025. We will also order blood work to check your thyroid , kidney, liver function, anemia, cholesterol, and diabetes screening.

## 2024-02-06 NOTE — Telephone Encounter (Signed)
 Referral received  for home health PT.  I called the patient to inquire if she has a preference for home health agencies and she stated she prefers [facility].  I told her that I will check with [facility] first and if they are not able to accept the referral, I will try other agencies.  I also explained that there is no guarantee that an agency will accept the referral because they need to be in network with her insurance and have staff available. She said she understood.  I sent a message to Genna Khan, [facility] requesting he review the referral and let me know if they can accept it.

## 2024-02-06 NOTE — Progress Notes (Signed)
 Subjective:  Patient ID: Margaret Henry, female    DOB: 07-Nov-1950  Age: 73 y.o. MRN: [id number]  CC: Medical Management of Chronic Issues (Vertigo/Off balance)     Discussed the use of AI scribe software for clinical note transcription with the patient, who gave verbal consent to proceed.  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  She experiences vertigo, especially when lying down and turning her head to the left, causing the room to spin. This has been a recurrent issue for many years. She previously completed vestibular exercises with home physical therapy. Recently, she lost her balance, hit a door jamb, and sustained a bruise.  She complains of cold intolerance and states she previously had a thyroid  condition but her last TSH was normal. She describes an intermittent knot in her neck that she can feel but not see.  She experiences occasional dysuria and the need to force urination despite urgency.  She uses a walker at home but not in her bedroom due to space constraints. She takes Claritin  for pollen allergies and has requested Flonase . She has COPD and uses 3.5 liters of oxygen . She finds the Breo inhaler effective after trying other medications that worsened her symptoms.  Currently under pulmonary care.  She has a hiatal hernia diagnosed via chest CT, which exacerbates her acid reflux symptoms, causing frequent burping. She is not currently taking medication for acid reflux even the Protonix  appears on her med list but she states she does not need it.    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)   Anal intraepithelial neoplasia II (AIN II) 2017    Anemia 04/06/22   Aortic atherosclerosis (HCC)    B12 deficiency    Blood transfusion without reported diagnosis 6/23   Bronchitis    Cancer (HCC)    skin cancer on chest   Cataract 5/23   Chest pain 06/12/2016   Cholelithiasis    Colon polyps    Complication of anesthesia 05/16/2021   pt states was given too much during nasal surgery 1989; difficulty getting awake   COPD (chronic obstructive pulmonary disease) (HCC)    Coronary artery calcification    Diverticulitis 05/16/2021   occasional   Diverticulosis    Dyspnea 06/27/2016   Emphysema of lung (HCC)    External hemorrhoids    GERD (gastroesophageal reflux disease)    occasional   Hemorrhoids    High cholesterol    Hyperlipidemia LDL goal <70    Iron deficiency    Mild aortic insufficiency    Neuropathy 04/07/2022   Osteoporosis    Oxygen  deficiency    Pneumonia    Shortness of breath dyspnea    with exertion    Thyroid  goiter    bx benign   Tobacco abuse    Vertigo 04/21/2017    Past Surgical History:  Procedure Laterality Date   ABDOMINAL HYSTERECTOMY     APPENDECTOMY  1973   BILATERAL OOPHORECTOMY  10/09/1999   for benign ovarian mass    BIOPSY THYROID      DENTAL SURGERY     dentures   NASAL SEPTUM SURGERY     PARTIAL  HYSTERECTOMY  10/08/1974   for heavy menses    RECTAL EXAM UNDER ANESTHESIA N/A 12/14/2015   Procedure: RECTAL EXAM UNDER ANESTHESIA REMOVAL OF ANAL CANAL MASS; INTERNAL HEMORRHOID LIGATION, EXTERNAL HEMORRHOID LIGATION;  Surgeon: Candyce Champagne, MD;  Location: [facility];  Service: General;  Laterality: N/A;   TUBAL LIGATION     WISDOM TOOTH EXTRACTION      Family History  Problem Relation Age of Onset   Heart disease Mother    Alcohol abuse Mother    Asthma Mother    COPD Father    Heart disease Father    Lung cancer Maternal Grandfather    Cancer Maternal Grandfather    Liver cancer Paternal Grandmother    Cancer Paternal Grandmother    Diabetes Paternal Aunt    Colon cancer  Neg Hx    Colon polyps Neg Hx    Esophageal cancer Neg Hx    Rectal cancer Neg Hx    Stomach cancer Neg Hx     Social History   Socioeconomic History   Marital status: Widowed    Spouse name: Not on file   Number of children: Not on file   Years of education: Not on file   Highest education level: 9th grade  Occupational History   Not on file  Tobacco Use   Smoking status: Former    Current packs/day: 0.00    Average packs/day: 1.5 packs/day for 50.0 years (75.0 ttl pk-yrs)    Types: Cigarettes    Start date: 11/03/1968    Quit date: 11/03/2018    Years since quitting: 5.2   Smokeless tobacco: Never  Vaping Use   Vaping status: Never Used  Substance and Sexual Activity   Alcohol use: No   Drug use: No   Sexual activity: Never  Other Topics Concern   Not on file  Social History Narrative   Not on file   Social Drivers of Health   Financial Resource Strain: Low Risk  (02/02/2024)   Overall Financial Resource Strain (CARDIA)    Difficulty of Paying Living Expenses: Not hard at all  Food Insecurity: No Food Insecurity (02/02/2024)   Hunger Vital Sign    Worried About Running Out of Food in the Last Year: Never true    Ran Out of Food in the Last Year: Never true  Transportation Needs: No Transportation Needs (02/02/2024)   PRAPARE - Administrator, Civil Service (Medical): No    Lack of Transportation (Non-Medical): No  Physical Activity: Insufficiently Active (02/02/2024)   Exercise Vital Sign    Days of Exercise per Week: 3 days    Minutes of Exercise per Session: 10 min  Stress: No Stress Concern Present (02/02/2024)   [employer] of Occupational Health - Occupational Stress Questionnaire    Feeling of Stress : Not at all  Social Connections: Socially Isolated (02/02/2024)   Social Connection and Isolation Panel [NHANES]    Frequency of Communication with Friends and Family: More than three times a week    Frequency of Social Gatherings with Friends  and Family: Twice a week    Attends Religious Services: Never    Database administrator or Organizations: No    Attends Banker Meetings: Never    Marital Status: Widowed    Allergies  Allergen Reactions   Hydrocodone  Nausea And Vomiting   Propoxyphene N-Acetaminophen  Nausea And Vomiting   Augmentin  [Amoxicillin -Pot Clavulanate] Rash    Outpatient Medications Prior to Visit  Medication  Sig Dispense Refill   albuterol  (PROVENTIL ) (2.5 MG/3ML) 0.083% nebulizer solution Take 2.5 mg by nebulization every 6 (six) hours as needed for wheezing or shortness of breath.     atorvastatin  (LIPITOR) 40 MG tablet TAKE 1 TABLET EVERY DAY 90 tablet 3   azithromycin  (ZITHROMAX ) 250 MG tablet Take 1 tablet (250 mg total) by mouth daily. 30 tablet 0   bismuth subsalicylate (PEPTO BISMOL) 262 MG/15ML suspension Take 30 mLs by mouth every 6 (six) hours as needed for indigestion.     BREO ELLIPTA  200-25 MCG/ACT AEPB INHALE 1 PUFF EVERY DAY 180 each 3   loratadine  (CLARITIN ) 10 MG tablet Take 1 tablet (10 mg total) by mouth daily. *Please keep upcoming appt for additional refills.* 100 tablet 0   nitroGLYCERIN  (NITROSTAT ) 0.4 MG SL tablet Place 1 tablet (0.4 mg total) under the tongue every 5 (five) minutes as needed for chest pain. 25 tablet 6   OXYGEN  Inhale 3 L/min into the lungs continuous.     SPIRIVA  RESPIMAT 2.5 MCG/ACT AERS INHALE 2 PUFFS EVERY DAY 12 g 3   vitamin B-12 (CYANOCOBALAMIN ) 100 MCG tablet Take 100 mcg by mouth daily.     clotrimazole  (LOTRIMIN ) 1 % cream Apply 1 Application topically 2 (two) times daily. (Patient not taking: Reported on 02/06/2024) 60 g 0   ferrous sulfate  325 (65 FE) MG EC tablet Take 325 mg by mouth every other day. Pt states she is only taking once a week due to constipation (Patient not taking: Reported on 02/06/2024)     lidocaine  (LIDODERM ) 5 % Place 1 patch onto the skin daily. Remove & Discard patch within 12 hours or as directed by MD (Patient not taking:  Reported on 05/27/2023) 30 patch 0   mupirocin  ointment (BACTROBAN ) 2 % Apply 1 Application topically 2 (two) times daily. To affected nostril (Patient not taking: Reported on 02/06/2024) 22 g 0   nitrofurantoin  (MACRODANTIN ) 100 MG capsule Take 1 capsule (100 mg total) by mouth 2 (two) times daily. (Patient not taking: Reported on 02/06/2024) 10 capsule 0   pantoprazole  (PROTONIX ) 20 MG tablet Take 1 tablet (20 mg total) by mouth daily. (Patient not taking: Reported on 06/20/2023) 90 tablet 2   predniSONE  (DELTASONE ) 10 MG tablet Take 1 tablet (10 mg total) by mouth daily with breakfast. (Patient not taking: Reported on 05/27/2023) 30 tablet 1   terbinafine  (LAMISIL ) 250 MG tablet Take 1 tablet (250 mg total) by mouth daily. 90 tablet 0   No facility-administered medications prior to visit.     ROS Review of Systems  Constitutional:  Negative for activity change and appetite change.  HENT:  Negative for sinus pressure and sore throat.   Respiratory:  Negative for chest tightness, shortness of breath and wheezing.   Cardiovascular:  Negative for chest pain and palpitations.  Gastrointestinal:  Negative for abdominal distention, abdominal pain and constipation.  Endocrine: Positive for cold intolerance.  Genitourinary: Negative.   Musculoskeletal:  Positive for gait problem.  Neurological:  Positive for dizziness and headaches.  Hematological:  Bruises/bleeds easily.  Psychiatric/Behavioral:  Negative for behavioral problems and dysphoric mood.     Objective:  BP (!) 167/65   Pulse 93   Ht 5\' 4"  (1.626 m)   SpO2 91%   BMI 17.78 kg/m      02/06/2024   11:12 AM 09/18/2023    3:35 PM 06/20/2023    9:41 AM  BP/Weight  Systolic BP 167 123 124  Diastolic BP 65 68 70  Physical Exam Constitutional:      Appearance: She is well-developed.  HENT:     Right Ear: There is impacted cerumen (minimal cerumen).     Left Ear: There is no impacted cerumen.  Cardiovascular:     Rate and  Rhythm: Normal rate.     Heart sounds: Normal heart sounds. No murmur heard. Pulmonary:     Effort: Pulmonary effort is normal.     Breath sounds: Normal breath sounds. No wheezing or rales.  Chest:     Chest wall: No tenderness.  Abdominal:     General: Bowel sounds are normal. There is no distension.     Palpations: Abdomen is soft. There is no mass.     Tenderness: There is no abdominal tenderness.  Musculoskeletal:        General: Normal range of motion.     Right lower leg: No edema.     Left lower leg: No edema.  Skin:    Findings: Bruising present.  Neurological:     Mental Status: She is alert and oriented to person, place, and time.  Psychiatric:        Mood and Affect: Mood normal.        Latest Ref Rng & Units 05/29/2023   11:28 AM 05/22/2023    4:24 AM 05/21/2023    4:16 PM  CMP  Glucose 70 - 99 mg/dL 89  627  035   BUN 8 - 23 mg/dL 11  7  11    Creatinine 0.44 - 1.00 mg/dL 0.09  3.81  8.29   Sodium 135 - 145 mmol/L 140  139  138   Potassium 3.5 - 5.1 mmol/L 3.8  3.9  3.4   Chloride 98 - 111 mmol/L 103  107  103   CO2 22 - 32 mmol/L 29  24  25    Calcium  8.9 - 10.3 mg/dL 8.8  8.9  9.2   Total Protein 6.5 - 8.1 g/dL  6.4  8.0   Total Bilirubin 0.3 - 1.2 mg/dL  0.6  0.8   Alkaline Phos 38 - 126 U/L  56  72   AST 15 - 41 U/L  17  20   ALT 0 - 44 U/L  10  13     Lipid Panel     Component Value Date/Time   CHOL 143 01/30/2022 1158   CHOL 147 02/15/2021 1139   TRIG 132.0 01/30/2022 1158   HDL 74.30 01/30/2022 1158   HDL 58 02/15/2021 1139   CHOLHDL 2 01/30/2022 1158   VLDL 26.4 01/30/2022 1158   LDLCALC 42 01/30/2022 1158   LDLCALC 70 02/15/2021 1139   LDLDIRECT 166 (H) 09/26/2015 1001    CBC    Component Value Date/Time   WBC 11.1 (H) 05/23/2023 1216   RBC 3.85 (L) 05/23/2023 1216   HGB 11.3 (L) 05/23/2023 1216   HGB 12.1 01/03/2023 1352   HCT 36.6 05/23/2023 1216   HCT 37.0 01/03/2023 1352   PLT 210 05/23/2023 1216   PLT 271 01/03/2023 1352    MCV 95.1 05/23/2023 1216   MCV 97 01/03/2023 1352   MCH 29.4 05/23/2023 1216   MCHC 30.9 05/23/2023 1216   RDW 14.0 05/23/2023 1216   RDW 13.1 01/03/2023 1352   LYMPHSABS 1.1 04/22/2023 1135   LYMPHSABS 1.3 01/03/2023 1352   MONOABS 0.9 04/22/2023 1135   EOSABS 0.3 04/22/2023 1135   EOSABS 0.3 01/03/2023 1352   BASOSABS 0.1 04/22/2023 1135   BASOSABS 0.1 01/03/2023  1352    Lab Results  Component Value Date   HGBA1C 5.5 07/04/2022    Lab Results  Component Value Date   TSH 0.51 04/22/2023       Assessment & Plan COPD with oxygen  dependence COPD well-controlled with Breo Ellipta . Variable symptoms, uses 3.5 L/min oxygen . Ongoing pulmonologist follow-up. - Continue Breo Ellipta  inhaler. - Follow up with pulmonologist. - Order CT scan per pulmonologist's schedule.  Hypertension Elevated blood pressure, possibly due to recent activity and headache. Previously normal. - Repeat blood pressure measurement before leaving clinic. - Make no regimen changes as her blood pressures have usually been in the 120s.  Vertigo Intermittent vertigo with head movement, especially when lying down. Previous vestibular rehab beneficial. Meclizine  not preferred due to age and sedation. Recent fall due to balance issues. - Refer to home health for vestibular rehabilitation. - Advise slow position changes to prevent dizziness and falls.  Urinary symptoms Intermittent dysuria and difficulty urinating. Urine sample collected. - Send urine specimen for analysis. - UA revealed trace blood, 1+ leukocyte esterase  Left breast calcification Calcifications in left breast. Previous diagnostic mammogram in December 2024. Follow-up recommended in June 2025. - Order bilateral diagnostic mammogram for June 2025.  General Health Maintenance - Order blood work for thyroid , kidney, liver function, anemia, cholesterol, and diabetes screening.      No orders of the defined types were placed in this  encounter.   Follow-up: No follow-ups on file.       Joaquin Mulberry, MD, FAAFP. [facility] [place], [place] [phone]   02/06/2024, 11:18 AM

## 2024-02-07 ENCOUNTER — Encounter: Payer: Self-pay | Admitting: Family Medicine

## 2024-02-07 LAB — CMP14+EGFR
ALT: 8 IU/L (ref 0–32)
AST: 16 IU/L (ref 0–40)
Albumin: 4.6 g/dL (ref 3.8–4.8)
Alkaline Phosphatase: 90 IU/L (ref 44–121)
BUN/Creatinine Ratio: 18 (ref 12–28)
BUN: 9 mg/dL (ref 8–27)
Bilirubin Total: 0.3 mg/dL (ref 0.0–1.2)
CO2: 25 mmol/L (ref 20–29)
Calcium: 9.5 mg/dL (ref 8.7–10.3)
Chloride: 103 mmol/L (ref 96–106)
Creatinine, Ser: 0.51 mg/dL — ABNORMAL LOW (ref 0.57–1.00)
Globulin, Total: 2.5 g/dL (ref 1.5–4.5)
Glucose: 88 mg/dL (ref 70–99)
Potassium: 4.4 mmol/L (ref 3.5–5.2)
Sodium: 142 mmol/L (ref 134–144)
Total Protein: 7.1 g/dL (ref 6.0–8.5)
eGFR: 98 mL/min/{1.73_m2} (ref >59)

## 2024-02-07 LAB — CBC WITH DIFFERENTIAL/PLATELET
Basophils Absolute: 0.1 10*3/uL (ref 0.0–0.2)
Basos: 1 %
EOS (ABSOLUTE): 0.3 10*3/uL (ref 0.0–0.4)
Eos: 3 %
Hematocrit: 38 % (ref 34.0–46.6)
Hemoglobin: 12.2 g/dL (ref 11.1–15.9)
Immature Grans (Abs): 0 10*3/uL (ref 0.0–0.1)
Immature Granulocytes: 0 %
Lymphocytes Absolute: 1.3 10*3/uL (ref 0.7–3.1)
Lymphs: 15 %
MCH: 30.8 pg (ref 26.6–33.0)
MCHC: 32.1 g/dL (ref 31.5–35.7)
MCV: 96 fL (ref 79–97)
Monocytes Absolute: 0.7 10*3/uL (ref 0.1–0.9)
Monocytes: 8 %
Neutrophils Absolute: 6.4 10*3/uL (ref 1.4–7.0)
Neutrophils: 73 %
Platelets: 299 10*3/uL (ref 150–450)
RBC: 3.96 x10E6/uL (ref 3.77–5.28)
RDW: 13.1 % (ref 11.7–15.4)
WBC: 8.9 10*3/uL (ref 3.4–10.8)

## 2024-02-07 LAB — TSH: TSH: 0.802 u[IU]/mL (ref 0.450–4.500)

## 2024-02-07 LAB — LP+NON-HDL CHOLESTEROL
Cholesterol, Total: 149 mg/dL (ref 100–199)
HDL: 58 mg/dL (ref >39)
LDL Chol Calc (NIH): 68 mg/dL (ref 0–99)
Total Non-HDL-Chol (LDL+VLDL): 91 mg/dL (ref 0–129)
Triglycerides: 133 mg/dL (ref 0–149)
VLDL Cholesterol Cal: 23 mg/dL (ref 5–40)

## 2024-02-07 LAB — T3: T3, Total: 164 ng/dL (ref 71–180)

## 2024-02-07 LAB — HEMOGLOBIN A1C
Est. average glucose Bld gHb Est-mCnc: 103 mg/dL
Hgb A1c MFr Bld: 5.2 % (ref 4.8–5.6)

## 2024-02-07 LAB — T4, FREE: Free T4: 1.09 ng/dL (ref 0.82–1.77)

## 2024-02-08 LAB — URINE CULTURE

## 2024-02-10 NOTE — Telephone Encounter (Signed)
 Message received from Genna Khan, [facility] stating they will accept the referral

## 2024-02-11 ENCOUNTER — Encounter: Payer: Self-pay | Admitting: Cardiology

## 2024-02-11 ENCOUNTER — Ambulatory Visit: Attending: Cardiology | Admitting: Cardiology

## 2024-02-11 VITALS — BP 146/70 | HR 88 | Ht 64.0 in | Wt 105.0 lb

## 2024-02-11 DIAGNOSIS — J410 Simple chronic bronchitis: Secondary | ICD-10-CM

## 2024-02-11 DIAGNOSIS — R0609 Other forms of dyspnea: Secondary | ICD-10-CM

## 2024-02-11 DIAGNOSIS — I2510 Atherosclerotic heart disease of native coronary artery without angina pectoris: Secondary | ICD-10-CM | POA: Diagnosis not present

## 2024-02-11 DIAGNOSIS — I351 Nonrheumatic aortic (valve) insufficiency: Secondary | ICD-10-CM

## 2024-02-11 NOTE — Progress Notes (Signed)
 Cardiology Office Note:    Date:  02/11/2024   ID:  OLUWAKEMI STARKE, DOB January 07, 1951, MRN [id number]  PCP:  Joaquin Mulberry, MD  Cardiologist:  Ralene Burger, MD    Referring MD: Joaquin Mulberry, MD   Chief Complaint  Patient presents with   Follow-up    History of Present Illness:    SEANTE FARD is a 73 y.o. female past medical history significant for advanced COPD she quit smoking 2019 she is on oxygen  and steroid-dependent, she was diagnosed with calcification of the coronary artery initially in 2019 after the stress test was done which was negative she did have aortic valve insufficiency but only mild.  She comes today to months for follow-up cardiac wise doing well denies have any cardiac complaints there is no chest pain tightness squeezing pressure burning chest.  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)   Anal intraepithelial neoplasia II (AIN II) 2017   Anemia 04/06/22   Aortic atherosclerosis (HCC)    B12 deficiency    Blood transfusion without reported diagnosis 6/23   Bronchitis    Cancer (HCC)    skin cancer on chest   Cataract 5/23   Chest pain 06/12/2016   Cholelithiasis    Colon polyps    Complication of anesthesia 05/16/2021   pt states was given too much during nasal surgery 1989; difficulty getting awake   COPD (chronic obstructive pulmonary disease) (HCC)    Coronary artery calcification    Diverticulitis 05/16/2021   occasional   Diverticulosis    Dyspnea 06/27/2016   Emphysema of lung (HCC)    External hemorrhoids    GERD (gastroesophageal reflux disease)    occasional   Hemorrhoids    High cholesterol    Hyperlipidemia LDL goal <70    Iron deficiency    Mild aortic insufficiency    Neuropathy 04/07/2022   Osteoporosis    Oxygen  deficiency     Pneumonia    Shortness of breath dyspnea    with exertion    Thyroid  goiter    bx benign   Tobacco abuse    Vertigo 04/21/2017    Past Surgical History:  Procedure Laterality Date   ABDOMINAL HYSTERECTOMY     APPENDECTOMY  1973   BILATERAL OOPHORECTOMY  10/09/1999   for benign ovarian mass    BIOPSY THYROID      DENTAL SURGERY     dentures   NASAL SEPTUM SURGERY     PARTIAL HYSTERECTOMY  10/08/1974   for heavy menses    RECTAL EXAM UNDER ANESTHESIA N/A 12/14/2015   Procedure: RECTAL EXAM UNDER ANESTHESIA REMOVAL OF ANAL CANAL MASS; INTERNAL HEMORRHOID LIGATION, EXTERNAL HEMORRHOID LIGATION;  Surgeon: Candyce Champagne, MD;  Location: [facility];  Service: General;  Laterality: N/A;   TUBAL LIGATION     WISDOM TOOTH EXTRACTION      Current Medications: Current Meds  Medication Sig   albuterol  (PROVENTIL ) (2.5 MG/3ML) 0.083% nebulizer solution Take 2.5 mg by nebulization every 6 (six) hours as needed for wheezing or shortness of breath.   atorvastatin  (LIPITOR) 40 MG tablet TAKE 1 TABLET EVERY DAY (Patient taking differently: Take 40 mg by mouth daily.)   azithromycin  (ZITHROMAX ) 250 MG tablet Take 1 tablet (250 mg total) by mouth daily.   bismuth subsalicylate (PEPTO BISMOL)  262 MG/15ML suspension Take 30 mLs by mouth every 6 (six) hours as needed for indigestion.   BREO ELLIPTA  200-25 MCG/ACT AEPB INHALE 1 PUFF EVERY DAY (Patient taking differently: Inhale 1 puff into the lungs daily.)   ferrous sulfate  325 (65 FE) MG EC tablet Take 325 mg by mouth every other day. Pt states she is only taking once a week due to constipation   fluticasone  (FLONASE ) 50 MCG/ACT nasal spray Place 2 sprays into both nostrils daily.   loratadine  (CLARITIN ) 10 MG tablet Take 1 tablet (10 mg total) by mouth daily. *Please keep upcoming appt for additional refills.*   nitroGLYCERIN  (NITROSTAT ) 0.4 MG SL tablet Place 1 tablet (0.4 mg total) under the tongue every 5 (five) minutes as needed for chest pain.    OXYGEN  Inhale 3 L/min into the lungs continuous.   predniSONE  (DELTASONE ) 10 MG tablet Take 1 tablet (10 mg total) by mouth daily with breakfast.   SPIRIVA  RESPIMAT 2.5 MCG/ACT AERS INHALE 2 PUFFS EVERY DAY (Patient taking differently: Inhale 2 puffs into the lungs daily.)   vitamin B-12 (CYANOCOBALAMIN ) 100 MCG tablet Take 100 mcg by mouth daily.     Allergies:   Hydrocodone , Propoxyphene n-acetaminophen , and Augmentin  [amoxicillin -pot clavulanate]   Social History   Socioeconomic History   Marital status: Widowed    Spouse name: Not on file   Number of children: Not on file   Years of education: Not on file   Highest education level: 9th grade  Occupational History   Not on file  Tobacco Use   Smoking status: Former    Current packs/day: 0.00    Average packs/day: 1.5 packs/day for 50.0 years (75.0 ttl pk-yrs)    Types: Cigarettes    Start date: 11/03/1968    Quit date: 11/03/2018    Years since quitting: 5.2   Smokeless tobacco: Never  Vaping Use   Vaping status: Never Used  Substance and Sexual Activity   Alcohol use: No   Drug use: No   Sexual activity: Never  Other Topics Concern   Not on file  Social History Narrative   Not on file   Social Drivers of Health   Financial Resource Strain: Low Risk  (02/02/2024)   Overall Financial Resource Strain (CARDIA)    Difficulty of Paying Living Expenses: Not hard at all  Food Insecurity: No Food Insecurity (02/02/2024)   Hunger Vital Sign    Worried About Running Out of Food in the Last Year: Never true    Ran Out of Food in the Last Year: Never true  Transportation Needs: No Transportation Needs (02/02/2024)   PRAPARE - Administrator, Civil Service (Medical): No    Lack of Transportation (Non-Medical): No  Physical Activity: Insufficiently Active (02/02/2024)   Exercise Vital Sign    Days of Exercise per Week: 3 days    Minutes of Exercise per Session: 10 min  Stress: No Stress Concern Present (02/02/2024)    [employer] of Occupational Health - Occupational Stress Questionnaire    Feeling of Stress : Not at all  Social Connections: Socially Isolated (02/02/2024)   Social Connection and Isolation Panel [NHANES]    Frequency of Communication with Friends and Family: More than three times a week    Frequency of Social Gatherings with Friends and Family: Twice a week    Attends Religious Services: Never    Database administrator or Organizations: No    Attends Banker Meetings: Never  Marital Status: Widowed     Family History: The patient's family history includes Alcohol abuse in her mother; Asthma in her mother; COPD in her father; Cancer in her maternal grandfather and paternal grandmother; Diabetes in her paternal aunt; Heart disease in her father and mother; Liver cancer in her paternal grandmother; Lung cancer in her maternal grandfather. There is no history of Colon cancer, Colon polyps, Esophageal cancer, Rectal cancer, or Stomach cancer. ROS:   Please see the history of present illness.    All 14 point review of systems negative except as described per history of present illness  EKGs/Labs/Other Studies Reviewed:    EKG Interpretation Date/Time:  Tuesday Feb 11 2024 10:09:50 EDT Ventricular Rate:  89 PR Interval:  176 QRS Duration:  100 QT Interval:  376 QTC Calculation: 457 R Axis:   -13  Text Interpretation: Normal sinus rhythm Septal infarct , age undetermined When compared with ECG of 21-May-2023 20:27, PREVIOUS ECG IS PRESENT Confirmed by Ralene Burger [phone]) on 02/11/2024 10:14:39 AM    Recent Labs: 05/21/2023: B Natriuretic Peptide 61.4 05/22/2023: Magnesium 2.0 02/06/2024: ALT 8; BUN 9; Creatinine, Ser 0.51; Hemoglobin 12.2; Platelets 299; Potassium 4.4; Sodium 142; TSH 0.802  Recent Lipid Panel    Component Value Date/Time   CHOL 149 02/06/2024 1202   TRIG 133 02/06/2024 1202   HDL 58 02/06/2024 1202   CHOLHDL 2 01/30/2022 1158   VLDL 26.4  01/30/2022 1158   LDLCALC 68 02/06/2024 1202   LDLDIRECT 166 (H) 09/26/2015 1001    Physical Exam:    VS:  BP (!) 146/70 (BP Location: Left Arm, Patient Position: Sitting)   Pulse 88   Ht 5\' 4"  (1.626 m)   Wt 105 lb (47.6 kg)   SpO2 95%   BMI 18.02 kg/m     Wt Readings from Last 3 Encounters:  02/11/24 105 lb (47.6 kg)  06/20/23 103 lb 9.6 oz (47 kg)  05/29/23 103 lb 9.9 oz (47 kg)     GEN:  Well nourished, well developed in no acute distress HEENT: Normal NECK: No JVD; No carotid bruits LYMPHATICS: No lymphadenopathy CARDIAC: RRR, no murmurs, no rubs, no gallops RESPIRATORY:  Clear to auscultation without rales, wheezing or rhonchi poor air entry bilaterally ABDOMEN: Soft, non-tender, non-distended MUSCULOSKELETAL:  No edema; No deformity  SKIN: Warm and dry LOWER EXTREMITIES: no swelling NEUROLOGIC:  Alert and oriented x 3 PSYCHIATRIC:  Normal affect   ASSESSMENT:    1. Dyspnea on exertion   2. Coronary arteriosclerosis   3. Mild aortic insufficiency   4. Simple chronic bronchitis (HCC)    PLAN:    In order of problems listed above:  Dyspnea exertion most likely related to COPD.  Cardiac wise stable. Coronary atherosclerosis no signs and symptoms of reactivation of the problem on appropriate guideline directed medical therapy. Mild aortic valve insufficiency, dynamically stable continue present management. Dyslipidemia I did review K PN which show me LDL 68 HDL 58.  Continue present management   Medication Adjustments/Labs and Tests Ordered: Current medicines are reviewed at length with the patient today.  Concerns regarding medicines are outlined above.  Orders Placed This Encounter  Procedures   EKG 12-Lead   Medication changes: No orders of the defined types were placed in this encounter.   Signed, Manfred Seed, MD, [facility] 02/11/2024 10:20 AM    [facility]

## 2024-02-11 NOTE — Patient Instructions (Signed)
 Medication Instructions:  Your physician recommends that you continue on your current medications as directed. Please refer to the Current Medication list given to you today.  *If you need a refill on your cardiac medications before your next appointment, please call your pharmacy*   Lab Work: None Ordered If you have labs (blood work) drawn today and your tests are completely normal, you will receive your results only by: MyChart Message (if you have MyChart) OR A paper copy in the mail If you have any lab test that is abnormal or we need to change your treatment, we will call you to review the results.   Testing/Procedures: None Ordered   Follow-Up: At [facility], you and your health needs are our priority.  As part of our continuing mission to provide you with exceptional heart care, we have created designated Provider Care Teams.  These Care Teams include your primary Cardiologist (physician) and Advanced Practice Providers (APPs -  Physician Assistants and Nurse Practitioners) who all work together to provide you with the care you need, when you need it.  We recommend signing up for the patient portal called "MyChart".  Sign up information is provided on this After Visit Summary.  MyChart is used to connect with patients for Virtual Visits (Telemedicine).  Patients are able to view lab/test results, encounter notes, upcoming appointments, etc.  Non-urgent messages can be sent to your provider as well.   To learn more about what you can do with MyChart, go to [email or web address].    Your next appointment:   12 month(s)  The format for your next appointment:   In Person  Provider:   Gypsy Balsam, MD    Other Instructions NA

## 2024-02-12 DIAGNOSIS — I10 Essential (primary) hypertension: Secondary | ICD-10-CM | POA: Diagnosis not present

## 2024-02-12 DIAGNOSIS — G629 Polyneuropathy, unspecified: Secondary | ICD-10-CM | POA: Diagnosis not present

## 2024-02-12 DIAGNOSIS — J9612 Chronic respiratory failure with hypercapnia: Secondary | ICD-10-CM | POA: Diagnosis not present

## 2024-02-12 DIAGNOSIS — I2584 Coronary atherosclerosis due to calcified coronary lesion: Secondary | ICD-10-CM | POA: Diagnosis not present

## 2024-02-12 DIAGNOSIS — I2510 Atherosclerotic heart disease of native coronary artery without angina pectoris: Secondary | ICD-10-CM | POA: Diagnosis not present

## 2024-02-12 DIAGNOSIS — M810 Age-related osteoporosis without current pathological fracture: Secondary | ICD-10-CM | POA: Diagnosis not present

## 2024-02-12 DIAGNOSIS — J449 Chronic obstructive pulmonary disease, unspecified: Secondary | ICD-10-CM | POA: Diagnosis not present

## 2024-02-12 DIAGNOSIS — J9611 Chronic respiratory failure with hypoxia: Secondary | ICD-10-CM | POA: Diagnosis not present

## 2024-02-12 DIAGNOSIS — E46 Unspecified protein-calorie malnutrition: Secondary | ICD-10-CM | POA: Diagnosis not present

## 2024-02-25 ENCOUNTER — Telehealth: Payer: Self-pay | Admitting: Family Medicine

## 2024-02-25 NOTE — Telephone Encounter (Signed)
 Copied from CRM [id number]. Topic: Clinical - Home Health Verbal Orders >> Feb 12, 2024  2:24 PM Phil Braun wrote:  Caller/Agency: [facility] Callback Number: [phone] Service Requested: Physical Therapy Frequency: 2 times week for 3 weeks, 1 time a week for 5 weeks, add home health aide, 1 time for 4 weeks Any new concerns about the patient? No  >> Feb 25, 2024  2:40 PM Juluis Ok wrote: Michel Agreste with [facility] calling to check the status of home health orders. Order is being faxed as well.

## 2024-02-26 NOTE — Telephone Encounter (Signed)
LVM for return phone call.

## 2024-02-27 DIAGNOSIS — J449 Chronic obstructive pulmonary disease, unspecified: Secondary | ICD-10-CM | POA: Diagnosis not present

## 2024-02-27 DIAGNOSIS — J9611 Chronic respiratory failure with hypoxia: Secondary | ICD-10-CM | POA: Diagnosis not present

## 2024-02-27 DIAGNOSIS — G629 Polyneuropathy, unspecified: Secondary | ICD-10-CM | POA: Diagnosis not present

## 2024-02-27 DIAGNOSIS — I10 Essential (primary) hypertension: Secondary | ICD-10-CM | POA: Diagnosis not present

## 2024-02-27 DIAGNOSIS — J9612 Chronic respiratory failure with hypercapnia: Secondary | ICD-10-CM | POA: Diagnosis not present

## 2024-02-27 DIAGNOSIS — I2584 Coronary atherosclerosis due to calcified coronary lesion: Secondary | ICD-10-CM | POA: Diagnosis not present

## 2024-02-27 DIAGNOSIS — M810 Age-related osteoporosis without current pathological fracture: Secondary | ICD-10-CM | POA: Diagnosis not present

## 2024-02-27 DIAGNOSIS — I2510 Atherosclerotic heart disease of native coronary artery without angina pectoris: Secondary | ICD-10-CM | POA: Diagnosis not present

## 2024-02-27 DIAGNOSIS — E46 Unspecified protein-calorie malnutrition: Secondary | ICD-10-CM | POA: Diagnosis not present

## 2024-03-03 DIAGNOSIS — M810 Age-related osteoporosis without current pathological fracture: Secondary | ICD-10-CM | POA: Diagnosis not present

## 2024-03-03 DIAGNOSIS — J9612 Chronic respiratory failure with hypercapnia: Secondary | ICD-10-CM | POA: Diagnosis not present

## 2024-03-03 DIAGNOSIS — J449 Chronic obstructive pulmonary disease, unspecified: Secondary | ICD-10-CM | POA: Diagnosis not present

## 2024-03-03 DIAGNOSIS — I10 Essential (primary) hypertension: Secondary | ICD-10-CM | POA: Diagnosis not present

## 2024-03-03 DIAGNOSIS — E46 Unspecified protein-calorie malnutrition: Secondary | ICD-10-CM | POA: Diagnosis not present

## 2024-03-03 DIAGNOSIS — G629 Polyneuropathy, unspecified: Secondary | ICD-10-CM | POA: Diagnosis not present

## 2024-03-03 DIAGNOSIS — I2510 Atherosclerotic heart disease of native coronary artery without angina pectoris: Secondary | ICD-10-CM | POA: Diagnosis not present

## 2024-03-03 DIAGNOSIS — I2584 Coronary atherosclerosis due to calcified coronary lesion: Secondary | ICD-10-CM | POA: Diagnosis not present

## 2024-03-03 DIAGNOSIS — J9611 Chronic respiratory failure with hypoxia: Secondary | ICD-10-CM | POA: Diagnosis not present

## 2024-03-04 DIAGNOSIS — J9612 Chronic respiratory failure with hypercapnia: Secondary | ICD-10-CM | POA: Diagnosis not present

## 2024-03-04 DIAGNOSIS — I10 Essential (primary) hypertension: Secondary | ICD-10-CM | POA: Diagnosis not present

## 2024-03-04 DIAGNOSIS — J9611 Chronic respiratory failure with hypoxia: Secondary | ICD-10-CM | POA: Diagnosis not present

## 2024-03-04 DIAGNOSIS — J449 Chronic obstructive pulmonary disease, unspecified: Secondary | ICD-10-CM | POA: Diagnosis not present

## 2024-03-04 DIAGNOSIS — M810 Age-related osteoporosis without current pathological fracture: Secondary | ICD-10-CM | POA: Diagnosis not present

## 2024-03-04 DIAGNOSIS — E46 Unspecified protein-calorie malnutrition: Secondary | ICD-10-CM | POA: Diagnosis not present

## 2024-03-04 DIAGNOSIS — I2584 Coronary atherosclerosis due to calcified coronary lesion: Secondary | ICD-10-CM | POA: Diagnosis not present

## 2024-03-04 DIAGNOSIS — I2510 Atherosclerotic heart disease of native coronary artery without angina pectoris: Secondary | ICD-10-CM | POA: Diagnosis not present

## 2024-03-04 DIAGNOSIS — G629 Polyneuropathy, unspecified: Secondary | ICD-10-CM | POA: Diagnosis not present

## 2024-03-05 DIAGNOSIS — J9611 Chronic respiratory failure with hypoxia: Secondary | ICD-10-CM | POA: Diagnosis not present

## 2024-03-05 DIAGNOSIS — I2584 Coronary atherosclerosis due to calcified coronary lesion: Secondary | ICD-10-CM | POA: Diagnosis not present

## 2024-03-05 DIAGNOSIS — I2510 Atherosclerotic heart disease of native coronary artery without angina pectoris: Secondary | ICD-10-CM | POA: Diagnosis not present

## 2024-03-05 DIAGNOSIS — E46 Unspecified protein-calorie malnutrition: Secondary | ICD-10-CM | POA: Diagnosis not present

## 2024-03-05 DIAGNOSIS — M810 Age-related osteoporosis without current pathological fracture: Secondary | ICD-10-CM | POA: Diagnosis not present

## 2024-03-05 DIAGNOSIS — J449 Chronic obstructive pulmonary disease, unspecified: Secondary | ICD-10-CM | POA: Diagnosis not present

## 2024-03-05 DIAGNOSIS — I10 Essential (primary) hypertension: Secondary | ICD-10-CM | POA: Diagnosis not present

## 2024-03-05 DIAGNOSIS — J9612 Chronic respiratory failure with hypercapnia: Secondary | ICD-10-CM | POA: Diagnosis not present

## 2024-03-05 DIAGNOSIS — G629 Polyneuropathy, unspecified: Secondary | ICD-10-CM | POA: Diagnosis not present

## 2024-03-10 ENCOUNTER — Ambulatory Visit: Payer: Medicare HMO | Attending: Family Medicine

## 2024-03-10 VITALS — Ht 61.0 in | Wt 104.0 lb

## 2024-03-10 DIAGNOSIS — I2510 Atherosclerotic heart disease of native coronary artery without angina pectoris: Secondary | ICD-10-CM | POA: Diagnosis not present

## 2024-03-10 DIAGNOSIS — G629 Polyneuropathy, unspecified: Secondary | ICD-10-CM | POA: Diagnosis not present

## 2024-03-10 DIAGNOSIS — M810 Age-related osteoporosis without current pathological fracture: Secondary | ICD-10-CM | POA: Diagnosis not present

## 2024-03-10 DIAGNOSIS — J9612 Chronic respiratory failure with hypercapnia: Secondary | ICD-10-CM | POA: Diagnosis not present

## 2024-03-10 DIAGNOSIS — J449 Chronic obstructive pulmonary disease, unspecified: Secondary | ICD-10-CM | POA: Diagnosis not present

## 2024-03-10 DIAGNOSIS — Z0000 Encounter for general adult medical examination without abnormal findings: Secondary | ICD-10-CM

## 2024-03-10 DIAGNOSIS — I10 Essential (primary) hypertension: Secondary | ICD-10-CM | POA: Diagnosis not present

## 2024-03-10 DIAGNOSIS — E46 Unspecified protein-calorie malnutrition: Secondary | ICD-10-CM | POA: Diagnosis not present

## 2024-03-10 DIAGNOSIS — I2584 Coronary atherosclerosis due to calcified coronary lesion: Secondary | ICD-10-CM | POA: Diagnosis not present

## 2024-03-10 DIAGNOSIS — J9611 Chronic respiratory failure with hypoxia: Secondary | ICD-10-CM | POA: Diagnosis not present

## 2024-03-10 NOTE — Progress Notes (Signed)
 Because this visit was a virtual/telehealth visit,  certain criteria was not obtained, such a blood pressure, CBG if applicable, and timed get up and go. Any medications not marked as "taking" were not mentioned during the medication reconciliation part of the visit. Any vitals not documented were not able to be obtained due to this being a telehealth visit or patient was unable to self-report a recent blood pressure reading due to a lack of equipment at home via telehealth. Vitals that have been documented are verbally provided by the patient.   Subjective:   Margaret Henry is a 73 y.o. who presents for a Medicare Wellness preventive visit.  As a reminder, Annual Wellness Visits don't include a physical exam, and some assessments may be limited, especially if this visit is performed virtually. We may recommend an in-person follow-up visit with your provider if needed.  Visit Complete: Virtual I connected with  Margaret Henry on 03/10/24 by a audio enabled telemedicine application and verified that I am speaking with the correct person using two identifiers.  Patient Location: Home  Provider Location: Office/Clinic  I discussed the limitations of evaluation and management by telemedicine. The patient expressed understanding and agreed to proceed.  Vital Signs: Because this visit was a virtual/telehealth visit, some criteria may be missing or patient reported. Any vitals not documented were not able to be obtained and vitals that have been documented are patient reported.  VideoDeclined- This patient declined Librarian, academic. Therefore the visit was completed with audio only.  Persons Participating in Visit: Patient.  AWV Questionnaire: Yes: Patient Medicare AWV questionnaire was completed by the patient on 03/06/2024; I have confirmed that all information answered by patient is correct and no changes since this date.  Cardiac Risk Factors include: advanced age  (>57men, >62 women);sedentary lifestyle;dyslipidemia;family history of premature cardiovascular disease     Objective:     Today's Vitals   03/10/24 1454  Weight: 104 lb (47.2 kg)  Height: 5\' 1"  (1.549 m)  PainSc: 0-No pain   Body mass index is 19.65 kg/m.     03/10/2024    2:58 PM 05/29/2023    8:56 AM 05/21/2023   11:27 PM 05/21/2023    3:07 PM 03/05/2023    2:55 PM 02/22/2023   10:55 AM 09/15/2022    1:20 PM  Advanced Directives  Does Patient Have a Medical Advance Directive? No No  No No No;Yes No  Type of Advance Directive      Healthcare Power of Attorney   Does patient want to make changes to medical advance directive?     Yes (MAU/Ambulatory/Procedural Areas - Information given) No - Patient declined   Copy of Healthcare Power of Attorney in Chart?      No - copy requested   Would patient like information on creating a medical advance directive? No - Patient declined No - Patient declined No - Patient declined   No - Patient declined No - Patient declined    Current Medications (verified) Outpatient Encounter Medications as of 03/10/2024  Medication Sig   albuterol  (PROVENTIL ) (2.5 MG/3ML) 0.083% nebulizer solution Take 2.5 mg by nebulization every 6 (six) hours as needed for wheezing or shortness of breath.   atorvastatin  (LIPITOR) 40 MG tablet TAKE 1 TABLET EVERY DAY (Patient taking differently: Take 40 mg by mouth daily.)   azithromycin  (ZITHROMAX ) 250 MG tablet Take 1 tablet (250 mg total) by mouth daily.   bismuth subsalicylate (PEPTO BISMOL) 262 MG/15ML suspension  Take 30 mLs by mouth every 6 (six) hours as needed for indigestion.   BREO ELLIPTA  200-25 MCG/ACT AEPB INHALE 1 PUFF EVERY DAY (Patient taking differently: Inhale 1 puff into the lungs daily.)   ferrous sulfate  325 (65 FE) MG EC tablet Take 325 mg by mouth every other day. Pt states she is only taking once a week due to constipation   fluticasone  (FLONASE ) 50 MCG/ACT nasal spray Place 2 sprays into both nostrils  daily.   loratadine  (CLARITIN ) 10 MG tablet Take 1 tablet (10 mg total) by mouth daily. *Please keep upcoming appt for additional refills.*   nitroGLYCERIN  (NITROSTAT ) 0.4 MG SL tablet Place 1 tablet (0.4 mg total) under the tongue every 5 (five) minutes as needed for chest pain.   OXYGEN  Inhale 3 L/min into the lungs continuous.   SPIRIVA  RESPIMAT 2.5 MCG/ACT AERS INHALE 2 PUFFS EVERY DAY (Patient taking differently: Inhale 2 puffs into the lungs daily.)   vitamin B-12 (CYANOCOBALAMIN ) 100 MCG tablet Take 100 mcg by mouth daily.   [DISCONTINUED] predniSONE  (DELTASONE ) 10 MG tablet Take 1 tablet (10 mg total) by mouth daily with breakfast.   No facility-administered encounter medications on file as of 03/10/2024.    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)   Anal intraepithelial neoplasia II (AIN II) 2017   Anemia 04/06/22   Aortic atherosclerosis (HCC)    B12 deficiency    Blood transfusion without reported diagnosis 6/23   Bronchitis    Cancer (HCC)    skin cancer on chest   Cataract 5/23   Chest pain 06/12/2016   Cholelithiasis    Colon polyps    Complication of anesthesia 05/16/2021   pt states was given too much during nasal surgery 1989; difficulty getting awake   COPD (chronic obstructive pulmonary disease) (HCC)    Coronary artery calcification    Diverticulitis 05/16/2021   occasional   Diverticulosis    Dyspnea 06/27/2016   Emphysema of lung (HCC)    External hemorrhoids    GERD (gastroesophageal reflux disease)    occasional   Hemorrhoids    High cholesterol    Hyperlipidemia LDL goal <70    Iron deficiency    Mild aortic insufficiency    Neuropathy 04/07/2022   Osteoporosis    Oxygen  deficiency    Pneumonia    Shortness of breath dyspnea     with exertion    Thyroid  goiter    bx benign   Tobacco abuse    Vertigo 04/21/2017   Past Surgical History:  Procedure Laterality Date   ABDOMINAL HYSTERECTOMY     APPENDECTOMY  1973   BILATERAL OOPHORECTOMY  10/09/1999   for benign ovarian mass    BIOPSY THYROID      DENTAL SURGERY     dentures   NASAL SEPTUM SURGERY     PARTIAL HYSTERECTOMY  10/08/1974   for heavy menses    RECTAL EXAM UNDER ANESTHESIA N/A 12/14/2015   Procedure: RECTAL EXAM UNDER ANESTHESIA REMOVAL OF ANAL CANAL MASS; INTERNAL HEMORRHOID LIGATION, EXTERNAL HEMORRHOID LIGATION;  Surgeon: Candyce Champagne, MD;  Location: [facility];  Service: General;  Laterality: N/A;   TUBAL LIGATION     WISDOM TOOTH EXTRACTION     Family History  Problem Relation Age of Onset   Heart disease Mother  Alcohol abuse Mother    Asthma Mother    COPD Father    Heart disease Father    Lung cancer Maternal Grandfather    Cancer Maternal Grandfather    Liver cancer Paternal Grandmother    Cancer Paternal Grandmother    Diabetes Paternal Aunt    Colon cancer Neg Hx    Colon polyps Neg Hx    Esophageal cancer Neg Hx    Rectal cancer Neg Hx    Stomach cancer Neg Hx    Social History   Socioeconomic History   Marital status: Widowed    Spouse name: Not on file   Number of children: Not on file   Years of education: Not on file   Highest education level: 9th grade  Occupational History   Not on file  Tobacco Use   Smoking status: Former    Current packs/day: 0.00    Average packs/day: 1.5 packs/day for 50.0 years (75.0 ttl pk-yrs)    Types: Cigarettes    Start date: 11/03/1968    Quit date: 11/03/2018    Years since quitting: 5.3   Smokeless tobacco: Never  Vaping Use   Vaping status: Never Used  Substance and Sexual Activity   Alcohol use: No   Drug use: No   Sexual activity: Never  Other Topics Concern   Not on file  Social History Narrative   Not on file   Social Drivers of Health   Financial Resource Strain:  Low Risk  (03/10/2024)   Overall Financial Resource Strain (CARDIA)    Difficulty of Paying Living Expenses: Not hard at all  Food Insecurity: No Food Insecurity (02/02/2024)   Hunger Vital Sign    Worried About Running Out of Food in the Last Year: Never true    Ran Out of Food in the Last Year: Never true  Transportation Needs: No Transportation Needs (03/10/2024)   PRAPARE - Administrator, Civil Service (Medical): No    Lack of Transportation (Non-Medical): No  Physical Activity: Insufficiently Active (03/10/2024)   Exercise Vital Sign    Days of Exercise per Week: 3 days    Minutes of Exercise per Session: 10 min  Stress: No Stress Concern Present (03/10/2024)   [employer] of Occupational Health - Occupational Stress Questionnaire    Feeling of Stress : Not at all  Social Connections: Socially Isolated (03/10/2024)   Social Connection and Isolation Panel [NHANES]    Frequency of Communication with Friends and Family: More than three times a week    Frequency of Social Gatherings with Friends and Family: Twice a week    Attends Religious Services: Never    Database administrator or Organizations: No    Attends Engineer, structural: Not on file    Marital Status: Widowed    Tobacco Counseling Counseling given: Not Answered    Clinical Intake:  Pre-visit preparation completed: Yes  Pain : No/denies pain Pain Score: 0-No pain     BMI - recorded: 19.65 Nutritional Status: BMI of 19-24  Normal Nutritional Risks: None Diabetes: No  Lab Results  Component Value Date   HGBA1C 5.2 02/06/2024   HGBA1C 5.5 07/04/2022   HGBA1C 5.9 (H) 02/15/2021     How often do you need to have someone help you when you read instructions, pamphlets, or other written materials from your doctor or pharmacy?: 1 - Never  Interpreter Needed?: No  Information entered by :: Craige Patel N. Otho Michalik, LPN.   Activities  of Daily Living     03/10/2024    2:58 PM 03/06/2024     9:12 AM  In your present state of health, do you have any difficulty performing the following activities:  Hearing? 0 0  Vision? 0 0  Difficulty concentrating or making decisions? 0 0  Walking or climbing stairs? 1 1  Dressing or bathing? 1 1  Doing errands, shopping? 1 1  Preparing Food and eating ? Y Y  Using the Toilet? N N  In the past six months, have you accidently leaked urine? Y Y  Do you have problems with loss of bowel control? N N  Managing your Medications? N N  Managing your Finances? N N  Housekeeping or managing your Housekeeping? Colie Dawes    Patient Care Team: Joaquin Mulberry, MD as PCP - General (Family Medicine) Liza Riggers, MD as PCP - Cardiology (Cardiology) Candyce Champagne, MD as Consulting Physician (General Surgery) Pyrtle, Amber Bail, MD as Consulting Physician (Gastroenterology) Mannam, Praveen, MD as Consulting Physician (Pulmonary Disease) Renate Caroline, MD as Referring Physician (Endocrinology) Wes Hamman, MD as Attending Physician (Orthopedic Surgery)  I have updated your Care Teams any recent Medical Services you may have received from other providers in the past year.     Assessment:    This is a routine wellness examination for Margaret Henry.  Hearing/Vision screen Hearing Screening - Comments:: Denies hearing difficulties.  Vision Screening - Comments:: Wears reading glasses - patient stated that she is up to date with routine eye exams.    Goals Addressed             This Visit's Progress    03/10/2024: To be able to go outside and not get winded or short of breath.         Depression Screen     03/10/2024    2:59 PM 02/06/2024   11:14 AM 03/05/2023    2:53 PM 01/03/2023   11:11 AM 07/04/2022    3:38 PM 04/17/2022    3:06 PM 10/22/2018    3:52 PM  PHQ 2/9 Scores  PHQ - 2 Score 0 0 0 0 0 0 0  PHQ- 9 Score 2 2  1   0 3    Fall Risk     03/10/2024    2:58 PM 03/06/2024    9:12 AM 02/06/2024   11:14 AM 09/18/2023    3:23 PM 03/05/2023    2:54  PM  Fall Risk   Falls in the past year? 0 0 0 0 0  Number falls in past yr: 0 0 0  0  Injury with Fall? 0 0 0  0  Risk for fall due to : No Fall Risks  No Fall Risks  Impaired mobility;Impaired balance/gait  Follow up Falls evaluation completed  Falls evaluation completed  Falls prevention discussed;Education provided;Falls evaluation completed    MEDICARE RISK AT HOME:  Medicare Risk at Home Any stairs in or around the home?: No If so, are there any without handrails?: No Home free of loose throw rugs in walkways, pet beds, electrical cords, etc?: Yes Adequate lighting in your home to reduce risk of falls?: Yes Life alert?: No Use of a cane, walker or w/c?: Yes Grab bars in the bathroom?: Yes Shower chair or bench in shower?: Yes Elevated toilet seat or a handicapped toilet?: Yes  TIMED UP AND GO:  Was the test performed?  No  Cognitive Function: Declined/Normal: No cognitive concerns noted by patient or  family. Patient alert, oriented, able to answer questions appropriately and recall recent events. No signs of memory loss or confusion.    03/10/2024    2:59 PM  MMSE - Mini Mental State Exam  Not completed: Unable to complete        03/10/2024    2:56 PM 03/05/2023    2:55 PM  6CIT Screen  What Year? 0 points 0 points  What month? 0 points 0 points  What time? 0 points 0 points  Count back from 20 0 points 0 points  Months in reverse 0 points 0 points  Repeat phrase 0 points 0 points  Total Score 0 points 0 points    Immunizations Immunization History  Administered Date(s) Administered   Fluad Quad(high Dose 65+) 07/20/2019, 07/12/2021, 07/04/2022   Fluad Trivalent(High Dose 65+) 06/20/2023   Influenza Split 07/07/2013, 07/05/2016   Influenza Whole 11/09/2009   Influenza,inj,Quad PF,6+ Mos 06/24/2015, 07/03/2017, 06/19/2018   PNEUMOCOCCAL CONJUGATE-20 06/20/2023   Pneumococcal Conjugate-13 09/26/2015   Pneumococcal Polysaccharide-23 10/08/1998, 07/08/2018    Respiratory Syncytial Virus Vaccine ,Recomb Aduvanted(Arexvy ) 07/05/2022   Td 10/08/2002   Td (Adult), 2 Lf Tetanus Toxid, Preservative Free 10/08/2002   Tdap 09/10/2016    Screening Tests Health Maintenance  Topic Date Due   COVID-19 Vaccine (1) Never done   Zoster Vaccines- Shingrix (1 of 2) Never done   INFLUENZA VACCINE  05/08/2024   Lung Cancer Screening  05/20/2024   Medicare Annual Wellness (AWV)  03/10/2025   MAMMOGRAM  03/17/2025   Fecal DNA (Cologuard)  07/12/2025   DTaP/Tdap/Td (4 - Td or Tdap) 09/10/2026   Pneumonia Vaccine 17+ Years old  Completed   DEXA SCAN  Completed   Hepatitis C Screening  Completed   HPV VACCINES  Aged Out   Meningococcal B Vaccine  Aged Out   Colonoscopy  Discontinued    Health Maintenance  Health Maintenance Due  Topic Date Due   COVID-19 Vaccine (1) Never done   Zoster Vaccines- Shingrix (1 of 2) Never done   Health Maintenance Items Addressed: Yes Patient aware of current care gaps.  Immunization record was verified by [employer].  Patient declined vaccines.  Additional Screening:  Vision Screening: Recommended annual ophthalmology exams for early detection of glaucoma and other disorders of the eye. Would you like a referral to an eye doctor? No    Dental Screening: Recommended annual dental exams for proper oral hygiene  Community Resource Referral / Chronic Care Management: CRR required this visit?  No   CCM required this visit?  No   Plan:    I have personally reviewed and noted the following in the patient's chart:   Medical and social history Use of alcohol, tobacco or illicit drugs  Current medications and supplements including opioid prescriptions. Patient is not currently taking opioid prescriptions. Functional ability and status Nutritional status Physical activity Advanced directives List of other physicians Hospitalizations, surgeries, and ER visits in previous 12 months Vitals Screenings to include cognitive,  depression, and falls Referrals and appointments  In addition, I have reviewed and discussed with patient certain preventive protocols, quality metrics, and best practice recommendations. A written personalized care plan for preventive services as well as general preventive health recommendations were provided to patient.   Margette Sheldon, LPN   10/13/1094   After Visit Summary: (MyChart) Due to this being a telephonic visit, the after visit summary with patients personalized plan was offered to patient via MyChart   Notes: Patient aware of current care  gaps.  Immunization record was verified by [employer].  Patient declined vaccines.

## 2024-03-10 NOTE — Patient Instructions (Signed)
 Ms. Marrocco , Thank you for taking time out of your busy schedule to complete your Annual Wellness Visit with me. I enjoyed our conversation and look forward to speaking with you again next year. I, as well as your care team,  appreciate your ongoing commitment to your health goals. Please review the following plan we discussed and let me know if I can assist you in the future. Your Game plan/ To Do List    Referrals: If you haven't heard from the office you've been referred to, please reach out to them at the phone provided.  None at this time. Follow up Visits: Next Medicare AWV with our clinical staff: 03/16/2025 at 2:50 pm Phone Visit with Nurse Health Advisor   Have you seen your provider in the last 6 months (3 months if uncontrolled diabetes)? Yes Next Office Visit with your provider: 08/10/2024 at 9:30 am Office Visit with Dr. Adan Holms   Clinician Recommendations:  Aim for 30 minutes of exercise or brisk walking, 6-8 glasses of water, and 5 servings of fruits and vegetables each day.       This is a list of the screening recommended for you and due dates:  Health Maintenance  Topic Date Due   COVID-19 Vaccine (1) Never done   Zoster (Shingles) Vaccine (1 of 2) Never done   Flu Shot  05/08/2024   Screening for Lung Cancer  05/20/2024   Medicare Annual Wellness Visit  03/10/2025   Mammogram  03/17/2025   Cologuard (Stool DNA test)  07/12/2025   DTaP/Tdap/Td vaccine (4 - Td or Tdap) 09/10/2026   Pneumonia Vaccine  Completed   DEXA scan (bone density measurement)  Completed   Hepatitis C Screening  Completed   HPV Vaccine  Aged Out   Meningitis B Vaccine  Aged Out   Colon Cancer Screening  Discontinued    Advanced directives: (Declined) Advance directive discussed with you today. Even though you declined this today, please call our office should you change your mind, and we can give you the proper paperwork for you to fill out. Advance Care Planning is important because it:  [x]   Makes sure you receive the medical care that is consistent with your values, goals, and preferences  [x]  It provides guidance to your family and loved ones and reduces their decisional burden about whether or not they are making the right decisions based on your wishes.  Follow the link provided in your after visit summary or read over the paperwork we have mailed to you to help you started getting your Advance Directives in place. If you need assistance in completing these, please reach out to us  so that we can help you!  See attachments for Preventive Care and Fall Prevention Tips.

## 2024-03-11 DIAGNOSIS — G629 Polyneuropathy, unspecified: Secondary | ICD-10-CM | POA: Diagnosis not present

## 2024-03-11 DIAGNOSIS — I10 Essential (primary) hypertension: Secondary | ICD-10-CM | POA: Diagnosis not present

## 2024-03-11 DIAGNOSIS — J9612 Chronic respiratory failure with hypercapnia: Secondary | ICD-10-CM | POA: Diagnosis not present

## 2024-03-11 DIAGNOSIS — I2584 Coronary atherosclerosis due to calcified coronary lesion: Secondary | ICD-10-CM | POA: Diagnosis not present

## 2024-03-11 DIAGNOSIS — E46 Unspecified protein-calorie malnutrition: Secondary | ICD-10-CM | POA: Diagnosis not present

## 2024-03-11 DIAGNOSIS — M810 Age-related osteoporosis without current pathological fracture: Secondary | ICD-10-CM | POA: Diagnosis not present

## 2024-03-11 DIAGNOSIS — I2510 Atherosclerotic heart disease of native coronary artery without angina pectoris: Secondary | ICD-10-CM | POA: Diagnosis not present

## 2024-03-11 DIAGNOSIS — J9611 Chronic respiratory failure with hypoxia: Secondary | ICD-10-CM | POA: Diagnosis not present

## 2024-03-11 DIAGNOSIS — J449 Chronic obstructive pulmonary disease, unspecified: Secondary | ICD-10-CM | POA: Diagnosis not present

## 2024-03-12 ENCOUNTER — Encounter: Payer: Self-pay | Admitting: Pharmacist

## 2024-03-12 ENCOUNTER — Other Ambulatory Visit (HOSPITAL_BASED_OUTPATIENT_CLINIC_OR_DEPARTMENT_OTHER): Admitting: Pharmacist

## 2024-03-12 DIAGNOSIS — E785 Hyperlipidemia, unspecified: Secondary | ICD-10-CM

## 2024-03-12 MED ORDER — ATORVASTATIN CALCIUM 40 MG PO TABS: 40.0000 mg | ORAL_TABLET | Freq: Every day | ORAL | 3 refills | Status: AC

## 2024-03-12 NOTE — Progress Notes (Signed)
 Pharmacy Quality Measure Review  This patient is appearing on a report for being at risk of failing the adherence measure for cholesterol (statin) medications this calendar year.   Medication: atorvastatin  Last fill date: 10/25/2023 for 90 day supply  Contacted pharmacy to facilitate refills. Rxn sent for 1 year supply. Reminder set for next fill date.   Marene Shape, PharmD, Becky Bowels, CPP Clinical Pharmacist [facility] [phone]

## 2024-03-13 DIAGNOSIS — J9612 Chronic respiratory failure with hypercapnia: Secondary | ICD-10-CM | POA: Diagnosis not present

## 2024-03-13 DIAGNOSIS — I2510 Atherosclerotic heart disease of native coronary artery without angina pectoris: Secondary | ICD-10-CM | POA: Diagnosis not present

## 2024-03-13 DIAGNOSIS — G629 Polyneuropathy, unspecified: Secondary | ICD-10-CM | POA: Diagnosis not present

## 2024-03-13 DIAGNOSIS — I2584 Coronary atherosclerosis due to calcified coronary lesion: Secondary | ICD-10-CM | POA: Diagnosis not present

## 2024-03-13 DIAGNOSIS — M810 Age-related osteoporosis without current pathological fracture: Secondary | ICD-10-CM | POA: Diagnosis not present

## 2024-03-13 DIAGNOSIS — J449 Chronic obstructive pulmonary disease, unspecified: Secondary | ICD-10-CM | POA: Diagnosis not present

## 2024-03-13 DIAGNOSIS — E46 Unspecified protein-calorie malnutrition: Secondary | ICD-10-CM | POA: Diagnosis not present

## 2024-03-13 DIAGNOSIS — J9611 Chronic respiratory failure with hypoxia: Secondary | ICD-10-CM | POA: Diagnosis not present

## 2024-03-13 DIAGNOSIS — I10 Essential (primary) hypertension: Secondary | ICD-10-CM | POA: Diagnosis not present

## 2024-03-17 DIAGNOSIS — I2584 Coronary atherosclerosis due to calcified coronary lesion: Secondary | ICD-10-CM | POA: Diagnosis not present

## 2024-03-17 DIAGNOSIS — M810 Age-related osteoporosis without current pathological fracture: Secondary | ICD-10-CM | POA: Diagnosis not present

## 2024-03-17 DIAGNOSIS — E46 Unspecified protein-calorie malnutrition: Secondary | ICD-10-CM | POA: Diagnosis not present

## 2024-03-17 DIAGNOSIS — J9611 Chronic respiratory failure with hypoxia: Secondary | ICD-10-CM | POA: Diagnosis not present

## 2024-03-17 DIAGNOSIS — I2510 Atherosclerotic heart disease of native coronary artery without angina pectoris: Secondary | ICD-10-CM | POA: Diagnosis not present

## 2024-03-17 DIAGNOSIS — J449 Chronic obstructive pulmonary disease, unspecified: Secondary | ICD-10-CM | POA: Diagnosis not present

## 2024-03-17 DIAGNOSIS — I10 Essential (primary) hypertension: Secondary | ICD-10-CM | POA: Diagnosis not present

## 2024-03-17 DIAGNOSIS — J9612 Chronic respiratory failure with hypercapnia: Secondary | ICD-10-CM | POA: Diagnosis not present

## 2024-03-17 DIAGNOSIS — G629 Polyneuropathy, unspecified: Secondary | ICD-10-CM | POA: Diagnosis not present

## 2024-03-18 ENCOUNTER — Other Ambulatory Visit (HOSPITAL_BASED_OUTPATIENT_CLINIC_OR_DEPARTMENT_OTHER): Admitting: Pharmacist

## 2024-03-18 ENCOUNTER — Encounter: Payer: Self-pay | Admitting: Pharmacist

## 2024-03-18 DIAGNOSIS — M810 Age-related osteoporosis without current pathological fracture: Secondary | ICD-10-CM | POA: Diagnosis not present

## 2024-03-18 DIAGNOSIS — E7800 Pure hypercholesterolemia, unspecified: Secondary | ICD-10-CM

## 2024-03-18 DIAGNOSIS — G629 Polyneuropathy, unspecified: Secondary | ICD-10-CM | POA: Diagnosis not present

## 2024-03-18 DIAGNOSIS — E46 Unspecified protein-calorie malnutrition: Secondary | ICD-10-CM | POA: Diagnosis not present

## 2024-03-18 DIAGNOSIS — J449 Chronic obstructive pulmonary disease, unspecified: Secondary | ICD-10-CM | POA: Diagnosis not present

## 2024-03-18 DIAGNOSIS — I10 Essential (primary) hypertension: Secondary | ICD-10-CM | POA: Diagnosis not present

## 2024-03-18 DIAGNOSIS — J9611 Chronic respiratory failure with hypoxia: Secondary | ICD-10-CM | POA: Diagnosis not present

## 2024-03-18 DIAGNOSIS — J9612 Chronic respiratory failure with hypercapnia: Secondary | ICD-10-CM | POA: Diagnosis not present

## 2024-03-18 DIAGNOSIS — I2510 Atherosclerotic heart disease of native coronary artery without angina pectoris: Secondary | ICD-10-CM | POA: Diagnosis not present

## 2024-03-18 DIAGNOSIS — I2584 Coronary atherosclerosis due to calcified coronary lesion: Secondary | ICD-10-CM | POA: Diagnosis not present

## 2024-03-18 NOTE — Progress Notes (Signed)
 Pharmacy Quality Measure Review  This patient is appearing on a report for being at risk of failing the adherence measure for cholesterol (statin) medications this calendar year.   Medication: atorvastatin  Last fill date: 03/13/2024 for 90 day supply  Refills now up-to-date. Reminder set for next fill date.   Marene Shape, PharmD, Becky Bowels, CPP Clinical Pharmacist [facility] [phone]

## 2024-03-24 DIAGNOSIS — I10 Essential (primary) hypertension: Secondary | ICD-10-CM | POA: Diagnosis not present

## 2024-03-24 DIAGNOSIS — J449 Chronic obstructive pulmonary disease, unspecified: Secondary | ICD-10-CM | POA: Diagnosis not present

## 2024-03-24 DIAGNOSIS — M810 Age-related osteoporosis without current pathological fracture: Secondary | ICD-10-CM | POA: Diagnosis not present

## 2024-03-24 DIAGNOSIS — G629 Polyneuropathy, unspecified: Secondary | ICD-10-CM | POA: Diagnosis not present

## 2024-03-24 DIAGNOSIS — I2584 Coronary atherosclerosis due to calcified coronary lesion: Secondary | ICD-10-CM | POA: Diagnosis not present

## 2024-03-24 DIAGNOSIS — E46 Unspecified protein-calorie malnutrition: Secondary | ICD-10-CM | POA: Diagnosis not present

## 2024-03-24 DIAGNOSIS — J9612 Chronic respiratory failure with hypercapnia: Secondary | ICD-10-CM | POA: Diagnosis not present

## 2024-03-24 DIAGNOSIS — J9611 Chronic respiratory failure with hypoxia: Secondary | ICD-10-CM | POA: Diagnosis not present

## 2024-03-24 DIAGNOSIS — I2510 Atherosclerotic heart disease of native coronary artery without angina pectoris: Secondary | ICD-10-CM | POA: Diagnosis not present

## 2024-03-25 ENCOUNTER — Telehealth (INDEPENDENT_AMBULATORY_CARE_PROVIDER_SITE_OTHER): Payer: Self-pay

## 2024-03-25 NOTE — Telephone Encounter (Signed)
 Copied from CRM [id number]. Topic: General - Other >> Mar 24, 2024  3:20 PM Emylou G wrote: Reason for CRM: Gracee w/[facility]  Called requesting verbal approval for home health aid 1w3 call back [phone] secured vmail

## 2024-03-26 NOTE — Telephone Encounter (Signed)
 LVM with verbal orders.

## 2024-03-31 DIAGNOSIS — I10 Essential (primary) hypertension: Secondary | ICD-10-CM | POA: Diagnosis not present

## 2024-03-31 DIAGNOSIS — J9612 Chronic respiratory failure with hypercapnia: Secondary | ICD-10-CM | POA: Diagnosis not present

## 2024-03-31 DIAGNOSIS — J449 Chronic obstructive pulmonary disease, unspecified: Secondary | ICD-10-CM | POA: Diagnosis not present

## 2024-03-31 DIAGNOSIS — I2584 Coronary atherosclerosis due to calcified coronary lesion: Secondary | ICD-10-CM | POA: Diagnosis not present

## 2024-03-31 DIAGNOSIS — M810 Age-related osteoporosis without current pathological fracture: Secondary | ICD-10-CM | POA: Diagnosis not present

## 2024-03-31 DIAGNOSIS — G629 Polyneuropathy, unspecified: Secondary | ICD-10-CM | POA: Diagnosis not present

## 2024-03-31 DIAGNOSIS — I2510 Atherosclerotic heart disease of native coronary artery without angina pectoris: Secondary | ICD-10-CM | POA: Diagnosis not present

## 2024-03-31 DIAGNOSIS — J9611 Chronic respiratory failure with hypoxia: Secondary | ICD-10-CM | POA: Diagnosis not present

## 2024-03-31 DIAGNOSIS — E46 Unspecified protein-calorie malnutrition: Secondary | ICD-10-CM | POA: Diagnosis not present

## 2024-04-01 DIAGNOSIS — I2510 Atherosclerotic heart disease of native coronary artery without angina pectoris: Secondary | ICD-10-CM | POA: Diagnosis not present

## 2024-04-01 DIAGNOSIS — J449 Chronic obstructive pulmonary disease, unspecified: Secondary | ICD-10-CM | POA: Diagnosis not present

## 2024-04-01 DIAGNOSIS — E46 Unspecified protein-calorie malnutrition: Secondary | ICD-10-CM | POA: Diagnosis not present

## 2024-04-01 DIAGNOSIS — J9611 Chronic respiratory failure with hypoxia: Secondary | ICD-10-CM | POA: Diagnosis not present

## 2024-04-01 DIAGNOSIS — M810 Age-related osteoporosis without current pathological fracture: Secondary | ICD-10-CM | POA: Diagnosis not present

## 2024-04-01 DIAGNOSIS — I10 Essential (primary) hypertension: Secondary | ICD-10-CM | POA: Diagnosis not present

## 2024-04-01 DIAGNOSIS — I2584 Coronary atherosclerosis due to calcified coronary lesion: Secondary | ICD-10-CM | POA: Diagnosis not present

## 2024-04-01 DIAGNOSIS — J9612 Chronic respiratory failure with hypercapnia: Secondary | ICD-10-CM | POA: Diagnosis not present

## 2024-04-01 DIAGNOSIS — G629 Polyneuropathy, unspecified: Secondary | ICD-10-CM | POA: Diagnosis not present

## 2024-04-06 DIAGNOSIS — J449 Chronic obstructive pulmonary disease, unspecified: Secondary | ICD-10-CM | POA: Diagnosis not present

## 2024-04-06 DIAGNOSIS — I2584 Coronary atherosclerosis due to calcified coronary lesion: Secondary | ICD-10-CM | POA: Diagnosis not present

## 2024-04-06 DIAGNOSIS — I2510 Atherosclerotic heart disease of native coronary artery without angina pectoris: Secondary | ICD-10-CM | POA: Diagnosis not present

## 2024-04-06 DIAGNOSIS — J9612 Chronic respiratory failure with hypercapnia: Secondary | ICD-10-CM | POA: Diagnosis not present

## 2024-04-06 DIAGNOSIS — E46 Unspecified protein-calorie malnutrition: Secondary | ICD-10-CM | POA: Diagnosis not present

## 2024-04-06 DIAGNOSIS — G629 Polyneuropathy, unspecified: Secondary | ICD-10-CM | POA: Diagnosis not present

## 2024-04-06 DIAGNOSIS — I10 Essential (primary) hypertension: Secondary | ICD-10-CM | POA: Diagnosis not present

## 2024-04-06 DIAGNOSIS — M810 Age-related osteoporosis without current pathological fracture: Secondary | ICD-10-CM | POA: Diagnosis not present

## 2024-04-06 DIAGNOSIS — J9611 Chronic respiratory failure with hypoxia: Secondary | ICD-10-CM | POA: Diagnosis not present

## 2024-04-08 ENCOUNTER — Ambulatory Visit: Payer: Self-pay | Admitting: Physician Assistant

## 2024-04-08 ENCOUNTER — Ambulatory Visit: Payer: Self-pay | Admitting: Nurse Practitioner

## 2024-04-08 ENCOUNTER — Encounter: Payer: Self-pay | Admitting: Nurse Practitioner

## 2024-04-08 ENCOUNTER — Other Ambulatory Visit (HOSPITAL_BASED_OUTPATIENT_CLINIC_OR_DEPARTMENT_OTHER): Payer: Self-pay

## 2024-04-08 ENCOUNTER — Ambulatory Visit (INDEPENDENT_AMBULATORY_CARE_PROVIDER_SITE_OTHER): Admitting: Nurse Practitioner

## 2024-04-08 ENCOUNTER — Ambulatory Visit (INDEPENDENT_AMBULATORY_CARE_PROVIDER_SITE_OTHER)

## 2024-04-08 ENCOUNTER — Ambulatory Visit
Admission: RE | Admit: 2024-04-08 | Discharge: 2024-04-08 | Disposition: A | Discharge status: Home / Self Care | Source: Ambulatory Visit | Attending: Family Medicine | Admitting: Family Medicine

## 2024-04-08 VITALS — BP 130/60 | HR 105 | Ht 64.0 in | Wt 103.4 lb

## 2024-04-08 DIAGNOSIS — R0602 Shortness of breath: Secondary | ICD-10-CM | POA: Diagnosis not present

## 2024-04-08 DIAGNOSIS — Z87891 Personal history of nicotine dependence: Secondary | ICD-10-CM

## 2024-04-08 DIAGNOSIS — J439 Emphysema, unspecified: Secondary | ICD-10-CM | POA: Diagnosis not present

## 2024-04-08 DIAGNOSIS — J441 Chronic obstructive pulmonary disease with (acute) exacerbation: Secondary | ICD-10-CM

## 2024-04-08 DIAGNOSIS — J9611 Chronic respiratory failure with hypoxia: Secondary | ICD-10-CM

## 2024-04-08 DIAGNOSIS — R921 Mammographic calcification found on diagnostic imaging of breast: Secondary | ICD-10-CM | POA: Diagnosis not present

## 2024-04-08 DIAGNOSIS — J4489 Other specified chronic obstructive pulmonary disease: Secondary | ICD-10-CM

## 2024-04-08 DIAGNOSIS — I700 Atherosclerosis of aorta: Secondary | ICD-10-CM | POA: Diagnosis not present

## 2024-04-08 MED ORDER — OHTUVAYRE 3 MG/2.5ML IN SUSP: 2.5000 mL | Freq: Two times a day (BID) | RESPIRATORY_TRACT | Status: DC

## 2024-04-08 MED ORDER — PREDNISONE 20 MG PO TABS
20.0000 mg | ORAL_TABLET | Freq: Every day | ORAL | 0 refills | Status: AC
  Filled 2024-04-08: qty 5, 5d supply, fill #0

## 2024-04-08 MED ORDER — METHYLPREDNISOLONE ACETATE 80 MG/ML IJ SUSP
80.0000 mg | Freq: Once | INTRAMUSCULAR | Status: AC
  Administered 2024-04-08: 80 mg via INTRAMUSCULAR

## 2024-04-08 NOTE — Progress Notes (Signed)
 No acute process. Lungs are over inflated, normal for COPD.

## 2024-04-08 NOTE — Progress Notes (Signed)
 @Patient  ID: Margaret Henry, female    DOB: 1951-03-31, 73 y.o.   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  She is a patient of Dr. Jiles and last seen 04/22/2023.  Past medical history significant for mild aortic insufficiency, allergic rhinitis, GERD, thyroid  goiter, OA, HLD, anemia.  TEST/EVENTS:  07/21/2015 PFTs: FVC 66, FEV1 39, ratio 46, DLCO 36 04/24/2022 LDCT chest: central airways are patent. Severe centrilobular emphysema. New irregular solid subpleural pulmonary nodule of the LLL measuring 7.2 mm. Lung RADS 4A. New linear consolidation of the RML. Other nodules are stable.  05/18/2022 CXR 2 view: hyperinflation, consistent with COPD. Mild atelectasis in the left lung base.  05/29/2022 sputum culture: pseudomonas 06/01/2022 CTA chest: no evidence of PE. Moderate CAD. No LAD. There are emphysematous changes b/l. No acute pulmonary disease. The left posterior basilar opacity has resolved. There is a probable healing subacute fracture involving the anterior portion of the left third rib. 05/21/2023 CTA chest: atherosclerosis. No PE. Small hiatal hernia. Unchanged thyroid  lesion. Severe diffused emphysematous changes. Bronchial wall thickening and btx, consistent with chronic bronchitic changes. Scattered fibrosis. Nodule in LLL, 4 mm. 5.5 mm in left lingula. No change.   04/22/2023: OV with Dr. Theophilus. Hospitalized in November 2023 and December 2023 for COPD exacerbation. Continues on inhalers and chronic azithromycin . On prednisone  taper. Recommended nebulizers but preferred to hold off and continue Breo/Spiriva . Unable to tolerate daliresp . Continue oxygen . Completed pulmonary rehab twice. Check CXR. Goals of care  discussed. Palliative care pending.   04/08/2024: Today - follow up Discussed the use of AI scribe software for clinical note transcription with the patient, who gave verbal consent to proceed.  History of Present Illness   Margaret Henry is a 73 year old female with COPD and chronic respiratory issues who presents with worsening shortness of breath.  Over the past two to three weeks, she has experienced increased difficulty with breathing, particularly in the mornings. Her oxygen  levels have been in 86-88% in the morning despite using three and 3/4 liters of oxygen  continuously. During the day, her oxygen  levels drop to 89-90 when she is active with 3 and 3/4 lpm, but they eventually return to 95-96 when she rests. No significant change in her cough, which remains greenish as it has been for the past three years, and she does not experience wheezing.   She experiences significant bloating, which worsens after eating, such as after having a cup of coffee or half a sandwich. She uses Pepto for relief but has not discussed this issue with her primary care doctor. She recalls a recent episode of illness with fever, chills, and diarrhea that resolved overnight, occurring a couple of weeks ago and preceded current symptoms. No nausea, vomiting, diarrhea or constipation now.   Her current medications include azithromycin , which she has been on for three years, Breo, Spiriva , and albuterol  as a rescue inhaler. She uses her nebulizer mainly as a rescue treatment, approximately twice a day. She cannot take Mucinex  due to intolerance.       Allergies  Allergen Reactions   Hydrocodone  Nausea And Vomiting   Propoxyphene N-Acetaminophen  Nausea And Vomiting   Augmentin  [Amoxicillin -Pot Clavulanate] Rash    Immunization History  Administered Date(s) Administered   Fluad  Quad(high Dose 65+) 07/20/2019, 07/12/2021, 07/04/2022   Fluad  Trivalent(High Dose 65+) 06/20/2023   Influenza Split 07/07/2013,  07/05/2016   Influenza Whole 11/09/2009   Influenza,inj,Quad PF,6+ Mos 06/24/2015, 07/03/2017, 06/19/2018   PNEUMOCOCCAL CONJUGATE-20 06/20/2023   Pneumococcal Conjugate-13 09/26/2015   Pneumococcal Polysaccharide-23 10/08/1998, 07/08/2018   Respiratory Syncytial Virus Vaccine ,Recomb Aduvanted(Arexvy ) 07/05/2022   Td 10/08/2002   Td (Adult), 2 Lf Tetanus Toxid, Preservative Free 10/08/2002   Tdap 09/10/2016    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)   Anal intraepithelial neoplasia II (AIN II) 2017   Anemia 04/06/22   Aortic atherosclerosis (HCC)    B12 deficiency    Blood transfusion without reported diagnosis 6/23   Bronchitis    Cancer (HCC)    skin cancer on chest   Cataract 5/23   Chest pain 06/12/2016   Cholelithiasis    Colon polyps    Complication of anesthesia 05/16/2021   pt states was given too much during nasal surgery 1989; difficulty getting awake   COPD (chronic obstructive pulmonary disease) (HCC)    Coronary artery calcification    Diverticulitis 05/16/2021   occasional   Diverticulosis    Dyspnea 06/27/2016   Emphysema of lung (HCC)    External hemorrhoids    GERD (gastroesophageal reflux disease)    occasional   Hemorrhoids    High cholesterol    Hyperlipidemia LDL goal <70    Iron deficiency    Mild aortic insufficiency    Neuropathy 04/07/2022   Osteoporosis    Oxygen  deficiency    Pneumonia    Shortness of breath dyspnea    with exertion    Thyroid  goiter    bx benign   Tobacco abuse    Vertigo 04/21/2017    Tobacco History: Social History   Tobacco Use  Smoking Status Former   Current packs/day: 0.00   Average packs/day: 1.5 packs/day for 50.0 years (75.0 ttl pk-yrs)   Types: Cigarettes   Start date: 11/03/1968   Quit date: 11/03/2018   Years since quitting: 5.4  Smokeless Tobacco Never   Counseling given: Not  Answered   Outpatient Medications Prior to Visit  Medication Sig Dispense Refill   albuterol  (PROVENTIL ) (2.5 MG/3ML) 0.083% nebulizer solution Take 2.5 mg by nebulization every 6 (six) hours as needed for wheezing or shortness of breath.     atorvastatin  (LIPITOR) 40 MG tablet Take 1 tablet (40 mg total) by mouth daily. 90 tablet 3   azithromycin  (ZITHROMAX ) 250 MG tablet Take 1 tablet (250 mg total) by mouth daily. 30 tablet 0   bismuth subsalicylate (PEPTO BISMOL) 262 MG/15ML suspension Take 30 mLs by mouth every 6 (six) hours as needed for indigestion.     BREO ELLIPTA  200-25 MCG/ACT AEPB INHALE 1 PUFF EVERY DAY (Patient taking differently: Inhale 1 puff into the lungs daily.) 180 each 3   ferrous sulfate  325 (65 FE) MG EC tablet Take 325 mg by mouth every other day. Pt states she is only taking once a week due to constipation     fluticasone  (FLONASE ) 50 MCG/ACT nasal spray Place 2 sprays into both nostrils daily. 16 g 6   loratadine  (CLARITIN ) 10 MG tablet Take 1 tablet (10 mg total) by mouth daily. *Please keep upcoming appt for additional refills.* 100 tablet 1   nitroGLYCERIN  (NITROSTAT ) 0.4 MG SL tablet Place 1  tablet (0.4 mg total) under the tongue every 5 (five) minutes as needed for chest pain. 25 tablet 6   OXYGEN  Inhale 3 L/min into the lungs continuous.     SPIRIVA  RESPIMAT 2.5 MCG/ACT AERS INHALE 2 PUFFS EVERY DAY (Patient taking differently: Inhale 2 puffs into the lungs daily.) 12 g 3   vitamin B-12 (CYANOCOBALAMIN ) 100 MCG tablet Take 100 mcg by mouth daily.     No facility-administered medications prior to visit.     Review of Systems:   Constitutional: No weight loss or gain, night sweats, fevers, chills, or lassitude. +fatigue  HEENT: No headaches, difficulty swallowing, tooth/dental problems, or sore throat. No sneezing, itching, ear ache, nasal congestion, or post nasal drip CV:  No chest pain, orthopnea, PND, swelling in lower extremities, anasarca, dizziness,  palpitations, syncope Resp: +shortness of breath with exertion; productive cough (baseline). No wheezing. No hemoptysis.  No chest wall deformity GI:  +bloating. No heartburn, indigestion, abdominal pain, nausea, vomiting, diarrhea, change in bowel habits, loss of appetite, bloody stools.  MSK:  No joint pain or swelling.  No decreased range of motion.  No back pain. Neuro: No dizziness or lightheadedness.  Psych: No depression or anxiety. Mood stable.     Physical Exam:  BP 130/60 (BP Location: Left Arm, Patient Position: Sitting, Cuff Size: Normal)   Pulse (!) 105   Ht 5' 4 (1.626 m)   Wt 103 lb 6.4 oz (46.9 kg)   SpO2 96%   BMI 17.75 kg/m   GEN: Pleasant, interactive, chronically-ill appearing; in no acute distress HEENT:  Normocephalic and atraumatic. PERRLA. Sclera white. Nasal turbinates pink, moist and patent bilaterally. No rhinorrhea present. Oropharynx pink and moist, without exudate or edema. No lesions, ulcerations, or postnasal drip.  NECK:  Supple w/ fair ROM.Thyroid  symmetrical with no goiter or nodules palpated. No lymphadenopathy.   CV: RRR, no m/r/g, no peripheral edema. Pulses intact, +2 bilaterally. No cyanosis, pallor or clubbing. PULMONARY:  Unlabored, regular breathing. Diminished bilaterally A&P w/o wheezes/rales/rhonchi. No accessory muscle use. No dullness to percussion. GI: BS present and normoactive. Soft, non-tender to palpation. No organomegaly or masses detected.  MSK: No erythema, warmth or tenderness. Cap refil <2 sec all extrem. No deformities or joint swelling noted. Muscle wasting Neuro: A/Ox3. No focal deficits noted.   Skin: Warm, no lesions or rashe Psych: Normal affect and behavior. Judgement and thought content appropriate.     Lab Results:  CBC    Component Value Date/Time   WBC 8.9 02/06/2024 1202   WBC 11.1 (H) 05/23/2023 1216   RBC 3.96 02/06/2024 1202   RBC 3.85 (L) 05/23/2023 1216   HGB 12.2 02/06/2024 1202   HCT 38.0  02/06/2024 1202   PLT 299 02/06/2024 1202   MCV 96 02/06/2024 1202   MCH 30.8 02/06/2024 1202   MCH 29.4 05/23/2023 1216   MCHC 32.1 02/06/2024 1202   MCHC 30.9 05/23/2023 1216   RDW 13.1 02/06/2024 1202   LYMPHSABS 1.3 02/06/2024 1202   MONOABS 0.9 04/22/2023 1135   EOSABS 0.3 02/06/2024 1202   BASOSABS 0.1 02/06/2024 1202    BMET    Component Value Date/Time   NA 142 02/06/2024 1202   K 4.4 02/06/2024 1202   CL 103 02/06/2024 1202   CO2 25 02/06/2024 1202   GLUCOSE 88 02/06/2024 1202   GLUCOSE 89 05/29/2023 1128   BUN 9 02/06/2024 1202   CREATININE 0.51 (L) 02/06/2024 1202   CREATININE 0.60 09/05/2016 1218   CALCIUM  9.5  02/06/2024 1202   GFRNONAA >60 05/29/2023 1128   GFRNONAA >89 03/26/2016 1022   GFRAA 109 11/18/2020 1201   GFRAA >89 03/26/2016 1022    BNP    Component Value Date/Time   BNP 61.4 05/21/2023 1616     Imaging:  DG Chest 2 View Result Date: 04/08/2024 CLINICAL DATA:  Shortness of breath EXAM: CHEST - 2 VIEW COMPARISON:  X-ray 05/21/2023 and CTA FINDINGS: Hyperinflation with chronic lung changes again identified. No consolidation, pneumothorax or effusion. No edema. Normal cardiopericardial silhouette. Calcified aorta. Degenerative changes along the spine with some compression deformities, unchanged from previous examination. IMPRESSION: Hyperinflation with chronic changes. Electronically Signed   By: Ranell Bring M.D.   On: 04/08/2024 10:12    methylPREDNISolone  acetate (DEPO-MEDROL ) injection 80 mg     Date Action Dose Route User   04/08/2024 1008 Given 80 mg Intramuscular (Left Ventrogluteal) Moody Rancher, CMA          Latest Ref Rng & Units 07/21/2015    9:33 AM  PFT Results  FVC-Pre L 2.06   FVC-Predicted Pre % 66   FVC-Post L 2.23   FVC-Predicted Post % 72   Pre FEV1/FVC % % 45   Post FEV1/FCV % % 46   FEV1-Pre L 0.93   FEV1-Predicted Pre % 39   FEV1-Post L 1.03   DLCO uncorrected ml/min/mmHg 8.56   DLCO UNC% % 36   DLVA  Predicted % 39     No results found for: NITRICOXIDE      Assessment & Plan:   COPD with acute exacerbation (HCC) Mild AECOPD exacerbated by viral illness.  Slowly improving.  Still having some increased dyspnea from baseline.  Will treat her with a short prednisone  burst and 80 mg Depo injection x 1.  Hold on abx therapy due to no change in sputum production/color. She has high symptom burden at baseline.  Has had difficulties tolerating Daliresp  in the past.  She has been on azithromycin  for many years - will d/c as unsure if she has clinical benefit and risk of chronic macrolide therapy. Will start her on inhaled phosphodiesterase inhibitor with Ohtuvayre .  Side effect profile reviewed.  Enrollment paperwork completed. CXR today. Action plan in place.  Patient Instructions  Continue Albuterol  inhaler 2 puffs or 3 mL neb every 6 hours as needed for shortness of breath or wheezing. Notify if symptoms persist despite rescue inhaler/neb use.  Continue breo 1 puff daily. Brush tongue and rinse mouth afterwards Continue spiriva  2 puffs daily Continue flonase  nasal spray 2 sprays each nostril daily  Continue claritin  1 tab daily Continue supplemental oxygen  3-4 lpm for goal >88-90%  -Prednisone  20 mg daily for 5 days. Take in AM with food. Start tomorrow -Ohtuvayre  nebulizer treatment Twice daily. This will be an additional maintenance medication that you use, regardless of how you feel. It is not meant for rescue  -Stop azithromycin    Chest x ray today  Steroid shot today   Follow up in 3-4 weeks with Dr. Theophilus or Katie Vergia Chea,NP. If symptoms do not improve or worsen, please contact office for sooner follow up or seek emergency care.    Chronic respiratory failure with hypoxia (HCC) Slight increase in O2 requirement. Likely due to AECOPD. Will reassess at follow up. If she continues to have hypoxia in AM, will obtain ABG and consider NIV for nocturnal use. Continue supplemental O2 for  goal >88-90%      I spent 35 minutes of dedicated to the care  of this patient on the date of this encounter to include pre-visit review of records, face-to-face time with the patient discussing conditions above, post visit ordering of testing, clinical documentation with the electronic health record, making appropriate referrals as documented, and communicating necessary findings to members of the patients care team.  Comer LULLA Rouleau, NP 04/08/2024  Pt aware and understands NP's role.

## 2024-04-08 NOTE — Patient Instructions (Addendum)
 Continue Albuterol  inhaler 2 puffs or 3 mL neb every 6 hours as needed for shortness of breath or wheezing. Notify if symptoms persist despite rescue inhaler/neb use.  Continue breo 1 puff daily. Brush tongue and rinse mouth afterwards Continue spiriva  2 puffs daily Continue flonase  nasal spray 2 sprays each nostril daily  Continue claritin  1 tab daily Continue supplemental oxygen  3-4 lpm for goal >88-90%  -Prednisone  20 mg daily for 5 days. Take in AM with food. Start tomorrow -Ohtuvayre  nebulizer treatment Twice daily. This will be an additional maintenance medication that you use, regardless of how you feel. It is not meant for rescue  -Stop azithromycin    Chest x ray today  Steroid shot today   Follow up in 3-4 weeks with Dr. Theophilus or Katie Abbie Berling,NP. If symptoms do not improve or worsen, please contact office for sooner follow up or seek emergency care.

## 2024-04-08 NOTE — Assessment & Plan Note (Signed)
 Mild AECOPD exacerbated by viral illness.  Slowly improving.  Still having some increased dyspnea from baseline.  Will treat her with a short prednisone  burst and 80 mg Depo injection x 1.  Hold on abx therapy due to no change in sputum production/color. She has high symptom burden at baseline.  Has had difficulties tolerating Daliresp  in the past.  She has been on azithromycin  for many years - will d/c as unsure if she has clinical benefit and risk of chronic macrolide therapy. Will start her on inhaled phosphodiesterase inhibitor with Ohtuvayre .  Side effect profile reviewed.  Enrollment paperwork completed. CXR today. Action plan in place.  Patient Instructions  Continue Albuterol  inhaler 2 puffs or 3 mL neb every 6 hours as needed for shortness of breath or wheezing. Notify if symptoms persist despite rescue inhaler/neb use.  Continue breo 1 puff daily. Brush tongue and rinse mouth afterwards Continue spiriva  2 puffs daily Continue flonase  nasal spray 2 sprays each nostril daily  Continue claritin  1 tab daily Continue supplemental oxygen  3-4 lpm for goal >88-90%  -Prednisone  20 mg daily for 5 days. Take in AM with food. Start tomorrow -Ohtuvayre  nebulizer treatment Twice daily. This will be an additional maintenance medication that you use, regardless of how you feel. It is not meant for rescue  -Stop azithromycin    Chest x ray today  Steroid shot today   Follow up in 3-4 weeks with Dr. Theophilus or Katie Brinly Maietta,NP. If symptoms do not improve or worsen, please contact office for sooner follow up or seek emergency care.

## 2024-04-08 NOTE — Assessment & Plan Note (Signed)
 Slight increase in O2 requirement. Likely due to AECOPD. Will reassess at follow up. If she continues to have hypoxia in AM, will obtain ABG and consider NIV for nocturnal use. Continue supplemental O2 for goal >88-90%

## 2024-04-09 DIAGNOSIS — E46 Unspecified protein-calorie malnutrition: Secondary | ICD-10-CM | POA: Diagnosis not present

## 2024-04-09 DIAGNOSIS — J9611 Chronic respiratory failure with hypoxia: Secondary | ICD-10-CM | POA: Diagnosis not present

## 2024-04-09 DIAGNOSIS — G629 Polyneuropathy, unspecified: Secondary | ICD-10-CM | POA: Diagnosis not present

## 2024-04-09 DIAGNOSIS — J449 Chronic obstructive pulmonary disease, unspecified: Secondary | ICD-10-CM | POA: Diagnosis not present

## 2024-04-09 DIAGNOSIS — I2584 Coronary atherosclerosis due to calcified coronary lesion: Secondary | ICD-10-CM | POA: Diagnosis not present

## 2024-04-09 DIAGNOSIS — J9612 Chronic respiratory failure with hypercapnia: Secondary | ICD-10-CM | POA: Diagnosis not present

## 2024-04-09 DIAGNOSIS — I2510 Atherosclerotic heart disease of native coronary artery without angina pectoris: Secondary | ICD-10-CM | POA: Diagnosis not present

## 2024-04-09 DIAGNOSIS — M810 Age-related osteoporosis without current pathological fracture: Secondary | ICD-10-CM | POA: Diagnosis not present

## 2024-04-09 DIAGNOSIS — I10 Essential (primary) hypertension: Secondary | ICD-10-CM | POA: Diagnosis not present

## 2024-04-12 DIAGNOSIS — I10 Essential (primary) hypertension: Secondary | ICD-10-CM | POA: Diagnosis not present

## 2024-04-12 DIAGNOSIS — E46 Unspecified protein-calorie malnutrition: Secondary | ICD-10-CM | POA: Diagnosis not present

## 2024-04-12 DIAGNOSIS — J449 Chronic obstructive pulmonary disease, unspecified: Secondary | ICD-10-CM | POA: Diagnosis not present

## 2024-04-12 DIAGNOSIS — M810 Age-related osteoporosis without current pathological fracture: Secondary | ICD-10-CM | POA: Diagnosis not present

## 2024-04-12 DIAGNOSIS — G629 Polyneuropathy, unspecified: Secondary | ICD-10-CM | POA: Diagnosis not present

## 2024-04-12 DIAGNOSIS — I2510 Atherosclerotic heart disease of native coronary artery without angina pectoris: Secondary | ICD-10-CM | POA: Diagnosis not present

## 2024-04-12 DIAGNOSIS — J9612 Chronic respiratory failure with hypercapnia: Secondary | ICD-10-CM | POA: Diagnosis not present

## 2024-04-12 DIAGNOSIS — I2584 Coronary atherosclerosis due to calcified coronary lesion: Secondary | ICD-10-CM | POA: Diagnosis not present

## 2024-04-12 DIAGNOSIS — J9611 Chronic respiratory failure with hypoxia: Secondary | ICD-10-CM | POA: Diagnosis not present

## 2024-04-13 ENCOUNTER — Other Ambulatory Visit (HOSPITAL_BASED_OUTPATIENT_CLINIC_OR_DEPARTMENT_OTHER): Payer: Self-pay

## 2024-04-13 NOTE — Telephone Encounter (Signed)
 HH order ok'd patient last seen in office 02/06/2024,.  Order given to Gracie - [facility]  Callback Number: [phone] - Secured Line  Service Requested: Physical Therapy, Skilled Nursing  Frequency: 1 time a week for 9 weeks. Bathing - 1 time a week for 9 weeks as well.

## 2024-04-15 ENCOUNTER — Telehealth: Payer: Self-pay | Admitting: Pharmacist

## 2024-04-15 ENCOUNTER — Other Ambulatory Visit (HOSPITAL_BASED_OUTPATIENT_CLINIC_OR_DEPARTMENT_OTHER): Payer: Self-pay

## 2024-04-15 NOTE — Telephone Encounter (Signed)
 Received Ohtuvayre new start paperwork. Completed form and faxed with clinicals and insurance card copy to [facility]   Phone#: [phone] Fax#: [phone]

## 2024-04-16 NOTE — Telephone Encounter (Signed)
 Received fax from [employer] confirming receipt of Ohtuvayre  enrollment form   Patient ID: [id number]

## 2024-04-17 DIAGNOSIS — G629 Polyneuropathy, unspecified: Secondary | ICD-10-CM | POA: Diagnosis not present

## 2024-04-17 DIAGNOSIS — I2584 Coronary atherosclerosis due to calcified coronary lesion: Secondary | ICD-10-CM | POA: Diagnosis not present

## 2024-04-17 DIAGNOSIS — J9611 Chronic respiratory failure with hypoxia: Secondary | ICD-10-CM | POA: Diagnosis not present

## 2024-04-17 DIAGNOSIS — I2510 Atherosclerotic heart disease of native coronary artery without angina pectoris: Secondary | ICD-10-CM | POA: Diagnosis not present

## 2024-04-17 DIAGNOSIS — E46 Unspecified protein-calorie malnutrition: Secondary | ICD-10-CM | POA: Diagnosis not present

## 2024-04-17 DIAGNOSIS — M810 Age-related osteoporosis without current pathological fracture: Secondary | ICD-10-CM | POA: Diagnosis not present

## 2024-04-17 DIAGNOSIS — J449 Chronic obstructive pulmonary disease, unspecified: Secondary | ICD-10-CM | POA: Diagnosis not present

## 2024-04-17 DIAGNOSIS — J9612 Chronic respiratory failure with hypercapnia: Secondary | ICD-10-CM | POA: Diagnosis not present

## 2024-04-17 DIAGNOSIS — I10 Essential (primary) hypertension: Secondary | ICD-10-CM | POA: Diagnosis not present

## 2024-04-17 NOTE — Telephone Encounter (Signed)
 Received fax from [employer] with summary of benefits. Referral form for Ohtuvayre  received. Rx will be triaged to [facility].. Once benefits investigation completed, pharmacy will reach out the patient to schedule shipment. If medication is unaffordable, patient will need to express financial hardship to be referred back to [place] [facility] for patient assistance program pre-screening.   Patient ID: [id number] Pharmacy phone: [phone] [place] [facility] Phone#: [phone]

## 2024-04-22 DIAGNOSIS — I2510 Atherosclerotic heart disease of native coronary artery without angina pectoris: Secondary | ICD-10-CM | POA: Diagnosis not present

## 2024-04-22 DIAGNOSIS — J449 Chronic obstructive pulmonary disease, unspecified: Secondary | ICD-10-CM | POA: Diagnosis not present

## 2024-04-22 DIAGNOSIS — M810 Age-related osteoporosis without current pathological fracture: Secondary | ICD-10-CM | POA: Diagnosis not present

## 2024-04-22 DIAGNOSIS — G629 Polyneuropathy, unspecified: Secondary | ICD-10-CM | POA: Diagnosis not present

## 2024-04-22 DIAGNOSIS — E46 Unspecified protein-calorie malnutrition: Secondary | ICD-10-CM | POA: Diagnosis not present

## 2024-04-22 DIAGNOSIS — J9611 Chronic respiratory failure with hypoxia: Secondary | ICD-10-CM | POA: Diagnosis not present

## 2024-04-22 DIAGNOSIS — I10 Essential (primary) hypertension: Secondary | ICD-10-CM | POA: Diagnosis not present

## 2024-04-22 DIAGNOSIS — I2584 Coronary atherosclerosis due to calcified coronary lesion: Secondary | ICD-10-CM | POA: Diagnosis not present

## 2024-04-22 DIAGNOSIS — J9612 Chronic respiratory failure with hypercapnia: Secondary | ICD-10-CM | POA: Diagnosis not present

## 2024-04-23 DIAGNOSIS — I2584 Coronary atherosclerosis due to calcified coronary lesion: Secondary | ICD-10-CM | POA: Diagnosis not present

## 2024-04-23 DIAGNOSIS — I2510 Atherosclerotic heart disease of native coronary artery without angina pectoris: Secondary | ICD-10-CM | POA: Diagnosis not present

## 2024-04-23 DIAGNOSIS — M810 Age-related osteoporosis without current pathological fracture: Secondary | ICD-10-CM | POA: Diagnosis not present

## 2024-04-23 DIAGNOSIS — J9611 Chronic respiratory failure with hypoxia: Secondary | ICD-10-CM | POA: Diagnosis not present

## 2024-04-23 DIAGNOSIS — I10 Essential (primary) hypertension: Secondary | ICD-10-CM | POA: Diagnosis not present

## 2024-04-23 DIAGNOSIS — G629 Polyneuropathy, unspecified: Secondary | ICD-10-CM | POA: Diagnosis not present

## 2024-04-23 DIAGNOSIS — J449 Chronic obstructive pulmonary disease, unspecified: Secondary | ICD-10-CM | POA: Diagnosis not present

## 2024-04-23 DIAGNOSIS — E46 Unspecified protein-calorie malnutrition: Secondary | ICD-10-CM | POA: Diagnosis not present

## 2024-04-23 DIAGNOSIS — J9612 Chronic respiratory failure with hypercapnia: Secondary | ICD-10-CM | POA: Diagnosis not present

## 2024-04-29 DIAGNOSIS — I2510 Atherosclerotic heart disease of native coronary artery without angina pectoris: Secondary | ICD-10-CM | POA: Diagnosis not present

## 2024-04-29 DIAGNOSIS — M810 Age-related osteoporosis without current pathological fracture: Secondary | ICD-10-CM | POA: Diagnosis not present

## 2024-04-29 DIAGNOSIS — J9611 Chronic respiratory failure with hypoxia: Secondary | ICD-10-CM | POA: Diagnosis not present

## 2024-04-29 DIAGNOSIS — E46 Unspecified protein-calorie malnutrition: Secondary | ICD-10-CM | POA: Diagnosis not present

## 2024-04-29 DIAGNOSIS — J449 Chronic obstructive pulmonary disease, unspecified: Secondary | ICD-10-CM | POA: Diagnosis not present

## 2024-04-29 DIAGNOSIS — I2584 Coronary atherosclerosis due to calcified coronary lesion: Secondary | ICD-10-CM | POA: Diagnosis not present

## 2024-04-29 DIAGNOSIS — I10 Essential (primary) hypertension: Secondary | ICD-10-CM | POA: Diagnosis not present

## 2024-04-29 DIAGNOSIS — G629 Polyneuropathy, unspecified: Secondary | ICD-10-CM | POA: Diagnosis not present

## 2024-04-29 DIAGNOSIS — J9612 Chronic respiratory failure with hypercapnia: Secondary | ICD-10-CM | POA: Diagnosis not present

## 2024-05-04 ENCOUNTER — Other Ambulatory Visit: Payer: Self-pay | Admitting: Pulmonary Disease

## 2024-05-04 ENCOUNTER — Other Ambulatory Visit (HOSPITAL_BASED_OUTPATIENT_CLINIC_OR_DEPARTMENT_OTHER): Payer: Self-pay

## 2024-05-05 ENCOUNTER — Other Ambulatory Visit: Payer: Self-pay | Admitting: Pulmonary Disease

## 2024-05-05 ENCOUNTER — Other Ambulatory Visit: Payer: Self-pay | Admitting: Family Medicine

## 2024-05-05 ENCOUNTER — Other Ambulatory Visit (HOSPITAL_BASED_OUTPATIENT_CLINIC_OR_DEPARTMENT_OTHER): Payer: Self-pay

## 2024-05-05 DIAGNOSIS — G629 Polyneuropathy, unspecified: Secondary | ICD-10-CM | POA: Diagnosis not present

## 2024-05-05 DIAGNOSIS — J9611 Chronic respiratory failure with hypoxia: Secondary | ICD-10-CM | POA: Diagnosis not present

## 2024-05-05 DIAGNOSIS — J3089 Other allergic rhinitis: Secondary | ICD-10-CM

## 2024-05-05 DIAGNOSIS — M810 Age-related osteoporosis without current pathological fracture: Secondary | ICD-10-CM | POA: Diagnosis not present

## 2024-05-05 DIAGNOSIS — I2510 Atherosclerotic heart disease of native coronary artery without angina pectoris: Secondary | ICD-10-CM | POA: Diagnosis not present

## 2024-05-05 DIAGNOSIS — I10 Essential (primary) hypertension: Secondary | ICD-10-CM | POA: Diagnosis not present

## 2024-05-05 DIAGNOSIS — E46 Unspecified protein-calorie malnutrition: Secondary | ICD-10-CM | POA: Diagnosis not present

## 2024-05-05 DIAGNOSIS — J9612 Chronic respiratory failure with hypercapnia: Secondary | ICD-10-CM | POA: Diagnosis not present

## 2024-05-05 DIAGNOSIS — I2584 Coronary atherosclerosis due to calcified coronary lesion: Secondary | ICD-10-CM | POA: Diagnosis not present

## 2024-05-05 DIAGNOSIS — J449 Chronic obstructive pulmonary disease, unspecified: Secondary | ICD-10-CM | POA: Diagnosis not present

## 2024-05-05 NOTE — Telephone Encounter (Signed)
 Margaret Henry

## 2024-05-06 ENCOUNTER — Ambulatory Visit (HOSPITAL_BASED_OUTPATIENT_CLINIC_OR_DEPARTMENT_OTHER)
Admission: RE | Admit: 2024-05-06 | Discharge: 2024-05-06 | Disposition: A | Discharge status: Home / Self Care | Source: Ambulatory Visit | Attending: Family Medicine | Admitting: Family Medicine

## 2024-05-06 ENCOUNTER — Other Ambulatory Visit (HOSPITAL_BASED_OUTPATIENT_CLINIC_OR_DEPARTMENT_OTHER): Payer: Self-pay

## 2024-05-06 DIAGNOSIS — I2584 Coronary atherosclerosis due to calcified coronary lesion: Secondary | ICD-10-CM | POA: Diagnosis not present

## 2024-05-06 DIAGNOSIS — Z122 Encounter for screening for malignant neoplasm of respiratory organs: Secondary | ICD-10-CM | POA: Diagnosis not present

## 2024-05-06 DIAGNOSIS — I10 Essential (primary) hypertension: Secondary | ICD-10-CM | POA: Diagnosis not present

## 2024-05-06 DIAGNOSIS — I2510 Atherosclerotic heart disease of native coronary artery without angina pectoris: Secondary | ICD-10-CM | POA: Diagnosis not present

## 2024-05-06 DIAGNOSIS — Z87891 Personal history of nicotine dependence: Secondary | ICD-10-CM | POA: Insufficient documentation

## 2024-05-06 DIAGNOSIS — G629 Polyneuropathy, unspecified: Secondary | ICD-10-CM | POA: Diagnosis not present

## 2024-05-06 DIAGNOSIS — M810 Age-related osteoporosis without current pathological fracture: Secondary | ICD-10-CM | POA: Diagnosis not present

## 2024-05-06 DIAGNOSIS — J449 Chronic obstructive pulmonary disease, unspecified: Secondary | ICD-10-CM | POA: Diagnosis not present

## 2024-05-06 DIAGNOSIS — E46 Unspecified protein-calorie malnutrition: Secondary | ICD-10-CM | POA: Diagnosis not present

## 2024-05-06 DIAGNOSIS — J9611 Chronic respiratory failure with hypoxia: Secondary | ICD-10-CM | POA: Diagnosis not present

## 2024-05-06 DIAGNOSIS — J9612 Chronic respiratory failure with hypercapnia: Secondary | ICD-10-CM | POA: Diagnosis not present

## 2024-05-06 MED ORDER — LORATADINE 10 MG PO TABS
10.0000 mg | ORAL_TABLET | Freq: Every day | ORAL | 0 refills | Status: DC
  Filled 2024-05-06: qty 100, 100d supply, fill #0

## 2024-05-12 DIAGNOSIS — I2584 Coronary atherosclerosis due to calcified coronary lesion: Secondary | ICD-10-CM | POA: Diagnosis not present

## 2024-05-12 DIAGNOSIS — I2510 Atherosclerotic heart disease of native coronary artery without angina pectoris: Secondary | ICD-10-CM | POA: Diagnosis not present

## 2024-05-12 DIAGNOSIS — J9611 Chronic respiratory failure with hypoxia: Secondary | ICD-10-CM | POA: Diagnosis not present

## 2024-05-12 DIAGNOSIS — J449 Chronic obstructive pulmonary disease, unspecified: Secondary | ICD-10-CM | POA: Diagnosis not present

## 2024-05-12 DIAGNOSIS — M810 Age-related osteoporosis without current pathological fracture: Secondary | ICD-10-CM | POA: Diagnosis not present

## 2024-05-12 DIAGNOSIS — E46 Unspecified protein-calorie malnutrition: Secondary | ICD-10-CM | POA: Diagnosis not present

## 2024-05-12 DIAGNOSIS — G629 Polyneuropathy, unspecified: Secondary | ICD-10-CM | POA: Diagnosis not present

## 2024-05-12 DIAGNOSIS — I10 Essential (primary) hypertension: Secondary | ICD-10-CM | POA: Diagnosis not present

## 2024-05-12 DIAGNOSIS — J9612 Chronic respiratory failure with hypercapnia: Secondary | ICD-10-CM | POA: Diagnosis not present

## 2024-05-13 DIAGNOSIS — J9612 Chronic respiratory failure with hypercapnia: Secondary | ICD-10-CM | POA: Diagnosis not present

## 2024-05-13 DIAGNOSIS — E46 Unspecified protein-calorie malnutrition: Secondary | ICD-10-CM | POA: Diagnosis not present

## 2024-05-13 DIAGNOSIS — M810 Age-related osteoporosis without current pathological fracture: Secondary | ICD-10-CM | POA: Diagnosis not present

## 2024-05-13 DIAGNOSIS — J9611 Chronic respiratory failure with hypoxia: Secondary | ICD-10-CM | POA: Diagnosis not present

## 2024-05-13 DIAGNOSIS — I2584 Coronary atherosclerosis due to calcified coronary lesion: Secondary | ICD-10-CM | POA: Diagnosis not present

## 2024-05-13 DIAGNOSIS — I10 Essential (primary) hypertension: Secondary | ICD-10-CM | POA: Diagnosis not present

## 2024-05-13 DIAGNOSIS — G629 Polyneuropathy, unspecified: Secondary | ICD-10-CM | POA: Diagnosis not present

## 2024-05-13 DIAGNOSIS — J449 Chronic obstructive pulmonary disease, unspecified: Secondary | ICD-10-CM | POA: Diagnosis not present

## 2024-05-13 DIAGNOSIS — I2510 Atherosclerotic heart disease of native coronary artery without angina pectoris: Secondary | ICD-10-CM | POA: Diagnosis not present

## 2024-05-14 DIAGNOSIS — G629 Polyneuropathy, unspecified: Secondary | ICD-10-CM | POA: Diagnosis not present

## 2024-05-14 DIAGNOSIS — I10 Essential (primary) hypertension: Secondary | ICD-10-CM | POA: Diagnosis not present

## 2024-05-14 DIAGNOSIS — J9612 Chronic respiratory failure with hypercapnia: Secondary | ICD-10-CM | POA: Diagnosis not present

## 2024-05-14 DIAGNOSIS — M810 Age-related osteoporosis without current pathological fracture: Secondary | ICD-10-CM | POA: Diagnosis not present

## 2024-05-14 DIAGNOSIS — I2584 Coronary atherosclerosis due to calcified coronary lesion: Secondary | ICD-10-CM | POA: Diagnosis not present

## 2024-05-14 DIAGNOSIS — J9611 Chronic respiratory failure with hypoxia: Secondary | ICD-10-CM | POA: Diagnosis not present

## 2024-05-14 DIAGNOSIS — I2510 Atherosclerotic heart disease of native coronary artery without angina pectoris: Secondary | ICD-10-CM | POA: Diagnosis not present

## 2024-05-14 DIAGNOSIS — J449 Chronic obstructive pulmonary disease, unspecified: Secondary | ICD-10-CM | POA: Diagnosis not present

## 2024-05-14 DIAGNOSIS — E46 Unspecified protein-calorie malnutrition: Secondary | ICD-10-CM | POA: Diagnosis not present

## 2024-05-15 ENCOUNTER — Telehealth: Payer: Self-pay

## 2024-05-15 ENCOUNTER — Other Ambulatory Visit: Payer: Self-pay | Admitting: Nurse Practitioner

## 2024-05-15 NOTE — Telephone Encounter (Signed)
 Copied from CRM [id number]. Topic: Clinical - Medication Refill >> May 15, 2024 10:32 AM Celestine FALCON wrote: Medication: azithromycin  (ZITHROMAX ) 250 MG tablet  Has the patient contacted their pharmacy? Yes (Agent: If no, request that the patient contact the pharmacy for the refill. If patient does not wish to contact the pharmacy document the reason why and proceed with request.) (Agent: If yes, when and what did the pharmacy advise?)  This is the patient's preferred pharmacy:  [facility] [place] [facility] [address], [address] [place] [address] Phone: [phone] Fax: [phone]  Is this the correct pharmacy for this prescription? Yes If no, delete pharmacy and type the correct one.   Has the prescription been filled recently? Yes; 01/14/2024  Is the patient out of the medication? Yes  Has the patient been seen for an appointment in the last year OR does the patient have an upcoming appointment? Yes  Can we respond through MyChart? Yes  Agent: Please be advised that Rx refills may take up to 3 business days. We ask that you follow-up with your pharmacy.

## 2024-05-15 NOTE — Telephone Encounter (Signed)
 Copied from CRM [id number]. Topic: Clinical - Lab/Test Results >> May 15, 2024 10:29 AM Celestine FALCON wrote: Reason for CRM: Pt is calling to receive the results from her CT CHEST LUNG CA SCREEN LOW DOSE W/O CM (Accession [id number]) (Order [id number]) completed on 7/30. I let the pt know that there's no results as of yet, but when they are in she will receive a call.   Pt's phone number is [phone] ok to leave a vm.  Please advise LCS CT results once available. Thank you!

## 2024-05-19 ENCOUNTER — Telehealth: Payer: Self-pay | Admitting: Acute Care

## 2024-05-19 NOTE — Telephone Encounter (Signed)
 I stopped this at her last OV

## 2024-05-19 NOTE — Telephone Encounter (Signed)
 Left VM and call back number to call for results of LDCT.  LR0 - recent COPD flare with OV at [facility].

## 2024-05-19 NOTE — Telephone Encounter (Signed)
 I called the pt and there was no answer- LMTCB. ?

## 2024-05-19 NOTE — Telephone Encounter (Signed)
 Spoke with patient to review results of LDCT.  LR0 and patient had OV with K. Cobb, NP on 04/08/24 for COPD exacerbation.  Added new med for nebulizer per pt.  Patient denies any increased congestion in her lungs now and no new signs and symptoms of illness. She confirms she feels 'pretty much back to my normal.'  Explained the scan showed some congestion and inflammation that affected the results.  Recommendation to repeat in 1-2 months after the last scan.  Also advised of cholelithiasis, emphysema and atherosclerosis which have all been previously noted.  Patient confirms she takes statin medication.  Patient acknowledged understanding of results and had no questions. She states she called today to requests she be put back on the azithromycin  that she was taking ongoing as 'preventive' measure for lung infections.  Will message Comer Rouleau, NP that results of LDCT are available if she would like to review.  Also will sent results to PCP.  Will place new order for follow up once Lauraine Lites, NP reviews and recommends follow up date for CT.

## 2024-05-20 ENCOUNTER — Other Ambulatory Visit: Payer: Self-pay

## 2024-05-20 DIAGNOSIS — I2510 Atherosclerotic heart disease of native coronary artery without angina pectoris: Secondary | ICD-10-CM | POA: Diagnosis not present

## 2024-05-20 DIAGNOSIS — J9612 Chronic respiratory failure with hypercapnia: Secondary | ICD-10-CM | POA: Diagnosis not present

## 2024-05-20 DIAGNOSIS — Z87891 Personal history of nicotine dependence: Secondary | ICD-10-CM

## 2024-05-20 DIAGNOSIS — E46 Unspecified protein-calorie malnutrition: Secondary | ICD-10-CM | POA: Diagnosis not present

## 2024-05-20 DIAGNOSIS — Z122 Encounter for screening for malignant neoplasm of respiratory organs: Secondary | ICD-10-CM

## 2024-05-20 DIAGNOSIS — J9611 Chronic respiratory failure with hypoxia: Secondary | ICD-10-CM | POA: Diagnosis not present

## 2024-05-20 DIAGNOSIS — G629 Polyneuropathy, unspecified: Secondary | ICD-10-CM | POA: Diagnosis not present

## 2024-05-20 DIAGNOSIS — J449 Chronic obstructive pulmonary disease, unspecified: Secondary | ICD-10-CM | POA: Diagnosis not present

## 2024-05-20 DIAGNOSIS — I2584 Coronary atherosclerosis due to calcified coronary lesion: Secondary | ICD-10-CM | POA: Diagnosis not present

## 2024-05-20 DIAGNOSIS — M810 Age-related osteoporosis without current pathological fracture: Secondary | ICD-10-CM | POA: Diagnosis not present

## 2024-05-20 DIAGNOSIS — I10 Essential (primary) hypertension: Secondary | ICD-10-CM | POA: Diagnosis not present

## 2024-05-20 NOTE — Telephone Encounter (Signed)
 Results discussed with Lauraine Lites, NP.  Repeat LDCT in 8 weeks.

## 2024-05-20 NOTE — Telephone Encounter (Signed)
 Spoke with patient. Advised 8 week follow up CT needed. Scheduled for 07/08/2024 at [facility] at 10:30 am. Order placed. Results and plan to PCP. NFN.

## 2024-05-20 NOTE — Telephone Encounter (Signed)
 Called the pt and there was no answer- LMTCB and will close encounter per protocol

## 2024-05-21 ENCOUNTER — Telehealth: Payer: Self-pay

## 2024-05-21 DIAGNOSIS — J9612 Chronic respiratory failure with hypercapnia: Secondary | ICD-10-CM | POA: Diagnosis not present

## 2024-05-21 DIAGNOSIS — J9611 Chronic respiratory failure with hypoxia: Secondary | ICD-10-CM | POA: Diagnosis not present

## 2024-05-21 DIAGNOSIS — M810 Age-related osteoporosis without current pathological fracture: Secondary | ICD-10-CM | POA: Diagnosis not present

## 2024-05-21 DIAGNOSIS — E46 Unspecified protein-calorie malnutrition: Secondary | ICD-10-CM | POA: Diagnosis not present

## 2024-05-21 DIAGNOSIS — I10 Essential (primary) hypertension: Secondary | ICD-10-CM | POA: Diagnosis not present

## 2024-05-21 DIAGNOSIS — J449 Chronic obstructive pulmonary disease, unspecified: Secondary | ICD-10-CM | POA: Diagnosis not present

## 2024-05-21 DIAGNOSIS — I2510 Atherosclerotic heart disease of native coronary artery without angina pectoris: Secondary | ICD-10-CM | POA: Diagnosis not present

## 2024-05-21 DIAGNOSIS — G629 Polyneuropathy, unspecified: Secondary | ICD-10-CM | POA: Diagnosis not present

## 2024-05-21 DIAGNOSIS — I2584 Coronary atherosclerosis due to calcified coronary lesion: Secondary | ICD-10-CM | POA: Diagnosis not present

## 2024-05-21 NOTE — Telephone Encounter (Signed)
 Copied from CRM [id number]. Topic: General - Call Back - No Documentation >> May 19, 2024  3:25 PM Margaret Henry wrote: Reason for CRM: Patient states returning call to leslie. Please return call to patient if needed.  Duplicate

## 2024-05-22 ENCOUNTER — Telehealth: Payer: Self-pay

## 2024-05-22 NOTE — Telephone Encounter (Signed)
 Left pt message to return call to find out information

## 2024-05-22 NOTE — Telephone Encounter (Signed)
 It looks like chronic azithromycin  was stopped and she was started on Ohtuvayre  at last clinic visit by Izetta Rouleau.  Can you please check with the patient if she has started on the new medication before restarting azithromycin .  CCing Katie Cobb for follow-up on this

## 2024-05-22 NOTE — Telephone Encounter (Signed)
 Copied from CRM [id number]. Topic: Clinical - Medical Advice >> May 20, 2024 10:49 AM Joesph PARAS wrote: Reason for CRM: Patient returning call to [facility] to state that she does need her azithromycin  (ZITHROMAX ) 250 MG tablet Refilled.  Spoke with patient regarding prior message. Patient stated she has stopped her  azithromycin  (ZITHROMAX ) 250 MG patient stated she has been on this medication for 3 years.Patient thought she would be ok to stop the medication but now patient is coughing up greenish tint   DR.Mannam can you please advise

## 2024-05-27 ENCOUNTER — Other Ambulatory Visit (HOSPITAL_BASED_OUTPATIENT_CLINIC_OR_DEPARTMENT_OTHER): Payer: Self-pay

## 2024-05-27 DIAGNOSIS — E46 Unspecified protein-calorie malnutrition: Secondary | ICD-10-CM | POA: Diagnosis not present

## 2024-05-27 DIAGNOSIS — J9611 Chronic respiratory failure with hypoxia: Secondary | ICD-10-CM | POA: Diagnosis not present

## 2024-05-27 DIAGNOSIS — J449 Chronic obstructive pulmonary disease, unspecified: Secondary | ICD-10-CM | POA: Diagnosis not present

## 2024-05-27 DIAGNOSIS — M810 Age-related osteoporosis without current pathological fracture: Secondary | ICD-10-CM | POA: Diagnosis not present

## 2024-05-27 DIAGNOSIS — J9612 Chronic respiratory failure with hypercapnia: Secondary | ICD-10-CM | POA: Diagnosis not present

## 2024-05-27 DIAGNOSIS — I2510 Atherosclerotic heart disease of native coronary artery without angina pectoris: Secondary | ICD-10-CM | POA: Diagnosis not present

## 2024-05-27 DIAGNOSIS — I2584 Coronary atherosclerosis due to calcified coronary lesion: Secondary | ICD-10-CM | POA: Diagnosis not present

## 2024-05-27 DIAGNOSIS — G629 Polyneuropathy, unspecified: Secondary | ICD-10-CM | POA: Diagnosis not present

## 2024-05-27 DIAGNOSIS — I10 Essential (primary) hypertension: Secondary | ICD-10-CM | POA: Diagnosis not present

## 2024-05-27 MED ORDER — AZITHROMYCIN 250 MG PO TABS
250.0000 mg | ORAL_TABLET | ORAL | 4 refills | Status: DC
  Filled 2024-05-27: qty 36, 84d supply, fill #0

## 2024-05-27 NOTE — Telephone Encounter (Signed)
 Called patient.  Patient states she is taking the Ohtuvayre  but it is causing a severe headache.  She does not want to take this medication any longer.  Patient states she is coughing up green mucus and is very SOB with any exertion.  Patient states she had a CT scan on 05/06/2024 and was told that it showed inflammation in her lungs.  Patient wants to resume the azithromycin .  She did well while taking this medication.  Please advise.  Pharmacy is [facility] in [place] on [address].  Dr. Theophilus, please advise.

## 2024-05-27 NOTE — Telephone Encounter (Signed)
 Called and spoke with patient, advised of recommendations per Dr. Theophilus.  She verbalized understanding.  Nothing further needed.

## 2024-05-27 NOTE — Telephone Encounter (Signed)
 Okay to go back on azithromycin  if she is not able to tolerate Ohtuvayre  We will try 3 times a week of azithromycin  to reduce side effects. New prescription has been sent to the pharmacy

## 2024-05-28 DIAGNOSIS — I10 Essential (primary) hypertension: Secondary | ICD-10-CM | POA: Diagnosis not present

## 2024-05-28 DIAGNOSIS — J449 Chronic obstructive pulmonary disease, unspecified: Secondary | ICD-10-CM | POA: Diagnosis not present

## 2024-05-28 DIAGNOSIS — J9612 Chronic respiratory failure with hypercapnia: Secondary | ICD-10-CM | POA: Diagnosis not present

## 2024-05-28 DIAGNOSIS — E46 Unspecified protein-calorie malnutrition: Secondary | ICD-10-CM | POA: Diagnosis not present

## 2024-05-28 DIAGNOSIS — I2510 Atherosclerotic heart disease of native coronary artery without angina pectoris: Secondary | ICD-10-CM | POA: Diagnosis not present

## 2024-05-28 DIAGNOSIS — I2584 Coronary atherosclerosis due to calcified coronary lesion: Secondary | ICD-10-CM | POA: Diagnosis not present

## 2024-05-28 DIAGNOSIS — M810 Age-related osteoporosis without current pathological fracture: Secondary | ICD-10-CM | POA: Diagnosis not present

## 2024-05-28 DIAGNOSIS — J9611 Chronic respiratory failure with hypoxia: Secondary | ICD-10-CM | POA: Diagnosis not present

## 2024-05-28 DIAGNOSIS — G629 Polyneuropathy, unspecified: Secondary | ICD-10-CM | POA: Diagnosis not present

## 2024-06-03 ENCOUNTER — Other Ambulatory Visit: Payer: Self-pay | Admitting: Pulmonary Disease

## 2024-06-03 DIAGNOSIS — E46 Unspecified protein-calorie malnutrition: Secondary | ICD-10-CM | POA: Diagnosis not present

## 2024-06-03 DIAGNOSIS — J449 Chronic obstructive pulmonary disease, unspecified: Secondary | ICD-10-CM | POA: Diagnosis not present

## 2024-06-03 DIAGNOSIS — J9612 Chronic respiratory failure with hypercapnia: Secondary | ICD-10-CM | POA: Diagnosis not present

## 2024-06-03 DIAGNOSIS — J9611 Chronic respiratory failure with hypoxia: Secondary | ICD-10-CM | POA: Diagnosis not present

## 2024-06-03 DIAGNOSIS — I2510 Atherosclerotic heart disease of native coronary artery without angina pectoris: Secondary | ICD-10-CM | POA: Diagnosis not present

## 2024-06-03 DIAGNOSIS — M810 Age-related osteoporosis without current pathological fracture: Secondary | ICD-10-CM | POA: Diagnosis not present

## 2024-06-03 DIAGNOSIS — I2584 Coronary atherosclerosis due to calcified coronary lesion: Secondary | ICD-10-CM | POA: Diagnosis not present

## 2024-06-03 DIAGNOSIS — I10 Essential (primary) hypertension: Secondary | ICD-10-CM | POA: Diagnosis not present

## 2024-06-03 DIAGNOSIS — G629 Polyneuropathy, unspecified: Secondary | ICD-10-CM | POA: Diagnosis not present

## 2024-06-10 ENCOUNTER — Other Ambulatory Visit (HOSPITAL_BASED_OUTPATIENT_CLINIC_OR_DEPARTMENT_OTHER): Payer: Self-pay | Admitting: Pharmacist

## 2024-06-10 DIAGNOSIS — Z79899 Other long term (current) drug therapy: Secondary | ICD-10-CM

## 2024-06-10 NOTE — Progress Notes (Signed)
 Pharmacy Quality Measure Review  This patient is appearing on a report for being at risk of failing the adherence measure for cholesterol (statin) medications this calendar year.   Medication: atorvastatin  Last fill date: 03/14/2024 for 90 day supply  Call placed to patient's pharmacy to authorize refill on her behalf. They are processing her request for shipment. Reminder set in 2 weeks to confirm that she got the medication.   Herlene Fleeta Morris, PharmD, JAQUELINE, CPP Clinical Pharmacist [facility] [phone]

## 2024-06-24 ENCOUNTER — Other Ambulatory Visit (HOSPITAL_BASED_OUTPATIENT_CLINIC_OR_DEPARTMENT_OTHER): Payer: Self-pay | Admitting: Pharmacist

## 2024-06-24 DIAGNOSIS — Z79899 Other long term (current) drug therapy: Secondary | ICD-10-CM

## 2024-06-24 NOTE — Progress Notes (Signed)
 Pharmacy Quality Measure Review  This patient is appearing on a report for being at risk of failing the adherence measure for cholesterol (statin) medications this calendar year.   Medication: atorvastatin  Last fill date: 06/11/2024 for 90 day supply  I called a couple of weeks back to authorize refills. Call placed to her pharmacy   Herlene Fleeta Morris, PharmD, BCACP, CPP Clinical Pharmacist [facility] [phone]

## 2024-07-08 ENCOUNTER — Other Ambulatory Visit (HOSPITAL_BASED_OUTPATIENT_CLINIC_OR_DEPARTMENT_OTHER): Payer: Self-pay

## 2024-07-08 ENCOUNTER — Ambulatory Visit (HOSPITAL_BASED_OUTPATIENT_CLINIC_OR_DEPARTMENT_OTHER)
Admission: RE | Admit: 2024-07-08 | Discharge: 2024-07-08 | Disposition: A | Discharge status: Home / Self Care | Source: Ambulatory Visit | Attending: Acute Care | Admitting: Acute Care

## 2024-07-08 DIAGNOSIS — J439 Emphysema, unspecified: Secondary | ICD-10-CM | POA: Diagnosis not present

## 2024-07-08 DIAGNOSIS — Z122 Encounter for screening for malignant neoplasm of respiratory organs: Secondary | ICD-10-CM | POA: Insufficient documentation

## 2024-07-08 DIAGNOSIS — I700 Atherosclerosis of aorta: Secondary | ICD-10-CM | POA: Insufficient documentation

## 2024-07-08 DIAGNOSIS — R918 Other nonspecific abnormal finding of lung field: Secondary | ICD-10-CM | POA: Diagnosis not present

## 2024-07-08 DIAGNOSIS — E041 Nontoxic single thyroid nodule: Secondary | ICD-10-CM | POA: Insufficient documentation

## 2024-07-08 DIAGNOSIS — Z87891 Personal history of nicotine dependence: Secondary | ICD-10-CM | POA: Diagnosis not present

## 2024-07-08 DIAGNOSIS — J432 Centrilobular emphysema: Secondary | ICD-10-CM | POA: Diagnosis not present

## 2024-07-08 DIAGNOSIS — I2510 Atherosclerotic heart disease of native coronary artery without angina pectoris: Secondary | ICD-10-CM | POA: Diagnosis not present

## 2024-07-08 MED ORDER — FLUZONE HIGH-DOSE 0.5 ML IM SUSY
0.5000 mL | PREFILLED_SYRINGE | Freq: Once | INTRAMUSCULAR | 0 refills | Status: AC
  Filled 2024-07-08: qty 0.5, 1d supply, fill #0

## 2024-07-14 ENCOUNTER — Telehealth: Payer: Self-pay | Admitting: Acute Care

## 2024-07-14 NOTE — Telephone Encounter (Signed)
 Called and spoke to patient. Patient states she doesn't feel she has had any worsening symptoms during the time of the scan or now. Patient states she has an ongoing cough with green mucus but has had this for 3 years. Patient is taking Azithromycin  three times a week. Patient states she had a fever about 2 weeks ago from today 07/14/24 (LDCT was on 07/08/2024) that spiked to 101 but only lasted about 4 hours then resolved and has not had a fever since. Patient denies any acute symptoms at this time. Will send to Lauraine to advise when next scan is due.

## 2024-07-14 NOTE — Telephone Encounter (Signed)
 Call report received:  *see phone note also 07/14/24  IMPRESSION: 1. Lung-RADS 0, incomplete. Additional lung cancer screening CT images/or comparison to prior chest CT examinations is needed. Persistent peribronchovascular nodularity and waxing and waning bilateral subpleural consolidation with interval increased subpleural irregular reticulations, for example most pronounced in the anterolateral right upper lobe. Findings are favored to represent an infectious/inflammatory process. 2. Partially imaged coarse calcified right thyroid  nodule measures 1.5 cm. Recommend thyroid  US  (ref: J Am Coll Radiol. 2015 Feb;12(2): 143-50). 3. Aortic Atherosclerosis (ICD10-I70.0) and Emphysema (ICD10-J43.9). Coronary artery calcifications. Assessment for potential risk factor modification, dietary therapy or pharmacologic therapy may be warranted, if clinically indicated.   These results will be called to the ordering clinician or representative by the Radiologist Assistant, and communication documented in the PACS or [employer].     Electronically Signed   By: Limin  Xu M.D.   On: 07/14/2024 14:03

## 2024-07-16 NOTE — Telephone Encounter (Signed)
 Called and spoke to patient. Patient last seen in clinic in 04/2024 with recs to return in 3-4 weeks and the patient's recent scan with associated symptoms pt has been scheduled with Landry Ferrari NP for 07/20/2024 acute visit. Will follow up after appt to review with Lauraine Lites NP.

## 2024-07-20 ENCOUNTER — Ambulatory Visit: Admitting: Primary Care

## 2024-07-20 ENCOUNTER — Encounter: Payer: Self-pay | Admitting: Primary Care

## 2024-07-20 ENCOUNTER — Other Ambulatory Visit: Payer: Self-pay | Admitting: Acute Care

## 2024-07-20 ENCOUNTER — Other Ambulatory Visit (HOSPITAL_BASED_OUTPATIENT_CLINIC_OR_DEPARTMENT_OTHER): Payer: Self-pay

## 2024-07-20 VITALS — BP 120/69 | HR 100 | Temp 97.6°F | Ht 64.0 in | Wt 100.2 lb

## 2024-07-20 DIAGNOSIS — J449 Chronic obstructive pulmonary disease, unspecified: Secondary | ICD-10-CM

## 2024-07-20 DIAGNOSIS — J9611 Chronic respiratory failure with hypoxia: Secondary | ICD-10-CM

## 2024-07-20 DIAGNOSIS — R918 Other nonspecific abnormal finding of lung field: Secondary | ICD-10-CM

## 2024-07-20 MED ORDER — AZITHROMYCIN 250 MG PO TABS
250.0000 mg | ORAL_TABLET | Freq: Every day | ORAL | 1 refills | Status: DC
  Filled 2024-07-20 – 2024-08-18 (×2): qty 30, 30d supply, fill #0

## 2024-07-20 NOTE — Progress Notes (Signed)
 @Patient  ID: Merlynn GORMAN Burke, female    DOB: 05/14/1951, 73 y.o.   MRN: [id number]  Chief Complaint  Patient presents with   Medical Management of Chronic Issues    CT F/U    Referring provider: Delbert Clam, MD  HPI: 73 year old female, former smoker. PMH significant for  CAD, mild aortic insufficiency, COPD, allergic rhinitis, lung nodule, GERD, goiter, osteoporosis, OAB, hyperlipidemia, iron deficiency anemia, vit D deficiency, covid-19.   07/20/2024 Discussed the use of AI scribe software for clinical note transcription with the patient, who gave verbal consent to proceed.  History of Present Illness HELEN WINTERHALTER is a 73 year old female with chronic respiratory issues who presents for follow-up after recent CT scans.  She underwent CT scans on October 1st, 2025, which revealed lung nodules with waxing and waning consolidation in the right upper lobe. A repeat CT scan is scheduled in two months for comparison.  She has experienced a chronic cough for three years, producing green mucus, occurring four to five times a week. She does not take medication specifically for the cough. She uses Breo and Spiriva  inhalers daily and has tried a nebulizer with Ohtuvayre , which she feels has not made a difference. She experiences shortness of breath and uses oxygen  at three liters continuously.  She recently completed a four-week course of physical therapy aimed at improving mobility, which included walking and exercises. She uses a walker at home as needed but can walk around the house without it most of the time. She experiences significant shortness of breath with minimal exertion, such as walking from the bathroom to the couch, causing her oxygen  levels to drop to 86%.  She has a history of osteoporosis, affecting her treatment options, particularly with medications like prednisone . She has been on azithromycin  three times a week and previously took it daily, which she felt was more  effective. She has tried hypertonic saline nebulizer and a flutter valve for mucus clearance but did not find them helpful. She has not had a breathing test since July 2017.  A calcified right thyroid  nodule was noted on the CT scan, previously biopsied in 2010 and found to be clear.   Allergies  Allergen Reactions   Hydrocodone  Nausea And Vomiting   Propoxyphene N-Acetaminophen  Nausea And Vomiting   Augmentin  [Amoxicillin -Pot Clavulanate] Rash    Immunization History  Administered Date(s) Administered   Fluad  Quad(high Dose 65+) 07/20/2019, 07/12/2021, 07/04/2022   Fluad  Trivalent(High Dose 65+) 06/20/2023   INFLUENZA, HIGH DOSE SEASONAL PF 07/08/2024   Influenza Split 07/07/2013, 07/05/2016   Influenza Whole 11/09/2009   Influenza,inj,Quad PF,6+ Mos 06/24/2015, 07/03/2017, 06/19/2018   PNEUMOCOCCAL CONJUGATE-20 06/20/2023   Pneumococcal Conjugate-13 09/26/2015   Pneumococcal Polysaccharide-23 10/08/1998, 07/08/2018   Respiratory Syncytial Virus Vaccine ,Recomb Aduvanted(Arexvy ) 07/05/2022   Td 10/08/2002   Td (Adult), 2 Lf Tetanus Toxid, Preservative Free 10/08/2002   Tdap 09/10/2016    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)   Anal intraepithelial neoplasia II (AIN II) 2017   Anemia 04/06/22   Aortic atherosclerosis    B12 deficiency    Blood transfusion without reported diagnosis 6/23   Bronchitis    Cancer (HCC)    skin cancer on chest   Cataract 5/23   Chest pain 06/12/2016   Cholelithiasis    Colon polyps    Complication of anesthesia 05/16/2021   pt states  was given too much during nasal surgery 1989; difficulty getting awake   COPD (chronic obstructive pulmonary disease) (HCC)    Coronary artery calcification    Diverticulitis 05/16/2021   occasional   Diverticulosis    Dyspnea 06/27/2016   Emphysema of lung (HCC)    External hemorrhoids    GERD  (gastroesophageal reflux disease)    occasional   Hemorrhoids    High cholesterol    Hyperlipidemia LDL goal <70    Iron deficiency    Mild aortic insufficiency    Neuropathy 04/07/2022   Osteoporosis    Oxygen  deficiency    Pneumonia    Shortness of breath dyspnea    with exertion    Thyroid  goiter    bx benign   Tobacco abuse    Vertigo 04/21/2017    Tobacco History: Social History   Tobacco Use  Smoking Status Former   Current packs/day: 0.00   Average packs/day: 1.5 packs/day for 50.0 years (75.0 ttl pk-yrs)   Types: Cigarettes   Start date: 11/03/1968   Quit date: 11/03/2018   Years since quitting: 5.7  Smokeless Tobacco Never   Counseling given: Not Answered   Outpatient Medications Prior to Visit  Medication Sig Dispense Refill   albuterol  (PROVENTIL ) (2.5 MG/3ML) 0.083% nebulizer solution Take 2.5 mg by nebulization every 6 (six) hours as needed for wheezing or shortness of breath.     atorvastatin  (LIPITOR) 40 MG tablet Take 1 tablet (40 mg total) by mouth daily. 90 tablet 3   azithromycin  (ZITHROMAX ) 250 MG tablet Take 1 tablet (250 mg total) by mouth 3 (three) times a week. 36 tablet 4   bismuth subsalicylate (PEPTO BISMOL) 262 MG/15ML suspension Take 30 mLs by mouth every 6 (six) hours as needed for indigestion.     BREO ELLIPTA  200-25 MCG/ACT AEPB INHALE 1 PUFF EVERY DAY (Patient taking differently: Inhale 1 puff into the lungs daily.) 180 each 3   Ensifentrine  (OHTUVAYRE ) 3 MG/2.5ML SUSP Inhale 2.5 mLs into the lungs 2 (two) times daily.     ferrous sulfate  325 (65 FE) MG EC tablet Take 325 mg by mouth every other day. Pt states she is only taking once a week due to constipation     fluticasone  (FLONASE ) 50 MCG/ACT nasal spray Place 2 sprays into both nostrils daily. 16 g 6   loratadine  (CLARITIN ) 10 MG tablet Take 1 tablet (10 mg total) by mouth daily. 100 tablet 0   nitroGLYCERIN  (NITROSTAT ) 0.4 MG SL tablet Place 1 tablet (0.4 mg total) under the tongue  every 5 (five) minutes as needed for chest pain. 25 tablet 6   OXYGEN  Inhale 3 L/min into the lungs continuous.     SPIRIVA  RESPIMAT 2.5 MCG/ACT AERS INHALE 2 PUFFS EVERY DAY 12 g 3   vitamin B-12 (CYANOCOBALAMIN ) 100 MCG tablet Take 100 mcg by mouth daily.     No facility-administered medications prior to visit.      Review of Systems  Review of Systems  Constitutional: Negative.   Respiratory:  Positive for cough.    Physical Exam  BP 120/69   Pulse 100   Temp 97.6 F (36.4 C)   Ht 5' 4 (1.626 m)   Wt 100 lb 3.2 oz (45.5 kg)   SpO2 94% Comment: 3L O2 CONT  BMI 17.20 kg/m  Physical Exam Constitutional:      Appearance: Normal appearance.  HENT:     Head: Normocephalic and atraumatic.  Cardiovascular:     Rate and Rhythm:  Normal rate and regular rhythm.  Pulmonary:     Effort: Pulmonary effort is normal.     Breath sounds: Normal breath sounds. No wheezing, rhonchi or rales.  Musculoskeletal:        General: Normal range of motion.  Skin:    General: Skin is warm and dry.  Neurological:     General: No focal deficit present.     Mental Status: She is alert and oriented to person, place, and time. Mental status is at baseline.  Psychiatric:        Mood and Affect: Mood normal.        Behavior: Behavior normal.        Thought Content: Thought content normal.        Judgment: Judgment normal.      Lab Results:  CBC    Component Value Date/Time   WBC 8.9 02/06/2024 1202   WBC 11.1 (H) 05/23/2023 1216   RBC 3.96 02/06/2024 1202   RBC 3.85 (L) 05/23/2023 1216   HGB 12.2 02/06/2024 1202   HCT 38.0 02/06/2024 1202   PLT 299 02/06/2024 1202   MCV 96 02/06/2024 1202   MCH 30.8 02/06/2024 1202   MCH 29.4 05/23/2023 1216   MCHC 32.1 02/06/2024 1202   MCHC 30.9 05/23/2023 1216   RDW 13.1 02/06/2024 1202   LYMPHSABS 1.3 02/06/2024 1202   MONOABS 0.9 04/22/2023 1135   EOSABS 0.3 02/06/2024 1202   BASOSABS 0.1 02/06/2024 1202    BMET    Component Value  Date/Time   NA 142 02/06/2024 1202   K 4.4 02/06/2024 1202   CL 103 02/06/2024 1202   CO2 25 02/06/2024 1202   GLUCOSE 88 02/06/2024 1202   GLUCOSE 89 05/29/2023 1128   BUN 9 02/06/2024 1202   CREATININE 0.51 (L) 02/06/2024 1202   CREATININE 0.60 09/05/2016 1218   CALCIUM  9.5 02/06/2024 1202   GFRNONAA >60 05/29/2023 1128   GFRNONAA >89 03/26/2016 1022   GFRAA 109 11/18/2020 1201   GFRAA >89 03/26/2016 1022    BNP    Component Value Date/Time   BNP 61.4 05/21/2023 1616    ProBNP No results found for: PROBNP  Imaging: CT CHEST LCS NODULE F/U LOW DOSE WO CONTRAST Result Date: 07/14/2024 CLINICAL DATA:  Follow-up Lung-RADS 0 EXAM: CT CHEST WITHOUT CONTRAST FOR LUNG CANCER SCREENING NODULE FOLLOW-UP TECHNIQUE: Multidetector CT imaging of the chest was performed following the standard protocol without IV contrast. RADIATION DOSE REDUCTION: This exam was performed according to the departmental dose-optimization program which includes automated exposure control, adjustment of the mA and/or kV according to patient size and/or use of iterative reconstruction technique. COMPARISON:  CT chest dated 05/06/2024 FINDINGS: Cardiovascular: Normal heart size. No significant pericardial fluid/thickening. Great vessels are normal in course and caliber. Coronary artery calcifications and aortic atherosclerosis. Mediastinum/Nodes: Partially imaged coarse calcified right thyroid  nodule measures 1.5 cm. Normal esophagus. No pathologically enlarged axillary, supraclavicular, mediastinal, or hilar lymph nodes. Lungs/Pleura: The central airways are patent. Severe centrilobular emphysema. Persistent peribronchovascular nodularity and waxing and waning bilateral subpleural consolidation with interval increased subpleural irregular reticulations, for example most pronounced in the anterolateral right upper lobe. No pneumothorax. No pleural effusion. Upper abdomen: Cholelithiasis. Partially imaged right extrarenal  pelvis. Scattered calcified splenic granulomata. Musculoskeletal: No acute or abnormal lytic or blastic osseous lesions. Multilevel degenerative changes of the thoracic spine. Unchanged compression fractures of T8, T9, and T11-L1. IMPRESSION: 1. Lung-RADS 0, incomplete. Additional lung cancer screening CT images/or comparison to prior chest CT  examinations is needed. Persistent peribronchovascular nodularity and waxing and waning bilateral subpleural consolidation with interval increased subpleural irregular reticulations, for example most pronounced in the anterolateral right upper lobe. Findings are favored to represent an infectious/inflammatory process. 2. Partially imaged coarse calcified right thyroid  nodule measures 1.5 cm. Recommend thyroid  US  (ref: J Am Coll Radiol. 2015 Feb;12(2): 143-50). 3. Aortic Atherosclerosis (ICD10-I70.0) and Emphysema (ICD10-J43.9). Coronary artery calcifications. Assessment for potential risk factor modification, dietary therapy or pharmacologic therapy may be warranted, if clinically indicated. These results will be called to the ordering clinician or representative by the Radiologist Assistant, and communication documented in the PACS or [employer]. Electronically Signed   By: Limin  Xu M.D.   On: 07/14/2024 14:03     Assessment & Plan:   1. Chronic obstructive pulmonary disease, unspecified COPD type (HCC) (Primary)  2. Chronic respiratory failure with hypoxia (HCC)  Assessment and Plan Assessment & Plan Chronic obstructive pulmonary disease with chronic respiratory failure  Persistent cough with green sputum for three years, not associated with acute bronchitis or pneumonia. Shortness of breath with oxygen  saturation dropping to 86% upon exertion. Current treatment includes Breo, Spiriva , and Ohtuvayre  nebulizer, with limited improvement. Albuterol  nebulizer previously provided more relief. Mucinex  not tolerated due to choking. Prednisone  not preferred due  to osteoporosis. Azithromycin  three times a week, previously daily, with better outcomes when taken daily. No acute infection on examination. Sputum culture desired but not feasible. Bronchoscopy considered high risk due to chronic respiratory failure. - Resume albuterol  nebulizer two to three times a day. - Discontinue Ohtuvayre  nebulizer due lack benefit and cost - Increase oxygen  to four liters with exertion if needed. - Use Robitussin as needed for cough. - Use flutter valve and hypertonic saline nebulizer if congestion is present. - Switch Azithromycin  250mcg to daily dosing - Perform EKG at follow-up to monitor for cardiac abnormalities. - Provide sputum cup for potential sample collection.  Right upper lobe pulmonary nodularity and consolidation (inflammatory/infectious) Right upper lobe pulmonary nodularity and waxing and waning consolidation, likely representing an inflammatory or infectious process rather than malignancy. No acute findings on recent CT scan. Repeat CT scan planned for comparison. - Order repeat CT scan in two months. - Consider bronchoscopy if CT scan remains inconclusive and sputum sample is not obtainable.  Recording duration: 19 minutes  Almarie LELON Ferrari, NP 07/20/2024

## 2024-07-20 NOTE — Patient Instructions (Addendum)
  VISIT SUMMARY: During your visit, we reviewed your recent CT scans and discussed your ongoing respiratory issues, including your chronic cough and shortness of breath. We also talked about your current treatments and made some adjustments to help manage your symptoms better.  YOUR PLAN: -CHRONIC OBSTRUCTIVE PULMONARY DISEASE WITH CHRONIC RESPIRATORY FAILURE AND CHRONIC PRODUCTIVE COUGH: This condition involves long-term breathing problems and poor airflow, often accompanied by a persistent cough producing mucus. Continue Breo and Spiriva  inhalers as directed. We will resume your albuterol  nebulizer two to three times a day and discontinue the Ohtuvayre  nebulizer. Increase your oxygen  to four liters during exertion if needed, and use Robitussin as needed for your cough. Continue using the flutter valve and hypertonic saline nebulizer if you have congestion. We will switch your erythromycin to daily dosing. An EKG will be performed at your follow-up to monitor for any heart issues, and you will be provided with a sputum cup for a potential sample collection.  -RIGHT UPPER LOBE PULMONARY NODULARITY AND CONSOLIDATION (INFLAMMATORY/INFECTIOUS): This refers to small abnormal areas in your lung that may be due to inflammation or infection. We will order a repeat CT scan in two months to monitor these areas. If the CT scan remains inconclusive and we cannot obtain a sputum sample, we may consider a bronchoscopy.  INSTRUCTIONS: Please follow up for an EKG and repeat CT scan in two months. Use the sputum cup provided for a potential sample collection.  Orders: Low dose CT chest in 2 months Sputum culture if able  Follow-up 3 months with Sarah Groce with EKG

## 2024-07-20 NOTE — Telephone Encounter (Signed)
 Per Lauraine Lites: Pt will need an 8 week follow up CT. Lauraine has messaged Landry Ferrari, NP who is seeing pt in office today.

## 2024-07-29 ENCOUNTER — Encounter: Payer: Self-pay | Admitting: Family Medicine

## 2024-07-30 ENCOUNTER — Other Ambulatory Visit: Payer: Self-pay | Admitting: Family Medicine

## 2024-07-30 DIAGNOSIS — E041 Nontoxic single thyroid nodule: Secondary | ICD-10-CM

## 2024-07-31 ENCOUNTER — Encounter: Payer: Self-pay | Admitting: Acute Care

## 2024-08-02 ENCOUNTER — Ambulatory Visit (HOSPITAL_BASED_OUTPATIENT_CLINIC_OR_DEPARTMENT_OTHER)
Admission: RE | Admit: 2024-08-02 | Discharge: 2024-08-02 | Disposition: A | Discharge status: Home / Self Care | Source: Ambulatory Visit | Attending: Family Medicine | Admitting: Family Medicine

## 2024-08-02 DIAGNOSIS — E041 Nontoxic single thyroid nodule: Secondary | ICD-10-CM | POA: Insufficient documentation

## 2024-08-02 DIAGNOSIS — E042 Nontoxic multinodular goiter: Secondary | ICD-10-CM | POA: Diagnosis not present

## 2024-08-03 ENCOUNTER — Ambulatory Visit: Payer: Self-pay | Admitting: Family Medicine

## 2024-08-10 ENCOUNTER — Encounter: Payer: Self-pay | Admitting: Radiology

## 2024-08-10 ENCOUNTER — Ambulatory Visit: Admitting: Family Medicine

## 2024-08-11 DIAGNOSIS — E46 Unspecified protein-calorie malnutrition: Secondary | ICD-10-CM | POA: Diagnosis not present

## 2024-08-11 DIAGNOSIS — J302 Other seasonal allergic rhinitis: Secondary | ICD-10-CM | POA: Diagnosis not present

## 2024-08-11 DIAGNOSIS — Z833 Family history of diabetes mellitus: Secondary | ICD-10-CM | POA: Diagnosis not present

## 2024-08-11 DIAGNOSIS — R32 Unspecified urinary incontinence: Secondary | ICD-10-CM | POA: Diagnosis not present

## 2024-08-11 DIAGNOSIS — J4489 Other specified chronic obstructive pulmonary disease: Secondary | ICD-10-CM | POA: Diagnosis not present

## 2024-08-11 DIAGNOSIS — E785 Hyperlipidemia, unspecified: Secondary | ICD-10-CM | POA: Diagnosis not present

## 2024-08-11 DIAGNOSIS — M810 Age-related osteoporosis without current pathological fracture: Secondary | ICD-10-CM | POA: Diagnosis not present

## 2024-08-11 DIAGNOSIS — K219 Gastro-esophageal reflux disease without esophagitis: Secondary | ICD-10-CM | POA: Diagnosis not present

## 2024-08-11 DIAGNOSIS — Z8701 Personal history of pneumonia (recurrent): Secondary | ICD-10-CM | POA: Diagnosis not present

## 2024-08-11 DIAGNOSIS — R519 Headache, unspecified: Secondary | ICD-10-CM | POA: Diagnosis not present

## 2024-08-11 DIAGNOSIS — J9610 Chronic respiratory failure, unspecified whether with hypoxia or hypercapnia: Secondary | ICD-10-CM | POA: Diagnosis not present

## 2024-08-11 DIAGNOSIS — Z792 Long term (current) use of antibiotics: Secondary | ICD-10-CM | POA: Diagnosis not present

## 2024-08-11 DIAGNOSIS — Z9981 Dependence on supplemental oxygen: Secondary | ICD-10-CM | POA: Diagnosis not present

## 2024-08-11 DIAGNOSIS — Z681 Body mass index (BMI) 19 or less, adult: Secondary | ICD-10-CM | POA: Diagnosis not present

## 2024-08-11 DIAGNOSIS — Z8249 Family history of ischemic heart disease and other diseases of the circulatory system: Secondary | ICD-10-CM | POA: Diagnosis not present

## 2024-08-18 ENCOUNTER — Encounter: Payer: Self-pay | Admitting: Family Medicine

## 2024-08-18 ENCOUNTER — Ambulatory Visit: Attending: Family Medicine | Admitting: Family Medicine

## 2024-08-18 ENCOUNTER — Ambulatory Visit: Admitting: Family Medicine

## 2024-08-18 ENCOUNTER — Other Ambulatory Visit (HOSPITAL_BASED_OUTPATIENT_CLINIC_OR_DEPARTMENT_OTHER): Payer: Self-pay

## 2024-08-18 VITALS — BP 145/74 | HR 95 | Temp 98.0°F | Ht 64.0 in | Wt 90.8 lb

## 2024-08-18 DIAGNOSIS — J9612 Chronic respiratory failure with hypercapnia: Secondary | ICD-10-CM | POA: Diagnosis not present

## 2024-08-18 DIAGNOSIS — E041 Nontoxic single thyroid nodule: Secondary | ICD-10-CM

## 2024-08-18 DIAGNOSIS — R634 Abnormal weight loss: Secondary | ICD-10-CM

## 2024-08-18 DIAGNOSIS — J449 Chronic obstructive pulmonary disease, unspecified: Secondary | ICD-10-CM

## 2024-08-18 DIAGNOSIS — I10 Essential (primary) hypertension: Secondary | ICD-10-CM | POA: Diagnosis not present

## 2024-08-18 DIAGNOSIS — J9611 Chronic respiratory failure with hypoxia: Secondary | ICD-10-CM

## 2024-08-18 DIAGNOSIS — J3089 Other allergic rhinitis: Secondary | ICD-10-CM | POA: Diagnosis not present

## 2024-08-18 DIAGNOSIS — J302 Other seasonal allergic rhinitis: Secondary | ICD-10-CM

## 2024-08-18 MED ORDER — AMLODIPINE BESYLATE 2.5 MG PO TABS
2.5000 mg | ORAL_TABLET | Freq: Every day | ORAL | 1 refills | Status: AC
Start: 2024-08-18 — End: ?
  Filled 2024-08-18: qty 90, 90d supply, fill #0

## 2024-08-18 MED ORDER — LORATADINE 10 MG PO TABS
10.0000 mg | ORAL_TABLET | Freq: Every day | ORAL | 3 refills | Status: AC
  Filled 2024-08-18: qty 100, 100d supply, fill #0

## 2024-08-18 MED ORDER — FLUTICASONE PROPIONATE 50 MCG/ACT NA SUSP
2.0000 | Freq: Every day | NASAL | 6 refills | Status: AC
  Filled 2024-08-18: qty 16, 30d supply, fill #0

## 2024-08-18 NOTE — Patient Instructions (Signed)
 VISIT SUMMARY:  Today, we addressed your concerns about elevated blood pressure and weight loss. We reviewed your current medications and discussed your recent health changes. We also planned further tests to investigate the cause of your weight loss and adjusted your treatment plan for blood pressure management.  YOUR PLAN:  -ESSENTIAL HYPERTENSION: Essential hypertension means high blood pressure without a known secondary cause. Your blood pressure has been elevated at home and in the office. We have prescribed amlodipine  2.5 mg daily to help control your blood pressure. Please monitor your blood pressure at home and report any dizziness or lightheadedness. We will follow up in 3 months to see how you are responding to the medication.  -ABNORMAL WEIGHT LOSS: Abnormal weight loss means losing weight without trying, which can be a sign of an underlying health issue. You have lost weight from 100 to 99 pounds over the past two weeks without changes in your appetite or diet. We have ordered a CT scan of your abdomen and pelvis to check for any masses and blood tests to look for markers of pancreatic cancer. Please continue to monitor your weight and appetite.  -CHRONIC OBSTRUCTIVE PULMONARY DISEASE AND CHRONIC RESPIRATORY FAILURE WITH HYPOXIA: Chronic obstructive pulmonary disease (COPD) is a lung condition that makes it hard to breathe, and chronic respiratory failure with hypoxia means your body is not getting enough oxygen . You should continue using your Breo Ellipta  and Spiriva  Respimat inhalers, and maintain your oxygen  therapy at 3 liters per minute.  -ALLERGIC RHINITIS: Allergic rhinitis is an allergic reaction that causes sneezing, congestion, and a runny nose. Continue taking loratadine  and using Flonase  as prescribed. We have refilled your prescriptions for these medications.  INSTRUCTIONS:  Please follow up in 3 months to assess your response to the blood pressure medication. Continue to  monitor your blood pressure at home and report any dizziness or lightheadedness. Additionally, monitor your weight and appetite, and complete the CT scan and blood tests as ordered.

## 2024-08-18 NOTE — Progress Notes (Signed)
 Subjective:  Patient ID: Margaret Henry, female    DOB: [birth year]  Age: 73 y.o. MRN: [id number]  CC: Medical Management of Chronic Issues (Elevated BP for 2 weeks/Discuss thyroid  Ultasound results)     Discussed the use of AI scribe software for clinical note transcription with the patient, who gave verbal consent to proceed.  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  She has experienced elevated blood pressure at home for the past two and a half weeks, with readings around 160/119 mmHg. Her blood pressure was previously well-controlled during a visit to the pulmonologist last month. She has reduced her salt intake, using a salt substitute.  She is concerned about weight loss, noting a decrease from 100 pounds two weeks ago to 90 pounds yesterday. She denies a decrease in appetite and eats substantially when she does eat, although not consuming three meals a day. She experiences occasional constipation that resolves after two to three days.  A thyroid  nodule was stable on a recent ultrasound, with no new abnormalities since a biopsy in 2010. A goiter was identified in 2000 and evaluated with a nuclear scan.  She continues to use her medications, including Breo, an inhaler, and azitthromycin. She is on three liters of oxygen  at home and uses loratadine  and Flonase  for allergies. Her cholesterol levels were good in May, and she continues to take her cholesterol medication.    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)   Anal intraepithelial neoplasia II (AIN II) 2017   Anemia 04/06/22   Aortic atherosclerosis     B12 deficiency    Blood transfusion without reported diagnosis 6/23   Bronchitis    Cancer (HCC)    skin cancer on chest   Cataract 5/23   Chest pain 06/12/2016   Cholelithiasis    Colon polyps    Complication of anesthesia 05/16/2021   pt states was given too much during nasal surgery 1989; difficulty getting awake   COPD (chronic obstructive pulmonary disease) (HCC)    Coronary artery calcification    Diverticulitis 05/16/2021   occasional   Diverticulosis    Dyspnea 06/27/2016   Emphysema of lung (HCC)    External hemorrhoids    GERD (gastroesophageal reflux disease)    occasional   Hemorrhoids    High cholesterol    Hyperlipidemia LDL goal <70    Iron deficiency    Mild aortic insufficiency    Neuropathy 04/07/2022   Osteoporosis    Oxygen  deficiency    Pneumonia    Shortness of breath dyspnea    with exertion    Thyroid  goiter    bx benign   Tobacco abuse    Vertigo 04/21/2017    Past Surgical History:  Procedure Laterality Date   ABDOMINAL HYSTERECTOMY     APPENDECTOMY  1973   BILATERAL OOPHORECTOMY  10/09/1999   for benign ovarian mass    BIOPSY THYROID      DENTAL SURGERY     dentures   NASAL SEPTUM SURGERY     PARTIAL HYSTERECTOMY  10/08/1974   for heavy menses    RECTAL EXAM  UNDER ANESTHESIA N/A 12/14/2015   Procedure: RECTAL EXAM UNDER ANESTHESIA REMOVAL OF ANAL CANAL MASS; INTERNAL HEMORRHOID LIGATION, EXTERNAL HEMORRHOID LIGATION;  Surgeon: Elspeth Schultze, MD;  Location: [facility];  Service: General;  Laterality: N/A;   TUBAL LIGATION     WISDOM TOOTH EXTRACTION      Family History  Problem Relation Age of Onset   Heart disease Mother    Alcohol abuse Mother    Asthma Mother    COPD Father    Heart disease Father    Lung cancer Maternal Grandfather    Cancer Maternal Grandfather    Liver cancer Paternal Grandmother    Cancer Paternal Grandmother    Diabetes Paternal Aunt    Colon cancer Neg Hx    Colon polyps Neg Hx    Esophageal cancer Neg  Hx    Rectal cancer Neg Hx    Stomach cancer Neg Hx     Social History   Socioeconomic History   Marital status: Widowed    Spouse name: Not on file   Number of children: Not on file   Years of education: Not on file   Highest education level: 9th grade  Occupational History   Not on file  Tobacco Use   Smoking status: Former    Current packs/day: 0.00    Average packs/day: 1.5 packs/day for 50.0 years (75.0 ttl pk-yrs)    Types: Cigarettes    Start date: 11/03/1968    Quit date: 11/03/2018    Years since quitting: 5.7   Smokeless tobacco: Never  Vaping Use   Vaping status: Never Used  Substance and Sexual Activity   Alcohol use: No   Drug use: No   Sexual activity: Never  Other Topics Concern   Not on file  Social History Narrative   Not on file   Social Drivers of Health   Financial Resource Strain: Low Risk  (08/15/2024)   Overall Financial Resource Strain (CARDIA)    Difficulty of Paying Living Expenses: Not hard at all  Food Insecurity: Unknown (08/15/2024)   Hunger Vital Sign    Worried About Running Out of Food in the Last Year: Not on file    Ran Out of Food in the Last Year: Never true  Transportation Needs: No Transportation Needs (08/15/2024)   PRAPARE - Administrator, Civil Service (Medical): No    Lack of Transportation (Non-Medical): No  Physical Activity: Insufficiently Active (08/15/2024)   Exercise Vital Sign    Days of Exercise per Week: 3 days    Minutes of Exercise per Session: 10 min  Stress: No Stress Concern Present (08/15/2024)   [employer] of Occupational Health - Occupational Stress Questionnaire    Feeling of Stress: Not at all  Social Connections: Socially Isolated (08/15/2024)   Social Connection and Isolation Panel    Frequency of Communication with Friends and Family: More than three times a week    Frequency of Social Gatherings with Friends and Family: Once a week    Attends Religious Services: Never    Automotive Engineer or Organizations: No    Attends Engineer, Structural: Not on file    Marital Status: Widowed    Allergies  Allergen Reactions   Hydrocodone  Nausea And Vomiting   Propoxyphene N-Acetaminophen  Nausea And Vomiting   Augmentin  [Amoxicillin -Pot Clavulanate] Rash    Outpatient Medications Prior to Visit  Medication Sig Dispense Refill   albuterol  (PROVENTIL ) (2.5 MG/3ML) 0.083% nebulizer solution Take 2.5  mg by nebulization every 6 (six) hours as needed for wheezing or shortness of breath.     atorvastatin  (LIPITOR) 40 MG tablet Take 1 tablet (40 mg total) by mouth daily. 90 tablet 3   azithromycin  (ZITHROMAX ) 250 MG tablet Take 1 tablet (250 mg total) by mouth daily. 30 tablet 1   bismuth subsalicylate (PEPTO BISMOL) 262 MG/15ML suspension Take 30 mLs by mouth every 6 (six) hours as needed for indigestion.     BREO ELLIPTA  200-25 MCG/ACT AEPB INHALE 1 PUFF EVERY DAY (Patient taking differently: Inhale 1 puff into the lungs daily.) 180 each 3   ferrous sulfate  325 (65 FE) MG EC tablet Take 325 mg by mouth every other day. Pt states she is only taking once a week due to constipation     nitroGLYCERIN  (NITROSTAT ) 0.4 MG SL tablet Place 1 tablet (0.4 mg total) under the tongue every 5 (five) minutes as needed for chest pain. 25 tablet 6   OXYGEN  Inhale 3 L/min into the lungs continuous.     SPIRIVA  RESPIMAT 2.5 MCG/ACT AERS INHALE 2 PUFFS EVERY DAY 12 g 3   fluticasone  (FLONASE ) 50 MCG/ACT nasal spray Place 2 sprays into both nostrils daily. 16 g 6   loratadine  (CLARITIN ) 10 MG tablet Take 1 tablet (10 mg total) by mouth daily. 100 tablet 0   Ensifentrine  (OHTUVAYRE ) 3 MG/2.5ML SUSP Inhale 2.5 mLs into the lungs 2 (two) times daily.     vitamin B-12 (CYANOCOBALAMIN ) 100 MCG tablet Take 100 mcg by mouth daily.     No facility-administered medications prior to visit.     ROS Review of Systems  Constitutional:  Positive for unexpected weight change. Negative for  activity change and appetite change.  HENT:  Negative for sinus pressure and sore throat.   Respiratory:  Negative for chest tightness, shortness of breath and wheezing.   Cardiovascular:  Negative for chest pain and palpitations.  Gastrointestinal:  Negative for abdominal distention, abdominal pain and constipation.  Genitourinary: Negative.   Musculoskeletal: Negative.   Psychiatric/Behavioral:  Negative for behavioral problems and dysphoric mood.     Objective:  BP (!) 145/74   Pulse 95   Temp 98 F (36.7 C) (Oral)   Ht 5' 4 (1.626 m)   Wt 90 lb 12.8 oz (41.2 kg)   SpO2 95%   BMI 15.59 kg/m      08/18/2024   10:17 AM 08/18/2024    9:49 AM 07/20/2024    3:44 PM  BP/Weight  Systolic BP 145 183 120  Diastolic BP 74 73 69  Wt. (Lbs)  90.8 100.2  BMI  15.59 kg/m2 17.2 kg/m2      Physical Exam Constitutional:      Appearance: She is well-developed.  HENT:     Nose:     Comments: On 3L via nasal cannula Cardiovascular:     Rate and Rhythm: Normal rate.     Heart sounds: Normal heart sounds. No murmur heard. Pulmonary:     Effort: Pulmonary effort is normal.     Breath sounds: Normal breath sounds. No wheezing or rales.  Chest:     Chest wall: No tenderness.  Abdominal:     General: Bowel sounds are normal. There is no distension.     Palpations: Abdomen is soft. There is no mass.     Tenderness: There is no abdominal tenderness.  Musculoskeletal:        General: Normal range of motion.     Right lower leg:  No edema.     Left lower leg: No edema.  Neurological:     Mental Status: She is alert and oriented to person, place, and time.  Psychiatric:        Mood and Affect: Mood normal.        Latest Ref Rng & Units 02/06/2024   12:02 PM 05/29/2023   11:28 AM 05/22/2023    4:24 AM  CMP  Glucose 70 - 99 mg/dL 88  89  896   BUN 8 - 27 mg/dL 9  11  7    Creatinine 0.57 - 1.00 mg/dL 9.48  9.52  9.55   Sodium 134 - 144 mmol/L 142  140  139   Potassium 3.5 - 5.2  mmol/L 4.4  3.8  3.9   Chloride 96 - 106 mmol/L 103  103  107   CO2 20 - 29 mmol/L 25  29  24    Calcium  8.7 - 10.3 mg/dL 9.5  8.8  8.9   Total Protein 6.0 - 8.5 g/dL 7.1   6.4   Total Bilirubin 0.0 - 1.2 mg/dL 0.3   0.6   Alkaline Phos 44 - 121 IU/L 90   56   AST 0 - 40 IU/L 16   17   ALT 0 - 32 IU/L 8   10     Lipid Panel     Component Value Date/Time   CHOL 149 02/06/2024 1202   TRIG 133 02/06/2024 1202   HDL 58 02/06/2024 1202   CHOLHDL 2 01/30/2022 1158   VLDL 26.4 01/30/2022 1158   LDLCALC 68 02/06/2024 1202   LDLDIRECT 166 (H) 09/26/2015 1001    CBC    Component Value Date/Time   WBC 8.9 02/06/2024 1202   WBC 11.1 (H) 05/23/2023 1216   RBC 3.96 02/06/2024 1202   RBC 3.85 (L) 05/23/2023 1216   HGB 12.2 02/06/2024 1202   HCT 38.0 02/06/2024 1202   PLT 299 02/06/2024 1202   MCV 96 02/06/2024 1202   MCH 30.8 02/06/2024 1202   MCH 29.4 05/23/2023 1216   MCHC 32.1 02/06/2024 1202   MCHC 30.9 05/23/2023 1216   RDW 13.1 02/06/2024 1202   LYMPHSABS 1.3 02/06/2024 1202   MONOABS 0.9 04/22/2023 1135   EOSABS 0.3 02/06/2024 1202   BASOSABS 0.1 02/06/2024 1202    Lab Results  Component Value Date   HGBA1C 5.2 02/06/2024    Lab Results  Component Value Date   TSH 0.802 02/06/2024       Assessment & Plan Essential hypertension Blood pressure elevated at home and office.  -Blood pressure slightly improved upon repeat but still above goal- Prescribed amlodipine  2.5 mg daily. - Instructed to monitor blood pressure at home and report dizziness or lightheadedness. - Scheduled follow-up in 3 months to assess response to medication. -Counseled on blood pressure goal of less than 130/80, low-sodium, DASH diet, medication compliance, 150 minutes of moderate intensity exercise per week. Discussed medication compliance, adverse effects.   Weight loss Weight loss from 100 to 90 pounds over 4 weeks. No appetite or dietary changes. Previous thyroid  tests normal. Recent CT  and mammogram negative for malignancy. Differential includes thyroid  dysfunction and malignancy. - Ordered CT scan of abdomen and pelvis to rule out abdominal masses. - Ordered blood tests for pancreatic cancer markers. - Monitor weight and appetite.  Chronic obstructive pulmonary disease and chronic respiratory failure with hypoxia Continues on Breo Ellipta  and Spiriva  Respimat inhalers. Oxygen  therapy at 3 L/min.  Allergic rhinitis  Continues loratadine  and flonase  for allergy management. - Refilled loratadine  and flonase  prescriptions.    Thyroid  nodule Previously biopsied nodule, stable from ultrasound report    Meds ordered this encounter  Medications   fluticasone  (FLONASE ) 50 MCG/ACT nasal spray    Sig: Place 2 sprays into both nostrils daily.    Dispense:  16 g    Refill:  6   loratadine  (CLARITIN ) 10 MG tablet    Sig: Take 1 tablet (10 mg total) by mouth daily.    Dispense:  100 tablet    Refill:  3   amLODipine  (NORVASC ) 2.5 MG tablet    Sig: Take 1 tablet (2.5 mg total) by mouth daily.    Dispense:  90 tablet    Refill:  1    Follow-up: Return in about 3 months (around 11/18/2024).       Corrina Sabin, MD, FAAFP. [facility] [place], [place] [phone]   08/18/2024, 1:17 PM

## 2024-08-19 ENCOUNTER — Telehealth: Payer: Self-pay

## 2024-08-19 ENCOUNTER — Ambulatory Visit: Payer: Self-pay | Admitting: Family Medicine

## 2024-08-19 LAB — CMP14+EGFR
ALT: 5 IU/L (ref 0–32)
AST: 14 IU/L (ref 0–40)
Albumin: 4.2 g/dL (ref 3.8–4.8)
Alkaline Phosphatase: 86 IU/L (ref 49–135)
BUN/Creatinine Ratio: 21 (ref 12–28)
BUN: 11 mg/dL (ref 8–27)
Bilirubin Total: <0.2 mg/dL (ref 0.0–1.2)
CO2: 26 mmol/L (ref 20–29)
Calcium: 9.5 mg/dL (ref 8.7–10.3)
Chloride: 102 mmol/L (ref 96–106)
Creatinine, Ser: 0.52 mg/dL — ABNORMAL LOW (ref 0.57–1.00)
Globulin, Total: 3 g/dL (ref 1.5–4.5)
Glucose: 77 mg/dL (ref 70–99)
Potassium: 4 mmol/L (ref 3.5–5.2)
Sodium: 143 mmol/L (ref 134–144)
Total Protein: 7.2 g/dL (ref 6.0–8.5)
eGFR: 97 mL/min/1.73 (ref >59)

## 2024-08-19 LAB — CA 19-9 (SERIAL): CA 19-9: 18 U/mL (ref 0–35)

## 2024-08-19 NOTE — Telephone Encounter (Signed)
 PCS form has been completed and faxed to [email or web address]

## 2024-08-20 ENCOUNTER — Telehealth: Payer: Self-pay | Admitting: *Deleted

## 2024-08-20 NOTE — Telephone Encounter (Signed)
 I believe call was placed to schedule CT for Dec 2025.   Orders: Low dose CT chest in 2 months Sputum culture if able  Copied from CRM #[id number]. Topic: Clinical - Lab/Test Results >> Aug 19, 2024  9:11 AM Benton KIDD wrote: Reason for CRM: patient returning call from [facility] concerning ct results . This call was not from us   Please give patient a call back concerning results [phone]

## 2024-08-20 NOTE — Telephone Encounter (Signed)
 Spoke to the patient  aware of appt

## 2024-08-24 ENCOUNTER — Encounter: Payer: Self-pay | Admitting: Family Medicine

## 2024-08-26 ENCOUNTER — Telehealth: Payer: Self-pay

## 2024-08-26 NOTE — Telephone Encounter (Signed)
 Copied from CRM [id number]. Topic: General - Call Back - No Documentation >> Aug 26, 2024  4:37 PM Tonda B wrote: Reason for CRM: [facility] calling has question about pt appt please call back [phone] ext [phone]

## 2024-08-27 NOTE — Telephone Encounter (Signed)
 Copied from CRM [id number]. Topic: General - Call Back - No Documentation >> Aug 27, 2024  1:52 PM Margaret Henry wrote:  Reason for CRM:  Pt needs untrasound to be done at [facility] for her abdomen. This was sent to [place] [facility] but needs ot be sent to [facility] to have appt. And pt stated she got a reply that it was corrected. Cb [phone]

## 2024-08-27 NOTE — Telephone Encounter (Signed)
 Prior shara has been started for patient MRI.

## 2024-08-28 ENCOUNTER — Encounter: Payer: Self-pay | Admitting: Family Medicine

## 2024-08-30 ENCOUNTER — Encounter (HOSPITAL_BASED_OUTPATIENT_CLINIC_OR_DEPARTMENT_OTHER): Payer: Self-pay

## 2024-08-30 ENCOUNTER — Ambulatory Visit (HOSPITAL_BASED_OUTPATIENT_CLINIC_OR_DEPARTMENT_OTHER)

## 2024-09-06 ENCOUNTER — Ambulatory Visit (HOSPITAL_BASED_OUTPATIENT_CLINIC_OR_DEPARTMENT_OTHER)
Admission: RE | Admit: 2024-09-06 | Discharge: 2024-09-06 | Disposition: A | Discharge status: Home / Self Care | Source: Ambulatory Visit | Attending: Family Medicine | Admitting: Family Medicine

## 2024-09-06 DIAGNOSIS — K8020 Calculus of gallbladder without cholecystitis without obstruction: Secondary | ICD-10-CM | POA: Diagnosis not present

## 2024-09-06 DIAGNOSIS — R634 Abnormal weight loss: Secondary | ICD-10-CM | POA: Insufficient documentation

## 2024-09-06 DIAGNOSIS — K8050 Calculus of bile duct without cholangitis or cholecystitis without obstruction: Secondary | ICD-10-CM | POA: Diagnosis not present

## 2024-09-06 DIAGNOSIS — K5730 Diverticulosis of large intestine without perforation or abscess without bleeding: Secondary | ICD-10-CM | POA: Diagnosis not present

## 2024-09-06 MED ORDER — IOHEXOL 300 MG/ML  SOLN
100.0000 mL | Freq: Once | INTRAMUSCULAR | Status: AC | PRN
  Administered 2024-09-06: 60 mL via INTRAVENOUS

## 2024-09-07 ENCOUNTER — Other Ambulatory Visit: Payer: Self-pay | Admitting: Pharmacist

## 2024-09-07 NOTE — Progress Notes (Signed)
 Pharmacy Quality Measure Review  This patient is appearing on a report for being at risk of failing the adherence measure for cholesterol (statin) medications this calendar year.   Medication: atorvastatin  Last fill date: 06/11/2024 for 90 day supply  Call placed to [facility] and authorized a refill.   Herlene Fleeta Morris, PharmD, JAQUELINE, CPP Clinical Pharmacist [facility] & [facility] [phone]

## 2024-09-16 ENCOUNTER — Other Ambulatory Visit: Payer: Self-pay | Admitting: Pharmacist

## 2024-09-16 NOTE — Progress Notes (Signed)
 Pharmacy Quality Measure Review  This patient is appearing on a report for being at risk of failing the adherence measure for cholesterol (statin) medications this calendar year.   Medication: atorvastatin  Last fill date: 09/09/2024 for 90 day supply  Call placed to [facility]. Medication was shipped 09/09/2024. No follow-up needed at this time.  Herlene Fleeta Morris, PharmD, JAQUELINE, CPP Clinical Pharmacist [facility] [phone]

## 2024-09-20 ENCOUNTER — Ambulatory Visit (HOSPITAL_BASED_OUTPATIENT_CLINIC_OR_DEPARTMENT_OTHER)
Admission: RE | Admit: 2024-09-20 | Discharge: 2024-09-20 | Disposition: A | Discharge status: Home / Self Care | Source: Ambulatory Visit | Attending: Acute Care | Admitting: Acute Care

## 2024-09-20 DIAGNOSIS — R918 Other nonspecific abnormal finding of lung field: Secondary | ICD-10-CM

## 2024-09-28 ENCOUNTER — Telehealth: Payer: Self-pay

## 2024-09-28 NOTE — Telephone Encounter (Signed)
 Called patient to review recent Lung CT results. No answer, LVM to return call.

## 2024-09-28 NOTE — Telephone Encounter (Addendum)
 Spoke with patient and reviewed recent Lung CT results. Pt states she has had no recent illness. States she has had a cough for years and she regularly coughs up green phlegm. Advised provider will review results and give recommendations on follow up scan.   I have reviewed the patient's scan. She has definite changes to her scan compared to previous scans. She needs follow up in the office with Landry Ferrari NP to assess for antibiotic therapy, and then a 2 moth follow up scan to reassesses.

## 2024-09-28 NOTE — Telephone Encounter (Signed)
 See if she can see me on 12/30 or first available opening

## 2024-09-29 ENCOUNTER — Other Ambulatory Visit: Payer: Self-pay

## 2024-09-29 DIAGNOSIS — Z122 Encounter for screening for malignant neoplasm of respiratory organs: Secondary | ICD-10-CM

## 2024-09-29 DIAGNOSIS — Z87891 Personal history of nicotine dependence: Secondary | ICD-10-CM

## 2024-09-29 DIAGNOSIS — R911 Solitary pulmonary nodule: Secondary | ICD-10-CM

## 2024-09-29 NOTE — Telephone Encounter (Signed)
 ATC x1. LMTCB.  Front staff can you please try to schedule the pt 12/30.

## 2024-09-29 NOTE — Telephone Encounter (Signed)
 Pt is scheduled to see Landry Ferrari, NP on 10-06-24. NFN

## 2024-10-06 ENCOUNTER — Telehealth: Payer: Self-pay | Admitting: Primary Care

## 2024-10-06 ENCOUNTER — Ambulatory Visit (INDEPENDENT_AMBULATORY_CARE_PROVIDER_SITE_OTHER): Admitting: Primary Care

## 2024-10-06 ENCOUNTER — Other Ambulatory Visit (HOSPITAL_BASED_OUTPATIENT_CLINIC_OR_DEPARTMENT_OTHER): Payer: Self-pay

## 2024-10-06 ENCOUNTER — Encounter: Payer: Self-pay | Admitting: Primary Care

## 2024-10-06 VITALS — BP 118/68 | HR 96 | Temp 97.4°F | Ht 64.0 in | Wt 99.0 lb

## 2024-10-06 DIAGNOSIS — J4489 Other specified chronic obstructive pulmonary disease: Secondary | ICD-10-CM | POA: Diagnosis not present

## 2024-10-06 DIAGNOSIS — Z79891 Long term (current) use of opiate analgesic: Secondary | ICD-10-CM

## 2024-10-06 DIAGNOSIS — J9611 Chronic respiratory failure with hypoxia: Secondary | ICD-10-CM | POA: Diagnosis not present

## 2024-10-06 DIAGNOSIS — J439 Emphysema, unspecified: Secondary | ICD-10-CM | POA: Diagnosis not present

## 2024-10-06 DIAGNOSIS — Z9981 Dependence on supplemental oxygen: Secondary | ICD-10-CM

## 2024-10-06 DIAGNOSIS — R911 Solitary pulmonary nodule: Secondary | ICD-10-CM

## 2024-10-06 DIAGNOSIS — Z79899 Other long term (current) drug therapy: Secondary | ICD-10-CM

## 2024-10-06 MED ORDER — AZITHROMYCIN 250 MG PO TABS
250.0000 mg | ORAL_TABLET | Freq: Every day | ORAL | 3 refills | Status: AC
  Filled 2024-10-06: qty 30, 30d supply, fill #0

## 2024-10-06 MED ORDER — PREDNISONE 20 MG PO TABS
20.0000 mg | ORAL_TABLET | Freq: Every day | ORAL | 0 refills | Status: AC
  Filled 2024-10-06: qty 5, 5d supply, fill #0

## 2024-10-06 NOTE — Progress Notes (Addendum)
 "  @Patient  ID: Margaret Henry, female    DOB: [birth year], 73 y.o.   MRN: [id number]  Chief Complaint  Patient presents with   Medical Management of Chronic Issues    Azithromycin  f/u. Antibiotic therapy     Referring provider: Delbert Clam, MD  HPI: 73 year old female, former smoker. PMH significant for CAD, mild aortic insufficiency, COPD, allergic rhinitis, lung nodule, GERD, goiter, osteoporosis, OAB, hyperlipidemia, iron deficiency anemia, vit D deficiency, covid-19.   Previous [facility] pulmonary encounter:  07/20/2024 Discussed the use of AI scribe software for clinical note transcription with the patient, who gave verbal consent to proceed.  History of Present Illness Margaret Henry is a 73 year old female with chronic respiratory issues who presents for follow-up after recent CT scans.  She underwent CT scans on October 1st, 2025, which revealed lung nodules with waxing and waning consolidation in the right upper lobe. A repeat CT scan is scheduled in two months for comparison.  She has experienced a chronic cough for three years, producing green mucus, occurring four to five times a week. She does not take medication specifically for the cough. She uses Breo and Spiriva  inhalers daily and has tried a nebulizer with Ohtuvayre , which she feels has not made a difference. She experiences shortness of breath and uses oxygen  at three liters continuously.  She recently completed a four-week course of physical therapy aimed at improving mobility, which included walking and exercises. She uses a walker at home as needed but can walk around the house without it most of the time. She experiences significant shortness of breath with minimal exertion, such as walking from the bathroom to the couch, causing her oxygen  levels to drop to 86%.  She has a history of osteoporosis, affecting her treatment options, particularly with medications like prednisone . She has been on azithromycin  three times a  week and previously took it daily, which she felt was more effective. She has tried hypertonic saline nebulizer and a flutter valve for mucus clearance but did not find them helpful. She has not had a breathing test since July 2017.  A calcified right thyroid  nodule was noted on the CT scan, previously biopsied in 2010 and found to be clear.  Chronic obstructive pulmonary disease with chronic respiratory failure  Persistent cough with green sputum for three years, not associated with acute bronchitis or pneumonia. Shortness of breath with oxygen  saturation dropping to 86% upon exertion. Current treatment includes Breo, Spiriva , and Ohtuvayre  nebulizer, with limited improvement. Albuterol  nebulizer previously provided more relief. Mucinex  not tolerated due to choking. Prednisone  not preferred due to osteoporosis. Azithromycin  three times a week, previously daily, with better outcomes when taken daily. No acute infection on examination. Sputum culture desired but not feasible. Bronchoscopy considered high risk due to chronic respiratory failure. - Resume albuterol  nebulizer two to three times a day. - Discontinue Ohtuvayre  nebulizer due lack benefit and cost - Increase oxygen  to four liters with exertion if needed. - Use Robitussin as needed for cough. - Use flutter valve and hypertonic saline nebulizer if congestion is present. - Switch Azithromycin  250mcg to daily dosing - Perform EKG at follow-up to monitor for cardiac abnormalities. - Provide sputum cup for potential sample collection.  Right upper lobe pulmonary nodularity and consolidation (inflammatory/infectious) Right upper lobe pulmonary nodularity and waxing and waning consolidation, likely representing an inflammatory or infectious process rather than malignancy. No acute findings on recent CT scan. Repeat CT scan planned for comparison. - Order repeat  CT scan in two months. - Consider bronchoscopy if CT scan remains inconclusive and  sputum sample is not obtainable. - Bronchoscopy considered high risk due to chronic respiratory failure.     10/06/2024 Discussed the use of AI scribe software for clinical note transcription with the patient, who gave verbal consent to proceed.  History of Present Illness Margaret Henry is a 73 year old female with COPD and emphysema who presents for follow-up after a CT scan with the lung cancer screening program. Her son is present during the visit.  She is a former smoker who quit in 2020 after smoking off and on for over fifty years, never exceeding a pack a day. The recent CT scan in December with lung cancer screening program showed lobular and paraseptal emphysema, with waxing and waning areas of nodular consolidation in the lungs bilaterally. There was interval improvement of some previously seen lesions and multiple new areas of nodular consolidation primarily in the left upper and lower lobes.  She has a persistent cough with green mucus, present for three years, which has recently become more frequent with mucus production occurring three to four times a day. The amount of sputum is minimal, and she has not been able to provide a sputum sample. No fever or night sweats.  She is currently on azithromycin  250 mg daily, Breo, and Spiriva  for her COPD and emphysema. She discontinued the use of Ohtuvayre  nebulizer (PDE4 inhibitor) due to headaches and is on home oxygen  at 3.5 liters. She has aides that assist her at home.  She experiences constant pain in her left shoulder blade, which sometimes worsens with breathing. Her left arm has also been hurting recently. No recent hospitalizations.    Allergies[1]  Immunization History  Administered Date(s) Administered   Fluad  Quad(high Dose 65+) 07/20/2019, 07/12/2021, 07/04/2022   Fluad  Trivalent(High Dose 65+) 06/20/2023   INFLUENZA, HIGH DOSE SEASONAL PF 07/08/2024   Influenza Split 07/07/2013, 07/05/2016   Influenza Whole 11/09/2009    Influenza,inj,Quad PF,6+ Mos 06/24/2015, 07/03/2017, 06/19/2018   PNEUMOCOCCAL CONJUGATE-20 06/20/2023   Pneumococcal Conjugate-13 09/26/2015   Pneumococcal Polysaccharide-23 10/08/1998, 07/08/2018   Respiratory Syncytial Virus Vaccine ,Recomb Aduvanted(Arexvy ) 07/05/2022   Td 10/08/2002   Td (Adult), 2 Lf Tetanus Toxid, Preservative Free 10/08/2002   Tdap 09/10/2016    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)   Anal intraepithelial neoplasia II (AIN II) 2017   Anemia 04/06/22   Aortic atherosclerosis    B12 deficiency    Blood transfusion without reported diagnosis 6/23   Bronchitis    Cancer (HCC)    skin cancer on chest   Cataract 5/23   Chest pain 06/12/2016   Cholelithiasis    Colon polyps    Complication of anesthesia 05/16/2021   pt states was given too much during nasal surgery 1989; difficulty getting awake   COPD (chronic obstructive pulmonary disease) (HCC)    Coronary artery calcification    Diverticulitis 05/16/2021   occasional   Diverticulosis    Dyspnea 06/27/2016   Emphysema of lung (HCC)    External hemorrhoids    GERD (gastroesophageal reflux disease)    occasional   Hemorrhoids    High cholesterol    Hyperlipidemia LDL goal <70    Iron deficiency    Mild aortic insufficiency    Neuropathy 04/07/2022   Osteoporosis    Oxygen  deficiency  Pneumonia    Shortness of breath dyspnea    with exertion    Thyroid  goiter    bx benign   Tobacco abuse    Vertigo 04/21/2017    Tobacco History: Tobacco Use History[2] Counseling given: Not Answered   Outpatient Medications Prior to Visit  Medication Sig Dispense Refill   albuterol  (PROVENTIL ) (2.5 MG/3ML) 0.083% nebulizer solution Take 2.5 mg by nebulization every 6 (six) hours as needed for wheezing or shortness of breath.     amLODipine  (NORVASC ) 2.5 MG tablet Take 1 tablet (2.5 mg total) by mouth  daily. 90 tablet 1   atorvastatin  (LIPITOR) 40 MG tablet Take 1 tablet (40 mg total) by mouth daily. 90 tablet 3   azithromycin  (ZITHROMAX ) 250 MG tablet Take 1 tablet (250 mg total) by mouth daily. 30 tablet 1   bismuth subsalicylate (PEPTO BISMOL) 262 MG/15ML suspension Take 30 mLs by mouth every 6 (six) hours as needed for indigestion.     BREO ELLIPTA  200-25 MCG/ACT AEPB INHALE 1 PUFF EVERY DAY (Patient taking differently: Inhale 1 puff into the lungs daily.) 180 each 3   ferrous sulfate  325 (65 FE) MG EC tablet Take 325 mg by mouth every other day. Pt states she is only taking once a week due to constipation     fluticasone  (FLONASE ) 50 MCG/ACT nasal spray Place 2 sprays into both nostrils daily. 16 g 6   loratadine  (CLARITIN ) 10 MG tablet Take 1 tablet (10 mg total) by mouth daily. 100 tablet 3   nitroGLYCERIN  (NITROSTAT ) 0.4 MG SL tablet Place 1 tablet (0.4 mg total) under the tongue every 5 (five) minutes as needed for chest pain. 25 tablet 6   OXYGEN  Inhale 3 L/min into the lungs continuous.     SPIRIVA  RESPIMAT 2.5 MCG/ACT AERS INHALE 2 PUFFS EVERY DAY 12 g 3   No facility-administered medications prior to visit.   Review of Systems  Review of Systems  Constitutional:  Positive for fatigue.  Respiratory:  Positive for cough and shortness of breath.    Physical Exam  BP 118/68   Pulse 96   Temp (!) 97.4 F (36.3 C)   Ht 5' 4 (1.626 m) Comment: PT STATED  Wt 99 lb (44.9 kg) Comment: PT STATED  SpO2 95% Comment: 3l o2 cont  BMI 16.99 kg/m  Physical Exam Constitutional:      General: She is not in acute distress.    Appearance: Normal appearance. She is not ill-appearing.  HENT:     Head: Normocephalic and atraumatic.  Cardiovascular:     Rate and Rhythm: Normal rate and regular rhythm.  Pulmonary:     Effort: Pulmonary effort is normal.     Breath sounds: No wheezing, rhonchi or rales.     Comments: No wheezing, rhonchi or rales heard on exam. Wearing 3L  O2 Musculoskeletal:     Cervical back: Normal range of motion and neck supple.  Skin:    General: Skin is warm and dry.  Neurological:     General: No focal deficit present.     Mental Status: She is alert and oriented to person, place, and time. Mental status is at baseline.  Psychiatric:        Behavior: Behavior normal.        Thought Content: Thought content normal.        Judgment: Judgment normal.     Lab Results:  CBC    Component Value Date/Time   WBC 8.9 02/06/2024 1202  WBC 11.1 (H) 05/23/2023 1216   RBC 3.96 02/06/2024 1202   RBC 3.85 (L) 05/23/2023 1216   HGB 12.2 02/06/2024 1202   HCT 38.0 02/06/2024 1202   PLT 299 02/06/2024 1202   MCV 96 02/06/2024 1202   MCH 30.8 02/06/2024 1202   MCH 29.4 05/23/2023 1216   MCHC 32.1 02/06/2024 1202   MCHC 30.9 05/23/2023 1216   RDW 13.1 02/06/2024 1202   LYMPHSABS 1.3 02/06/2024 1202   MONOABS 0.9 04/22/2023 1135   EOSABS 0.3 02/06/2024 1202   BASOSABS 0.1 02/06/2024 1202    BMET    Component Value Date/Time   NA 143 08/18/2024 1104   K 4.0 08/18/2024 1104   CL 102 08/18/2024 1104   CO2 26 08/18/2024 1104   GLUCOSE 77 08/18/2024 1104   GLUCOSE 89 05/29/2023 1128   BUN 11 08/18/2024 1104   CREATININE 0.52 (L) 08/18/2024 1104   CREATININE 0.60 09/05/2016 1218   CALCIUM  9.5 08/18/2024 1104   GFRNONAA >60 05/29/2023 1128   GFRNONAA >89 03/26/2016 1022   GFRAA 109 11/18/2020 1201   GFRAA >89 03/26/2016 1022    BNP    Component Value Date/Time   BNP 61.4 05/21/2023 1616    ProBNP No results found for: PROBNP  Imaging: CT CHEST LCS NODULE F/U LOW DOSE WO CONTRAST Result Date: 09/27/2024 CLINICAL DATA:  Lung nodules. Previous lung rads 0 exam. Former 50 pack-year smoker, quit 5 years ago. EXAM: CT CHEST WITHOUT CONTRAST FOR LUNG CANCER SCREENING NODULE FOLLOW-UP TECHNIQUE: Multidetector CT imaging of the chest was performed following the standard protocol without IV contrast. RADIATION DOSE REDUCTION:  This exam was performed according to the departmental dose-optimization program which includes automated exposure control, adjustment of the mA and/or kV according to patient size and/or use of iterative reconstruction technique. COMPARISON:  07/08/2024, 05/06/2024. FINDINGS: Cardiovascular: Atherosclerotic calcification of the aorta, aortic valve and coronary arteries. Heart size normal. No pericardial effusion. Mediastinum/Nodes: No pathologically enlarged mediastinal or axillary lymph nodes. Hilar regions are difficult to definitively evaluate without IV contrast. Densely calcified 1.3 cm right thyroid  nodule, as before. Esophagus is grossly unremarkable. Lungs/Pleura: Centrilobular and paraseptal emphysema. Waxing and waning areas of nodular consolidation in the lungs bilaterally, with interval improvement of some previously seen lesions as well as multiple new areas of nodular consolidation, primarily in the posterior left upper lobe and left lower lobe. No pleural fluid. Debris in the airway. Upper Abdomen: Gallstones. Prominent right extrarenal pelvis, incompletely visualized. Visualized portions of the liver, gallbladder, adrenal glands, kidneys, spleen, pancreas, stomach and bowel are otherwise grossly unremarkable. No upper abdominal adenopathy. Musculoskeletal: Osteopenia. Degenerative changes in the spine. Old T8, T11, T12 and L1 compression fractures. IMPRESSION: 1. Waxing and waning areas of nodular consolidation, findings which favor an ongoing atypical or fungal infectious process. Patient may be a poor candidate for lung cancer screening. Lung-RADS 0, incomplete. Recommend follow-up low-dose lung cancer screening CC in 2-3 months after appropriate clinical therapy. 2. Cholelithiasis. 3. Aortic atherosclerosis (ICD10-I70.0). Coronary artery calcification. 4.  Emphysema (ICD10-J43.9). Electronically Signed   By: Newell Eke M.D.   On: 09/27/2024 17:27   CT ABDOMEN PELVIS W CONTRAST Result Date:  09/12/2024 EXAM: CT ABDOMEN AND PELVIS WITH CONTRAST 09/06/2024 01:00:07 PM TECHNIQUE: CT of the abdomen and pelvis was performed with the administration of intravenous contrast. Multiplanar reformatted images are provided for review. Automated exposure control, iterative reconstruction, and/or weight-based adjustment of the mA/kV was utilized to reduce the radiation dose to as low as reasonably  achievable. CONTRAST: 60 mL Omnipaque  300. COMPARISON: CT Abdomen Pelvis 05/21/2023. CLINICAL HISTORY: weight loss FINDINGS: LOWER CHEST: Emphysematous changes. Coronary artery calcification. LIVER: The liver is unremarkable. GALLBLADDER AND BILE DUCTS: Calcified stones within the gallbladder lumen. No gallstones are identified. No gallbladder wall thickening or pericholecystic fluid is noted. No biliary ductal dilatation. SPLEEN: Punctate calcifications of the spleen. Likely sequelae of prior granulomatous disease. PANCREAS: No acute abnormality. ADRENAL GLANDS: No acute abnormality. KIDNEYS, URETERS AND BLADDER: Extrarenal prominent right renal pelvis. No hydronephrosis. No hydroureteronephrosis bilaterally. Urothelial thickening bilaterally of the ureters. No nephroureterolithiasis. No perinephric or periureteral stranding. Urinary bladder is unremarkable. GI AND BOWEL: Stomach demonstrates no acute abnormality. Colonic diverticulosis. No small or large bowel thickening or dilatation. The appendix is unremarkable. There is no bowel obstruction. PERITONEUM AND RETROPERITONEUM: No ascites. No free air. VASCULATURE: Aorta is normal in caliber. Severe atherosclerotic plaque. LYMPH NODES: No lymphadenopathy. REPRODUCTIVE ORGANS: No acute abnormality. BONES AND SOFT TISSUES: Diffusely decreased bone density. Multilevel thoracolumbar vertebral body height loss and compression fractures. No focal soft tissue abnormality. IMPRESSION: 1. No acute process identified in the abdomen or pelvis to account for weight loss. 2. Urothelial  thickening - correlate with urinalysis for infection. 3. Cholelithiasis with no CT evidence of acute cholecystitis. 4. Colonic diverticulosis with no acute diverticulitis. 5. Severe atherosclerotic plaque with associated coronary artery calcification . 6. Sequelae of prior granulomatous disease. 7. Other, non-acute and/or normal findings as above. Electronically signed by: Morgane Naveau MD 09/12/2024 09:13 PM EST RP Workstation: [id number]     Assessment & Plan:    1. Chronic respiratory failure with hypoxia (HCC) (Primary)  2. Lung nodule  3. COPD with chronic bronchitis and emphysema (HCC) - EKG 12-Lead  4. Medication management - EKG 12-Lead    Assessment and Plan Assessment & Plan Chronic obstructive pulmonary disease with chronic respiratory failure and hypoxia Chronic respiratory failure and hypoxia managed with home oxygen  therapy at 3.5 L/min. No recent hospitalizations. Persistent dyspnea and fatigue. Associated cough with minimal green sputum for three years, no significant change on daily azithromycin .  Discussed potential use of biologics like Dupixent to reduce COPD exacerbations. Risks of bronchoscopy include infection, bleeding, pneumothorax, and respiratory failure due to sedation. Biologics may lower immune system and cause site reactions, eye problems, and other side effects. - Continue Breo 200mcg and Spiriva  daily. - Continue 3-3.5L supplemental oxygen  continuously to maintain O2 >88-90% - Performed EKG to monitor QTc interval due to azithromycin  use. - Renewed azithromycin  prescription 250mcg daily. - Consulted with Dr. Theophilus regarding potential use of biologics like Dupixent. - Rx short-term prednisone  for flare-ups, avoiding daily use due to osteoporosis risk.   Medication management - QTC <470 and stable compared to May 2025 - Ok to continue daily azithromycin    Pulmonary nodules, waxing and waning Waxing and waning nodules in the right upper lobe, likely  due to inflammatory or infectious process rather than malignancy. Recent CT scan shows interval improvement and new nodular consolidation in the left upper and lower lobes. No significant symptoms of active infection. Discussed potential aspiration as a cause of nodular areas. Concerns about bronchoscopy due to respiratory failure and sedation risks. - Consulted with Dr. Theophilus regarding the need for a bronchoscopy vs swallow study to assess for aspiration.  Risk for aspiration Potential risk for aspiration due to debris in the airway. No frequent choking or coughing after eating, but occasional episodes noted. Discussed the possibility of a swallow study to evaluate for aspiration. - Will consider  swallow study to evaluate for aspiration.  I personally spent a total of 40 minutes in the care of the patient today including counseling and educating, placing orders, referring and communicating with other health care professionals, independently interpreting results, and coordinating care.   Almarie LELON Ferrari, NP 10/06/2024     [1]  Allergies Allergen Reactions   Hydrocodone  Nausea And Vomiting   Propoxyphene N-Acetaminophen  Nausea And Vomiting   Augmentin  [Amoxicillin -Pot Clavulanate] Rash  [2]  Social History Tobacco Use  Smoking Status Former   Current packs/day: 0.00   Average packs/day: 1.5 packs/day for 50.0 years (75.0 ttl pk-yrs)   Types: Cigarettes   Start date: 11/03/1968   Quit date: 11/03/2018   Years since quitting: 5.9  Smokeless Tobacco Never   "

## 2024-10-06 NOTE — Patient Instructions (Signed)
" °  VISIT SUMMARY: Today, you had a follow-up appointment to discuss the results of your recent CT scan and ongoing management of your COPD, emphysema, and other related issues. Your son was present during the visit. We reviewed your current symptoms, medications, and discussed potential new treatments.  YOUR PLAN: -CHRONIC OBSTRUCTIVE PULMONARY DISEASE (COPD) WITH CHRONIC RESPIRATORY FAILURE AND HYPOXIA: COPD is a chronic lung disease that makes it hard to breathe. You are managing this with home oxygen  therapy at 3.5 liters per minute. We renewed your azithromycin  prescription and performed an EKG to monitor your heart due to this medication. We also discussed the potential use of biologics like Dupixent to reduce flare-ups, though these come with risks such as immune suppression and site reactions. We will consider short-term prednisone  for flare-ups, but avoid daily use due to the risk of osteoporosis.  EKG looked fine, safe to continue daily azithromycin    -EMPHYSEMA WITH CHRONIC BRONCHITIS: Emphysema is a type of COPD that damages the air sacs in your lungs, and chronic bronchitis involves long-term inflammation of the airways. You have a persistent cough with green mucus. We discussed continuing your current medications, Breo and Spiriva , and the potential use of biologics like Dupixent to reduce flare-ups.  -PULMONARY NODULES, WAXING AND WANING: Pulmonary nodules are small growths in the lungs. Your recent CT scan showed some improvement in previous nodules but also new areas of concern. These are likely due to inflammation or infection rather than cancer. We discussed the possibility of aspiration (inhaling food or liquid into the lungs) as a cause and consulted with Dr. Theophilus about the need for bronchoscopy or swallow study to assess this.  -RISK FOR ASPIRATION: Aspiration occurs when food or liquid accidentally enters the airway. You have occasional episodes of coughing after eating. We  discussed the possibility of a swallow study to evaluate this risk.  INSTRUCTIONS: Please continue your current medications as prescribed. We will follow up with Dr. Patrcia regarding the potential use of biologics and the need for a swallow study. If you experience any new or worsening symptoms, please contact our office immediately.  Follow-up 3 months with Dr. Theophilus   "

## 2024-10-06 NOTE — Addendum Note (Signed)
 Addended by: HOPE ALMARIE ORN on: 10/06/2024 09:59 AM   Modules accepted: Orders

## 2024-10-06 NOTE — Telephone Encounter (Signed)
 Patient of yours with waxing and waning nodules bilaterally indicative of ongoing atypical or fungal process with new appears left upper and left lower lobe since October. She has COPD with chronic bronchitis and emphysema along with chronic respiratory failure. She is on daily azithromycin  250mcg. She did not tolerate Ohtuvayre . Overall she feels well. Her main complaint is dyspnea and fatigue. She has had a chronic cough with minimal purulent mucus production, has not been able to give a sputum sample in the past.   How do you want to manage? Do you want to consider bronchoscopy (patient higher risk due to respiratory failure), add on dupixent or nucala to reduce COPD flare ups (eos were 300 in May) or gets modified barium swallow to assess for aspiration?

## 2024-10-20 NOTE — Telephone Encounter (Signed)
 Called and spoke with patient, advised of information from Almarie Ferrari NP.  She verbalized understanding.  Nothing further needed.

## 2024-10-20 NOTE — Telephone Encounter (Signed)
 Sorry for the late reply as the message got lost in my inbox I think bronchoscopy is high risk.   We can consider Dupixent and swallow eval.  I will address at scheduled follow-up visit.

## 2024-10-20 NOTE — Telephone Encounter (Signed)
 Please let patient know I spoke with Dr. Theophilus following her visit at the end of December, he agrees bronchoscopy is high risk. He will consider adding dupixent and getting swallow eval at her follow-up in March

## 2024-11-06 ENCOUNTER — Encounter: Payer: Self-pay | Admitting: Acute Care

## 2024-11-19 ENCOUNTER — Ambulatory Visit: Admitting: Family Medicine

## 2024-12-12 ENCOUNTER — Encounter (HOSPITAL_BASED_OUTPATIENT_CLINIC_OR_DEPARTMENT_OTHER)

## 2024-12-28 ENCOUNTER — Ambulatory Visit: Admitting: Pulmonary Disease

## 2025-03-16 ENCOUNTER — Ambulatory Visit
